# Patient Record
Sex: Female | Born: 1935 | Race: White | Hispanic: No | Smoking: Never smoker
Health system: Southern US, Community
[De-identification: ages and names within clinical notes are randomized; demographics above are authoritative.]

## PROBLEM LIST (undated history)

## (undated) DIAGNOSIS — F32A Depression, unspecified: Secondary | ICD-10-CM

## (undated) DIAGNOSIS — Z974 Presence of external hearing-aid: Secondary | ICD-10-CM

## (undated) DIAGNOSIS — I10 Essential (primary) hypertension: Secondary | ICD-10-CM

## (undated) DIAGNOSIS — I8390 Asymptomatic varicose veins of unspecified lower extremity: Secondary | ICD-10-CM

## (undated) DIAGNOSIS — F329 Major depressive disorder, single episode, unspecified: Secondary | ICD-10-CM

## (undated) DIAGNOSIS — Z972 Presence of dental prosthetic device (complete) (partial): Secondary | ICD-10-CM

## (undated) DIAGNOSIS — E039 Hypothyroidism, unspecified: Secondary | ICD-10-CM

## (undated) DIAGNOSIS — I5189 Other ill-defined heart diseases: Secondary | ICD-10-CM

## (undated) DIAGNOSIS — H3530 Unspecified macular degeneration: Secondary | ICD-10-CM

## (undated) DIAGNOSIS — M1990 Unspecified osteoarthritis, unspecified site: Secondary | ICD-10-CM

## (undated) DIAGNOSIS — I351 Nonrheumatic aortic (valve) insufficiency: Secondary | ICD-10-CM

## (undated) DIAGNOSIS — K219 Gastro-esophageal reflux disease without esophagitis: Secondary | ICD-10-CM

## (undated) DIAGNOSIS — I2510 Atherosclerotic heart disease of native coronary artery without angina pectoris: Secondary | ICD-10-CM

## (undated) DIAGNOSIS — D649 Anemia, unspecified: Secondary | ICD-10-CM

## (undated) DIAGNOSIS — E785 Hyperlipidemia, unspecified: Secondary | ICD-10-CM

## (undated) HISTORY — DX: Anemia, unspecified: D64.9

## (undated) HISTORY — PX: HEMORRHOID SURGERY: SHX153

## (undated) HISTORY — DX: Hyperlipidemia, unspecified: E78.5

## (undated) HISTORY — DX: Depression, unspecified: F32.A

## (undated) HISTORY — DX: Nonrheumatic aortic (valve) insufficiency: I35.1

## (undated) HISTORY — PX: VARICOSE VEIN SURGERY: SHX832

## (undated) HISTORY — DX: Essential (primary) hypertension: I10

## (undated) HISTORY — DX: Unspecified macular degeneration: H35.30

## (undated) HISTORY — DX: Asymptomatic varicose veins of unspecified lower extremity: I83.90

## (undated) HISTORY — DX: Major depressive disorder, single episode, unspecified: F32.9

---

## 2004-11-28 ENCOUNTER — Ambulatory Visit: Payer: Self-pay | Admitting: Internal Medicine

## 2004-12-18 ENCOUNTER — Ambulatory Visit: Payer: Self-pay | Admitting: Internal Medicine

## 2005-06-26 ENCOUNTER — Ambulatory Visit: Payer: Self-pay | Admitting: Internal Medicine

## 2005-12-05 ENCOUNTER — Ambulatory Visit: Payer: Self-pay | Admitting: Unknown Physician Specialty

## 2005-12-06 ENCOUNTER — Ambulatory Visit: Payer: Self-pay | Admitting: Internal Medicine

## 2006-01-01 ENCOUNTER — Ambulatory Visit: Payer: Self-pay | Admitting: Unknown Physician Specialty

## 2006-12-10 ENCOUNTER — Ambulatory Visit: Payer: Self-pay | Admitting: Internal Medicine

## 2007-07-15 ENCOUNTER — Other Ambulatory Visit: Payer: Self-pay

## 2007-07-15 ENCOUNTER — Emergency Department: Payer: Self-pay | Admitting: Emergency Medicine

## 2007-07-16 ENCOUNTER — Ambulatory Visit: Payer: Self-pay | Admitting: Internal Medicine

## 2007-12-14 ENCOUNTER — Ambulatory Visit: Payer: Self-pay | Admitting: Internal Medicine

## 2009-01-17 ENCOUNTER — Ambulatory Visit: Payer: Self-pay | Admitting: Internal Medicine

## 2009-04-16 ENCOUNTER — Ambulatory Visit: Payer: Self-pay | Admitting: Family Medicine

## 2010-01-24 ENCOUNTER — Ambulatory Visit: Payer: Self-pay | Admitting: Internal Medicine

## 2011-02-27 ENCOUNTER — Ambulatory Visit (INDEPENDENT_AMBULATORY_CARE_PROVIDER_SITE_OTHER): Payer: MEDICARE | Admitting: Internal Medicine

## 2011-02-27 ENCOUNTER — Encounter: Payer: Self-pay | Admitting: Internal Medicine

## 2011-02-27 VITALS — BP 136/80 | HR 76 | Temp 98.1°F | Resp 16 | Ht 62.0 in | Wt 194.5 lb

## 2011-02-27 DIAGNOSIS — E079 Disorder of thyroid, unspecified: Secondary | ICD-10-CM

## 2011-02-27 DIAGNOSIS — I10 Essential (primary) hypertension: Secondary | ICD-10-CM

## 2011-02-27 DIAGNOSIS — H3530 Unspecified macular degeneration: Secondary | ICD-10-CM | POA: Insufficient documentation

## 2011-02-27 DIAGNOSIS — F32A Depression, unspecified: Secondary | ICD-10-CM | POA: Insufficient documentation

## 2011-02-27 DIAGNOSIS — M549 Dorsalgia, unspecified: Secondary | ICD-10-CM

## 2011-02-27 DIAGNOSIS — Z23 Encounter for immunization: Secondary | ICD-10-CM

## 2011-02-27 DIAGNOSIS — Z79899 Other long term (current) drug therapy: Secondary | ICD-10-CM

## 2011-02-27 DIAGNOSIS — Z1239 Encounter for other screening for malignant neoplasm of breast: Secondary | ICD-10-CM

## 2011-02-27 DIAGNOSIS — F329 Major depressive disorder, single episode, unspecified: Secondary | ICD-10-CM | POA: Insufficient documentation

## 2011-02-27 DIAGNOSIS — E875 Hyperkalemia: Secondary | ICD-10-CM

## 2011-02-27 DIAGNOSIS — E039 Hypothyroidism, unspecified: Secondary | ICD-10-CM

## 2011-02-27 DIAGNOSIS — E785 Hyperlipidemia, unspecified: Secondary | ICD-10-CM

## 2011-02-27 LAB — COMPREHENSIVE METABOLIC PANEL
BUN: 28 mg/dL — ABNORMAL HIGH (ref 6–23)
CO2: 30 mEq/L (ref 19–32)
Calcium: 9.3 mg/dL (ref 8.4–10.5)
Chloride: 102 mEq/L (ref 96–112)
Creatinine, Ser: 0.9 mg/dL (ref 0.4–1.2)
GFR: 65.9 mL/min (ref >60.00)
Total Bilirubin: 0.7 mg/dL (ref 0.3–1.2)

## 2011-02-27 LAB — LDL CHOLESTEROL, DIRECT: Direct LDL: 104.4 mg/dL

## 2011-02-27 MED ORDER — CYCLOBENZAPRINE HCL 10 MG PO TABS: 10.0000 mg | ORAL_TABLET | Freq: Every evening | ORAL | Status: DC | PRN

## 2011-02-27 NOTE — Patient Instructions (Addendum)
Try to start a walking program 25 minutes daily at least 5 days per week.    I will call in a prescription for flexeril for back spasm which you may use at night   We will order your mammogram.  Come back in 6 months for your annual physical.    Take 1/2 of celexa every day for 2 weeks, then 1/2 every other day for 2 weeks then stop  If you can't cut it in half,  Take 1 tablet every other day for 2 weeks, then every 3 days for 2 weeks then stop.

## 2011-02-27 NOTE — Progress Notes (Signed)
  Subjective:    Patient ID: Jenna Marshall, female    DOB: 12/14/1924, 77 y.o.   MRN: [id number]  HPI  77 yo white female with history of hypertension and depression presents for followup .she has been eating out regularly with a group of fellow widows and feels she no longer needs an antidepressant and would like to discontinue it. She has not been able to lose any weight but is not exercising or walking regularly.  No current outpatient prescriptions on file prior to visit.   .  Review of Systems  Constitutional: Negative for fever, chills and unexpected weight change.  HENT: Negative for hearing loss, ear pain, nosebleeds, congestion, sore throat, facial swelling, rhinorrhea, sneezing, mouth sores, trouble swallowing, neck pain, neck stiffness, voice change, postnasal drip, sinus pressure, tinnitus and ear discharge.   Eyes: Negative for pain, discharge, redness and visual disturbance.  Respiratory: Negative for cough, chest tightness, shortness of breath, wheezing and stridor.   Cardiovascular: Negative for chest pain, palpitations and leg swelling.  Musculoskeletal: Negative for myalgias and arthralgias.  Skin: Negative for color change and rash.  Neurological: Negative for dizziness, weakness, light-headedness and headaches.  Hematological: Negative for adenopathy.   BP 136/80  Pulse 76  Temp(Src) 98.1 F (36.7 C) (Oral)  Resp 16  Ht 5\' 2"  (1.575 m)  Wt 194 lb 8 oz (88.225 kg)  BMI 35.57 kg/m2  SpO2 95%     Objective:   Physical Exam  Constitutional: She is oriented to person, place, and time. She appears well-developed and well-nourished.  HENT:  Mouth/Throat: Oropharynx is clear and moist.  Eyes: EOM are normal. Pupils are equal, round, and reactive to light. No scleral icterus.  Neck: Normal range of motion. Neck supple. No JVD present. No thyromegaly present.  Cardiovascular: Normal rate, regular rhythm, normal heart sounds and intact distal pulses.   Pulmonary/Chest:  Effort normal and breath sounds normal.  Abdominal: Soft. Bowel sounds are normal. She exhibits no mass. There is no tenderness.  Musculoskeletal: Normal range of motion. She exhibits no edema.  Lymphadenopathy:    She has no cervical adenopathy.  Neurological: She is alert and oriented to person, place, and time.  Skin: Skin is warm and dry.  Psychiatric: She has a normal mood and affect.          Assessment & Plan:

## 2011-02-27 NOTE — Assessment & Plan Note (Signed)
Controlled on current Synthroid level

## 2011-02-27 NOTE — Assessment & Plan Note (Signed)
Controlled on current regimen.   

## 2011-02-27 NOTE — Assessment & Plan Note (Signed)
Managed with statin. Repeat lipids due.

## 2011-03-01 LAB — COMPREHENSIVE METABOLIC PANEL
ALT: 13 U/L (ref 0–35)
AST: 26 U/L (ref 0–37)
Albumin: 4.1 g/dL (ref 3.5–5.2)
Alkaline Phosphatase: 45 U/L (ref 39–117)
Chloride: 101 mEq/L (ref 96–112)
Potassium: 4.6 mEq/L (ref 3.5–5.3)
Sodium: 137 mEq/L (ref 135–145)
Total Protein: 6.9 g/dL (ref 6.0–8.3)

## 2011-03-01 NOTE — Progress Notes (Signed)
Addended by: Jobie Quaker on: 03/01/2011 03:39 PM   Modules accepted: Orders

## 2011-03-13 ENCOUNTER — Encounter: Payer: Self-pay | Admitting: Internal Medicine

## 2011-03-28 ENCOUNTER — Other Ambulatory Visit: Payer: Self-pay | Admitting: Internal Medicine

## 2011-03-29 MED ORDER — SIMVASTATIN 40 MG PO TABS: 40.0000 mg | ORAL_TABLET | Freq: Every day | ORAL | Status: DC

## 2011-04-03 ENCOUNTER — Other Ambulatory Visit: Payer: Self-pay | Admitting: Internal Medicine

## 2011-04-03 MED ORDER — LEVOTHYROXINE SODIUM 25 MCG PO TABS: 25.0000 ug | ORAL_TABLET | Freq: Every day | ORAL | Status: DC

## 2011-05-05 ENCOUNTER — Ambulatory Visit: Payer: Self-pay | Admitting: Internal Medicine

## 2011-05-23 ENCOUNTER — Ambulatory Visit: Payer: Self-pay | Admitting: Internal Medicine

## 2011-05-30 ENCOUNTER — Other Ambulatory Visit: Payer: Self-pay | Admitting: Internal Medicine

## 2011-05-30 DIAGNOSIS — E538 Deficiency of other specified B group vitamins: Secondary | ICD-10-CM

## 2011-05-30 MED ORDER — CYANOCOBALAMIN 1000 MCG/ML IJ SOLN: 1000.0000 ug | INTRAMUSCULAR | Status: DC

## 2011-07-19 ENCOUNTER — Other Ambulatory Visit: Payer: Self-pay | Admitting: *Deleted

## 2011-07-19 MED ORDER — LOSARTAN POTASSIUM 100 MG PO TABS: 100.0000 mg | ORAL_TABLET | Freq: Every day | ORAL | Status: DC

## 2011-07-25 ENCOUNTER — Other Ambulatory Visit: Payer: Self-pay | Admitting: *Deleted

## 2011-07-25 MED ORDER — SIMVASTATIN 40 MG PO TABS: 40.0000 mg | ORAL_TABLET | Freq: Every day | ORAL | Status: DC

## 2011-07-26 MED ORDER — ALPRAZOLAM 0.25 MG PO TABS: 0.2500 mg | ORAL_TABLET | Freq: Every evening | ORAL | Status: AC | PRN

## 2011-08-01 ENCOUNTER — Other Ambulatory Visit: Payer: Self-pay | Admitting: Internal Medicine

## 2011-08-01 MED ORDER — LEVOTHYROXINE SODIUM 25 MCG PO TABS: 25.0000 ug | ORAL_TABLET | Freq: Every day | ORAL | Status: DC

## 2011-08-28 ENCOUNTER — Ambulatory Visit (INDEPENDENT_AMBULATORY_CARE_PROVIDER_SITE_OTHER): Payer: MEDICARE | Admitting: Internal Medicine

## 2011-08-28 ENCOUNTER — Encounter: Payer: Self-pay | Admitting: Internal Medicine

## 2011-08-28 VITALS — BP 148/70 | HR 82 | Temp 98.0°F | Resp 16 | Wt 199.5 lb

## 2011-08-28 DIAGNOSIS — E669 Obesity, unspecified: Secondary | ICD-10-CM

## 2011-08-28 DIAGNOSIS — Z0000 Encounter for general adult medical examination without abnormal findings: Secondary | ICD-10-CM

## 2011-08-28 DIAGNOSIS — E785 Hyperlipidemia, unspecified: Secondary | ICD-10-CM

## 2011-08-28 DIAGNOSIS — E039 Hypothyroidism, unspecified: Secondary | ICD-10-CM

## 2011-08-28 DIAGNOSIS — I10 Essential (primary) hypertension: Secondary | ICD-10-CM

## 2011-08-28 DIAGNOSIS — F3289 Other specified depressive episodes: Secondary | ICD-10-CM

## 2011-08-28 DIAGNOSIS — F329 Major depressive disorder, single episode, unspecified: Secondary | ICD-10-CM

## 2011-08-28 MED ORDER — ZOSTER VACCINE LIVE 19400 UNT/0.65ML ~~LOC~~ SOLR: 0.6500 mL | Freq: Once | SUBCUTANEOUS | Status: AC

## 2011-08-28 NOTE — Patient Instructions (Addendum)
Consider the Low Glycemic Index Diet and 5 or 6 smaller meals daily :   8 AM  Consider eating eggs and breakfast meat,  Or  toasted Sandwhich Thin w/ peanut butter or cheese )9  Lunch: sandwich on pita bread or flatbread (Joseph's makes a pita bread and a flat bread , available at [employer] and [employer]; Toufayah makes a low carb flatbread available at [employer] and [employer])   [employer] makes  low carb whole wheat tortilla available everywhere   3 PM:  Mid afternoon :  An Atkins snack size protein bar, or a Special K bar,   Or a  cheese stick, 1/4 cup of almonds, walnuts, pistachios, pecans, peanuts,  Macadamia nuts  Or a cup of greek yogurt .  If you eat  Fruit as your snack,  Add a piece of cheese or handful of nuts to add protein .    6 PM  Dinner:   Broiled or baked  Meat/chicken/fish,  A green salad, and steamed vegetable  : use ranch, vinagrette,  Blue cheese, etc.  Avoid fat free dressing because they are full of sugar  9 PM snack : Breyer's low carb fudgiscle or  ice cream bar (Carb Smart) Weight Watcher's ice cream bar ,  Please return for fasting blood work next week.   I recommend you get a DTaP (tentatnus and pertussis vaccine)  At the health Dept.  I am giving you a script for the shingles ,  You can [facility] T [facility],  [facility],  [place]

## 2011-08-28 NOTE — Progress Notes (Signed)
Patient ID: Jenna Marshall, female   DOB: 12/14/1924, 78 y.o.   MRN: [id number]    Patient Active Problem List  Diagnoses  . Macular degeneration  . Hyperlipidemia  . Hypertension  . Hypothyroidism  . Depression    Subjective:  CC:   Chief Complaint  Patient presents with  . Annual Exam    HPI:   Jenna Marshall a 78 y.o. female who presents  for annual Medicare wellness examination and management of other chronic and acute problems.   The risk factors are reflected in the social history.  The roster of all physicians providing medical care to patient - is listed in the Snapshot section of the chart.  Activities of daily living:  The patient is 100% independent in all ADLs: dressing, toileting, feeding as well as independent mobility  Home safety : The patient has smoke detectors in the home. They wear seatbelts.  There are no firearms at home. There is no violence in the home.   There is no risks for hepatitis, STDs or HIV. There is no   history of blood transfusion. They have no travel history to infectious disease endemic areas of the world.  The patient has seen their dentist in the last six month. She has seen their eye doctor repeatedly in the last year for management of macular degeneration.  She has slight hearing difficulty with regard to whispered voices and some television programs and wears a hearing aid in the left ear. She does not  have excessive sun exposure. Discussed the need for sun protection: hats, long sleeves and use of sunscreen if there is significant sun exposure.   Diet: the importance of a healthy diet is discussed. She has a healthy diet.  The benefits of regular aerobic exercise were discussed.  Over the last 3 months she has had 2 episodes of viral syndrome with cough lasting for weeks.  Symptoms finally resolved before Christmas.. Feels very good lately.  Her walking program was interrupted by an episode of left foot and ankle pain and swelling two months  ago which was managed by her podiatrist.   X rays by [facility] were normal.  Treated her with a steroid injection in the dorsolateral side  and a compression stocking and symptoms eventually resolved. Has resumed walking program as of yesterday .      Depression screen: there are no signs or vegative symptoms of worsening depression- irritability, change in appetite, anhedonia, sadness/tearfullness. All symptoms have improved with medications.  Cognitive assessment: the patient manages all their financial and personal affairs and is actively engaged. They could relate day,date,year and events; recalled 2/3 objects at 3 minutes; performed clock-face test normally.  The following portions of the patient's history were reviewed and updated as appropriate: allergies, current medications, past family history, past medical history,  past surgical history, past social history  and problem list.    During the course of the visit the patient was educated and counseled about appropriate screening and preventive services including : fall prevention , diabetes screening, nutrition counseling, colorectal cancer screening, and recommended immunizations.    Past Medical History  Diagnosis Date  . Macular degeneration   . Hyperlipidemia   . Hypertension   . Thyroid disease   . Depression   . pernicious anemia     History reviewed. No pertinent past surgical history.       The following portions of the patient's history were reviewed and updated as appropriate: Allergies, current medications, and problem list.  Review of Systems:   12 Pt  review of systems was negative except those addressed in the HPI,     History   Social History  . Marital Status: Widowed    Spouse Name: N/A    Number of Children: N/A  . Years of Education: N/A   Occupational History  . Not on file.   Social History Main Topics  . Smoking status: Never Smoker   . Smokeless tobacco: Never Used  . Alcohol Use: No    . Drug Use: No  . Sexually Active: No   Other Topics Concern  . Not on file   Social History Narrative  . No narrative on file    Objective:  BP 148/70  Pulse 82  Temp(Src) 98 F (36.7 C) (Oral)  Resp 16  Wt 199 lb 8 oz (90.493 kg)  SpO2 96%  General appearance: alert, cooperative and appears stated age Ears: normal TM's and external ear canals both ears Throat: lips, mucosa, and tongue normal; teeth and gums normal Neck: no adenopathy, no carotid bruit, supple, symmetrical, trachea midline and thyroid not enlarged, symmetric, no tenderness/mass/nodules Back: symmetric, no curvature. ROM normal. No CVA tenderness. Lungs: clear to auscultation bilaterally Heart: regular rate and rhythm, S1, S2 normal, no murmur, click, rub or gallop Abdomen: soft, non-tender; bowel sounds normal; no masses,  no organomegaly Pulses: 2+ and symmetric Skin: Skin color, texture, turgor normal. No rashes or lesions Lymph nodes: Cervical, supraclavicular, and axillary nodes normal. Visual acuity was not assessed per patient preference since she has regular follow up with her ophthalmologist Hearing was  assessed using her hearing aid .    Assessment and Plan:  Hypertension Well-controlled for her age. She is due for retesting of renal function. No changes today.  Hyperlipidemia Well-controlled on current regimen with last LDL 104 in September 2012. No changes today. Liver function tests are due.  Hypothyroidism TSH was 1.42 in September 2012 on current dose. Recheck is due.  Depression Mild with symptoms currently in remission. Diagnosis resulted from prolonged period and after the loss of her husband last year. She is sleeping well,  interacting socially, and her weight is stable.    Updated Medication List Outpatient Encounter Prescriptions as of 08/28/2011  Medication Sig Dispense Refill  . aspirin 81 MG tablet Take 81 mg by mouth daily.        . Calcium Carbonate-Vitamin D  (CALCIUM + D) 600-200 MG-UNIT TABS Take by mouth.        . cyanocobalamin (,VITAMIN B-12,) 1000 MCG/ML injection Inject 1 mL (1,000 mcg total) into the muscle every 30 (thirty) days.  10 mL  1  . levothyroxine (SYNTHROID, LEVOTHROID) 25 MCG tablet Take 1 tablet (25 mcg total) by mouth daily.  30 tablet  3  . losartan (COZAAR) 100 MG tablet Take 1 tablet (100 mg total) by mouth daily.  30 tablet  3  . Multiple Vitamin (MULTIVITAMIN) tablet Take 1 tablet by mouth daily.        . Multiple Vitamins-Minerals (PRESERVISION AREDS PO) Take by mouth 2 (two) times daily.        Marland Kitchen oxybutynin (DITROPAN-XL) 10 MG 24 hr tablet Take 10 mg by mouth daily.        . simvastatin (ZOCOR) 40 MG tablet Take 1 tablet (40 mg total) by mouth at bedtime.  30 tablet  3  . DISCONTD: citalopram (CELEXA) 20 MG tablet Take 20 mg by mouth daily.        Marland Kitchen  zoster vaccine live, PF, (ZOSTAVAX) 16109 UNT/0.65ML injection Inject 19,400 Units into the skin once.  1 each  0  . DISCONTD: cyclobenzaprine (FLEXERIL) 10 MG tablet Take 1 tablet (10 mg total) by mouth at bedtime as needed for muscle spasms.  30 tablet  1     Orders Placed This Encounter  Procedures  . MyChart Weight Flowsheet    Return in about 6 months (around 02/28/2012).

## 2011-09-01 ENCOUNTER — Encounter: Payer: Self-pay | Admitting: Internal Medicine

## 2011-09-01 DIAGNOSIS — E669 Obesity, unspecified: Secondary | ICD-10-CM | POA: Insufficient documentation

## 2011-09-01 NOTE — Assessment & Plan Note (Signed)
Well-controlled for her age. She is due for retesting of renal function. No changes today.

## 2011-09-01 NOTE — Assessment & Plan Note (Signed)
Well-controlled on current regimen with last LDL 104 in September 2012. No changes today. Liver function tests are due.

## 2011-09-01 NOTE — Assessment & Plan Note (Signed)
I have addressed  BMI and recommended a low glycemic index diet utilizing smaller more frequent meals to increase metabolism.  I have also encouraged  patient to maintain her daily walking with a goal of 30 minutes of aerobic exercise a minimum of 5 days per week.

## 2011-09-01 NOTE — Assessment & Plan Note (Signed)
Mild with symptoms currently in remission. Diagnosis resulted from prolonged period and after the loss of her husband last year. She is sleeping well,  interacting socially, and her weight is stable.

## 2011-09-01 NOTE — Assessment & Plan Note (Signed)
TSH was 1.42 in September 2012 on current dose. Recheck is due.

## 2011-09-11 ENCOUNTER — Other Ambulatory Visit (INDEPENDENT_AMBULATORY_CARE_PROVIDER_SITE_OTHER): Payer: MEDICARE | Admitting: *Deleted

## 2011-09-11 ENCOUNTER — Telehealth: Payer: Self-pay | Admitting: *Deleted

## 2011-09-11 DIAGNOSIS — I10 Essential (primary) hypertension: Secondary | ICD-10-CM

## 2011-09-11 DIAGNOSIS — E785 Hyperlipidemia, unspecified: Secondary | ICD-10-CM

## 2011-09-11 DIAGNOSIS — E079 Disorder of thyroid, unspecified: Secondary | ICD-10-CM

## 2011-09-11 LAB — COMPREHENSIVE METABOLIC PANEL
ALT: 14 U/L (ref 0–35)
Albumin: 4.1 g/dL (ref 3.5–5.2)
CO2: 27 mEq/L (ref 19–32)
Calcium: 9.2 mg/dL (ref 8.4–10.5)
Chloride: 102 mEq/L (ref 96–112)
GFR: 60.19 mL/min (ref >60.00)
Potassium: 4.4 mEq/L (ref 3.5–5.1)
Sodium: 140 mEq/L (ref 135–145)
Total Protein: 6.8 g/dL (ref 6.0–8.3)

## 2011-09-11 LAB — LIPID PANEL: VLDL: 21.4 mg/dL (ref 0.0–40.0)

## 2011-09-11 LAB — TSH: TSH: 2.32 u[IU]/mL (ref 0.35–5.50)

## 2011-09-11 NOTE — Telephone Encounter (Signed)
Patient came in for labs today, but there is no order. I did see that she was told with her last lab results from 02-29-12 that she was to return for a BMET that week, but she never did so I drew for the BMET. I just wanted to make sure that there was nothing else she wanted to add.

## 2011-09-11 NOTE — Telephone Encounter (Signed)
TSH, CMET and fasting lipids. Do what you can...Marland KitchenMarland Kitchen

## 2011-09-11 NOTE — Telephone Encounter (Signed)
All labs ordered.

## 2011-11-22 ENCOUNTER — Other Ambulatory Visit: Payer: Self-pay | Admitting: *Deleted

## 2011-11-22 MED ORDER — SIMVASTATIN 40 MG PO TABS: 40.0000 mg | ORAL_TABLET | Freq: Every day | ORAL | Status: DC

## 2011-11-25 ENCOUNTER — Other Ambulatory Visit: Payer: Self-pay | Admitting: *Deleted

## 2011-11-25 MED ORDER — LOSARTAN POTASSIUM 100 MG PO TABS: 100.0000 mg | ORAL_TABLET | Freq: Every day | ORAL | Status: DC

## 2011-11-28 ENCOUNTER — Other Ambulatory Visit: Payer: Self-pay | Admitting: *Deleted

## 2011-11-28 MED ORDER — LEVOTHYROXINE SODIUM 25 MCG PO TABS: 25.0000 ug | ORAL_TABLET | Freq: Every day | ORAL | Status: DC

## 2011-12-30 ENCOUNTER — Other Ambulatory Visit: Payer: Self-pay | Admitting: *Deleted

## 2011-12-30 MED ORDER — OXYBUTYNIN CHLORIDE ER 10 MG PO TB24: 10.0000 mg | ORAL_TABLET | Freq: Every day | ORAL | Status: DC

## 2012-03-04 ENCOUNTER — Ambulatory Visit (INDEPENDENT_AMBULATORY_CARE_PROVIDER_SITE_OTHER): Payer: MEDICARE | Admitting: Internal Medicine

## 2012-03-04 ENCOUNTER — Encounter: Payer: Self-pay | Admitting: Internal Medicine

## 2012-03-04 VITALS — BP 142/80 | HR 70 | Temp 97.5°F | Ht 61.5 in | Wt 197.0 lb

## 2012-03-04 DIAGNOSIS — Z23 Encounter for immunization: Secondary | ICD-10-CM

## 2012-03-04 DIAGNOSIS — E039 Hypothyroidism, unspecified: Secondary | ICD-10-CM

## 2012-03-04 DIAGNOSIS — I10 Essential (primary) hypertension: Secondary | ICD-10-CM

## 2012-03-04 DIAGNOSIS — E669 Obesity, unspecified: Secondary | ICD-10-CM

## 2012-03-04 DIAGNOSIS — E785 Hyperlipidemia, unspecified: Secondary | ICD-10-CM

## 2012-03-04 MED ORDER — TRAMADOL HCL 50 MG PO TABS: 50.0000 mg | ORAL_TABLET | Freq: Four times a day (QID) | ORAL | Status: DC | PRN

## 2012-03-04 NOTE — Patient Instructions (Addendum)
You may take the tramadol  Up to three times daily if needed for pain, and it may be combined with tylenol ,  Ibuprofen or alleve.   Return at your leisure for fasting blood work

## 2012-03-04 NOTE — Progress Notes (Signed)
Patient ID: Jenna Marshall, female   DOB: 12/14/1924, 78 y.o.   MRN: [id number]   Patient Active Problem List  Diagnosis  . Macular degeneration  . Hyperlipidemia  . Hypertension  . Hypothyroidism  . Depression  . Obesity (BMI 30-39.9)    Subjective:  CC:   Chief Complaint  Patient presents with  . Follow-up    flu shot    HPI:   Jenna Marshall a 78 y.o. female who presents 6 month Follow up on hypertension , hypothyroidism hyperlipidemia and depression. We address her obesity at her last visit and she has been trying to walk more frequently and longer distances but notices that after 15 or 20 minutes her legs get wobbly and back pain starts to become rate limiting. She recently tried a dose of Tylenol for pain control, and given to her by her son and tolerated his medication well without any side effects. She is requesting her a prescription for it. She has no history of of lumbar radiculopathy and no prior history of surgery or falls. She is sleeping well. No urinary or bowel incontinence. Using Ditropan for her urinary frequency with good results. she has no new issues today.   Past Medical History  Diagnosis Date  . Macular degeneration   . Hyperlipidemia   . Hypertension   . Thyroid disease   . Depression   . pernicious anemia     History reviewed. No pertinent past surgical history.       The following portions of the patient's history were reviewed and updated as appropriate: Allergies, current medications, and problem list.    Review of Systems:  A comprehensive ROS was done and positive for back pain and leg weakness with prolonged walking .  The rest was negative.      History   Social History  . Marital Status: Widowed    Spouse Name: N/A    Number of Children: N/A  . Years of Education: N/A   Occupational History  . Not on file.   Social History Main Topics  . Smoking status: Never Smoker   . Smokeless tobacco: Never Used  . Alcohol Use: No    . Drug Use: No  . Sexually Active: No   Other Topics Concern  . Not on file   Social History Narrative  . No narrative on file    Objective:  BP 142/80  Pulse 70  Temp 97.5 F (36.4 C) (Oral)  Ht 5' 1.5" (1.562 m)  Wt 197 lb (89.359 kg)  BMI 36.62 kg/m2  SpO2 96%  General appearance: alert, cooperative and appears stated age Neck: no adenopathy, no carotid bruit, supple, symmetrical, trachea midline and thyroid not enlarged, symmetric, no tenderness/mass/nodules Back: symmetric, no curvature. ROM normal. No CVA tenderness. Lungs: clear to auscultation bilaterally Heart: regular rate and rhythm, S1, S2 normal, no murmur, click, rub or gallop Abdomen: soft, non-tender; bowel sounds normal; no masses,  no organomegaly Pulses: 2+ and symmetric Skin: Skin color, texture, turgor normal. No rashes or lesions Lymph nodes: Cervical, supraclavicular, and axillary nodes normal. Back:  No vertebral tenderness. ROM average for age.   Assessment and Plan:  Hyperlipidemia LDL was 90 and HDL is 46 on current dose of simvastatin per April 2013 labs. Repeat have been ordered along with liver function tests. She is tolerating the medication without any muscle aches or nausea.  Hypertension Currently well controlled on losartan 100 mg daily.  Hypothyroidism Managed with  Obesity (BMI 30-39.9) Her BMI of  36 was addressed at last visit. She is trying to increase her activity level is limited by her back pain. Low glycemic index diet printed given to patient today. She has no history of sleep apnea or heart failure. Recommended trying the water aerobics classes at the [employer] [facility].   Updated Medication List Outpatient Encounter Prescriptions as of 03/04/2012  Medication Sig Dispense Refill  . aspirin 81 MG tablet Take 81 mg by mouth daily.        . Calcium Carbonate-Vitamin D (CALCIUM + D) 600-200 MG-UNIT TABS Take by mouth.        . cyanocobalamin (,VITAMIN B-12,) 1000  MCG/ML injection Inject 1 mL (1,000 mcg total) into the muscle every 30 (thirty) days.  10 mL  1  . levothyroxine (SYNTHROID, LEVOTHROID) 25 MCG tablet Take 1 tablet (25 mcg total) by mouth daily.  30 tablet  6  . losartan (COZAAR) 100 MG tablet Take 1 tablet (100 mg total) by mouth daily.  30 tablet  3  . Multiple Vitamin (MULTIVITAMIN) tablet Take 1 tablet by mouth daily.        . Multiple Vitamins-Minerals (PRESERVISION AREDS PO) Take by mouth 2 (two) times daily.        Marland Kitchen oxybutynin (DITROPAN-XL) 10 MG 24 hr tablet Take 1 tablet (10 mg total) by mouth daily.  30 tablet  2  . simvastatin (ZOCOR) 40 MG tablet Take 1 tablet (40 mg total) by mouth at bedtime.  30 tablet  3  . traMADol (ULTRAM) 50 MG tablet Take 1 tablet (50 mg total) by mouth every 6 (six) hours as needed for pain.  90 tablet  3     Orders Placed This Encounter  Procedures  . Flu vaccine greater than or equal to 3yo preservative free IM  . Lipid panel  . Comprehensive metabolic panel    Return in about 6 months (around 09/02/2012).

## 2012-03-05 ENCOUNTER — Encounter: Payer: Self-pay | Admitting: Internal Medicine

## 2012-03-05 NOTE — Assessment & Plan Note (Signed)
Managed with  

## 2012-03-05 NOTE — Assessment & Plan Note (Signed)
LDL was 90 and HDL is 46 on current dose of simvastatin per April 2013 labs. Repeat have been ordered along with liver function tests. She is tolerating the medication without any muscle aches or nausea.

## 2012-03-05 NOTE — Assessment & Plan Note (Signed)
Her BMI of 36 was addressed at last visit. She is trying to increase her activity level is limited by her back pain. Low glycemic index diet printed given to patient today. She has no history of sleep apnea or heart failure. Recommended trying the water aerobics classes at the [employer] [facility].

## 2012-03-05 NOTE — Assessment & Plan Note (Signed)
Currently well controlled on losartan 100 mg daily.

## 2012-03-11 ENCOUNTER — Other Ambulatory Visit (INDEPENDENT_AMBULATORY_CARE_PROVIDER_SITE_OTHER): Payer: MEDICARE

## 2012-03-11 DIAGNOSIS — E785 Hyperlipidemia, unspecified: Secondary | ICD-10-CM

## 2012-03-11 LAB — COMPREHENSIVE METABOLIC PANEL
ALT: 14 U/L (ref 0–35)
AST: 19 U/L (ref 0–37)
Albumin: 3.6 g/dL (ref 3.5–5.2)
Alkaline Phosphatase: 43 U/L (ref 39–117)
BUN: 22 mg/dL (ref 6–23)
Potassium: 4.3 mEq/L (ref 3.5–5.1)
Sodium: 140 mEq/L (ref 135–145)

## 2012-03-11 LAB — LIPID PANEL
Cholesterol: 143 mg/dL (ref 0–200)
LDL Cholesterol: 82 mg/dL (ref 0–99)
Total CHOL/HDL Ratio: 3
VLDL: 19 mg/dL (ref 0.0–40.0)

## 2012-03-25 ENCOUNTER — Other Ambulatory Visit: Payer: Self-pay | Admitting: *Deleted

## 2012-03-25 MED ORDER — SIMVASTATIN 40 MG PO TABS: 40.0000 mg | ORAL_TABLET | Freq: Every day | ORAL | Status: DC

## 2012-03-25 MED ORDER — LOSARTAN POTASSIUM 100 MG PO TABS: 100.0000 mg | ORAL_TABLET | Freq: Every day | ORAL | Status: DC

## 2012-05-28 ENCOUNTER — Ambulatory Visit: Payer: Self-pay | Admitting: Internal Medicine

## 2012-06-29 ENCOUNTER — Other Ambulatory Visit: Payer: Self-pay | Admitting: *Deleted

## 2012-06-29 MED ORDER — LEVOTHYROXINE SODIUM 25 MCG PO TABS: 25.0000 ug | ORAL_TABLET | Freq: Every day | ORAL | Status: DC

## 2012-06-29 NOTE — Telephone Encounter (Signed)
Med filled.  

## 2012-09-02 ENCOUNTER — Ambulatory Visit: Payer: Self-pay | Admitting: Internal Medicine

## 2012-09-02 ENCOUNTER — Encounter: Payer: Self-pay | Admitting: Internal Medicine

## 2012-09-02 ENCOUNTER — Ambulatory Visit (INDEPENDENT_AMBULATORY_CARE_PROVIDER_SITE_OTHER): Payer: MEDICARE | Admitting: Internal Medicine

## 2012-09-02 VITALS — BP 138/68 | HR 70 | Temp 97.6°F | Resp 18 | Wt 196.0 lb

## 2012-09-02 DIAGNOSIS — Z8619 Personal history of other infectious and parasitic diseases: Secondary | ICD-10-CM

## 2012-09-02 DIAGNOSIS — M25569 Pain in unspecified knee: Secondary | ICD-10-CM

## 2012-09-02 DIAGNOSIS — M25469 Effusion, unspecified knee: Secondary | ICD-10-CM

## 2012-09-02 DIAGNOSIS — E039 Hypothyroidism, unspecified: Secondary | ICD-10-CM

## 2012-09-02 DIAGNOSIS — E669 Obesity, unspecified: Secondary | ICD-10-CM

## 2012-09-02 DIAGNOSIS — M25561 Pain in right knee: Secondary | ICD-10-CM

## 2012-09-02 DIAGNOSIS — Z86718 Personal history of other venous thrombosis and embolism: Secondary | ICD-10-CM | POA: Insufficient documentation

## 2012-09-02 DIAGNOSIS — M25461 Effusion, right knee: Secondary | ICD-10-CM

## 2012-09-02 DIAGNOSIS — I10 Essential (primary) hypertension: Secondary | ICD-10-CM

## 2012-09-02 DIAGNOSIS — E559 Vitamin D deficiency, unspecified: Secondary | ICD-10-CM

## 2012-09-02 DIAGNOSIS — E785 Hyperlipidemia, unspecified: Secondary | ICD-10-CM

## 2012-09-02 DIAGNOSIS — Z79899 Other long term (current) drug therapy: Secondary | ICD-10-CM

## 2012-09-02 LAB — LIPID PANEL
Cholesterol: 175 mg/dL (ref 0–200)
HDL: 46.7 mg/dL (ref >39.00)
Triglycerides: 114 mg/dL (ref 0.0–149.0)
VLDL: 22.8 mg/dL (ref 0.0–40.0)

## 2012-09-02 LAB — COMPREHENSIVE METABOLIC PANEL
ALT: 16 U/L (ref 0–35)
AST: 20 U/L (ref 0–37)
Alkaline Phosphatase: 57 U/L (ref 39–117)
BUN: 25 mg/dL — ABNORMAL HIGH (ref 6–23)
Creatinine, Ser: 0.9 mg/dL (ref 0.4–1.2)
Potassium: 4.3 mEq/L (ref 3.5–5.1)

## 2012-09-02 LAB — D-DIMER, QUANTITATIVE: D-Dimer, Quant: 0.27 ug/mL-FEU (ref 0.00–0.48)

## 2012-09-02 MED ORDER — DICLOFENAC POTASSIUM 50 MG PO TABS: 50.0000 mg | ORAL_TABLET | Freq: Three times a day (TID) | ORAL | Status: DC

## 2012-09-02 NOTE — Progress Notes (Signed)
Patient ID: Jenna Marshall, female   DOB: 1926/01/15, 79 y.o.   MRN: [id number]   Patient Active Problem List  Diagnosis  . Macular degeneration  . Hyperlipidemia  . Hypertension  . Hypothyroidism  . Obesity (BMI 30-39.9)  . History of shingles  . History of DVT of lower extremity  . Right knee pain    Subjective:  CC:   Chief Complaint  Patient presents with  . Follow-up    HPI:   Jenna Marshall a 79 y.o. female who presents  Past Medical History  Diagnosis Date  . Macular degeneration   . Hyperlipidemia   . Hypertension   . Thyroid disease   . Depression   . pernicious anemia     History reviewed. No pertinent past surgical history.     The following portions of the patient's history were reviewed and updated as appropriate: Allergies, current medications, and problem list.    Review of Systems:   12 Pt  review of systems was negative except those addressed in the HPI,     History   Social History  . Marital Status: Widowed    Spouse Name: N/A    Number of Children: N/A  . Years of Education: N/A   Occupational History  . Not on file.   Social History Main Topics  . Smoking status: Never Smoker   . Smokeless tobacco: Never Used  . Alcohol Use: No  . Drug Use: No  . Sexually Active: No   Other Topics Concern  . Not on file   Social History Narrative  . No narrative on file    Objective:  BP 138/68  Pulse 70  Temp(Src) 97.6 F (36.4 C) (Oral)  Resp 18  Wt 196 lb (88.905 kg)  BMI 36.44 kg/m2  SpO2 97%  General appearance: alert, cooperative and appears stated age Ears: normal TM's and external ear canals both ears Throat: lips, mucosa, and tongue normal; teeth and gums normal Neck: no adenopathy, no carotid bruit, supple, symmetrical, trachea midline and thyroid not enlarged, symmetric, no tenderness/mass/nodules Back: symmetric, no curvature. ROM normal. No CVA tenderness. Lungs: clear to auscultation bilaterally Heart: regular  rate and rhythm, S1, S2 normal, no murmur, click, rub or gallop Abdomen: soft, non-tender; bowel sounds normal; no masses,  no organomegaly Pulses: 2+ and symmetric Skin: Skin color, texture, turgor normal. No rashes or lesions Lymph nodes: Cervical, supraclavicular, and axillary nodes normal. MSK: right leg notable for popliteal swelling. And tenderness. Negative Homans sign.  Assessment and Plan:  Right knee pain Primary concern was DVT given her symptoms and history of unprovoked DVT of the left. D-dimer was normal. She  was referred for ultrasound of right leg  Which was  normal and no Baker cyst was noted either. Will recommend that she try taking 2 ibuprofen and Tylenol an hour before walking he get back on her walking program if she can tolerate it the pain persists we will obtain x-rays to rule out degenerative joint disease.  Hypothyroidism Thyroid function is normal on current dose. No changes today   Hyperlipidemia LDL and triglycerides are at goal on current medications. She has no side effects and liver enzymes are normal. No changes today   Hypertension Well controlled on current regimen. Renal function stable, no changes today.  Obesity (BMI 30-39.9) Her BMI remains unchanged .  She has not been exercising lately due to the winter months and ankle are weather. Her current knee pain appears to be due to OA  or DJD. DVT was ruled out.  I also am advising her to continue low glycemic index diet and resume daily walking.   A total of 40 minutes was spent with patient more than half of which was spent in counseling, reviewing reports  from other providers and coordination of care. Updated Medication List Outpatient Encounter Prescriptions as of 09/02/2012  Medication Sig Dispense Refill  . aspirin 81 MG tablet Take 81 mg by mouth daily.        . Calcium Carbonate-Vitamin D (CALCIUM + D) 600-200 MG-UNIT TABS Take by mouth.        . cyanocobalamin (,VITAMIN B-12,) 1000 MCG/ML  injection Inject 1 mL (1,000 mcg total) into the muscle every 30 (thirty) days.  10 mL  1  . levothyroxine (SYNTHROID, LEVOTHROID) 25 MCG tablet Take 1 tablet (25 mcg total) by mouth daily.  30 tablet  6  . losartan (COZAAR) 100 MG tablet Take 1 tablet (100 mg total) by mouth daily.  30 tablet  6  . Multiple Vitamin (MULTIVITAMIN) tablet Take 1 tablet by mouth daily.        . Multiple Vitamins-Minerals (PRESERVISION AREDS PO) Take by mouth 2 (two) times daily.        Marland Kitchen oxybutynin (DITROPAN-XL) 10 MG 24 hr tablet Take 1 tablet (10 mg total) by mouth daily.  30 tablet  2  . simvastatin (ZOCOR) 40 MG tablet Take 1 tablet (40 mg total) by mouth at bedtime.  30 tablet  6  . traMADol (ULTRAM) 50 MG tablet Take 1 tablet (50 mg total) by mouth every 6 (six) hours as needed for pain.  90 tablet  3  . diclofenac (CATAFLAM) 50 MG tablet Take 1 tablet (50 mg total) by mouth 3 (three) times daily.  60 tablet  2   No facility-administered encounter medications on file as of 09/02/2012.     Orders Placed This Encounter  Procedures  . D-dimer, quantitative  . Comprehensive metabolic panel  . TSH  . Vitamin D 25 hydroxy  . Lipid panel  . Lower Extremity Venous Duplex Right    No Follow-up on file.

## 2012-09-02 NOTE — Patient Instructions (Addendum)
I am sending your for an ultrasound of your right leg to evaluate your pain and swelling and rule out a blood clot.   I am prescribing diclofenac  to take twice daily for arthritis .  If it causes legsg swelling or elebated blood pressure, let me know  Labs today  Return in 3 months for your wellness exam

## 2012-09-03 ENCOUNTER — Encounter: Payer: Self-pay | Admitting: Internal Medicine

## 2012-09-03 ENCOUNTER — Telehealth: Payer: Self-pay | Admitting: Internal Medicine

## 2012-09-03 DIAGNOSIS — M25561 Pain in right knee: Secondary | ICD-10-CM | POA: Insufficient documentation

## 2012-09-03 LAB — VITAMIN D 25 HYDROXY (VIT D DEFICIENCY, FRACTURES): Vit D, 25-Hydroxy: 41 ng/mL (ref 30–89)

## 2012-09-03 NOTE — Assessment & Plan Note (Signed)
LDL and triglycerides are at goal on current medications. She has no side effects and liver enzymes are normal. No changes today.  

## 2012-09-03 NOTE — Assessment & Plan Note (Signed)
Well controlled on current regimen. Renal function stable, no changes today. 

## 2012-09-03 NOTE — Assessment & Plan Note (Signed)
Thyroid function is normal on current dose. No changes today

## 2012-09-03 NOTE — Assessment & Plan Note (Signed)
Her BMI remains unchanged .  She has not been exercising lately due to the winter months and ankle are weather. Her current knee pain appears to be due to OA or DJD. DVT was ruled out.  I also am advising her to continue low glycemic index diet and resume daily walking.

## 2012-09-03 NOTE — Assessment & Plan Note (Addendum)
Primary concern was DVT given her symptoms and history of unprovoked DVT of the left. D-dimer was normal. She  was referred for ultrasound of right leg  Which was  normal and no Baker cyst was noted either. Will recommend that she try taking 2 ibuprofen and Tylenol an hour before walking he get back on her walking program if she can tolerate it the pain persists we will obtain x-rays to rule out degenerative joint disease.

## 2012-09-03 NOTE — Telephone Encounter (Signed)
1) She  was referred for ultrasound of right leg  Which was  normal and no Baker cyst was noted either. Will recommend that she try taking 2 ibuprofen and Tylenol an hour before walking he get back on her walking program if she can tolerate it the pain persists we will obtain x-rays to rule out degenerative joint disease.  2) All labs normal,. Including thyroid function and cholesterol

## 2012-09-07 NOTE — Telephone Encounter (Signed)
Pt.notified

## 2012-09-17 ENCOUNTER — Telehealth: Payer: Self-pay

## 2012-09-17 NOTE — Telephone Encounter (Signed)
Called in the meloxicam 15 mg #90 with no refills to [facility]

## 2012-09-17 NOTE — Telephone Encounter (Signed)
Diclofenac should not make her sleepy, but tramadol may do that.  Find out if she is taking tramadol .  If not,  We can try meloxicam 15 mg daily  Qty 90  No refills.,. And you can call it in as a substitute for diclefenac

## 2012-09-17 NOTE — Telephone Encounter (Signed)
Patient is not taking the Tramadol anymore. I will call in the meloxicam 15mg  #90 with no refills to [facility].

## 2012-09-17 NOTE — Telephone Encounter (Signed)
Please Advise....Marland KitchenMarland KitchenPt called office and stated that the medication Diclofenac (Cataflam) has helped her arthritis but it makes her very sleepy. Pt wants to know is there another medication she can take.

## 2012-09-23 ENCOUNTER — Encounter: Payer: Self-pay | Admitting: Internal Medicine

## 2012-09-28 ENCOUNTER — Other Ambulatory Visit: Payer: Self-pay | Admitting: *Deleted

## 2012-09-28 MED ORDER — OXYBUTYNIN CHLORIDE ER 10 MG PO TB24: 10.0000 mg | ORAL_TABLET | Freq: Every day | ORAL | Status: DC

## 2012-10-21 ENCOUNTER — Other Ambulatory Visit: Payer: Self-pay | Admitting: *Deleted

## 2012-10-21 MED ORDER — SIMVASTATIN 40 MG PO TABS: 40.0000 mg | ORAL_TABLET | Freq: Every day | ORAL | Status: DC

## 2012-10-21 NOTE — Telephone Encounter (Signed)
Rx sent to pharmacy by escript  

## 2012-10-27 ENCOUNTER — Other Ambulatory Visit: Payer: Self-pay | Admitting: *Deleted

## 2012-10-27 MED ORDER — LOSARTAN POTASSIUM 100 MG PO TABS: 100.0000 mg | ORAL_TABLET | Freq: Every day | ORAL | Status: DC

## 2012-10-30 ENCOUNTER — Ambulatory Visit: Payer: Self-pay | Admitting: Emergency Medicine

## 2012-11-04 ENCOUNTER — Ambulatory Visit (INDEPENDENT_AMBULATORY_CARE_PROVIDER_SITE_OTHER): Payer: Medicare Other | Admitting: Internal Medicine

## 2012-11-04 ENCOUNTER — Encounter: Payer: Self-pay | Admitting: Internal Medicine

## 2012-11-04 VITALS — BP 164/72 | HR 79 | Temp 98.3°F | Resp 14 | Wt 189.8 lb

## 2012-11-04 DIAGNOSIS — I80232 Phlebitis and thrombophlebitis of left tibial vein: Secondary | ICD-10-CM

## 2012-11-04 DIAGNOSIS — I80299 Phlebitis and thrombophlebitis of other deep vessels of unspecified lower extremity: Secondary | ICD-10-CM

## 2012-11-04 MED ORDER — CEPHALEXIN 500 MG PO TABS: 500.0000 mg | ORAL_TABLET | Freq: Three times a day (TID) | ORAL | Status: DC

## 2012-11-04 NOTE — Patient Instructions (Addendum)
I am treating your for phlebitis   Please take cephalexin antibiotic for 7 days  Resume diclofenac  , your anti inflammatory evey 8 hours   can add tramadol  50 mg every 6 hours for pain relief   Elevate the leg when you are sitting  Use cool compresses for 15 minutes  At a time   Continue your baby aspirin daily

## 2012-11-04 NOTE — Progress Notes (Signed)
Patient ID: Jenna Marshall, female   DOB: 01-Sep-1925, 79 y.o.   MRN: [id number]   Patient Active Problem List   Diagnosis Date Noted  . Phlebitis of anterior tibial vein 11/06/2012  . Right knee pain 09/03/2012  . History of shingles 09/02/2012  . History of DVT of lower extremity 09/02/2012  . Obesity (BMI 30-39.9) 09/01/2011  . Macular degeneration   . Hyperlipidemia   . Hypertension   . Hypothyroidism     Subjective:  CC:   Chief Complaint  Patient presents with  . Follow-up    soreness in left leg movement causes pain X 5 days , seen at urgent care x-ray taken neg. for injury reported sent to [facility] for ultra sound for clot patient reports no clot seen.    HPI:   Jenna Marshall a 79 y.o. female who presents with persistent left lower leg pain in the anterior portion for the last 5 days. Patient has been recently evaluated by coronal urgent care with bone films and ultrasound which were negative for fractures lytic lesions and DVTs. She has no history of trauma to the leg. No recent travel. She does have a history of varicose veins.    Past Medical History  Diagnosis Date  . Macular degeneration   . Hyperlipidemia   . Hypertension   . Thyroid disease   . Depression   . pernicious anemia   . Varicose veins     History reviewed. No pertinent past surgical history.     The following portions of the patient's history were reviewed and updated as appropriate: Allergies, current medications, and problem list.    Review of Systems:   Patient denies headache, fevers, malaise, unintentional weight loss, skin rash, eye pain, sinus congestion and sinus pain, sore throat, dysphagia,  hemoptysis , cough, dyspnea, wheezing, chest pain, palpitations, orthopnea, edema, abdominal pain, nausea, melena, diarrhea, constipation, flank pain, dysuria, hematuria, urinary  Frequency, nocturia, numbness, tingling, seizures,  Focal weakness, Loss of consciousness,  Tremor, insomnia, depression,  anxiety, and suicidal ideation.        History   Social History  . Marital Status: Widowed    Spouse Name: N/A    Number of Children: N/A  . Years of Education: N/A   Occupational History  . Not on file.   Social History Main Topics  . Smoking status: Never Smoker   . Smokeless tobacco: Never Used  . Alcohol Use: No  . Drug Use: No  . Sexually Active: No   Other Topics Concern  . Not on file   Social History Narrative  . No narrative on file    Objective:  BP 164/72  Pulse 79  Temp(Src) 98.3 F (36.8 C) (Oral)  Resp 14  Wt 189 lb 12 oz (86.07 kg)  BMI 35.28 kg/m2  SpO2 97%  General appearance: alert, cooperative and appears stated age Ears: normal TM's and external ear canals both ears Throat: lips, mucosa, and tongue normal; teeth and gums normal Neck: no adenopathy, no carotid bruit, supple, symmetrical, trachea midline and thyroid not enlarged, symmetric, no tenderness/mass/nodules Back: symmetric, no curvature. ROM normal. No CVA tenderness. Lungs: clear to auscultation bilaterally Heart: regular rate and rhythm, S1, S2 normal, no murmur, click, rub or gallop Abdomen: soft, non-tender; bowel sounds normal; no masses,  no organomegaly Pulses: 2+ and symmetric Skin: Skin color, texture, turgor normal. No rashes or lesions. Tender anteriorly LLE, varicsoe veins noted.  Lymph nodes: Cervical, supraclavicular, and axillary nodes normal.  Assessment and  Plan:  Phlebitis of anterior tibial vein She has had plain films and lower extremity Doppler done at [facility] , which I have reviewed online.  There is no evidence of fracture lytic lesions or DVT. She is tender over several anteriorly located superficial varicose veins. Will treat with empicially for phlebitis with Keflex and anti-inflammatory and follow up in one week.  A total of 25 minutes was spent with patient and son, more than half of which was spent in counseling, and reviewing records from other providers   Updated Medication List Outpatient Encounter Prescriptions as of 11/04/2012  Medication Sig Dispense Refill  . aspirin 81 MG tablet Take 81 mg by mouth daily.        . Calcium Carbonate-Vitamin D (CALCIUM + D) 600-200 MG-UNIT TABS Take by mouth.        . cyanocobalamin (,VITAMIN B-12,) 1000 MCG/ML injection Inject 1 mL (1,000 mcg total) into the muscle every 30 (thirty) days.  10 mL  1  . levothyroxine (SYNTHROID, LEVOTHROID) 25 MCG tablet Take 1 tablet (25 mcg total) by mouth daily.  30 tablet  6  . losartan (COZAAR) 100 MG tablet Take 1 tablet (100 mg total) by mouth daily.  30 tablet  5  . Multiple Vitamin (MULTIVITAMIN) tablet Take 1 tablet by mouth daily.        . Multiple Vitamins-Minerals (PRESERVISION AREDS PO) Take by mouth 2 (two) times daily.        Marland Kitchen oxybutynin (DITROPAN-XL) 10 MG 24 hr tablet Take 1 tablet (10 mg total) by mouth daily.  30 tablet  2  . simvastatin (ZOCOR) 40 MG tablet Take 1 tablet (40 mg total) by mouth at bedtime.  30 tablet  5  . traMADol (ULTRAM) 50 MG tablet Take 1 tablet (50 mg total) by mouth every 6 (six) hours as needed for pain.  90 tablet  3  . Cephalexin 500 MG tablet Take 1 tablet (500 mg total) by mouth 3 (three) times daily.  21 tablet  0  . diclofenac (CATAFLAM) 50 MG tablet Take 1 tablet (50 mg total) by mouth 3 (three) times daily.  60 tablet  2   No facility-administered encounter medications on file as of 11/04/2012.     No orders of the defined types were placed in this encounter.    No Follow-up on file.

## 2012-11-06 ENCOUNTER — Encounter: Payer: Self-pay | Admitting: Internal Medicine

## 2012-11-06 DIAGNOSIS — I80239 Phlebitis and thrombophlebitis of unspecified tibial vein: Secondary | ICD-10-CM | POA: Insufficient documentation

## 2012-11-06 NOTE — Assessment & Plan Note (Signed)
She has had plain films and lower extremity Doppler to rule out DVT. She is tender over several anteriorly located superficial varicose veins. Will treat with Keflex and anti-inflammatory and follow up in one week.

## 2012-11-13 ENCOUNTER — Ambulatory Visit (INDEPENDENT_AMBULATORY_CARE_PROVIDER_SITE_OTHER): Payer: Medicare Other | Admitting: Internal Medicine

## 2012-11-13 ENCOUNTER — Encounter: Payer: Self-pay | Admitting: Internal Medicine

## 2012-11-13 ENCOUNTER — Ambulatory Visit: Payer: Medicare Other | Admitting: Internal Medicine

## 2012-11-13 VITALS — BP 140/68 | HR 91 | Temp 97.8°F | Resp 16 | Wt 198.0 lb

## 2012-11-13 DIAGNOSIS — I80299 Phlebitis and thrombophlebitis of other deep vessels of unspecified lower extremity: Secondary | ICD-10-CM

## 2012-11-13 DIAGNOSIS — I83893 Varicose veins of bilateral lower extremities with other complications: Secondary | ICD-10-CM

## 2012-11-13 DIAGNOSIS — I809 Phlebitis and thrombophlebitis of unspecified site: Secondary | ICD-10-CM

## 2012-11-13 DIAGNOSIS — I80232 Phlebitis and thrombophlebitis of left tibial vein: Secondary | ICD-10-CM

## 2012-11-13 NOTE — Patient Instructions (Addendum)
You can continue to use the diclofenac as needed for infammation or pain   Check with [facility] about compression stockings

## 2012-11-15 NOTE — Assessment & Plan Note (Signed)
She has had plain films and lower extremity Doppler to rule out DVT. She was tender over several anteriorly located superficial varicose veins. Symptoms have resolved with  Empiric treatment with Keflex and anti-inflammatory .  She can contineu rn use of NSAID and was advised to elevate legs and use compression stockings to manage varcisoe veins as much as possible.

## 2012-11-15 NOTE — Progress Notes (Signed)
Patient ID: Jenna Marshall, female   DOB: 1925/10/25, 79 y.o.   MRN: [id number]  Follow up on recent diagnosis of phlebitis without DVT.  She presented last week with persistent left lower leg pain in the anterior portion for the last 5 days. Patient has been recently evaluated by coronal urgent care with bone films and ultrasound which were negative for fractures lytic lesions and DVTs. She has no history of trauma to the leg. No recent travel. She does have a history of varicose veins. She was treated with empiric keflex for phlebitis and symptoms have improved markedly.    Past Medical History  Diagnosis Date  . Macular degeneration   . Hyperlipidemia   . Hypertension   . Thyroid disease   . Depression   . pernicious anemia   . Varicose veins     No past surgical history on file.   The following portions of the patient's history were reviewed and updated as appropriate: Allergies, current medications, and problem list.    Review of Systems:   Patient denies headache, fevers, malaise, unintentional weight loss, skin rash, eye pain, sinus congestion and sinus pain, sore throat, dysphagia,  hemoptysis , cough, dyspnea, wheezing, chest pain, palpitations, orthopnea, edema, abdominal pain, nausea, melena, diarrhea, constipation, flank pain, dysuria, hematuria, urinary  Frequency, nocturia, numbness, tingling, seizures,  Focal weakness, Loss of consciousness,  Tremor, insomnia, depression, anxiety, and suicidal ideation.        History   Social History  . Marital Status: Widowed    Spouse Name: N/A    Number of Children: N/A  . Years of Education: N/A   Occupational History  . Not on file.   Social History Main Topics  . Smoking status: Never Smoker   . Smokeless tobacco: Never Used  . Alcohol Use: No  . Drug Use: No  . Sexually Active: No   Other Topics Concern  . Not on file   Social History Narrative  . No narrative on file    Objective:  BP 140/68  Pulse 91   Temp(Src) 97.8 F (36.6 C) (Oral)  Resp 16  Wt 198 lb (89.812 kg)  BMI 36.81 kg/m2  SpO2 96%  General appearance: alert, cooperative and appears stated age Ears: normal TM's and external ear canals both ears Throat: lips, mucosa, and tongue normal; teeth and gums normal Neck: no adenopathy, no carotid bruit, supple, symmetrical, trachea midline and thyroid not enlarged, symmetric, no tenderness/mass/nodules Back: symmetric, no curvature. ROM normal. No CVA tenderness. Lungs: clear to auscultation bilaterally Heart: regular rate and rhythm, S1, S2 normal, no murmur, click, rub or gallop Abdomen: soft, non-tender; bowel sounds normal; no masses,  no organomegaly Pulses: 2+ and symmetric Skin: Skin color, texture, turgor normal. No rashes or lesions. Resolving tendernessanteriorly LLE, varicose veins noted.  Lymph nodes: Cervical, supraclavicular, and axillary nodes normal.  Assessment and Plan:  Phlebitis of anterior tibial vein She has had plain films and lower extremity Doppler to rule out DVT. She was tender over several anteriorly located superficial varicose veins. Symptoms have resolved with  Empiric treatment with Keflex and anti-inflammatory .  She can contineu rn use of NSAID and was advised to elevate legs and use compression stockings to manage varcisoe veins as much as possible.    Updated Medication List Outpatient Encounter Prescriptions as of 11/13/2012  Medication Sig Dispense Refill  . aspirin 81 MG tablet Take 81 mg by mouth daily.        . Calcium Carbonate-Vitamin D (  CALCIUM + D) 600-200 MG-UNIT TABS Take by mouth.        . cyanocobalamin (,VITAMIN B-12,) 1000 MCG/ML injection Inject 1 mL (1,000 mcg total) into the muscle every 30 (thirty) days.  10 mL  1  . diclofenac (CATAFLAM) 50 MG tablet Take 1 tablet (50 mg total) by mouth 3 (three) times daily.  60 tablet  2  . levothyroxine (SYNTHROID, LEVOTHROID) 25 MCG tablet Take 1 tablet (25 mcg total) by mouth daily.  30  tablet  6  . losartan (COZAAR) 100 MG tablet Take 1 tablet (100 mg total) by mouth daily.  30 tablet  5  . Multiple Vitamin (MULTIVITAMIN) tablet Take 1 tablet by mouth daily.        . Multiple Vitamins-Minerals (PRESERVISION AREDS PO) Take by mouth 2 (two) times daily.        Marland Kitchen oxybutynin (DITROPAN-XL) 10 MG 24 hr tablet Take 1 tablet (10 mg total) by mouth daily.  30 tablet  2  . simvastatin (ZOCOR) 40 MG tablet Take 1 tablet (40 mg total) by mouth at bedtime.  30 tablet  5  . traMADol (ULTRAM) 50 MG tablet Take 1 tablet (50 mg total) by mouth every 6 (six) hours as needed for pain.  90 tablet  3  . Cephalexin 500 MG tablet Take 1 tablet (500 mg total) by mouth 3 (three) times daily.  21 tablet  0   No facility-administered encounter medications on file as of 11/13/2012.

## 2012-11-30 ENCOUNTER — Telehealth: Payer: Self-pay | Admitting: *Deleted

## 2012-11-30 NOTE — Telephone Encounter (Signed)
Refill Request  Meloxicam 15 mg tab  Take 1 tablet by mouth each day

## 2012-12-01 NOTE — Telephone Encounter (Signed)
We have tried both,  Refill whichever she feels works better on her joint pain but advise her not to take both

## 2012-12-01 NOTE — Telephone Encounter (Signed)
Do not see where you have prescribed Meloxicam script for diclofenac 50 mg please advise.

## 2012-12-02 ENCOUNTER — Other Ambulatory Visit: Payer: Self-pay | Admitting: *Deleted

## 2012-12-02 NOTE — Telephone Encounter (Signed)
Left message for pt to return my call.

## 2012-12-03 MED ORDER — MELOXICAM 15 MG PO TABS: 15.0000 mg | ORAL_TABLET | Freq: Every day | ORAL | Status: DC

## 2012-12-03 NOTE — Telephone Encounter (Signed)
Patient called wanting to know about refill status called patient to verify med and left message for patient to return call.

## 2012-12-03 NOTE — Addendum Note (Signed)
Addended by: Dennie Bible on: 12/03/2012 09:51 AM   Modules accepted: Orders

## 2012-12-03 NOTE — Telephone Encounter (Signed)
Patient would like meloxicam filled

## 2012-12-03 NOTE — Telephone Encounter (Signed)
Medication filled as requested electronically.

## 2013-01-26 ENCOUNTER — Other Ambulatory Visit: Payer: Self-pay | Admitting: *Deleted

## 2013-01-26 MED ORDER — LEVOTHYROXINE SODIUM 25 MCG PO TABS: 25.0000 ug | ORAL_TABLET | Freq: Every day | ORAL | Status: DC

## 2013-03-04 ENCOUNTER — Other Ambulatory Visit: Payer: Self-pay | Admitting: *Deleted

## 2013-03-04 ENCOUNTER — Ambulatory Visit: Payer: Self-pay

## 2013-03-04 NOTE — Telephone Encounter (Signed)
Last visit 11/13/12, refill?

## 2013-03-05 MED ORDER — TRAMADOL HCL 50 MG PO TABS: 50.0000 mg | ORAL_TABLET | Freq: Four times a day (QID) | ORAL | Status: DC | PRN

## 2013-03-09 ENCOUNTER — Encounter: Payer: Self-pay | Admitting: *Deleted

## 2013-03-10 ENCOUNTER — Encounter: Payer: Self-pay | Admitting: Emergency Medicine

## 2013-03-10 ENCOUNTER — Encounter: Payer: Self-pay | Admitting: Internal Medicine

## 2013-03-10 ENCOUNTER — Ambulatory Visit (INDEPENDENT_AMBULATORY_CARE_PROVIDER_SITE_OTHER): Payer: Medicare Other | Admitting: Internal Medicine

## 2013-03-10 VITALS — BP 176/82 | HR 71 | Temp 98.3°F | Resp 14 | Ht 61.5 in | Wt 191.5 lb

## 2013-03-10 DIAGNOSIS — E669 Obesity, unspecified: Secondary | ICD-10-CM

## 2013-03-10 DIAGNOSIS — E785 Hyperlipidemia, unspecified: Secondary | ICD-10-CM

## 2013-03-10 DIAGNOSIS — I10 Essential (primary) hypertension: Secondary | ICD-10-CM

## 2013-03-10 DIAGNOSIS — E039 Hypothyroidism, unspecified: Secondary | ICD-10-CM

## 2013-03-10 DIAGNOSIS — Z79899 Other long term (current) drug therapy: Secondary | ICD-10-CM

## 2013-03-10 DIAGNOSIS — M810 Age-related osteoporosis without current pathological fracture: Secondary | ICD-10-CM

## 2013-03-10 DIAGNOSIS — E559 Vitamin D deficiency, unspecified: Secondary | ICD-10-CM

## 2013-03-10 LAB — COMPREHENSIVE METABOLIC PANEL
ALT: 15 U/L (ref 0–35)
AST: 16 U/L (ref 0–37)
Alkaline Phosphatase: 49 U/L (ref 39–117)
Chloride: 105 mEq/L (ref 96–112)
Creatinine, Ser: 0.9 mg/dL (ref 0.4–1.2)
Total Bilirubin: 0.7 mg/dL (ref 0.3–1.2)

## 2013-03-10 LAB — CBC WITH DIFFERENTIAL/PLATELET
Basophils Relative: 0.3 % (ref 0.0–3.0)
Eosinophils Absolute: 0.1 10*3/uL (ref 0.0–0.7)
Eosinophils Relative: 0.7 % (ref 0.0–5.0)
HCT: 38.5 % (ref 36.0–46.0)
Hemoglobin: 12.9 g/dL (ref 12.0–15.0)
Lymphs Abs: 2.1 10*3/uL (ref 0.7–4.0)
MCHC: 33.6 g/dL (ref 30.0–36.0)
MCV: 93.9 fl (ref 78.0–100.0)
Monocytes Absolute: 0.8 10*3/uL (ref 0.1–1.0)
Neutro Abs: 9.2 10*3/uL — ABNORMAL HIGH (ref 1.4–7.7)
RBC: 4.1 Mil/uL (ref 3.87–5.11)

## 2013-03-10 LAB — LIPID PANEL
HDL: 56.4 mg/dL (ref >39.00)
LDL Cholesterol: 117 mg/dL — ABNORMAL HIGH (ref 0–99)
Total CHOL/HDL Ratio: 3
VLDL: 20.4 mg/dL (ref 0.0–40.0)

## 2013-03-10 LAB — TSH: TSH: 1.23 u[IU]/mL (ref 0.35–5.50)

## 2013-03-10 MED ORDER — TETANUS-DIPHTH-ACELL PERTUSSIS 5-2.5-18.5 LF-MCG/0.5 IM SUSP: 0.5000 mL | Freq: Once | INTRAMUSCULAR | Status: DC

## 2013-03-10 NOTE — Assessment & Plan Note (Addendum)
Elevated today, but she is taking prednisone and celebrex.  Reviewed her list of meds and advised her to stop all NSAIDS, finish the predniosn and return in two weeks for BP check

## 2013-03-10 NOTE — Progress Notes (Signed)
Patient ID: Jenna Marshall, female   DOB: [birth year], 79 y.o.   MRN: [id number]   Patient Active Problem List   Diagnosis Date Noted  . Encounter for long-term (current) use of other medications 03/12/2013  . Right knee pain 09/03/2012  . History of shingles 09/02/2012  . History of DVT of lower extremity 09/02/2012  . Obesity (BMI 30-39.9) 09/01/2011  . Macular degeneration   . Hyperlipidemia   . Hypertension   . Hypothyroidism     Subjective:  CC:   Chief Complaint  Patient presents with  . Follow-up    6 month    HPI:   Jenna Marshall a 79 y.o. female who presents for 6 month follow up on chronic conditions including hypertension, hyperlipdemia, hypothyrodism obesity and OA .  She  feels good today, and has no new issues.  She was, however,  treated last week at Urgent Care for right leg pain which was attributed to muscle strain brought on by excessive yardwork.  She has a histroyf of DVT in same leg,  And and ultrsound was done. She was also treated  for poison ivy with a prednisone taper .  Has been taking celebrex as well, and her medicaiton list also has meloxicam and diclofenac as active, but she is not sure if she is taking either of them at the moment.  She denies stomach pain , and her leg pai nhas improved. .   Has lost 7 lbs intentinally since last visit.  Diet reviewed.  She has eliminated potates and bread and is trying to walk for a minimum of 20 minutes daily.    Past Medical History  Diagnosis Date  . Macular degeneration   . Hyperlipidemia   . Hypertension   . Thyroid disease   . Depression   . pernicious anemia   . Varicose veins     History reviewed. No pertinent past surgical history.     The following portions of the patient's history were reviewed and updated as appropriate: Allergies, current medications, and problem list.    Review of Systems:  Patient denies headache, fevers, malaise, unintentional weight loss, skin rash, eye pain, sinus  congestion and sinus pain, sore throat, dysphagia,  hemoptysis , cough, dyspnea, wheezing, chest pain, palpitations, orthopnea, edema, abdominal pain, nausea, melena, diarrhea, constipation, flank pain, dysuria, hematuria, urinary  Frequency, nocturia, numbness, tingling, seizures,  Focal weakness, Loss of consciousness,  Tremor, insomnia, depression, anxiety, and suicidal ideation.     History   Social History  . Marital Status: Widowed    Spouse Name: N/A    Number of Children: N/A  . Years of Education: N/A   Occupational History  . Not on file.   Social History Main Topics  . Smoking status: Never Smoker   . Smokeless tobacco: Never Used  . Alcohol Use: No  . Drug Use: No  . Sexual Activity: No   Other Topics Concern  . Not on file   Social History Narrative  . No narrative on file    Objective:  Filed Vitals:   03/10/13 0854  BP: 176/82  Pulse: 71  Temp: 98.3 F (36.8 C)  Resp: 14     General appearance: alert, cooperative and appears stated age Ears: normal TM's and external ear canals both ears Throat: lips, mucosa, and tongue normal; teeth and gums normal Neck: no adenopathy, no carotid bruit, supple, symmetrical, trachea midline and thyroid not enlarged, symmetric, no tenderness/mass/nodules Back: symmetric, no curvature. ROM normal. No CVA  tenderness. Lungs: clear to auscultation bilaterally Heart: regular rate and rhythm, S1, S2 normal, no murmur, click, rub or gallop Abdomen: soft, non-tender; bowel sounds normal; no masses,  no organomegaly Pulses: 2+ and symmetric Skin: Skin color, texture, turgor normal. No rashes or lesions Lymph nodes: Cervical, supraclavicular, and axillary nodes normal.  Assessment and Plan:  Hypertension Elevated today, but she is taking prednisone and celebrex.  Reviewed her list of meds and advised her to stop all NSAIDS, finish the predniosn and return in two weeks for BP check    Obesity (BMI 30-39.9) Body mass index  is 35.6 kg/(m^2).Marland Kitchen  She has lost 7 lbs since last visit.  Encouragement given and diet reviewed.   Encounter for long-term (current) use of other medications She has been taking prenisone and possible two NSAIDs.  Thankfully her renal function is stable.  Lab Results  Component Value Date   CREATININE 0.9 03/10/2013    Hyperlipidemia  Well controlled on current regimen of simvastatin. Liver enzymes normal, no changes today. Lab Results  Component Value Date   CHOL 194 03/10/2013   HDL 56.40 03/10/2013   LDLCALC 117* 03/10/2013   LDLDIRECT 104.4 02/27/2011   TRIG 102.0 03/10/2013   CHOLHDL 3 03/10/2013     Updated Medication List Outpatient Encounter Prescriptions as of 03/10/2013  Medication Sig Dispense Refill  . aspirin 81 MG tablet Take 81 mg by mouth daily.        . Calcium Carbonate-Vitamin D (CALCIUM + D) 600-200 MG-UNIT TABS Take by mouth.        . cyanocobalamin (,VITAMIN B-12,) 1000 MCG/ML injection Inject 1 mL (1,000 mcg total) into the muscle every 30 (thirty) days.  10 mL  1  . diclofenac (CATAFLAM) 50 MG tablet Take 1 tablet (50 mg total) by mouth 3 (three) times daily.  60 tablet  2  . levothyroxine (SYNTHROID, LEVOTHROID) 25 MCG tablet Take 1 tablet (25 mcg total) by mouth daily.  30 tablet  8  . losartan (COZAAR) 100 MG tablet Take 1 tablet (100 mg total) by mouth daily.  30 tablet  5  . meloxicam (MOBIC) 15 MG tablet Take 1 tablet (15 mg total) by mouth daily.  30 tablet  4  . Multiple Vitamin (MULTIVITAMIN) tablet Take 1 tablet by mouth daily.        . Multiple Vitamins-Minerals (PRESERVISION AREDS PO) Take by mouth 2 (two) times daily.        Marland Kitchen oxybutynin (DITROPAN-XL) 10 MG 24 hr tablet Take 1 tablet (10 mg total) by mouth daily.  30 tablet  2  . predniSONE (DELTASONE) 10 MG tablet Take 3 tablets by mouth daily. taper      . simvastatin (ZOCOR) 40 MG tablet Take 1 tablet (40 mg total) by mouth at bedtime.  30 tablet  5  . traMADol (ULTRAM) 50 MG tablet Take 1 tablet  (50 mg total) by mouth every 6 (six) hours as needed for pain.  90 tablet  3  . CELEBREX 200 MG capsule Take 200 mg by mouth daily.      . TDaP (BOOSTRIX) 5-2.5-18.5 LF-MCG/0.5 injection Inject 0.5 mLs into the muscle once.  0.5 mL  0  . [DISCONTINUED] Cephalexin 500 MG tablet Take 1 tablet (500 mg total) by mouth 3 (three) times daily.  21 tablet  0   No facility-administered encounter medications on file as of 03/10/2013.     Orders Placed This Encounter  Procedures  . DG Bone Density  . CBC  with Differential  . Comprehensive metabolic panel  . TSH  . Lipid panel  . Vit D  25 hydroxy (rtn osteoporosis monitoring)    No Follow-up on file.

## 2013-03-10 NOTE — Patient Instructions (Addendum)
  Do not take celebrex,  Diclofenac,  Or meloxicam any more   You can take the tramadol as needed now and every 6 hours if needed for pain   Return in 2 weeks for your pneumonia and flu vaccines and a repeat blood pressure reading   Your still need your tetanus-diptheria-pertussis vaccine (DAP) but you can get it for less $$$ at a local pharmacy with the script I have provided you.   You can call [facility] directly to schedule your mammogram;  We will schedule your bone density test in December but they need to be on separate days

## 2013-03-12 ENCOUNTER — Encounter: Payer: Self-pay | Admitting: *Deleted

## 2013-03-12 ENCOUNTER — Encounter: Payer: Self-pay | Admitting: Internal Medicine

## 2013-03-12 DIAGNOSIS — Z79899 Other long term (current) drug therapy: Secondary | ICD-10-CM | POA: Insufficient documentation

## 2013-03-12 NOTE — Assessment & Plan Note (Signed)
Lab Results  Component Value Date   CHOL 194 03/10/2013   HDL 56.40 03/10/2013   LDLCALC 117* 03/10/2013   LDLDIRECT 104.4 02/27/2011   TRIG 102.0 03/10/2013   CHOLHDL 3 03/10/2013   She will return in two weeks for fasting lipids

## 2013-03-12 NOTE — Assessment & Plan Note (Signed)
She has been taking prenisone and possible two NSAIDs.  Thankfully her renal function is stable.  Lab Results  Component Value Date   CREATININE 0.9 03/10/2013

## 2013-03-12 NOTE — Assessment & Plan Note (Signed)
Body mass index is 35.6 kg/(m^2).Marland Kitchen  She has lost 7 lbs since last visit.  Encouragement given and diet reviewed.

## 2013-03-20 ENCOUNTER — Emergency Department: Payer: Self-pay | Admitting: Emergency Medicine

## 2013-03-20 LAB — CBC
HGB: 12 g/dL (ref 12.0–16.0)
MCH: 32.3 pg (ref 26.0–34.0)
MCV: 94 fL (ref 80–100)
Platelet: 127 10*3/uL — ABNORMAL LOW (ref 150–440)
RDW: 14.5 % (ref 11.5–14.5)

## 2013-03-20 LAB — COMPREHENSIVE METABOLIC PANEL
Albumin: 3.2 g/dL — ABNORMAL LOW (ref 3.4–5.0)
Alkaline Phosphatase: 66 U/L (ref 50–136)
BUN: 21 mg/dL — ABNORMAL HIGH (ref 7–18)
Calcium, Total: 8.6 mg/dL (ref 8.5–10.1)
Creatinine: 0.99 mg/dL (ref 0.60–1.30)
EGFR (African American): >60
Potassium: 4.3 mmol/L (ref 3.5–5.1)
SGOT(AST): 18 U/L (ref 15–37)
SGPT (ALT): 17 U/L (ref 12–78)
Sodium: 135 mmol/L — ABNORMAL LOW (ref 136–145)
Total Protein: 6.8 g/dL (ref 6.4–8.2)

## 2013-03-20 LAB — URINALYSIS, COMPLETE
Blood: NEGATIVE
Glucose,UR: NEGATIVE mg/dL (ref 0–75)
Nitrite: POSITIVE
Ph: 6 (ref 4.5–8.0)
RBC,UR: 1 /HPF (ref 0–5)
Squamous Epithelial: 1
WBC UR: 12 /HPF (ref 0–5)

## 2013-03-24 ENCOUNTER — Ambulatory Visit: Payer: Medicare Other

## 2013-03-30 ENCOUNTER — Encounter: Payer: Self-pay | Admitting: *Deleted

## 2013-03-31 ENCOUNTER — Ambulatory Visit (INDEPENDENT_AMBULATORY_CARE_PROVIDER_SITE_OTHER): Payer: Medicare Other | Admitting: *Deleted

## 2013-03-31 VITALS — BP 142/78

## 2013-03-31 DIAGNOSIS — Z23 Encounter for immunization: Secondary | ICD-10-CM

## 2013-04-02 ENCOUNTER — Other Ambulatory Visit: Payer: Self-pay | Admitting: *Deleted

## 2013-04-02 MED ORDER — OXYBUTYNIN CHLORIDE ER 10 MG PO TB24: 10.0000 mg | ORAL_TABLET | Freq: Every day | ORAL | Status: DC

## 2013-04-05 ENCOUNTER — Ambulatory Visit: Payer: Self-pay | Admitting: Internal Medicine

## 2013-04-05 ENCOUNTER — Ambulatory Visit (INDEPENDENT_AMBULATORY_CARE_PROVIDER_SITE_OTHER): Payer: Medicare Other | Admitting: Internal Medicine

## 2013-04-05 ENCOUNTER — Encounter: Payer: Self-pay | Admitting: Internal Medicine

## 2013-04-05 VITALS — BP 146/72 | HR 73 | Temp 98.1°F | Resp 14 | Wt 191.5 lb

## 2013-04-05 DIAGNOSIS — M25569 Pain in unspecified knee: Secondary | ICD-10-CM

## 2013-04-05 DIAGNOSIS — M25561 Pain in right knee: Secondary | ICD-10-CM

## 2013-04-05 DIAGNOSIS — M25562 Pain in left knee: Secondary | ICD-10-CM | POA: Insufficient documentation

## 2013-04-05 MED ORDER — TRAMADOL HCL 50 MG PO TABS: 50.0000 mg | ORAL_TABLET | Freq: Four times a day (QID) | ORAL | Status: DC | PRN

## 2013-04-05 MED ORDER — CELECOXIB 200 MG PO CAPS: 200.0000 mg | ORAL_CAPSULE | Freq: Every day | ORAL | Status: DC

## 2013-04-05 NOTE — Patient Instructions (Signed)
For your knee pain: Please take your medications in this combination :   Tramadol 1 pill every every 6 hours  AND   celebrex once daily You can add tylenol if needed for additonal pain contorl 1 tablet every 6 houts    I have ordered Plain x rays of both knees.  I suspect you have lots of degenerative changes   I will place an Orthopedics referral to [facility] (Dr.  Ernest Pine) if the x rays show lots of destruction

## 2013-04-05 NOTE — Assessment & Plan Note (Signed)
Right greater than left,  With exam cosisetn of severe DJD>  Plain films, tramadol and celebrex /tylenol with plans to refer to Dr Ernest Pine at [facility]

## 2013-04-05 NOTE — Progress Notes (Signed)
Patient ID: Jenna Marshall, female   DOB: April 24, 1926, 79 y.o.   MRN: [id number]   Patient Active Problem List   Diagnosis Date Noted  . Knee pain, bilateral 04/05/2013  . Encounter for long-term (current) use of other medications 03/12/2013  . History of shingles 09/02/2012  . History of DVT of lower extremity 09/02/2012  . Obesity (BMI 30-39.9) 09/01/2011  . Macular degeneration   . Hyperlipidemia   . Hypertension   . Hypothyroidism     Subjective:  CC:   Chief Complaint  Patient presents with  . Acute Visit    Leg pain behind right knee, down calf , makes hard to get up.    HPI:   Jenna Marshall a 79 y.o. female who presents persistent knee and calf pain right leg.    2 weeks ago went to ER with a bladder infection.  Could hardly walk at the time.  Marland Kitchenand left knee was imaged with ultrasound and small bakers cyst was noted.   Patient can get around at home bc she has a chair lift but has increased difficulty with cleaning and errand s outside of the home .  Has been using the tramadol sparingly.  Her NSAIDs  were stopped at last visit because she was taking several , but she still has celebrex at home from prior ER visit    Past Medical History  Diagnosis Date  . Macular degeneration   . Hyperlipidemia   . Hypertension   . Thyroid disease   . Depression   . pernicious anemia   . Varicose veins     No past surgical history on file.     The following portions of the patient's history were reviewed and updated as appropriate: Allergies, current medications, and problem list.    Review of Systems:   12 Pt  review of systems was negative except those addressed in the HPI,     History   Social History  . Marital Status: Widowed    Spouse Name: N/A    Number of Children: N/A  . Years of Education: N/A   Occupational History  . Not on file.   Social History Main Topics  . Smoking status: Never Smoker   . Smokeless tobacco: Never Used  . Alcohol Use: No  .  Drug Use: No  . Sexual Activity: No   Other Topics Concern  . Not on file   Social History Narrative  . No narrative on file    Objective:  Filed Vitals:   04/05/13 1533  BP: 146/72  Pulse: 73  Temp: 98.1 F (36.7 C)  Resp: 14     General appearance: alert, cooperative and appears stated age Ears: normal TM's and external ear canals both ears Throat: lips, mucosa, and tongue normal; teeth and gums normal Neck: no adenopathy, no carotid bruit, supple, symmetrical, trachea midline and thyroid not enlarged, symmetric, no tenderness/mass/nodules Back: symmetric, no curvature. ROM normal. No CVA tenderness. Lungs: clear to auscultation bilaterally Heart: regular rate and rhythm, S1, S2 normal, no murmur, click, rub or gallop Abdomen: soft, non-tender; bowel sounds normal; no masses,  no organomegaly Pulses: 2+ and symmetric Skin: Skin color, texture, turgor normal. No rashes or lesions Lymph nodes: Cervical, supraclavicular, and axillary nodes normal.  Assessment and Plan:  Knee pain, bilateral Right greater than left,  With exam cosisetn of severe DJD>  Plain films, tramadol and celebrex /tylenol with plans to refer to Dr Ernest Pine at [facility]   Updated Medication List  Outpatient Encounter Prescriptions as of 04/05/2013  Medication Sig  . aspirin 81 MG tablet Take 81 mg by mouth daily.    . Calcium Carbonate-Vitamin D (CALCIUM + D) 600-200 MG-UNIT TABS Take by mouth.    . cyanocobalamin (,VITAMIN B-12,) 1000 MCG/ML injection Inject 1 mL (1,000 mcg total) into the muscle every 30 (thirty) days.  Marland Kitchen levothyroxine (SYNTHROID, LEVOTHROID) 25 MCG tablet Take 1 tablet (25 mcg total) by mouth daily.  Marland Kitchen losartan (COZAAR) 100 MG tablet Take 1 tablet (100 mg total) by mouth daily.  . Multiple Vitamin (MULTIVITAMIN) tablet Take 1 tablet by mouth daily.    . Multiple Vitamins-Minerals (PRESERVISION AREDS PO) Take by mouth 2 (two) times daily.    Marland Kitchen oxybutynin (DITROPAN-XL) 10 MG 24  hr tablet Take 1 tablet (10 mg total) by mouth daily.  . simvastatin (ZOCOR) 40 MG tablet Take 1 tablet (40 mg total) by mouth at bedtime.  . traMADol (ULTRAM) 50 MG tablet Take 1 tablet (50 mg total) by mouth every 6 (six) hours as needed for pain.  . [DISCONTINUED] traMADol (ULTRAM) 50 MG tablet Take 1 tablet (50 mg total) by mouth every 6 (six) hours as needed for pain.  . celecoxib (CELEBREX) 200 MG capsule Take 1 capsule (200 mg total) by mouth daily.  . TDaP (BOOSTRIX) 5-2.5-18.5 LF-MCG/0.5 injection Inject 0.5 mLs into the muscle once.  . [DISCONTINUED] CELEBREX 200 MG capsule Take 200 mg by mouth daily.  . [DISCONTINUED] celecoxib (CELEBREX) 200 MG capsule Take 200 mg by mouth daily.  . [DISCONTINUED] diclofenac (CATAFLAM) 50 MG tablet Take 1 tablet (50 mg total) by mouth 3 (three) times daily.  . [DISCONTINUED] meloxicam (MOBIC) 15 MG tablet Take 1 tablet (15 mg total) by mouth daily.  . [DISCONTINUED] predniSONE (DELTASONE) 10 MG tablet Take 3 tablets by mouth daily. taper     Orders Placed This Encounter  Procedures  . DG Knee Complete 4 Views Right  . DG Knee Complete 4 Views Left    No Follow-up on file.

## 2013-04-09 ENCOUNTER — Telehealth: Payer: Self-pay | Admitting: Emergency Medicine

## 2013-04-09 DIAGNOSIS — M25569 Pain in unspecified knee: Secondary | ICD-10-CM

## 2013-04-09 NOTE — Telephone Encounter (Signed)
Patient stated she would like to think this over and will notify us on Monday as to decision because of transportation.

## 2013-04-09 NOTE — Telephone Encounter (Signed)
Her knee films showed lots of degenerative and arthritic changes with bone spurs all over the place.  Nothing acute. If she would like to try physical therapy before seeing an orthopedist we can order that

## 2013-04-09 NOTE — Telephone Encounter (Signed)
Pt calling about her xray results. Please advise.

## 2013-04-13 NOTE — Telephone Encounter (Signed)
Patient left voicemail she would like to see Dr. Ernest Pine PA

## 2013-04-13 NOTE — Addendum Note (Signed)
Addended by: Sherlene Shams on: 04/13/2013 12:31 PM   Modules accepted: Orders

## 2013-04-13 NOTE — Telephone Encounter (Signed)
Patient notified referral in process. 

## 2013-04-13 NOTE — Telephone Encounter (Signed)
Referral is in process as requested 

## 2013-04-23 ENCOUNTER — Encounter: Payer: Self-pay | Admitting: Internal Medicine

## 2013-04-26 ENCOUNTER — Other Ambulatory Visit: Payer: Self-pay | Admitting: *Deleted

## 2013-04-26 MED ORDER — SIMVASTATIN 40 MG PO TABS: 40.0000 mg | ORAL_TABLET | Freq: Every day | ORAL | Status: DC

## 2013-04-26 MED ORDER — LOSARTAN POTASSIUM 100 MG PO TABS: 100.0000 mg | ORAL_TABLET | Freq: Every day | ORAL | Status: DC

## 2013-05-11 ENCOUNTER — Ambulatory Visit: Payer: Self-pay | Admitting: Internal Medicine

## 2013-05-13 ENCOUNTER — Telehealth: Payer: Self-pay | Admitting: Internal Medicine

## 2013-05-13 DIAGNOSIS — M8580 Other specified disorders of bone density and structure, unspecified site: Secondary | ICD-10-CM

## 2013-05-13 NOTE — Telephone Encounter (Signed)
I have received the results of her recent bone density test. .  Her bones have improved by recent bone density test. I would continue her calcium vitamin D and weightbearing exercise.

## 2013-05-13 NOTE — Assessment & Plan Note (Signed)
Her T scores have  improved by recent bone density test. I would continue her calcium vitamin D and weightbearing exercise.

## 2013-05-14 NOTE — Telephone Encounter (Signed)
Busy signal could not reach patient.

## 2013-05-21 NOTE — Telephone Encounter (Signed)
Pt notified and verbalized understanding.

## 2013-06-01 ENCOUNTER — Ambulatory Visit: Payer: Self-pay | Admitting: Internal Medicine

## 2013-06-14 ENCOUNTER — Encounter: Payer: Self-pay | Admitting: Internal Medicine

## 2013-06-15 ENCOUNTER — Telehealth: Payer: Self-pay | Admitting: Emergency Medicine

## 2013-06-15 NOTE — Telephone Encounter (Signed)
Referral for [facility] under way for Dr. Marry Guan

## 2013-06-25 ENCOUNTER — Encounter: Payer: Self-pay | Admitting: Internal Medicine

## 2013-06-28 NOTE — Telephone Encounter (Signed)
[facility] approved four visits exp 09/13/13, auth # [id number]

## 2013-07-13 ENCOUNTER — Telehealth: Payer: Self-pay | Admitting: Emergency Medicine

## 2013-07-13 NOTE — Telephone Encounter (Signed)
Referral underway for [facility]

## 2013-09-22 ENCOUNTER — Encounter: Payer: Self-pay | Admitting: Internal Medicine

## 2013-09-22 ENCOUNTER — Encounter: Payer: Self-pay | Admitting: *Deleted

## 2013-09-22 ENCOUNTER — Ambulatory Visit (INDEPENDENT_AMBULATORY_CARE_PROVIDER_SITE_OTHER): Payer: Commercial Managed Care - HMO | Admitting: Internal Medicine

## 2013-09-22 VITALS — BP 140/78 | HR 70 | Temp 98.2°F | Resp 16 | Wt 189.0 lb

## 2013-09-22 DIAGNOSIS — M25569 Pain in unspecified knee: Secondary | ICD-10-CM

## 2013-09-22 DIAGNOSIS — E669 Obesity, unspecified: Secondary | ICD-10-CM

## 2013-09-22 DIAGNOSIS — M25562 Pain in left knee: Principal | ICD-10-CM

## 2013-09-22 DIAGNOSIS — I10 Essential (primary) hypertension: Secondary | ICD-10-CM

## 2013-09-22 DIAGNOSIS — Z79899 Other long term (current) drug therapy: Secondary | ICD-10-CM

## 2013-09-22 DIAGNOSIS — N3941 Urge incontinence: Secondary | ICD-10-CM

## 2013-09-22 DIAGNOSIS — E785 Hyperlipidemia, unspecified: Secondary | ICD-10-CM

## 2013-09-22 DIAGNOSIS — E039 Hypothyroidism, unspecified: Secondary | ICD-10-CM

## 2013-09-22 DIAGNOSIS — M25561 Pain in right knee: Secondary | ICD-10-CM

## 2013-09-22 LAB — LIPID PANEL
CHOL/HDL RATIO: 3
Cholesterol: 152 mg/dL (ref 0–200)
HDL: 47.4 mg/dL (ref >39.00)
LDL CALC: 86 mg/dL (ref 0–99)
TRIGLYCERIDES: 94 mg/dL (ref 0.0–149.0)
VLDL: 18.8 mg/dL (ref 0.0–40.0)

## 2013-09-22 LAB — COMPREHENSIVE METABOLIC PANEL
ALBUMIN: 3.9 g/dL (ref 3.5–5.2)
ALK PHOS: 48 U/L (ref 39–117)
ALT: 15 U/L (ref 0–35)
AST: 22 U/L (ref 0–37)
BUN: 24 mg/dL — ABNORMAL HIGH (ref 6–23)
CALCIUM: 9.3 mg/dL (ref 8.4–10.5)
CO2: 27 mEq/L (ref 19–32)
CREATININE: 0.9 mg/dL (ref 0.4–1.2)
Chloride: 103 mEq/L (ref 96–112)
GFR: 65.65 mL/min (ref >60.00)
Glucose, Bld: 96 mg/dL (ref 70–99)
Potassium: 4.5 mEq/L (ref 3.5–5.1)
Sodium: 138 mEq/L (ref 135–145)
Total Bilirubin: 0.7 mg/dL (ref 0.3–1.2)
Total Protein: 6.7 g/dL (ref 6.0–8.3)

## 2013-09-22 LAB — TSH: TSH: 2.85 u[IU]/mL (ref 0.35–5.50)

## 2013-09-22 MED ORDER — OXYBUTYNIN CHLORIDE 5 MG PO TABS: 5.0000 mg | ORAL_TABLET | Freq: Three times a day (TID) | ORAL | Status: DC

## 2013-09-22 MED ORDER — TETANUS-DIPHTH-ACELL PERTUSSIS 5-2.5-18.5 LF-MCG/0.5 IM SUSP: 0.5000 mL | Freq: Once | INTRAMUSCULAR | Status: DC

## 2013-09-22 MED ORDER — CYANOCOBALAMIN 1000 MCG SL SUBL: 1.0000 | SUBLINGUAL_TABLET | Freq: Every day | SUBLINGUAL | Status: DC

## 2013-09-22 NOTE — Patient Instructions (Signed)
You are doing well!  I have sent a short acting bladder control medication to [facility] to save you money  I want you to lose 6 lbs over the next 6 months !

## 2013-09-22 NOTE — Assessment & Plan Note (Addendum)
S/p completion of Synvisc injections in both knees in March by Dr. Marry Guan. Has not had cortisone injections because her pain has improved  and she is able to work in the yard.  Encouraged her to increased walking.  She has  not fallen.  No additional compplaints.

## 2013-09-24 ENCOUNTER — Encounter: Payer: Self-pay | Admitting: *Deleted

## 2013-09-25 ENCOUNTER — Encounter: Payer: Self-pay | Admitting: Internal Medicine

## 2013-09-25 DIAGNOSIS — N3941 Urge incontinence: Secondary | ICD-10-CM | POA: Insufficient documentation

## 2013-09-25 NOTE — Progress Notes (Signed)
Patient ID: Jenna Marshall, female   DOB: 11-16-1925, 80 y.o.   MRN: [id number]  Patient Active Problem List   Diagnosis Date Noted  . Urinary incontinence, urge 09/25/2013  . Osteopenia of the elderly 05/13/2013  . Knee pain, bilateral 04/05/2013  . Encounter for long-term (current) use of other medications 03/12/2013  . History of shingles 09/02/2012  . History of DVT of lower extremity 09/02/2012  . Obesity (BMI 30-39.9) 09/01/2011  . Macular degeneration   . Hyperlipidemia   . Hypertension   . Hypothyroidism     Subjective:  CC:   Chief Complaint  Patient presents with  . Follow-up    6 month    HPI:   Jenna Marshall is a 80 y.o. female who presents for Follow up on chronic conditions including hypertension , hypothyroidism and obesity complicated by DJD knees with bilateral knee pain. She has no new complaints.  Pain is well controlled but she is not exercising other than occasional walks .     Past Medical History  Diagnosis Date  . Macular degeneration   . Hyperlipidemia   . Hypertension   . Thyroid disease   . Depression   . pernicious anemia   . Varicose veins     History reviewed. No pertinent past surgical history.     The following portions of the patient's history were reviewed and updated as appropriate: Allergies, current medications, and problem list.    Review of Systems:   Patient denies headache, fevers, malaise, unintentional weight loss, skin rash, eye pain, sinus congestion and sinus pain, sore throat, dysphagia,  hemoptysis , cough, dyspnea, wheezing, chest pain, palpitations, orthopnea, edema, abdominal pain, nausea, melena, diarrhea, constipation, flank pain, dysuria, hematuria, urinary  Frequency, nocturia, numbness, tingling, seizures,  Focal weakness, Loss of consciousness,  Tremor, insomnia, depression, anxiety, and suicidal ideation.     History   Social History  . Marital Status: Widowed    Spouse Name: N/A    Number of Children: N/A   . Years of Education: N/A   Occupational History  . Not on file.   Social History Main Topics  . Smoking status: Never Smoker   . Smokeless tobacco: Never Used  . Alcohol Use: No  . Drug Use: No  . Sexual Activity: No   Other Topics Concern  . Not on file   Social History Narrative  . No narrative on file    Objective:  Filed Vitals:   09/22/13 0926  BP: 140/78  Pulse: 70  Temp: 98.2 F (36.8 C)  Resp: 16     General appearance: alert, cooperative and appears stated age Ears: normal TM's and external ear canals both ears Throat: lips, mucosa, and tongue normal; teeth and gums normal Neck: no adenopathy, no carotid bruit, supple, symmetrical, trachea midline and thyroid not enlarged, symmetric, no tenderness/mass/nodules Back: symmetric, no curvature. ROM normal. No CVA tenderness. Lungs: clear to auscultation bilaterally Heart: regular rate and rhythm, S1, S2 normal, no murmur, click, rub or gallop Abdomen: soft, non-tender; bowel sounds normal; no masses,  no organomegaly Pulses: 2+ and symmetric Skin: Skin color, texture, turgor normal. No rashes or lesions Lymph nodes: Cervical, supraclavicular, and axillary nodes normal.  Assessment and Plan:  Knee pain, bilateral S/p completion of Synvisc injections in both knees in March by Dr. Marry Guan. Has not had cortisone injections because her pain has improved  and she is able to work in the yard.  Encouraged her to increased walking.  She has  not  fallen.  No additional compplaints.     Urinary incontinence, urge Controlled with Detrol LA but the cost is prohibitive.  Changing to oxybutynin tid.   Hypertension Well controlled on current regimen. Renal function stable, no changes today.  Lab Results  Component Value Date   CREATININE 0.9 09/22/2013    Lab Results  Component Value Date   NA 138 09/22/2013   K 4.5 09/22/2013   CL 103 09/22/2013   CO2 27 09/22/2013      Hypothyroidism Thyroid function is WNL  on current dose.  No current changes needed.   Lab Results  Component Value Date   TSH 2.85 09/22/2013     Hyperlipidemia LDL and triglycerides are at goal on current medications. He has no side effects and liver enzymes are normal. No changes today  Lab Results  Component Value Date   CHOL 152 09/22/2013   HDL 47.40 09/22/2013   LDLCALC 86 09/22/2013   LDLDIRECT 104.4 02/27/2011   TRIG 94.0 09/22/2013   CHOLHDL 3 09/22/2013   Lab Results  Component Value Date   ALT 15 09/22/2013   AST 22 09/22/2013   ALKPHOS 48 09/22/2013   BILITOT 0.7 09/22/2013     Obesity (BMI 30-39.9) She has lost 7 lbs in the last year . I have congratulated her in reduction of   BMI and encouraged  Continued weight loss with goal of 10% of body weigh over the next 6 months using a low glycemic index diet and regular exercise a minimum of 5 days per week.     Updated Medication List Outpatient Encounter Prescriptions as of 09/22/2013  Medication Sig  . aspirin 81 MG tablet Take 81 mg by mouth daily.    . Calcium Carbonate-Vitamin D (CALCIUM + D) 600-200 MG-UNIT TABS Take by mouth.    . levothyroxine (SYNTHROID, LEVOTHROID) 25 MCG tablet Take 1 tablet (25 mcg total) by mouth daily.  Marland Kitchen losartan (COZAAR) 100 MG tablet Take 1 tablet (100 mg total) by mouth daily.  . Multiple Vitamin (MULTIVITAMIN) tablet Take 1 tablet by mouth daily.    . Multiple Vitamins-Minerals (PRESERVISION AREDS PO) Take by mouth 2 (two) times daily.    . simvastatin (ZOCOR) 40 MG tablet Take 1 tablet (40 mg total) by mouth at bedtime.  . Tdap (BOOSTRIX) 5-2.5-18.5 LF-MCG/0.5 injection Inject 0.5 mLs into the muscle once.  . traMADol (ULTRAM) 50 MG tablet Take 1 tablet (50 mg total) by mouth every 6 (six) hours as needed for pain.  . [DISCONTINUED] celecoxib (CELEBREX) 200 MG capsule Take 1 capsule (200 mg total) by mouth daily.  . [DISCONTINUED] cyanocobalamin (,VITAMIN B-12,) 1000 MCG/ML injection Inject 1 mL (1,000 mcg total) into the  muscle every 30 (thirty) days.  . [DISCONTINUED] oxybutynin (DITROPAN-XL) 10 MG 24 hr tablet Take 1 tablet (10 mg total) by mouth daily.  . [DISCONTINUED] TDaP (BOOSTRIX) 5-2.5-18.5 LF-MCG/0.5 injection Inject 0.5 mLs into the muscle once.  . Cyanocobalamin 1000 MCG SUBL Place 1 tablet (1,000 mcg total) under the tongue daily.  Marland Kitchen oxybutynin (DITROPAN) 5 MG tablet Take 1 tablet (5 mg total) by mouth 3 (three) times daily.     Orders Placed This Encounter  Procedures  . Comprehensive metabolic panel  . TSH  . Lipid panel    Return in about 6 months (around 03/24/2014).

## 2013-09-25 NOTE — Assessment & Plan Note (Signed)
Thyroid function is WNL on current dose.  No current changes needed.   Lab Results  Component Value Date   TSH 2.85 09/22/2013

## 2013-09-25 NOTE — Assessment & Plan Note (Signed)
Well controlled on current regimen. Renal function stable, no changes today.  Lab Results  Component Value Date   CREATININE 0.9 09/22/2013    Lab Results  Component Value Date   NA 138 09/22/2013   K 4.5 09/22/2013   CL 103 09/22/2013   CO2 27 09/22/2013

## 2013-09-25 NOTE — Assessment & Plan Note (Signed)
LDL and triglycerides are at goal on current medications. He has no side effects and liver enzymes are normal. No changes today  Lab Results  Component Value Date   CHOL 152 09/22/2013   HDL 47.40 09/22/2013   LDLCALC 86 09/22/2013   LDLDIRECT 104.4 02/27/2011   TRIG 94.0 09/22/2013   CHOLHDL 3 09/22/2013   Lab Results  Component Value Date   ALT 15 09/22/2013   AST 22 09/22/2013   ALKPHOS 48 09/22/2013   BILITOT 0.7 09/22/2013

## 2013-09-25 NOTE — Assessment & Plan Note (Signed)
Controlled with Detrol LA but the cost is prohibitive.  Changing to oxybutynin tid.

## 2013-09-25 NOTE — Assessment & Plan Note (Signed)
She has lost 7 lbs in the last year . I have congratulated her in reduction of   BMI and encouraged  Continued weight loss with goal of 10% of body weigh over the next 6 months using a low glycemic index diet and regular exercise a minimum of 5 days per week.

## 2013-10-26 ENCOUNTER — Other Ambulatory Visit: Payer: Self-pay | Admitting: *Deleted

## 2013-10-26 DIAGNOSIS — I10 Essential (primary) hypertension: Secondary | ICD-10-CM

## 2013-10-26 MED ORDER — LOSARTAN POTASSIUM 100 MG PO TABS
100.0000 mg | ORAL_TABLET | Freq: Every day | ORAL | Status: DC
Start: 2013-10-26 — End: 2014-04-21

## 2013-10-26 NOTE — Telephone Encounter (Signed)
RF for Cozaar sent to [facility]

## 2013-10-27 ENCOUNTER — Other Ambulatory Visit: Payer: Self-pay | Admitting: *Deleted

## 2013-10-27 MED ORDER — SIMVASTATIN 40 MG PO TABS: 40.0000 mg | ORAL_TABLET | Freq: Every day | ORAL | Status: DC

## 2013-11-03 ENCOUNTER — Encounter: Payer: Self-pay | Admitting: Internal Medicine

## 2013-11-17 ENCOUNTER — Other Ambulatory Visit: Payer: Self-pay | Admitting: *Deleted

## 2013-11-17 MED ORDER — LEVOTHYROXINE SODIUM 25 MCG PO TABS: 25.0000 ug | ORAL_TABLET | Freq: Every day | ORAL | Status: DC

## 2014-03-16 ENCOUNTER — Other Ambulatory Visit: Payer: Self-pay | Admitting: Internal Medicine

## 2014-03-30 ENCOUNTER — Encounter: Payer: Self-pay | Admitting: Internal Medicine

## 2014-03-30 ENCOUNTER — Ambulatory Visit (INDEPENDENT_AMBULATORY_CARE_PROVIDER_SITE_OTHER): Payer: Commercial Managed Care - HMO | Admitting: Internal Medicine

## 2014-03-30 VITALS — BP 116/60 | HR 68 | Temp 98.3°F | Resp 16 | Ht 61.5 in | Wt 179.8 lb

## 2014-03-30 DIAGNOSIS — N3941 Urge incontinence: Secondary | ICD-10-CM

## 2014-03-30 DIAGNOSIS — Z79899 Other long term (current) drug therapy: Secondary | ICD-10-CM

## 2014-03-30 DIAGNOSIS — M25561 Pain in right knee: Secondary | ICD-10-CM

## 2014-03-30 DIAGNOSIS — E559 Vitamin D deficiency, unspecified: Secondary | ICD-10-CM

## 2014-03-30 DIAGNOSIS — E785 Hyperlipidemia, unspecified: Secondary | ICD-10-CM

## 2014-03-30 DIAGNOSIS — I10 Essential (primary) hypertension: Secondary | ICD-10-CM

## 2014-03-30 DIAGNOSIS — Z23 Encounter for immunization: Secondary | ICD-10-CM

## 2014-03-30 DIAGNOSIS — E038 Other specified hypothyroidism: Secondary | ICD-10-CM

## 2014-03-30 DIAGNOSIS — Z0000 Encounter for general adult medical examination without abnormal findings: Secondary | ICD-10-CM

## 2014-03-30 DIAGNOSIS — M25562 Pain in left knee: Secondary | ICD-10-CM

## 2014-03-30 DIAGNOSIS — E034 Atrophy of thyroid (acquired): Secondary | ICD-10-CM

## 2014-03-30 LAB — LIPID PANEL
Cholesterol: 144 mg/dL (ref 0–200)
HDL: 47.8 mg/dL (ref >39.00)
LDL CALC: 79 mg/dL (ref 0–99)
NonHDL: 96.2
TRIGLYCERIDES: 87 mg/dL (ref 0.0–149.0)
Total CHOL/HDL Ratio: 3
VLDL: 17.4 mg/dL (ref 0.0–40.0)

## 2014-03-30 LAB — CBC WITH DIFFERENTIAL/PLATELET
Basophils Absolute: 0 10*3/uL (ref 0.0–0.1)
Basophils Relative: 0.7 % (ref 0.0–3.0)
EOS PCT: 2.1 % (ref 0.0–5.0)
Eosinophils Absolute: 0.1 10*3/uL (ref 0.0–0.7)
HCT: 38.5 % (ref 36.0–46.0)
HEMOGLOBIN: 12.9 g/dL (ref 12.0–15.0)
Lymphocytes Relative: 24 % (ref 12.0–46.0)
Lymphs Abs: 1.6 10*3/uL (ref 0.7–4.0)
MCHC: 33.6 g/dL (ref 30.0–36.0)
MCV: 92.6 fl (ref 78.0–100.0)
Monocytes Absolute: 0.5 10*3/uL (ref 0.1–1.0)
Monocytes Relative: 7.1 % (ref 3.0–12.0)
NEUTROS ABS: 4.3 10*3/uL (ref 1.4–7.7)
NEUTROS PCT: 66.1 % (ref 43.0–77.0)
Platelets: 170 10*3/uL (ref 150.0–400.0)
RBC: 4.16 Mil/uL (ref 3.87–5.11)
RDW: 13.8 % (ref 11.5–15.5)
WBC: 6.5 10*3/uL (ref 4.0–10.5)

## 2014-03-30 LAB — COMPREHENSIVE METABOLIC PANEL
ALT: 16 U/L (ref 0–35)
AST: 24 U/L (ref 0–37)
Albumin: 3.6 g/dL (ref 3.5–5.2)
Alkaline Phosphatase: 48 U/L (ref 39–117)
BUN: 17 mg/dL (ref 6–23)
CALCIUM: 9.1 mg/dL (ref 8.4–10.5)
CO2: 25 meq/L (ref 19–32)
CREATININE: 1 mg/dL (ref 0.4–1.2)
Chloride: 104 mEq/L (ref 96–112)
GFR: 57.15 mL/min — AB (ref >60.00)
Glucose, Bld: 95 mg/dL (ref 70–99)
POTASSIUM: 4.1 meq/L (ref 3.5–5.1)
Sodium: 137 mEq/L (ref 135–145)
Total Bilirubin: 0.8 mg/dL (ref 0.2–1.2)
Total Protein: 7.2 g/dL (ref 6.0–8.3)

## 2014-03-30 LAB — VITAMIN D 25 HYDROXY (VIT D DEFICIENCY, FRACTURES): VITD: 42.34 ng/mL (ref 30.00–100.00)

## 2014-03-30 LAB — TSH: TSH: 1.98 u[IU]/mL (ref 0.35–4.50)

## 2014-03-30 MED ORDER — OXYBUTYNIN CHLORIDE 5 MG PO TABS: 5.0000 mg | ORAL_TABLET | Freq: Three times a day (TID) | ORAL | Status: DC

## 2014-03-30 NOTE — Progress Notes (Signed)
Patient ID: Jenna Marshall, female   DOB: 01/06/1926, 80 y.o.   MRN: [id number] The patient is here for annual Medicare wellness examination and management of other chronic and acute problems.  Bilateral knee pain s/p synvisc injections which provided about 3-4 months of excellent relief,  Now getting stiff again,  Ankles feeling weak,  Exercising on the bed instead of walking outside trying to work her ankels  Has arhtritis n the fingers of her right hands middle finger mostly. .  Considering steroid injections,  Wants my opinion.  Has lost 10 lb by cutting out breads and sweets.    Nocturia managed with meds that cause dry mouth and constipation     The risk factors are reflected in the social history.  The roster of all physicians providing medical care to patient - is listed in the Snapshot section of the chart.  Activities of daily living:  The patient is 100% independent in all ADLs: dressing, toileting, feeding as well as independent mobility  Home safety : The patient has smoke detectors in the home. They wear seatbelts.  There are no firearms at home. There is no violence in the home.   There is no risks for hepatitis, STDs or HIV. There is no   history of blood transfusion. They have no travel history to infectious disease endemic areas of the world.  The patient has seen their dentist in the last six month. They have seen their eye doctor in the last year. They admit to slight hearing difficulty with regard to whispered voices and some television programs.  They have deferred audiologic testing in the last year.  They do not  have excessive sun exposure. Discussed the need for sun protection: hats, long sleeves and use of sunscreen if there is significant sun exposure.   Diet: the importance of a healthy diet is discussed. They do have a healthy diet.  The benefits of regular aerobic exercise were discussed. She walks 4 times per week ,  20 minutes.   Depression screen: there are no  signs or vegative symptoms of depression- irritability, change in appetite, anhedonia, sadness/tearfullness.  Cognitive assessment: the patient manages all their financial and personal affairs and is actively engaged. They could relate day,date,year and events; recalled 2/3 objects at 3 minutes; performed clock-face test normally.  The following portions of the patient's history were reviewed and updated as appropriate: allergies, current medications, past family history, past medical history,  past surgical history, past social history  and problem list.  Visual acuity was not assessed per patient preference since she has regular follow up with her ophthalmologist. Hearing and body mass index were assessed and reviewed.   During the course of the visit the patient was educated and counseled about appropriate screening and preventive services including : fall prevention , diabetes screening, nutrition counseling, colorectal cancer screening, and recommended immunizations.    Objective:  BP 116/60  Pulse 68  Temp(Src) 98.3 F (36.8 C) (Oral)  Resp 16  Ht 5' 1.5" (1.562 m)  Wt 179 lb 12 oz (81.534 kg)  BMI 33.42 kg/m2  SpO2 94%   General appearance: alert, cooperative and appears stated age Head: Normocephalic, without obvious abnormality, atraumatic Eyes: conjunctivae/corneas clear. PERRL, EOM's intact. Fundi benign. Ears: normal TM's and external ear canals both ears Nose: Nares normal. Septum midline. Mucosa normal. No drainage or sinus tenderness. Throat: lips, mucosa, and tongue normal; teeth and gums normal Neck: no adenopathy, no carotid bruit, no JVD, supple, symmetrical,  trachea midline and thyroid not enlarged, symmetric, no tenderness/mass/nodules Lungs: clear to auscultation bilaterally Breasts: normal appearance, no masses or tenderness Heart: regular rate and rhythm, S1, S2 normal, no murmur, click, rub or gallop Abdomen: soft, non-tender; bowel sounds normal; no masses,   no organomegaly Extremities: extremities normal, atraumatic, no cyanosis or edema Pulses: 2+ and symmetric Skin: Skin color, texture, turgor normal. No rashes or lesions Neurologic: Alert and oriented X 3, normal strength and tone. Normal symmetric reflexes. Normal coordination and gait.   Assessment and Plan:  Hypertension Well controlled on current regimen. Renal function stable, no changes today.  Lab Results  Component Value Date   CREATININE 1.0 03/30/2014   Lab Results  Component Value Date   NA 137 03/30/2014   K 4.1 03/30/2014   CL 104 03/30/2014   CO2 25 03/30/2014     Hypothyroidism Thyroid function is WNL on current dose.  No current changes needed.   Lab Results  Component Value Date   TSH 1.98 03/30/2014     Urinary incontinence, urge Managed with oxyb utynin, which is causing dry mouth and constipation .  Recommended using only in the evening.   Knee pain, bilateral S/p completion of Synvisc injections in both knees in March by Dr. Marry Guan. Hashad cortisone injections with good results.  she is able to work in the yard.  Encouraged her to increased walking.  She has  not fallen.  No additional compplaints.       Long-term use of high-risk medication LDL and triglycerides are managed with simvastatin.  She hs no side effects and liver enzymes are normal. No changes today  Lab Results  Component Value Date   ALT 16 03/30/2014   AST 24 03/30/2014   ALKPHOS 48 03/30/2014   BILITOT 0.8 03/30/2014       Hyperlipidemia LDL and triglycerides are at goal on current medications. sHe has no side effects ,  No changes today  Lab Results  Component Value Date   CHOL 144 03/30/2014   HDL 47.80 03/30/2014   LDLCALC 79 03/30/2014   LDLDIRECT 104.4 02/27/2011   TRIG 87.0 03/30/2014   CHOLHDL 3 03/30/2014    Medicare annual wellness visit, subsequent Annual Medicare wellness  exam was done as well as a comprehensive physical exam and management of acute  and chronic conditions .  During the course of the visit the patient was educated and counseled about appropriate screening and preventive services including : fall prevention , diabetes screening, nutrition counseling, colorectal cancer screening, and recommended immunizations.  Printed recommendations for health maintenance screenings was given.    Updated Medication List Outpatient Encounter Prescriptions as of 03/30/2014  Medication Sig  . aspirin 81 MG tablet Take 81 mg by mouth daily.    . Calcium Carbonate-Vitamin D (CALCIUM + D) 600-200 MG-UNIT TABS Take by mouth.    . Cyanocobalamin 1000 MCG SUBL Place 1 tablet (1,000 mcg total) under the tongue daily.  Marland Kitchen levothyroxine (SYNTHROID, LEVOTHROID) 25 MCG tablet TAKE ONE (1) TABLET BY MOUTH EVERY DAY  . losartan (COZAAR) 100 MG tablet Take 1 tablet (100 mg total) by mouth daily.  . Multiple Vitamin (MULTIVITAMIN) tablet Take 1 tablet by mouth daily.    . Multiple Vitamins-Minerals (PRESERVISION AREDS PO) Take by mouth 2 (two) times daily.    Marland Kitchen oxybutynin (DITROPAN) 5 MG tablet Take 1 tablet (5 mg total) by mouth 3 (three) times daily.  . simvastatin (ZOCOR) 40 MG tablet Take 1 tablet (40 mg total)  by mouth at bedtime.  . Tdap (BOOSTRIX) 5-2.5-18.5 LF-MCG/0.5 injection Inject 0.5 mLs into the muscle once.  . traMADol (ULTRAM) 50 MG tablet Take 1 tablet (50 mg total) by mouth every 6 (six) hours as needed for pain.  . [DISCONTINUED] oxybutynin (DITROPAN) 5 MG tablet Take 1 tablet (5 mg total) by mouth 3 (three) times daily.

## 2014-03-30 NOTE — Progress Notes (Signed)
Pre-visit discussion using our clinic review tool. No additional management support is needed unless otherwise documented below in the visit note.  

## 2014-03-30 NOTE — Patient Instructions (Signed)
You had your annual  wellness exam today.   We will schedule your mammogram at [facility] in january You received the Prevnar pneumonia vaccine  And the flu vacc ine  today.   We will contact you with the bloodwork results  Try Salon Pas patches for your knee stiffness.  If the patches don't help,  Call us back and we'll make the referral to the Orthopedist.  Your right ear is full of ear wax.  Please use Debrox drops in it for a few days and then have your niece flush it out, or you can make an appt with my nurse to have it done    Health Maintenance Adopting a healthy lifestyle and getting preventive care can go a long way to promote health and wellness. Talk with your health care provider about what schedule of regular examinations is right for you. This is a good chance for you to check in with your provider about disease prevention and staying healthy. In between checkups, there are plenty of things you can do on your own. Experts have done a lot of research about which lifestyle changes and preventive measures are most likely to keep you healthy. Ask your health care provider for more information. WEIGHT AND DIET  Eat a healthy diet  Be sure to include plenty of vegetables, fruits, low-fat dairy products, and lean protein.  Do not eat a lot of foods high in solid fats, added sugars, or salt.  Get regular exercise. This is one of the most important things you can do for your health.  Most adults should exercise for at least 150 minutes each week. The exercise should increase your heart rate and make you sweat (moderate-intensity exercise).  Most adults should also do strengthening exercises at least twice a week. This is in addition to the moderate-intensity exercise.  Maintain a healthy weight  Body mass index (BMI) is a measurement that can be used to identify possible weight problems. It estimates body fat based on height and weight. Your health care provider can help determine  your BMI and help you achieve or maintain a healthy weight.  For females 34 years of age and older:   A BMI below 18.5 is considered underweight.  A BMI of 18.5 to 24.9 is normal.  A BMI of 25 to 29.9 is considered overweight.  A BMI of 30 and above is considered obese.  Watch levels of cholesterol and blood lipids  You should start having your blood tested for lipids and cholesterol at 79 years of age, then have this test every 5 years.  You may need to have your cholesterol levels checked more often if:  Your lipid or cholesterol levels are high.  You are older than 78 years of age.  You are at high risk for heart disease.  CANCER SCREENING   Lung Cancer  Lung cancer screening is recommended for adults 59-25 years old who are at high risk for lung cancer because of a history of smoking.  A yearly low-dose CT scan of the lungs is recommended for people who:  Currently smoke.  Have quit within the past 15 years.  Have at least a 30-pack-year history of smoking. A pack year is smoking an average of one pack of cigarettes a day for 1 year.  Yearly screening should continue until it has been 15 years since you quit.  Yearly screening should stop if you develop a health problem that would prevent you from having lung cancer treatment.  Breast Cancer  Practice breast self-awareness. This means understanding how your breasts normally appear and feel.  It also means doing regular breast self-exams. Let your health care provider know about any changes, no matter how small.  If you are in your 20s or 30s, you should have a clinical breast exam (CBE) by a health care provider every 1-3 years as part of a regular health exam.  If you are 53 or older, have a CBE every year. Also consider having a breast X-ray (mammogram) every year.  If you have a family history of breast cancer, talk to your health care provider about genetic screening.  If you are at high risk for breast  cancer, talk to your health care provider about having an MRI and a mammogram every year.  Breast cancer gene (BRCA) assessment is recommended for women who have family members with BRCA-related cancers. BRCA-related cancers include:  Breast.  Ovarian.  Tubal.  Peritoneal cancers.  Results of the assessment will determine the need for genetic counseling and BRCA1 and BRCA2 testing. Cervical Cancer Routine pelvic examinations to screen for cervical cancer are no longer recommended for nonpregnant women who are considered low risk for cancer of the pelvic organs (ovaries, uterus, and vagina) and who do not have symptoms. A pelvic examination may be necessary if you have symptoms including those associated with pelvic infections. Ask your health care provider if a screening pelvic exam is right for you.   The Pap test is the screening test for cervical cancer for women who are considered at risk.  If you had a hysterectomy for a problem that was not cancer or a condition that could lead to cancer, then you no longer need Pap tests.  If you are older than 65 years, and you have had normal Pap tests for the past 10 years, you no longer need to have Pap tests.  If you have had past treatment for cervical cancer or a condition that could lead to cancer, you need Pap tests and screening for cancer for at least 20 years after your treatment.  If you no longer get a Pap test, assess your risk factors if they change (such as having a new sexual partner). This can affect whether you should start being screened again.  Some women have medical problems that increase their chance of getting cervical cancer. If this is the case for you, your health care provider may recommend more frequent screening and Pap tests.  The human papillomavirus (HPV) test is another test that may be used for cervical cancer screening. The HPV test looks for the virus that can cause cell changes in the cervix. The cells  collected during the Pap test can be tested for HPV.  The HPV test can be used to screen women 23 years of age and older. Getting tested for HPV can extend the interval between normal Pap tests from three to five years.  An HPV test also should be used to screen women of any age who have unclear Pap test results.  After 78 years of age, women should have HPV testing as often as Pap tests.  Colorectal Cancer  This type of cancer can be detected and often prevented.  Routine colorectal cancer screening usually begins at 78 years of age and continues through 78 years of age.  Your health care provider may recommend screening at an earlier age if you have risk factors for colon cancer.  Your health care provider may also recommend using  home test kits to check for hidden blood in the stool.  A small camera at the end of a tube can be used to examine your colon directly (sigmoidoscopy or colonoscopy). This is done to check for the earliest forms of colorectal cancer.  Routine screening usually begins at age 30.  Direct examination of the colon should be repeated every 5-10 years through 78 years of age. However, you may need to be screened more often if early forms of precancerous polyps or small growths are found. Skin Cancer  Check your skin from head to toe regularly.  Tell your health care provider about any new moles or changes in moles, especially if there is a change in a mole's shape or color.  Also tell your health care provider if you have a mole that is larger than the size of a pencil eraser.  Always use sunscreen. Apply sunscreen liberally and repeatedly throughout the day.  Protect yourself by wearing long sleeves, pants, a wide-brimmed hat, and sunglasses whenever you are outside. HEART DISEASE, DIABETES, AND HIGH BLOOD PRESSURE   Have your blood pressure checked at least every 1-2 years. High blood pressure causes heart disease and increases the risk of stroke.  If  you are between 64 years and 34 years old, ask your health care provider if you should take aspirin to prevent strokes.  Have regular diabetes screenings. This involves taking a blood sample to check your fasting blood sugar level.  If you are at a normal weight and have a low risk for diabetes, have this test once every three years after 78 years of age.  If you are overweight and have a high risk for diabetes, consider being tested at a younger age or more often. PREVENTING INFECTION  Hepatitis B  If you have a higher risk for hepatitis B, you should be screened for this virus. You are considered at high risk for hepatitis B if:  You were born in a country where hepatitis B is common. Ask your health care provider which countries are considered high risk.  Your parents were born in a high-risk country, and you have not been immunized against hepatitis B (hepatitis B vaccine).  You have HIV or AIDS.  You use needles to inject street drugs.  You live with someone who has hepatitis B.  You have had sex with someone who has hepatitis B.  You get hemodialysis treatment.  You take certain medicines for conditions, including cancer, organ transplantation, and autoimmune conditions. Hepatitis C  Blood testing is recommended for:  Everyone born from [birth year] through 1963.  Anyone with known risk factors for hepatitis C. Sexually transmitted infections (STIs)  You should be screened for sexually transmitted infections (STIs) including gonorrhea and chlamydia if:  You are sexually active and are younger than 78 years of age.  You are older than 78 years of age and your health care provider tells you that you are at risk for this type of infection.  Your sexual activity has changed since you were last screened and you are at an increased risk for chlamydia or gonorrhea. Ask your health care provider if you are at risk.  If you do not have HIV, but are at risk, it may be recommended that  you take a prescription medicine daily to prevent HIV infection. This is called pre-exposure prophylaxis (PrEP). You are considered at risk if:  You are sexually active and do not regularly use condoms or know the HIV status of your  partner(s).  You take drugs by injection.  You are sexually active with a partner who has HIV. Talk with your health care provider about whether you are at high risk of being infected with HIV. If you choose to begin PrEP, you should first be tested for HIV. You should then be tested every 3 months for as long as you are taking PrEP.  PREGNANCY   If you are premenopausal and you may become pregnant, ask your health care provider about preconception counseling.  If you may become pregnant, take 400 to 800 micrograms (mcg) of folic acid every day.  If you want to prevent pregnancy, talk to your health care provider about birth control (contraception). OSTEOPOROSIS AND MENOPAUSE   Osteoporosis is a disease in which the bones lose minerals and strength with aging. This can result in serious bone fractures. Your risk for osteoporosis can be identified using a bone density scan.  If you are 29 years of age or older, or if you are at risk for osteoporosis and fractures, ask your health care provider if you should be screened.  Ask your health care provider whether you should take a calcium or vitamin D supplement to lower your risk for osteoporosis.  Menopause may have certain physical symptoms and risks.  Hormone replacement therapy may reduce some of these symptoms and risks. Talk to your health care provider about whether hormone replacement therapy is right for you.  HOME CARE INSTRUCTIONS   Schedule regular health, dental, and eye exams.  Stay current with your immunizations.   Do not use any tobacco products including cigarettes, chewing tobacco, or electronic cigarettes.  If you are pregnant, do not drink alcohol.  If you are breastfeeding, limit how  much and how often you drink alcohol.  Limit alcohol intake to no more than 1 drink per day for nonpregnant women. One drink equals 12 ounces of beer, 5 ounces of wine, or 1 ounces of hard liquor.  Do not use street drugs.  Do not share needles.  Ask your health care provider for help if you need support or information about quitting drugs.  Tell your health care provider if you often feel depressed.  Tell your health care provider if you have ever been abused or do not feel safe at home. Document Released: 12/03/2010 Document Revised: 10/04/2013 Document Reviewed: 04/21/2013 [facility] Patient Information 2015 [place], [place]. This information is not intended to replace advice given to you by your health care provider. Make sure you discuss any questions you have with your health care provider.

## 2014-03-31 ENCOUNTER — Telehealth: Payer: Self-pay | Admitting: Internal Medicine

## 2014-03-31 NOTE — Telephone Encounter (Signed)
emmi mailed  °

## 2014-04-01 DIAGNOSIS — Z0000 Encounter for general adult medical examination without abnormal findings: Secondary | ICD-10-CM | POA: Insufficient documentation

## 2014-04-01 NOTE — Assessment & Plan Note (Signed)
Managed with oxyb utynin, which is causing dry mouth and constipation .  Recommended using only in the evening.

## 2014-04-01 NOTE — Assessment & Plan Note (Signed)
S/p completion of Synvisc injections in both knees in March by Dr. Marry Guan. Hashad cortisone injections with good results.  she is able to work in the yard.  Encouraged her to increased walking.  She has  not fallen.  No additional compplaints.

## 2014-04-01 NOTE — Assessment & Plan Note (Signed)
LDL and triglycerides are at goal on current medications. sHe has no side effects ,  No changes today  Lab Results  Component Value Date   CHOL 144 03/30/2014   HDL 47.80 03/30/2014   LDLCALC 79 03/30/2014   LDLDIRECT 104.4 02/27/2011   TRIG 87.0 03/30/2014   CHOLHDL 3 03/30/2014

## 2014-04-01 NOTE — Assessment & Plan Note (Signed)

## 2014-04-01 NOTE — Assessment & Plan Note (Signed)
LDL and triglycerides are managed with simvastatin.  She hs no side effects and liver enzymes are normal. No changes today  Lab Results  Component Value Date   ALT 16 03/30/2014   AST 24 03/30/2014   ALKPHOS 48 03/30/2014   BILITOT 0.8 03/30/2014

## 2014-04-01 NOTE — Assessment & Plan Note (Signed)
Well controlled on current regimen. Renal function stable, no changes today.  Lab Results  Component Value Date   CREATININE 1.0 03/30/2014   Lab Results  Component Value Date   NA 137 03/30/2014   K 4.1 03/30/2014   CL 104 03/30/2014   CO2 25 03/30/2014

## 2014-04-01 NOTE — Assessment & Plan Note (Signed)
Thyroid function is WNL on current dose.  No current changes needed.   Lab Results  Component Value Date   TSH 1.98 03/30/2014

## 2014-04-04 ENCOUNTER — Encounter: Payer: Self-pay | Admitting: *Deleted

## 2014-04-13 ENCOUNTER — Other Ambulatory Visit: Payer: Self-pay | Admitting: Internal Medicine

## 2014-04-21 ENCOUNTER — Other Ambulatory Visit: Payer: Self-pay | Admitting: Internal Medicine

## 2014-06-07 ENCOUNTER — Ambulatory Visit: Payer: Self-pay

## 2014-06-07 DIAGNOSIS — R079 Chest pain, unspecified: Secondary | ICD-10-CM | POA: Diagnosis not present

## 2014-06-07 DIAGNOSIS — T783XXA Angioneurotic edema, initial encounter: Secondary | ICD-10-CM | POA: Diagnosis not present

## 2014-06-07 DIAGNOSIS — J329 Chronic sinusitis, unspecified: Secondary | ICD-10-CM | POA: Diagnosis not present

## 2014-06-08 ENCOUNTER — Telehealth: Payer: Self-pay | Admitting: Internal Medicine

## 2014-06-08 NOTE — Telephone Encounter (Signed)
Patient Name: GUYNELL KLEIBER  DOB: 1925/08/07    Nurse Assessment  Nurse: Ronnald Ramp, RN, Miranda Date/Time (Eastern Time): 06/08/2014 8:48:25 AM  Confirm and document reason for call. If symptomatic, describe symptoms. ---Caller states her mother's face was swollen and took her to UC last night. The doctor thought it might have been related to [place] and told her to stop taking it. Also, he prescribed her Augmentin just incase. Pt also told caller that she had chest pain yesterday but when she got to the UC, she was no longer having pain and pt told the provider she did not have any chest pain. Caller is not with the pt now. She is just calling for follow up call.  Has the patient traveled out of the country within the last 30 days? ---Not Applicable  Does the patient require triage? ---Yes  Related visit to physician within the last 2 weeks? ---Yes  Does the PT have any chronic conditions? (i.e. diabetes, asthma, etc.) ---Yes  List chronic conditions. ---HTN, Macular degeneration, Thyroid, Hyperlipidema.     Guidelines    Guideline Title Affirmed Question Affirmed Notes       Final Disposition User   Clinical Call Ronnald Ramp, RN, Miranda    Comments  Warm transferred to [facility] at the office for appt. Unable to get into epic.

## 2014-06-08 NOTE — Telephone Encounter (Signed)
Pt schedule at [facility], Dr Derrel Nip out of office remainder of this week. FYI

## 2014-06-09 ENCOUNTER — Encounter: Payer: Self-pay | Admitting: Internal Medicine

## 2014-06-09 ENCOUNTER — Ambulatory Visit (INDEPENDENT_AMBULATORY_CARE_PROVIDER_SITE_OTHER): Payer: Commercial Managed Care - HMO | Admitting: Internal Medicine

## 2014-06-09 ENCOUNTER — Ambulatory Visit: Payer: Self-pay | Admitting: Internal Medicine

## 2014-06-09 VITALS — BP 158/72 | HR 78 | Temp 98.1°F | Wt 178.0 lb

## 2014-06-09 DIAGNOSIS — R079 Chest pain, unspecified: Secondary | ICD-10-CM

## 2014-06-09 DIAGNOSIS — I10 Essential (primary) hypertension: Secondary | ICD-10-CM

## 2014-06-09 DIAGNOSIS — J340 Abscess, furuncle and carbuncle of nose: Secondary | ICD-10-CM

## 2014-06-09 DIAGNOSIS — R220 Localized swelling, mass and lump, head: Secondary | ICD-10-CM

## 2014-06-09 DIAGNOSIS — Z1231 Encounter for screening mammogram for malignant neoplasm of breast: Secondary | ICD-10-CM | POA: Diagnosis not present

## 2014-06-09 LAB — TROPONIN I: TNIDX: 0 ug/l (ref 0.00–0.06)

## 2014-06-09 LAB — CREATININE KINASE MB: CK-MB: 1.5 ng/mL (ref 0.3–4.0)

## 2014-06-09 LAB — HM MAMMOGRAPHY

## 2014-06-09 MED ORDER — MUPIROCIN 2 % EX OINT: 1.0000 "application " | TOPICAL_OINTMENT | Freq: Two times a day (BID) | CUTANEOUS | Status: DC

## 2014-06-09 NOTE — Progress Notes (Signed)
Subjective:    Patient ID: Jenna Marshall, female    DOB: 1926/04/25, 81 y.o.   MRN: [id number]  HPI  Pt presents to the clinic today with c/o swelling of her upper lip. She noticed this 4 days ago. She did go to UC 2 days ago for the same. She was advised to stop taking her BP medication at this could be the cause. They also thought that the swelling may be coming from a sinus infection. She was started on Augmentin and advised to take Claritin and Flonase OTC. She did have one episode of chest pain at the UC. ECG was normal. They also advised her to follow up with her PCP to have her cardiac enzymes checked.  She has been taking the antibiotic as prescribed but hascontinued to notice some swelling of the upper lip, although it does seem to be better. She stopped her Losartan as instructed but her BP today is 158/72. She denies chest pain or shortness of breath.  Review of Systems      Past Medical History  Diagnosis Date  . Macular degeneration   . Hyperlipidemia   . Hypertension   . Thyroid disease   . Depression   . pernicious anemia   . Varicose veins     Current Outpatient Prescriptions  Medication Sig Dispense Refill  . aspirin 81 MG tablet Take 81 mg by mouth daily.      . Calcium Carbonate-Vitamin D (CALCIUM + D) 600-200 MG-UNIT TABS Take by mouth.      . Cyanocobalamin 1000 MCG SUBL Place 1 tablet (1,000 mcg total) under the tongue daily. 90 tablet 3  . levothyroxine (SYNTHROID, LEVOTHROID) 25 MCG tablet TAKE ONE (1) TABLET BY MOUTH EVERY DAY 30 tablet 3  . Multiple Vitamin (MULTIVITAMIN) tablet Take 1 tablet by mouth daily.      . Multiple Vitamins-Minerals (PRESERVISION AREDS PO) Take by mouth 2 (two) times daily.      Marland Kitchen oxybutynin (DITROPAN) 5 MG tablet Take 1 tablet (5 mg total) by mouth 3 (three) times daily. 90 tablet 2  . simvastatin (ZOCOR) 40 MG tablet TAKE ONE TABLET BY MOUTH EVERY BEDTIME FOR CHOLESTEROL 30 tablet 3  . Tdap (BOOSTRIX) 5-2.5-18.5 LF-MCG/0.5  injection Inject 0.5 mLs into the muscle once. 0.5 mL 0  . traMADol (ULTRAM) 50 MG tablet Take 1 tablet (50 mg total) by mouth every 6 (six) hours as needed for pain. 120 tablet 3  . losartan (COZAAR) 100 MG tablet TAKE ONE (1) TABLET BY MOUTH EVERY DAY (Patient not taking: Reported on 06/09/2014) 30 tablet 5  . [DISCONTINUED] citalopram (CELEXA) 20 MG tablet Take 20 mg by mouth daily.       No current facility-administered medications for this visit.    Allergies  Allergen Reactions  . Codeine Nausea Only    Family History  Problem Relation Age of Onset  . Diabetes Mother   . Heart disease Father     History   Social History  . Marital Status: Widowed    Spouse Name: N/A    Number of Children: N/A  . Years of Education: N/A   Occupational History  . Not on file.   Social History Main Topics  . Smoking status: Never Smoker   . Smokeless tobacco: Never Used  . Alcohol Use: No  . Drug Use: No  . Sexual Activity: No   Other Topics Concern  . Not on file   Social History Narrative  Constitutional: Denies fever, malaise, fatigue, headache or abrupt weight changes.  HEENT: Pt reports swelling of upper lip. Denies eye pain, eye redness, ear pain, ringing in the ears, wax buildup, runny nose, nasal congestion, bloody nose, or sore throat. Respiratory: Denies difficulty breathing, shortness of breath, cough or sputum production.   Cardiovascular: Denies chest pain, chest tightness, palpitations or swelling in the hands or feet.  Skin: Denies redness, rashes, lesions or ulcercations.   No other specific complaints in a complete review of systems (except as listed in HPI above).   Objective:   Physical Exam   BP 158/72 mmHg  Pulse 78  Temp(Src) 98.1 F (36.7 C) (Oral)  Wt 178 lb (80.74 kg)  SpO2 98%  Wt Readings from Last 3 Encounters:  06/09/14 178 lb (80.74 kg)  03/30/14 179 lb 12 oz (81.534 kg)  09/22/13 189 lb (85.73 kg)    General: Appears her stated  age, chronically ill appearing in NAD. Skin: Warm, dry and intact. 1 cm round abscess noted inside left nare. HEENT: Head: normal shape and size; Nose: mucosa inflamed and moist, septum midline; Mild swelling under the opening of the nare, extending to the upper lip. Cardiovascular: Normal rate and rhythm. S1,S2 noted.  No murmur, rubs or gallops noted.  Pulmonary/Chest: Normal effort and positive vesicular breath sounds. No respiratory distress. No wheezes, rales or ronchi noted.     BMET    Component Value Date/Time   NA 137 03/30/2014 1004   K 4.1 03/30/2014 1004   CL 104 03/30/2014 1004   CO2 25 03/30/2014 1004   GLUCOSE 95 03/30/2014 1004   BUN 17 03/30/2014 1004   CREATININE 1.0 03/30/2014 1004   CREATININE 0.93 03/01/2011 1539   CALCIUM 9.1 03/30/2014 1004    Lipid Panel     Component Value Date/Time   CHOL 144 03/30/2014 1004   TRIG 87.0 03/30/2014 1004   HDL 47.80 03/30/2014 1004   CHOLHDL 3 03/30/2014 1004   VLDL 17.4 03/30/2014 1004   LDLCALC 79 03/30/2014 1004    CBC    Component Value Date/Time   WBC 6.5 03/30/2014 1004   RBC 4.16 03/30/2014 1004   HGB 12.9 03/30/2014 1004   HCT 38.5 03/30/2014 1004   PLT 170.0 03/30/2014 1004   MCV 92.6 03/30/2014 1004   MCHC 33.6 03/30/2014 1004   RDW 13.8 03/30/2014 1004   LYMPHSABS 1.6 03/30/2014 1004   MONOABS 0.5 03/30/2014 1004   EOSABS 0.1 03/30/2014 1004   BASOSABS 0.0 03/30/2014 1004    Hgb A1C No results found for: HGBA1C      Assessment & Plan:   Swelling of upper lip secondary to abscess of left nare:  Continue Augmentin BID eRx for Bactroban to left nare BID Avoid touching the area Watch for increased redness, pain, swelling or drainage- may need referral to ENT  Chest pain:  ECG normal per pt- no records for me to review Will check CKMB and Troponin I as requested Watch for recurrent symptoms  HTN:  Restart Losartan  RTC as needed or if symptoms persist or worsen

## 2014-06-09 NOTE — Patient Instructions (Signed)

## 2014-06-09 NOTE — Progress Notes (Signed)
Pre visit review using our clinic review tool, if applicable. No additional management support is needed unless otherwise documented below in the visit note. 

## 2014-06-20 DIAGNOSIS — H3532 Exudative age-related macular degeneration: Secondary | ICD-10-CM | POA: Diagnosis not present

## 2014-07-14 ENCOUNTER — Other Ambulatory Visit: Payer: Self-pay | Admitting: Internal Medicine

## 2014-08-31 ENCOUNTER — Other Ambulatory Visit: Payer: Self-pay | Admitting: Internal Medicine

## 2014-09-23 DIAGNOSIS — H3532 Exudative age-related macular degeneration: Secondary | ICD-10-CM | POA: Diagnosis not present

## 2014-09-29 ENCOUNTER — Ambulatory Visit (INDEPENDENT_AMBULATORY_CARE_PROVIDER_SITE_OTHER): Payer: Commercial Managed Care - HMO | Admitting: Internal Medicine

## 2014-09-29 ENCOUNTER — Encounter: Payer: Self-pay | Admitting: Internal Medicine

## 2014-09-29 VITALS — BP 138/68 | HR 69 | Temp 97.8°F | Resp 16 | Ht 62.0 in | Wt 193.0 lb

## 2014-09-29 DIAGNOSIS — R0789 Other chest pain: Secondary | ICD-10-CM

## 2014-09-29 DIAGNOSIS — E038 Other specified hypothyroidism: Secondary | ICD-10-CM

## 2014-09-29 DIAGNOSIS — E785 Hyperlipidemia, unspecified: Secondary | ICD-10-CM

## 2014-09-29 DIAGNOSIS — M25562 Pain in left knee: Secondary | ICD-10-CM

## 2014-09-29 DIAGNOSIS — I10 Essential (primary) hypertension: Secondary | ICD-10-CM | POA: Diagnosis not present

## 2014-09-29 DIAGNOSIS — R079 Chest pain, unspecified: Secondary | ICD-10-CM

## 2014-09-29 DIAGNOSIS — E034 Atrophy of thyroid (acquired): Secondary | ICD-10-CM | POA: Diagnosis not present

## 2014-09-29 DIAGNOSIS — B372 Candidiasis of skin and nail: Secondary | ICD-10-CM

## 2014-09-29 DIAGNOSIS — M25561 Pain in right knee: Secondary | ICD-10-CM

## 2014-09-29 DIAGNOSIS — E669 Obesity, unspecified: Secondary | ICD-10-CM | POA: Diagnosis not present

## 2014-09-29 LAB — COMPREHENSIVE METABOLIC PANEL
ALBUMIN: 4.2 g/dL (ref 3.5–5.2)
ALT: 13 U/L (ref 0–35)
AST: 18 U/L (ref 0–37)
Alkaline Phosphatase: 44 U/L (ref 39–117)
BUN: 24 mg/dL — ABNORMAL HIGH (ref 6–23)
CALCIUM: 9.5 mg/dL (ref 8.4–10.5)
CHLORIDE: 104 meq/L (ref 96–112)
CO2: 31 meq/L (ref 19–32)
CREATININE: 0.9 mg/dL (ref 0.40–1.20)
GFR: 62.98 mL/min (ref >60.00)
Glucose, Bld: 98 mg/dL (ref 70–99)
POTASSIUM: 4.7 meq/L (ref 3.5–5.1)
SODIUM: 138 meq/L (ref 135–145)
TOTAL PROTEIN: 6.4 g/dL (ref 6.0–8.3)
Total Bilirubin: 0.6 mg/dL (ref 0.2–1.2)

## 2014-09-29 LAB — LIPID PANEL
CHOLESTEROL: 153 mg/dL (ref 0–200)
HDL: 50.5 mg/dL (ref >39.00)
LDL CALC: 80 mg/dL (ref 0–99)
NonHDL: 102.5
Total CHOL/HDL Ratio: 3
Triglycerides: 113 mg/dL (ref 0.0–149.0)
VLDL: 22.6 mg/dL (ref 0.0–40.0)

## 2014-09-29 LAB — TSH: TSH: 1.92 u[IU]/mL (ref 0.35–4.50)

## 2014-09-29 MED ORDER — DICLOFENAC POTASSIUM 50 MG PO TABS: 50.0000 mg | ORAL_TABLET | Freq: Three times a day (TID) | ORAL | Status: DC

## 2014-09-29 MED ORDER — NYSTATIN 100000 UNIT/GM EX POWD: Freq: Two times a day (BID) | CUTANEOUS | Status: DC

## 2014-09-29 MED ORDER — PANTOPRAZOLE SODIUM 40 MG PO TBEC: 40.0000 mg | DELAYED_RELEASE_TABLET | Freq: Every day | ORAL | Status: DC

## 2014-09-29 NOTE — Progress Notes (Signed)
Patient ID: Jenna Marshall, female   DOB: 1925-08-03, 81 y.o.   MRN: [id number]   Patient Active Problem List   Diagnosis Date Noted  . Other chest pain 10/02/2014  . Intertriginous candidiasis 10/02/2014  . Medicare annual wellness visit, subsequent 04/01/2014  . Urinary incontinence, urge 09/25/2013  . Osteopenia of the elderly 05/13/2013  . Knee pain, bilateral 04/05/2013  . Long-term use of high-risk medication 03/12/2013  . History of shingles 09/02/2012  . History of DVT of lower extremity 09/02/2012  . Obesity (BMI 30-39.9) 09/01/2011  . Macular degeneration   . Hyperlipidemia   . Hypertension   . Hypothyroidism     Subjective:  CC:   Chief Complaint  Patient presents with  . Follow-up    pain in chest that radiates to both arms    HPI:   Jenna Marshall is a 81 y.o. female who presents for   Follow up on hypertension,  Hypothyroidism, and obesity, and new issues.   Cc:  Chest pain . Occurs intermittently,   Has had none in 2 weeks .  Aching quality and is associated with radiation to both upper arms.  Has occurred both with activity and has also woken her form sleep, lasts < 15 min,  Resolves spontaneously  No diaphoresis , no nausea  Or dyspnea with it .  Has resolved sometimes with burping  Has a history of hiatal hernia .  Takes OTC ranitidine prn pre meal but not bid.  cos  LEFT KNEE INJECTED WITH CORTISONE LAST MAY.  HELPED (BC THE VOLTAREN GEL was cost prohibitive) by. Wille Glaser WOLFE  WILL REFER   [facility] , SAW DR [place] .  REFERRAL PROBLEMS WITH [employer]   Red itchy skin rash under both breasts   Past Medical History  Diagnosis Date  . Macular degeneration   . Hyperlipidemia   . Hypertension   . Thyroid disease   . Depression   . pernicious anemia   . Varicose veins     History reviewed. No pertinent past surgical history.     The following portions of the patient's history were reviewed and updated as appropriate: Allergies, current  medications, and problem list.    Review of Systems:   Patient denies headache, fevers, malaise, unintentional weight loss, skin rash, eye pain, sinus congestion and sinus pain, sore throat, dysphagia,  hemoptysis , cough, dyspnea, wheezing, chest pain, palpitations, orthopnea, edema, abdominal pain, nausea, melena, diarrhea, constipation, flank pain, dysuria, hematuria, urinary  Frequency, nocturia, numbness, tingling, seizures,  Focal weakness, Loss of consciousness,  Tremor, insomnia, depression, anxiety, and suicidal ideation.     History   Social History  . Marital Status: Widowed    Spouse Name: N/A  . Number of Children: N/A  . Years of Education: N/A   Occupational History  . Not on file.   Social History Main Topics  . Smoking status: Never Smoker   . Smokeless tobacco: Never Used  . Alcohol Use: No  . Drug Use: No  . Sexual Activity: No   Other Topics Concern  . Not on file   Social History Narrative    Objective:  Filed Vitals:   09/29/14 0854  BP: 138/68  Pulse: 69  Temp: 97.8 F (36.6 C)  Resp: 16     General appearance: alert, cooperative and appears stated age Ears: normal TM's and external ear canals both ears Throat: lips, mucosa, and tongue normal; teeth and gums normal Neck: no adenopathy, no carotid bruit,  supple, symmetrical, trachea midline and thyroid not enlarged, symmetric, no tenderness/mass/nodules Back: symmetric, no curvature. ROM normal. No CVA tenderness. Lungs: clear to auscultation bilaterally Heart: regular rate and rhythm, S1, S2 normal, no murmur, click, rub or gallop Abdomen: soft, non-tender; bowel sounds normal; no masses,  no organomegaly Pulses: 2+ and symmetric Skin:  Erythematous rash under both breasts.  Lymph nodes: Cervical, supraclavicular, and axillary nodes normal.  Assessment and Plan:  Hypertension Well controlled on current regimen. Renal function stable, no changes today. BP 138/68 mmHg  Pulse 69   Temp(Src) 97.8 F (36.6 C) (Oral)  Resp 16  Ht 5\' 2"  (1.575 m)  Wt 193 lb (87.544 kg)  BMI 35.29 kg/m2  SpO2 96%   Lab Results  Component Value Date   CREATININE 0.90 09/29/2014   Lab Results  Component Value Date   NA 138 09/29/2014   K 4.7 09/29/2014   CL 104 09/29/2014   CO2 31 09/29/2014      Hypothyroidism Thyroid function is WNL on current dose.  No current changes needed.   Lab Results  Component Value Date   TSH 1.92 09/29/2014      Knee pain, bilateral Secondary to DJD,  Complicated by obesity. Trial of diclofenac  bid and Salon Pas patches until she can see PA wolfe   Obesity (BMI 30-39.9) I have addressed  BMI and recommended wt loss of 10% of body weight over the next 6 months using a low glycemic index diet and regular exercise a minimum of 5 days per week.     Other chest pain Atypical by history.  EKG normal.  Will treat for esophagitis with protonix, if symptoms recur,  Will  Have a  Cardiology evaluation .   Intertriginous candidiasis treatig affected areas under both breasts with Nystatin powder bid.      Updated Medication List Outpatient Encounter Prescriptions as of 09/29/2014  Medication Sig  . aspirin 81 MG tablet Take 81 mg by mouth daily.    . Calcium Carbonate-Vitamin D (CALCIUM + D) 600-200 MG-UNIT TABS Take by mouth.    . Cyanocobalamin 1000 MCG SUBL Place 1 tablet (1,000 mcg total) under the tongue daily.  Marland Kitchen levothyroxine (SYNTHROID, LEVOTHROID) 25 MCG tablet TAKE 1 TABLET DAILY FOR THYROID  . levothyroxine (SYNTHROID, LEVOTHROID) 25 MCG tablet TAKE 1 TABLET DAILY FOR THYROID  . losartan (COZAAR) 100 MG tablet TAKE ONE (1) TABLET BY MOUTH EVERY DAY  . Multiple Vitamin (MULTIVITAMIN) tablet Take 1 tablet by mouth daily.    . Multiple Vitamins-Minerals (PRESERVISION AREDS PO) Take by mouth 2 (two) times daily.    . mupirocin ointment (BACTROBAN) 2 % Place 1 application into the nose 2 (two) times daily.  Marland Kitchen oxybutynin (DITROPAN)  5 MG tablet Take 1 tablet (5 mg total) by mouth 3 (three) times daily.  . simvastatin (ZOCOR) 40 MG tablet TAKE ONE TABLET BY MOUTH EVERY BEDTIME FOR CHOLESTEROL  . traMADol (ULTRAM) 50 MG tablet Take 1 tablet (50 mg total) by mouth every 6 (six) hours as needed for pain.  . [DISCONTINUED] Tdap (BOOSTRIX) 5-2.5-18.5 LF-MCG/0.5 injection Inject 0.5 mLs into the muscle once.  . diclofenac (CATAFLAM) 50 MG tablet Take 1 tablet (50 mg total) by mouth 3 (three) times daily.  Marland Kitchen nystatin (MYCOSTATIN) powder Apply topically 2 (two) times daily.  . pantoprazole (PROTONIX) 40 MG tablet Take 1 tablet (40 mg total) by mouth daily.     Orders Placed This Encounter  Procedures  . TSH  . Lipid  panel  . Comprehensive metabolic panel  . Ambulatory referral to Orthopedic Surgery  . EKG 12-Lead    Return in about 4 weeks (around 10/27/2014).

## 2014-09-29 NOTE — Patient Instructions (Addendum)
YOUR CHEST PAIN MAY BE COMING FROM REFLUX ESOPHAGITIS  Take one protonix  Daily  2 hours after breakfast  If the chest pain recurs,  We will have you see cardiology  I am prescribing NYSTATIN POWDER TO USE UNDER YOUR BREAST TWICE DAILY UNTIL THE RASH RESOLVES   TRY THE SALONPAS PATCH FOR YOUR KNEE,  UNTIL YOU CAN SEE DR Eliberto Ivory  You can also TRY USING A COMBINATION OF DICLOFENAC EVERY 8 HOURS ALONG WITH 500 MG TYLENOL .   YOU HAVE GAINED TOO MUCH WEIGHT,  TELL YOUR FAMILY YOU MUST EAT SMALLER PORTIONS

## 2014-09-29 NOTE — Progress Notes (Signed)
Pre-visit discussion using our clinic review tool. No additional management support is needed unless otherwise documented below in the visit note.  

## 2014-10-02 ENCOUNTER — Encounter: Payer: Self-pay | Admitting: Internal Medicine

## 2014-10-02 ENCOUNTER — Telehealth: Payer: Self-pay | Admitting: Internal Medicine

## 2014-10-02 DIAGNOSIS — R0789 Other chest pain: Secondary | ICD-10-CM | POA: Insufficient documentation

## 2014-10-02 DIAGNOSIS — B372 Candidiasis of skin and nail: Secondary | ICD-10-CM | POA: Insufficient documentation

## 2014-10-02 HISTORY — PX: CORONARY ARTERY BYPASS GRAFT: SHX141

## 2014-10-02 NOTE — Assessment & Plan Note (Signed)
treatig affected areas under both breasts with Nystatin powder bid.

## 2014-10-02 NOTE — Assessment & Plan Note (Signed)
Well controlled on current regimen. Renal function stable, no changes today. BP 138/68 mmHg  Pulse 69  Temp(Src) 97.8 F (36.6 C) (Oral)  Resp 16  Ht 5\' 2"  (1.575 m)  Wt 193 lb (87.544 kg)  BMI 35.29 kg/m2  SpO2 96%   Lab Results  Component Value Date   CREATININE 0.90 09/29/2014   Lab Results  Component Value Date   NA 138 09/29/2014   K 4.7 09/29/2014   CL 104 09/29/2014   CO2 31 09/29/2014

## 2014-10-02 NOTE — Assessment & Plan Note (Signed)
I have addressed  BMI and recommended wt loss of 10% of body weight over the next 6 months using a low glycemic index diet and regular exercise a minimum of 5 days per week.   

## 2014-10-02 NOTE — Telephone Encounter (Signed)
Labs are normal,  Epic will not route results notes so all results notes have to be done this way

## 2014-10-02 NOTE — Assessment & Plan Note (Signed)
Atypical by history.  EKG normal.  Will treat for esophagitis with protonix, if symptoms recur,  Will  Have a  Cardiology evaluation .

## 2014-10-02 NOTE — Assessment & Plan Note (Signed)
Thyroid function is WNL on current dose.  No current changes needed.   Lab Results  Component Value Date   TSH 1.92 09/29/2014

## 2014-10-02 NOTE — Assessment & Plan Note (Signed)
Secondary to DJD,  Complicated by obesity. Trial of diclofenac  bid and Salon Pas patches until she can see PA wolfe

## 2014-10-03 NOTE — Telephone Encounter (Signed)
Patient notified and voiced understanding.

## 2014-10-03 NOTE — Telephone Encounter (Signed)
Left message for patient to return call to office. 

## 2014-10-03 NOTE — Telephone Encounter (Signed)
Tried to call patient line busy and no voicemail.

## 2014-10-11 ENCOUNTER — Observation Stay
Admission: EM | Admit: 2014-10-11 | Discharge: 2014-10-14 | Payer: Commercial Managed Care - HMO | Attending: Internal Medicine | Admitting: Internal Medicine

## 2014-10-11 ENCOUNTER — Encounter: Payer: Self-pay | Admitting: Medical Oncology

## 2014-10-11 ENCOUNTER — Emergency Department: Payer: Commercial Managed Care - HMO

## 2014-10-11 DIAGNOSIS — M40204 Unspecified kyphosis, thoracic region: Secondary | ICD-10-CM | POA: Insufficient documentation

## 2014-10-11 DIAGNOSIS — E039 Hypothyroidism, unspecified: Secondary | ICD-10-CM | POA: Diagnosis present

## 2014-10-11 DIAGNOSIS — I10 Essential (primary) hypertension: Secondary | ICD-10-CM | POA: Insufficient documentation

## 2014-10-11 DIAGNOSIS — R079 Chest pain, unspecified: Secondary | ICD-10-CM | POA: Diagnosis not present

## 2014-10-11 DIAGNOSIS — I5189 Other ill-defined heart diseases: Secondary | ICD-10-CM | POA: Diagnosis present

## 2014-10-11 DIAGNOSIS — K219 Gastro-esophageal reflux disease without esophagitis: Secondary | ICD-10-CM | POA: Insufficient documentation

## 2014-10-11 DIAGNOSIS — Z885 Allergy status to narcotic agent status: Secondary | ICD-10-CM | POA: Diagnosis not present

## 2014-10-11 DIAGNOSIS — I209 Angina pectoris, unspecified: Secondary | ICD-10-CM

## 2014-10-11 DIAGNOSIS — K449 Diaphragmatic hernia without obstruction or gangrene: Secondary | ICD-10-CM | POA: Diagnosis not present

## 2014-10-11 DIAGNOSIS — E785 Hyperlipidemia, unspecified: Secondary | ICD-10-CM | POA: Diagnosis not present

## 2014-10-11 DIAGNOSIS — I214 Non-ST elevation (NSTEMI) myocardial infarction: Secondary | ICD-10-CM

## 2014-10-11 DIAGNOSIS — I200 Unstable angina: Secondary | ICD-10-CM | POA: Diagnosis present

## 2014-10-11 DIAGNOSIS — I2510 Atherosclerotic heart disease of native coronary artery without angina pectoris: Secondary | ICD-10-CM | POA: Diagnosis not present

## 2014-10-11 HISTORY — DX: Atherosclerotic heart disease of native coronary artery without angina pectoris: I25.10

## 2014-10-11 HISTORY — DX: Other ill-defined heart diseases: I51.89

## 2014-10-11 HISTORY — DX: Hypothyroidism, unspecified: E03.9

## 2014-10-11 HISTORY — DX: Gastro-esophageal reflux disease without esophagitis: K21.9

## 2014-10-11 LAB — BASIC METABOLIC PANEL
ANION GAP: 7 (ref 5–15)
BUN: 33 mg/dL — AB (ref 6–20)
CO2: 27 mmol/L (ref 22–32)
Calcium: 9.1 mg/dL (ref 8.9–10.3)
Chloride: 104 mmol/L (ref 101–111)
Creatinine, Ser: 0.92 mg/dL (ref 0.44–1.00)
GFR calc Af Amer: >60 mL/min (ref >60)
GFR calc non Af Amer: 55 mL/min — ABNORMAL LOW (ref >60)
GLUCOSE: 91 mg/dL (ref 65–99)
POTASSIUM: 4.5 mmol/L (ref 3.5–5.1)
SODIUM: 138 mmol/L (ref 135–145)

## 2014-10-11 LAB — TROPONIN I
TROPONIN I: 0.04 ng/mL — AB (ref <0.031)
Troponin I: <0.03 ng/mL

## 2014-10-11 LAB — CBC
HCT: 37.7 % (ref 35.0–47.0)
HEMOGLOBIN: 12.5 g/dL (ref 12.0–16.0)
MCH: 32 pg (ref 26.0–34.0)
MCHC: 33.3 g/dL (ref 32.0–36.0)
MCV: 96.3 fL (ref 80.0–100.0)
PLATELETS: 145 10*3/uL — AB (ref 150–440)
RBC: 3.91 MIL/uL (ref 3.80–5.20)
RDW: 13.4 % (ref 11.5–14.5)
WBC: 7.8 10*3/uL (ref 3.6–11.0)

## 2014-10-11 MED ORDER — CALCIUM CARB-CHOLECALCIFEROL 600-200 MG-UNIT PO TABS
1.0000 | ORAL_TABLET | Freq: Every day | ORAL | Status: DC
  Filled 2014-10-11: qty 1

## 2014-10-11 MED ORDER — DIPHENHYDRAMINE HCL 25 MG PO CAPS
25.0000 mg | ORAL_CAPSULE | Freq: Every evening | ORAL | Status: DC | PRN
  Administered 2014-10-11 – 2014-10-12 (×2): 25 mg via ORAL
  Filled 2014-10-11 (×2): qty 1

## 2014-10-11 MED ORDER — NITROGLYCERIN 0.4 MG SL SUBL: 0.4000 mg | SUBLINGUAL_TABLET | SUBLINGUAL | Status: DC | PRN

## 2014-10-11 MED ORDER — ASPIRIN 325 MG PO TABS
325.0000 mg | ORAL_TABLET | Freq: Every day | ORAL | Status: DC
  Administered 2014-10-13: 325 mg via ORAL
  Filled 2014-10-11 (×4): qty 1

## 2014-10-11 MED ORDER — ONDANSETRON HCL 4 MG/2ML IJ SOLN: 4.0000 mg | Freq: Four times a day (QID) | INTRAMUSCULAR | Status: DC | PRN

## 2014-10-11 MED ORDER — ACETAMINOPHEN 325 MG PO TABS
650.0000 mg | ORAL_TABLET | Freq: Four times a day (QID) | ORAL | Status: DC | PRN
  Administered 2014-10-12: 650 mg via ORAL
  Filled 2014-10-11: qty 2

## 2014-10-11 MED ORDER — VITAMIN B-12 100 MCG PO TABS
100.0000 ug | ORAL_TABLET | Freq: Every day | ORAL | Status: DC
  Administered 2014-10-13: 100 ug via ORAL
  Filled 2014-10-11 (×2): qty 1

## 2014-10-11 MED ORDER — HEPARIN SODIUM (PORCINE) 5000 UNIT/ML IJ SOLN
5000.0000 [IU] | Freq: Three times a day (TID) | INTRAMUSCULAR | Status: DC
  Administered 2014-10-11 – 2014-10-13 (×7): 5000 [IU] via SUBCUTANEOUS
  Filled 2014-10-11 (×8): qty 1

## 2014-10-11 MED ORDER — OXYBUTYNIN CHLORIDE 5 MG PO TABS
5.0000 mg | ORAL_TABLET | Freq: Three times a day (TID) | ORAL | Status: DC
  Administered 2014-10-11 – 2014-10-13 (×6): 5 mg via ORAL
  Filled 2014-10-11 (×9): qty 1

## 2014-10-11 MED ORDER — ACETAMINOPHEN 650 MG RE SUPP: 650.0000 mg | Freq: Four times a day (QID) | RECTAL | Status: DC | PRN

## 2014-10-11 MED ORDER — ADULT MULTIVITAMIN W/MINERALS CH
1.0000 | ORAL_TABLET | Freq: Every day | ORAL | Status: DC
  Administered 2014-10-12 – 2014-10-13 (×2): 1 via ORAL
  Filled 2014-10-11 (×2): qty 1

## 2014-10-11 MED ORDER — SIMVASTATIN 40 MG PO TABS
40.0000 mg | ORAL_TABLET | Freq: Every day | ORAL | Status: DC
  Administered 2014-10-11 – 2014-10-13 (×3): 40 mg via ORAL
  Filled 2014-10-11 (×3): qty 1

## 2014-10-11 MED ORDER — CALCIUM CARBONATE-VITAMIN D 500-200 MG-UNIT PO TABS
1.0000 | ORAL_TABLET | Freq: Every day | ORAL | Status: DC
  Administered 2014-10-12 – 2014-10-13 (×2): 1 via ORAL
  Filled 2014-10-11 (×2): qty 1

## 2014-10-11 MED ORDER — LOSARTAN POTASSIUM 50 MG PO TABS
100.0000 mg | ORAL_TABLET | Freq: Every day | ORAL | Status: DC
  Administered 2014-10-12 – 2014-10-13 (×2): 100 mg via ORAL
  Filled 2014-10-11 (×2): qty 2

## 2014-10-11 MED ORDER — ONDANSETRON HCL 4 MG PO TABS: 4.0000 mg | ORAL_TABLET | Freq: Four times a day (QID) | ORAL | Status: DC | PRN

## 2014-10-11 MED ORDER — HYDRALAZINE HCL 20 MG/ML IJ SOLN
10.0000 mg | INTRAMUSCULAR | Status: DC | PRN
  Administered 2014-10-12: 10 mg via INTRAVENOUS
  Filled 2014-10-11: qty 1

## 2014-10-11 MED ORDER — PANTOPRAZOLE SODIUM 40 MG PO TBEC
40.0000 mg | DELAYED_RELEASE_TABLET | Freq: Every day | ORAL | Status: DC
  Administered 2014-10-12 – 2014-10-13 (×2): 40 mg via ORAL
  Filled 2014-10-11 (×2): qty 1

## 2014-10-11 MED ORDER — ASPIRIN 81 MG PO CHEW
CHEWABLE_TABLET | ORAL | Status: AC
  Administered 2014-10-11: 22:00:00
  Filled 2014-10-11: qty 4

## 2014-10-11 MED ORDER — MORPHINE SULFATE 2 MG/ML IJ SOLN: 2.0000 mg | INTRAMUSCULAR | Status: DC | PRN

## 2014-10-11 MED ORDER — LEVOTHYROXINE SODIUM 25 MCG PO TABS
25.0000 ug | ORAL_TABLET | Freq: Every day | ORAL | Status: DC
  Administered 2014-10-12 – 2014-10-13 (×2): 25 ug via ORAL
  Filled 2014-10-11 (×2): qty 1

## 2014-10-11 NOTE — ED Notes (Signed)
Charge nurse notified of pt's elevated troponin

## 2014-10-11 NOTE — ED Notes (Signed)
Pt states she has had intermittent chest pain x 7-10 days. States she was awakened by it last night but it got better and she went back to sleep. States she also had it when she brought in her trash can. States pain is achy and radiates to both upper arms. Pt alert & oriented with warm, dry skin.

## 2014-10-11 NOTE — ED Provider Notes (Signed)
[facility] Emergency Department Provider Note  ____________________________________________  Time seen: Approximately 8:03 PM  I have reviewed the triage vital signs and the nursing notes.   HISTORY  Chief Complaint Chest Pain    HPI Jenna Marshall is a 81 y.o. female with history of hypertension, hyperlipidemia, hypothyroidism, GERD presents for evaluation of nearly one week of intermittent chest pain, worse last night. Patient reports she awoke from sleep last night with chest throbbing and pressure which radiated into bilateral arms. She reports she currently chest pain-free but this has been occurring intermittently for nearly a week and is worsened with exertion. no recent medication changes. Rest tends to make it better. No recent illness including no cough, sneezing, runny nose, congestion, fevers, vomiting. Current severity 0 out of 10.    Past Medical History  Diagnosis Date  . Hypertension     There are no active problems to display for this patient.   History reviewed. No pertinent past surgical history.  Current Outpatient Rx  Name  Route  Sig  Dispense  Refill  . Calcium Carb-Cholecalciferol 600-200 MG-UNIT TABS   Oral   Take 1 tablet by mouth daily.         . cyanocobalamin 1000 MCG tablet   Oral   Take 100 mcg by mouth daily.         . diclofenac (VOLTAREN) 50 MG EC tablet   Oral   Take 50 mg by mouth 2 (two) times daily.         Marland Kitchen levothyroxine (SYNTHROID, LEVOTHROID) 25 MCG tablet   Oral   Take 25 mcg by mouth daily before breakfast.         . losartan (COZAAR) 100 MG tablet   Oral   Take 100 mg by mouth daily.         . Multiple Vitamin (MULTIVITAMIN WITH MINERALS) TABS tablet   Oral   Take 1 tablet by mouth daily.         Marland Kitchen oxybutynin (DITROPAN) 5 MG tablet   Oral   Take 5 mg by mouth 3 (three) times daily.         . pantoprazole (PROTONIX) 40 MG tablet   Oral   Take 40 mg by mouth daily.         .  simvastatin (ZOCOR) 40 MG tablet   Oral   Take 40 mg by mouth at bedtime.           Allergies Codeine  History reviewed. No pertinent family history.  Social History History  Substance Use Topics  . Smoking status: Never Smoker   . Smokeless tobacco: Not on file  . Alcohol Use: No    Review of Systems Constitutional: No fever/chills Eyes: No visual changes. ENT: No sore throat. Cardiovascular: + chest pain. Respiratory: Denies shortness of breath. Gastrointestinal: No abdominal pain.  No nausea, no vomiting.  No diarrhea.  No constipation. Genitourinary: Negative for dysuria. Musculoskeletal: Negative for back pain. Skin: Negative for rash. Neurological: Negative for headaches, focal weakness or numbness.  10-point ROS otherwise negative.  ____________________________________________   PHYSICAL EXAM:  VITAL SIGNS: ED Triage Vitals  Enc Vitals Group     BP 10/11/14 1723 134/66 mmHg     Pulse Rate 10/11/14 1723 78     Resp 10/11/14 1723 18     Temp 10/11/14 1723 97.4 F (36.3 C)     Temp Source 10/11/14 1723 Oral     SpO2 10/11/14 1723 96 %  Weight 10/11/14 1723 180 lb (81.647 kg)     Height 10/11/14 1723 5\' 4"  (1.626 m)     Head Cir --      Peak Flow --      Pain Score 10/11/14 1724 8     Pain Loc --      Pain Edu? --      Excl. in [place]? --     Constitutional: Alert and oriented. Well appearing and in no acute distress. Eyes: Conjunctivae are normal. PERRL. EOMI. Head: Atraumatic. Nose: No congestion/rhinnorhea. Mouth/Throat: Mucous membranes are moist.  Oropharynx non-erythematous. Neck: No stridor.  Hematological/Lymphatic/Immunilogical: No cervical lymphadenopathy. Cardiovascular: Normal rate, regular rhythm. Grossly normal heart sounds.  Good peripheral circulation. Respiratory: Normal respiratory effort.  No retractions. Lungs CTAB. Gastrointestinal: Soft and nontender. No distention. No abdominal bruits. No CVA tenderness. Genitourinary:  deferred Musculoskeletal: No lower extremity tenderness nor edema.  No joint effusions. Neurologic:  Normal speech and language. No gross focal neurologic deficits are appreciated. Speech is normal. No gait instability. Skin:  Skin is warm, dry and intact. No rash noted. Psychiatric: Mood and affect are normal. Speech and behavior are normal.  ____________________________________________   LABS (all labs ordered are listed, but only abnormal results are displayed)  Labs Reviewed  CBC - Abnormal; Notable for the following:    Platelets 145 (*)    All other components within normal limits  BASIC METABOLIC PANEL - Abnormal; Notable for the following:    BUN 33 (*)    GFR calc non Af Amer 55 (*)    All other components within normal limits  TROPONIN I - Abnormal; Notable for the following:    Troponin I 0.04 (*)    All other components within normal limits   ____________________________________________  EKG  ED ECG REPORT   Date: 10/11/2014  EKG Time: 17:31  Rate: 76  Rhythm: normal sinus rhythm  Axis: Normal  Intervals:none  ST&T Change: No acute ST segment change, Q waves in lead 3, lead aVF, V1, V2, V3  ____________________________________________  RADIOLOGY  CXR: IMPRESSION: 1. No acute findings. 2. Mild cardiomegaly. 3. Moderate hiatal hernia.  ____________________________________________   PROCEDURES  Procedure(s) performed: None  Critical Care performed: No  ____________________________________________   INITIAL IMPRESSION / ASSESSMENT AND PLAN / ED COURSE  Pertinent labs & imaging results that were available during my care of the patient were reviewed by me and considered in my medical decision making (see chart for details).  Jenna Marshall is a 81 y.o. female with history of hypertension, hyperlipidemia, hypothyroidism, GERD presents for evaluation of nearly one week of intermittent chest pain, currently chest pain-free. Troponin elevated 0.04 and  EKG shows Q waves in anterior and inferior leads concerning for ACS. Aspirin ordered. Discussed with hospitalist for admission. Doubt PE or acute aortic dissection. ____________________________________________   FINAL CLINICAL IMPRESSION(S) / ED DIAGNOSES  Final diagnoses:  NSTEMI (non-ST elevated myocardial infarction)  Ischemic chest pain      Joanne Gavel, MD 10/11/14 2035

## 2014-10-11 NOTE — ED Notes (Signed)
Pt reports she began having chest pain last night, when the pain worsens it radiates into BUE.

## 2014-10-11 NOTE — H&P (Signed)
[facility] [place] NAME: Jenna Marshall    MR#:  [id number]  DATE OF BIRTH:  [birth year]   DATE OF ADMISSION:  10/11/2014  PRIMARY CARE PHYSICIAN: [name], MD   REQUESTING/REFERRING PHYSICIAN: Edd Fabian  CHIEF COMPLAINT:   Chief Complaint  Patient presents with  . Chest Pain    HISTORY OF PRESENT ILLNESS:  Jenna Marshall  is a 81 y.o. female with a known history of essential hypertension, hyperlipidemia unspecified who is presenting with chest pain. She describes having intermittent chest pain per se one week or more induration he was previously with activity however she is somewhat uncertain of the details regardless she is describing episodes of chest pain which occurred last night at rest. Chest pain retrosternal in location, radiation to left shoulder, "pain" in quality, 7-8 in intensity no further worsening or relieving factors. She denies any associated symptoms at that time including shortness of breath, diaphoresis, nausea. She is symptom-free at this time  PAST MEDICAL HISTORY:   Past Medical History  Diagnosis Date  . Hypertension     PAST SURGICAL HISTORY:  History reviewed. No pertinent past surgical history.  SOCIAL HISTORY:   History  Substance Use Topics  . Smoking status: Never Smoker   . Smokeless tobacco: Not on file  . Alcohol Use: No    FAMILY HISTORY:   Family History  Problem Relation Age of Onset  . Coronary artery disease Mother   . Coronary artery disease Brother     DRUG ALLERGIES:   Allergies  Allergen Reactions  . Codeine Nausea And Vomiting    REVIEW OF SYSTEMS:  REVIEW OF SYSTEMS:  CONSTITUTIONAL: Denies fevers, chills, fatigue, weakness.  EYES: Denies blurred vision, double vision, or eye pain.  EARS, NOSE, THROAT: Denies tinnitus, ear pain, hearing loss.  RESPIRATORY: denies cough, shortness of breath, wheezing  CARDIOVASCULAR: Positive for chest pain, denies palpitations,  edema.  GASTROINTESTINAL: Denies nausea, vomiting, diarrhea, abdominal pain.  GENITOURINARY: Denies dysuria, hematuria.  ENDOCRINE: Denies nocturia or thyroid problems. HEMATOLOGIC AND LYMPHATIC: Denies easy bruising or bleeding.  SKIN: Denies rash or lesions.  MUSCULOSKELETAL: Denies pain in neck, back, shoulder, knees, hips, or further arthritic symptoms.  NEUROLOGIC: Denies paralysis, paresthesias.  PSYCHIATRIC: Denies anxiety or depressive symptoms. Otherwise full review of systems performed by me is negative.   MEDICATIONS AT HOME:   Prior to Admission medications   Medication Sig Start Date End Date Taking? Authorizing Provider  Calcium Carb-Cholecalciferol 600-200 MG-UNIT TABS Take 1 tablet by mouth daily.   Yes Historical Provider, MD  cyanocobalamin 1000 MCG tablet Take 100 mcg by mouth daily.   Yes Historical Provider, MD  diclofenac (VOLTAREN) 50 MG EC tablet Take 50 mg by mouth 2 (two) times daily.   Yes Historical Provider, MD  levothyroxine (SYNTHROID, LEVOTHROID) 25 MCG tablet Take 25 mcg by mouth daily before breakfast.   Yes Historical Provider, MD  losartan (COZAAR) 100 MG tablet Take 100 mg by mouth daily.   Yes Historical Provider, MD  Multiple Vitamin (MULTIVITAMIN WITH MINERALS) TABS tablet Take 1 tablet by mouth daily.   Yes Historical Provider, MD  oxybutynin (DITROPAN) 5 MG tablet Take 5 mg by mouth 3 (three) times daily.   Yes Historical Provider, MD  pantoprazole (PROTONIX) 40 MG tablet Take 40 mg by mouth daily.   Yes Historical Provider, MD  simvastatin (ZOCOR) 40 MG tablet Take 40 mg by mouth at bedtime.   Yes Historical Provider,  MD      VITAL SIGNS:  Blood pressure 134/66, pulse 78, temperature 97.4 F (36.3 C), temperature source Oral, resp. rate 18, height 5\' 4"  (1.626 m), weight 180 lb (81.647 kg), SpO2 96 %.  PHYSICAL EXAMINATION:  VITAL SIGNS: Filed Vitals:   10/11/14 1723  BP: 134/66  Pulse: 78  Temp: 97.4 F (36.3 C)  Resp: 71    GENERAL:81 y.o.female currently in no acute distress.  HEAD: Normocephalic, atraumatic.  EYES: Pupils equal, round, reactive to light. Extraocular muscles intact. No scleral icterus.  MOUTH: Moist mucosal membrane. Dentition intact. No abscess noted.  EAR, NOSE, THROAT: Clear without exudates. No external lesions.  NECK: Supple. No thyromegaly. No nodules. No JVD.  PULMONARY: Clear to ascultation, without wheeze rails or rhonci. No use of accessory muscles, Good respiratory effort. good air entry bilaterally CHEST: Nontender to palpation.  CARDIOVASCULAR: S1 and S2. Regular rate and rhythm. No murmurs, rubs, or gallops. Trace pedal edema. Pedal pulses 2+ bilaterally.  GASTROINTESTINAL: Soft, nontender, nondistended. No masses. Positive bowel sounds. No hepatosplenomegaly.  MUSCULOSKELETAL: No swelling, clubbing, or edema. Range of motion full in all extremities.  NEUROLOGIC: Cranial nerves II through XII are intact. No gross focal neurological deficits. Sensation intact. Reflexes intact.  SKIN: No ulceration, lesions, rashes, or cyanosis. Skin warm and dry. Turgor intact.  PSYCHIATRIC: Mood, affect within normal limits. The patient is awake, alert and oriented x 3. Insight, judgment intact.    LABORATORY PANEL:   CBC  Recent Labs Lab 10/11/14 1729  WBC 7.8  HGB 12.5  HCT 37.7  PLT 145*   ------------------------------------------------------------------------------------------------------------------  Chemistries   Recent Labs Lab 10/11/14 1729  NA 138  K 4.5  CL 104  CO2 27  GLUCOSE 91  BUN 33*  CREATININE 0.92  CALCIUM 9.1   ------------------------------------------------------------------------------------------------------------------  Cardiac Enzymes  Recent Labs Lab 10/11/14 1729  TROPONINI 0.04*   ------------------------------------------------------------------------------------------------------------------  RADIOLOGY:  Dg Chest 2  View  10/11/2014   CLINICAL DATA:  Chest pain  EXAM: CHEST  2 VIEW  COMPARISON:  None.  FINDINGS: Mild cardiomegaly. There is a gas-filled lower mediastinal mass consistent with moderate hiatal hernia. Normal aortic and hilar contours. There is no edema, consolidation, effusion, or pneumothorax. Exaggerated thoracic kyphosis without compression fracture.  IMPRESSION: 1. No acute findings. 2. Mild cardiomegaly. 3. Moderate hiatal hernia.   Electronically Signed   By: Monte Fantasia M.D.   On: 10/11/2014 18:06    EKG:   Orders placed or performed during the hospital encounter of 10/11/14  . ED EKG (<70mins upon arrival to the ED)  . ED EKG (<62mins upon arrival to the ED)    IMPRESSION AND PLAN:   87 her Caucasian female with history of essential hypertension, hyperlipidemia and late family onset coronary artery disease presenting with chest pain which progressed.  1. Chest pain, central: Admit to telemetry under observational status, initiate aspirin statin therapy, trend cardiac enzymes 3, nitroglycerin/ morphine for pain control. Given symptoms suggestive of angina will order stress test performed if cardiac enzymes rule out acute injury.  2. Hypertension essential: Continue Cozaar 3. Hyperlipidemia unspecified: Continue Zocor 4. Gastroesophageal reflux disease without esophagitis: PPI therapy 5. Hypothyroidism unspecified: Synthroid 6.Venous thromboembolism prophylactic: Heparin subcutaneous      All the records are reviewed and case discussed with ED provider. Management plans discussed with the patient, family and they are in agreement.  CODE STATUS: Full code  TOTAL TIME TAKING CARE OF THIS PATIENT: 35 minutes.    Toleen Lachapelle,  Karenann Cai.D on 10/11/2014 at 9:02 PM  Between 7am to 6pm - Pager - [phone]  After 6pm: House Pager: - [facility]  Office  [phone]  CC: Primary care physician; [name], MD

## 2014-10-12 DIAGNOSIS — I2510 Atherosclerotic heart disease of native coronary artery without angina pectoris: Secondary | ICD-10-CM | POA: Diagnosis not present

## 2014-10-12 DIAGNOSIS — Z885 Allergy status to narcotic agent status: Secondary | ICD-10-CM | POA: Diagnosis not present

## 2014-10-12 DIAGNOSIS — E785 Hyperlipidemia, unspecified: Secondary | ICD-10-CM | POA: Diagnosis not present

## 2014-10-12 DIAGNOSIS — E039 Hypothyroidism, unspecified: Secondary | ICD-10-CM | POA: Diagnosis not present

## 2014-10-12 DIAGNOSIS — M40204 Unspecified kyphosis, thoracic region: Secondary | ICD-10-CM | POA: Diagnosis not present

## 2014-10-12 DIAGNOSIS — K449 Diaphragmatic hernia without obstruction or gangrene: Secondary | ICD-10-CM | POA: Diagnosis not present

## 2014-10-12 DIAGNOSIS — K219 Gastro-esophageal reflux disease without esophagitis: Secondary | ICD-10-CM | POA: Diagnosis not present

## 2014-10-12 DIAGNOSIS — R079 Chest pain, unspecified: Secondary | ICD-10-CM | POA: Diagnosis not present

## 2014-10-12 DIAGNOSIS — I10 Essential (primary) hypertension: Secondary | ICD-10-CM | POA: Diagnosis not present

## 2014-10-12 LAB — TROPONIN I: TROPONIN I: 0.03 ng/mL (ref <0.031)

## 2014-10-12 MED ORDER — SODIUM CHLORIDE 0.9 % IV BOLUS (SEPSIS)
250.0000 mL | Freq: Once | INTRAVENOUS | Status: AC
  Administered 2014-10-12: 250 mL via INTRAVENOUS

## 2014-10-12 NOTE — Progress Notes (Signed)
Patient was admitted from the ER via a stretcher d/t on going chest pain for 1 week. On admission patient is A&O X4, denied pain or any discomfort on her chest. Patient admission documentation was completed and she was oreited to her room. Patient was informed about a scheduled stress test in the AM ans was kept NPO. Patient did not sign the consent form because she stated that the physician has not talked to her about. It. Patient was stable overnight w/o any distress. Patient needed items were kept within patient's reach and was provided bathroom

## 2014-10-12 NOTE — Discharge Instructions (Signed)

## 2014-10-12 NOTE — Plan of Care (Addendum)
Problem: Consults Goal: Diabetes Guidelines if Diabetic/Glucose > 140 If diabetic or lab glucose is > 140 mg/dl - Initiate Diabetes/Hyperglycemia Guidelines & Document Interventions  Outcome: Progressing Pt is alert and oriented x 4, pt c/o mild headache improved with tylenol, blood pressure elevated improved with hydralazine, tolerating diet, Stress test not performed in am because dopamine did not arrive to facility, pt will have stress test performed on 5/12, consent signed and on chart. Pt continues on room air, up to bathroom with 1 assist, new IV started in Left arm. Denies chest pain. Tolerating activity. Uneventful shift.

## 2014-10-12 NOTE — Progress Notes (Addendum)
[facility] [place] NAME: Jenna Marshall    MR#:  [id number]  DATE OF BIRTH:  [birth year]  SUBJECTIVE:  CHIEF COMPLAINT:   Chief Complaint  Patient presents with  . Chest Pain   admitted for chest pain and right arm ache. No pain since admission. Scheduled for Myoview today. Known history of hiatal hernia but denies any reflux symptoms at this time.  REVIEW OF SYSTEMS:  Review of Systems  Constitutional: Negative for fever and chills.  Respiratory: Negative for cough, shortness of breath and wheezing.   Cardiovascular: Negative for chest pain and palpitations.  Gastrointestinal: Negative for nausea, vomiting, abdominal pain, diarrhea and constipation.  Genitourinary: Negative for dysuria.  Neurological: Negative for dizziness, seizures and headaches.    DRUG ALLERGIES:   Allergies  Allergen Reactions  . Codeine Nausea And Vomiting    VITALS:  Blood pressure 182/57, pulse 72, temperature 97.9 F (36.6 C), temperature source Oral, resp. rate 18, height 5\' 4"  (1.626 m), Marshall 85.594 kg (188 lb 11.2 oz), SpO2 95 %.  PHYSICAL EXAMINATION:  Physical Exam  GENERAL:  81 y.o.-year-old patient lying in the bed with no acute distress.  EYES: Pupils equal, round, reactive to light and accommodation. No scleral icterus. Extraocular muscles intact.  HEENT: Head atraumatic, normocephalic. Oropharynx and nasopharynx clear.  NECK:  Supple, no jugular venous distention. No thyroid enlargement, no tenderness.  LUNGS: Normal breath sounds bilaterally, no wheezing, rales,rhonchi or crepitation. No use of accessory muscles of respiration.  CARDIOVASCULAR: S1, S2 normal. 3/6 systolic murmur present, no rubs, or gallops.  ABDOMEN: Soft, nontender, nondistended. Bowel sounds present. No organomegaly or mass.  EXTREMITIES: No pedal edema, cyanosis, or clubbing.  NEUROLOGIC: Cranial nerves II through XII are intact. Muscle strength 5/5 in all  extremities. Sensation intact. Gait not checked.  PSYCHIATRIC: The patient is alert and oriented x 3.  SKIN: No obvious rash, lesion, or ulcer.    LABORATORY PANEL:   CBC  Recent Labs Lab 10/11/14 1729  WBC 7.8  HGB 12.5  HCT 37.7  PLT 145*   ------------------------------------------------------------------------------------------------------------------  Chemistries   Recent Labs Lab 10/11/14 1729  NA 138  K 4.5  CL 104  CO2 27  GLUCOSE 91  BUN 33*  CREATININE 0.92  CALCIUM 9.1   ------------------------------------------------------------------------------------------------------------------  Cardiac Enzymes  Recent Labs Lab 10/12/14 0508  TROPONINI <0.03   ------------------------------------------------------------------------------------------------------------------  RADIOLOGY:  Dg Chest 2 View  10/11/2014   CLINICAL DATA:  Chest pain  EXAM: CHEST  2 VIEW  COMPARISON:  None.  FINDINGS: Mild cardiomegaly. There is a gas-filled lower mediastinal mass consistent with moderate hiatal hernia. Normal aortic and hilar contours. There is no edema, consolidation, effusion, or pneumothorax. Exaggerated thoracic kyphosis without compression fracture.  IMPRESSION: 1. No acute findings. 2. Mild cardiomegaly. 3. Moderate hiatal hernia.   Electronically Signed   By: Monte Fantasia M.D.   On: 10/11/2014 18:06    EKG:   Orders placed or performed during the hospital encounter of 10/11/14  . ED EKG (<53mins upon arrival to the ED)  . ED EKG (<68mins upon arrival to the ED)  . EKG 12-Lead  . EKG 12-Lead    ASSESSMENT AND PLAN:   81 year old female with past medical history significant for hypertension and gastroesophageal reflux disease with hiatal hernia presents to the hospital secondary to chest pain.  #1 chest pain- atypical chest pain. Could be from her hiatal hernia. However with her risk factors was admitted  to rule out cardiac causes. Troponins are negative  so far. Has a Myoview scheduled for this morning. If Myoview is negative will discharge her home. Currently asymptomatic. On aspirin and continue nitroglycerin at this time. Also started on statin here.  #2 hypertension-continue her losartan.  #3 hypothyroidism-on Synthroid  #4 gastroesophageal reflux disease-on Protonix. Also has history of hiatal hernia.  #5 DVT prophylaxis-subcutaneous heparin.   ADDENDUM- Stress test cancelled due to non availability of the dye, rescheduled for tomorrow AM  All the records are reviewed and case discussed with Care Management/Social Workerr. Management plans discussed with the patient, family and they are in agreement.  CODE STATUS: Full code  TOTAL TIME TAKING CARE OF THIS PATIENT: 38 minutes.   POSSIBLE D/C today, DEPENDING ON CLINICAL CONDITION.   Gladstone Lighter M.D on 10/12/2014 at 11:34 AM  Between 7am to 6pm - Pager - [phone]  After 6pm go to [email or web address] - password [email or web address] [facility]  Office  [phone]  CC: Primary care physician; [name], MD

## 2014-10-13 ENCOUNTER — Observation Stay: Admit: 2014-10-13 | Payer: Commercial Managed Care - HMO

## 2014-10-13 ENCOUNTER — Ambulatory Visit: Payer: Commercial Managed Care - HMO

## 2014-10-13 ENCOUNTER — Encounter: Payer: Commercial Managed Care - HMO | Attending: Internal Medicine

## 2014-10-13 ENCOUNTER — Other Ambulatory Visit: Payer: Commercial Managed Care - HMO

## 2014-10-13 ENCOUNTER — Other Ambulatory Visit (HOSPITAL_COMMUNITY): Payer: Commercial Managed Care - HMO

## 2014-10-13 DIAGNOSIS — Z885 Allergy status to narcotic agent status: Secondary | ICD-10-CM | POA: Diagnosis not present

## 2014-10-13 DIAGNOSIS — Z951 Presence of aortocoronary bypass graft: Secondary | ICD-10-CM | POA: Insufficient documentation

## 2014-10-13 DIAGNOSIS — E785 Hyperlipidemia, unspecified: Secondary | ICD-10-CM

## 2014-10-13 DIAGNOSIS — I209 Angina pectoris, unspecified: Secondary | ICD-10-CM | POA: Diagnosis not present

## 2014-10-13 DIAGNOSIS — M40204 Unspecified kyphosis, thoracic region: Secondary | ICD-10-CM | POA: Diagnosis not present

## 2014-10-13 DIAGNOSIS — I10 Essential (primary) hypertension: Secondary | ICD-10-CM | POA: Insufficient documentation

## 2014-10-13 DIAGNOSIS — R079 Chest pain, unspecified: Secondary | ICD-10-CM | POA: Diagnosis not present

## 2014-10-13 DIAGNOSIS — K219 Gastro-esophageal reflux disease without esophagitis: Secondary | ICD-10-CM | POA: Diagnosis not present

## 2014-10-13 DIAGNOSIS — E039 Hypothyroidism, unspecified: Secondary | ICD-10-CM | POA: Diagnosis not present

## 2014-10-13 DIAGNOSIS — I2510 Atherosclerotic heart disease of native coronary artery without angina pectoris: Secondary | ICD-10-CM | POA: Diagnosis not present

## 2014-10-13 DIAGNOSIS — K449 Diaphragmatic hernia without obstruction or gangrene: Secondary | ICD-10-CM | POA: Diagnosis not present

## 2014-10-13 LAB — NM MYOCAR MULTI W/SPECT W/WALL MOTION / EF
CHL CUP STRESS STAGE 2 GRADE: 0 %
CHL CUP STRESS STAGE 2 HR: 67 {beats}/min
CHL CUP STRESS STAGE 2 SPEED: 0 mph
CHL CUP STRESS STAGE 3 HR: 67 {beats}/min
CHL CUP STRESS STAGE 5 DBP: 42 mmHg
CHL CUP STRESS STAGE 5 GRADE: 0 %
CHL CUP STRESS STAGE 5 HR: 84 {beats}/min
CHL CUP STRESS STAGE 5 SBP: 118 mmHg
Estimated workload: 1 METS
LV sys vol: 74 mL
LVDIAVOL: 123 mL
NUC STRESS EF: 41 %
Peak HR: 84 {beats}/min
Percent of predicted max HR: 62 %
Rest HR: 67 {beats}/min
SDS: 5
SRS: 22
SSS: 18
Stage 1 HR: 67 {beats}/min
Stage 3 Grade: 0 %
Stage 3 Speed: 0 mph
Stage 4 Grade: 0 %
Stage 4 HR: 84 {beats}/min
Stage 4 Speed: 0 mph
Stage 5 Speed: 0 mph
TID: 1.14

## 2014-10-13 MED ORDER — SODIUM CHLORIDE 0.9 % WEIGHT BASED INFUSION
3.0000 mL/kg/h | INTRAVENOUS | Status: AC
  Administered 2014-10-14: 3 mL/kg/h via INTRAVENOUS

## 2014-10-13 MED ORDER — TECHNETIUM TC 99M SESTAMIBI GENERIC - CARDIOLITE
13.0000 | Freq: Once | INTRAVENOUS | Status: AC | PRN
  Administered 2014-10-13: 13.472 via INTRAVENOUS

## 2014-10-13 MED ORDER — REGADENOSON 0.4 MG/5ML IV SOLN
0.4000 mg | Freq: Once | INTRAVENOUS | Status: AC
  Administered 2014-10-13: 0.4 mg via INTRAVENOUS
  Filled 2014-10-13: qty 5

## 2014-10-13 MED ORDER — MAGNESIUM HYDROXIDE 400 MG/5ML PO SUSP
ORAL | Status: AC
  Administered 2014-10-13: 30 mL via ORAL
  Filled 2014-10-13: qty 30

## 2014-10-13 MED ORDER — SODIUM CHLORIDE 0.9 % IJ SOLN
3.0000 mL | Freq: Two times a day (BID) | INTRAMUSCULAR | Status: DC
  Administered 2014-10-13: 3 mL via INTRAVENOUS

## 2014-10-13 MED ORDER — TECHNETIUM TC 99M SESTAMIBI GENERIC - CARDIOLITE
33.0000 | Freq: Once | INTRAVENOUS | Status: AC | PRN
  Administered 2014-10-13: 33.418 via INTRAVENOUS

## 2014-10-13 MED ORDER — SODIUM CHLORIDE 0.9 % WEIGHT BASED INFUSION
1.0000 mL/kg/h | INTRAVENOUS | Status: DC
  Administered 2014-10-14: 1 mL/kg/h via INTRAVENOUS

## 2014-10-13 MED ORDER — AMLODIPINE BESYLATE 5 MG PO TABS
5.0000 mg | ORAL_TABLET | Freq: Every day | ORAL | Status: DC
  Administered 2014-10-13: 5 mg via ORAL
  Filled 2014-10-13: qty 1

## 2014-10-13 MED ORDER — AMLODIPINE BESYLATE 5 MG PO TABS: 5.0000 mg | ORAL_TABLET | Freq: Every day | ORAL | Status: DC

## 2014-10-13 MED ORDER — SODIUM CHLORIDE 0.9 % IV SOLN: 250.0000 mL | INTRAVENOUS | Status: DC | PRN

## 2014-10-13 MED ORDER — MAGNESIUM HYDROXIDE 400 MG/5ML PO SUSP
30.0000 mL | Freq: Once | ORAL | Status: AC
  Administered 2014-10-13 (×2): 30 mL via ORAL

## 2014-10-13 MED ORDER — SODIUM CHLORIDE 0.9 % IJ SOLN: 3.0000 mL | INTRAMUSCULAR | Status: DC | PRN

## 2014-10-13 MED ORDER — ASPIRIN 81 MG PO CHEW
81.0000 mg | CHEWABLE_TABLET | ORAL | Status: DC
  Filled 2014-10-13: qty 1

## 2014-10-13 NOTE — Consult Note (Signed)
Cardiology Consultation Note  Patient ID: Jenna Marshall, MRN: [id number], DOB/AGE: 1926/05/25 81 y.o. Admit date: 10/11/2014   Date of Consult: 10/13/2014 Primary Physician: [name], MD Primary Cardiologist:   Chief Complaint: Chest pain, arm pain Reason for Consult: Positive stress test  HPI: 81 y.o. female with a known history of essential hypertension, hyperlipidemia who  presented with chest pain. She has had intermittent chest pain for several months that has been getting worse . Symptoms significant now with exertion . Radiating to her arms bilaterally . Pain was severe yesterday and she presented to the emergency room. Symptoms presented at rest, retrosternal in location, radiation to left shoulder,  7-8 in intensity no further worsening or relieving factors. She denies any associated symptoms at that time including shortness of breath, diaphoresis, nausea. She is symptom-free since her admission.  She was scheduled for stress testing which was done this morning that showed regions of ischemia and depressed ejection fraction. Case was discussed with hospitalist service and they requested a consult for further evaluation and management.    Past Medical History  Diagnosis Date  . Hypertension   . Shortness of breath dyspnea   . Hyperlipemia       Most Recent Cardiac Studies: Myoview stress test showing mid anterior to apical wall ischemia, inferolateral wall ischemia with mall motion abnormalities, mild to moderately depressed ejection fraction    Surgical History: History reviewed. No pertinent past surgical history.   Home Meds: Prior to Admission medications   Medication Sig Start Date End Date Taking? Authorizing Provider  Calcium Carb-Cholecalciferol 600-200 MG-UNIT TABS Take 1 tablet by mouth daily.   Yes Historical Provider, MD  cyanocobalamin 1000 MCG tablet Take 100 mcg by mouth daily.   Yes Historical Provider, MD  diclofenac (VOLTAREN) 50 MG EC tablet Take 50  mg by mouth 2 (two) times daily.   Yes Historical Provider, MD  levothyroxine (SYNTHROID, LEVOTHROID) 25 MCG tablet Take 25 mcg by mouth daily before breakfast.   Yes Historical Provider, MD  losartan (COZAAR) 100 MG tablet Take 100 mg by mouth daily.   Yes Historical Provider, MD  Multiple Vitamin (MULTIVITAMIN WITH MINERALS) TABS tablet Take 1 tablet by mouth daily.   Yes Historical Provider, MD  oxybutynin (DITROPAN) 5 MG tablet Take 5 mg by mouth 3 (three) times daily.   Yes Historical Provider, MD  pantoprazole (PROTONIX) 40 MG tablet Take 40 mg by mouth daily.   Yes Historical Provider, MD  simvastatin (ZOCOR) 40 MG tablet Take 40 mg by mouth at bedtime.   Yes Historical Provider, MD  amLODipine (NORVASC) 5 MG tablet Take 1 tablet (5 mg total) by mouth daily. 10/13/14   Gladstone Lighter, MD    Inpatient Medications:  . amLODipine  5 mg Oral Daily  . [START ON 10/14/2014] aspirin  81 mg Oral Pre-Cath  . aspirin  325 mg Oral Daily  . calcium-vitamin D  1 tablet Oral Daily  . heparin  5,000 Units Subcutaneous 3 times per day  . levothyroxine  25 mcg Oral QAC breakfast  . losartan  100 mg Oral Daily  . multivitamin with minerals  1 tablet Oral Daily  . oxybutynin  5 mg Oral TID  . pantoprazole  40 mg Oral Daily  . simvastatin  40 mg Oral QHS  . sodium chloride  3 mL Intravenous Q12H  . cyanocobalamin  100 mcg Oral Daily   . [START ON 10/14/2014] sodium chloride     Followed by  . [  START ON 10/14/2014] sodium chloride      Allergies:  Allergies  Allergen Reactions  . Codeine Nausea And Vomiting    History   Social History  . Marital Status: Widowed    Spouse Name: N/A  . Number of Children: N/A  . Years of Education: N/A   Occupational History  . Not on file.   Social History Main Topics  . Smoking status: Never Smoker   . Smokeless tobacco: Not on file  . Alcohol Use: No  . Drug Use: Not on file  . Sexual Activity: Not on file   Other Topics Concern  . Not on  file   Social History Narrative     Family History  Problem Relation Age of Onset  . Coronary artery disease Mother   . Coronary artery disease Brother      Review of Systems General: negative for chills, fever, night sweats or weight changes.  Cardiovascular: Positive for  chest pain,  negative for edema, orthopnea, palpitations, paroxysmal nocturnal dyspnea, shortness of breath or dyspnea on exertion Dermatological: negative for rash Respiratory: negative for cough or wheezing Urologic: negative for hematuria Abdominal: negative for nausea, vomiting, diarrhea, bright red blood per rectum, melena, or hematemesis Neurologic: negative for visual changes, syncope, or dizziness All other systems reviewed and are otherwise negative except as noted above.  Labs:  Recent Labs  10/11/14 1729 10/11/14 2205 10/12/14 0508 10/12/14 1116  TROPONINI 0.04* <0.03 <0.03 0.03   Lab Results  Component Value Date   WBC 7.8 10/11/2014   HGB 12.5 10/11/2014   HCT 37.7 10/11/2014   MCV 96.3 10/11/2014   PLT 145* 10/11/2014    Recent Labs Lab 10/11/14 1729  NA 138  K 4.5  CL 104  CO2 27  BUN 33*  CREATININE 0.92  CALCIUM 9.1  GLUCOSE 91   No results found for: CHOL, HDL, LDLCALC, TRIG No results found for: DDIMER  Radiology/Studies:  Dg Chest 2 View  10/11/2014   CLINICAL DATA:  Chest pain  EXAM: CHEST  2 VIEW  COMPARISON:  None.  FINDINGS: Mild cardiomegaly. There is a gas-filled lower mediastinal mass consistent with moderate hiatal hernia. Normal aortic and hilar contours. There is no edema, consolidation, effusion, or pneumothorax. Exaggerated thoracic kyphosis without compression fracture.  IMPRESSION: 1. No acute findings. 2. Mild cardiomegaly. 3. Moderate hiatal hernia.   Electronically Signed   By: Monte Fantasia M.D.   On: 10/11/2014 18:06   Nm Myocar Multi W/spect W/wall Motion / Ef  10/13/2014    Pharmacological myocardial perfusion imaging study with moderate sized,   moderate in severity ischemia noted in the mid to apical anterior wall and  mid to apical lateral and inferolateral wall.  The left ventricular ejection fraction is moderately decreased (30-44%).  EF estimated at 41%  Defect 1: There is a medium defect of moderate severity present in the  basal inferolateral, mid anterior, mid inferolateral, mid anterolateral,  apical anterior and apex location.  T wave inversion was noted during rest and stress in the inferior leads  (aVF, II and III). No EKG changes concerning for ischemia.  This is a high risk study.     EKG: Normal sinus rhythm with rate in the 70s, no significant ST changes, nonspecific T-wave abnormality  Weights: Filed Weights   10/11/14 1723 10/11/14 2233 10/13/14 0328  Weight: 180 lb (81.647 kg) 188 lb 11.2 oz (85.594 kg) 187 lb 6.4 oz (85.004 kg)     Physical Exam: Blood  pressure 146/44, pulse 63, temperature 98.1 F (36.7 C), temperature source Oral, resp. rate 18, height 5\' 4"  (1.626 m), weight 187 lb 6.4 oz (85.004 kg), SpO2 96 %. Body mass index is 32.15 kg/(m^2). General: Well developed, well nourished, in no acute distress. Head: Normocephalic, atraumatic, sclera non-icteric, no xanthomas, nares are without discharge.  Neck: Negative for carotid bruits. JVD not elevated. Lungs: Clear bilaterally to auscultation without wheezes, rales, or rhonchi. Breathing is unlabored. Heart: RRR with S1 S2. No murmurs, rubs, or gallops appreciated. Abdomen: Soft, non-tender, non-distended with normoactive bowel sounds. No hepatomegaly. No rebound/guarding. No obvious abdominal masses. Msk:  Strength and tone appear normal for age. Extremities: No clubbing or cyanosis. No edema.  Distal pedal pulses are 2+ and equal bilaterally. Neuro: Alert and oriented X 3. No facial asymmetry. No focal deficit. Moves all extremities spontaneously. Psych:  Responds to questions appropriately with a normal affect.    Assessment and Plan:  46 her  Caucasian female with history of essential hypertension, hyperlipidemia and late family onset coronary artery disease presenting with angina, positive stress test concerning for ischemia  1. Angina Negative cardiac enzymes, chest pain has resolved Stress test results reviewed with the patient today which shows moderate sized regions of ischemia anterior wall, lateral wall Given patient's classic symptoms of angina, we have recommended cardiac catheterization She agrees with above. We will schedule this for tomorrow May 13, second case  She is aware of risk and benefit,  including stroke and heart attack. Consent will be obtained by nursing   2. Hypertension essential:  Continue Cozaar  3. Hyperlipidemia unspecified:  We'll LDL less than 70 Continue Zocor  4. Gastroesophageal reflux disease without esophagitis:  PPI therapy  5. Hypothyroidism unspecified:  Synthroid  6.Venous thromboembolism prophylactic:  Heparin subcutaneous  Signed, Ida Rogue M.D., Ph.D. 10/13/2014, 7:09 PM

## 2014-10-13 NOTE — Progress Notes (Addendum)
[place] at [place] NAME: Jenna Marshall    MR#:  [id number]  DATE OF BIRTH:  November 02, 1925  SUBJECTIVE:  CHIEF COMPLAINT:   Chief Complaint  Patient presents with  . Chest Pain   no chest pain. For my review today.  REVIEW OF SYSTEMS:  Review of Systems  Constitutional: Negative for fever and chills.  Respiratory: Negative for cough, shortness of breath and wheezing.   Cardiovascular: Negative for chest pain and palpitations.  Gastrointestinal: Negative for nausea, vomiting, abdominal pain, diarrhea and constipation.  Genitourinary: Negative for dysuria.  Neurological: Negative for dizziness, seizures and headaches.    DRUG ALLERGIES:   Allergies  Allergen Reactions  . Codeine Nausea And Vomiting    VITALS:  Blood pressure 153/56, pulse 65, temperature 97.5 F (36.4 C), temperature source Oral, resp. rate 20, height 5\' 4"  (1.626 m), weight 85.004 kg (187 lb 6.4 oz), SpO2 96 %.  PHYSICAL EXAMINATION:  Physical Exam  GENERAL:  81 y.o.-year-old patient lying in the bed with no acute distress.  EYES: Pupils equal, round, reactive to light and accommodation. No scleral icterus. Extraocular muscles intact.  HEENT: Head atraumatic, normocephalic. Oropharynx and nasopharynx clear.  NECK:  Supple, no jugular venous distention. No thyroid enlargement, no tenderness.  LUNGS: Normal breath sounds bilaterally, no wheezing, rales,rhonchi or crepitation. No use of accessory muscles of respiration.  CARDIOVASCULAR: S1, S2 normal. 3/6 systolic murmur present, no rubs, or gallops.  ABDOMEN: Soft, nontender, nondistended. Bowel sounds present. No organomegaly or mass.  EXTREMITIES: No pedal edema, cyanosis, or clubbing.  NEUROLOGIC: Cranial nerves II through XII are intact. Muscle strength 5/5 in all extremities. Sensation intact. Gait not checked.  PSYCHIATRIC: The patient is alert and oriented x 3.  SKIN: No obvious rash, lesion, or ulcer.     LABORATORY PANEL:   CBC  Recent Labs Lab 10/11/14 1729  WBC 7.8  HGB 12.5  HCT 37.7  PLT 145*   ------------------------------------------------------------------------------------------------------------------  Chemistries   Recent Labs Lab 10/11/14 1729  NA 138  K 4.5  CL 104  CO2 27  GLUCOSE 91  BUN 33*  CREATININE 0.92  CALCIUM 9.1   ------------------------------------------------------------------------------------------------------------------  Cardiac Enzymes  Recent Labs Lab 10/12/14 1116  TROPONINI 0.03   ------------------------------------------------------------------------------------------------------------------  RADIOLOGY:  Dg Chest 2 View  10/11/2014   CLINICAL DATA:  Chest pain  EXAM: CHEST  2 VIEW  COMPARISON:  None.  FINDINGS: Mild cardiomegaly. There is a gas-filled lower mediastinal mass consistent with moderate hiatal hernia. Normal aortic and hilar contours. There is no edema, consolidation, effusion, or pneumothorax. Exaggerated thoracic kyphosis without compression fracture.  IMPRESSION: 1. No acute findings. 2. Mild cardiomegaly. 3. Moderate hiatal hernia.   Electronically Signed   By: Monte Fantasia M.D.   On: 10/11/2014 18:06    EKG:   Orders placed or performed during the hospital encounter of 10/11/14  . ED EKG (<46mins upon arrival to the ED)  . ED EKG (<36mins upon arrival to the ED)  . EKG 12-Lead  . EKG 12-Lead    ASSESSMENT AND PLAN:   81 year old female with past medical history significant for hypertension and gastroesophageal reflux disease with hiatal hernia presents to the hospital secondary to chest pain.  #1 chest pain- atypical chest pain. Could be from her hiatal hernia. However with her risk factors was admitted to rule out cardiac causes. Troponins are negative so far. Has a Myoview scheduled for this morning. If Myoview is negative will  discharge her home. Currently asymptomatic. On aspirin and continue  nitroglycerin at this time. Also started on statin here.  #2 hypertension-continue her losartan.  #3 hypothyroidism-on Synthroid  #4 gastroesophageal reflux disease-on Protonix. Also has history of hiatal hernia.  #5 DVT prophylaxis-subcutaneous heparin.     All the records are reviewed and case discussed with Care Management/Social Workerr. Management plans discussed with the patient, family and they are in agreement.  CODE STATUS: Full code  TOTAL TIME TAKING CARE OF THIS PATIENT: 38 minutes.   POSSIBLE D/C today, if stress test is negative.   Gladstone Lighter M.D on 10/13/2014 at 10:45 AM  Between 7am to 6pm - Pager - [phone]  After 6pm go to [email or web address] - password [email or web address] [facility]  Office  [phone]  CC: Primary care physician; [name], MD   Addendum-patient's Myoview is positive. Cardiology consult requested. Echocardiogram is pending. Likely proceed with cardiac catheterization tomorrow morning.

## 2014-10-13 NOTE — Plan of Care (Addendum)
Problem: Phase I Progression Outcomes Goal: Other Phase I Outcomes/Goals Outcome: Progressing Patient has an episode of symptomatic hypotension. The MD was contacted and 217mL bolus NS was administered per order. Post NS bolus patient has an improved BP .Patient was informed about a rescheduled stress test in the AM ans was kept NPO.  Patient was stable overnight w/o any distress. Patient needed items were kept within patient's reach and was provided bathroom

## 2014-10-13 NOTE — Discharge Summary (Addendum)
[name]    MR#:  [id number]  DATE OF BIRTH:  10/28/1925  DATE OF ADMISSION:  10/11/2014 ADMITTING PHYSICIAN: Lytle Butte, MD  DATE OF DISCHARGE: No discharge date for patient encounter.  PRIMARY CARE PHYSICIAN: [name], MD    ADMISSION DIAGNOSIS:  NSTEMI (non-ST elevated myocardial infarction) [I21.4] Ischemic chest pain [I20.9]  DISCHARGE DIAGNOSIS:  Principal Problem:   Chest pain, central Active Problems:   Essential hypertension   Hyperlipidemia   Gastroesophageal reflux disease without esophagitis   Hypothyroidism CAD  SECONDARY DIAGNOSIS:   Past Medical History  Diagnosis Date  . Hypertension   . Shortness of breath dyspnea   . Hyperlipemia     HOSPITAL COURSE:   81 year old female with past medical history significant for hypertension and gastroesophageal reflux disease with hiatal hernia presents to the hospital secondary to chest pain.  #1 unstable angina-troponins negative on admission. Myoview done yesterday was normal. For cardiac catheterization today. Cardiac cath done this morning revealed critical left main stenosis and significant distal RCA stenosis with heavily calcified arteries. Ejection fraction is normal by echocardiogram. Patient will be transferred to [facility] for possible bypass graft surgery today. On aspirin and continue nitroglycerin at this time. Also started on statin here.  #2 hypertension-on Norvasc and losartan.  #3 hypothyroidism-on Synthroid  #4 gastroesophageal reflux disease-on Protonix. Also has history of hiatal hernia.  DISCHARGE CONDITIONS:   Stable - Being transferred to [facility] for CABG  CONSULTS OBTAINED:    1. Cardiology consultation by Dr. Rockey Situ  DRUG ALLERGIES:   Allergies  Allergen Reactions  . Codeine Nausea And Vomiting    DISCHARGE MEDICATIONS:   Current Discharge Medication List    START taking these  medications   Details  amLODipine (NORVASC) 5 MG tablet Take 1 tablet (5 mg total) by mouth daily. Qty: 30 tablet, Refills: 1      CONTINUE these medications which have NOT CHANGED   Details  Calcium Carb-Cholecalciferol 600-200 MG-UNIT TABS Take 1 tablet by mouth daily.    cyanocobalamin 1000 MCG tablet Take 100 mcg by mouth daily.    diclofenac (VOLTAREN) 50 MG EC tablet Take 50 mg by mouth 2 (two) times daily.    levothyroxine (SYNTHROID, LEVOTHROID) 25 MCG tablet Take 25 mcg by mouth daily before breakfast.    losartan (COZAAR) 100 MG tablet Take 100 mg by mouth daily.    Multiple Vitamin (MULTIVITAMIN WITH MINERALS) TABS tablet Take 1 tablet by mouth daily.    oxybutynin (DITROPAN) 5 MG tablet Take 5 mg by mouth 3 (three) times daily.    pantoprazole (PROTONIX) 40 MG tablet Take 40 mg by mouth daily.    simvastatin (ZOCOR) 40 MG tablet Take 40 mg by mouth at bedtime.         DISCHARGE INSTRUCTIONS:    If you experience worsening of your admission symptoms, develop shortness of breath, life threatening emergency, suicidal or homicidal thoughts you must seek medical attention immediately by calling 911 or calling your MD immediately  if symptoms less severe.  You Must read complete instructions/literature along with all the possible adverse reactions/side effects for all the Medicines you take and that have been prescribed to you. Take any new Medicines after you have completely understood and accept all the possible adverse reactions/side effects.   Please note  You were cared for by a hospitalist during your hospital stay. If you have any questions  about your discharge medications or the care you received while you were in the hospital after you are discharged, you can call the unit and asked to speak with the hospitalist on call if the hospitalist that took care of you is not available. Once you are discharged, your primary care physician will handle any further medical  issues. Please note that NO REFILLS for any discharge medications will be authorized once you are discharged, as it is imperative that you return to your primary care physician (or establish a relationship with a primary care physician if you do not have one) for your aftercare needs so that they can reassess your need for medications and monitor your lab values.    Today   CHIEF COMPLAINT:   Chief Complaint  Patient presents with  . Chest Pain     VITAL SIGNS:  Blood pressure 153/56, pulse 65, temperature 97.5 F (36.4 C), temperature source Oral, resp. rate 20, height 5\' 4"  (1.626 m), weight 85.004 kg (187 lb 6.4 oz), SpO2 96 %.  I/O:   Intake/Output Summary (Last 24 hours) at 10/13/14 1050 Last data filed at 10/13/14 0330  Gross per 24 hour  Intake    690 ml  Output    400 ml  Net    290 ml    PHYSICAL EXAMINATION:   Physical Exam  GENERAL:  81 y.o.-year-old patient lying in the bed with no acute distress.  EYES: Pupils equal, round, reactive to light and accommodation. No scleral icterus. Extraocular muscles intact.  HEENT: Head atraumatic, normocephalic. Oropharynx and nasopharynx clear.  NECK:  Supple, no jugular venous distention. No thyroid enlargement, no tenderness.  LUNGS: Normal breath sounds bilaterally, no wheezing, rales,rhonchi or crepitation. No use of accessory muscles of respiration.  CARDIOVASCULAR: S1, S2 normal. No murmurs, rubs, or gallops.  ABDOMEN: Soft, non-tender, non-distended. Bowel sounds present. No organomegaly or mass.  EXTREMITIES: No pedal edema, cyanosis, or clubbing.  NEUROLOGIC: Cranial nerves II through XII are intact. Muscle strength 5/5 in all extremities. Sensation intact. Gait not checked.  PSYCHIATRIC: The patient is alert and oriented x 3.  SKIN: No obvious rash, lesion, or ulcer.   DATA REVIEW:   CBC  Recent Labs Lab 10/11/14 1729  WBC 7.8  HGB 12.5  HCT 37.7  PLT 145*    Chemistries   Recent Labs Lab  10/11/14 1729  NA 138  K 4.5  CL 104  CO2 27  GLUCOSE 91  BUN 33*  CREATININE 0.92  CALCIUM 9.1    Cardiac Enzymes  Recent Labs Lab 10/12/14 1116  TROPONINI 0.03    Microbiology Results  No results found for this or any previous visit.  RADIOLOGY:  Dg Chest 2 View  10/11/2014   CLINICAL DATA:  Chest pain  EXAM: CHEST  2 VIEW  COMPARISON:  None.  FINDINGS: Mild cardiomegaly. There is a gas-filled lower mediastinal mass consistent with moderate hiatal hernia. Normal aortic and hilar contours. There is no edema, consolidation, effusion, or pneumothorax. Exaggerated thoracic kyphosis without compression fracture.  IMPRESSION: 1. No acute findings. 2. Mild cardiomegaly. 3. Moderate hiatal hernia.   Electronically Signed   By: Monte Fantasia M.D.   On: 10/11/2014 18:06    EKG:   Orders placed or performed during the hospital encounter of 10/11/14  . ED EKG (<15mins upon arrival to the ED)  . ED EKG (<77mins upon arrival to the ED)  . EKG 12-Lead  . EKG 12-Lead      Management plans  discussed with the patient, family and they are in agreement.  CODE STATUS:     Code Status Orders        Start     Ordered   10/11/14 2040  Full code   Continuous     10/11/14 2039      TOTAL TIME TAKING CARE OF THIS PATIENT:40  minutes.    Gladstone Lighter M.D on 10/13/2014 at 10:50 AM  Between 7am to 6pm - Pager - [phone]  After 6pm go to [email or web address] - password [email or web address] [facility]  Office  [phone]  CC: Primary care physician; [name], MD

## 2014-10-14 ENCOUNTER — Inpatient Hospital Stay (HOSPITAL_COMMUNITY): Payer: Commercial Managed Care - HMO

## 2014-10-14 ENCOUNTER — Encounter (HOSPITAL_COMMUNITY)
Admission: AD | Disposition: A | Discharge status: Skilled Nursing Facility | Payer: Commercial Managed Care - HMO | Source: Other Acute Inpatient Hospital | Attending: Surgery

## 2014-10-14 ENCOUNTER — Observation Stay (HOSPITAL_COMMUNITY)
Admit: 2014-10-14 | Discharge: 2014-10-14 | Disposition: A | Discharge status: Home / Self Care | Payer: Commercial Managed Care - HMO | Attending: Internal Medicine | Admitting: Internal Medicine

## 2014-10-14 ENCOUNTER — Encounter: Payer: Self-pay | Admitting: Physician Assistant

## 2014-10-14 ENCOUNTER — Inpatient Hospital Stay (HOSPITAL_COMMUNITY)
Admission: AD | Admit: 2014-10-14 | Discharge: 2014-10-21 | DRG: 236 | Disposition: A | Discharge status: Skilled Nursing Facility | Payer: Commercial Managed Care - HMO | Source: Other Acute Inpatient Hospital | Attending: Surgery | Admitting: Surgery

## 2014-10-14 ENCOUNTER — Encounter
Admission: EM | Disposition: A | Discharge status: Other Acute Inpatient Hospital | Payer: Commercial Managed Care - HMO | Source: Home / Self Care | Attending: Student

## 2014-10-14 ENCOUNTER — Encounter: Payer: Self-pay | Admitting: *Deleted

## 2014-10-14 ENCOUNTER — Inpatient Hospital Stay (HOSPITAL_COMMUNITY): Payer: Commercial Managed Care - HMO | Admitting: Anesthesiology

## 2014-10-14 ENCOUNTER — Encounter (HOSPITAL_COMMUNITY): Payer: Self-pay | Admitting: Certified Registered Nurse Anesthetist

## 2014-10-14 DIAGNOSIS — Z48812 Encounter for surgical aftercare following surgery on the circulatory system: Secondary | ICD-10-CM | POA: Diagnosis not present

## 2014-10-14 DIAGNOSIS — Z4682 Encounter for fitting and adjustment of non-vascular catheter: Secondary | ICD-10-CM | POA: Diagnosis not present

## 2014-10-14 DIAGNOSIS — M40204 Unspecified kyphosis, thoracic region: Secondary | ICD-10-CM | POA: Diagnosis not present

## 2014-10-14 DIAGNOSIS — Z8249 Family history of ischemic heart disease and other diseases of the circulatory system: Secondary | ICD-10-CM

## 2014-10-14 DIAGNOSIS — Z452 Encounter for adjustment and management of vascular access device: Secondary | ICD-10-CM | POA: Diagnosis not present

## 2014-10-14 DIAGNOSIS — I10 Essential (primary) hypertension: Secondary | ICD-10-CM | POA: Diagnosis present

## 2014-10-14 DIAGNOSIS — Z7982 Long term (current) use of aspirin: Secondary | ICD-10-CM

## 2014-10-14 DIAGNOSIS — K219 Gastro-esophageal reflux disease without esophagitis: Secondary | ICD-10-CM | POA: Diagnosis not present

## 2014-10-14 DIAGNOSIS — K449 Diaphragmatic hernia without obstruction or gangrene: Secondary | ICD-10-CM | POA: Diagnosis not present

## 2014-10-14 DIAGNOSIS — I5189 Other ill-defined heart diseases: Secondary | ICD-10-CM | POA: Diagnosis present

## 2014-10-14 DIAGNOSIS — Z79899 Other long term (current) drug therapy: Secondary | ICD-10-CM

## 2014-10-14 DIAGNOSIS — I351 Nonrheumatic aortic (valve) insufficiency: Secondary | ICD-10-CM

## 2014-10-14 DIAGNOSIS — Z951 Presence of aortocoronary bypass graft: Secondary | ICD-10-CM | POA: Diagnosis not present

## 2014-10-14 DIAGNOSIS — E8770 Fluid overload, unspecified: Secondary | ICD-10-CM | POA: Diagnosis not present

## 2014-10-14 DIAGNOSIS — D62 Acute posthemorrhagic anemia: Secondary | ICD-10-CM | POA: Diagnosis not present

## 2014-10-14 DIAGNOSIS — J9811 Atelectasis: Secondary | ICD-10-CM | POA: Diagnosis not present

## 2014-10-14 DIAGNOSIS — I200 Unstable angina: Secondary | ICD-10-CM | POA: Diagnosis present

## 2014-10-14 DIAGNOSIS — E785 Hyperlipidemia, unspecified: Secondary | ICD-10-CM | POA: Diagnosis present

## 2014-10-14 DIAGNOSIS — I2510 Atherosclerotic heart disease of native coronary artery without angina pectoris: Secondary | ICD-10-CM | POA: Diagnosis present

## 2014-10-14 DIAGNOSIS — E039 Hypothyroidism, unspecified: Secondary | ICD-10-CM | POA: Diagnosis present

## 2014-10-14 DIAGNOSIS — R079 Chest pain, unspecified: Secondary | ICD-10-CM | POA: Diagnosis not present

## 2014-10-14 DIAGNOSIS — Z9889 Other specified postprocedural states: Secondary | ICD-10-CM | POA: Diagnosis not present

## 2014-10-14 DIAGNOSIS — R918 Other nonspecific abnormal finding of lung field: Secondary | ICD-10-CM | POA: Diagnosis not present

## 2014-10-14 DIAGNOSIS — R001 Bradycardia, unspecified: Secondary | ICD-10-CM | POA: Diagnosis not present

## 2014-10-14 DIAGNOSIS — J90 Pleural effusion, not elsewhere classified: Secondary | ICD-10-CM | POA: Diagnosis not present

## 2014-10-14 DIAGNOSIS — I25110 Atherosclerotic heart disease of native coronary artery with unstable angina pectoris: Secondary | ICD-10-CM | POA: Diagnosis not present

## 2014-10-14 DIAGNOSIS — Z885 Allergy status to narcotic agent status: Secondary | ICD-10-CM | POA: Diagnosis not present

## 2014-10-14 HISTORY — PX: CORONARY ARTERY BYPASS GRAFT: SHX141

## 2014-10-14 HISTORY — PX: CARDIAC CATHETERIZATION: SHX172

## 2014-10-14 LAB — POCT I-STAT, CHEM 8
BUN: 20 mg/dL (ref 6–20)
BUN: 18 mg/dL (ref 6–20)
BUN: 15 mg/dL (ref 6–20)
BUN: 17 mg/dL (ref 6–20)
BUN: 16 mg/dL (ref 6–20)
CHLORIDE: 105 mmol/L (ref 101–111)
CHLORIDE: 102 mmol/L (ref 101–111)
CHLORIDE: 92 mmol/L — AB (ref 101–111)
CHLORIDE: 103 mmol/L (ref 101–111)
CREATININE: 0.7 mg/dL (ref 0.44–1.00)
CREATININE: 0.6 mg/dL (ref 0.44–1.00)
CREATININE: 0.5 mg/dL (ref 0.44–1.00)
Calcium, Ion: 1.19 mmol/L (ref 1.13–1.30)
Calcium, Ion: 1.19 mmol/L (ref 1.13–1.30)
Calcium, Ion: 0.93 mmol/L — ABNORMAL LOW (ref 1.13–1.30)
Calcium, Ion: 1.07 mmol/L — ABNORMAL LOW (ref 1.13–1.30)
Calcium, Ion: 1.09 mmol/L — ABNORMAL LOW (ref 1.13–1.30)
Chloride: 105 mmol/L (ref 101–111)
Creatinine, Ser: 0.7 mg/dL (ref 0.44–1.00)
Creatinine, Ser: 0.7 mg/dL (ref 0.44–1.00)
GLUCOSE: 128 mg/dL — AB (ref 65–99)
GLUCOSE: 88 mg/dL (ref 65–99)
Glucose, Bld: 112 mg/dL — ABNORMAL HIGH (ref 65–99)
Glucose, Bld: 99 mg/dL (ref 65–99)
Glucose, Bld: 125 mg/dL — ABNORMAL HIGH (ref 65–99)
HCT: 33 % — ABNORMAL LOW (ref 36.0–46.0)
HCT: 20 % — ABNORMAL LOW (ref 36.0–46.0)
HCT: 23 % — ABNORMAL LOW (ref 36.0–46.0)
HEMATOCRIT: 29 % — AB (ref 36.0–46.0)
HEMATOCRIT: 23 % — AB (ref 36.0–46.0)
HEMOGLOBIN: 11.2 g/dL — AB (ref 12.0–15.0)
Hemoglobin: 9.9 g/dL — ABNORMAL LOW (ref 12.0–15.0)
Hemoglobin: 6.8 g/dL — CL (ref 12.0–15.0)
Hemoglobin: 7.8 g/dL — ABNORMAL LOW (ref 12.0–15.0)
Hemoglobin: 7.8 g/dL — ABNORMAL LOW (ref 12.0–15.0)
POTASSIUM: 4.5 mmol/L (ref 3.5–5.1)
POTASSIUM: 4.6 mmol/L (ref 3.5–5.1)
Potassium: 4 mmol/L (ref 3.5–5.1)
Potassium: 3.9 mmol/L (ref 3.5–5.1)
Potassium: 4.6 mmol/L (ref 3.5–5.1)
SODIUM: 127 mmol/L — AB (ref 135–145)
SODIUM: 135 mmol/L (ref 135–145)
Sodium: 137 mmol/L (ref 135–145)
Sodium: 139 mmol/L (ref 135–145)
Sodium: 136 mmol/L (ref 135–145)
TCO2: 22 mmol/L (ref 0–100)
TCO2: 21 mmol/L (ref 0–100)
TCO2: 20 mmol/L (ref 0–100)
TCO2: 22 mmol/L (ref 0–100)
TCO2: 19 mmol/L (ref 0–100)

## 2014-10-14 LAB — PROTIME-INR
INR: 1.64 — ABNORMAL HIGH (ref 0.00–1.49)
Prothrombin Time: 19.6 seconds — ABNORMAL HIGH (ref 11.6–15.2)

## 2014-10-14 LAB — CBC
HEMATOCRIT: 29.6 % — AB (ref 36.0–46.0)
Hemoglobin: 10.2 g/dL — ABNORMAL LOW (ref 12.0–15.0)
MCH: 32 pg (ref 26.0–34.0)
MCHC: 34.5 g/dL (ref 30.0–36.0)
MCV: 92.8 fL (ref 78.0–100.0)
Platelets: 114 10*3/uL — ABNORMAL LOW (ref 150–400)
RBC: 3.19 MIL/uL — AB (ref 3.87–5.11)
RDW: 13.1 % (ref 11.5–15.5)
WBC: 14.4 10*3/uL — ABNORMAL HIGH (ref 4.0–10.5)

## 2014-10-14 LAB — SURGICAL PCR SCREEN
MRSA, PCR: NEGATIVE
Staphylococcus aureus: NEGATIVE

## 2014-10-14 LAB — PLATELET COUNT: Platelets: 91 10*3/uL — ABNORMAL LOW (ref 150–400)

## 2014-10-14 LAB — HEMOGLOBIN AND HEMATOCRIT, BLOOD
HCT: 23.7 % — ABNORMAL LOW (ref 36.0–46.0)
Hemoglobin: 8.1 g/dL — ABNORMAL LOW (ref 12.0–15.0)

## 2014-10-14 LAB — POCT I-STAT 3, ART BLOOD GAS (G3+)
Acid-base deficit: 3 mmol/L — ABNORMAL HIGH (ref 0.0–2.0)
Acid-base deficit: 2 mmol/L (ref 0.0–2.0)
Bicarbonate: 22.4 mEq/L (ref 20.0–24.0)
Bicarbonate: 22.2 mEq/L (ref 20.0–24.0)
O2 SAT: 100 %
O2 Saturation: 96 %
PH ART: 7.355 (ref 7.350–7.450)
PH ART: 7.422 (ref 7.350–7.450)
PO2 ART: 359 mmHg — AB (ref 80.0–100.0)
Patient temperature: 36.1
TCO2: 24 mmol/L (ref 0–100)
TCO2: 23 mmol/L (ref 0–100)
pCO2 arterial: 40 mmHg (ref 35.0–45.0)
pCO2 arterial: 33.8 mmHg — ABNORMAL LOW (ref 35.0–45.0)
pO2, Arterial: 75 mmHg — ABNORMAL LOW (ref 80.0–100.0)

## 2014-10-14 LAB — ABO/RH: ABO/RH(D): B NEG

## 2014-10-14 LAB — GLUCOSE, CAPILLARY: GLUCOSE-CAPILLARY: 136 mg/dL — AB (ref 65–99)

## 2014-10-14 LAB — POCT I-STAT 4, (NA,K, GLUC, HGB,HCT)
Glucose, Bld: 106 mg/dL — ABNORMAL HIGH (ref 65–99)
Glucose, Bld: 142 mg/dL — ABNORMAL HIGH (ref 65–99)
HCT: 35 % — ABNORMAL LOW (ref 36.0–46.0)
HEMATOCRIT: 31 % — AB (ref 36.0–46.0)
HEMOGLOBIN: 11.9 g/dL — AB (ref 12.0–15.0)
HEMOGLOBIN: 10.5 g/dL — AB (ref 12.0–15.0)
POTASSIUM: 4.1 mmol/L (ref 3.5–5.1)
Potassium: 4 mmol/L (ref 3.5–5.1)
Sodium: 138 mmol/L (ref 135–145)
Sodium: 137 mmol/L (ref 135–145)

## 2014-10-14 LAB — PREPARE RBC (CROSSMATCH)

## 2014-10-14 LAB — APTT: aPTT: 32 seconds (ref 24–37)

## 2014-10-14 SURGERY — CORONARY ARTERY BYPASS GRAFTING (CABG)
Anesthesia: General | Site: Chest

## 2014-10-14 SURGERY — LEFT HEART CATH AND CORONARY ANGIOGRAPHY
Anesthesia: Moderate Sedation | Laterality: Left

## 2014-10-14 MED ORDER — FENTANYL CITRATE (PF) 100 MCG/2ML IJ SOLN
INTRAMUSCULAR | Status: DC | PRN
  Administered 2014-10-14: 50 ug via INTRAVENOUS

## 2014-10-14 MED ORDER — METOPROLOL TARTRATE 1 MG/ML IV SOLN: 2.5000 mg | INTRAVENOUS | Status: DC | PRN

## 2014-10-14 MED ORDER — SODIUM CHLORIDE 0.9 % IJ SOLN: 3.0000 mL | Freq: Two times a day (BID) | INTRAMUSCULAR | Status: DC

## 2014-10-14 MED ORDER — ACETAMINOPHEN 160 MG/5ML PO SOLN: 1000.0000 mg | Freq: Four times a day (QID) | ORAL | Status: DC

## 2014-10-14 MED ORDER — DOCUSATE SODIUM 100 MG PO CAPS
200.0000 mg | ORAL_CAPSULE | Freq: Every day | ORAL | Status: DC
  Administered 2014-10-15 – 2014-10-18 (×4): 200 mg via ORAL
  Filled 2014-10-14 (×4): qty 2

## 2014-10-14 MED ORDER — ASPIRIN 81 MG PO CHEW: 324.0000 mg | CHEWABLE_TABLET | Freq: Every day | ORAL | Status: DC

## 2014-10-14 MED ORDER — FENTANYL CITRATE (PF) 100 MCG/2ML IJ SOLN
INTRAMUSCULAR | Status: DC | PRN
  Administered 2014-10-14 (×2): 50 ug via INTRAVENOUS
  Administered 2014-10-14: 150 ug via INTRAVENOUS
  Administered 2014-10-14: 50 ug via INTRAVENOUS
  Administered 2014-10-14: 250 ug via INTRAVENOUS
  Administered 2014-10-14: 50 ug via INTRAVENOUS
  Administered 2014-10-14 (×2): 250 ug via INTRAVENOUS
  Administered 2014-10-14: 400 ug via INTRAVENOUS

## 2014-10-14 MED ORDER — ROCURONIUM BROMIDE 100 MG/10ML IV SOLN
INTRAVENOUS | Status: DC | PRN
  Administered 2014-10-14 (×2): 50 mg via INTRAVENOUS

## 2014-10-14 MED ORDER — SUCCINYLCHOLINE CHLORIDE 20 MG/ML IJ SOLN
INTRAMUSCULAR | Status: DC | PRN
  Administered 2014-10-14: 100 mg via INTRAVENOUS

## 2014-10-14 MED ORDER — FENTANYL CITRATE (PF) 250 MCG/5ML IJ SOLN
INTRAMUSCULAR | Status: AC
  Filled 2014-10-14: qty 5

## 2014-10-14 MED ORDER — HEMOSTATIC AGENTS (NO CHARGE) OPTIME
TOPICAL | Status: DC | PRN
  Administered 2014-10-14 (×2): 1 via TOPICAL

## 2014-10-14 MED ORDER — PHENYLEPHRINE HCL 10 MG/ML IJ SOLN
30.0000 ug/min | INTRAVENOUS | Status: AC
  Administered 2014-10-14: 5 ug/min via INTRAVENOUS
  Filled 2014-10-14: qty 2

## 2014-10-14 MED ORDER — ROCURONIUM BROMIDE 50 MG/5ML IV SOLN
INTRAVENOUS | Status: AC
  Filled 2014-10-14: qty 1

## 2014-10-14 MED ORDER — METOPROLOL TARTRATE 25 MG/10 ML ORAL SUSPENSION
12.5000 mg | Freq: Two times a day (BID) | ORAL | Status: DC
  Filled 2014-10-14 (×3): qty 5

## 2014-10-14 MED ORDER — SODIUM CHLORIDE 0.9 % IV SOLN
INTRAVENOUS | Status: AC
  Administered 2014-10-14: 1 [IU]/h via INTRAVENOUS
  Filled 2014-10-14: qty 2.5

## 2014-10-14 MED ORDER — LEVOTHYROXINE SODIUM 25 MCG PO TABS
25.0000 ug | ORAL_TABLET | Freq: Every day | ORAL | Status: DC
  Administered 2014-10-15 – 2014-10-21 (×7): 25 ug via ORAL
  Filled 2014-10-14 (×8): qty 1

## 2014-10-14 MED ORDER — VANCOMYCIN HCL IN DEXTROSE 1-5 GM/200ML-% IV SOLN
1000.0000 mg | Freq: Once | INTRAVENOUS | Status: AC
Start: 2014-10-15 — End: 2014-10-15
  Administered 2014-10-15: 1000 mg via INTRAVENOUS
  Filled 2014-10-14: qty 200

## 2014-10-14 MED ORDER — ACETAMINOPHEN 160 MG/5ML PO SOLN: 650.0000 mg | Freq: Once | ORAL | Status: AC

## 2014-10-14 MED ORDER — ASPIRIN EC 325 MG PO TBEC
325.0000 mg | DELAYED_RELEASE_TABLET | Freq: Every day | ORAL | Status: DC
  Administered 2014-10-15 – 2014-10-18 (×4): 325 mg via ORAL
  Filled 2014-10-14 (×4): qty 1

## 2014-10-14 MED ORDER — MORPHINE SULFATE 2 MG/ML IJ SOLN: 1.0000 mg | INTRAMUSCULAR | Status: AC | PRN

## 2014-10-14 MED ORDER — ALBUMIN HUMAN 5 % IV SOLN
250.0000 mL | INTRAVENOUS | Status: AC | PRN
  Administered 2014-10-14 – 2014-10-15 (×3): 250 mL via INTRAVENOUS
  Filled 2014-10-14 (×2): qty 250

## 2014-10-14 MED ORDER — LACTATED RINGERS IV SOLN
INTRAVENOUS | Status: DC | PRN
  Administered 2014-10-14 (×3): via INTRAVENOUS

## 2014-10-14 MED ORDER — BISACODYL 10 MG RE SUPP: 10.0000 mg | Freq: Every day | RECTAL | Status: DC

## 2014-10-14 MED ORDER — NITROGLYCERIN IN D5W 200-5 MCG/ML-% IV SOLN: 0.0000 ug/min | INTRAVENOUS | Status: DC

## 2014-10-14 MED ORDER — DEXTROSE 5 % IV SOLN
0.0000 ug/min | INTRAVENOUS | Status: DC
  Filled 2014-10-14: qty 4

## 2014-10-14 MED ORDER — CEFUROXIME SODIUM 750 MG IJ SOLR
750.0000 mg | INTRAMUSCULAR | Status: DC
  Filled 2014-10-14: qty 750

## 2014-10-14 MED ORDER — GLYCOPYRROLATE 0.2 MG/ML IJ SOLN
INTRAMUSCULAR | Status: DC | PRN
Start: 2014-10-14 — End: 2014-10-14
  Administered 2014-10-14 (×2): 0.2 mg via INTRAVENOUS

## 2014-10-14 MED ORDER — SODIUM CHLORIDE 0.9 % IV SOLN
INTRAVENOUS | Status: DC
  Administered 2014-10-14: 1.5 [IU]/h via INTRAVENOUS
  Filled 2014-10-14: qty 2.5

## 2014-10-14 MED ORDER — VERAPAMIL HCL 2.5 MG/ML IV SOLN
INTRAVENOUS | Status: DC | PRN
  Administered 2014-10-14: 09:00:00 via INTRA_ARTERIAL

## 2014-10-14 MED ORDER — DEXMEDETOMIDINE HCL IN NACL 400 MCG/100ML IV SOLN
0.1000 ug/kg/h | INTRAVENOUS | Status: AC
  Administered 2014-10-14: .3 ug/kg/h via INTRAVENOUS
  Filled 2014-10-14: qty 100

## 2014-10-14 MED ORDER — HEPARIN SODIUM (PORCINE) 1000 UNIT/ML IJ SOLN
INTRAMUSCULAR | Status: DC | PRN
  Administered 2014-10-14: 4000 [IU] via INTRAVENOUS

## 2014-10-14 MED ORDER — MIDAZOLAM HCL 5 MG/5ML IJ SOLN
INTRAMUSCULAR | Status: DC | PRN
  Administered 2014-10-14: 1 mg via INTRAVENOUS
  Administered 2014-10-14: 5 mg via INTRAVENOUS
  Administered 2014-10-14: 3 mg via INTRAVENOUS
  Administered 2014-10-14: 1 mg via INTRAVENOUS

## 2014-10-14 MED ORDER — TRAMADOL HCL 50 MG PO TABS
50.0000 mg | ORAL_TABLET | ORAL | Status: DC | PRN
  Administered 2014-10-15 – 2014-10-18 (×4): 100 mg via ORAL
  Filled 2014-10-14 (×4): qty 2

## 2014-10-14 MED ORDER — PLASMA-LYTE 148 IV SOLN
INTRAVENOUS | Status: AC
  Administered 2014-10-14: 500 mL
  Filled 2014-10-14: qty 2.5

## 2014-10-14 MED ORDER — ARTIFICIAL TEARS OP OINT
TOPICAL_OINTMENT | OPHTHALMIC | Status: AC
  Filled 2014-10-14: qty 3.5

## 2014-10-14 MED ORDER — HEPARIN SODIUM (PORCINE) 1000 UNIT/ML IJ SOLN
INTRAMUSCULAR | Status: AC
  Filled 2014-10-14: qty 3

## 2014-10-14 MED ORDER — DEXMEDETOMIDINE HCL IN NACL 200 MCG/50ML IV SOLN
0.0000 ug/kg/h | INTRAVENOUS | Status: DC
Start: 2014-10-14 — End: 2014-10-15
  Administered 2014-10-14: 0.7 ug/kg/h via INTRAVENOUS
  Filled 2014-10-14: qty 50

## 2014-10-14 MED ORDER — SODIUM CHLORIDE 0.9 % IV SOLN
INTRAVENOUS | Status: DC
  Administered 2014-10-14: 21:00:00 via INTRAVENOUS

## 2014-10-14 MED ORDER — NITROGLYCERIN IN D5W 200-5 MCG/ML-% IV SOLN
2.0000 ug/min | INTRAVENOUS | Status: AC
  Administered 2014-10-14: 5 ug/min via INTRAVENOUS
  Filled 2014-10-14: qty 250

## 2014-10-14 MED ORDER — HEPARIN SODIUM (PORCINE) 1000 UNIT/ML IJ SOLN
INTRAMUSCULAR | Status: DC | PRN
  Administered 2014-10-14: 26000 [IU] via INTRAVENOUS

## 2014-10-14 MED ORDER — MIDAZOLAM HCL 10 MG/2ML IJ SOLN
INTRAMUSCULAR | Status: AC
  Filled 2014-10-14: qty 2

## 2014-10-14 MED ORDER — ACETAMINOPHEN 650 MG RE SUPP
650.0000 mg | Freq: Once | RECTAL | Status: AC
  Administered 2014-10-14: 650 mg via RECTAL

## 2014-10-14 MED ORDER — MAGNESIUM SULFATE 4 GM/100ML IV SOLN
4.0000 g | Freq: Once | INTRAVENOUS | Status: AC
  Administered 2014-10-14: 4 g via INTRAVENOUS
  Filled 2014-10-14: qty 100

## 2014-10-14 MED ORDER — DOPAMINE-DEXTROSE 3.2-5 MG/ML-% IV SOLN
0.0000 ug/kg/min | INTRAVENOUS | Status: DC
  Filled 2014-10-14: qty 250

## 2014-10-14 MED ORDER — SODIUM CHLORIDE 0.9 % IJ SOLN
3.0000 mL | Freq: Two times a day (BID) | INTRAMUSCULAR | Status: DC
Start: 2014-10-15 — End: 2014-10-18
  Administered 2014-10-15 – 2014-10-18 (×6): 3 mL via INTRAVENOUS

## 2014-10-14 MED ORDER — CHLORHEXIDINE GLUCONATE CLOTH 2 % EX PADS: 6.0000 | MEDICATED_PAD | Freq: Once | CUTANEOUS | Status: DC

## 2014-10-14 MED ORDER — FAMOTIDINE IN NACL 20-0.9 MG/50ML-% IV SOLN
20.0000 mg | Freq: Two times a day (BID) | INTRAVENOUS | Status: AC
  Administered 2014-10-14 – 2014-10-15 (×2): 20 mg via INTRAVENOUS
  Filled 2014-10-14: qty 50

## 2014-10-14 MED ORDER — MIDAZOLAM HCL 2 MG/2ML IJ SOLN: 2.0000 mg | INTRAMUSCULAR | Status: DC | PRN

## 2014-10-14 MED ORDER — SIMVASTATIN 40 MG PO TABS
40.0000 mg | ORAL_TABLET | Freq: Every day | ORAL | Status: DC
  Administered 2014-10-15 – 2014-10-20 (×6): 40 mg via ORAL
  Filled 2014-10-14 (×9): qty 1

## 2014-10-14 MED ORDER — ACETAMINOPHEN 500 MG PO TABS
1000.0000 mg | ORAL_TABLET | Freq: Four times a day (QID) | ORAL | Status: DC
  Administered 2014-10-15 – 2014-10-18 (×12): 1000 mg via ORAL
  Filled 2014-10-14 (×17): qty 2

## 2014-10-14 MED ORDER — INSULIN REGULAR BOLUS VIA INFUSION
0.0000 [IU] | Freq: Three times a day (TID) | INTRAVENOUS | Status: DC
  Administered 2014-10-15: 2 [IU] via INTRAVENOUS
  Filled 2014-10-14: qty 10

## 2014-10-14 MED ORDER — SODIUM CHLORIDE 0.9 % IJ SOLN
OROMUCOSAL | Status: DC | PRN
  Administered 2014-10-14 (×3): via TOPICAL

## 2014-10-14 MED ORDER — PANTOPRAZOLE SODIUM 40 MG PO TBEC
40.0000 mg | DELAYED_RELEASE_TABLET | Freq: Every day | ORAL | Status: DC
  Administered 2014-10-16 – 2014-10-18 (×3): 40 mg via ORAL
  Filled 2014-10-14 (×3): qty 1

## 2014-10-14 MED ORDER — LACTATED RINGERS IV SOLN: 500.0000 mL | Freq: Once | INTRAVENOUS | Status: AC | PRN

## 2014-10-14 MED ORDER — SODIUM CHLORIDE 0.9 % IJ SOLN: 3.0000 mL | INTRAMUSCULAR | Status: DC | PRN

## 2014-10-14 MED ORDER — PROPOFOL 10 MG/ML IV BOLUS
INTRAVENOUS | Status: AC
  Filled 2014-10-14: qty 20

## 2014-10-14 MED ORDER — MAGNESIUM SULFATE 50 % IJ SOLN
40.0000 meq | INTRAMUSCULAR | Status: DC
  Filled 2014-10-14: qty 10

## 2014-10-14 MED ORDER — 0.9 % SODIUM CHLORIDE (POUR BTL) OPTIME
TOPICAL | Status: DC | PRN
  Administered 2014-10-14: 6000 mL

## 2014-10-14 MED ORDER — OXYCODONE HCL 5 MG PO TABS: 5.0000 mg | ORAL_TABLET | ORAL | Status: DC | PRN

## 2014-10-14 MED ORDER — PROTAMINE SULFATE 10 MG/ML IV SOLN
INTRAVENOUS | Status: AC
  Filled 2014-10-14: qty 50

## 2014-10-14 MED ORDER — AMINOCAPROIC ACID 250 MG/ML IV SOLN
10.0000 g | INTRAVENOUS | Status: DC | PRN
  Administered 2014-10-14: 5 g/h via INTRAVENOUS

## 2014-10-14 MED ORDER — MORPHINE SULFATE 2 MG/ML IJ SOLN: 2.0000 mg | INTRAMUSCULAR | Status: DC | PRN

## 2014-10-14 MED ORDER — LIDOCAINE HCL (CARDIAC) 20 MG/ML IV SOLN
INTRAVENOUS | Status: DC | PRN
  Administered 2014-10-14: 80 mg via INTRAVENOUS

## 2014-10-14 MED ORDER — SODIUM CHLORIDE 0.9 % IV SOLN: 250.0000 mL | INTRAVENOUS | Status: DC

## 2014-10-14 MED ORDER — SODIUM CHLORIDE 0.9 % IV SOLN: 250.0000 mL | INTRAVENOUS | Status: DC | PRN

## 2014-10-14 MED ORDER — LACTATED RINGERS IV SOLN
INTRAVENOUS | Status: DC
  Administered 2014-10-14: 21:00:00 via INTRAVENOUS

## 2014-10-14 MED ORDER — LACTATED RINGERS IV SOLN: INTRAVENOUS | Status: DC

## 2014-10-14 MED ORDER — PROTAMINE SULFATE 10 MG/ML IV SOLN
INTRAVENOUS | Status: DC | PRN
  Administered 2014-10-14: 250 mg via INTRAVENOUS

## 2014-10-14 MED ORDER — SODIUM CHLORIDE 0.9 % IV SOLN
INTRAVENOUS | Status: DC
  Filled 2014-10-14: qty 40

## 2014-10-14 MED ORDER — THROMBIN 20000 UNITS EX SOLR
CUTANEOUS | Status: DC | PRN
  Administered 2014-10-14: 20000 [IU] via TOPICAL

## 2014-10-14 MED ORDER — MIDAZOLAM HCL 2 MG/2ML IJ SOLN
INTRAMUSCULAR | Status: DC | PRN
  Administered 2014-10-14: 1 mg via INTRAVENOUS

## 2014-10-14 MED ORDER — VANCOMYCIN HCL 10 G IV SOLR
1500.0000 mg | INTRAVENOUS | Status: AC
  Administered 2014-10-14: 1500 mg via INTRAVENOUS
  Filled 2014-10-14: qty 1500

## 2014-10-14 MED ORDER — CEFUROXIME SODIUM 1.5 G IJ SOLR
1.5000 g | INTRAMUSCULAR | Status: AC
  Administered 2014-10-14: .75 g via INTRAVENOUS
  Administered 2014-10-14: 1.5 g via INTRAVENOUS
  Filled 2014-10-14: qty 1.5

## 2014-10-14 MED ORDER — DEXTROSE 5 % IV SOLN
1.5000 g | Freq: Two times a day (BID) | INTRAVENOUS | Status: AC
  Administered 2014-10-14 – 2014-10-16 (×4): 1.5 g via INTRAVENOUS
  Filled 2014-10-14 (×4): qty 1.5

## 2014-10-14 MED ORDER — HEPARIN SODIUM (PORCINE) 1000 UNIT/ML IJ SOLN
INTRAMUSCULAR | Status: DC
  Filled 2014-10-14: qty 30

## 2014-10-14 MED ORDER — SODIUM CHLORIDE 0.45 % IV SOLN
INTRAVENOUS | Status: DC | PRN
  Administered 2014-10-14: 21:00:00 via INTRAVENOUS

## 2014-10-14 MED ORDER — ONDANSETRON HCL 4 MG/2ML IJ SOLN
4.0000 mg | Freq: Four times a day (QID) | INTRAMUSCULAR | Status: DC | PRN
  Administered 2014-10-15 – 2014-10-17 (×3): 4 mg via INTRAVENOUS
  Filled 2014-10-14 (×3): qty 2

## 2014-10-14 MED ORDER — METOPROLOL TARTRATE 12.5 MG HALF TABLET
12.5000 mg | ORAL_TABLET | Freq: Two times a day (BID) | ORAL | Status: DC
  Filled 2014-10-14 (×3): qty 1

## 2014-10-14 MED ORDER — PROPOFOL 10 MG/ML IV BOLUS
INTRAVENOUS | Status: DC | PRN
  Administered 2014-10-14: 120 mg via INTRAVENOUS

## 2014-10-14 MED ORDER — THROMBIN 20000 UNITS EX SOLR
CUTANEOUS | Status: AC
  Filled 2014-10-14: qty 20000

## 2014-10-14 MED ORDER — METOPROLOL TARTRATE 12.5 MG HALF TABLET
12.5000 mg | ORAL_TABLET | Freq: Once | ORAL | Status: AC
  Administered 2014-10-14: 12.5 mg via ORAL
  Filled 2014-10-14: qty 1

## 2014-10-14 MED ORDER — POTASSIUM CHLORIDE 2 MEQ/ML IV SOLN
80.0000 meq | INTRAVENOUS | Status: DC
  Filled 2014-10-14: qty 40

## 2014-10-14 MED ORDER — SODIUM CHLORIDE 0.9 % IV SOLN: INTRAVENOUS | Status: DC

## 2014-10-14 MED ORDER — PHENYLEPHRINE HCL 10 MG/ML IJ SOLN
0.0000 ug/min | INTRAVENOUS | Status: DC
  Administered 2014-10-15: 30 ug/min via INTRAVENOUS
  Administered 2014-10-16: 25 ug/min via INTRAVENOUS
  Filled 2014-10-14 (×4): qty 2

## 2014-10-14 MED ORDER — POTASSIUM CHLORIDE 10 MEQ/50ML IV SOLN
10.0000 meq | INTRAVENOUS | Status: AC
  Administered 2014-10-14 – 2014-10-15 (×3): 10 meq via INTRAVENOUS

## 2014-10-14 MED ORDER — PHENYLEPHRINE HCL 10 MG/ML IJ SOLN
INTRAMUSCULAR | Status: DC | PRN
  Administered 2014-10-14: 20 ug via INTRAVENOUS
  Administered 2014-10-14: 40 ug via INTRAVENOUS

## 2014-10-14 MED ORDER — BISACODYL 5 MG PO TBEC
10.0000 mg | DELAYED_RELEASE_TABLET | Freq: Every day | ORAL | Status: DC
  Administered 2014-10-15 – 2014-10-18 (×4): 10 mg via ORAL
  Filled 2014-10-14 (×4): qty 2

## 2014-10-14 MED ORDER — OXYBUTYNIN CHLORIDE 5 MG PO TABS
5.0000 mg | ORAL_TABLET | Freq: Three times a day (TID) | ORAL | Status: DC
  Administered 2014-10-15 – 2014-10-21 (×19): 5 mg via ORAL
  Filled 2014-10-14 (×23): qty 1

## 2014-10-14 MED ORDER — LACTATED RINGERS IV SOLN
INTRAVENOUS | Status: DC | PRN
  Administered 2014-10-14: 15:00:00 via INTRAVENOUS

## 2014-10-14 MED ORDER — PROTAMINE SULFATE 10 MG/ML IV SOLN
INTRAVENOUS | Status: AC
  Filled 2014-10-14: qty 10

## 2014-10-14 SURGICAL SUPPLY — 109 items
BAG DECANTER FOR FLEXI CONT (MISCELLANEOUS) ×3 IMPLANT
BANDAGE ELASTIC 4 VELCRO ST LF (GAUZE/BANDAGES/DRESSINGS) ×3 IMPLANT
BANDAGE ELASTIC 6 VELCRO ST LF (GAUZE/BANDAGES/DRESSINGS) ×3 IMPLANT
BASKET HEART  (ORDER IN 25'S) (MISCELLANEOUS) ×1
BASKET HEART (ORDER IN 25'S) (MISCELLANEOUS) ×1
BASKET HEART (ORDER IN 25S) (MISCELLANEOUS) ×1 IMPLANT
BENZOIN TINCTURE PRP APPL 2/3 (GAUZE/BANDAGES/DRESSINGS) ×3 IMPLANT
BLADE STERNUM SYSTEM 6 (BLADE) ×3 IMPLANT
BLADE SURG 11 STRL SS (BLADE) ×3 IMPLANT
BLADE SURG 15 STRL LF DISP TIS (BLADE) ×1 IMPLANT
BLADE SURG 15 STRL SS (BLADE) ×2
BNDG GAUZE ELAST 4 BULKY (GAUZE/BANDAGES/DRESSINGS) ×3 IMPLANT
CANISTER SUCTION 2500CC (MISCELLANEOUS) ×3 IMPLANT
CATH ROBINSON RED A/P 18FR (CATHETERS) ×6 IMPLANT
CATH THORACIC 28FR (CATHETERS) ×3 IMPLANT
CATH THORACIC 36FR (CATHETERS) ×3 IMPLANT
CATH THORACIC 36FR RT ANG (CATHETERS) ×3 IMPLANT
CLIP TI MEDIUM 24 (CLIP) IMPLANT
CLIP TI WIDE RED SMALL 24 (CLIP) ×3 IMPLANT
CLOSURE WOUND 1/2 X4 (GAUZE/BANDAGES/DRESSINGS) ×1
COVER SURGICAL LIGHT HANDLE (MISCELLANEOUS) ×3 IMPLANT
CRADLE DONUT ADULT HEAD (MISCELLANEOUS) ×3 IMPLANT
DRAPE CARDIOVASCULAR INCISE (DRAPES) ×2
DRAPE INCISE IOBAN 66X45 STRL (DRAPES) ×3 IMPLANT
DRAPE PROXIMA HALF (DRAPES) ×3 IMPLANT
DRAPE SLUSH/WARMER DISC (DRAPES) ×3 IMPLANT
DRAPE SRG 135X102X78XABS (DRAPES) ×1 IMPLANT
DRSG COVADERM 4X14 (GAUZE/BANDAGES/DRESSINGS) ×3 IMPLANT
ELECT CAUTERY BLADE 6.4 (BLADE) ×3 IMPLANT
ELECT REM PT RETURN 9FT ADLT (ELECTROSURGICAL) ×6
ELECTRODE REM PT RTRN 9FT ADLT (ELECTROSURGICAL) ×2 IMPLANT
GAUZE SPONGE 4X4 12PLY STRL (GAUZE/BANDAGES/DRESSINGS) ×6 IMPLANT
GLOVE BIO SURGEON STRL SZ 6 (GLOVE) ×15 IMPLANT
GLOVE BIO SURGEON STRL SZ 6.5 (GLOVE) IMPLANT
GLOVE BIO SURGEON STRL SZ7 (GLOVE) IMPLANT
GLOVE BIO SURGEON STRL SZ7.5 (GLOVE) IMPLANT
GLOVE BIO SURGEONS STRL SZ 6.5 (GLOVE)
GLOVE BIOGEL PI IND STRL 6 (GLOVE) ×2 IMPLANT
GLOVE BIOGEL PI IND STRL 6.5 (GLOVE) IMPLANT
GLOVE BIOGEL PI IND STRL 7.0 (GLOVE) ×4 IMPLANT
GLOVE BIOGEL PI INDICATOR 6 (GLOVE) ×4
GLOVE BIOGEL PI INDICATOR 6.5 (GLOVE)
GLOVE BIOGEL PI INDICATOR 7.0 (GLOVE) ×8
GLOVE EUDERMIC 7 POWDERFREE (GLOVE) ×6 IMPLANT
GLOVE ORTHO TXT STRL SZ7.5 (GLOVE) IMPLANT
GOWN STRL REUS W/ TWL LRG LVL3 (GOWN DISPOSABLE) ×6 IMPLANT
GOWN STRL REUS W/ TWL XL LVL3 (GOWN DISPOSABLE) ×1 IMPLANT
GOWN STRL REUS W/TWL LRG LVL3 (GOWN DISPOSABLE) ×12
GOWN STRL REUS W/TWL XL LVL3 (GOWN DISPOSABLE) ×2
HEMOSTAT POWDER SURGIFOAM 1G (HEMOSTASIS) ×9 IMPLANT
HEMOSTAT SURGICEL 2X14 (HEMOSTASIS) ×3 IMPLANT
INSERT FOGARTY 61MM (MISCELLANEOUS) IMPLANT
INSERT FOGARTY XLG (MISCELLANEOUS) IMPLANT
KIT BASIN OR (CUSTOM PROCEDURE TRAY) ×3 IMPLANT
KIT CATH CPB BARTLE (MISCELLANEOUS) ×3 IMPLANT
KIT ROOM TURNOVER OR (KITS) ×3 IMPLANT
KIT SUCTION CATH 14FR (SUCTIONS) ×3 IMPLANT
KIT VASOVIEW W/TROCAR VH 2000 (KITS) ×3 IMPLANT
NEEDLE 22X1 1/2 (OR ONLY) (NEEDLE) ×3 IMPLANT
NS IRRIG 1000ML POUR BTL (IV SOLUTION) ×15 IMPLANT
PACK OPEN HEART (CUSTOM PROCEDURE TRAY) ×3 IMPLANT
PACK TRANSFER 300ML (TOM) (MISCELLANEOUS) ×6 IMPLANT
PAD ARMBOARD 7.5X6 YLW CONV (MISCELLANEOUS) ×6 IMPLANT
PAD ELECT DEFIB RADIOL ZOLL (MISCELLANEOUS) ×3 IMPLANT
PENCIL BUTTON HOLSTER BLD 10FT (ELECTRODE) ×3 IMPLANT
PUNCH AORTIC ROTATE 4.0MM (MISCELLANEOUS) IMPLANT
PUNCH AORTIC ROTATE 4.5MM 8IN (MISCELLANEOUS) ×3 IMPLANT
PUNCH AORTIC ROTATE 5MM 8IN (MISCELLANEOUS) IMPLANT
SET CARDIOPLEGIA MPS 5001102 (MISCELLANEOUS) ×3 IMPLANT
SET Y EXTENSION LINE CSP (IV SETS) ×3 IMPLANT
SPONGE INTESTINAL PEANUT (DISPOSABLE) IMPLANT
SPONGE LAP 18X18 X RAY DECT (DISPOSABLE) IMPLANT
SPONGE LAP 4X18 X RAY DECT (DISPOSABLE) ×3 IMPLANT
STRIP CLOSURE SKIN 1/2X4 (GAUZE/BANDAGES/DRESSINGS) ×2 IMPLANT
SUT BONE WAX W31G (SUTURE) ×3 IMPLANT
SUT MNCRL AB 4-0 PS2 18 (SUTURE) ×3 IMPLANT
SUT PROLENE 3 0 SH DA (SUTURE) IMPLANT
SUT PROLENE 3 0 SH1 36 (SUTURE) ×3 IMPLANT
SUT PROLENE 4 0 RB 1 (SUTURE)
SUT PROLENE 4 0 SH DA (SUTURE) IMPLANT
SUT PROLENE 4-0 RB1 .5 CRCL 36 (SUTURE) IMPLANT
SUT PROLENE 5 0 C 1 36 (SUTURE) IMPLANT
SUT PROLENE 6 0 C 1 30 (SUTURE) ×3 IMPLANT
SUT PROLENE 7 0 BV 1 (SUTURE) ×3 IMPLANT
SUT PROLENE 7 0 BV1 MDA (SUTURE) ×3 IMPLANT
SUT PROLENE 8 0 BV175 6 (SUTURE) IMPLANT
SUT SILK  1 MH (SUTURE)
SUT SILK 1 MH (SUTURE) IMPLANT
SUT STEEL STERNAL CCS#1 18IN (SUTURE) IMPLANT
SUT STEEL SZ 6 DBL 3X14 BALL (SUTURE) IMPLANT
SUT VIC AB 1 CTX 36 (SUTURE) ×8
SUT VIC AB 1 CTX36XBRD ANBCTR (SUTURE) ×4 IMPLANT
SUT VIC AB 2-0 CT1 27 (SUTURE) ×4
SUT VIC AB 2-0 CT1 TAPERPNT 27 (SUTURE) ×2 IMPLANT
SUT VIC AB 2-0 CTX 27 (SUTURE) IMPLANT
SUT VIC AB 3-0 SH 27 (SUTURE)
SUT VIC AB 3-0 SH 27X BRD (SUTURE) IMPLANT
SUT VIC AB 3-0 X1 27 (SUTURE) IMPLANT
SUT VICRYL 4-0 PS2 18IN ABS (SUTURE) IMPLANT
SUTURE E-PAK OPEN HEART (SUTURE) ×3 IMPLANT
SYR 20CC LL (SYRINGE) ×3 IMPLANT
SYSTEM SAHARA CHEST DRAIN ATS (WOUND CARE) ×3 IMPLANT
TAPE CLOTH SURG 4X10 WHT LF (GAUZE/BANDAGES/DRESSINGS) ×3 IMPLANT
TOWEL OR 17X24 6PK STRL BLUE (TOWEL DISPOSABLE) ×3 IMPLANT
TOWEL OR 17X26 10 PK STRL BLUE (TOWEL DISPOSABLE) ×3 IMPLANT
TRAY FOLEY IC TEMP SENS 16FR (CATHETERS) ×3 IMPLANT
TUBING INSUFFLATION (TUBING) ×3 IMPLANT
UNDERPAD 30X30 INCONTINENT (UNDERPADS AND DIAPERS) ×3 IMPLANT
WATER STERILE IRR 1000ML POUR (IV SOLUTION) ×6 IMPLANT

## 2014-10-14 SURGICAL SUPPLY — 10 items
CATH INFINITI 5 FR JL3.5 (CATHETERS) ×3 IMPLANT
CATH INFINITI 5FR ANG PIGTAIL (CATHETERS) ×3 IMPLANT
CATH INFINITI JR4 5F (CATHETERS) ×3 IMPLANT
CATH OPTITORQUE JACKY 4.0 5F (CATHETERS) ×3 IMPLANT
DEVICE RAD TR BAND REGULAR (VASCULAR PRODUCTS) ×3 IMPLANT
GLIDESHEATH SLEND SS 6F .021 (SHEATH) ×3 IMPLANT
GUIDEWIRE VERSACORE 175CM (WIRE) ×3 IMPLANT
KIT MANI 3VAL PERCEP (MISCELLANEOUS) ×3 IMPLANT
PACK CARDIAC CATH (CUSTOM PROCEDURE TRAY) ×3 IMPLANT
WIRE SAFE-T 1.5MM-J .035X260CM (WIRE) ×3 IMPLANT

## 2014-10-14 NOTE — Anesthesia Procedure Notes (Addendum)
Procedure Name: Intubation Date/Time: 10/14/2014 3:34 PM Performed by: Ollen Bowl Pre-anesthesia Checklist: Patient identified, Emergency Drugs available, Suction available, Patient being monitored and Timeout performed Patient Re-evaluated:Patient Re-evaluated prior to inductionOxygen Delivery Method: Circle system utilized and Simple face mask Preoxygenation: Pre-oxygenation with 100% oxygen Intubation Type: IV induction Ventilation: Mask ventilation without difficulty Laryngoscope Size: Miller and 2 Grade View: Grade II Tube type: Subglottic suction tube Tube size: 8.0 mm Number of attempts: 1 Airway Equipment and Method: Patient positioned with wedge pillow and Stylet Placement Confirmation: ETT inserted through vocal cords under direct vision,  positive ETCO2 and breath sounds checked- equal and bilateral Secured at: 21 cm Tube secured with: Tape Dental Injury: Teeth and Oropharynx as per pre-operative assessment     Procedures: Left Subclavian  Gordy Councilman Catheter Insertion by Dr. Roberts Gaudy. [id number]: The patient was identified and consent obtained.  TO was performed, and full barrier precautions were used.  The skin was anesthetized with lidocaine-4cc plain with 25g needle.  Once the vein was located with the 22 ga. needle using ultrasound guidance , the wire was inserted into the vein.  The wire location was confirmed with ultrasound.  The tissue was dilated and the 8.5 [place] cordis catheter was carefully inserted. Afterwards Gordy Councilman catheter was inserted. Previous attempt left and right IJ unsuccessful by myself for unable to thread wire. The patient tolerated the procedure well.  CE

## 2014-10-14 NOTE — Brief Op Note (Signed)
10/14/2014  6:29 PM  PATIENT:  Jenna Marshall  81 y.o. female  PRE-OPERATIVE DIAGNOSIS:  LEFT MAIN CAD  POST-OPERATIVE DIAGNOSIS:  LEFT MAIN CAD  PROCEDURE:  CORONARY ARTERY BYPASS GRAFTING x 4 (LIMA-LAD, SVG-Int-OM, SVG-RCA) ENDOSCOPIC RIGHT GREATER SAPHENOUS VEIN HARVEST   SURGEON:  Gaye Pollack, MD  ASSISTANT: Suzzanne Cloud, PA-C  ANESTHESIA:   general  PATIENT CONDITION:  ICU - intubated and hemodynamically stable.  PRE-OPERATIVE WEIGHT: 85.5 kg

## 2014-10-14 NOTE — Progress Notes (Signed)
PT RESTING IN RECOVERY AT THIS TIME, FAMILY MEMBERS AND MINISTER PRESENT, DR. Fletcher Anon IN AND TO SPEAK WITH PT AND FAMILY REGARDING PT. IN NEED OF OPEN HEART SURGERY, TO TRANSFER TO [place] FOR FURTHER WORKUP AND SURGERY. RIGHT RADIAL BAND INTACT, WITH AIR BEING REMOVED, NO BLEEDING NOR HEMATOMA AT THIS TIME. PT WITH VSS, RESP['S EASY

## 2014-10-14 NOTE — Anesthesia Postprocedure Evaluation (Signed)
Anesthesia Post Note  Patient: Jenna Marshall  Procedure(s) Performed: Procedure(s) (LRB): CORONARY ARTERY BYPASS GRAFTING (CABG), ON PUMP, TIMES FOUR, USING LEFT INTERNAL MAMMARY ARTERY, RIGHT GREATER SAPHENOUS VEIN HARVESTED ENDOSCOPICALLY (N/A)  Anesthesia type: General  Patient location: SICU  Post pain: sedated/intubated  Post assessment: Post-op Vital signs reviewed  Last Vitals: BP 91/58 mmHg  Pulse 80  Temp(Src) 35.9 C (Core (Comment))  Resp 12  SpO2 99%  Post vital signs: Reviewed  Level of consciousness: sedated/intubated  Complications: No apparent anesthesia complications

## 2014-10-14 NOTE — Care Management (Signed)
Patient is for transfer to [facility] for CABG consideration.  Completed non emergent transfer form for carelink transport

## 2014-10-14 NOTE — OR Nursing (Signed)
15-20 minute call to SICU charge nurse @ [phone]

## 2014-10-14 NOTE — Progress Notes (Signed)
[place] at [place] NAME: Jenna Marshall    MR#:  [id number]  DATE OF BIRTH:  1925/10/20  SUBJECTIVE:  CHIEF COMPLAINT:   Chief Complaint  Patient presents with  . Chest Pain  Seen prior to her cardiac catheterization today, no chest pain. Myoview abnormal yesterday.  REVIEW OF SYSTEMS:  Review of Systems  Constitutional: Negative for fever and chills.  Respiratory: Negative for cough, shortness of breath and wheezing.   Cardiovascular: Negative for chest pain and palpitations.  Gastrointestinal: Negative for nausea, vomiting, abdominal pain, diarrhea and constipation.  Genitourinary: Negative for dysuria.  Neurological: Negative for dizziness, seizures and headaches.    DRUG ALLERGIES:   Allergies  Allergen Reactions  . Codeine Nausea And Vomiting    VITALS:  Blood pressure 139/59, pulse 64, temperature 98.4 F (36.9 C), temperature source Oral, resp. rate 18, height 5\' 4"  (1.626 m), weight 85.503 kg (188 lb 8 oz), SpO2 95 %.  PHYSICAL EXAMINATION:  Physical Exam  GENERAL:  82 y.o.-year-old patient lying in the bed with no acute distress.  EYES: Pupils equal, round, reactive to light and accommodation. No scleral icterus. Extraocular muscles intact.  HEENT: Head atraumatic, normocephalic. Oropharynx and nasopharynx clear.  NECK:  Supple, no jugular venous distention. No thyroid enlargement, no tenderness.  LUNGS: Normal breath sounds bilaterally, no wheezing, rales,rhonchi or crepitation. No use of accessory muscles of respiration.  CARDIOVASCULAR: S1, S2 normal. 3/6 systolic murmur present, no rubs, or gallops.  ABDOMEN: Soft, nontender, nondistended. Bowel sounds present. No organomegaly or mass.  EXTREMITIES: No pedal edema, cyanosis, or clubbing.  NEUROLOGIC: Cranial nerves II through XII are intact. Muscle strength 5/5 in all extremities. Sensation intact. Gait not checked.  PSYCHIATRIC: The patient is alert and  oriented x 3.  SKIN: No obvious rash, lesion, or ulcer.    LABORATORY PANEL:   CBC  Recent Labs Lab 10/11/14 1729  WBC 7.8  HGB 12.5  HCT 37.7  PLT 145*   ------------------------------------------------------------------------------------------------------------------  Chemistries   Recent Labs Lab 10/11/14 1729  NA 138  K 4.5  CL 104  CO2 27  GLUCOSE 91  BUN 33*  CREATININE 0.92  CALCIUM 9.1   ------------------------------------------------------------------------------------------------------------------  Cardiac Enzymes  Recent Labs Lab 10/12/14 1116  TROPONINI 0.03   ------------------------------------------------------------------------------------------------------------------  RADIOLOGY:  Nm Myocar Multi W/spect W/wall Motion / Ef  10/13/2014    Pharmacological myocardial perfusion imaging study with moderate sized,  moderate in severity ischemia noted in the mid to apical anterior wall and  mid to apical lateral and inferolateral wall.  The left ventricular ejection fraction is moderately decreased (30-44%).  EF estimated at 41%  Defect 1: There is a medium defect of moderate severity present in the  basal inferolateral, mid anterior, mid inferolateral, mid anterolateral,  apical anterior and apex location.  T wave inversion was noted during rest and stress in the inferior leads  (aVF, II and III). No EKG changes concerning for ischemia.  This is a high risk study.     EKG:   Orders placed or performed during the hospital encounter of 10/11/14  . ED EKG (<11mins upon arrival to the ED)  . ED EKG (<78mins upon arrival to the ED)  . EKG 12-Lead  . EKG 12-Lead    ASSESSMENT AND PLAN:   81 year old female with past medical history significant for hypertension and gastroesophageal reflux disease with hiatal hernia presents to the hospital secondary to chest pain.  #1 unstable angina-troponins  negative on admission. Myoview done yesterday was  normal. For cardiac catheterization today. Cardiac cath done this morning revealed critical left main stenosis and significant distal RCA stenosis with heavily calcified arteries. Ejection fraction is normal by echocardiogram. Patient will be transferred to [facility] for possible bypass graft surgery today. On aspirin and continue nitroglycerin at this time. Also started on statin here.  #2 hypertension-on Norvasc and losartan.  #3 hypothyroidism-on Synthroid  #4 gastroesophageal reflux disease-on Protonix. Also has history of hiatal hernia.  #5 DVT prophylaxis-subcutaneous heparin.   All the records are reviewed and case discussed with Care Management/Social Workerr. Management plans discussed with the patient, family and they are in agreement.  CODE STATUS: Full code  TOTAL TIME TAKING CARE OF THIS PATIENT: 38 minutes.    Gladstone Lighter M.D on 10/14/2014 at 10:33 AM  Between 7am to 6pm - Pager - [phone]  After 6pm go to [email or web address] - password [email or web address] [place] [facility]  Office  [phone]  CC: Primary care physician; [name], MD   Addendum-patient's Myoview is positive. Cardiology consult requested. Echocardiogram is pending. Likely proceed with cardiac catheterization tomorrow morning.

## 2014-10-14 NOTE — Anesthesia Preprocedure Evaluation (Addendum)
Anesthesia Evaluation  Patient identified by MRN, date of birth, ID band Patient awake  General Assessment Comment:Emergency surgery. CE  Reviewed: Allergy & Precautions, NPO status , Patient's Chart, lab work & pertinent test results  Airway Mallampati: II  TM Distance: >3 FB Neck ROM: Full    Dental   Pulmonary neg pulmonary ROS,  breath sounds clear to auscultation        Cardiovascular hypertension, + angina + CAD Rhythm:Regular Rate:Normal     Neuro/Psych    GI/Hepatic Neg liver ROS, GERD-  ,  Endo/Other  Hypothyroidism   Renal/GU negative Renal ROS     Musculoskeletal   Abdominal   Peds  Hematology   Anesthesia Other Findings   Reproductive/Obstetrics                           Anesthesia Physical Anesthesia Plan  ASA: III  Anesthesia Plan: General   Post-op Pain Management:    Induction: Intravenous  Airway Management Planned: Oral ETT  Additional Equipment: PA Cath, Arterial line, Ultrasound Guidance Line Placement and TEE  Intra-op Plan:   Post-operative Plan:   Informed Consent: I have reviewed the patients History and Physical, chart, labs and discussed the procedure including the risks, benefits and alternatives for the proposed anesthesia with the patient or authorized representative who has indicated his/her understanding and acceptance.   Dental advisory given  Plan Discussed with: CRNA and Anesthesiologist  Anesthesia Plan Comments:         Anesthesia Quick Evaluation

## 2014-10-14 NOTE — Progress Notes (Signed)
Pt transferred to [facility] for CABG from specials unit. Pt belongings given to family to take home. Report given to [place] from [employer].

## 2014-10-14 NOTE — H&P (Signed)
Jenna Marshall       Jenna Marshall,Jenna Marshall             [phone]      Cardiothoracic Surgery History and Physical   Jenna Marshall is an 81 y.o. female.   Chief Complaint: High grade left main coronary stenosis HPI:   The patient is an 81 year old woman with HTN and HLD who was admitted to [facility] on Tuesday with a several week history of intermittent SSCP and aching in the forearm muscles bilaterally that was occuring with exertion and at rest. She ruled out for MI, had a high risk nuclear stress test and catheterization this am showing 99% distal LM stenosis and significant RCA stenosis. An echo showed an EF of 55-60%. Nuclear stress showed an EF of 41%. She says she has had no CP since admission Tuesday.   Her past history is significant for bilateral lower extremity vein stripping many years ago.  Past Medical History  Diagnosis Date  . Hypertension   . Hyperlipemia   . CAD (coronary artery disease)     a. Lexiscan 10/13/14: mid anterior to apical & inf wall ischemia w/ WMA, mild to mod dep EF; b. cardiac cath 10/14/2014: ost LM to LM 50%, LM 99%, ost LAD 95%, ost LAD 95%, prox LAD to mid LAD 40%, prox RCA 30%, mid RCA 70%, Critical left main stenosis. Significant distal RCA stenosis. Heavily calcified arteries. Normal EF by echo with no significant AS or MR. Recommend urgent CABG.  . GERD (gastroesophageal reflux disease)   . Hypothyroidism   . Diastolic dysfunction     a. echo 10/2014: EF 55-60%, no RWMA, GR1DD, mild AI, trivial MR, mildly dilated LA, PASP normal    No past surgical history on file.  Family History  Problem Relation Age of Onset  . Coronary artery disease Mother   . Coronary artery disease Brother    Social History:  reports that she has never smoked. She does not have any smokeless tobacco history on file. She reports that she does not drink alcohol. Her drug history is not on file. She lives alone at home and still drives  some.  Allergies:  Allergies  Allergen Reactions  . Codeine Nausea And Vomiting    Medications Prior to Admission  Medication Sig Dispense Refill  . amLODipine (NORVASC) 5 MG tablet Take 1 tablet (5 mg total) by mouth daily. 30 tablet 1  . Calcium Carb-Cholecalciferol 600-200 MG-UNIT TABS Take 1 tablet by mouth daily.    . cyanocobalamin 1000 MCG tablet Take 100 mcg by mouth daily.    . diclofenac (VOLTAREN) 50 MG EC tablet Take 50 mg by mouth 2 (two) times daily.    Marland Kitchen levothyroxine (SYNTHROID, LEVOTHROID) 25 MCG tablet Take 25 mcg by mouth daily before breakfast.    . losartan (COZAAR) 100 MG tablet Take 100 mg by mouth daily.    . Multiple Vitamin (MULTIVITAMIN WITH MINERALS) TABS tablet Take 1 tablet by mouth daily.    Marland Kitchen oxybutynin (DITROPAN) 5 MG tablet Take 5 mg by mouth 3 (three) times daily.    . pantoprazole (PROTONIX) 40 MG tablet Take 40 mg by mouth daily.    . simvastatin (ZOCOR) 40 MG tablet Take 40 mg by mouth at bedtime.      No results found for this or any previous visit (from the past 48 hour(s)). Nm Myocar Multi W/spect W/wall Motion / Ef  10/13/2014  Pharmacological myocardial perfusion imaging study with moderate sized,  moderate in severity ischemia noted in the mid to apical anterior wall and  mid to apical lateral and inferolateral wall.  The left ventricular ejection fraction is moderately decreased (30-44%).  EF estimated at 41%  Defect 1: There is a medium defect of moderate severity present in the  basal inferolateral, mid anterior, mid inferolateral, mid anterolateral,  apical anterior and apex location.  T wave inversion was noted during rest and stress in the inferior leads  (aVF, II and III). No EKG changes concerning for ischemia.  This is a high risk study.     Review of Systems  Constitutional: Negative for fever, chills and malaise/fatigue.  HENT: Negative.   Eyes: Negative.   Respiratory: Negative for cough and shortness of breath.    Cardiovascular: Positive for chest pain. Negative for palpitations, orthopnea, claudication, leg swelling and PND.  Gastrointestinal: Negative.   Genitourinary:       Some urinary incontinence and wears Depends  Musculoskeletal: Negative.   Skin: Negative.   Neurological: Negative.   Endo/Heme/Allergies: Negative.   Psychiatric/Behavioral: Negative.     Blood pressure 159/57, pulse 63, temperature 97.3 F (36.3 C), temperature source Oral, resp. rate 14, SpO2 98 %. Physical Exam  Constitutional: She appears well-developed and well-nourished. No distress.  Looks good for her age  HENT:  Head: Normocephalic and atraumatic.  Mouth/Throat: Oropharynx is clear and moist.  Eyes: EOM are normal. Pupils are equal, round, and reactive to light.  Neck: Normal range of motion. Neck supple. No JVD present. No thyromegaly present.  Cardiovascular: Normal rate, regular rhythm, normal heart sounds and intact distal pulses.   No murmur heard. Respiratory: Effort normal and breath sounds normal. No respiratory distress.  GI: Soft. Bowel sounds are normal. She exhibits no distension and no mass. There is no tenderness.  Musculoskeletal:  She has numerous small incisions in the left leg from the ankle to the groin from vein stripping. She has several small incisions in the right lower leg but none in the thigh.  Lymphadenopathy:    She has no cervical adenopathy.  Skin: Skin is warm and dry.  Psychiatric: She has a normal mood and affect.         [facility]*           [place], [place] [address]              [phone]  ------------------------------------------------------------------- Transthoracic Echocardiography  Patient:  Jenna Marshall, Jenna Marshall MR #:    [id number] Study Date: 10/14/2014 Gender:   F Age:    41 Height:   162.6 cm Weight:   85.3 kg BSA:    1.99 m^2 Pt.  Status: Room:  SONOGRAPHER [facility] ATTENDING  [facility] Ronaldo Miyamoto, Radhika PERFORMING  [facility], [facility]  cc:  ------------------------------------------------------------------- LV EF: 55% -  60%  ------------------------------------------------------------------- Indications:   Chest pain (chest pain).  ------------------------------------------------------------------- Study Conclusions  - Left ventricle: The cavity size was normal. There was moderate concentric hypertrophy. Systolic function was normal. The estimated ejection fraction was in the range of 55% to 60%. Wall motion was normal; there were no regional wall motion abnormalities. Doppler parameters are consistent with abnormal left ventricular relaxation (grade 1 diastolic dysfunction). - Aortic valve: There was mild regurgitation. Valve area (Vmax): 2.99 cm^2. - Mitral valve: There was trivial regurgitation. - Left atrium:  The atrium was mildly dilated. - Pulmonary arteries: Systolic pressure was within the normal range.  Transthoracic echocardiography. M-mode, complete 2D, spectral Doppler, and color Doppler. Birthdate: Patient birthdate: Jan 15, 1926. Age: Patient is 81 yr old. Sex: Gender: female. BMI: 32.3 kg/m^2. Patient status: Inpatient. Study date: Study date: 10/14/2014. Study time: 07:37 AM.  -------------------------------------------------------------------  ------------------------------------------------------------------- Left ventricle: The cavity size was normal. There was moderate concentric hypertrophy. Systolic function was normal. The estimated ejection fraction was in the range of 55% to 60%. Wall motion was normal; there were no regional wall motion abnormalities. Doppler parameters are consistent with abnormal left ventricular relaxation (grade 1 diastolic  dysfunction).  ------------------------------------------------------------------- Aortic valve:  Trileaflet; normal thickness, mildly calcified leaflets. Mobility was not restricted. Doppler: Transvalvular velocity was within the normal range. There was no stenosis. There was mild regurgitation.  Peak velocity ratio of LVOT to aortic valve: 0.86. Valve area (Vmax): 2.99 cm^2. Indexed valve area (Vmax): 1.5 cm^2/m^2.  Peak gradient (S): 6 mm Hg.  ------------------------------------------------------------------- Aorta: Aortic root: The aortic root was normal in size.  ------------------------------------------------------------------- Mitral valve:  Structurally normal valve.  Mobility was not restricted. Doppler: Transvalvular velocity was within the normal range. There was no evidence for stenosis. There was trivial regurgitation.  Peak gradient (D): 3 mm Hg.  ------------------------------------------------------------------- Left atrium: The atrium was mildly dilated.  ------------------------------------------------------------------- Right ventricle: The cavity size was normal. Wall thickness was normal. Systolic function was normal.  ------------------------------------------------------------------- Pulmonic valve:  Doppler: Transvalvular velocity was within the normal range. There was no evidence for stenosis. There was trivial regurgitation.  ------------------------------------------------------------------- Tricuspid valve:  Structurally normal valve.  Doppler: Transvalvular velocity was within the normal range. There was trivial regurgitation.  ------------------------------------------------------------------- Pulmonary artery:  The main pulmonary artery was normal-sized. Systolic pressure was within the normal range.  ------------------------------------------------------------------- Right atrium: The atrium was normal in  size.  ------------------------------------------------------------------- Pericardium: There was no pericardial effusion.  ------------------------------------------------------------------- Systemic veins: Inferior vena cava: Not visualized.  ------------------------------------------------------------------- Measurements  Left ventricle              Value     Reference LV ID, ED, PLAX chordal          45.8 mm    43 - 52 LV ID, ES, PLAX chordal          29.5 mm    23 - 38 LV fx shortening, PLAX chordal      36  %    >=29 LV PW thickness, ED            11.5 mm    --------- IVS/LV PW ratio, ED        (H)   1.43      <=1.3 LV e&', lateral              3.02 cm/s   --------- LV E/e&', lateral             28.77     --------- LV e&', medial               2.83 cm/s   --------- LV E/e&', medial              30.71     --------- LV e&', average              2.93 cm/s   --------- LV E/e&', average             29.71     ---------  Ventricular septum  Value     Reference IVS thickness, ED             16.4 mm    ---------  LVOT                   Value     Reference LVOT ID, S                21  mm    --------- LVOT area                 3.46 cm^2   --------- LVOT peak velocity, S           107  cm/s   ---------  Aortic valve               Value     Reference Aortic valve peak velocity, S       124  cm/s   --------- Aortic peak gradient, S          6   mm Hg  --------- Velocity ratio, peak, LVOT/AV       0.86      --------- Aortic valve area, peak velocity     2.99 cm^2   --------- Aortic valve  area/bsa, peak        1.5  cm^2/m^2 --------- velocity  Aorta                   Value     Reference Aortic root ID, ED            27  mm    ---------  Left atrium                Value     Reference LA ID, A-P, ES              48  mm    --------- LA ID/bsa, A-P          (H)   2.41 cm/m^2  <=2.2 LA volume, S               68  ml    --------- LA volume/bsa, S             34.1 ml/m^2  --------- LA volume, ES, 1-p A4C          68  ml    --------- LA volume/bsa, ES, 1-p A4C        34.1 ml/m^2  --------- LA volume, ES, 1-p A2C          63  ml    --------- LA volume/bsa, ES, 1-p A2C        31.6 ml/m^2  ---------  Mitral valve               Value     Reference Mitral E-wave peak velocity        86.9 cm/s   --------- Mitral A-wave peak velocity        112  cm/s   --------- Mitral deceleration time     (H)   306  ms    150 - 230 Mitral peak gradient, D          3   mm Hg  --------- Mitral E/A ratio, peak          0.8      ---------  Right ventricle              Value     Reference RV ID,  ED, PLAX              31.7 mm    19 - 38  Pulmonic valve              Value     Reference Pulmonic valve peak velocity, S      96.4 cm/s   ---------  Legend: (L) and (H) mark values outside specified reference range.  ------------------------------------------------------------------- Prepared and Electronically Authenticated by  [name], MD 2016-05-13T09:57:33   Prox RCA lesion, 30% stenosed.  Mid RCA lesion, 70% stenosed.  Ost LM to LM lesion, 50% stenosed.  LM lesion, 99% stenosed.  Ost LAD lesion, 95% stenosed.  Prox LAD to Mid LAD  lesion, 40% stenosed.  Final Conclusions:  1. Critical left main stenosis. Significant distal RCA stenosis. Heavily calcified arteries.  2. Normal EF by echo with no significant AS or MR  Recommendations:  Urgent CABG.        Coronary Findings    Dominance: Right   Left Main   . Ost LM to LM lesion, 50% stenosed.   . LM lesion, 99% stenosed. severely calcified .     Left Anterior Descending   . Ost LAD lesion, 95% stenosed.   . Prox LAD to Mid LAD lesion, 40% stenosed.   . First Diagonal Branch   There is mild.   . Second Diagonal Branch   The vessel is small in size.   . Third Diagonal Branch   The vessel is small in size.     Ramus Intermedius  The vessel , is large . The vessel exhibits minimal luminal irregularities.     Left Circumflex  There is mildthe vessel.   . First Obtuse Marginal Branch   There is mild.   . Second Obtuse Marginal Branch   There is mild.     Right Coronary Artery   . Prox RCA lesion, 30% stenosed. severely calcified .   Marland Kitchen Mid RCA lesion, 70% stenosed. calcified .   Marland Kitchen Right Posterior Descending Artery   The vessel exhibits minimal luminal irregularities.   . Right Posterior Atrioventricular Branch   The vessel exhibits minimal luminal irregularities.   . First Right Posterolateral   The vessel exhibits minimal luminal irregularities.   . Second Right Posterolateral   The vessel exhibits minimal luminal irregularities.      Coronary Diagrams    Diagnostic Diagram            Hemo Data    AO Systolic Cath Pressure AO Diastolic Cath Pressure AO Mean Cath Pressure   120 47 mmHg 75 mmHg    Supplies    Name ID Temporary Type Charge Code Description Charge Code Quantity   GLIDESHEATH SLEND SS 27F .021 26378 No SHEATH HC SUPPLY SHEATH INTRODUCER 156076 1   KIT MANI 3VAL PERCEP 61335 No MISCELLANEOUS HC SUPPLY STERILE 544287 1   GUIDEWIRE VERSACORE 175CM 62157 No WIRE HC SUPPLY GUIDE WIRES 154828 1   CATH  INFINITI JR4 12F 62530 No CATHETERS HC SUPPLY CATHETER DIAGNOSTIC 156060 1   CATH INFINITI 12FR ANG PIGTAIL 58850 No CATHETERS HC SUPPLY CATHETER DIAGNOSTIC 156060 1   CATH INFINITI 5 FR JL3.5 62659 No CATHETERS HC SUPPLY CATHETER DIAGNOSTIC 156060 1   CATH TR BAND REGULAR 27741 No VASCULAR PRODUCTS HC SUPPLY GUIDE WIRES 154828 1   CATH OPTITORQUE JACKY 4.0 12F 62690 No CATHETERS HC SUPPLY CATHETER DIAGNOSTIC 156060 1   WIRE SAFE-T 1.5MM-J .[id number] 28786 No WIRE  [place] CATH 28241 No CUSTOM PROCEDURE TRAY HC SUPPLY STERILE 753010 1    Implants    Name ID Temporary Type Supply   No information to display    Order-Level Documents - 10/11/14:      Scan on 10/13/2014 2:10 PM by Provider Default, MDScan on 10/13/2014 2:10 PM by Provider Default, MD    Encounter-Level Documents - 10/11/14:      Electronic signature on 10/11/2014 8:38 PM     Electronic signature on 10/11/2014 8:37 PM    Signed    Electronically signed by Wellington Hampshire, MD on 10/14/14 at 7 EDT    Assessment/Plan  She has 99% distal LM stenosis and requires urgent CABG. She was transferred from [place] this afternoon. Unfortunately she has had bilateral lower ext vein stripping in the distant past and may not have any usable saphenous vein. If there is no vein then we will need to use both IMA's if they are long enough and could consider using the left radial artery if doppler exam shows that it is not the primary supply to her hand. I discussed the operative procedure with the patient and son, daughter-in-law including alternatives, benefits and risks; including but not limited to bleeding, blood transfusion, infection, stroke, myocardial infarction, graft failure, heart block requiring a permanent pacemaker, organ dysfunction, and death. I also discussed the possible need to use the left radial artery if it is not the primary arterial supply to the left hand.  Jenna Marshall understands and  agrees to proceed.  We will schedule surgery for this afternoon.  Gaye Pollack 10/14/2014, 1:13 PM

## 2014-10-14 NOTE — H&P (View-Only) (Signed)
Cardiology Consultation Note  Patient ID: Jenna Marshall, MRN: [id number], DOB/AGE: [birth year] 81 y.o. Admit date: 10/11/2014   Date of Consult: 10/13/2014 Primary Physician: [name], MD Primary Cardiologist:   Chief Complaint: Chest pain, arm pain Reason for Consult: Positive stress test  HPI: 81 y.o. female with a known history of essential hypertension, hyperlipidemia who  presented with chest pain. She has had intermittent chest pain for several months that has been getting worse . Symptoms significant now with exertion . Radiating to her arms bilaterally . Pain was severe yesterday and she presented to the emergency room. Symptoms presented at rest, retrosternal in location, radiation to left shoulder,  7-8 in intensity no further worsening or relieving factors. She denies any associated symptoms at that time including shortness of breath, diaphoresis, nausea. She is symptom-free since her admission.  She was scheduled for stress testing which was done this morning that showed regions of ischemia and depressed ejection fraction. Case was discussed with hospitalist service and they requested a consult for further evaluation and management.    Past Medical History  Diagnosis Date  . Hypertension   . Shortness of breath dyspnea   . Hyperlipemia       Most Recent Cardiac Studies: Myoview stress test showing mid anterior to apical wall ischemia, inferolateral wall ischemia with mall motion abnormalities, mild to moderately depressed ejection fraction    Surgical History: History reviewed. No pertinent past surgical history.   Home Meds: Prior to Admission medications   Medication Sig Start Date End Date Taking? Authorizing Provider  Calcium Carb-Cholecalciferol 600-200 MG-UNIT TABS Take 1 tablet by mouth daily.   Yes Historical Provider, MD  cyanocobalamin 1000 MCG tablet Take 100 mcg by mouth daily.   Yes Historical Provider, MD  diclofenac (VOLTAREN) 50 MG EC tablet Take 50  mg by mouth 2 (two) times daily.   Yes Historical Provider, MD  levothyroxine (SYNTHROID, LEVOTHROID) 25 MCG tablet Take 25 mcg by mouth daily before breakfast.   Yes Historical Provider, MD  losartan (COZAAR) 100 MG tablet Take 100 mg by mouth daily.   Yes Historical Provider, MD  Multiple Vitamin (MULTIVITAMIN WITH MINERALS) TABS tablet Take 1 tablet by mouth daily.   Yes Historical Provider, MD  oxybutynin (DITROPAN) 5 MG tablet Take 5 mg by mouth 3 (three) times daily.   Yes Historical Provider, MD  pantoprazole (PROTONIX) 40 MG tablet Take 40 mg by mouth daily.   Yes Historical Provider, MD  simvastatin (ZOCOR) 40 MG tablet Take 40 mg by mouth at bedtime.   Yes Historical Provider, MD  amLODipine (NORVASC) 5 MG tablet Take 1 tablet (5 mg total) by mouth daily. 10/13/14   Gladstone Lighter, MD    Inpatient Medications:  . amLODipine  5 mg Oral Daily  . [START ON 10/14/2014] aspirin  81 mg Oral Pre-Cath  . aspirin  325 mg Oral Daily  . calcium-vitamin D  1 tablet Oral Daily  . heparin  5,000 Units Subcutaneous 3 times per day  . levothyroxine  25 mcg Oral QAC breakfast  . losartan  100 mg Oral Daily  . multivitamin with minerals  1 tablet Oral Daily  . oxybutynin  5 mg Oral TID  . pantoprazole  40 mg Oral Daily  . simvastatin  40 mg Oral QHS  . sodium chloride  3 mL Intravenous Q12H  . cyanocobalamin  100 mcg Oral Daily   . [START ON 10/14/2014] sodium chloride     Followed by  . [  START ON 10/14/2014] sodium chloride      Allergies:  Allergies  Allergen Reactions  . Codeine Nausea And Vomiting    History   Social History  . Marital Status: Widowed    Spouse Name: N/A  . Number of Children: N/A  . Years of Education: N/A   Occupational History  . Not on file.   Social History Main Topics  . Smoking status: Never Smoker   . Smokeless tobacco: Not on file  . Alcohol Use: No  . Drug Use: Not on file  . Sexual Activity: Not on file   Other Topics Concern  . Not on  file   Social History Narrative     Family History  Problem Relation Age of Onset  . Coronary artery disease Mother   . Coronary artery disease Brother      Review of Systems General: negative for chills, fever, night sweats or weight changes.  Cardiovascular: Positive for  chest pain,  negative for edema, orthopnea, palpitations, paroxysmal nocturnal dyspnea, shortness of breath or dyspnea on exertion Dermatological: negative for rash Respiratory: negative for cough or wheezing Urologic: negative for hematuria Abdominal: negative for nausea, vomiting, diarrhea, bright red blood per rectum, melena, or hematemesis Neurologic: negative for visual changes, syncope, or dizziness All other systems reviewed and are otherwise negative except as noted above.  Labs:  Recent Labs  10/11/14 1729 10/11/14 2205 10/12/14 0508 10/12/14 1116  TROPONINI 0.04* <0.03 <0.03 0.03   Lab Results  Component Value Date   WBC 7.8 10/11/2014   HGB 12.5 10/11/2014   HCT 37.7 10/11/2014   MCV 96.3 10/11/2014   PLT 145* 10/11/2014    Recent Labs Lab 10/11/14 1729  NA 138  K 4.5  CL 104  CO2 27  BUN 33*  CREATININE 0.92  CALCIUM 9.1  GLUCOSE 91   No results found for: CHOL, HDL, LDLCALC, TRIG No results found for: DDIMER  Radiology/Studies:  Dg Chest 2 View  10/11/2014   CLINICAL DATA:  Chest pain  EXAM: CHEST  2 VIEW  COMPARISON:  None.  FINDINGS: Mild cardiomegaly. There is a gas-filled lower mediastinal mass consistent with moderate hiatal hernia. Normal aortic and hilar contours. There is no edema, consolidation, effusion, or pneumothorax. Exaggerated thoracic kyphosis without compression fracture.  IMPRESSION: 1. No acute findings. 2. Mild cardiomegaly. 3. Moderate hiatal hernia.   Electronically Signed   By: Monte Fantasia M.D.   On: 10/11/2014 18:06   Nm Myocar Multi W/spect W/wall Motion / Ef  10/13/2014    Pharmacological myocardial perfusion imaging study with moderate sized,   moderate in severity ischemia noted in the mid to apical anterior wall and  mid to apical lateral and inferolateral wall.  The left ventricular ejection fraction is moderately decreased (30-44%).  EF estimated at 41%  Defect 1: There is a medium defect of moderate severity present in the  basal inferolateral, mid anterior, mid inferolateral, mid anterolateral,  apical anterior and apex location.  T wave inversion was noted during rest and stress in the inferior leads  (aVF, II and III). No EKG changes concerning for ischemia.  This is a high risk study.     EKG: Normal sinus rhythm with rate in the 70s, no significant ST changes, nonspecific T-wave abnormality  Weights: Filed Weights   10/11/14 1723 10/11/14 2233 10/13/14 0328  Weight: 180 lb (81.647 kg) 188 lb 11.2 oz (85.594 kg) 187 lb 6.4 oz (85.004 kg)     Physical Exam: Blood  pressure 146/44, pulse 63, temperature 98.1 F (36.7 C), temperature source Oral, resp. rate 18, height 5\' 4"  (1.626 m), weight 187 lb 6.4 oz (85.004 kg), SpO2 96 %. Body mass index is 32.15 kg/(m^2). General: Well developed, well nourished, in no acute distress. Head: Normocephalic, atraumatic, sclera non-icteric, no xanthomas, nares are without discharge.  Neck: Negative for carotid bruits. JVD not elevated. Lungs: Clear bilaterally to auscultation without wheezes, rales, or rhonchi. Breathing is unlabored. Heart: RRR with S1 S2. No murmurs, rubs, or gallops appreciated. Abdomen: Soft, non-tender, non-distended with normoactive bowel sounds. No hepatomegaly. No rebound/guarding. No obvious abdominal masses. Msk:  Strength and tone appear normal for age. Extremities: No clubbing or cyanosis. No edema.  Distal pedal pulses are 2+ and equal bilaterally. Neuro: Alert and oriented X 3. No facial asymmetry. No focal deficit. Moves all extremities spontaneously. Psych:  Responds to questions appropriately with a normal affect.    Assessment and Plan:  14 her  Caucasian female with history of essential hypertension, hyperlipidemia and late family onset coronary artery disease presenting with angina, positive stress test concerning for ischemia  1. Angina Negative cardiac enzymes, chest pain has resolved Stress test results reviewed with the patient today which shows moderate sized regions of ischemia anterior wall, lateral wall Given patient's classic symptoms of angina, we have recommended cardiac catheterization She agrees with above. We will schedule this for tomorrow May 13, second case  She is aware of risk and benefit,  including stroke and heart attack. Consent will be obtained by nursing   2. Hypertension essential:  Continue Cozaar  3. Hyperlipidemia unspecified:  We'll LDL less than 70 Continue Zocor  4. Gastroesophageal reflux disease without esophagitis:  PPI therapy  5. Hypothyroidism unspecified:  Synthroid  6.Venous thromboembolism prophylactic:  Heparin subcutaneous  Signed, Ida Rogue M.D., Ph.D. 10/13/2014, 7:09 PM

## 2014-10-14 NOTE — Interval H&P Note (Signed)
The patient was seen and examined. Labs and date were reviewed.

## 2014-10-14 NOTE — OR Nursing (Signed)
Off pump call made to SICU charge nurse at [place].

## 2014-10-14 NOTE — Transfer of Care (Signed)
Immediate Anesthesia Transfer of Care Note  Patient: Jenna Marshall  Procedure(s) Performed: Procedure(s): CORONARY ARTERY BYPASS GRAFTING (CABG), ON PUMP, TIMES FOUR, USING LEFT INTERNAL MAMMARY ARTERY, RIGHT GREATER SAPHENOUS VEIN HARVESTED ENDOSCOPICALLY (N/A)  Patient Location: SICU  Anesthesia Type:General  Level of Consciousness: Patient remains intubated per anesthesia plan  Airway & Oxygen Therapy: Patient remains intubated per anesthesia plan and Patient placed on Ventilator (see vital sign flow sheet for setting)  Post-op Assessment: Report given to RN and Post -op Vital signs reviewed and stable  Post vital signs: Reviewed and stable  Last Vitals:  Filed Vitals:   10/14/14 2011  BP: 106/46  Pulse: 80  Temp:   Resp:     Complications: No apparent anesthesia complications

## 2014-10-14 NOTE — OR Nursing (Signed)
45 minute call to [facility].

## 2014-10-14 NOTE — Care Management Note (Signed)
Case Management Note  Patient Details  Name: Jenna Marshall MRN: [id number] Date of Birth: November 19, 1925  Subjective/Objective:     Adm w left main dis, to or for cabg               Action/Plan:lives at home, pcp dr Deborra Medina   Expected Discharge Date:  10/21/14               Expected Discharge Plan:  [place]  In-House Referral:     Discharge planning Services     Post Acute Care Choice:    Choice offered to:     DME Arranged:    DME Agency:     HH Arranged:    [place] Agency:     Status of Service:     Medicare Important Message Given:    Date Medicare IM Given:    Medicare IM give by:    Date Additional Medicare IM Given:    Additional Medicare Important Message give by:     If discussed at [place] of Stay Meetings, dates discussed:    Additional Comments:ur ins review done.  Lacretia Leigh, RN 10/14/2014, 2:15 PM

## 2014-10-15 ENCOUNTER — Inpatient Hospital Stay (HOSPITAL_COMMUNITY): Payer: Commercial Managed Care - HMO

## 2014-10-15 LAB — CBC
HCT: 28.5 % — ABNORMAL LOW (ref 36.0–46.0)
HCT: 25.5 % — ABNORMAL LOW (ref 36.0–46.0)
HEMOGLOBIN: 8.6 g/dL — AB (ref 12.0–15.0)
Hemoglobin: 9.5 g/dL — ABNORMAL LOW (ref 12.0–15.0)
MCH: 31.4 pg (ref 26.0–34.0)
MCH: 32 pg (ref 26.0–34.0)
MCHC: 33.3 g/dL (ref 30.0–36.0)
MCHC: 33.7 g/dL (ref 30.0–36.0)
MCV: 94.1 fL (ref 78.0–100.0)
MCV: 94.8 fL (ref 78.0–100.0)
Platelets: 124 10*3/uL — ABNORMAL LOW (ref 150–400)
Platelets: 128 10*3/uL — ABNORMAL LOW (ref 150–400)
RBC: 3.03 MIL/uL — AB (ref 3.87–5.11)
RBC: 2.69 MIL/uL — AB (ref 3.87–5.11)
RDW: 13.5 % (ref 11.5–15.5)
RDW: 13.6 % (ref 11.5–15.5)
WBC: 12.7 10*3/uL — AB (ref 4.0–10.5)
WBC: 13.7 10*3/uL — AB (ref 4.0–10.5)

## 2014-10-15 LAB — GLUCOSE, CAPILLARY
GLUCOSE-CAPILLARY: 105 mg/dL — AB (ref 65–99)
GLUCOSE-CAPILLARY: 132 mg/dL — AB (ref 65–99)
GLUCOSE-CAPILLARY: 136 mg/dL — AB (ref 65–99)
GLUCOSE-CAPILLARY: 136 mg/dL — AB (ref 65–99)
GLUCOSE-CAPILLARY: 139 mg/dL — AB (ref 65–99)
Glucose-Capillary: 107 mg/dL — ABNORMAL HIGH (ref 65–99)
Glucose-Capillary: 109 mg/dL — ABNORMAL HIGH (ref 65–99)
Glucose-Capillary: 118 mg/dL — ABNORMAL HIGH (ref 65–99)
Glucose-Capillary: 121 mg/dL — ABNORMAL HIGH (ref 65–99)
Glucose-Capillary: 124 mg/dL — ABNORMAL HIGH (ref 65–99)
Glucose-Capillary: 124 mg/dL — ABNORMAL HIGH (ref 65–99)
Glucose-Capillary: 100 mg/dL — ABNORMAL HIGH (ref 65–99)
Glucose-Capillary: 122 mg/dL — ABNORMAL HIGH (ref 65–99)
Glucose-Capillary: 131 mg/dL — ABNORMAL HIGH (ref 65–99)
Glucose-Capillary: 147 mg/dL — ABNORMAL HIGH (ref 65–99)
Glucose-Capillary: 189 mg/dL — ABNORMAL HIGH (ref 65–99)

## 2014-10-15 LAB — CREATININE, SERUM
CREATININE: 0.81 mg/dL (ref 0.44–1.00)
GFR calc Af Amer: >60 mL/min (ref >60)
GFR calc non Af Amer: >60 mL/min (ref >60)

## 2014-10-15 LAB — POCT I-STAT, CHEM 8
BUN: 14 mg/dL (ref 6–20)
CALCIUM ION: 1.16 mmol/L (ref 1.13–1.30)
Chloride: 103 mmol/L (ref 101–111)
Creatinine, Ser: 0.8 mg/dL (ref 0.44–1.00)
GLUCOSE: 138 mg/dL — AB (ref 65–99)
HCT: 26 % — ABNORMAL LOW (ref 36.0–46.0)
Hemoglobin: 8.8 g/dL — ABNORMAL LOW (ref 12.0–15.0)
POTASSIUM: 4.3 mmol/L (ref 3.5–5.1)
Sodium: 136 mmol/L (ref 135–145)
TCO2: 19 mmol/L (ref 0–100)

## 2014-10-15 LAB — BASIC METABOLIC PANEL
ANION GAP: 5 (ref 5–15)
BUN: 13 mg/dL (ref 6–20)
CALCIUM: 7.5 mg/dL — AB (ref 8.9–10.3)
CO2: 23 mmol/L (ref 22–32)
Chloride: 108 mmol/L (ref 101–111)
Creatinine, Ser: 0.83 mg/dL (ref 0.44–1.00)
GLUCOSE: 132 mg/dL — AB (ref 65–99)
Potassium: 4.4 mmol/L (ref 3.5–5.1)
Sodium: 136 mmol/L (ref 135–145)

## 2014-10-15 LAB — PREPARE PLATELET PHERESIS: UNIT DIVISION: 0

## 2014-10-15 LAB — POCT I-STAT 3, ART BLOOD GAS (G3+)
ACID-BASE DEFICIT: 4 mmol/L — AB (ref 0.0–2.0)
ACID-BASE DEFICIT: 5 mmol/L — AB (ref 0.0–2.0)
Acid-base deficit: 3 mmol/L — ABNORMAL HIGH (ref 0.0–2.0)
BICARBONATE: 21.7 meq/L (ref 20.0–24.0)
Bicarbonate: 21.4 mEq/L (ref 20.0–24.0)
Bicarbonate: 23.4 mEq/L (ref 20.0–24.0)
O2 SAT: 95 %
O2 SAT: 93 %
O2 Saturation: 93 %
PCO2 ART: 44.9 mmHg (ref 35.0–45.0)
PCO2 ART: 46.7 mmHg — AB (ref 35.0–45.0)
PH ART: 7.275 — AB (ref 7.350–7.450)
PO2 ART: 78 mmHg — AB (ref 80.0–100.0)
PO2 ART: 72 mmHg — AB (ref 80.0–100.0)
Patient temperature: 36.9
Patient temperature: 37
Patient temperature: 37
TCO2: 23 mmol/L (ref 0–100)
TCO2: 23 mmol/L (ref 0–100)
TCO2: 25 mmol/L (ref 0–100)
pCO2 arterial: 39.6 mmHg (ref 35.0–45.0)
pH, Arterial: 7.339 — ABNORMAL LOW (ref 7.350–7.450)
pH, Arterial: 7.325 — ABNORMAL LOW (ref 7.350–7.450)
pO2, Arterial: 78 mmHg — ABNORMAL LOW (ref 80.0–100.0)

## 2014-10-15 LAB — MAGNESIUM
Magnesium: 3 mg/dL — ABNORMAL HIGH (ref 1.7–2.4)
Magnesium: 2.6 mg/dL — ABNORMAL HIGH (ref 1.7–2.4)

## 2014-10-15 MED ORDER — ENOXAPARIN SODIUM 40 MG/0.4ML ~~LOC~~ SOLN
40.0000 mg | Freq: Every day | SUBCUTANEOUS | Status: DC
  Administered 2014-10-15 – 2014-10-20 (×6): 40 mg via SUBCUTANEOUS
  Filled 2014-10-15 (×7): qty 0.4

## 2014-10-15 MED ORDER — INSULIN ASPART 100 UNIT/ML ~~LOC~~ SOLN
0.0000 [IU] | SUBCUTANEOUS | Status: DC
  Administered 2014-10-15 – 2014-10-16 (×3): 2 [IU] via SUBCUTANEOUS

## 2014-10-15 MED ORDER — INSULIN DETEMIR 100 UNIT/ML ~~LOC~~ SOLN
15.0000 [IU] | Freq: Once | SUBCUTANEOUS | Status: AC
  Administered 2014-10-15: 15 [IU] via SUBCUTANEOUS
  Filled 2014-10-15: qty 0.15

## 2014-10-15 MED ORDER — INSULIN DETEMIR 100 UNIT/ML ~~LOC~~ SOLN
15.0000 [IU] | Freq: Every day | SUBCUTANEOUS | Status: DC
  Filled 2014-10-15: qty 0.15

## 2014-10-15 NOTE — Progress Notes (Signed)
1 Day Post-Op Procedure(s) (LRB): CORONARY ARTERY BYPASS GRAFTING (CABG), ON PUMP, TIMES FOUR, USING LEFT INTERNAL MAMMARY ARTERY, RIGHT GREATER SAPHENOUS VEIN HARVESTED ENDOSCOPICALLY (N/A) Subjective: No complaints  Objective: Vital signs in last 24 hours: Temp:  [96.4 F (35.8 C)-98.8 F (37.1 C)] 98.2 F (36.8 C) (05/14 0700) Pulse Rate:  [53-91] 90 (05/14 0700) Cardiac Rhythm:  [-] Atrial paced (05/14 0600) Resp:  [0-25] 16 (05/14 0700) BP: (80-169)/(37-72) 95/39 mmHg (05/14 0700) SpO2:  [2 %-100 %] 97 % (05/14 0700) Arterial Line BP: (71-136)/(44-65) 112/44 mmHg (05/14 0700) FiO2 (%):  [40 %-50 %] 40 % (05/14 0200) Weight:  [89.7 kg (197 lb 12 oz)] 89.7 kg (197 lb 12 oz) (05/14 0400)  Hemodynamic parameters for last 24 hours: PAP: (19-43)/(1-18) 27/13 mmHg CO:  [3.4 L/min-4.8 L/min] 4.8 L/min CI:  [1.8 L/min/m2-2.6 L/min/m2] 2.6 L/min/m2  Intake/Output from previous day: 05/13 0701 - 05/14 0700 In: 4599.8 [P.O.:60; I.V.:3439.8; Blood:550; IV Piggyback:550] Out: 3275 [Urine:2135; Blood:850; Chest Tube:290] Intake/Output this shift:    General appearance: alert and cooperative Neurologic: intact Heart: regular rate and rhythm, S1, S2 normal, no murmur, click, rub or gallop Lungs: clear to auscultation bilaterally Extremities: edema mild Wound: dressings dry  Lab Results:  Recent Labs  10/14/14 2025 10/15/14 0330  WBC 14.4* 12.7*  HGB 10.2*  10.5* 9.5*  HCT 29.6*  31.0* 28.5*  PLT 114* 124*   BMET:  Recent Labs  10/14/14 1908 10/14/14 2025 10/15/14 0330  NA 136 137 136  K 4.6 4.0 4.4  CL 103  --  108  CO2  --   --  23  GLUCOSE 125* 142* 132*  BUN 16  --  13  CREATININE 0.70  --  0.83  CALCIUM  --   --  7.5*    PT/INR:  Recent Labs  10/14/14 2025  LABPROT 19.6*  INR 1.64*   ABG    Component Value Date/Time   PHART 7.325* 10/15/2014 0427   HCO3 23.4 10/15/2014 0427   TCO2 25 10/15/2014 0427   ACIDBASEDEF 3.0* 10/15/2014 0427   O2SAT  93.0 10/15/2014 0427   CBG (last 3)   Recent Labs  10/14/14 2131  GLUCAP 136*   CXR: mild left base atelectasis  ECG: sinus 70, no acute changes  Assessment/Plan: S/P Procedure(s) (LRB): CORONARY ARTERY BYPASS GRAFTING (CABG), ON PUMP, TIMES FOUR, USING LEFT INTERNAL MAMMARY ARTERY, RIGHT GREATER SAPHENOUS VEIN HARVESTED ENDOSCOPICALLY (N/A)  She is doing well overall. Still on neo. Hold off on lopressor for now. Mobilize Diuresis Diabetes control: start levemir. d/c tubes/lines Continue foley due to patient in ICU and urinary output monitoring See progression orders   LOS: 1 day    Gaye Pollack 10/15/2014

## 2014-10-15 NOTE — Procedures (Signed)
Extubation Procedure Note  Patient Details:   Name: Jenna Marshall DOB: February 15, 1926 MRN: [id number]   Airway Documentation:  Pre Extubation: Pt completed Rapid Wean Protocol without complication. Pt follows all commands. VC 778mls, NIF -22. Positive Cuff Leak. ABG: pH: 7.33, cO2: 39.6, PaO2: 78, HcO3: 21.4, Sats 95%. Pt extubated.  Post Extubation: Pt extubated to 3L [place]. No Stridor noted. Pt able to speak name/location. IS: 424mls. Clear/Diminished BBS. RT will monitor.   Evaluation  O2 sats: stable throughout Complications: No apparent complications Patient did tolerate procedure well. Bilateral Breath Sounds: Clear, Diminished Suctioning: Oral, Airway Yes  Sharen Hint 10/15/2014, 2:36 AM

## 2014-10-15 NOTE — Progress Notes (Signed)
Patient ID: Jenna Marshall, female   DOB: 12/31/1925, 81 y.o.   MRN: [id number]  SICU Evening Rounds:  Hemodynamically stable but still on some neo  Urine output ok BMET    Component Value Date/Time   NA 136 10/15/2014 1730   K 4.3 10/15/2014 1730   CL 103 10/15/2014 1730   CO2 23 10/15/2014 0330   GLUCOSE 138* 10/15/2014 1730   BUN 14 10/15/2014 1730   CREATININE 0.80 10/15/2014 1730   CALCIUM 7.5* 10/15/2014 0330   GFRNONAA >60 10/15/2014 0330   GFRAA >60 10/15/2014 0330    CBC    Component Value Date/Time   WBC 13.7* 10/15/2014 1725   RBC 2.69* 10/15/2014 1725   HGB 8.8* 10/15/2014 1730   HCT 26.0* 10/15/2014 1730   PLT 128* 10/15/2014 1725   MCV 94.8 10/15/2014 1725   MCH 32.0 10/15/2014 1725   MCHC 33.7 10/15/2014 1725   RDW 13.6 10/15/2014 1725    Up in chair.  Will wait to diurese until off neo

## 2014-10-15 NOTE — Op Note (Signed)
CARDIOVASCULAR SURGERY OPERATIVE NOTE  10/14/2014  Surgeon:  Gaye Pollack, MD  First Assistant: Suzzanne Cloud,  PA-C   Preoperative Diagnosis:  Severe left main and multi-vessel coronary artery disease   Postoperative Diagnosis:  Same   Procedure:  1. Median Sternotomy 2. Extracorporeal circulation 3.   Coronary artery bypass grafting x 4   Left internal mammary graft to the LAD  SVG to RCA  Sequential SVG to Ramus and OM  4.   Endoscopic vein harvest from the right leg   Anesthesia:  General Endotracheal   Clinical History/Surgical Indication:  The patient is an 81 year old woman with HTN and HLD who was admitted to [facility] on Tuesday with a several week history of intermittent SSCP and aching in the forearm muscles bilaterally that was occuring with exertion and at rest. She ruled out for MI, had a high risk nuclear stress test and catheterization this am showing 99% distal LM stenosis and significant RCA stenosis. An echo showed an EF of 55-60%. Nuclear stress showed an EF of 41%. She says she has had no CP since admission Tuesday. She has 99% distal LM stenosis and requires urgent CABG. She was transferred from [place] this afternoon. Unfortunately she has had bilateral lower ext vein stripping in the distant past and may not have any usable saphenous vein. If there is no vein then we will need to use both IMA's if they are long enough and could consider using the left radial artery if doppler exam shows that it is not the primary supply to her hand. I discussed the operative procedure with the patient and son, daughter-in-law including alternatives, benefits and risks; including but not limited to bleeding, blood transfusion, infection, stroke, myocardial infarction, graft failure, heart block requiring a permanent pacemaker, organ dysfunction, and death. I also discussed the  possible need to use the left radial artery if it is not the primary arterial supply to the left hand. [name] understands and agrees to proceed.   Preparation:  The patient was seen in the preoperative holding area and the correct patient, correct operation were confirmed with the patient after reviewing the medical record and catheterization. The consent was signed by me. Preoperative antibiotics were given. A pulmonary arterial line and radial arterial line were placed by the anesthesia team. The patient was taken back to the operating room and positioned supine on the operating room table. After being placed under general endotracheal anesthesia by the anesthesia team a foley catheter was placed. The neck, chest, abdomen, and both legs were prepped with betadine soap and solution and draped in the usual sterile manner. A surgical time-out was taken and the correct patient and operative procedure were confirmed with the nursing and anesthesia staff.   Cardiopulmonary Bypass:  A median sternotomy was performed. The pericardium was opened in the midline. Right ventricular function appeared normal. The ascending aorta was of normal size and had no palpable plaque. There were no contraindications to aortic cannulation or cross-clamping. The patient was fully systemically heparinized and the ACT was maintained > 400 sec. The proximal aortic arch was cannulated with a 20 F aortic cannula for arterial inflow. Venous cannulation was performed via the right atrial appendage using a two-staged venous cannula. An antegrade cardioplegia/vent cannula was inserted into the mid-ascending aorta. Aortic occlusion was performed with a single cross-clamp. Systemic cooling to 32 degrees Centigrade and topical cooling of the heart with iced saline were used. Hyperkalemic antegrade cold blood cardioplegia was used  to induce diastolic arrest and was then given at about 20 minute intervals throughout the period of  arrest to maintain myocardial temperature at or below 10 degrees centigrade. A temperature probe was inserted into the interventricular septum and an insulating pad was placed in the pericardium.   Left internal mammary harvest:  The left side of the sternum was retracted using the Rultract retractor. The left internal mammary artery was harvested as a pedicle graft. All side branches were clipped. It was a medium-sized vessel of good quality with excellent blood flow. It was ligated distally and divided. It was sprayed with topical papaverine solution to prevent vasospasm.   Endoscopic vein harvest:  The right greater saphenous vein was harvested endoscopically through a 2 cm incision medial to the right knee. It was harvested from the upper thigh to below the knee. It was a medium-sized vein of good quality with slightly thickened walls. The side branches were all ligated with 4-0 silk ties.    Coronary arteries:  The coronary arteries were examined.   LAD:  Moderate-sized vessel with no distal disease  LCX:  Ramus and OM are both moderate-sized vessels with no distal disease  RCA:  Diffusely diseased with calcific plaque but the anterior surface of the vessel was soft.   Grafts:  1. LIMA to the LAD: 1.75 mm. It was sewn end to side using 8-0 prolene continuous suture. 2. SVG to RCA:  2.5 mm. It was sewn end to side using 7-0 prolene continuous suture. 3. Sequential SVG to Ramus:  1.75 mm. It was sewn sequential side to side using 7-0 prolene continuous suture. 4. Sequential SVG to OM:  1.75 mm. It was sewn sequential end to side using 7-0 prolene continuous suture.  The proximal vein graft anastomoses were performed to the mid-ascending aorta using continuous 6-0 prolene suture. Graft markers were placed around the proximal anastomoses.   Completion:  The patient was rewarmed to 37 degrees Centigrade. The clamp was removed from the LIMA pedicle and there was rapid warming of  the septum and return of ventricular fibrillation. The crossclamp was removed with a time of 71 minutes. There was spontaneous return of sinus rhythm. The distal and proximal anastomoses were checked for hemostasis. The position of the grafts was satisfactory. Two temporary epicardial pacing wires were placed on the right atrium and two on the right ventricle. The patient was weaned from CPB without difficulty on no inotropes. CPB time was 89 minutes. Cardiac output was 5 LPM. Heparin was fully reversed with protamine and the aortic and venous cannulas removed. Hemostasis was achieved. Mediastinal and left pleural drainage tubes were placed. The sternum was closed with double #6 stainless steel wires. The fascia was closed with continuous # 1 vicryl suture. The subcutaneous tissue was closed with 2-0 vicryl continuous suture. The skin was closed with 3-0 vicryl subcuticular suture. All sponge, needle, and instrument counts were reported correct at the end of the case. Dry sterile dressings were placed over the incisions and around the chest tubes which were connected to pleurevac suction. The patient was then transported to the surgical intensive care unit in critical but stable condition.

## 2014-10-16 ENCOUNTER — Inpatient Hospital Stay (HOSPITAL_COMMUNITY): Payer: Commercial Managed Care - HMO

## 2014-10-16 LAB — GLUCOSE, CAPILLARY
Glucose-Capillary: 101 mg/dL — ABNORMAL HIGH (ref 65–99)
Glucose-Capillary: 114 mg/dL — ABNORMAL HIGH (ref 65–99)
Glucose-Capillary: 115 mg/dL — ABNORMAL HIGH (ref 65–99)
Glucose-Capillary: 122 mg/dL — ABNORMAL HIGH (ref 65–99)
Glucose-Capillary: 126 mg/dL — ABNORMAL HIGH (ref 65–99)
Glucose-Capillary: 144 mg/dL — ABNORMAL HIGH (ref 65–99)

## 2014-10-16 LAB — CBC
HEMATOCRIT: 24.8 % — AB (ref 36.0–46.0)
Hemoglobin: 8.3 g/dL — ABNORMAL LOW (ref 12.0–15.0)
MCH: 32.2 pg (ref 26.0–34.0)
MCHC: 33.5 g/dL (ref 30.0–36.0)
MCV: 96.1 fL (ref 78.0–100.0)
Platelets: 122 10*3/uL — ABNORMAL LOW (ref 150–400)
RBC: 2.58 MIL/uL — ABNORMAL LOW (ref 3.87–5.11)
RDW: 13.9 % (ref 11.5–15.5)
WBC: 13.8 10*3/uL — ABNORMAL HIGH (ref 4.0–10.5)

## 2014-10-16 LAB — BASIC METABOLIC PANEL
ANION GAP: 5 (ref 5–15)
BUN: 12 mg/dL (ref 6–20)
CHLORIDE: 105 mmol/L (ref 101–111)
CO2: 24 mmol/L (ref 22–32)
Calcium: 7.5 mg/dL — ABNORMAL LOW (ref 8.9–10.3)
Creatinine, Ser: 0.78 mg/dL (ref 0.44–1.00)
GFR calc Af Amer: >60 mL/min (ref >60)
GFR calc non Af Amer: >60 mL/min (ref >60)
Glucose, Bld: 128 mg/dL — ABNORMAL HIGH (ref 65–99)
POTASSIUM: 4.5 mmol/L (ref 3.5–5.1)
Sodium: 134 mmol/L — ABNORMAL LOW (ref 135–145)

## 2014-10-16 MED ORDER — SODIUM CHLORIDE 0.9 % IV SOLN: Freq: Once | INTRAVENOUS | Status: DC

## 2014-10-16 MED ORDER — INSULIN DETEMIR 100 UNIT/ML ~~LOC~~ SOLN
10.0000 [IU] | Freq: Every day | SUBCUTANEOUS | Status: DC
  Administered 2014-10-16 – 2014-10-19 (×4): 10 [IU] via SUBCUTANEOUS
  Filled 2014-10-16 (×5): qty 0.1

## 2014-10-16 MED ORDER — INSULIN ASPART 100 UNIT/ML ~~LOC~~ SOLN
0.0000 [IU] | Freq: Three times a day (TID) | SUBCUTANEOUS | Status: DC
  Administered 2014-10-16 – 2014-10-18 (×5): 2 [IU] via SUBCUTANEOUS

## 2014-10-16 NOTE — Clinical Documentation Improvement (Signed)
"  She is still requiring neo at increasing dose and has acute postop blood loss anemia so will transfuse 1 unit of prbc's today. Wean neo as tolerated. She is A-paced at 90 to maximize BP" id documented in progress note 10/16/14.  Please document if a condition below provides greater specificity regarding the findings in the progress note noted above:   - Shock, including type  - Postoperative Hypotension requiring pressor support  - Other condition  - Unable to clinically determine  Thank You, Erling Conte ,RN Clinical Documentation Specialist:  [facility]

## 2014-10-16 NOTE — Progress Notes (Signed)
Patient ID: Jenna Marshall, female   DOB: 1925-12-27, 81 y.o.   MRN: [id number]  SICU Evening Rounds:  Hemodynamics stable. Neo decreased after transfusion. Urine output ok

## 2014-10-16 NOTE — Progress Notes (Signed)
2 Days Post-Op Procedure(s) (LRB): CORONARY ARTERY BYPASS GRAFTING (CABG), ON PUMP, TIMES FOUR, USING LEFT INTERNAL MAMMARY ARTERY, RIGHT GREATER SAPHENOUS VEIN HARVESTED ENDOSCOPICALLY (N/A) Subjective:  No complaints. Says she feels well today  Objective: Vital signs in last 24 hours: Temp:  [97.4 F (36.3 C)-98.2 F (36.8 C)] 97.7 F (36.5 C) (05/15 0736) Pulse Rate:  [41-92] 41 (05/15 0830) Cardiac Rhythm:  [-] Atrial paced (05/15 0800) Resp:  [9-25] 14 (05/15 0830) BP: (56-116)/(29-86) 102/41 mmHg (05/15 0830) SpO2:  [81 %-100 %] 81 % (05/15 0830) Arterial Line BP: (54-137)/(36-73) 76/73 mmHg (05/14 1300) Weight:  [91.4 kg (201 lb 8 oz)] 91.4 kg (201 lb 8 oz) (05/15 0645)  Hemodynamic parameters for last 24 hours:    Intake/Output from previous day: 05/14 0701 - 05/15 0700 In: 1647.4 [P.O.:480; I.V.:1017.4; IV Piggyback:150] Out: 1095 [Urine:835; Chest Tube:260] Intake/Output this shift: Total I/O In: 312.6 [P.O.:240; I.V.:72.6] Out: 30 [Urine:30]  General appearance: alert and cooperative Neurologic: intact Heart: regular rate and rhythm, S1, S2 normal, no murmur, click, rub or gallop Lungs: diminished breath sounds base - left and LLL Extremities: extremities normal, atraumatic, no cyanosis or edema Wound: dressing dry  Lab Results:  Recent Labs  10/15/14 1725 10/15/14 1730 10/16/14 0356  WBC 13.7*  --  13.8*  HGB 8.6* 8.8* 8.3*  HCT 25.5* 26.0* 24.8*  PLT 128*  --  122*   BMET:  Recent Labs  10/15/14 0330  10/15/14 1730 10/16/14 0356  NA 136  --  136 134*  K 4.4  --  4.3 4.5  CL 108  --  103 105  CO2 23  --   --  24  GLUCOSE 132*  --  138* 128*  BUN 13  --  14 12  CREATININE 0.83  < > 0.80 0.78  CALCIUM 7.5*  --   --  7.5*  < > = values in this interval not displayed.  PT/INR:  Recent Labs  10/14/14 2025  LABPROT 19.6*  INR 1.64*   ABG    Component Value Date/Time   PHART 7.325* 10/15/2014 0427   HCO3 23.4 10/15/2014 0427   TCO2 19  10/15/2014 1730   ACIDBASEDEF 3.0* 10/15/2014 0427   O2SAT 93.0 10/15/2014 0427   CBG (last 3)   Recent Labs  10/16/14 0004 10/16/14 0359 10/16/14 0733  GLUCAP 114* 122* 101*    Assessment/Plan: S/P Procedure(s) (LRB): CORONARY ARTERY BYPASS GRAFTING (CABG), ON PUMP, TIMES FOUR, USING LEFT INTERNAL MAMMARY ARTERY, RIGHT GREATER SAPHENOUS VEIN HARVESTED ENDOSCOPICALLY (N/A)  She is still requiring neo at increasing dose and has acute postop blood loss anemia so will transfuse 1 unit of prbc's today. Wean neo as tolerated.  She is A-paced at 90 to maximize BP. Ambulate DC foley Diabetes: glucose under good control. Will decrease Levemir to 10.   LOS: 2 days    Jenna Marshall 10/16/2014

## 2014-10-17 ENCOUNTER — Encounter (HOSPITAL_COMMUNITY): Payer: Self-pay | Admitting: Surgery

## 2014-10-17 LAB — CBC
HCT: 27.7 % — ABNORMAL LOW (ref 36.0–46.0)
HEMOGLOBIN: 9.4 g/dL — AB (ref 12.0–15.0)
MCH: 32 pg (ref 26.0–34.0)
MCHC: 33.9 g/dL (ref 30.0–36.0)
MCV: 94.2 fL (ref 78.0–100.0)
PLATELETS: 106 10*3/uL — AB (ref 150–400)
RBC: 2.94 MIL/uL — ABNORMAL LOW (ref 3.87–5.11)
RDW: 14.7 % (ref 11.5–15.5)
WBC: 12.3 10*3/uL — ABNORMAL HIGH (ref 4.0–10.5)

## 2014-10-17 LAB — BASIC METABOLIC PANEL
Anion gap: 3 — ABNORMAL LOW (ref 5–15)
BUN: 14 mg/dL (ref 6–20)
CALCIUM: 7.6 mg/dL — AB (ref 8.9–10.3)
CO2: 25 mmol/L (ref 22–32)
Chloride: 103 mmol/L (ref 101–111)
Creatinine, Ser: 0.79 mg/dL (ref 0.44–1.00)
GFR calc non Af Amer: >60 mL/min (ref >60)
Glucose, Bld: 124 mg/dL — ABNORMAL HIGH (ref 65–99)
POTASSIUM: 4.8 mmol/L (ref 3.5–5.1)
Sodium: 131 mmol/L — ABNORMAL LOW (ref 135–145)

## 2014-10-17 LAB — GLUCOSE, CAPILLARY
Glucose-Capillary: 109 mg/dL — ABNORMAL HIGH (ref 65–99)
Glucose-Capillary: 118 mg/dL — ABNORMAL HIGH (ref 65–99)
Glucose-Capillary: 122 mg/dL — ABNORMAL HIGH (ref 65–99)
Glucose-Capillary: 128 mg/dL — ABNORMAL HIGH (ref 65–99)

## 2014-10-17 MED ORDER — MIDODRINE HCL 5 MG PO TABS
10.0000 mg | ORAL_TABLET | Freq: Three times a day (TID) | ORAL | Status: DC
  Administered 2014-10-17 – 2014-10-20 (×10): 10 mg via ORAL
  Filled 2014-10-17 (×13): qty 2

## 2014-10-17 MED ORDER — MIDODRINE HCL 5 MG PO TABS
5.0000 mg | ORAL_TABLET | Freq: Three times a day (TID) | ORAL | Status: DC
  Filled 2014-10-17 (×4): qty 1

## 2014-10-17 MED FILL — Electrolyte-R (PH 7.4) Solution: INTRAVENOUS | Qty: 3000 | Status: AC

## 2014-10-17 MED FILL — Lidocaine HCl IV Inj 20 MG/ML: INTRAVENOUS | Qty: 5 | Status: AC

## 2014-10-17 MED FILL — Albumin, Human Inj 5%: INTRAVENOUS | Qty: 250 | Status: AC

## 2014-10-17 MED FILL — Mannitol IV Soln 20%: INTRAVENOUS | Qty: 500 | Status: AC

## 2014-10-17 MED FILL — Sodium Chloride IV Soln 0.9%: INTRAVENOUS | Qty: 2000 | Status: AC

## 2014-10-17 MED FILL — Sodium Bicarbonate IV Soln 8.4%: INTRAVENOUS | Qty: 50 | Status: AC

## 2014-10-17 MED FILL — Heparin Sodium (Porcine) Inj 1000 Unit/ML: INTRAMUSCULAR | Qty: 10 | Status: AC

## 2014-10-17 NOTE — Progress Notes (Signed)
3 Days Post-Op Procedure(s) (LRB): CORONARY ARTERY BYPASS GRAFTING (CABG), ON PUMP, TIMES FOUR, USING LEFT INTERNAL MAMMARY ARTERY, RIGHT GREATER SAPHENOUS VEIN HARVESTED ENDOSCOPICALLY (N/A) Subjective:  No complaints  Objective: Vital signs in last 24 hours: Temp:  [97.7 F (36.5 C)-98.9 F (37.2 C)] 98.5 F (36.9 C) (05/16 0700) Pulse Rate:  [79-92] 90 (05/16 0700) Cardiac Rhythm:  [-] Atrial paced (05/15 2000) Resp:  [11-33] 20 (05/16 0700) BP: (83-137)/(41-103) 114/48 mmHg (05/16 0700) SpO2:  [88 %-100 %] 92 % (05/16 0700) Weight:  [92.534 kg (204 lb)] 92.534 kg (204 lb) (05/16 0600)  Hemodynamic parameters for last 24 hours:    Intake/Output from previous day: 05/15 0701 - 05/16 0700 In: 1813.1 [P.O.:720; I.V.:758.1; Blood:335] Out: 985 [Urine:985] Intake/Output this shift:    General appearance: alert and cooperative Neurologic: intact Heart: regular rate and rhythm, S1, S2 normal, no murmur, click, rub or gallop Lungs: clear to auscultation bilaterally Extremities: extremities normal, atraumatic, no cyanosis or edema Wound: dressing dry  Lab Results:  Recent Labs  10/16/14 0356 10/17/14 0420  WBC 13.8* 12.3*  HGB 8.3* 9.4*  HCT 24.8* 27.7*  PLT 122* 106*   BMET:  Recent Labs  10/16/14 0356 10/17/14 0420  NA 134* 131*  K 4.5 4.8  CL 105 103  CO2 24 25  GLUCOSE 128* 124*  BUN 12 14  CREATININE 0.78 0.79  CALCIUM 7.5* 7.6*    PT/INR:  Recent Labs  10/14/14 2025  LABPROT 19.6*  INR 1.64*   ABG    Component Value Date/Time   PHART 7.325* 10/15/2014 0427   HCO3 23.4 10/15/2014 0427   TCO2 19 10/15/2014 1730   ACIDBASEDEF 3.0* 10/15/2014 0427   O2SAT 93.0 10/15/2014 0427   CBG (last 3)   Recent Labs  10/16/14 1728 10/16/14 2151 10/17/14 0800  GLUCAP 144* 115* 109*    Assessment/Plan: S/P Procedure(s) (LRB): CORONARY ARTERY BYPASS GRAFTING (CABG), ON PUMP, TIMES FOUR, USING LEFT INTERNAL MAMMARY ARTERY, RIGHT GREATER SAPHENOUS  VEIN HARVESTED ENDOSCOPICALLY (N/A)  She remains on neo 15 mcg to maintain MAP at 60. Hgb is 9.4 after transfusion. Will start midodrine 5 tid so that we can wean off neo. Her EF is normal. No beta blocker at this time. She is being A-paced at 90 to maximize BP.  Continue mobilization. PT consult   LOS: 3 days    Jenna Marshall 10/17/2014

## 2014-10-17 NOTE — Progress Notes (Signed)
PT Cancellation Note  Patient Details Name: Jenna Marshall MRN: [id number] DOB: Oct 16, 1925   Cancelled Treatment:    Reason Eval/Treat Not Completed: Patient at procedure or test/unavailable--per RN, pt on bedrest until 15:45 due to line pulled. Will attempt to see later today as schedule permits or see 8/18 a.m.   Tymesha Ditmore 10/17/2014, 3:18 PM  Pager [phone]

## 2014-10-17 NOTE — Plan of Care (Signed)
Problem: Phase II - Intermediate Post-Op Goal: Activity Progressed Outcome: Progressing PT consult

## 2014-10-17 NOTE — Progress Notes (Signed)
      [address]       ,[place] [id number]             [phone]      No complaints Requiring a lot of assistance to mobilize  BP 109/50 mmHg  Pulse 72  Temp(Src) 98.2 F (36.8 C) (Oral)  Resp 14  Wt 204 lb (92.534 kg)  SpO2 96%   Intake/Output Summary (Last 24 hours) at 10/17/14 1822 Last data filed at 10/17/14 1500  Gross per 24 hour  Intake  893.1 ml  Output   1275 ml  Net -381.9 ml    Continue present care  Remo Lipps C. Roxan Hockey, MD [facility] [phone]

## 2014-10-18 LAB — TYPE AND SCREEN
ABO/RH(D): B NEG
Antibody Screen: NEGATIVE
Unit division: 0
Unit division: 0

## 2014-10-18 LAB — GLUCOSE, CAPILLARY
Glucose-Capillary: 105 mg/dL — ABNORMAL HIGH (ref 65–99)
Glucose-Capillary: 140 mg/dL — ABNORMAL HIGH (ref 65–99)
Glucose-Capillary: 137 mg/dL — ABNORMAL HIGH (ref 65–99)
Glucose-Capillary: 114 mg/dL — ABNORMAL HIGH (ref 65–99)

## 2014-10-18 MED ORDER — ACETAMINOPHEN 325 MG PO TABS
650.0000 mg | ORAL_TABLET | Freq: Four times a day (QID) | ORAL | Status: DC | PRN
  Administered 2014-10-18 – 2014-10-19 (×2): 650 mg via ORAL
  Filled 2014-10-18 (×2): qty 2

## 2014-10-18 MED ORDER — SODIUM CHLORIDE 0.9 % IJ SOLN
3.0000 mL | INTRAMUSCULAR | Status: DC | PRN
  Administered 2014-10-19: 3 mL via INTRAVENOUS
  Filled 2014-10-18: qty 3

## 2014-10-18 MED ORDER — ONDANSETRON HCL 4 MG PO TABS: 4.0000 mg | ORAL_TABLET | Freq: Four times a day (QID) | ORAL | Status: DC | PRN

## 2014-10-18 MED ORDER — POTASSIUM CHLORIDE CRYS ER 20 MEQ PO TBCR
20.0000 meq | EXTENDED_RELEASE_TABLET | Freq: Every day | ORAL | Status: DC
  Administered 2014-10-19: 20 meq via ORAL
  Filled 2014-10-18 (×3): qty 1

## 2014-10-18 MED ORDER — ONDANSETRON HCL 4 MG/2ML IJ SOLN
4.0000 mg | Freq: Four times a day (QID) | INTRAMUSCULAR | Status: DC | PRN
  Administered 2014-10-19: 4 mg via INTRAVENOUS
  Filled 2014-10-18: qty 2

## 2014-10-18 MED ORDER — DOCUSATE SODIUM 100 MG PO CAPS
200.0000 mg | ORAL_CAPSULE | Freq: Every day | ORAL | Status: DC
  Administered 2014-10-19 – 2014-10-21 (×3): 200 mg via ORAL
  Filled 2014-10-18 (×3): qty 2

## 2014-10-18 MED ORDER — SODIUM CHLORIDE 0.9 % IJ SOLN
3.0000 mL | Freq: Two times a day (BID) | INTRAMUSCULAR | Status: DC
  Administered 2014-10-18 – 2014-10-21 (×4): 3 mL via INTRAVENOUS

## 2014-10-18 MED ORDER — ASPIRIN EC 325 MG PO TBEC
325.0000 mg | DELAYED_RELEASE_TABLET | Freq: Every day | ORAL | Status: DC
  Administered 2014-10-19 – 2014-10-21 (×3): 325 mg via ORAL
  Filled 2014-10-18 (×4): qty 1

## 2014-10-18 MED ORDER — BISACODYL 5 MG PO TBEC
10.0000 mg | DELAYED_RELEASE_TABLET | Freq: Every day | ORAL | Status: DC | PRN
  Administered 2014-10-21: 10 mg via ORAL
  Filled 2014-10-18: qty 2

## 2014-10-18 MED ORDER — MOVING RIGHT ALONG BOOK
Freq: Once | Status: AC
  Administered 2014-10-18: 18:00:00
  Filled 2014-10-18 (×2): qty 1

## 2014-10-18 MED ORDER — BISACODYL 10 MG RE SUPP: 10.0000 mg | Freq: Every day | RECTAL | Status: DC | PRN

## 2014-10-18 MED ORDER — FAMOTIDINE 20 MG PO TABS
20.0000 mg | ORAL_TABLET | Freq: Two times a day (BID) | ORAL | Status: DC
  Administered 2014-10-18 – 2014-10-21 (×6): 20 mg via ORAL
  Filled 2014-10-18 (×7): qty 1

## 2014-10-18 MED ORDER — SODIUM CHLORIDE 0.9 % IV SOLN: 250.0000 mL | INTRAVENOUS | Status: DC | PRN

## 2014-10-18 NOTE — Progress Notes (Signed)
4 Days Post-Op Procedure(s) (LRB): CORONARY ARTERY BYPASS GRAFTING (CABG), ON PUMP, TIMES FOUR, USING LEFT INTERNAL MAMMARY ARTERY, RIGHT GREATER SAPHENOUS VEIN HARVESTED ENDOSCOPICALLY (N/A) Subjective: No complaints. Says she feels stronger today  Objective: Vital signs in last 24 hours: Temp:  [97.4 F (36.3 C)-98.3 F (36.8 C)] 97.4 F (36.3 C) (05/17 0000) Pulse Rate:  [64-90] 78 (05/17 0700) Cardiac Rhythm:  [-] Normal sinus rhythm (05/16 2300) Resp:  [12-23] 13 (05/17 0700) BP: (93-128)/(38-75) 107/40 mmHg (05/17 0600) SpO2:  [93 %-99 %] 93 % (05/17 0700) Weight:  [92.987 kg (205 lb)] 92.987 kg (205 lb) (05/17 0600)  Hemodynamic parameters for last 24 hours:    Intake/Output from previous day: 05/16 0701 - 05/17 0700 In: 835.2 [P.O.:720; I.V.:115.2] Out: 1175 [Urine:1175] Intake/Output this shift:    General appearance: alert and cooperative Heart: regular rate and rhythm, S1, S2 normal, no murmur, click, rub or gallop Lungs: diminished breath sounds bibasilar Extremities: edema mild Wound: dressing dry  Lab Results:  Recent Labs  10/16/14 0356 10/17/14 0420  WBC 13.8* 12.3*  HGB 8.3* 9.4*  HCT 24.8* 27.7*  PLT 122* 106*   BMET:  Recent Labs  10/16/14 0356 10/17/14 0420  NA 134* 131*  K 4.5 4.8  CL 105 103  CO2 24 25  GLUCOSE 128* 124*  BUN 12 14  CREATININE 0.78 0.79  CALCIUM 7.5* 7.6*    PT/INR: No results for input(s): LABPROT, INR in the last 72 hours. ABG    Component Value Date/Time   PHART 7.325* 10/15/2014 0427   HCO3 23.4 10/15/2014 0427   TCO2 19 10/15/2014 1730   ACIDBASEDEF 3.0* 10/15/2014 0427   O2SAT 93.0 10/15/2014 0427   CBG (last 3)   Recent Labs  10/17/14 1200 10/17/14 1551 10/17/14 2148  GLUCAP 118* 122* 128*    Assessment/Plan: S/P Procedure(s) (LRB): CORONARY ARTERY BYPASS GRAFTING (CABG), ON PUMP, TIMES FOUR, USING LEFT INTERNAL MAMMARY ARTERY, RIGHT GREATER SAPHENOUS VEIN HARVESTED ENDOSCOPICALLY (N/A)   She is hemodynamically stable off neo since midodrine started. No beta blocker. Mobilize, physical therapy. Diuresis Diabetes control Plan for transfer to step-down: see transfer orders   LOS: 4 days    Gaye Pollack 10/18/2014

## 2014-10-18 NOTE — Evaluation (Signed)
Physical Therapy Evaluation Patient Details Name: Jenna Marshall MRN: [id number] DOB: 11-19-1925 Today's Date: 10/18/2014   History of Present Illness  Adm 10/14/14 for CABG x 4 due to severe CAD found on cardiac cath  PMHx-HTN, HLD, CAD  Clinical Impression  Patient is s/p above surgery resulting in functional limitations due to the deficits listed below (see PT Problem List). Pt lives alone and reports she has not discussed discharge plan with her daughters (attempted to call [name] with no answer). Patient will benefit from skilled PT to increase their independence and safety with mobility to allow discharge to the venue listed below.  If she has 24 hr care, she will be able to discharge home with HHPT.     Follow Up Recommendations Supervision/Assistance - 24 hour;Other (comment) (TBA--hopeful children can provide 24/7 care)    Equipment Recommendations  None recommended by PT    Recommendations for Other Services OT consult     Precautions / Restrictions Precautions Precautions: Fall      Mobility  Bed Mobility                  Transfers Overall transfer level: Needs assistance Equipment used: Rolling walker (2 wheeled) Transfers: Sit to/from Stand Sit to Stand: Min assist         General transfer comment: for anterior and vertical translation to stand  Ambulation/Gait Ambulation/Gait assistance: Min assist;+2 safety/equipment Ambulation Distance (Feet): 150 Feet Assistive device: Rolling walker (2 wheeled) Gait Pattern/deviations: Step-through pattern;Decreased stride length;Trunk flexed   Gait velocity interpretation: Below normal speed for age/gender General Gait Details: able to talk while walking; no standing rest breaks; RW slightly too far ahead (cues for safe use)  Stairs            Wheelchair Mobility    Modified Rankin (Stroke Patients Only)       Balance Overall balance assessment: Needs assistance         Standing balance  support: No upper extremity supported Standing balance-Leahy Scale: Fair Standing balance comment: dynamic balance TBA                             Pertinent Vitals/Pain SaO2 93% on 3L with walking HR 80s MAP 74  Pain Assessment: No/denies pain    Home Living Family/patient expects to be discharged to:: Private residence Living Arrangements: Alone Available Help at Discharge: Family;Available PRN/intermittently (daughters) Type of Home: House Home Access: Stairs to enter Entrance Stairs-Rails: None Entrance Stairs-Number of Steps: 1 (back) Home Layout: One level Home Equipment: [place] - 2 wheels;Cane - single point;Grab bars - toilet;Grab bars - tub/shower (walkin shower)      Prior Function Level of Independence: Independent with assistive device(s)         Comments: uses cane all the time due to unsteadiness     Hand Dominance   Dominant Hand: Right    Extremity/Trunk Assessment   Upper Extremity Assessment: Overall WFL for tasks assessed (within limits of sternal precautions)           Lower Extremity Assessment: Overall WFL for tasks assessed (quads symmetric; 4/5)      Cervical / Trunk Assessment: Kyphotic  Communication   Communication: HOH  Cognition Arousal/Alertness: Awake/alert Behavior During Therapy: WFL for tasks assessed/performed Overall Cognitive Status: No family/caregiver present to determine baseline cognitive functioning       Memory: Decreased short-term memory;Decreased recall of precautions  General Comments      Exercises General Exercises - Lower Extremity Ankle Circles/Pumps: AROM;Both;10 reps      Assessment/Plan    PT Assessment Patient needs continued PT services  PT Diagnosis Difficulty walking   PT Problem List Decreased balance;Decreased mobility;Decreased knowledge of use of DME;Decreased knowledge of precautions;Obesity  PT Treatment Interventions DME instruction;Gait  training;Functional mobility training;Therapeutic activities;Therapeutic exercise;Balance training;Cognitive remediation;Patient/family education   PT Goals (Current goals can be found in the Care Plan section) Acute Rehab PT Goals Patient Stated Goal: return home and care for herself PT Goal Formulation: With patient Time For Goal Achievement: 10/25/14 Potential to Achieve Goals: Good    Frequency Min 3X/week   Barriers to discharge Decreased caregiver support patient unsure if daughters can stay with her    Co-evaluation               End of Session Equipment Utilized During Treatment: Gait belt;Oxygen Activity Tolerance: Patient tolerated treatment well Patient left: in chair;with call bell/phone within reach Nurse Communication: Mobility status         Time: 1740-8144 PT Time Calculation (min) (ACUTE ONLY): 16 min   Charges:   PT Evaluation $Initial PT Evaluation Tier I: 1 Procedure     PT G Codes:        [id number] 14-Nov-2014, 9:20 AM Pager [phone]

## 2014-10-18 NOTE — Progress Notes (Signed)
Patient ID: Jenna Marshall, female   DOB: 02/17/1926, 81 y.o.   MRN: [id number]  SICU Evening Rounds:  Stable day.  Ambulated twice.   Awaiting bed on 2W

## 2014-10-19 ENCOUNTER — Inpatient Hospital Stay (HOSPITAL_COMMUNITY): Payer: Commercial Managed Care - HMO

## 2014-10-19 LAB — GLUCOSE, CAPILLARY
Glucose-Capillary: 107 mg/dL — ABNORMAL HIGH (ref 65–99)
Glucose-Capillary: 109 mg/dL — ABNORMAL HIGH (ref 65–99)
Glucose-Capillary: 108 mg/dL — ABNORMAL HIGH (ref 65–99)
Glucose-Capillary: 112 mg/dL — ABNORMAL HIGH (ref 65–99)

## 2014-10-19 MED ORDER — FUROSEMIDE 10 MG/ML IJ SOLN
40.0000 mg | Freq: Once | INTRAMUSCULAR | Status: AC
  Administered 2014-10-19: 40 mg via INTRAVENOUS
  Filled 2014-10-19: qty 4

## 2014-10-19 MED ORDER — POTASSIUM CHLORIDE CRYS ER 20 MEQ PO TBCR
40.0000 meq | EXTENDED_RELEASE_TABLET | Freq: Once | ORAL | Status: AC
  Administered 2014-10-19: 40 meq via ORAL
  Filled 2014-10-19: qty 2

## 2014-10-19 NOTE — Care Management Note (Signed)
Case Management Note  Patient Details  Name: Jenna Marshall MRN: [id number] Date of Birth: 03-17-1926  Subjective/Objective:                  Talked with daughter, Jocelyn Lamer,  Patient lives at home alone, would like to go home, but daughters unable to do 24 hour supervision.  Would like [facility] SN[facility] for rehab.   Action/Plan:   Expected Discharge Date:  10/21/14               Expected Discharge Plan:  Skilled Nursing Facility  In-House Referral:  Clinical Social Work  Discharge planning Services     Post Acute Care Choice:    Choice offered to:     DME Arranged:    DME Agency:     HH Arranged:    HH Agency:     Status of Service:  In process, will continue to follow  Medicare Important Message Given:    Yes Date Medicare IM Given:    10-19-2014 Medicare IM give by:    Vergie Living  Date Additional Medicare IM Given:    Additional Medicare Important Message give by:     If discussed at [place] of Stay Meetings, dates discussed:    Additional Comments:  Vergie Living, RN 10/19/2014, 2:40 PM

## 2014-10-19 NOTE — Clinical Social Work Note (Signed)
CSW received consult for SNF placement, CSW did not have time to assess patient will complete in the morning.  Jones Broom. [place], MSW, [place] 10/19/2014 5:24 PM

## 2014-10-19 NOTE — Progress Notes (Signed)
Physical Therapy Treatment Patient Details Name: Jenna Marshall MRN: [id number] DOB: 29-Jul-1925 Today's Date: 10/19/2014    History of Present Illness Adm 10/14/14 for CABG x 4 due to severe CAD found on cardiac cath  PMHx-HTN, HLD, CAD    PT Comments    Pt limited during session today by fatigue and nausea. Pt states she had a bad night with her breathing and didn't get a lot of sleep. Further discussed d/c plans and pt reports that her children have been working out home assistance for her however she is not sure if it will be 24 hour care. Pt was not able to tolerate as much gait training as last session due to fatigue. If further progress is not made with transfer and gait training, may want to consider SNF at d/c for continued rehab prior to return home. Will continue to follow.   Follow Up Recommendations  Supervision/Assistance - 24 hour;Other (comment)     Equipment Recommendations  None recommended by PT    Recommendations for Other Services OT consult     Precautions / Restrictions Precautions Precautions: Fall;Sternal Precaution Comments: Pt could not state sternal precautions Restrictions Weight Bearing Restrictions: Yes    Mobility  Bed Mobility               General bed mobility comments: Pt sitting up in recliner chair upon PT arrival.   Transfers Overall transfer level: Needs assistance Equipment used: Rolling walker (2 wheeled) Transfers: Sit to/from Stand Sit to Stand: Max assist;Min assist         General transfer comment: Max assist and multiple attempts to stand from the recliner chair which was boosted up with pillows. Min assist to stand from the higher BSC. VC's for maintaining sternal precautions.   Ambulation/Gait Ambulation/Gait assistance: Min assist Ambulation Distance (Feet): 40 Feet Assistive device: Rolling walker (2 wheeled) Gait Pattern/deviations: Step-through pattern;Decreased stride length;Trunk flexed Gait velocity:  Decreased Gait velocity interpretation: Below normal speed for age/gender General Gait Details: x20 feet to and from bathroom. Pt reports she feels like she may pass out - vitals appeared WNL throughout gait training. Occasional assist required for walker direction.    Stairs            Wheelchair Mobility    Modified Rankin (Stroke Patients Only)       Balance Overall balance assessment: Needs assistance Sitting-balance support: Feet supported;No upper extremity supported Sitting balance-Leahy Scale: Fair     Standing balance support: Bilateral upper extremity supported Standing balance-Leahy Scale: Poor Standing balance comment: Today pt required UE support to maintain standing balance.                     Cognition Arousal/Alertness: Awake/alert Behavior During Therapy: WFL for tasks assessed/performed Overall Cognitive Status: No family/caregiver present to determine baseline cognitive functioning       Memory: Decreased short-term memory;Decreased recall of precautions              Exercises      General Comments        Pertinent Vitals/Pain Pain Assessment: No/denies pain    Home Living                      Prior Function            PT Goals (current goals can now be found in the care plan section) Acute Rehab PT Goals Patient Stated Goal: return home and care for herself PT  Goal Formulation: With patient Time For Goal Achievement: 10/25/14 Potential to Achieve Goals: Good Progress towards PT goals: Not progressing toward goals - comment    Frequency  Min 3X/week    PT Plan Current plan remains appropriate    Co-evaluation             End of Session Equipment Utilized During Treatment: Gait belt;Oxygen Activity Tolerance: Patient limited by fatigue Patient left: in chair;with call bell/phone within reach     Time: 0913-0947 PT Time Calculation (min) (ACUTE ONLY): 34 min  Charges:  $Gait Training: 8-22  mins $Therapeutic Activity: 8-22 mins                    G Codes:      [name] 2014/11/06, 10:00 AM   [name], PT, DPT Acute Rehabilitation Services Pager: [phone]

## 2014-10-19 NOTE — Progress Notes (Signed)
5 Days Post-Op Procedure(s) (LRB): CORONARY ARTERY BYPASS GRAFTING (CABG), ON PUMP, TIMES FOUR, USING LEFT INTERNAL MAMMARY ARTERY, RIGHT GREATER SAPHENOUS VEIN HARVESTED ENDOSCOPICALLY (N/A) Subjective: Says she felt short of breath overnight in bed and up in chair.  Objective: Vital signs in last 24 hours: Temp:  [98.5 F (36.9 C)-99.3 F (37.4 C)] 98.7 F (37.1 C) (05/18 0737) Pulse Rate:  [69-93] 83 (05/18 0700) Cardiac Rhythm:  [-] Normal sinus rhythm (05/18 0000) Resp:  [13-31] 13 (05/18 0700) BP: (106-162)/(44-60) 133/60 mmHg (05/18 0700) SpO2:  [92 %-99 %] 97 % (05/18 0700) Weight:  [93 kg (205 lb 0.4 oz)] 93 kg (205 lb 0.4 oz) (05/18 0500)  Hemodynamic parameters for last 24 hours:    Intake/Output from previous day: 05/17 0701 - 05/18 0700 In: 600 [P.O.:600] Out: 975 [Urine:975] Intake/Output this shift:    General appearance: cooperative and fatigued Heart: regular rate and rhythm, S1, S2 normal, no murmur, click, rub or gallop Lungs: diminished breath sounds bibasilar Extremities: edema mild Wound: incision ok  Lab Results:  Recent Labs  10/17/14 0420  WBC 12.3*  HGB 9.4*  HCT 27.7*  PLT 106*   BMET:  Recent Labs  10/17/14 0420  NA 131*  K 4.8  CL 103  CO2 25  GLUCOSE 124*  BUN 14  CREATININE 0.79  CALCIUM 7.6*    PT/INR: No results for input(s): LABPROT, INR in the last 72 hours. ABG    Component Value Date/Time   PHART 7.325* 10/15/2014 0427   HCO3 23.4 10/15/2014 0427   TCO2 19 10/15/2014 1730   ACIDBASEDEF 3.0* 10/15/2014 0427   O2SAT 93.0 10/15/2014 0427   CBG (last 3)   Recent Labs  10/18/14 1509 10/18/14 2147 10/19/14 0734  GLUCAP 137* 114* 107*    Assessment/Plan: S/P Procedure(s) (LRB): CORONARY ARTERY BYPASS GRAFTING (CABG), ON PUMP, TIMES FOUR, USING LEFT INTERNAL MAMMARY ARTERY, RIGHT GREATER SAPHENOUS VEIN HARVESTED ENDOSCOPICALLY (N/A)  Hemodynamically stable on Midodrine. Mobilize Diuresis Diabetes  control check CXR this am. Awaiting bed in stepdown.  LOS: 5 days    Gaye Pollack 10/19/2014

## 2014-10-20 LAB — BASIC METABOLIC PANEL
Anion gap: 7 (ref 5–15)
BUN: 16 mg/dL (ref 6–20)
CO2: 30 mmol/L (ref 22–32)
Calcium: 8.3 mg/dL — ABNORMAL LOW (ref 8.9–10.3)
Chloride: 100 mmol/L — ABNORMAL LOW (ref 101–111)
Creatinine, Ser: 0.96 mg/dL (ref 0.44–1.00)
GFR calc non Af Amer: 52 mL/min — ABNORMAL LOW (ref >60)
GLUCOSE: 98 mg/dL (ref 65–99)
POTASSIUM: 4.7 mmol/L (ref 3.5–5.1)
Sodium: 137 mmol/L (ref 135–145)

## 2014-10-20 LAB — GLUCOSE, CAPILLARY: Glucose-Capillary: 100 mg/dL — ABNORMAL HIGH (ref 65–99)

## 2014-10-20 MED ORDER — MIDODRINE HCL 5 MG PO TABS
5.0000 mg | ORAL_TABLET | Freq: Three times a day (TID) | ORAL | Status: DC
  Administered 2014-10-20 – 2014-10-21 (×4): 5 mg via ORAL
  Filled 2014-10-20 (×6): qty 1

## 2014-10-20 MED ORDER — FUROSEMIDE 10 MG/ML IJ SOLN
40.0000 mg | Freq: Once | INTRAMUSCULAR | Status: AC
  Administered 2014-10-20: 40 mg via INTRAVENOUS
  Filled 2014-10-20: qty 4

## 2014-10-20 MED ORDER — POTASSIUM CHLORIDE CRYS ER 20 MEQ PO TBCR
20.0000 meq | EXTENDED_RELEASE_TABLET | Freq: Every day | ORAL | Status: DC
Start: 2014-10-21 — End: 2014-10-21
  Filled 2014-10-20: qty 1

## 2014-10-20 NOTE — Clinical Social Work Placement (Addendum)
   CLINICAL SOCIAL WORK PLACEMENT  NOTE  Date:  10/20/2014  Patient Details  Name: Jenna Marshall MRN: [id number] Date of Birth: 1926-03-29  Clinical Social Work is seeking post-discharge placement for this patient at the [place] level of care (*CSW will initial, date and re-position this form in  chart as items are completed):  Yes   Patient/family provided with [facility] Work Department's list of facilities offering this level of care within the geographic area requested by the patient (or if unable, by the patient's family).  Yes   Patient/family informed of their freedom to choose among providers that offer the needed level of care, that participate in Medicare, Medicaid or managed care program needed by the patient, have an available bed and are willing to accept the patient.  Yes   Patient/family informed of [facility]'s ownership interest in [facility] and [facility], as well as of the fact that they are under no obligation to receive care at these facilities.  PASRR submitted to EDS on 10/19/14     PASRR number received on 10/19/14     Existing PASRR number confirmed on       FL2 transmitted to all facilities in geographic area requested by pt/family on       FL2 transmitted to all facilities within larger geographic area on       Patient informed that his/her managed care company has contracts with or will negotiate with certain facilities, including the following:        10/20/14  Patient/family informed of bed offers received.  Patient chooses bed at  [facility]     Physician recommends and patient chooses bed at      Patient to be transferred to  [facility] on  10/21/2014.  Patient to be transferred to facility by  [facility]     Patient family notified on  10/21/14 of transfer.  Name of family member notified:   Ciro Backer   PHYSICIAN   Additional Comment:     _______________________________________________ Jones Broom. [place], MSW, [place] 10/21/2014 2:13 PM

## 2014-10-20 NOTE — Progress Notes (Signed)
CARDIAC REHAB PHASE I   PRE:  Rate/Rhythm: 101 ST  BP:  Supine:   Sitting: 129/66  Standing:    SaO2: 92-93%RA  MODE:  Ambulation: 150 ft   POST:  Rate/Rhythm: 110 ST  BP:  Supine:   Sitting: 131/64  Standing:    SaO2: 93%RA 1355-1427 Pt walked 150 ft on RA with gait belt use, asst x 2 and rolling walker. One asst to follow with wheelchair but pt did not need to sit. Took a standing rest break. Tolerated well. To BSC prior to bed for pacing wire removal.   Graylon Good, RN BSN  10/20/2014 2:24 PM

## 2014-10-20 NOTE — Progress Notes (Signed)
10/20/2014 6:36 PM Pt ambulated 80ft with rolling walker on RA.  Tolerated well.  Checked O2 when returned to room, 89-90%, placed 2liters.  Pt made comfortable in bed, call bell in reach, family at bedside. [place]

## 2014-10-20 NOTE — Progress Notes (Addendum)
      [address]       ,[place] [address]             [phone]        6 Days Post-Op Procedure(s) (LRB): CORONARY ARTERY BYPASS GRAFTING (CABG), ON PUMP, TIMES FOUR, USING LEFT INTERNAL MAMMARY ARTERY, RIGHT GREATER SAPHENOUS VEIN HARVESTED ENDOSCOPICALLY (N/A)  Subjective: Patient sitting in chair. No specific complaints this am.  Objective: Vital signs in last 24 hours: Temp:  [97.6 F (36.4 C)-98.6 F (37 C)] 97.9 F (36.6 C) (05/19 0513) Pulse Rate:  [74-94] 94 (05/19 0740) Cardiac Rhythm:  [-] Sinus tachycardia (05/19 0740) Resp:  [8-27] 20 (05/19 0740) BP: (124-159)/(56-98) 133/56 mmHg (05/19 0740) SpO2:  [91 %-98 %] 95 % (05/19 0513) Weight:  [197 lb 1.6 oz (89.404 kg)] 197 lb 1.6 oz (89.404 kg) (05/19 0513)  Pre op weight 85.5 kg Current Weight  10/20/14 197 lb 1.6 oz (89.404 kg)       Intake/Output from previous day: 05/18 0701 - 05/19 0700 In: 20 [P.O.:580] Out: 2825 [Urine:2825]   Physical Exam:  Cardiovascular: Slightly tachy Pulmonary: Crackles at bases Abdomen: Soft, non tender, bowel sounds present. Extremities: Mild bilateral lower extremity edema. Wounds: Clean and dry.  No erythema or signs of infection.  Lab Results: CBC:No results for input(s): WBC, HGB, HCT, PLT in the last 72 hours. BMET:  Recent Labs  10/20/14 0450  NA 137  K 4.7  CL 100*  CO2 30  GLUCOSE 98  BUN 16  CREATININE 0.96  CALCIUM 8.3*    PT/INR:  Lab Results  Component Value Date   INR 1.64* 10/14/2014   ABG:  INR: Will add last result for INR, ABG once components are confirmed Will add last 4 CBG results once components are confirmed  Assessment/Plan:  1. CV - ST. SBP in the 130-140's. On Midodrine 10 mg tid. Will decrease to 5 mg tid. 2.  Pulmonary - On 2 liters of oxygen via [place]. Wean as tolerates. Encourage incentive spirometer and flutter valve. 3. CBGs 109/108/112. On Insulin. No pre op HGA1C and no prior history of diabetes.  Stop accu checks and SS. 4. Volume overload-will give Lasix 40 mg IV again as had good diuresis with this yesterday 5. Remove EPW 6. Will need SNF when ready for discharge   [name],DONIELLE MPA-C 10/20/2014,7:52 AM   Chart reviewed, patient examined, agree with above. She feels better today. I think she will require SNF at discharge until she gets stronger and can return home.

## 2014-10-20 NOTE — Progress Notes (Signed)
Medicare Important Message given? YES  (If response is "NO", the following Medicare IM given date fields will be blank)  Date Medicare IM given: 10/20/14 Medicare IM given by:  Dahlia Client [employer]

## 2014-10-20 NOTE — Clinical Social Work Note (Signed)
Clinical Social Work Assessment  Patient Details  Name: Jenna Marshall MRN: [id number] Date of Birth: 1925/12/20  Date of referral:  10/20/14               Reason for consult:  Facility Placement                Permission sought to share information with:  Family Supports Permission granted to share information::  Yes, Verbal Permission Granted  Name::        Agency::  SNF admissions  Relationship::  Ciro Backer Patient's daughter  Contact Information:     Housing/Transportation Living arrangements for the past 2 months:  Single Family Home Source of Information:  Patient, Adult Children Patient Interpreter Needed:  None Criminal Activity/Legal Involvement Pertinent to Current Situation/Hospitalization:  No - Comment as needed Significant Relationships:  Adult Children Lives with:  Self Do you feel safe going back to the place where you live?  Yes Need for family participation in patient care:  Yes (Comment) (Patient requests CSW to speak to her daughter about placement)  Care giving concerns:  Patient's family is concerned because patient lives on her own, and she will need some extra help initially.   Social Worker assessment / plan:  Patient is a pleasant 81 year old female who lives alone.  Patient is alert and orieted x4 and talkative.  Patient states her daughter has look at [facility] and would like her to go to [facility], [facility], or [facility].  Patient expresses that she has never been in rehab, but understands that it is for short term rehab only and the goal is to return back home.  CSW explained process of SNF placement to patient and her daughter, who called CSW on the phone.  CSW provided psychosocial support for patient and her family and explained that many people go to SNF for short term rehab, then return back home with home health.  Paitient and her family expressed they understand and did not have any other questions at this time.  CSW gave contact information to  patient and her daughter.  CSW to continue to follow patient for SNF discharge.  Employment status:  Retired Nurse, adult PT Recommendations:  [place] / Referral to community resources:  [place]  Patient/Family's Response to care:  Patient and family in agreement to going to SNF for short term rehab.  Patient/Family's Understanding of and Emotional Response to Diagnosis, Current Treatment, and Prognosis:  Patient and family understand that patient is not safe or strong enough to return home yet, but is willing to go to SNF.  Emotional Assessment Appearance:  Appears stated age Attitude/Demeanor/Rapport:    Affect (typically observed):  Accepting, Pleasant, Hopeful, Stable Orientation:  Oriented to Self, Oriented to Place, Oriented to Situation, Oriented to  Time Alcohol / Substance use:  Not Applicable Psych involvement (Current and /or in the community):  No (Comment)  Discharge Needs  Concerns to be addressed:    Readmission within the last 30 days:  No Current discharge risk:  Lives alone Barriers to Discharge:  No Barriers Identified   Anell Barr 10/20/2014, 3:46 PM

## 2014-10-20 NOTE — Progress Notes (Signed)
PT Cancellation Note  Patient Details Name: Jenna Marshall MRN: [id number] DOB: November 18, 1925   Cancelled Treatment:    Reason Eval/Treat Not Completed: Patient not medically ready (EPW pulled and on bedrest until 15:50).   New order received for PT evaluation. Spoke with pt's daughter, Jenna Marshall, and the family cannot care for pt in her current state. (Noted required 2 person assist this morning with Cardiac Rehab. Last PT session required max assist to stand and only walked 40 feet. That PT questioned need for SNF if family unable to care for pt at this level of care).   Agree pt will need further therapy to increase her independence and decrease the burden of care on her family members prior to return home. (see updated flowsheet)   [name] 10/20/2014, 3:00 PM  Pager [phone]

## 2014-10-20 NOTE — Progress Notes (Signed)
Utilization review completed.  

## 2014-10-20 NOTE — Progress Notes (Signed)
10/20/2014 1430 EPW D/C'd per order and per protocol.  Ends intact. Pt. Tolerated well.  Advised bedrest x1hr.  Call bell in reach.  Vital signs collected per protocol. [place]

## 2014-10-20 NOTE — Discharge Summary (Signed)
Physician Discharge Summary       [address]       [place] [address]             [phone]    Patient ID: Jenna Marshall MRN: [id number] DOB/AGE: [birth year] 81 y.o.  Admit date: 10/14/2014 Discharge date: 10/21/2014  Admission Diagnoses: 1. Multivessel CAD 2. History of hypertension 3. History of hyperlipidemia 4. History of GERD 5. History of hypothyroidism  Discharge Diagnoses:  1. Multivessel CAD 2. History of hypertension 3. History of hyperlipidemia 4. History of GERD 5. History of hypothyroidism 6. ABL anemia  Procedure (s):  1. Median Sternotomy 2. Extracorporeal circulation 3. Coronary artery bypass grafting x 4   Left internal mammary graft to the LAD  SVG to RCA  Sequential SVG to Ramus and OM  4. Endoscopic vein harvest from the right leg by Dr. Cyndia Bent on 10/15/2014.  History of Presenting Illness: The patient is [name] with hypertension and hyperlipidemia who was admitted to [facility] on Tuesday with a several week history of intermittent sub sternal chest pain and aching in the forearm muscles bilaterally that was occuring with exertion and at rest. She ruled out for MI. She had a high risk nuclear stress test and catheterization on 10/14/2014 showing 99% distal LM stenosis and significant RCA stenosis. An echo showed an LVEF of 55-60%. Nuclear stress showed an EF of 41%. She says she has had no chest pain since admission Tuesday.   She has a 99% distal LM stenosis and requires urgent CABG. She was transferred from [facility] this afternoon. Unfortunately, she has had bilateral lower extremity vein stripping in the distant past and may not have any usable saphenous vein. If there is no vein, then we will need to use both IMA's if they are long enough and could consider using the left radial artery if doppler exam shows that it is not the primary supply to her hand. I discussed the operative procedure with the  patient and son, daughter-in-law including alternatives, benefits and risks; including but not limited to bleeding, blood transfusion, infection, stroke, myocardial infarction, graft failure, heart block requiring a permanent pacemaker, organ dysfunction, and death. I also discussed the possible need to use the left radial artery if it is not the primary arterial supply to the left hand. Jenna Marshall understands and agrees to proceed. She underwent a CABG x 4 on 10/15/2014.  Brief Hospital Course:  The patient was extubated early the morning of post operative day one without difficulty. She was initially AAI paced. She remained afebrile and hemodynamically stable. Jenna Marshall, a line, chest tubes, and foley were removed early in the post operative course. She was not put on a beta blocker as she had labile BP and was bradycardic. She was started on Midodrine and this was decreased as tolerated. She was volume over loaded and diuresed. She had ABL anemia. She did not require a post op transfusion. Her last H and H was up to 9.4 and 27.7. She was weaned off the insulin drip. The patient's glucose remained well controlled. The patient's HGA1C pre op was 5.8.  She will need further surveillance of her HGA1C by her medical doctor after discharge. The patient was felt surgically stable for transfer from the ICU to [facility] for further convalescence on 10/19/2014. She continues to progress with cardiac rehab. She was ambulating on 2 liters of oxygen via [place]. We will attempt to wean her to room  air.  She has been tolerating a diet and has had a bowel movement. Epicardial pacing wires were removed prior to discharge. Chest tube sutures will be removed in the office.The patient is felt surgically stable for discharge to SNF today.  We ask the SNF to please do the following: 1. Please obtain vital signs at least one time daily 2.Please weigh the patient daily. If he or she continues to gain weight or develops lower extremity  edema, contact the office at [phone]. 3. Ambulate patient at least three times daily and please use sternal precautions.  Latest Vital Signs: Blood pressure 131/59, pulse 97, temperature 98.5 F (36.9 C), temperature source Oral, resp. rate 18, weight 193 lb 9.6 oz (87.816 kg), SpO2 90 %.  Physical Exam: Cardiovascular: Slightly tachy Pulmonary: Crackles at bases Abdomen: Soft, non tender, bowel sounds present. Extremities: Mild bilateral lower extremity edema. Wounds: Clean and dry. No erythema or signs of infection.  Discharge Condition:Stable and discharged to home  Recent laboratory studies:  Lab Results  Component Value Date   WBC 12.3* 10/17/2014   HGB 9.4* 10/17/2014   HCT 27.7* 10/17/2014   MCV 94.2 10/17/2014   PLT 106* 10/17/2014   Lab Results  Component Value Date   NA 137 10/20/2014   K 4.7 10/20/2014   CL 100* 10/20/2014   CO2 30 10/20/2014   CREATININE 0.96 10/20/2014   GLUCOSE 98 10/20/2014      Diagnostic Studies: Nm Myocar Multi W/spect W/wall Motion / Ef  10/13/2014    Pharmacological myocardial perfusion imaging study with moderate sized,  moderate in severity ischemia noted in the mid to apical anterior wall and  mid to apical lateral and inferolateral wall.  The left ventricular ejection fraction is moderately decreased (30-44%).  EF estimated at 41%  Defect 1: There is a medium defect of moderate severity present in the  basal inferolateral, mid anterior, mid inferolateral, mid anterolateral,  apical anterior and apex location.  T wave inversion was noted during rest and stress in the inferior leads  (aVF, II and III). No EKG changes concerning for ischemia.  This is a high risk study.    Dg Chest Port 1 View  10/19/2014   CLINICAL DATA:  Status post CABG surgery.  Follow-up exam.  EXAM: PORTABLE CHEST - 1 VIEW  COMPARISON:  10/16/2014.  FINDINGS: Increased opacity has developed at the lung bases consistent with enlarged pleural effusions.  There is associated atelectasis. This is greater on the left than the right. No convincing pulmonary edema. No pneumothorax.  Cardiac silhouette is mildly enlarged. No mediastinal widening. No mediastinal or hilar masses.  IMPRESSION: 1. Increased bilateral pleural effusions with associated atelectasis, left greater than right. No pulmonary edema.   Electronically Signed   By: Lajean Manes M.D.   On: 10/19/2014 09:38   Discharge Instructions    Amb Referral to Cardiac Rehabilitation    Complete by:  As directed   Congestive Heart Failure: If diagnosis is Heart Failure, patient MUST meet each of the CMS criteria: 1. Left Ventricular Ejection Fraction </= 35% 2. NYHA class II-IV symptoms despite being on optimal heart failure therapy for at least 6 weeks. 3. Stable = have not had a recent (<6 weeks) or planned (<6 months) major cardiovascular hospitalization or procedure  Program Details: - Physician supervised classes - 1-3 classes per week over a 12-18 week period, generally for a total of 36 sessions  Physician Certification: I certify that the above Cardiac Rehabilitation treatment  is medically necessary and is medically approved by me for treatment of this patient. The patient is willing and cooperative, able to ambulate and medically stable to participate in exercise rehabilitation. The participant's progress and Individualized Treatment Plan will be reviewed by the Medical Director, Cardiac Rehab staff and as indicated by the Referring/Ordering Physician.  Diagnosis:  CABG          Discharge Medications:   Medication List    STOP taking these medications        amLODipine 5 MG tablet  Commonly known as:  NORVASC     diclofenac 50 MG tablet  Commonly known as:  CATAFLAM     losartan 100 MG tablet  Commonly known as:  COZAAR      TAKE these medications        acetaminophen 325 MG tablet  Commonly known as:  TYLENOL  Take 2 tablets (650 mg total) by mouth every 6 (six)  hours as needed for mild pain.     aspirin 325 MG EC tablet  Take 1 tablet (325 mg total) by mouth daily.     Calcium Carb-Cholecalciferol 600-200 MG-UNIT Tabs  Take 1 tablet by mouth 2 (two) times daily.     cyanocobalamin 1000 MCG tablet  Take 100 mcg by mouth daily.     furosemide 40 MG tablet  Commonly known as:  LASIX  Take 1 tablet (40 mg total) by mouth daily.     levothyroxine 25 MCG tablet  Commonly known as:  SYNTHROID, LEVOTHROID  Take 25 mcg by mouth daily before breakfast.     midodrine 5 MG tablet  Commonly known as:  PROAMATINE  Take 1 tablet (5 mg total) by mouth 3 (three) times daily with meals.     multivitamin with minerals Tabs tablet  Take 1 tablet by mouth daily.     oxybutynin 5 MG tablet  Commonly known as:  DITROPAN  Take 1 tablet (5 mg total) by mouth 3 (three) times daily.     pantoprazole 40 MG tablet  Commonly known as:  PROTONIX  Take 40 mg by mouth daily.     potassium chloride 10 MEQ tablet  Commonly known as:  K-DUR  Take 1 tablet (10 mEq total) by mouth daily.     PRESERVISION AREDS PO  Take 1 tablet by mouth daily.     simvastatin 40 MG tablet  Commonly known as:  ZOCOR  Take 40 mg by mouth at bedtime.        The patient has been discharged on:   1.Beta Blocker:  Yes [  ]                              No   [  x ]                              If No, reason: Bradycardia, labile BP  2.Ace Inhibitor/ARB: Yes [   ]                                     No  [  x  ]  If No, reason: Preserve LVEF and labile BP  3.Statin:   Yes [  x ]                  No  [   ]                  If No, reason:  4.Ecasa:  Yes  [ x  ]                  No   [   ]                  If No, reason:  Follow Up Appointments: Follow-up Information    Follow up with Ida Rogue, MD On 11/02/2014.   Specialty:  Cardiology   Why:  Appointment time is at 10:45 am   Contact information:   [place] [address] [phone]       Follow up with Gaye Pollack, MD On 11/23/2014.   Specialty:  Cardiothoracic Surgery   Why:  PA/LAT CXR (to be taken at [facility] which is in the same building as Dr. Vivi Martens office) on 11/23/2014  at 1:30 pm ;Appointment time is at 2:30 pm   Contact information:   [address] [place] [address] [phone]       Follow up with Gaye Pollack, MD On 10/25/2014.   Specialty:  Cardiothoracic Surgery   Why:  Appointment time is at 10:30 am and is for chest tube suture removal only   Contact information:   [place] [address] [phone]       Signed: Lars Pinks MPA-C 10/21/2014, 11:46 AM

## 2014-10-20 NOTE — Clinical Social Work Note (Signed)
CSW contacted patient's daughter Ciro Backer [phone] to present bed offers.  Patient's daughter stated she would like [facility] as a first choice, however today they did not have any beds available, CSW will contact [facility] tomorrow.  Patient's daughter stated if patient can not get into [facility] she would like [facility] who responded and did give a bed offer.  CSW to continue following patient towards discharge.  Jones Broom. [place], MSW, [place] 10/20/2014 5:19 PM

## 2014-10-21 ENCOUNTER — Encounter
Admission: RE | Admit: 2014-10-21 | Discharge: 2014-10-21 | Disposition: A | Discharge status: Home / Self Care | Payer: Commercial Managed Care - HMO | Source: Ambulatory Visit | Attending: Internal Medicine | Admitting: Internal Medicine

## 2014-10-21 DIAGNOSIS — I2510 Atherosclerotic heart disease of native coronary artery without angina pectoris: Secondary | ICD-10-CM | POA: Diagnosis not present

## 2014-10-21 DIAGNOSIS — J189 Pneumonia, unspecified organism: Secondary | ICD-10-CM | POA: Diagnosis not present

## 2014-10-21 DIAGNOSIS — R0902 Hypoxemia: Secondary | ICD-10-CM | POA: Diagnosis not present

## 2014-10-21 DIAGNOSIS — Z951 Presence of aortocoronary bypass graft: Secondary | ICD-10-CM | POA: Diagnosis not present

## 2014-10-21 DIAGNOSIS — Z886 Allergy status to analgesic agent status: Secondary | ICD-10-CM | POA: Diagnosis not present

## 2014-10-21 DIAGNOSIS — Z8249 Family history of ischemic heart disease and other diseases of the circulatory system: Secondary | ICD-10-CM | POA: Diagnosis not present

## 2014-10-21 DIAGNOSIS — I517 Cardiomegaly: Secondary | ICD-10-CM | POA: Diagnosis not present

## 2014-10-21 DIAGNOSIS — Z48812 Encounter for surgical aftercare following surgery on the circulatory system: Secondary | ICD-10-CM | POA: Diagnosis not present

## 2014-10-21 DIAGNOSIS — H3530 Unspecified macular degeneration: Secondary | ICD-10-CM | POA: Diagnosis present

## 2014-10-21 DIAGNOSIS — J9811 Atelectasis: Secondary | ICD-10-CM | POA: Diagnosis not present

## 2014-10-21 DIAGNOSIS — R531 Weakness: Secondary | ICD-10-CM | POA: Diagnosis not present

## 2014-10-21 DIAGNOSIS — B965 Pseudomonas (aeruginosa) (mallei) (pseudomallei) as the cause of diseases classified elsewhere: Secondary | ICD-10-CM | POA: Diagnosis present

## 2014-10-21 DIAGNOSIS — Z79899 Other long term (current) drug therapy: Secondary | ICD-10-CM | POA: Diagnosis not present

## 2014-10-21 DIAGNOSIS — N390 Urinary tract infection, site not specified: Secondary | ICD-10-CM | POA: Diagnosis not present

## 2014-10-21 DIAGNOSIS — M1000 Idiopathic gout, unspecified site: Secondary | ICD-10-CM | POA: Diagnosis present

## 2014-10-21 DIAGNOSIS — F4320 Adjustment disorder, unspecified: Secondary | ICD-10-CM | POA: Diagnosis present

## 2014-10-21 DIAGNOSIS — B9620 Unspecified Escherichia coli [E. coli] as the cause of diseases classified elsewhere: Secondary | ICD-10-CM | POA: Diagnosis present

## 2014-10-21 DIAGNOSIS — J90 Pleural effusion, not elsewhere classified: Secondary | ICD-10-CM | POA: Diagnosis not present

## 2014-10-21 DIAGNOSIS — F329 Major depressive disorder, single episode, unspecified: Secondary | ICD-10-CM | POA: Diagnosis present

## 2014-10-21 DIAGNOSIS — J811 Chronic pulmonary edema: Secondary | ICD-10-CM | POA: Diagnosis not present

## 2014-10-21 DIAGNOSIS — K219 Gastro-esophageal reflux disease without esophagitis: Secondary | ICD-10-CM | POA: Diagnosis not present

## 2014-10-21 DIAGNOSIS — D72829 Elevated white blood cell count, unspecified: Secondary | ICD-10-CM | POA: Diagnosis not present

## 2014-10-21 DIAGNOSIS — J9601 Acute respiratory failure with hypoxia: Secondary | ICD-10-CM | POA: Diagnosis not present

## 2014-10-21 DIAGNOSIS — I10 Essential (primary) hypertension: Secondary | ICD-10-CM | POA: Diagnosis not present

## 2014-10-21 DIAGNOSIS — R319 Hematuria, unspecified: Secondary | ICD-10-CM | POA: Diagnosis not present

## 2014-10-21 DIAGNOSIS — E039 Hypothyroidism, unspecified: Secondary | ICD-10-CM | POA: Diagnosis not present

## 2014-10-21 DIAGNOSIS — N3000 Acute cystitis without hematuria: Secondary | ICD-10-CM | POA: Diagnosis not present

## 2014-10-21 DIAGNOSIS — I200 Unstable angina: Secondary | ICD-10-CM | POA: Diagnosis not present

## 2014-10-21 DIAGNOSIS — R079 Chest pain, unspecified: Secondary | ICD-10-CM | POA: Diagnosis not present

## 2014-10-21 DIAGNOSIS — I959 Hypotension, unspecified: Secondary | ICD-10-CM | POA: Diagnosis not present

## 2014-10-21 DIAGNOSIS — Z833 Family history of diabetes mellitus: Secondary | ICD-10-CM | POA: Diagnosis not present

## 2014-10-21 DIAGNOSIS — E785 Hyperlipidemia, unspecified: Secondary | ICD-10-CM | POA: Diagnosis not present

## 2014-10-21 DIAGNOSIS — I5033 Acute on chronic diastolic (congestive) heart failure: Secondary | ICD-10-CM | POA: Diagnosis not present

## 2014-10-21 DIAGNOSIS — M1990 Unspecified osteoarthritis, unspecified site: Secondary | ICD-10-CM | POA: Diagnosis present

## 2014-10-21 DIAGNOSIS — J9589 Other postprocedural complications and disorders of respiratory system, not elsewhere classified: Secondary | ICD-10-CM | POA: Diagnosis not present

## 2014-10-21 DIAGNOSIS — Y832 Surgical operation with anastomosis, bypass or graft as the cause of abnormal reaction of the patient, or of later complication, without mention of misadventure at the time of the procedure: Secondary | ICD-10-CM | POA: Diagnosis present

## 2014-10-21 MED ORDER — POTASSIUM CHLORIDE ER 10 MEQ PO TBCR: 10.0000 meq | EXTENDED_RELEASE_TABLET | Freq: Every day | ORAL | Status: DC

## 2014-10-21 MED ORDER — FUROSEMIDE 40 MG PO TABS: 40.0000 mg | ORAL_TABLET | Freq: Every day | ORAL | Status: DC

## 2014-10-21 MED ORDER — OXYBUTYNIN CHLORIDE 5 MG PO TABS: 5.0000 mg | ORAL_TABLET | Freq: Three times a day (TID) | ORAL | Status: DC

## 2014-10-21 MED ORDER — FUROSEMIDE 40 MG PO TABS
40.0000 mg | ORAL_TABLET | Freq: Every day | ORAL | Status: DC
  Administered 2014-10-21: 40 mg via ORAL
  Filled 2014-10-21: qty 1

## 2014-10-21 MED ORDER — ACETAMINOPHEN 325 MG PO TABS: 650.0000 mg | ORAL_TABLET | Freq: Four times a day (QID) | ORAL | Status: AC | PRN

## 2014-10-21 MED ORDER — ASPIRIN 325 MG PO TBEC: 325.0000 mg | DELAYED_RELEASE_TABLET | Freq: Every day | ORAL | Status: DC

## 2014-10-21 MED ORDER — MIDODRINE HCL 5 MG PO TABS: 5.0000 mg | ORAL_TABLET | Freq: Three times a day (TID) | ORAL | Status: DC

## 2014-10-21 NOTE — Progress Notes (Signed)
CARDIAC REHAB PHASE I   PRE:  Rate/Rhythm: 95 SR  BP:  Supine:   Sitting: 114/40  Standing:    SaO2: 92%RA  MODE:  Ambulation: 190 ft   POST:  Rate/Rhythm: 112 ST  BP:  Supine:   Sitting: 111/44  Standing:    SaO2: 88-89% RA hall and room   To 90-91% with deep breaths 0820-0850 Pt assisted to [facility] and then walked 190 ft with gait belt use, rollator and asst x 2. Sat once to rest. Desat on RA but recovers quickly with deep breaths. To recliner with call bell. Will ed later if SNF bed available. Has some difficulty standing, needs much assistance.   Graylon Good, RN BSN  10/21/2014 8:46 AM

## 2014-10-21 NOTE — Clinical Social Work Note (Signed)
Patient to be d/c'ed today to [facility].  Patient and family agreeable to plans will transport via ems RN to call report.  Patient's family very appreciative of care provided and SNF arrangements.  Evette Cristal, MSW, [place]

## 2014-10-21 NOTE — Progress Notes (Signed)
Physical Therapy Treatment Patient Details Name: Jenna Marshall MRN: [id number] DOB: 04-12-1926 Today's Date: 10/21/2014    History of Present Illness Adm 10/14/14 for CABG x 4 due to severe CAD found on cardiac cath  PMHx-HTN, HLD, CAD    PT Comments    Pt was able to perform transfers with +2 assist to achieve full standing, and ambulation with min guard assist for safety. Continue to feel that this patient is appropriate for follow-up therapy at the SNF level, and pt reports she has provided her 2 top choices to CSW for bed search.   Follow Up Recommendations  SNF;Supervision/Assistance - 24 hour     Equipment Recommendations  None recommended by PT    Recommendations for Other Services OT consult     Precautions / Restrictions Precautions Precautions: Fall;Sternal Restrictions Weight Bearing Restrictions: Yes (Sternal precautions)    Mobility  Bed Mobility               General bed mobility comments: Pt sitting up in recliner chair upon PT arrival.   Transfers Overall transfer level: Needs assistance Equipment used: Rolling walker (2 wheeled) Transfers: Sit to/from Stand Sit to Stand: Mod assist;+2 physical assistance         General transfer comment: Multiple attempts to power-up to full standing position. +2 was required with support given at the elbows (to maintain sternal precautions) as well as on gait belt. Pt requires cueing for proper hand placement and safety awareness. Was not able to stand while holding the heart pillow. Pt was successful with placing hands on knees for more support. Pt required less support to stand from the higher bedside commode  Ambulation/Gait Ambulation/Gait assistance: Min guard Ambulation Distance (Feet): 150 Feet Assistive device: Rolling walker (2 wheeled) Gait Pattern/deviations: Step-through pattern;Decreased stride length;Trunk flexed Gait velocity: Decreased Gait velocity interpretation: Below normal speed for  age/gender General Gait Details: Pt was able to ambulate without seated or standing rest breaks. Was able to manage with the RW fairly well.    Stairs            Wheelchair Mobility    Modified Rankin (Stroke Patients Only)       Balance Overall balance assessment: Needs assistance Sitting-balance support: Feet supported;No upper extremity supported Sitting balance-Leahy Scale: Fair     Standing balance support: Bilateral upper extremity supported Standing balance-Leahy Scale: Poor                      Cognition Arousal/Alertness: Awake/alert Behavior During Therapy: WFL for tasks assessed/performed Overall Cognitive Status: No family/caregiver present to determine baseline cognitive functioning       Memory: Decreased short-term memory;Decreased recall of precautions              Exercises      General Comments        Pertinent Vitals/Pain Pain Assessment: No/denies pain    Home Living Family/patient expects to be discharged to:: Skilled nursing facility                    Prior Function Level of Independence: Independent with assistive device(s)      Comments: uses cane all the time due to unsteadiness   PT Goals (current goals can now be found in the care plan section) Acute Rehab PT Goals Patient Stated Goal: return home and care for herself PT Goal Formulation: With patient Time For Goal Achievement: 10/25/14 Potential to Achieve Goals: Good Progress towards PT goals:  Progressing toward goals    Frequency  Min 3X/week    PT Plan Current plan remains appropriate    Co-evaluation PT/OT/SLP Co-Evaluation/Treatment: Yes Reason for Co-Treatment: For patient/therapist safety PT goals addressed during session: Mobility/safety with mobility;Balance;Proper use of DME OT goals addressed during session: ADL's and self-care     End of Session Equipment Utilized During Treatment: Gait belt Activity Tolerance: Patient tolerated  treatment well Patient left: in chair;with call bell/phone within reach     Time: 0941-1000 PT Time Calculation (min) (ACUTE ONLY): 19 min  Charges:  $Gait Training: 8-22 mins                    G Codes:      [name] 11-07-14, 10:30 AM   [name], PT, DPT Acute Rehabilitation Services Pager: [phone]

## 2014-10-21 NOTE — Progress Notes (Addendum)
      [address]       [place] [address]             [id number]        7 Days Post-Op Procedure(s) (LRB): CORONARY ARTERY BYPASS GRAFTING (CABG), ON PUMP, TIMES FOUR, USING LEFT INTERNAL MAMMARY ARTERY, RIGHT GREATER SAPHENOUS VEIN HARVESTED ENDOSCOPICALLY (N/A)  Subjective: Patient states she feels better. Hopes to go to a facility...maybe today.  Objective: Vital signs in last 24 hours: Temp:  [98.5 F (36.9 C)-99.6 F (37.6 C)] 98.5 F (36.9 C) (05/20 0440) Pulse Rate:  [94-97] 97 (05/20 0440) Cardiac Rhythm:  [-] Normal sinus rhythm (05/19 2000) Resp:  [18-20] 18 (05/20 0440) BP: (126-145)/(51-68) 131/59 mmHg (05/20 0440) SpO2:  [90 %-95 %] 90 % (05/20 0440) Weight:  [193 lb 9.6 oz (87.816 kg)] 193 lb 9.6 oz (87.816 kg) (05/20 0440)  Pre op weight 85.5 kg Current Weight  10/21/14 193 lb 9.6 oz (87.816 kg)       Intake/Output from previous day: 05/19 0701 - 05/20 0700 In: 363 [P.O.:360; I.V.:3] Out: 2240 [Urine:2240]   Physical Exam:  Cardiovascular: RRR Pulmonary: Slightly decreased at bases Abdomen: Soft, non tender, bowel sounds present. Extremities: Mild bilateral lower extremity edema. Wounds: Clean and dry.  No erythema or signs of infection.  Lab Results: CBC:No results for input(s): WBC, HGB, HCT, PLT in the last 72 hours. BMET:   Recent Labs  10/20/14 0450  NA 137  K 4.7  CL 100*  CO2 30  GLUCOSE 98  BUN 16  CREATININE 0.96  CALCIUM 8.3*    PT/INR:  Lab Results  Component Value Date   INR 1.64* 10/14/2014   ABG:  INR: Will add last result for INR, ABG once components are confirmed Will add last 4 CBG results once components are confirmed  Assessment/Plan:  1. CV - SR. SBPremains in the 130-140's. On Midodrine  5 mg tid. 2.  Pulmonary - On room air. Encourage incentive spirometer and flutter valve. 3. Volume overload-has had good diuresis with IV Lasix 40 mg. Will change to oral daily. 4. Will need SNF  when ready for discharge-will discuss with Dr. Cyndia Bent whether later today vs Monday   [name],DONIELLE MPA-C 10/21/2014,7:39 AM    Chart reviewed, patient examined, agree with above. Her weight is 4 lbs below preop. Switch to oral lasix. I think she can go to SNF when bed available.

## 2014-10-21 NOTE — Progress Notes (Signed)
CARDIAC REHAB PHASE I   Pt daughter at bedside. [facility] discharge education completed. Reviewed IS, sternal precautions, activity progression, exercise, risk factors, heart healthy diet, phase 2 cardiac rehab. Pt and daughter verbalized understanding. Pt assisted to bedside commode. Pt states she feels constipated. Pt daughter states pt has not been eating and this is a "serious concern." RN notified. Pt watching cardiac surgery discharge video, in recliner, call bell within reach. Pt agrees to phase 2 cardiac rehab. Will send referral to [facility].    [id number]    Lenna Sciara, RN, BSN 10/21/2014 11:30 AM

## 2014-10-21 NOTE — Consult Note (Signed)
   [facility] CM Inpatient Consult   10/21/2014  Jenna Marshall December 26, 1925 [id number] Patient evaluated for community based chronic disease management services with [facility] as a benefit of patient's Medicare Insurance. Spoke with patient at bedside to explain [facility] services.  Patient will receive post discharge transition of care call and will be evaluated for monthly home visits for assessments and disease process education. Consent form signed.  Left contact information and [facility] literature at bedside. Encouraged patient to share information with her daughter, Jenna Marshall.  Patient is to discharge to skilled rehab at [facility],  and be followed for transition needs for return back home.  Made Inpatient Case Manager aware that [facility] following. Of note, [facility] Care Management services does not replace or interfere with any services that are arranged by inpatient case management or social work.  For additional questions or referrals please contact:   Natividad Brood, RN BSN [facility] Liaison  [phone] business mobile phone

## 2014-10-21 NOTE — Discharge Instructions (Signed)
We ask the SNF to please do the following: 1. Please obtain vital signs at least one time daily 2.Please weigh the patient daily. If he or she continues to gain weight or develops lower extremity edema, contact the office at [phone]. 3. Ambulate patient at least three times daily and please use sternal precautions.   Activity: 1.May walk up steps                2.No lifting more than ten pounds for four weeks.                 3.No driving for four weeks.                4.Stop any activity that causes chest pain, shortness of breath, dizziness, sweating or excessive weakness.                5.Avoid straining.                6.Continue with your breathing exercises daily.  Diet: Diabetic diet and Low fat, Low salt diet  Wound Care: May shower.  Clean wounds with mild soap and water daily. Contact the office at [phone] if any problems arise.  Coronary Artery Bypass Grafting, Care After Refer to this sheet in the next few weeks. These instructions provide you with information on caring for yourself after your procedure. Your health care provider may also give you more specific instructions. Your treatment has been planned according to current medical practices, but problems sometimes occur. Call your health care provider if you have any problems or questions after your procedure. WHAT TO EXPECT AFTER THE PROCEDURE Recovery from surgery will be different for everyone. Some people feel well after 3 or 4 weeks, while for others it takes longer. After your procedure, it is typical to have the following:  Nausea and a lack of appetite.   Constipation.  Weakness and fatigue.   Depression or irritability.   Pain or discomfort at your incision site. HOME CARE INSTRUCTIONS  Take medicines only as directed by your health care provider. Do not stop taking medicines or start any new medicines without first checking with your health care provider.  Take your pulse as directed by your  health care provider.  Perform deep breathing as directed by your health care provider. If you were given a device called an incentive spirometer, use it to practice deep breathing several times a day. Support your chest with a pillow or your arms when you take deep breaths or cough.  Keep incision areas clean, dry, and protected. Remove or change any bandages (dressings) only as directed by your health care provider. You may have skin adhesive strips over the incision areas. Do not take the strips off. They will fall off on their own.  Check incision areas daily for any swelling, redness, or drainage.  If incisions were made in your legs, do the following:  Avoid crossing your legs.   Avoid sitting for long periods of time. Change positions every 30 minutes.   Elevate your legs when you are sitting.  Wear compression stockings as directed by your health care provider. These stockings help keep blood clots from forming in your legs.  Take showers once your health care provider approves. Until then, only take sponge baths. Pat incisions dry. Do not rub incisions with a washcloth or towel. Do not take baths, swim, or use a hot tub until your health care provider approves.  Eat foods that  are high in fiber, such as raw fruits and vegetables, whole grains, beans, and nuts. Meats should be lean cut. Avoid canned, processed, and fried foods.  Drink enough fluid to keep your urine clear or pale yellow.  Weigh yourself every day. This helps identify if you are retaining fluid that may make your heart and lungs work harder.  Rest and limit activity as directed by your health care provider. You may be instructed to:  Stop any activity at once if you have chest pain, shortness of breath, irregular heartbeats, or dizziness. Get help right away if you have any of these symptoms.  Move around frequently for short periods or take short walks as directed by your health care provider. Increase your  activities gradually. You may need physical therapy or cardiac rehabilitation to help strengthen your muscles and build your endurance.  Avoid lifting, pushing, or pulling anything heavier than 10 lb (4.5 kg) for at least 6 weeks after surgery.  Do not drive until your health care provider approves.  Ask your health care provider when you may return to work.  Ask your health care provider when you may resume sexual activity.  Keep all follow-up visits as directed by your health care provider. This is important. SEEK MEDICAL CARE IF:  You have swelling, redness, increasing pain, or drainage at the site of an incision.  You have a fever.  You have swelling in your ankles or legs.  You have pain in your legs.   You gain 2 or more pounds (0.9 kg) a day.  You are nauseous or vomit.  You have diarrhea. SEEK IMMEDIATE MEDICAL CARE IF:  You have chest pain that goes to your jaw or arms.  You have shortness of breath.   You have a fast or irregular heartbeat.   You notice a "clicking" in your breastbone (sternum) when you move.   You have numbness or weakness in your arms or legs.  You feel dizzy or light-headed.  MAKE SURE YOU:  Understand these instructions.  Will watch your condition.  Will get help right away if you are not doing well or get worse. Document Released: 12/07/2004 Document Revised: 10/04/2013 Document Reviewed: 10/27/2012 [facility] Patient Information 2015 [place], [place]. This information is not intended to replace advice given to you by your health care provider. Make sure you discuss any questions you have with your health care provider.

## 2014-10-21 NOTE — Evaluation (Signed)
Occupational Therapy Evaluation Patient Details Name: Jenna Marshall MRN: [id number] DOB: 1926/03/31 Today's Date: 10/21/2014    History of Present Illness Adm 10/14/14 for CABG x 4 due to severe CAD found on cardiac cath  PMHx-HTN, HLD, CAD   Clinical Impression   PTA pt lived at home and was independent with use of SPC. Pt currently limited by generalized weakness and sternal precautions and requires Mod A +2 to stand safely. Pt requires assist for LB ADLs and would benefit from SNF for ST rehab to return to Supervision level prior to returning home. All further OT needs will be met at next venue of care.     Follow Up Recommendations  SNF;Supervision/Assistance - 24 hour    Equipment Recommendations  Other (comment) (defer to SNF)    Recommendations for Other Services       Precautions / Restrictions Precautions Precautions: Fall;Sternal Restrictions Weight Bearing Restrictions: Yes (Sternal precautions)      Mobility Bed Mobility               General bed mobility comments: Pt sitting up in recliner chair upon PT arrival.   Transfers Overall transfer level: Needs assistance Equipment used: Rolling walker (2 wheeled) Transfers: Sit to/from Stand Sit to Stand: Mod assist;+2 physical assistance         General transfer comment: Multiple attempts to power-up to full standing position. +2 was required with support given at the elbows (to maintain sternal precautions) as well as on gait belt. Pt requires cueing for proper hand placement and safety awareness. Was not able to stand while holding the heart pillow. Pt was successful with placing hands on knees for more support. Pt required less support to stand from the higher bedside commode    Balance Overall balance assessment: Needs assistance Sitting-balance support: Feet supported;No upper extremity supported Sitting balance-Leahy Scale: Fair     Standing balance support: Bilateral upper extremity  supported Standing balance-Leahy Scale: Poor                              ADL Overall ADL's : Needs assistance/impaired Eating/Feeding: Independent;Sitting   Grooming: Sitting;Set up   Upper Body Bathing: Set up;Sitting   Lower Body Bathing: Moderate assistance;+2 for physical assistance;Sit to/from stand   Upper Body Dressing : Set up;Sitting   Lower Body Dressing: Maximal assistance;+2 for physical assistance;Sit to/from stand   Toilet Transfer: Minimal assistance;+2 for safety/equipment;Ambulation;RW;BSC   Toileting- Clothing Manipulation and Hygiene: Moderate assistance;+2 for physical assistance;Sit to/from stand Toileting - Clothing Manipulation Details (indicate cue type and reason): Mod A to stand, pt able to perform pericare in standing with min A for balance     Functional mobility during ADLs: Minimal assistance;Rolling walker General ADL Comments: Pt is limited in her ability to sit<>stand due to sternal precautions and generalized weakness.      Vision Additional Comments: No change from baseline          Pertinent Vitals/Pain Pain Assessment: No/denies pain     Hand Dominance Right   Extremity/Trunk Assessment Upper Extremity Assessment Upper Extremity Assessment: Generalized weakness (limited by sternal precautions)   Lower Extremity Assessment Lower Extremity Assessment: Defer to PT evaluation   Cervical / Trunk Assessment Cervical / Trunk Assessment: Kyphotic   Communication Communication Communication: HOH   Cognition Arousal/Alertness: Awake/alert Behavior During Therapy: WFL for tasks assessed/performed Overall Cognitive Status: No family/caregiver present to determine baseline cognitive functioning  Memory: Decreased short-term memory;Decreased recall of precautions                        Home Living Family/patient expects to be discharged to:: Skilled nursing facility                                         Prior Functioning/Environment Level of Independence: Independent with assistive device(s)        Comments: uses cane all the time due to unsteadiness    OT Diagnosis: Generalized weakness;Acute pain                       Co-evaluation PT/OT/SLP Co-Evaluation/Treatment: Yes Reason for Co-Treatment: For patient/therapist safety PT goals addressed during session: Mobility/safety with mobility;Balance;Proper use of DME OT goals addressed during session: ADL's and self-care      End of Session Equipment Utilized During Treatment: Gait belt;Rolling walker;Other (comment) (sternal pillow)  Activity Tolerance: Patient tolerated treatment well Patient left: in chair;with call bell/phone within reach   Time: 0941-1000 OT Time Calculation (min): 19 min Charges:  OT General Charges $OT Visit: 1 Procedure OT Evaluation $Initial OT Evaluation Tier I: 1 Procedure G-Codes:    Juluis Rainier 2014-11-07, 10:43 AM  Cyndie Chime, OTR/L Occupational Therapist [phone] (pager)

## 2014-10-21 NOTE — Progress Notes (Signed)
Pt discharged via EMS Discharge instructions given to transporters  IV dc'd  Tele dc'd  Report called to [facility] Pt discharged via stretcher Patient belongings taken by daughter. Remainder of belongings at side.   Sherrie Mustache 3:33 PM

## 2014-10-22 ENCOUNTER — Encounter: Payer: Self-pay | Admitting: Emergency Medicine

## 2014-10-22 ENCOUNTER — Inpatient Hospital Stay
Admission: EM | Admit: 2014-10-22 | Discharge: 2014-10-26 | DRG: 689 | Disposition: A | Discharge status: Skilled Nursing Facility | Payer: Commercial Managed Care - HMO | Attending: Internal Medicine | Admitting: Internal Medicine

## 2014-10-22 ENCOUNTER — Emergency Department: Payer: Commercial Managed Care - HMO

## 2014-10-22 DIAGNOSIS — I2510 Atherosclerotic heart disease of native coronary artery without angina pectoris: Secondary | ICD-10-CM | POA: Diagnosis not present

## 2014-10-22 DIAGNOSIS — I517 Cardiomegaly: Secondary | ICD-10-CM | POA: Diagnosis not present

## 2014-10-22 DIAGNOSIS — F4321 Adjustment disorder with depressed mood: Secondary | ICD-10-CM | POA: Diagnosis not present

## 2014-10-22 DIAGNOSIS — B9620 Unspecified Escherichia coli [E. coli] as the cause of diseases classified elsewhere: Secondary | ICD-10-CM | POA: Diagnosis not present

## 2014-10-22 DIAGNOSIS — M19041 Primary osteoarthritis, right hand: Secondary | ICD-10-CM | POA: Diagnosis not present

## 2014-10-22 DIAGNOSIS — I10 Essential (primary) hypertension: Secondary | ICD-10-CM | POA: Diagnosis present

## 2014-10-22 DIAGNOSIS — F4320 Adjustment disorder, unspecified: Secondary | ICD-10-CM | POA: Diagnosis present

## 2014-10-22 DIAGNOSIS — I5033 Acute on chronic diastolic (congestive) heart failure: Secondary | ICD-10-CM | POA: Diagnosis not present

## 2014-10-22 DIAGNOSIS — N3000 Acute cystitis without hematuria: Secondary | ICD-10-CM | POA: Diagnosis not present

## 2014-10-22 DIAGNOSIS — E039 Hypothyroidism, unspecified: Secondary | ICD-10-CM | POA: Diagnosis not present

## 2014-10-22 DIAGNOSIS — K219 Gastro-esophageal reflux disease without esophagitis: Secondary | ICD-10-CM | POA: Diagnosis not present

## 2014-10-22 DIAGNOSIS — I959 Hypotension, unspecified: Secondary | ICD-10-CM | POA: Diagnosis not present

## 2014-10-22 DIAGNOSIS — J189 Pneumonia, unspecified organism: Secondary | ICD-10-CM | POA: Diagnosis present

## 2014-10-22 DIAGNOSIS — Z9981 Dependence on supplemental oxygen: Secondary | ICD-10-CM | POA: Diagnosis not present

## 2014-10-22 DIAGNOSIS — R0902 Hypoxemia: Secondary | ICD-10-CM | POA: Insufficient documentation

## 2014-10-22 DIAGNOSIS — Z886 Allergy status to analgesic agent status: Secondary | ICD-10-CM | POA: Diagnosis not present

## 2014-10-22 DIAGNOSIS — F329 Major depressive disorder, single episode, unspecified: Secondary | ICD-10-CM | POA: Diagnosis present

## 2014-10-22 DIAGNOSIS — R319 Hematuria, unspecified: Secondary | ICD-10-CM

## 2014-10-22 DIAGNOSIS — H3530 Unspecified macular degeneration: Secondary | ICD-10-CM | POA: Diagnosis present

## 2014-10-22 DIAGNOSIS — M25571 Pain in right ankle and joints of right foot: Secondary | ICD-10-CM

## 2014-10-22 DIAGNOSIS — J9601 Acute respiratory failure with hypoxia: Secondary | ICD-10-CM | POA: Diagnosis present

## 2014-10-22 DIAGNOSIS — M1990 Unspecified osteoarthritis, unspecified site: Secondary | ICD-10-CM | POA: Diagnosis present

## 2014-10-22 DIAGNOSIS — Z79899 Other long term (current) drug therapy: Secondary | ICD-10-CM

## 2014-10-22 DIAGNOSIS — N390 Urinary tract infection, site not specified: Secondary | ICD-10-CM | POA: Diagnosis not present

## 2014-10-22 DIAGNOSIS — J811 Chronic pulmonary edema: Secondary | ICD-10-CM | POA: Diagnosis not present

## 2014-10-22 DIAGNOSIS — J90 Pleural effusion, not elsewhere classified: Secondary | ICD-10-CM | POA: Diagnosis not present

## 2014-10-22 DIAGNOSIS — Z951 Presence of aortocoronary bypass graft: Secondary | ICD-10-CM

## 2014-10-22 DIAGNOSIS — R531 Weakness: Secondary | ICD-10-CM | POA: Diagnosis not present

## 2014-10-22 DIAGNOSIS — M6281 Muscle weakness (generalized): Secondary | ICD-10-CM | POA: Diagnosis not present

## 2014-10-22 DIAGNOSIS — M79644 Pain in right finger(s): Secondary | ICD-10-CM

## 2014-10-22 DIAGNOSIS — J9589 Other postprocedural complications and disorders of respiratory system, not elsewhere classified: Secondary | ICD-10-CM | POA: Diagnosis not present

## 2014-10-22 DIAGNOSIS — M1000 Idiopathic gout, unspecified site: Secondary | ICD-10-CM | POA: Diagnosis present

## 2014-10-22 DIAGNOSIS — Z48812 Encounter for surgical aftercare following surgery on the circulatory system: Secondary | ICD-10-CM | POA: Diagnosis not present

## 2014-10-22 DIAGNOSIS — M79674 Pain in right toe(s): Secondary | ICD-10-CM | POA: Diagnosis not present

## 2014-10-22 DIAGNOSIS — I200 Unstable angina: Secondary | ICD-10-CM | POA: Diagnosis not present

## 2014-10-22 DIAGNOSIS — Z8249 Family history of ischemic heart disease and other diseases of the circulatory system: Secondary | ICD-10-CM | POA: Diagnosis not present

## 2014-10-22 DIAGNOSIS — Y832 Surgical operation with anastomosis, bypass or graft as the cause of abnormal reaction of the patient, or of later complication, without mention of misadventure at the time of the procedure: Secondary | ICD-10-CM | POA: Diagnosis present

## 2014-10-22 DIAGNOSIS — E785 Hyperlipidemia, unspecified: Secondary | ICD-10-CM | POA: Diagnosis present

## 2014-10-22 DIAGNOSIS — D72829 Elevated white blood cell count, unspecified: Secondary | ICD-10-CM | POA: Diagnosis not present

## 2014-10-22 DIAGNOSIS — J9811 Atelectasis: Secondary | ICD-10-CM | POA: Diagnosis not present

## 2014-10-22 DIAGNOSIS — M10471 Other secondary gout, right ankle and foot: Secondary | ICD-10-CM | POA: Diagnosis not present

## 2014-10-22 DIAGNOSIS — Z833 Family history of diabetes mellitus: Secondary | ICD-10-CM

## 2014-10-22 DIAGNOSIS — B965 Pseudomonas (aeruginosa) (mallei) (pseudomallei) as the cause of diseases classified elsewhere: Secondary | ICD-10-CM | POA: Diagnosis present

## 2014-10-22 LAB — CBC
HEMATOCRIT: 30.9 % — AB (ref 35.0–47.0)
HEMOGLOBIN: 10.3 g/dL — AB (ref 12.0–16.0)
MCH: 31.8 pg (ref 26.0–34.0)
MCHC: 33.3 g/dL (ref 32.0–36.0)
MCV: 95.6 fL (ref 80.0–100.0)
PLATELETS: 285 10*3/uL (ref 150–440)
RBC: 3.24 MIL/uL — ABNORMAL LOW (ref 3.80–5.20)
RDW: 14.3 % (ref 11.5–14.5)
WBC: 14.5 10*3/uL — AB (ref 3.6–11.0)

## 2014-10-22 LAB — URINALYSIS COMPLETE WITH MICROSCOPIC (ARMC ONLY)
BACTERIA UA: NONE SEEN
Bilirubin Urine: NEGATIVE
Glucose, UA: NEGATIVE mg/dL
Ketones, ur: NEGATIVE mg/dL
Nitrite: POSITIVE — AB
PROTEIN: 30 mg/dL — AB
SPECIFIC GRAVITY, URINE: 1.006 (ref 1.005–1.030)
pH: 7 (ref 5.0–8.0)

## 2014-10-22 LAB — BASIC METABOLIC PANEL
ANION GAP: 10 (ref 5–15)
BUN: 22 mg/dL — ABNORMAL HIGH (ref 6–20)
CALCIUM: 8.4 mg/dL — AB (ref 8.9–10.3)
CO2: 28 mmol/L (ref 22–32)
Chloride: 97 mmol/L — ABNORMAL LOW (ref 101–111)
Creatinine, Ser: 1.05 mg/dL — ABNORMAL HIGH (ref 0.44–1.00)
GFR calc Af Amer: 54 mL/min — ABNORMAL LOW (ref >60)
GFR calc non Af Amer: 47 mL/min — ABNORMAL LOW (ref >60)
Glucose, Bld: 131 mg/dL — ABNORMAL HIGH (ref 65–99)
Potassium: 4.2 mmol/L (ref 3.5–5.1)
Sodium: 135 mmol/L (ref 135–145)

## 2014-10-22 LAB — TROPONIN I: TROPONIN I: 0.06 ng/mL — AB (ref <0.031)

## 2014-10-22 LAB — URIC ACID: URIC ACID, SERUM: 5.2 mg/dL (ref 2.3–6.6)

## 2014-10-22 MED ORDER — OXYBUTYNIN CHLORIDE 5 MG PO TABS
5.0000 mg | ORAL_TABLET | Freq: Three times a day (TID) | ORAL | Status: DC
  Administered 2014-10-22 – 2014-10-26 (×13): 5 mg via ORAL
  Filled 2014-10-22 (×13): qty 1

## 2014-10-22 MED ORDER — CEFTRIAXONE SODIUM 1 G IJ SOLR
INTRAMUSCULAR | Status: AC
  Filled 2014-10-22: qty 10

## 2014-10-22 MED ORDER — ONDANSETRON HCL 4 MG PO TABS: 4.0000 mg | ORAL_TABLET | Freq: Four times a day (QID) | ORAL | Status: DC | PRN

## 2014-10-22 MED ORDER — PANTOPRAZOLE SODIUM 40 MG PO TBEC
40.0000 mg | DELAYED_RELEASE_TABLET | Freq: Every day | ORAL | Status: DC
  Administered 2014-10-22 – 2014-10-26 (×5): 40 mg via ORAL
  Filled 2014-10-22 (×5): qty 1

## 2014-10-22 MED ORDER — OCUVITE-LUTEIN PO CAPS
1.0000 | ORAL_CAPSULE | Freq: Every day | ORAL | Status: DC
  Filled 2014-10-22 (×2): qty 1

## 2014-10-22 MED ORDER — CALCIUM CARBONATE-VITAMIN D 500-200 MG-UNIT PO TABS
1.0000 | ORAL_TABLET | Freq: Two times a day (BID) | ORAL | Status: DC
Start: 2014-10-22 — End: 2014-10-26
  Administered 2014-10-22 – 2014-10-26 (×8): 1 via ORAL
  Filled 2014-10-22 (×16): qty 1

## 2014-10-22 MED ORDER — ACETAMINOPHEN 325 MG PO TABS: 650.0000 mg | ORAL_TABLET | Freq: Four times a day (QID) | ORAL | Status: DC | PRN

## 2014-10-22 MED ORDER — ONE-DAILY MULTI VITAMINS PO TABS
1.0000 | ORAL_TABLET | Freq: Every day | ORAL | Status: DC
  Administered 2014-10-23 – 2014-10-26 (×4): 1 via ORAL
  Filled 2014-10-22 (×6): qty 1

## 2014-10-22 MED ORDER — DEXTROSE 5 % IV SOLN
1.0000 g | INTRAVENOUS | Status: DC
  Administered 2014-10-23 – 2014-10-25 (×3): 1 g via INTRAVENOUS
  Filled 2014-10-22 (×5): qty 10

## 2014-10-22 MED ORDER — DIPHENHYDRAMINE HCL 25 MG PO CAPS
25.0000 mg | ORAL_CAPSULE | Freq: Once | ORAL | Status: AC
  Administered 2014-10-22: 25 mg via ORAL
  Filled 2014-10-22: qty 1

## 2014-10-22 MED ORDER — ONDANSETRON HCL 4 MG/2ML IJ SOLN: 4.0000 mg | Freq: Four times a day (QID) | INTRAMUSCULAR | Status: DC | PRN

## 2014-10-22 MED ORDER — POTASSIUM CHLORIDE ER 10 MEQ PO CPCR
10.0000 meq | ORAL_CAPSULE | Freq: Every day | ORAL | Status: DC
  Filled 2014-10-22 (×3): qty 1

## 2014-10-22 MED ORDER — SIMVASTATIN 40 MG PO TABS
40.0000 mg | ORAL_TABLET | Freq: Every day | ORAL | Status: DC
  Administered 2014-10-22 – 2014-10-23 (×2): 40 mg via ORAL
  Filled 2014-10-22 (×2): qty 1

## 2014-10-22 MED ORDER — FUROSEMIDE 40 MG PO TABS
40.0000 mg | ORAL_TABLET | Freq: Every day | ORAL | Status: DC
  Administered 2014-10-22 – 2014-10-26 (×5): 40 mg via ORAL
  Filled 2014-10-22 (×5): qty 1

## 2014-10-22 MED ORDER — ENOXAPARIN SODIUM 40 MG/0.4ML ~~LOC~~ SOLN
40.0000 mg | SUBCUTANEOUS | Status: DC
  Administered 2014-10-22 – 2014-10-25 (×4): 40 mg via SUBCUTANEOUS
  Filled 2014-10-22 (×4): qty 0.4

## 2014-10-22 MED ORDER — MIDODRINE HCL 5 MG PO TABS
5.0000 mg | ORAL_TABLET | Freq: Three times a day (TID) | ORAL | Status: DC
  Administered 2014-10-22 – 2014-10-24 (×7): 5 mg via ORAL
  Filled 2014-10-22 (×7): qty 1

## 2014-10-22 MED ORDER — ACETAMINOPHEN 650 MG RE SUPP: 650.0000 mg | Freq: Four times a day (QID) | RECTAL | Status: DC | PRN

## 2014-10-22 MED ORDER — ASPIRIN 325 MG PO TABS
325.0000 mg | ORAL_TABLET | Freq: Every day | ORAL | Status: DC
  Administered 2014-10-23 – 2014-10-26 (×3): 325 mg via ORAL
  Filled 2014-10-22 (×7): qty 1

## 2014-10-22 MED ORDER — VITAMIN B-12 1000 MCG PO TABS
1000.0000 ug | ORAL_TABLET | Freq: Every day | ORAL | Status: DC
  Administered 2014-10-23 – 2014-10-26 (×4): 1000 ug via ORAL
  Filled 2014-10-22 (×4): qty 1

## 2014-10-22 MED ORDER — DEXTROSE 5 % IV SOLN
1.0000 g | Freq: Once | INTRAVENOUS | Status: AC
  Administered 2014-10-22: 1 g via INTRAVENOUS

## 2014-10-22 MED ORDER — HYDROCODONE-ACETAMINOPHEN 5-325 MG PO TABS
ORAL_TABLET | ORAL | Status: AC
  Filled 2014-10-22: qty 1

## 2014-10-22 MED ORDER — HYDROCODONE-ACETAMINOPHEN 5-325 MG PO TABS
1.0000 | ORAL_TABLET | Freq: Once | ORAL | Status: AC
  Administered 2014-10-22: 1 via ORAL

## 2014-10-22 MED ORDER — LEVOTHYROXINE SODIUM 25 MCG PO TABS
25.0000 ug | ORAL_TABLET | Freq: Every day | ORAL | Status: DC
  Administered 2014-10-23 – 2014-10-26 (×4): 25 ug via ORAL
  Filled 2014-10-22 (×4): qty 1

## 2014-10-22 NOTE — Progress Notes (Signed)
Skin assessment noted with midline sternum incision that is dry and intact.  No drainage or smell noted.  Pt has upper abdominal incison with three stitches that is dry and intact.  There are two puncture sites with steri strips to both sites.  Right leg below knee area noted with a small puncture with steri strips.  No drainage noted.  Skin assessment done with Tammy, RN. Pt alert and oriented x4, no complaints of pain or discomfort.  Bed in low position, call bell within reach.  Bed alarms on and functioning.  Assessment done and charted.  Will continue to monitor and do hourly rounding throughout the shift

## 2014-10-22 NOTE — ED Notes (Signed)
MD Sainini at bedside.

## 2014-10-22 NOTE — ED Notes (Signed)
MD Lord at bedside. 

## 2014-10-22 NOTE — ED Provider Notes (Signed)
[facility] Emergency Department Provider Note   ____________________________________________  Time seen: On EMS arrival I have reviewed the triage vital signs and the triage nursing note.  HISTORY  Chief Complaint Weakness   Historian Patient and family  HPI Jenna Marshall is a 81 y.o. female who is brought in from [facility] a rehabilitation facility after being discharged from [facility] yesterday after a hospital stay for CABG. The complaint was he lies weakness and inability to get up and walk around this morning. Yesterday she had a big day being transferred out of the hospital. She did miss her nap. She did physical therapy 2. She reports having had to use oxygen intermittently in the hospital however the last day and a half prior to discharge she did not need any oxygen in the hospital. She's not had any new chest pain or trouble breathing area she's had no fever. Symptoms are moderate    Past Medical History  Diagnosis Date  . Macular degeneration   . Hyperlipidemia   . Hypertension   . Thyroid disease   . Depression   . pernicious anemia   . Varicose veins     Patient Active Problem List   Diagnosis Date Noted  . Other chest pain 10/02/2014  . Intertriginous candidiasis 10/02/2014  . Medicare annual wellness visit, subsequent 04/01/2014  . Urinary incontinence, urge 09/25/2013  . Osteopenia of the elderly 05/13/2013  . Knee pain, bilateral 04/05/2013  . Long-term use of high-risk medication 03/12/2013  . History of shingles 09/02/2012  . History of DVT of lower extremity 09/02/2012  . Obesity (BMI 30-39.9) 09/01/2011  . Macular degeneration   . Hyperlipidemia   . Hypertension   . Hypothyroidism     History reviewed. No pertinent past surgical history.  Current Outpatient Rx  Name  Route  Sig  Dispense  Refill  . aspirin 81 MG tablet   Oral   Take 81 mg by mouth daily.           . Calcium Carbonate-Vitamin D (CALCIUM  + D) 600-200 MG-UNIT TABS   Oral   Take by mouth.           . Cyanocobalamin 1000 MCG SUBL   Sublingual   Place 1 tablet (1,000 mcg total) under the tongue daily.   90 tablet   3   . diclofenac (CATAFLAM) 50 MG tablet   Oral   Take 1 tablet (50 mg total) by mouth 3 (three) times daily.   60 tablet   1   . levothyroxine (SYNTHROID, LEVOTHROID) 25 MCG tablet      TAKE 1 TABLET DAILY FOR THYROID   30 tablet   6   . levothyroxine (SYNTHROID, LEVOTHROID) 25 MCG tablet      TAKE 1 TABLET DAILY FOR THYROID   30 tablet   6   . losartan (COZAAR) 100 MG tablet      TAKE ONE (1) TABLET BY MOUTH EVERY DAY   30 tablet   5   . Multiple Vitamin (MULTIVITAMIN) tablet   Oral   Take 1 tablet by mouth daily.           . Multiple Vitamins-Minerals (PRESERVISION AREDS PO)   Oral   Take by mouth 2 (two) times daily.           . mupirocin ointment (BACTROBAN) 2 %   Nasal   Place 1 application into the nose 2 (two) times daily.   22 g  0   . nystatin (MYCOSTATIN) powder   Topical   Apply topically 2 (two) times daily.   15 g   3   . oxybutynin (DITROPAN) 5 MG tablet   Oral   Take 1 tablet (5 mg total) by mouth 3 (three) times daily.   90 tablet   2   . pantoprazole (PROTONIX) 40 MG tablet   Oral   Take 1 tablet (40 mg total) by mouth daily.   30 tablet   3   . simvastatin (ZOCOR) 40 MG tablet      TAKE ONE TABLET BY MOUTH EVERY BEDTIME FOR CHOLESTEROL   30 tablet   6   . traMADol (ULTRAM) 50 MG tablet   Oral   Take 1 tablet (50 mg total) by mouth every 6 (six) hours as needed for pain.   120 tablet   3     Allergies Codeine  Family History  Problem Relation Age of Onset  . Diabetes Mother   . Heart disease Father     Social History History  Substance Use Topics  . Smoking status: Never Smoker   . Smokeless tobacco: Never Used  . Alcohol Use: No    Review of Systems  Constitutional: Negative for fever. Eyes: Negative for visual  changes. ENT: Negative for sore throat. Cardiovascular: Negative for chest pain. Respiratory: Negative for shortness of breath. Gastrointestinal: Negative for abdominal pain, vomiting and diarrhea. Genitourinary: Negative for dysuria. Musculoskeletal: Negative for back pain. Skin: Negative for rash. Neurological: Negative for headaches, focal weakness or numbness.  ____________________________________________   PHYSICAL EXAM:  VITAL SIGNS: ED Triage Vitals  Enc Vitals Group     BP 10/22/14 1004 148/65 mmHg     Pulse Rate 10/22/14 1004 91     Resp 10/22/14 1004 18     Temp 10/22/14 1004 98.4 F (36.9 C)     Temp Source 10/22/14 1004 Oral     SpO2 10/22/14 1004 88 %     Weight 10/22/14 1004 194 lb 8 oz (88.225 kg)     Height 10/22/14 1004 5\' 4"  (1.626 m)     Head Cir --      Peak Flow --      Pain Score --      Pain Loc --      Pain Edu? --      Excl. in [place]? --      Constitutional: Alert and oriented. Well appearing and in no distress. Eyes: Conjunctivae are normal. PERRL. Normal extraocular movements. ENT   Head: Normocephalic and atraumatic.   Nose: No congestion/rhinnorhea.   Mouth/Throat: Mucous membranes are mildly dry.   Neck: No stridor. Cardiovascular: Normal rate, regular rhythm.  No murmurs, rubs, or gallops. Respiratory: Breath sounds are clear with no wheezes or rhonchi. Gastrointestinal: Soft and nontender. No distention.  Genitourinary: Musculoskeletal: Nontender with normal range of motion in all extremities. No joint effusions.  No lower extremity tenderness nor edema. Patient has point tenderness and redness located over the right first MTP joint. No extension or cellulitis. Neurologic:  Normal speech and language. No gross focal neurologic deficits are appreciated. Skin:  Skin is warm, dry and intact. No rash noted. Psychiatric: Mood and affect are normal. Speech and behavior are normal. Patient exhibits appropriate insight and  judgment.  ____________________________________________   EKG  I, Lisa Roca, MD, the attending physician have personally viewed and interpreted this ECG.  92 bpm. Normal sinus rhythm, normal axis, normal QRS, nonspecific ST and T-wave.  ____________________________________________  LABS (pertinent positives/negatives)  Urinalysis positive for 3+ leukocytes, 6-30 red blood cells, too numerous to count white blood cells and positive for nitrites Uric acid normal at 5.2 White blood count 14.5 and hemoglobin of 91.5 Basic metabolic and troponin are pending   ____________________________________________  RADIOLOGY Radiologist results reviewed  Chest x-ray report will: Moderate left sided pleural effusion and small to moderate right pleural effusion with probable atelectasis pneumonia cannot be excluded radiographically __________________________________________  PROCEDURES  Procedure(s) performed: No Critical Care performed: No  ____________________________________________   ED COURSE / ASSESSMENT AND PLAN  Pertinent labs & imaging results that were available during my care of the patient were reviewed by me and considered in my medical decision making (see chart for details).   Initial impression is that the patient may have overexerted herself yesterday having been awake all day, missed her nap, taken and listed her new nursing home facility, and doing physical therapy twice. She said that she has noticed doing her incentive primary 10 times per hour because she is "too old ". However she does state she is extremely weak and cannot get up. She has no focal weakness I do not suspect a stroke.  Her chest x-ray showed right left pleural effusions and possible associated pneumonia per the radiologist read however I consider this likely to be still postoperative. When I obtained her x-ray read from [facility], it had the exact same read and therefore I suspect acute pneumonia  hospital acquired to be less likely. I suspect that her hypoxia is due to hypoventilation postoperatively as she says she's had been unable to continue using her incentive spirometer.  Her UA is positive for a urine tract infection with positive nitrites. I gave her IV Rocephin and sent a culture. Given her extreme weakness I also sent blood cultures.  Patient is extremely weak and unable to stand. She had been off of oxygen for a day and a half prior to discharge and now she is requiring 2-3 L to maintain oxygenation. I discussed with the hospitalist regarding overnight observation due to the acute generalized weakness, worsening hypoxia, and urinary tract infection. She does have an increased white blood cell count from prior to surgery and a feeling she is to be observed to make sure that that this does not turn into sepsis. Discussed with the hospitalist. Family very much wants to have her observed overnight versus go back to the nursing home._ __________________________________________   FINAL CLINICAL IMPRESSION(S) / ED DIAGNOSES   Final diagnoses:  Urinary tract infection with hematuria, site unspecified  Hypoxia      Lisa Roca, MD 10/22/14 1538

## 2014-10-22 NOTE — ED Notes (Signed)
Upon entering room for rounding, pt explained to RN painful and tender right foot. RN assessed foot and noted reddened right big toe extending up the foot. Pt states constant "throbbing pain" MD Lord made aware, will assess. No further orders given at this time.

## 2014-10-22 NOTE — H&P (Signed)
[facility] NAME: Jenna Marshall    MR#:  [id number]  DATE OF BIRTH:  20-Mar-1926  DATE OF ADMISSION:  10/22/2014  PRIMARY CARE PHYSICIAN: [name], MD   REQUESTING/REFERRING PHYSICIAN: Dr. Reita Cliche  CHIEF COMPLAINT:   Chief Complaint  Patient presents with  . Weakness   Generalized weakness.    HISTORY OF PRESENT ILLNESS:  Jenna Marshall  is a 81 y.o. female with a known history of hypertension, hyperlipidemia, depression, macular degeneration, history of recent CABG, presents to the hospital from a skilled nursing facility due to generalized weakness and also noted to be mildly hypoxic.  Patient was noted to have a urinary tract infection and noted to have a leukocytosis. Her chest x-ray revealed some mild atelectasis without any evidence of acute pneumonia although she was noted to be hypoxic with O2 sats in the high 80s.  Patient is being admitted to the hospital for treatment for UTI.    PAST MEDICAL HISTORY:   Past Medical History  Diagnosis Date  . Macular degeneration   . Hyperlipidemia   . Hypertension   . Thyroid disease   . Depression   . pernicious anemia   . Varicose veins     PAST SURGICAL HISTORY:  History reviewed. No pertinent past surgical history.  SOCIAL HISTORY:   History  Substance Use Topics  . Smoking status: Never Smoker   . Smokeless tobacco: Never Used  . Alcohol Use: No    FAMILY HISTORY:   Family History  Problem Relation Age of Onset  . Diabetes Mother   . Heart disease Father     DRUG ALLERGIES:   Allergies  Allergen Reactions  . Codeine Nausea Only  . Codeine Sulfate Hives    REVIEW OF SYSTEMS:   Review of Systems  Constitutional: Negative for fever and weight loss.  HENT: Negative for congestion, nosebleeds and tinnitus.   Eyes: Negative for blurred vision, double vision and redness.  Respiratory: Negative for cough, hemoptysis and shortness of breath.    Cardiovascular: Negative for chest pain, orthopnea, leg swelling and PND.  Gastrointestinal: Negative for nausea, vomiting, abdominal pain, diarrhea and melena.  Genitourinary: Negative for dysuria, urgency and hematuria.  Musculoskeletal: Negative for joint pain and falls.  Neurological: Positive for weakness (generalized). Negative for dizziness, tingling, sensory change, focal weakness, seizures and headaches.  Endo/Heme/Allergies: Negative for polydipsia. Does not bruise/bleed easily.  Psychiatric/Behavioral: Negative for depression and memory loss. The patient is not nervous/anxious.     MEDICATIONS AT HOME:   Prior to Admission medications   Medication Sig Start Date End Date Taking? Authorizing Provider  Calcium Carbonate-Vitamin D (CALCIUM + D) 600-200 MG-UNIT TABS Take by mouth.      Historical Provider, MD  Cyanocobalamin 1000 MCG SUBL Place 1 tablet (1,000 mcg total) under the tongue daily. 09/22/13   [name], MD  diclofenac (CATAFLAM) 50 MG tablet Take 1 tablet (50 mg total) by mouth 3 (three) times daily. 09/29/14   [name], MD  levothyroxine (SYNTHROID, LEVOTHROID) 25 MCG tablet TAKE 1 TABLET DAILY FOR THYROID 07/14/14   [name], MD  levothyroxine (SYNTHROID, LEVOTHROID) 25 MCG tablet TAKE 1 TABLET DAILY FOR THYROID 07/14/14   [name], MD  losartan (COZAAR) 100 MG tablet TAKE ONE (1) TABLET BY MOUTH EVERY DAY 04/21/14   [name], MD  Multiple Vitamin (MULTIVITAMIN) tablet Take 1 tablet by mouth daily.  Historical Provider, MD  Multiple Vitamins-Minerals (PRESERVISION AREDS PO) Take by mouth 2 (two) times daily.      Historical Provider, MD  mupirocin ointment (BACTROBAN) 2 % Place 1 application into the nose 2 (two) times daily. 06/09/14   Jearld Fenton, NP  nystatin (MYCOSTATIN) powder Apply topically 2 (two) times daily. 09/29/14   [name], MD  oxybutynin (DITROPAN) 5 MG tablet Take 1 tablet (5 mg total) by mouth 3 (three) times daily.  03/30/14   [name], MD  pantoprazole (PROTONIX) 40 MG tablet Take 1 tablet (40 mg total) by mouth daily. 09/29/14   [name], MD  simvastatin (ZOCOR) 40 MG tablet TAKE ONE TABLET BY MOUTH EVERY BEDTIME FOR CHOLESTEROL 08/31/14   [name], MD  traMADol (ULTRAM) 50 MG tablet Take 1 tablet (50 mg total) by mouth every 6 (six) hours as needed for pain. 04/05/13   [name], MD      VITAL SIGNS:  Blood pressure 131/67, pulse 86, temperature 98.4 F (36.9 C), temperature source Oral, resp. rate 18, height 5\' 4"  (1.626 m), weight 88.225 kg (194 lb 8 oz), SpO2 89 %.  PHYSICAL EXAMINATION:  Physical Exam  GENERAL:  81 y.o.-year-old patient lying in the bed in no distress.  EYES: Pupils equal, round, reactive to light and accommodation. No scleral icterus. Extraocular muscles intact.  HEENT: Head atraumatic, normocephalic. Oropharynx and nasopharynx clear. No oropharyngeal erythema, moist oral mucosa  NECK:  Supple, no jugular venous distention. No thyroid enlargement, no tenderness.  LUNGS: Normal breath sounds bilaterally, no wheezing, rales, rhonchi. No use of accessory muscles of respiration.  CARDIOVASCULAR: S1, S2 normal. No murmurs, rubs, or gallops.  Mid chest scar from recent CABG.   ABDOMEN: Soft, nontender, nondistended. Bowel sounds present. No organomegaly or mass.  EXTREMITIES: No pedal edema, cyanosis, or clubbing. + 2 pedal & radial pulses b/l.   NEUROLOGIC: Cranial nerves II through XII are intact. No focal Motor or sensory deficits appreciated b/l. Globally weak.  PSYCHIATRIC: The patient is alert and oriented x 3. Good affect.  SKIN: No obvious rash, lesion, or ulcer.   LABORATORY PANEL:   CBC  Recent Labs Lab 10/22/14 1032  WBC 14.5*  HGB 10.3*  HCT 30.9*  PLT 285   ------------------------------------------------------------------------------------------------------------------  Chemistries   Recent Labs Lab 10/22/14 1032  NA 135  K 4.2   CL 97*  CO2 28  GLUCOSE 131*  BUN 22*  CREATININE 1.05*  CALCIUM 8.4*   ------------------------------------------------------------------------------------------------------------------  Cardiac Enzymes  Recent Labs Lab 10/22/14 1032  TROPONINI 0.06*   ------------------------------------------------------------------------------------------------------------------  RADIOLOGY:  Dg Chest Port 1 View  10/22/2014   CLINICAL DATA:  Status post CABG 5 days ago. Progressive weakness this morning.  EXAM: PORTABLE CHEST - 1 VIEW  COMPARISON:  None.  FINDINGS: Enlarged cardiac silhouette. Post CABG changes. Bibasilar pleural fluid and ill-defined opacity. Mildly prominent pulmonary vasculature. Unremarkable bones.  IMPRESSION: 1. Moderate-sized left pleural effusion and small to moderate-sized right pleural effusion with bibasilar probable associated atelectasis. Pneumonia cannot be excluded radiographically. 2. Cardiomegaly and mild pulmonary vascular congestion.   Electronically Signed   By: Claudie Revering M.D.   On: 10/22/2014 10:54     IMPRESSION AND PLAN:   81 year old female with past medical history of hypertension, hyperlipidemia, recent CABG,  Depression, hypothyroidism, osteoarthritis, GERD, who presents to the hospital from a skilled nursing facility due to generalized weakness and also noted to be hypoxic.  #1 acute respiratory failure  with hypoxia - this is likely secondary to atelectasis/mild CHF.  Clinically although patient is not symptomatic with any complaints of shortness of breath. I will continue incentive spirometry and monitor. I will hold off on antibiotics at this time  #2 urinary tract infection this is likely the cause of patient's generalized weakness and leukocytosis. I will treat the patient with IV antibiotics with ceftriaxone and follow urine cultures  #3 hypothyroidism continue Synthroid  #4 status post CABG - patient has no acute chest pain or any shortness  of breath. We'll continue aspirin Lasix and statin. - She is followed by Dr. Fletcher Anon  #5 GERD continue Protonix   All the records are reviewed and case discussed with ED provider. Management plans discussed with the patient, family and they are in agreement.  CODE STATUS: Full  TOTAL TIME TAKING CARE OF THIS PATIENT: 50 minutes.    Henreitta Leber M.D on 10/22/2014 at 2:32 PM  Between 7am to 6pm - Pager - [phone]  After 6pm go to [email or web address] - password [email or web address]  Office  [phone]  CC: Primary care physician; [name], MD

## 2014-10-22 NOTE — ED Notes (Signed)
Patient to ED via EMS from [facility]. Patient is post CABG on Tuesday and was discharged to [facility] Friday. Patient reports she had a very active day yesterday and today has been having increasing weakness. Patient also reports some extra added pain and tenderness to leg that vein was taken from. Patient also noted to have oxygen saturation of 88-90% from SNF. Patient reports that she had been on oxygen up until yesterday morning and then was put back on it late last night for a while.

## 2014-10-23 ENCOUNTER — Inpatient Hospital Stay: Payer: Commercial Managed Care - HMO

## 2014-10-23 LAB — CBC
HCT: 28.4 % — ABNORMAL LOW (ref 35.0–47.0)
Hemoglobin: 9.3 g/dL — ABNORMAL LOW (ref 12.0–16.0)
MCH: 31.6 pg (ref 26.0–34.0)
MCHC: 32.9 g/dL (ref 32.0–36.0)
MCV: 96.2 fL (ref 80.0–100.0)
Platelets: 265 10*3/uL (ref 150–440)
RBC: 2.95 MIL/uL — ABNORMAL LOW (ref 3.80–5.20)
RDW: 13.9 % (ref 11.5–14.5)
WBC: 13.3 10*3/uL — ABNORMAL HIGH (ref 3.6–11.0)

## 2014-10-23 LAB — URINE CULTURE

## 2014-10-23 LAB — BASIC METABOLIC PANEL
Anion gap: 6 (ref 5–15)
BUN: 26 mg/dL — AB (ref 6–20)
CO2: 32 mmol/L (ref 22–32)
Calcium: 8.5 mg/dL — ABNORMAL LOW (ref 8.9–10.3)
Chloride: 97 mmol/L — ABNORMAL LOW (ref 101–111)
Creatinine, Ser: 1.1 mg/dL — ABNORMAL HIGH (ref 0.44–1.00)
GFR, EST AFRICAN AMERICAN: 51 mL/min — AB (ref >60)
GFR, EST NON AFRICAN AMERICAN: 44 mL/min — AB (ref >60)
GLUCOSE: 126 mg/dL — AB (ref 65–99)
Potassium: 3.9 mmol/L (ref 3.5–5.1)
SODIUM: 135 mmol/L (ref 135–145)

## 2014-10-23 LAB — URIC ACID: Uric Acid, Serum: 5.3 mg/dL (ref 2.3–6.6)

## 2014-10-23 MED ORDER — PREDNISONE 5 MG PO TABS
5.0000 mg | ORAL_TABLET | Freq: Three times a day (TID) | ORAL | Status: AC
  Administered 2014-10-24 (×3): 5 mg via ORAL
  Filled 2014-10-23 (×2): qty 1

## 2014-10-23 MED ORDER — I-VITE PROTECT PO TABS
1.0000 | ORAL_TABLET | Freq: Every day | ORAL | Status: DC
  Administered 2014-10-23 – 2014-10-26 (×4): 1 via ORAL
  Filled 2014-10-23 (×4): qty 1

## 2014-10-23 MED ORDER — COLCHICINE 0.6 MG PO TABS
0.6000 mg | ORAL_TABLET | Freq: Two times a day (BID) | ORAL | Status: DC
  Administered 2014-10-23 – 2014-10-26 (×7): 0.6 mg via ORAL
  Filled 2014-10-23 (×7): qty 1

## 2014-10-23 MED ORDER — PREDNISONE 5 MG PO TABS: 5.0000 mg | ORAL_TABLET | ORAL | Status: AC

## 2014-10-23 MED ORDER — PREDNISONE 5 MG PO TABS
5.0000 mg | ORAL_TABLET | Freq: Four times a day (QID) | ORAL | Status: DC
  Administered 2014-10-25 – 2014-10-26 (×6): 5 mg via ORAL
  Filled 2014-10-23 (×7): qty 1

## 2014-10-23 MED ORDER — SODIUM CHLORIDE 0.9 % IJ SOLN: 3.0000 mL | INTRAMUSCULAR | Status: DC | PRN

## 2014-10-23 MED ORDER — POTASSIUM CHLORIDE CRYS ER 10 MEQ PO TBCR
10.0000 meq | EXTENDED_RELEASE_TABLET | Freq: Every day | ORAL | Status: DC
  Administered 2014-10-23 – 2014-10-26 (×4): 10 meq via ORAL
  Filled 2014-10-23 (×3): qty 1

## 2014-10-23 MED ORDER — PREDNISONE 5 MG PO TABS
10.0000 mg | ORAL_TABLET | Freq: Every evening | ORAL | Status: AC
  Administered 2014-10-23: 10 mg via ORAL

## 2014-10-23 MED ORDER — PREDNISONE 5 MG PO TABS
5.0000 mg | ORAL_TABLET | ORAL | Status: AC
  Filled 2014-10-23: qty 1

## 2014-10-23 MED ORDER — PREDNISONE 5 MG PO TABS
10.0000 mg | ORAL_TABLET | Freq: Every morning | ORAL | Status: AC
  Administered 2014-10-23: 10 mg via ORAL
  Filled 2014-10-23: qty 2

## 2014-10-23 MED ORDER — PREDNISONE 5 MG PO TABS
10.0000 mg | ORAL_TABLET | Freq: Every evening | ORAL | Status: AC
  Administered 2014-10-24: 10 mg via ORAL
  Filled 2014-10-23 (×3): qty 2

## 2014-10-23 NOTE — Evaluation (Signed)
Physical Therapy Evaluation Patient Details Name: Jenna Marshall MRN: [id number] DOB: July 01, 1925 Today's Date: 10/23/2014   History of Present Illness  Pt is an 81yo white female c PMH HTN, depression, and macular degeneration, who was recently DC from [facility] s/p CABG (on POD 9 per daughter). Pt was at [facility] for one daty when she began to feel weak and malaised. Pt arrived at [facility] found to have UTI. Family has been most converned about weakness and poor appetit since surgery.   Clinical Impression  Pt presenting with generalized malaise and fatigue, with new onset of pain in R foot and L low back. Pt recently DC to [facility] after lengthy hospitalization, and should return to continue recovery of baseline function. Patient presents with impairment of strength, pain, range of motion, and activity tolerance, limiting ability to perform ADL, IADL, and ambulation. Patient will benefit from skilled intervention to address the above impairments and limitations, in order to restore to prior level of function and to decrease caregiver burden.      Follow Up Recommendations SNF    Equipment Recommendations       Recommendations for Other Services       Precautions / Restrictions Precautions Precautions: Fall;Sternal Precaution Comments: Question regarding sternal precautions Restrictions Weight Bearing Restrictions: No      Mobility  Bed Mobility Overal bed mobility: +2 for physical assistance             General bed mobility comments: Very labored and weak, unable to provide much assistance.   Transfers                 General transfer comment: Unwilling to attempt secondary to R foot pain.   Ambulation/Gait Ambulation/Gait assistance:  (Unable to attempt. )              Stairs            Wheelchair Mobility    Modified Rankin (Stroke Patients Only)       Balance Overall balance assessment: Needs assistance Sitting-balance support: No  upper extremity supported Sitting balance-Leahy Scale: Good   Postural control: Other (comment) (Limited by new back pain. )                                   Pertinent Vitals/Pain Pain Assessment: Faces Faces Pain Scale: Hurts whole lot Pain Location: New onset L low back pain worse with mobility and transfers; R throbbing hallux pain preventing weightbearing; R pollux pain with gripping activities.  Pain Descriptors / Indicators: Aching;Pounding Pain Intervention(s): Limited activity within patient's tolerance;Repositioned;Monitored during session    Home Living Family/patient expects to be discharged to:: Skilled nursing facility                      Prior Function Level of Independence: Needs assistance   Gait / Transfers Assistance Needed: ModI   ADL's / Homemaking Assistance Needed: ModI   Comments: household ambulation with SPC, family helps with IADL     Hand Dominance        Extremity/Trunk Assessment   Upper Extremity Assessment: RUE deficits/detail RUE Deficits / Details: Pain in R thumb, painful with gripping activies.          Lower Extremity Assessment: RLE deficits/detail;LLE deficits/detail RLE Deficits / Details: R foot is painful, with visible erythema and warmth over the R TMT joint, worse with sitting positioning.  LLE  Deficits / Details:  (Back pain causing guarding in BLE. )     Communication   Communication: HOH;No difficulties  Cognition Arousal/Alertness: Lethargic (Falls asleep 2x during interview. ) Behavior During Therapy: Anxious (wanting to avoid any potentially provocative positions or movement.  ) Overall Cognitive Status: Within Functional Limits for tasks assessed                      General Comments      Exercises        Assessment/Plan    PT Assessment Patient needs continued PT services  PT Diagnosis Difficulty walking;Generalized weakness;Acute pain   PT Problem List Decreased  strength;Obesity;Decreased range of motion;Decreased activity tolerance;Decreased balance;Pain;Decreased mobility  PT Treatment Interventions DME instruction;Gait training;Functional mobility training;Therapeutic activities;Therapeutic exercise;Manual techniques;Patient/family education   PT Goals (Current goals can be found in the Care Plan section) Acute Rehab PT Goals Patient Stated Goal: Get rid of back and foot pain  PT Goal Formulation: With patient Time For Goal Achievement: 11/06/14 Potential to Achieve Goals: Good    Frequency Min 2X/week   Barriers to discharge Inaccessible home environment      Co-evaluation               End of Session   Activity Tolerance: Patient limited by fatigue;Patient limited by lethargy;Patient limited by pain Patient left: in bed;with bed alarm set;with call bell/phone within reach;with family/visitor present Nurse Communication: Other (comment)         Time: 1497-0263 PT Time Calculation (min) (ACUTE ONLY): 34 min   Charges:   PT Evaluation $Initial PT Evaluation Tier I: 1 Procedure     PT G Codes:        Piera Downs C 10-24-14, 11:58 AM  Etta Grandchild, PT, DPT

## 2014-10-23 NOTE — Progress Notes (Signed)
Pt complaining of right big toe pain.  Redden area noted but no skin laceration or broken skin.  Pt also complaining of lower back pain and just not feeling good.    Dr Francis Dowse put in consult for ortho and pt was seen this am by the consulting discipline.  Pt resting with family at bedside

## 2014-10-23 NOTE — Consult Note (Signed)
Patient reports several days of significant right great toe pain. She reports an episode previously that her foot doctor gave him an injection for pain relief. She is not aware of any diagnosis of gout in the past. She has severe intense pain with any weightbearing and to pressure.  On examination there is erythema on the dorsum of the great toe with out significant swelling or effusion, sensation is intact. Passive range of motion is intensely painful. She has brisk capillary refill. The remaining toes appear normal there is no significant deformity to the foot.  In past history is not positive for diabetes or gout.  On radiographs there is mild degenerative change with some soft tissue calcification possibly consistent with a prior gout but no lytic lesions in the joint.  Impression is probable gout right great toe recommendation is for oral medication which were prescribed, colchicine and prednisone. Uric acid level also ordered

## 2014-10-23 NOTE — Progress Notes (Signed)
Pt alert and oriented x4, complaints of pain and lower back discomfort.  Bed in low position, call bell within reach.  Bed alarms on and functioning.  Assessment done and charted.  Will continue to monitor and do hourly rounding throughout the shift

## 2014-10-23 NOTE — Progress Notes (Signed)
Medicated pt with meds that were prescriped by the ortho dr.  Aletha Halim assess pt to see if pain is relieved

## 2014-10-23 NOTE — Progress Notes (Signed)
[place] [facility] [place] NAME: Jenna Marshall    MR#:  [id number]  DATE OF BIRTH:  [birth year]  SUBJECTIVE:  CHIEF COMPLAINT:  Generalized weakness Patient is out of bed to chair today. Complaining of right great toe pain with redness and right thumb pain. Concerned about gout. Reporting back pain. Feeling depressed as she is hospitalized more often. Family members at bedside  REVIEW OF SYSTEMS:  CONSTITUTIONAL: No fever, fatigue or reporting generalized weakness.  EYES: No blurred or double vision.  EARS, NOSE, AND THROAT: No tinnitus or ear pain.  RESPIRATORY: No cough, shortness of breath, wheezing or hemoptysis.  CARDIOVASCULAR: No chest pain, orthopnea, edema.  GASTROINTESTINAL: No nausea, vomiting, diarrhea or abdominal pain.  GENITOURINARY: No dysuria, hematuria.  ENDOCRINE: No polyuria, nocturia,  HEMATOLOGY: No anemia, easy bruising or bleeding SKIN: No rash or lesion. MUSCULOSKELETAL: Reporting right thumb, right toe pain and back pain has history of osteoarthritis  NEUROLOGIC: No tingling, numbness, weakness.  PSYCHIATRY: No anxiety ; has history of depression.   DRUG ALLERGIES:   Allergies  Allergen Reactions  . Codeine Nausea Only  . Codeine Sulfate Hives    VITALS:  Blood pressure 138/54, pulse 92, temperature 99.6 F (37.6 C), temperature source Oral, resp. rate 18, height 5\' 4"  (1.626 m), weight 85.821 kg (189 lb 3.2 oz), SpO2 93 %.  PHYSICAL EXAMINATION:  GENERAL:  81 y.o.-year-old patient lying in the bed with no acute distress.  EYES: Pupils equal, round, reactive to light and accommodation. No scleral icterus. Extraocular muscles intact.  HEENT: Head atraumatic, normocephalic. Oropharynx and nasopharynx clear.  NECK:  Supple, no jugular venous distention. No thyroid enlargement, no tenderness.  LUNGS: Normal breath sounds bilaterally, no wheezing, rales,rhonchi or crepitation. No use of accessory muscles of  respiration.  CARDIOVASCULAR: S1, S2 normal. No murmurs, rubs, or gallops.  ABDOMEN: Soft, nontender, nondistended. Bowel sounds present. No organomegaly or mass.  EXTREMITIES: Right great toe is erythematous and tender with minimal swelling. Right thumb is tender but no erythema noted. No pedal edema, cyanosis, or clubbing.  NEUROLOGIC: Cranial nerves II through XII are intact. Muscle strength 5/5 in all extremities. Sensation intact. Gait not checked.  PSYCHIATRIC: The patient is alert and oriented x 3.  SKIN: No obvious rash, lesion, or ulcer.    LABORATORY PANEL:   CBC  Recent Labs Lab 10/23/14 0444  WBC 13.3*  HGB 9.3*  HCT 28.4*  PLT 265   ------------------------------------------------------------------------------------------------------------------  Chemistries   Recent Labs Lab 10/23/14 0444  NA 135  K 3.9  CL 97*  CO2 32  GLUCOSE 126*  BUN 26*  CREATININE 1.10*  CALCIUM 8.5*   ------------------------------------------------------------------------------------------------------------------  Cardiac Enzymes  Recent Labs Lab 10/22/14 1032  TROPONINI 0.06*   ------------------------------------------------------------------------------------------------------------------  RADIOLOGY:  Dg Chest Port 1 View  10/22/2014   CLINICAL DATA:  Status post CABG 5 days ago. Progressive weakness this morning.  EXAM: PORTABLE CHEST - 1 VIEW  COMPARISON:  None.  FINDINGS: Enlarged cardiac silhouette. Post CABG changes. Bibasilar pleural fluid and ill-defined opacity. Mildly prominent pulmonary vasculature. Unremarkable bones.  IMPRESSION: 1. Moderate-sized left pleural effusion and small to moderate-sized right pleural effusion with bibasilar probable associated atelectasis. Pneumonia cannot be excluded radiographically. 2. Cardiomegaly and mild pulmonary vascular congestion.   Electronically Signed   By: Claudie Revering M.D.   On: 10/22/2014 10:54    EKG:   Orders  placed or performed during the hospital encounter of 10/22/14  . ED  EKG (<71mins upon arrival to the ED)  . ED EKG (<79mins upon arrival to the ED)  . EKG 12-Lead  . EKG 12-Lead    ASSESSMENT AND PLAN:    81 year old female with past medical history of hypertension, hyperlipidemia, recent CABG, Depression, hypothyroidism, osteoarthritis, GERD, who presents to the hospital from a skilled nursing facility due to generalized weakness and also noted to be hypoxic. Hypoxia is improved with 2 L of oxygen via nasal cannula. Denies any shortness of breath today.  # acute respiratory failure with hypoxia - this is likely secondary to atelectasis/mild CHF. Clinically although patient is not symptomatic with any complaints of shortness of breath. I will continue incentive spirometry and monitor.Hypoxia is improved with 2 L of oxygen via nasal cannula. Denies any shortness of breath today.    # urinary tract infection this is likely the cause of patient's generalized weakness and leukocytosis.  Continue IV antibiotics with ceftriaxone and follow urine cultures  # hypothyroidism continue Synthroid  # status post CABG - patient has no acute chest pain or any shortness of breath. We'll continue aspirin Lasix and statin. - She is followed by Dr. Fletcher Anon  # Right great toe pain-we will rule out gout. Check uric acid level. Check right foot x-ray. Consult is placed to orthopedics  #Right thumb pain-probably from osteoarthritis. Will provide pain management as needed basis. Get right hand x-ray as a reporting recent fall. With the consult is placed.  # Acute depression with past medical history of depression-consulted is placed to psychiatry. Patient denies any suicidal ideations or homicidal ideation.  #5 GERD continue Protonix    All the records are reviewed and case discussed with Care Management/Social Workerr. Management plans discussed with the patient, family and they are in agreement.  CODE  STATUS: Full code  TOTAL TIME TAKING CARE OF THIS PATIENT: 40 minutes.   POSSIBLE D/C IN 2-3 DAYS, DEPENDING ON CLINICAL CONDITION.   Nicholes Mango M.D on 10/23/2014 at 11:27 AM  Between 7am to 6pm - Pager - [phone] After 6pm go to [email or web address] - password [email or web address] [facility]  Office  [phone]  CC: Primary care physician; [name], MD

## 2014-10-24 ENCOUNTER — Encounter: Payer: Self-pay | Admitting: Physician Assistant

## 2014-10-24 ENCOUNTER — Other Ambulatory Visit: Payer: Self-pay | Admitting: *Deleted

## 2014-10-24 DIAGNOSIS — F4320 Adjustment disorder, unspecified: Secondary | ICD-10-CM

## 2014-10-24 DIAGNOSIS — R0902 Hypoxemia: Secondary | ICD-10-CM | POA: Insufficient documentation

## 2014-10-24 DIAGNOSIS — J9601 Acute respiratory failure with hypoxia: Secondary | ICD-10-CM

## 2014-10-24 DIAGNOSIS — I200 Unstable angina: Secondary | ICD-10-CM

## 2014-10-24 LAB — CBC
HCT: 28.5 % — ABNORMAL LOW (ref 35.0–47.0)
HEMOGLOBIN: 9.7 g/dL — AB (ref 12.0–16.0)
MCH: 32.2 pg (ref 26.0–34.0)
MCHC: 33.9 g/dL (ref 32.0–36.0)
MCV: 94.8 fL (ref 80.0–100.0)
PLATELETS: 302 10*3/uL (ref 150–440)
RBC: 3.01 MIL/uL — ABNORMAL LOW (ref 3.80–5.20)
RDW: 13.6 % (ref 11.5–14.5)
WBC: 13.9 10*3/uL — AB (ref 3.6–11.0)

## 2014-10-24 LAB — URIC ACID: URIC ACID, SERUM: 5.9 mg/dL (ref 2.3–6.6)

## 2014-10-24 MED ORDER — ASPIRIN EC 325 MG PO TBEC
DELAYED_RELEASE_TABLET | ORAL | Status: AC
  Administered 2014-10-24: 325 mg
  Filled 2014-10-24: qty 1

## 2014-10-24 MED ORDER — ATORVASTATIN CALCIUM 20 MG PO TABS
80.0000 mg | ORAL_TABLET | Freq: Every day | ORAL | Status: DC
  Administered 2014-10-24 – 2014-10-26 (×3): 80 mg via ORAL
  Filled 2014-10-24 (×4): qty 4

## 2014-10-24 MED ORDER — AZITHROMYCIN 250 MG PO TABS
250.0000 mg | ORAL_TABLET | Freq: Every day | ORAL | Status: DC
  Administered 2014-10-25 – 2014-10-26 (×2): 250 mg via ORAL
  Filled 2014-10-24 (×2): qty 1

## 2014-10-24 MED ORDER — AZITHROMYCIN 500 MG IV SOLR
500.0000 mg | Freq: Once | INTRAVENOUS | Status: AC
Start: 2014-10-24 — End: 2014-10-25
  Administered 2014-10-24: 500 mg via INTRAVENOUS
  Filled 2014-10-24: qty 500

## 2014-10-24 MED ORDER — CARVEDILOL 3.125 MG PO TABS
3.1250 mg | ORAL_TABLET | Freq: Two times a day (BID) | ORAL | Status: DC
  Administered 2014-10-24 – 2014-10-26 (×5): 3.125 mg via ORAL
  Filled 2014-10-24 (×5): qty 1

## 2014-10-24 MED ORDER — ZOLPIDEM TARTRATE 5 MG PO TABS
5.0000 mg | ORAL_TABLET | Freq: Every evening | ORAL | Status: DC | PRN
Start: 2014-10-24 — End: 2014-10-26
  Administered 2014-10-24 – 2014-10-25 (×2): 5 mg via ORAL
  Filled 2014-10-24 (×2): qty 1

## 2014-10-24 NOTE — Patient Outreach (Signed)
After call coordination call with Natividad Brood referring RNCM call placed to [facility] in regards to pt's impending transition to to home after SNF d/c. Message left for discharge planning personnel to place a call back to [facility].  Rutherford Limerick RN, BSN  [facility] Care Management [phone])

## 2014-10-24 NOTE — Consult Note (Signed)
[facility] Face-to-Face Psychiatry Consult   Reason for Consult:  Consult for this 81 year old woman with a past history of mild to moderate depression. Consult cited concern about depression Referring Physician:  Gouru Patient Identification: Jenna Marshall MRN:  [id number] Principal Diagnosis: Adjustment disorder Diagnosis:   Patient Active Problem List   Diagnosis Date Noted  . Adjustment disorder [F43.20] 10/24/2014  . UTI (lower urinary tract infection) [N39.0] 10/22/2014  . Other chest pain [R07.89] 10/02/2014  . Intertriginous candidiasis [B37.2] 10/02/2014  . Medicare annual wellness visit, subsequent [Z00.00] 04/01/2014  . Urinary incontinence, urge [N39.41] 09/25/2013  . Osteopenia of the elderly [M89.9] 05/13/2013  . Knee pain, bilateral [M25.561, M25.562] 04/05/2013  . Long-term use of high-risk medication [Z79.899] 03/12/2013  . History of shingles [Z86.19] 09/02/2012  . History of DVT of lower extremity [Z86.718] 09/02/2012  . Obesity (BMI 30-39.9) [E66.9] 09/01/2011  . Macular degeneration [H35.30]   . Hyperlipidemia [E78.5]   . Hypertension [I10]   . Hypothyroidism [E03.9]     Total Time spent with patient: 30 minutes  Subjective:   Jenna Marshall is a 81 y.o. female patient admitted with patient was admitted with some confusion and fatigue and symptoms of a urinary tract infection as a complication after a coronary bypass operation patient's chief complaint "I think that I was only feeling bad because of the surgery".  HPI:  History from the patient and the chart. Patient was surprised to see me and stated that she was not feeling depressed. She says that she thinks that she was feeling run down and tired and may have looked depressed because of how sick she was feeling after her surgery. She says now her mood is feeling fine. She feels optimistic and upbeat and is looking forward to getting out of the hospital. She slept poorly last night which she leans on her  prednisone. She is eating well today. Denies hopelessness or helplessness. Denies any suicidal ideation. Denies any hallucinations. Patient is looking forward to trying to live independently again. Just getting her infection treated seems to have made her feel better.  Past psychiatric history is obtained primarily from the patient as I was unable to find a specific record of a history of her depression. Patient says after her husband died several years ago she was down and depressed and that her primary care doctor prescribed an antidepressant. She says that she's been off that medicine for years and doesn't miss it. Denies any other psychiatric history. Denies any psychiatric hospitalization denies any history of suicidality or psychosis.  Social history: Patient lives independently. Has children in the area who look in on her feels like she has a good social support. Husband of over 44 years died a few years ago.  Medical history: Multiple medical problems including an acute urinary tract infection, macular degeneration, hyperlipidemia, high blood pressure, history of coronary artery disease history of hypothyroidism  Substance abuse history: Noncontributory HPI Elements:   Quality:  Mild low mood. Severity:  Mild and transient. Timing:  Occurred just after she became sick while in the rehabilitation facility. Duration:  Already clearing up lasting no more than a few days. Context:  Medical problems including urinary tract infection and pain after surgery.  Past Medical History:  Past Medical History  Diagnosis Date  . Macular degeneration   . Hyperlipidemia   . Hypertension   . Hypothyroidism   . Depression   . pernicious anemia   . Varicose veins   . CAD (coronary artery  disease)     a. Lexi 10/13/14: mid ant to apical & inf wall ischemia w/ WMA, mild-mod dep EF; b. cath 10/14/14: ost LM to LM 50%, dLM 99%, ost LAD 95%, ost LAD 95%, pLAD-LAD 40%, pRCA 30%, mRCA 70%, Critical LM stenosis.  Sig dRCA stenosis. Heavily calcified arteries; c. CABG x 4 (10/2014)  . Diastolic dysfunction     a. echo 10/2014: EF 55-60%, no RWMA, GR1DD, mild AI, trivial MR, mildly dilated LA, PASP normal    Past Surgical History  Procedure Laterality Date  . Coronary artery bypass graft  10/2014    LIMA-->LAD, SVG-->RCA, sequential SVG-->Ramus and OM   Family History:  Family History  Problem Relation Age of Onset  . Diabetes Mother   . Heart disease Father    Social History:  History  Alcohol Use No     History  Drug Use No    History   Social History  . Marital Status: Widowed    Spouse Name: N/A  . Number of Children: N/A  . Years of Education: N/A   Social History Main Topics  . Smoking status: Never Smoker   . Smokeless tobacco: Never Used  . Alcohol Use: No  . Drug Use: No  . Sexual Activity: No   Other Topics Concern  . None   Social History Narrative   Additional Social History:                          Allergies:   Allergies  Allergen Reactions  . Codeine Nausea Only  . Codeine Sulfate Hives    Labs:  Results for orders placed or performed during the hospital encounter of 10/22/14 (from the past 48 hour(s))  Urine culture     Status: None   Collection Time: 10/22/14  7:00 PM  Result Value Ref Range   Specimen Description URINE, CLEAN CATCH    Special Requests NONE    Culture DUPLICATE REQUEST TEST CREDITED     Report Status 10/23/2014 FINAL   Basic metabolic panel     Status: Abnormal   Collection Time: 10/23/14  4:44 AM  Result Value Ref Range   Sodium 135 135 - 145 mmol/L   Potassium 3.9 3.5 - 5.1 mmol/L   Chloride 97 (L) 101 - 111 mmol/L   CO2 32 22 - 32 mmol/L   Glucose, Bld 126 (H) 65 - 99 mg/dL   BUN 26 (H) 6 - 20 mg/dL   Creatinine, Ser 1.10 (H) 0.44 - 1.00 mg/dL   Calcium 8.5 (L) 8.9 - 10.3 mg/dL   GFR calc non Af Amer 44 (L) >60 mL/min   GFR calc Af Amer 51 (L) >60 mL/min    Comment: (NOTE) The eGFR has been calculated using  the CKD EPI equation. This calculation has not been validated in all clinical situations. eGFR's persistently <60 mL/min signify possible Chronic Kidney Disease.    Anion gap 6 5 - 15  CBC     Status: Abnormal   Collection Time: 10/23/14  4:44 AM  Result Value Ref Range   WBC 13.3 (H) 3.6 - 11.0 K/uL   RBC 2.95 (L) 3.80 - 5.20 MIL/uL   Hemoglobin 9.3 (L) 12.0 - 16.0 g/dL   HCT 28.4 (L) 35.0 - 47.0 %   MCV 96.2 80.0 - 100.0 fL   MCH 31.6 26.0 - 34.0 pg   MCHC 32.9 32.0 - 36.0 g/dL   RDW 13.9 11.5 - 14.5 %  Platelets 265 150 - 440 K/uL  Uric acid     Status: None   Collection Time: 10/23/14  4:44 AM  Result Value Ref Range   Uric Acid, Serum 5.3 2.3 - 6.6 mg/dL  Uric acid     Status: None   Collection Time: 10/24/14  5:58 AM  Result Value Ref Range   Uric Acid, Serum 5.9 2.3 - 6.6 mg/dL  CBC     Status: Abnormal   Collection Time: 10/24/14  5:58 AM  Result Value Ref Range   WBC 13.9 (H) 3.6 - 11.0 K/uL   RBC 3.01 (L) 3.80 - 5.20 MIL/uL   Hemoglobin 9.7 (L) 12.0 - 16.0 g/dL   HCT 28.5 (L) 35.0 - 47.0 %   MCV 94.8 80.0 - 100.0 fL   MCH 32.2 26.0 - 34.0 pg   MCHC 33.9 32.0 - 36.0 g/dL   RDW 13.6 11.5 - 14.5 %   Platelets 302 150 - 440 K/uL    Vitals: Blood pressure 143/64, pulse 82, temperature 98.2 F (36.8 C), temperature source Oral, resp. rate 19, height _0  (1.626 m), weight 86.728 kg (191 lb 3.2 oz), SpO2 96 %.  Risk to Self: Is patient at risk for suicide?: No Risk to Others:   Prior Inpatient Therapy:   Prior Outpatient Therapy:    Current Facility-Administered Medications  Medication Dose Route Frequency Provider Last Rate Last Dose  . acetaminophen (TYLENOL) tablet 650 mg  650 mg Oral Q6H PRN Henreitta Leber, MD       Or  . acetaminophen (TYLENOL) suppository 650 mg  650 mg Rectal Q6H PRN Henreitta Leber, MD      . aspirin tablet 325 mg  325 mg Oral Daily Henreitta Leber, MD   325 mg at 10/23/14 1312  . atorvastatin (LIPITOR) tablet 80 mg  80 mg Oral  q1800 Ryan M Dunn, PA-C      . calcium-vitamin D (OSCAL WITH D) 500-200 MG-UNIT per tablet 1 tablet  1 tablet Oral BID Henreitta Leber, MD   1 tablet at 10/24/14 0941  . carvedilol (COREG) tablet 3.125 mg  3.125 mg Oral BID WC Ryan M Dunn, PA-C      . cefTRIAXone (ROCEPHIN) 1 g in dextrose 5 % 50 mL IVPB  1 g Intravenous Q24H Henreitta Leber, MD   1 g at 10/24/14 1340  . colchicine tablet 0.6 mg  0.6 mg Oral BID Hessie Knows, MD   0.6 mg at 10/24/14 0941  . enoxaparin (LOVENOX) injection 40 mg  40 mg Subcutaneous Q24H Henreitta Leber, MD   40 mg at 10/24/14 1341  . furosemide (LASIX) tablet 40 mg  40 mg Oral Daily Henreitta Leber, MD   40 mg at 10/24/14 0941  . I-VITE PROTECT TABS 1 tablet  1 tablet Oral Q1200 Nicholes Mango, MD   1 tablet at 10/24/14 0940  . levothyroxine (SYNTHROID, LEVOTHROID) tablet 25 mcg  25 mcg Oral QAC breakfast Henreitta Leber, MD   25 mcg at 10/24/14 0941  . midodrine (PROAMATINE) tablet 5 mg  5 mg Oral TID WC Henreitta Leber, MD   5 mg at 10/24/14 1340  . multivitamin tablet 1 tablet  1 tablet Oral Daily Henreitta Leber, MD   1 tablet at 10/24/14 1100  . ondansetron (ZOFRAN) tablet 4 mg  4 mg Oral Q6H PRN Henreitta Leber, MD       Or  . ondansetron [facility]) injection 4 mg  4 mg Intravenous Q6H PRN Henreitta Leber, MD      . oxybutynin (DITROPAN) tablet 5 mg  5 mg Oral TID Henreitta Leber, MD   5 mg at 10/24/14 0942  . pantoprazole (PROTONIX) EC tablet 40 mg  40 mg Oral Daily Henreitta Leber, MD   40 mg at 10/24/14 0941  . potassium chloride (K-DUR,KLOR-CON) CR tablet 10 mEq  10 mEq Oral Daily Nicholes Mango, MD   10 mEq at 10/24/14 0941  . predniSONE (DELTASONE) tablet 10 mg  10 mg Oral Nightly Hessie Knows, MD      . predniSONE (DELTASONE) tablet 5 mg  5 mg Oral PC supper Hessie Knows, MD   5 mg at 10/23/14 2207  . predniSONE (DELTASONE) tablet 5 mg  5 mg Oral 3 x daily with food Hessie Knows, MD   5 mg at 10/24/14 1340  . [START ON 10/25/2014] predniSONE (DELTASONE) tablet  5 mg  5 mg Oral 4X daily taper Hessie Knows, MD      . sodium chloride 0.9 % injection 3 mL  3 mL Intravenous PRN Nicholes Mango, MD      . vitamin B-12 (CYANOCOBALAMIN) tablet 1,000 mcg  1,000 mcg Oral Daily Henreitta Leber, MD   1,000 mcg at 10/24/14 0941  . zolpidem (AMBIEN) tablet 5 mg  5 mg Oral QHS PRN Nicholes Mango, MD        Musculoskeletal: Strength & Muscle Tone: within normal limits Gait & Station: normal Patient leans: N/A  Psychiatric Specialty Exam: Physical Exam  Constitutional: She is oriented to person, place, and time. She appears well-developed and well-nourished.  HENT:  Head: Normocephalic.  Neck: Normal range of motion.  Respiratory: Effort normal.  Neurological: She is alert and oriented to person, place, and time.  Psychiatric: She has a normal mood and affect. Her speech is normal and behavior is normal. Judgment and thought content normal. Cognition and memory are normal.    Review of Systems  Psychiatric/Behavioral: Negative for depression, suicidal ideas, hallucinations, memory loss and substance abuse. The patient has insomnia. The patient is not nervous/anxious.     Blood pressure 143/64, pulse 82, temperature 98.2 F (36.8 C), temperature source Oral, resp. rate 19, height _0  (1.626 m), weight 86.728 kg (191 lb 3.2 oz), SpO2 96 %.Body mass index is 32.8 kg/(m^2).  General Appearance: Fairly Groomed  Engineer, water::  Good  Speech:  Clear and Coherent  Volume:  Normal  Mood:  Euthymic  Affect:  Appropriate  Thought Process:  Logical  Orientation:  Full (Time, Place, and Person)  Thought Content:  Negative  Suicidal Thoughts:  No  Homicidal Thoughts:  No  Memory:  Negative  Judgement:  Fair  Insight:  Fair  Psychomotor Activity:  Normal  Concentration:  Good  Recall:  Good  Fund of Knowledge:Good  Language: Good  Akathisia:  Negative  Handed:  Left  AIMS (if indicated):     Assets:  Desire for Improvement Financial  Resources/Insurance Housing Intimacy Social Support  ADL's:  Intact  Cognition: WNL  Sleep:      Medical Decision Making: Established Problem, Stable/Improving (1), Self-Limited or Minor (1), New problem, with additional work up planned, Review of Psycho-Social Stressors (1), Review or order clinical lab tests (1) and Review and summation of old records (2)  Treatment Plan Summary: Plan Patient appears to be currently asymptomatic. No indication for any specific treatment. No indication to add antidepressant medication or arrange for follow-up therapy. No  further consultation to be provided. Brief educational therapy completed.  Plan:  No evidence of imminent risk to self or others at present.   Patient does not meet criteria for psychiatric inpatient admission. Disposition: No further psychiatric care indicated. No indication for medication or psychiatric hospitalization. No need for psychiatric follow-up. Signing off of case.  Alethia Berthold 10/24/2014 5:49 PM

## 2014-10-24 NOTE — Progress Notes (Signed)
Feeling better in toe and hand with gout treatment. Toe has less erythema. Do not recommend chronic gout meds as this is rare for her.

## 2014-10-24 NOTE — Consult Note (Signed)
Cardiology Consultation Note  Patient ID: Jenna Marshall, MRN: [id number], DOB/AGE: 1925-10-09 81 y.o. Admit date: 10/22/2014   Date of Consult: 10/24/2014 Primary Physician: [name], MD Primary Cardiologist: Dr. Rockey Situ, MD  Chief Complaint: SOB Reason for Consult: Acute respiratory failure with hypoxia   HPI: 81 year old female with history of CAD s/p recent 4 vessel CABG on 11/21/3084, diastolic dysfunction, hypertension, and hyperlipidemia who was recently discharged from [facility] on 5/20 to [facility] who presented to [facility] on 5/21 with generalized weakness and was found to have acute respiratory failure with hypoxia and a UTI.   She was recently admitted to [facility] on 5/10 with a several week history of intermittent sub sternal chest pain and aching in the forearm muscles bilaterally that was occuring with exertion and at rest. She ruled out for MI. She had a high risk nuclear stress test (EF 41%) on 10/13/2014 as below and catheterization on 10/14/2014 showing 99% distal LM stenosis and significant LAD and RCA stenosis. An echo showed an LVEF of 57-84% and diastolic dysfunction.  She was transferred to [facility] on 5/13 for urgent CABG given the above cardia cath findings. She underwent a CABG x 4 on 10/15/2014. Successful LIMA-->LAD, SVG-->RCA, and sequential SVG to Ramus and OM. She remained afebrile and hemodynamically stable. She was not put on a beta blocker as she had labile BP and was bradycardic. She was started on Midodrine and this was decreased as tolerated. She was volume over loaded and diuresed. She had ABL anemia. She did not require a post op transfusion. Her last H and H was up to 9.4 and 27.7 at discharge. She was ambulating on 2 liters of oxygen via [place] and was weaned off prior to discharge.  She presented to [facility] on 5/21 with increased weakness and inability to get out of bed to ambulate. She denies having any chest pain with the above. She reports a stable weight,  discharge weight of 197, weight upon admission 194. Upon her arrival she was found to hypoxic with O2 sats in the high 80s. She was also noted to have a UTI with 3+ leukocytes, TNTC WBC and 6-30 RBC, positive nitrites. WBC 14.5. CXR showed moderate left pleural effusion and small to moderate sized right pleural effusion with bibasilar effusion with associated atelectasis. PNA cannot be excluded. Tropnon 0.06. She also noted right great toe pain, uric acid was found to be 5.9. WBC count has trended to 13.9. SCr 1.10. She was started on IV Rocephin for the UTI. She was encouraged to use her incentive spirometer though she reports she did not want to use this much. She reports feeling better today than the previous day.     Past Medical History  Diagnosis Date  . Macular degeneration   . Hyperlipidemia   . Hypertension   . Hypothyroidism   . Depression   . pernicious anemia   . Varicose veins   . CAD (coronary artery disease)     a. Lexi 10/13/14: mid ant to apical & inf wall ischemia w/ WMA, mild-mod dep EF; b. cath 10/14/14: ost LM to LM 50%, dLM 99%, ost LAD 95%, ost LAD 95%, pLAD-LAD 40%, pRCA 30%, mRCA 70%, Critical LM stenosis. Sig dRCA stenosis. Heavily calcified arteries; c. CABG x 4 (10/2014)  . Diastolic dysfunction     a. echo 10/2014: EF 55-60%, no RWMA, GR1DD, mild AI, trivial MR, mildly dilated LA, PASP normal      Most Recent Cardiac  Studies: Cardiac cath 10/14/2014:  Coronary Findings    Dominance: Right   Left Main   . Ost LM to LM lesion, 50% stenosed.   . LM lesion, 99% stenosed. severely calcified .     Left Anterior Descending   . Ost LAD lesion, 95% stenosed.   . Prox LAD to Mid LAD lesion, 40% stenosed.   . First Diagonal Branch   There is mild.   . Second Diagonal Branch   The vessel is small in size.   . Third Diagonal Branch   The vessel is small in size.     Ramus Intermedius  The vessel , is large . The vessel exhibits minimal luminal irregularities.      Left Circumflex  There is mildthe vessel.   . First Obtuse Marginal Branch   There is mild.   . Second Obtuse Marginal Branch   There is mild.     Right Coronary Artery   . Prox RCA lesion, 30% stenosed. severely calcified .   Marland Kitchen Mid RCA lesion, 70% stenosed. calcified .   Marland Kitchen Right Posterior Descending Artery   The vessel exhibits minimal luminal irregularities.   . Right Posterior Atrioventricular Branch   The vessel exhibits minimal luminal irregularities.   . First Right Posterolateral   The vessel exhibits minimal luminal irregularities.   . Second Right Posterolateral   The vessel exhibits minimal luminal irregularities      Echo 10/14/2014:  Study Conclusions  - Left ventricle: The cavity size was normal. There was moderate concentric hypertrophy. Systolic function was normal. The estimated ejection fraction was in the range of 55% to 60%. Wall motion was normal; there were no regional wall motion abnormalities. Doppler parameters are consistent with abnormal left ventricular relaxation (grade 1 diastolic dysfunction). - Aortic valve: There was mild regurgitation. Valve area (Vmax): 2.99 cm^2. - Mitral valve: There was trivial regurgitation. - Left atrium: The atrium was mildly dilated. - Pulmonary arteries: Systolic pressure was within the normal range.  Lexiscan 10/13/2014:   Pharmacological myocardial perfusion imaging study with moderate sized, moderate in severity ischemia noted in the mid to apical anterior wall and mid to apical lateral and inferolateral wall.  The left ventricular ejection fraction is moderately decreased (30-44%). EF estimated at 41%  Defect 1: There is a medium defect of moderate severity present in the basal inferolateral, mid anterior, mid inferolateral, mid anterolateral, apical anterior and apex location.  T wave inversion was noted during rest and stress in the inferior leads (aVF, II and III). No EKG changes  concerning for ischemia.  This is a high risk study   Surgical History:  Past Surgical History  Procedure Laterality Date  . Coronary artery bypass graft  10/2014    LIMA-->LAD, SVG-->RCA, sequential SVG-->Ramus and OM     Home Meds: Prior to Admission medications   Medication Sig Start Date End Date Taking? Authorizing Provider  acetaminophen (TYLENOL) 325 MG tablet Take 650 mg by mouth every 6 (six) hours as needed for mild pain.   Yes Historical Provider, MD  aspirin 325 MG tablet Take 325 mg by mouth daily.   Yes Historical Provider, MD  Calcium Carbonate-Vitamin D (CALCIUM + D) 600-200 MG-UNIT TABS Take 1 tablet by mouth 2 (two) times daily.    Yes Historical Provider, MD  Cyanocobalamin 1000 MCG SUBL Place 1 tablet (1,000 mcg total) under the tongue daily. 09/22/13  Yes [name], MD  furosemide (LASIX) 40 MG tablet Take 40 mg by  mouth daily.   Yes Historical Provider, MD  levothyroxine (SYNTHROID, LEVOTHROID) 25 MCG tablet TAKE 1 TABLET DAILY FOR THYROID 07/14/14  Yes [name], MD  midodrine (PROAMATINE) 5 MG tablet Take 5 mg by mouth 3 (three) times daily with meals.   Yes Historical Provider, MD  Multiple Vitamin (MULTIVITAMIN) tablet Take 1 tablet by mouth daily.     Yes Historical Provider, MD  multivitamin-lutein (OCUVITE-LUTEIN) CAPS capsule Take 1 capsule by mouth daily.   Yes Historical Provider, MD  oxybutynin (DITROPAN) 5 MG tablet Take 1 tablet (5 mg total) by mouth 3 (three) times daily. 03/30/14  Yes [name], MD  pantoprazole (PROTONIX) 40 MG tablet Take 1 tablet (40 mg total) by mouth daily. 09/29/14  Yes [name], MD  potassium chloride (MICRO-K) 10 MEQ CR capsule Take 10 mEq by mouth daily.   Yes Historical Provider, MD  simvastatin (ZOCOR) 40 MG tablet TAKE ONE TABLET BY MOUTH EVERY BEDTIME FOR CHOLESTEROL 08/31/14  Yes [name], MD  diclofenac (CATAFLAM) 50 MG tablet Take 1 tablet (50 mg total) by mouth 3 (three) times daily. 09/29/14    [name], MD  levothyroxine (SYNTHROID, LEVOTHROID) 25 MCG tablet TAKE 1 TABLET DAILY FOR THYROID 07/14/14   [name], MD  losartan (COZAAR) 100 MG tablet TAKE ONE (1) TABLET BY MOUTH EVERY DAY 04/21/14   [name], MD  mupirocin ointment (BACTROBAN) 2 % Place 1 application into the nose 2 (two) times daily. 06/09/14   Jearld Fenton, NP  nystatin (MYCOSTATIN) powder Apply topically 2 (two) times daily. 09/29/14   [name], MD  traMADol (ULTRAM) 50 MG tablet Take 1 tablet (50 mg total) by mouth every 6 (six) hours as needed for pain. 04/05/13   [name], MD    Inpatient Medications:  . aspirin  325 mg Oral Daily  . calcium-vitamin D  1 tablet Oral BID  . cefTRIAXone (ROCEPHIN)  IV  1 g Intravenous Q24H  . colchicine  0.6 mg Oral BID  . enoxaparin (LOVENOX) injection  40 mg Subcutaneous Q24H  . furosemide  40 mg Oral Daily  . I-VITE PROTECT  1 tablet Oral Q1200  . levothyroxine  25 mcg Oral QAC breakfast  . midodrine  5 mg Oral TID WC  . multivitamin  1 tablet Oral Daily  . oxybutynin  5 mg Oral TID  . pantoprazole  40 mg Oral Daily  . potassium chloride  10 mEq Oral Daily  . predniSONE  10 mg Oral Nightly  . predniSONE  5 mg Oral PC lunch  . predniSONE  5 mg Oral PC supper  . predniSONE  5 mg Oral 3 x daily with food  . [START ON 10/25/2014] predniSONE  5 mg Oral 4X daily taper  . simvastatin  40 mg Oral q1800  . vitamin B-12  1,000 mcg Oral Daily      Allergies:  Allergies  Allergen Reactions  . Codeine Nausea Only  . Codeine Sulfate Hives    History   Social History  . Marital Status: Widowed    Spouse Name: N/A  . Number of Children: N/A  . Years of Education: N/A   Occupational History  . Not on file.   Social History Main Topics  . Smoking status: Never Smoker   . Smokeless tobacco: Never Used  . Alcohol Use: No  . Drug Use: No  . Sexual Activity: No   Other Topics Concern  .  Not on file   Social History Narrative     Family  History  Problem Relation Age of Onset  . Diabetes Mother   . Heart disease Father      Review of Systems: Review of Systems  Constitutional: Positive for malaise/fatigue. Negative for fever, chills, weight loss and diaphoresis.  HENT: Negative for congestion, nosebleeds and sore throat.   Respiratory: Positive for shortness of breath. Negative for cough, hemoptysis, sputum production and wheezing.   Cardiovascular: Negative for chest pain, palpitations, orthopnea, claudication, leg swelling and PND.  Gastrointestinal: Negative for heartburn, nausea, vomiting, abdominal pain, diarrhea, constipation, blood in stool and melena.  Genitourinary: Positive for dysuria, urgency and frequency. Negative for hematuria and flank pain.  Musculoskeletal: Positive for joint pain. Negative for myalgias and falls.       Right great toe pain  Skin: Negative for itching and rash.  Neurological: Positive for weakness. Negative for dizziness, speech change, focal weakness and headaches.  Psychiatric/Behavioral: Negative for depression, hallucinations and substance abuse. The patient is not nervous/anxious.   All other systems were reviewed and negative.   Labs:  Recent Labs  10/22/14 1032  TROPONINI 0.06*   Lab Results  Component Value Date   WBC 13.9* 10/24/2014   HGB 9.7* 10/24/2014   HCT 28.5* 10/24/2014   MCV 94.8 10/24/2014   PLT 302 10/24/2014     Recent Labs Lab 10/23/14 0444  NA 135  K 3.9  CL 97*  CO2 32  BUN 26*  CREATININE 1.10*  CALCIUM 8.5*  GLUCOSE 126*   Lab Results  Component Value Date   CHOL 153 09/29/2014   HDL 50.50 09/29/2014   LDLCALC 80 09/29/2014   TRIG 113.0 09/29/2014   Lab Results  Component Value Date   DDIMER 0.27 09/02/2012    Radiology/Studies:  Dg Hand 2 View Right  10/23/2014   CLINICAL DATA:  Diffuse right hand pain with increased stiffness for the last few days. No reported injury.  EXAM: RIGHT HAND - 2 VIEW  COMPARISON:  None.  FINDINGS:  Degenerative spur formation on both sides of the first IP joint, third PIP joint and second and third DIP joints. There is also joint space narrowing and sclerosis at the articulations of the trapezium and trapezoid with the scaphoid.  IMPRESSION: Degenerative changes, as described above.  No acute abnormality.   Electronically Signed   By: Claudie Revering M.D.   On: 10/23/2014 12:24   Dg Chest Port 1 View  10/22/2014   CLINICAL DATA:  Status post CABG 5 days ago. Progressive weakness this morning.  EXAM: PORTABLE CHEST - 1 VIEW  COMPARISON:  None.  FINDINGS: Enlarged cardiac silhouette. Post CABG changes. Bibasilar pleural fluid and ill-defined opacity. Mildly prominent pulmonary vasculature. Unremarkable bones.  IMPRESSION: 1. Moderate-sized left pleural effusion and small to moderate-sized right pleural effusion with bibasilar probable associated atelectasis. Pneumonia cannot be excluded radiographically. 2. Cardiomegaly and mild pulmonary vascular congestion.   Electronically Signed   By: Claudie Revering M.D.   On: 10/22/2014 10:54   Dg Foot 2 Views Right  10/23/2014   CLINICAL DATA:  Pain and erythema involving the base of the great toe, acute onset.  EXAM: RIGHT FOOT - 2 VIEW  COMPARISON:  None.  FINDINGS: No evidence of acute or subacute fracture or dislocation. Well preserved joint spaces. Soft tissue calcification adjacent to the head of the 5th metatarsal. No associated erosions. Osseous demineralization. Small plantar calcaneal spur.  IMPRESSION: 1. No acute osseous  abnormality. 2. Soft tissue calcification adjacent to the head of the 5th metatarsal without associated erosions. These calcifications may be capsular or bursal in origin and represent chronic inflammation.   Electronically Signed   By: Evangeline Dakin M.D.   On: 10/23/2014 12:14    EKG: NSR, 92 bpm, anterior TWI  Weights: Filed Weights   10/22/14 1632 10/23/14 0457 10/24/14 0408  Weight: 189 lb 14.4 oz (86.138 kg) 189 lb 3.2 oz  (85.821 kg) 191 lb 3.2 oz (86.728 kg)     Physical Exam: Blood pressure 132/54, pulse 87, temperature 99.2 F (37.3 C), temperature source Oral, resp. rate 20, height 5\' 4"  (1.626 m), weight 191 lb 3.2 oz (86.728 kg), SpO2 93 %. Body mass index is 32.8 kg/(m^2). General: Well developed, well nourished, in no acute distress. Head: Normocephalic, atraumatic, sclera non-icteric, no xanthomas, nares are without discharge.  Neck: Negative for carotid bruits. JVD not elevated. Lungs: Bilateral crackles, left greater than right.No wheezes or rhonchi. Breathing is unlabored. On 2L via [place].  Heart: RRR with S1 S2. No murmurs, rubs, or gallops appreciated. Abdomen: Soft, non-tender, non-distended with normoactive bowel sounds. No hepatomegaly. No rebound/guarding. No obvious abdominal masses. Msk:  Strength and tone appear normal for age. Extremities: No clubbing or cyanosis. No edema.  Distal pedal pulses are 2+ and equal bilaterally. Neuro: Alert and oriented X 3. No facial asymmetry. No focal deficit. Moves all extremities spontaneously. Psych:  Responds to questions appropriately with a normal affect.    Assessment and Plan:  81 year old female with history of CAD s/p recent 4 vessel CABG on 9/48/0165, diastolic dysfunction, hypertension, and hyperlipidemia who was recently discharged from [facility] on 5/20 to [facility] who presented to [facility] on 5/21 with generalized weakness and was found to have acute respiratory failure with hypoxia and a UTI.  1. Acute respiratory failure with hypoxia: -Likely secondary to mild acute on chronic diastolic CHF, possible atelectasis  -Previous echo 10/14/2014 showed EF 55-60%, GR1DD, right sided pressures within normal range -Continue Lasix 40 mg daily, may need an extra 20 mg prn SOB  -Wean O2 as tolerated -Improving, she wants to get up out of bed today -Incentive spirometry  -Repeat CXR  2. History of CAD s/p recent 4 vessel CABG 10/15/2014: -No  angina -Continue aspirin 325 mg -Consider transition to Plavix as outpatient with decrease to aspirin 81 mg  -Change simvastatin to Lipitor 80 mg given LDL of 80 09/29/2014 -Add Coreg 3.125 mg bid  3. UTI: -On Rocephin per IM  4. Right great toe pain: -On prednisone for probable gout  Dispo: -Will need to work with PT prior to discharge, yet to get out of bed since admission     Gladstone Lighter Pager: [phone] 10/24/2014, 10:36 AM

## 2014-10-24 NOTE — Clinical Social Work Note (Signed)
Clinical Social Work Assessment  Patient Details  Name: Jenna Marshall MRN: [id number] Date of Birth: Jun 23, 1925  Date of referral:  10/24/14               Reason for consult:  Facility Placement                Permission sought to share information with:  Facility Sport and exercise psychologist, Family Supports Permission granted to share information::  Yes, Verbal Permission Granted  Name::        Agency::     Relationship::     Contact Information:     Housing/Transportation Living arrangements for the past 2 months:  Single Family Home Source of Information:  Patient, Adult Children Patient Interpreter Needed:  None Criminal Activity/Legal Involvement Pertinent to Current Situation/Hospitalization:  No - Comment as needed Significant Relationships:  Adult Children Lives with:  Self Do you feel safe going back to the place where you live?  No Need for family participation in patient care:  Yes (Comment)  Care giving concerns:  Pt's daughters expressed that they were concerned about the pt returning home and living alone.   Social Worker assessment / plan:  CSW spoke to the pt.  SHe was alert and Ox3.  She was aware of the situation and in agreement with going to SNF.  CSW explained that this placement was only temporary. Pt was still disappointed that she would not be going home after her hospital stay, but she was willing to go to rehab.  Pt's daughters also thought this would be better for the pt.    Employment status:  Retired Forensic scientist:  [employer] PT Recommendations:  [place] / Referral to community resources:     Patient/Family's Response to care:  Pt and family in agreement with DC to SNF  Patient/Family's Understanding of and Emotional Response to Diagnosis, Current Treatment, and Prognosis:  Pt and pt's family thanked CSW for speaking to them about placement and conducting bed search.  Emotional Assessment Appearance:  Appears stated  age Attitude/Demeanor/Rapport:  Other (dissapointed that she was unable to return home right away) Affect (typically observed):  Accepting Orientation:  Oriented to Self, Oriented to Place, Oriented to  Time, Oriented to Situation Alcohol / Substance use:    Psych involvement (Current and /or in the community):  No (Comment)  Discharge Needs  Concerns to be addressed:  Home Safety Concerns Readmission within the last 30 days:  No Current discharge risk:  Lives alone Barriers to Discharge:      Mathews Argyle, LCSW 10/24/2014, 3:50 PM

## 2014-10-24 NOTE — Progress Notes (Signed)
[facility] [place] NAME: Jenna Marshall    MR#:  [id number]  DATE OF BIRTH:  [birth year]  SUBJECTIVE:  CHIEF COMPLAINT:  Generalized weakness Patient is out of bed to chair today.  Right great toe pain with redness and right thumb pain are better. REVIEW OF SYSTEMS:  CONSTITUTIONAL: No fever, fatigue or reporting generalized weakness.  EYES: No blurred or double vision.  EARS, NOSE, AND THROAT: No tinnitus or ear pain.  RESPIRATORY: No cough, shortness of breath, wheezing or hemoptysis.  CARDIOVASCULAR: No chest pain, orthopnea, edema.  GASTROINTESTINAL: No nausea, vomiting, diarrhea or abdominal pain.  GENITOURINARY: No dysuria, hematuria.  ENDOCRINE: No polyuria, nocturia,  HEMATOLOGY: No anemia, easy bruising or bleeding SKIN: No rash or lesion. MUSCULOSKELETAL: Reporting right thumb, right toe pain and back pain has history of osteoarthritis  NEUROLOGIC: No tingling, numbness, weakness.  PSYCHIATRY: No anxiety ; has history of depression.   DRUG ALLERGIES:   Allergies  Allergen Reactions  . Codeine Nausea Only  . Codeine Sulfate Hives    VITALS:  Blood pressure 124/59, pulse 81, temperature 97.7 F (36.5 C), temperature source Oral, resp. rate 20, height 5\' 4"  (1.626 m), weight 86.728 kg (191 lb 3.2 oz), SpO2 95 %.  PHYSICAL EXAMINATION:  GENERAL:  81 y.o.-year-old patient lying in the bed with no acute distress.  EYES: Pupils equal, round, reactive to light and accommodation. No scleral icterus. Extraocular muscles intact.  HEENT: Head atraumatic, normocephalic. Oropharynx and nasopharynx clear.  NECK:  Supple, no jugular venous distention. No thyroid enlargement, no tenderness.  LUNGS: Normal breath sounds bilaterally, no wheezing, rales,rhonchi or crepitation. No use of accessory muscles of respiration.  CARDIOVASCULAR: S1, S2 normal. No murmurs, rubs, or gallops.  ABDOMEN: Soft, nontender, nondistended. Bowel sounds  present. No organomegaly or mass.  EXTREMITIES: Right great toe is erythematous and tender with minimal swelling. Right thumb is tender but no erythema noted. No pedal edema, cyanosis, or clubbing.  NEUROLOGIC: Cranial nerves II through XII are intact. Muscle strength 5/5 in all extremities. Sensation intact. Gait not checked.  PSYCHIATRIC: The patient is alert and oriented x 3.  SKIN: No obvious rash, lesion, or ulcer.    LABORATORY PANEL:   CBC  Recent Labs Lab 10/24/14 0558  WBC 13.9*  HGB 9.7*  HCT 28.5*  PLT 302   ------------------------------------------------------------------------------------------------------------------  Chemistries   Recent Labs Lab 10/23/14 0444  NA 135  K 3.9  CL 97*  CO2 32  GLUCOSE 126*  BUN 26*  CREATININE 1.10*  CALCIUM 8.5*   ------------------------------------------------------------------------------------------------------------------  Cardiac Enzymes  Recent Labs Lab 10/22/14 1032  TROPONINI 0.06*   ------------------------------------------------------------------------------------------------------------------  RADIOLOGY:  Dg Hand 2 View Right  10/23/2014   CLINICAL DATA:  Diffuse right hand pain with increased stiffness for the last few days. No reported injury.  EXAM: RIGHT HAND - 2 VIEW  COMPARISON:  None.  FINDINGS: Degenerative spur formation on both sides of the first IP joint, third PIP joint and second and third DIP joints. There is also joint space narrowing and sclerosis at the articulations of the trapezium and trapezoid with the scaphoid.  IMPRESSION: Degenerative changes, as described above.  No acute abnormality.   Electronically Signed   By: Claudie Revering M.D.   On: 10/23/2014 12:24   Dg Foot 2 Views Right  10/23/2014   CLINICAL DATA:  Pain and erythema involving the base of the great toe, acute onset.  EXAM: RIGHT FOOT - 2  VIEW  COMPARISON:  None.  FINDINGS: No evidence of acute or subacute fracture or  dislocation. Well preserved joint spaces. Soft tissue calcification adjacent to the head of the 5th metatarsal. No associated erosions. Osseous demineralization. Small plantar calcaneal spur.  IMPRESSION: 1. No acute osseous abnormality. 2. Soft tissue calcification adjacent to the head of the 5th metatarsal without associated erosions. These calcifications may be capsular or bursal in origin and represent chronic inflammation.   Electronically Signed   By: Evangeline Dakin M.D.   On: 10/23/2014 12:14    EKG:   Orders placed or performed during the hospital encounter of 10/22/14  . ED EKG (<59mins upon arrival to the ED)  . ED EKG (<40mins upon arrival to the ED)  . EKG 12-Lead  . EKG 12-Lead    ASSESSMENT AND PLAN:    81 year old female with past medical history of hypertension, hyperlipidemia, recent CABG, Depression, hypothyroidism, osteoarthritis, GERD, who presents to the hospital from a skilled nursing facility due to generalized weakness and also noted to be hypoxic. Hypoxia is improved with 2 L of oxygen via nasal cannula. Denies any shortness of breath today.  # acute respiratory failure with hypoxia - this is likely secondary to mild CHF with pleural effusions and posible PNA on cxr.  Clinically patient is not symptomatic with any complaints of shortness of breath.  -continue lasix n abx  continue incentive spirometry and monitor. Hypoxia is improved with 2 L of oxygen via nasal cannula.  Will consider thoracentesis prn   # urinary tract infection this is likely the cause of patient's generalized weakness and leukocytosis.  Continue IV antibiotics with ceftriaxone and follow urine cultures  # hypothyroidism continue Synthroid  # status post CABG - patient has no acute chest pain or any shortness of breath. We'll continue aspirin Lasix and statin. - She is followed by Dr. Fletcher Anon  # Right great toe pain- 2/2 gout.  Check uric acid level. Check right foot x-ray. - prednosine  and colchicine Appreciate ortho  Consult   #Right thumb pain-probably from osteoarthritis. Will provide pain management as needed basis. Get right hand x-ray as a reporting recent fall. With the consult is placed.  # Acute depression with past medical history of depression-consulted is placed to psychiatry. Patient denies any suicidal ideations or homicidal ideation.  # GERD continue Protonix    All the records are reviewed and case discussed with Care Management/Social Workerr. Management plans discussed with the patient, family and they are in agreement.  CODE STATUS: Full code  TOTAL TIME TAKING CARE OF THIS PATIENT: 40 minutes.   POSSIBLE D/C IN 2-3 DAYS, DEPENDING ON CLINICAL CONDITION.   Nicholes Mango M.D on 10/24/2014 at 9:27 PM  Between 7am to 6pm - Pager - [phone] After 6pm go to [email or web address] - password [email or web address] [facility]  Office  [phone]  CC: Primary care physician; [name], MD

## 2014-10-24 NOTE — Progress Notes (Signed)
Physical Therapy Treatment Patient Details Name: Jenna Marshall MRN: [id number] DOB: 1925-06-19 Today's Date: 10/24/2014    History of Present Illness Pt is an 81yo white female c PMH HTN, depression, and macular degeneration, who was recently DC from [facility] s/p CABG (on POD 9 per daughter at time of evaluation). Pt was at [facility] for one daty when she began to feel weak and malaised. Pt arrived at [facility] found to have UTI. Family has been most converned about weakness and poor appetit since surgery.     PT Comments    Pt demonstrates excellent improvement in bed mobility, transfers, and ambulation at this time. She still requires continual reminders regarding sternal precautions during bed mobility and transfers. Pt still requiring modA for bed mobility and minA for transfers and gait. She is able to perform limited ambulation in room today and get up to recliner. Plan to continue to progress mobility and work toward patient's goal of returning home. However, at this time pt will require SNF placement at discharge.    Follow Up Recommendations  SNF     Equipment Recommendations       Recommendations for Other Services       Precautions / Restrictions Precautions Precautions: Fall;Sternal Restrictions Weight Bearing Restrictions: No    Mobility  Bed Mobility Overal bed mobility: Needs Assistance Bed Mobility: Supine to Sit     Supine to sit: Mod assist     General bed mobility comments: Cues to prevent violation of sternal precautions. Instructions for log rolling. Pt has poor command follow  Transfers Overall transfer level: Needs assistance Equipment used: Rolling walker (2 wheeled) Transfers: Sit to/from Stand Sit to Stand: Min assist;+2 safety/equipment (To avoid violating sternal precautions)         General transfer comment: Pt advised to hug pillow during sit to stand transfer and not push from arms due to sternal  precautions  Ambulation/Gait Ambulation/Gait assistance: +2 physical assistance;Min assist Ambulation Distance (Feet): 25 Feet Assistive device: Rolling walker (2 wheeled) (For balance. No force through UE)     Gait velocity interpretation: <1.8 ft/sec, indicative of risk for recurrent falls General Gait Details: Decreased gait speed and step length. Pt is fairly steady but does have some latearl sway during gait.   Stairs            Wheelchair Mobility    Modified Rankin (Stroke Patients Only)       Balance Overall balance assessment: Independent         Standing balance support: Bilateral upper extremity supported (Posterior listing during stance supporting knees on bed) Standing balance-Leahy Scale: Fair                      Cognition Arousal/Alertness: Awake/alert Behavior During Therapy: WFL for tasks assessed/performed Overall Cognitive Status: Within Functional Limits for tasks assessed                      Exercises General Exercises - Lower Extremity Ankle Circles/Pumps: AROM;Both;15 reps;Supine Quad Sets: Both;15 reps;Supine;Strengthening Gluteal Sets: Strengthening;Both;15 reps;Supine Long Arc Quad: Strengthening;Both;15 reps;Seated Heel Slides: Strengthening;Both;15 reps;Supine (resisted extension) Hip ABduction/ADduction: Strengthening;Both;15 reps;Supine Straight Leg Raises: Strengthening;Both;15 reps;Supine Hip Flexion/Marching: 15 reps;Strengthening;Both;Seated    General Comments        Pertinent Vitals/Pain Pain Assessment: No/denies pain (Denies foot, back, or thumb pain at rest. Denies chest pain) Pain Intervention(s): Repositioned (Pt reports "twinge" of toe pain in standing)    Home Living  Prior Function            PT Goals (current goals can now be found in the care plan section) Acute Rehab PT Goals Patient Stated Goal: Get rid of back and foot pain. "I would like to go straight  back home if I can get strong enough" PT Goal Formulation: With patient Time For Goal Achievement: 11/06/14 Potential to Achieve Goals: Good Progress towards PT goals: Progressing toward goals    Frequency  Min 2X/week    PT Plan Current plan remains appropriate    Co-evaluation             End of Session Equipment Utilized During Treatment: Gait belt Activity Tolerance: Patient tolerated treatment well Patient left: in chair;with call bell/phone within reach;with chair alarm set;with family/visitor present     Time: 1020-1048 PT Time Calculation (min) (ACUTE ONLY): 28 min  Charges:  $Therapeutic Exercise: 23-37 mins                    G Codes:     Jenna Marshall PT, DPT   Jenna Marshall 10/24/2014, 11:36 AM

## 2014-10-24 NOTE — Progress Notes (Signed)
Initial Nutrition Assessment  DOCUMENTATION CODES:     INTERVENTION:   (Meals and Snacks: Cater to patient preferences)  NUTRITION DIAGNOSIS:  Inadequate oral intake related to acute illness as evidenced by pt/ family report of poor appetite x 1.5 weeks.    GOAL:  Patient will meet greater than or equal to 90% of their needs    MONITOR:   (Energy Intake, Electrolyte and Renal Profile, Anthropometrics)  REASON FOR ASSESSMENT:  Malnutrition Screening Tool    ASSESSMENT:  Reason For Admission: UTI PMHx: Past Medical History  Diagnosis Date  . Macular degeneration   . Hyperlipidemia   . Hypertension   . Hypothyroidism   . Depression   . pernicious anemia   . Varicose veins   . CAD (coronary artery disease)     a. Lexi 10/13/14: mid ant to apical & inf wall ischemia w/ WMA, mild-mod dep EF; b. cath 10/14/14: ost LM to LM 50%, dLM 99%, ost LAD 95%, ost LAD 95%, pLAD-LAD 40%, pRCA 30%, mRCA 70%, Critical LM stenosis. Sig dRCA stenosis. Heavily calcified arteries; c. CABG x 4 (10/2014)  . Diastolic dysfunction     a. echo 10/2014: EF 55-60%, no RWMA, GR1DD, mild AI, trivial MR, mildly dilated LA, PASP normal    Typical Fluid/ Food Intake: 100% of breakfast this AM; pt reports that her "appetite is coming back" Meal/ Snack Patterns: Patient reports eating well on a regular diet with no restrictions prior to admission, except for last 1.5 weeks where her appetite and intake were decreased. Supplements: None  Labs:  Electrolyte and Renal Profile:    Recent Labs Lab 10/22/14 1032 10/23/14 0444  BUN 22* 26*  CREATININE 1.05* 1.10*  NA 135 135  K 4.2 3.9   Meds: Lipitor, Lasix, Vit D, B-12  Physical Findings: n/a Weight Changes: Pt reports a UBW of 180# Current body weight is >100% of UBW.  Height:  Ht Readings from Last 1 Encounters:  10/22/14 5\' 4"  (1.626 m)    Weight:  Wt Readings from Last 1 Encounters:  10/24/14 191 lb 3.2 oz (86.728 kg)    Ideal  Body Weight:     Wt Readings from Last 10 Encounters:  10/24/14 191 lb 3.2 oz (86.728 kg)  09/29/14 193 lb (87.544 kg)  06/09/14 178 lb (80.74 kg)  03/30/14 179 lb 12 oz (81.534 kg)  09/22/13 189 lb (85.73 kg)  04/05/13 191 lb 8 oz (86.864 kg)  03/10/13 191 lb 8 oz (86.864 kg)  11/13/12 198 lb (89.812 kg)  11/04/12 189 lb 12 oz (86.07 kg)  09/02/12 196 lb (88.905 kg)    BMI:  Body mass index is 32.8 kg/(m^2).  Skin:  Reviewed, no issues  Diet Order:  Diet Heart Room service appropriate?: Yes; Fluid consistency:: Thin  EDUCATION NEEDS:  Education needs no appropriate at this time   Intake/Output Summary (Last 24 hours) at 10/24/14 1545 Last data filed at 10/24/14 1011  Gross per 24 hour  Intake    240 ml  Output      0 ml  Net    240 ml    Last BM:  5/22  Roda Shutters, RDN Pager: [phone] Office: [place]

## 2014-10-24 NOTE — Clinical Social Work Placement (Addendum)
   CLINICAL SOCIAL WORK PLACEMENT  NOTE  Date:  10/24/2014  Patient Details  Name: Jenna Marshall MRN: [id number] Date of Birth: 08-12-1925  Clinical Social Work is seeking post-discharge placement for this patient at the [place] level of care (*CSW will initial, date and re-position this form in  chart as items are completed):  Yes   Patient/family provided with [place] Work Department's list of facilities offering this level of care within the geographic area requested by the patient (or if unable, by the patient's family).  Yes   Patient/family informed of their freedom to choose among providers that offer the needed level of care, that participate in Medicare, Medicaid or managed care program needed by the patient, have an available bed and are willing to accept the patient.  Yes   Patient/family informed of [place] [facility]'s ownership interest in [facility] and [facility], as well as of the fact that they are under no obligation to receive care at these facilities.  PASRR submitted to EDS on       PASRR number received on       Existing PASRR number confirmed on 10/24/14     FL2 transmitted to all facilities in geographic area requested by pt/family on 10/17/14     FL2 transmitted to all facilities within larger geographic area on       Patient informed that his/her managed care company has contracts with or will negotiate with certain facilities, including the following:      Yes pt was given SNF packet with this info.      Patient/family informed of bed offers received. Yes  Patient chooses bed at     [facility].  Physician recommends and patient chooses bed at      Patient to be transferred to   on  .  Patient to be transferred to facility by       Patient family notified on   of transfer.  Name of family member notified:        PHYSICIAN Please sign FL2     Additional Comment:   SNF facility is [facility]    _______________________________________________ Mathews Argyle, LCSW 10/24/2014, 3:54 PM

## 2014-10-25 LAB — BASIC METABOLIC PANEL
Anion gap: 10 (ref 5–15)
BUN: 31 mg/dL — AB (ref 6–20)
CALCIUM: 8.6 mg/dL — AB (ref 8.9–10.3)
CO2: 29 mmol/L (ref 22–32)
CREATININE: 1.05 mg/dL — AB (ref 0.44–1.00)
Chloride: 96 mmol/L — ABNORMAL LOW (ref 101–111)
GFR calc Af Amer: 54 mL/min — ABNORMAL LOW (ref >60)
GFR, EST NON AFRICAN AMERICAN: 47 mL/min — AB (ref >60)
Glucose, Bld: 144 mg/dL — ABNORMAL HIGH (ref 65–99)
Potassium: 4.3 mmol/L (ref 3.5–5.1)
Sodium: 135 mmol/L (ref 135–145)

## 2014-10-25 MED ORDER — CIPROFLOXACIN HCL 500 MG PO TABS
500.0000 mg | ORAL_TABLET | Freq: Two times a day (BID) | ORAL | Status: DC
  Administered 2014-10-25 – 2014-10-26 (×2): 500 mg via ORAL
  Filled 2014-10-25 (×2): qty 1

## 2014-10-25 NOTE — Consult Note (Signed)
   [facility] CM Inpatient Consult   10/25/2014  Jenna Marshall 09-24-1925 [id number] Received information in the conference call with [facility] that this member has admitted to [facility] left a voicemail message to [facility], [facility].  For questions, please contact: Natividad Brood, RN BSN [facility]  [phone] business mobile phone

## 2014-10-25 NOTE — Progress Notes (Signed)
[facility] [place] NAME: Zakyia Gagan    MR#:  [id number]  DATE OF BIRTH:  09/07/1925  SUBJECTIVE:  CHIEF COMPLAINT:  Generalized weakness Patient is out of bed to chair today.  Feeling fine  REVIEW OF SYSTEMS:  CONSTITUTIONAL: No fever, fatigue or reporting generalized weakness.  EYES: No blurred or double vision.  EARS, NOSE, AND THROAT: No tinnitus or ear pain.  RESPIRATORY: No cough, shortness of breath, wheezing or hemoptysis.  CARDIOVASCULAR: No chest pain, orthopnea, edema.  GASTROINTESTINAL: No nausea, vomiting, diarrhea or abdominal pain.  GENITOURINARY: No dysuria, hematuria.  ENDOCRINE: No polyuria, nocturia,  HEMATOLOGY: No anemia, easy bruising or bleeding SKIN: No rash or lesion. MUSCULOSKELETAL: Reporting right thumb, right toe pain and back pain has history of osteoarthritis  NEUROLOGIC: No tingling, numbness, weakness.  PSYCHIATRY: No anxiety ; has history of depression.   DRUG ALLERGIES:   Allergies  Allergen Reactions  . Codeine Nausea Only  . Codeine Sulfate Hives    VITALS:  Blood pressure 129/59, pulse 66, temperature 97.5 F (36.4 C), temperature source Oral, resp. rate 20, height 5\' 4"  (1.626 m), weight 89.236 kg (196 lb 11.7 oz), SpO2 95 %.  PHYSICAL EXAMINATION:  GENERAL:  81 y.o.-year-old patient lying in the bed with no acute distress.  EYES: Pupils equal, round, reactive to light and accommodation. No scleral icterus. Extraocular muscles intact.  HEENT: Head atraumatic, normocephalic. Oropharynx and nasopharynx clear.  NECK:  Supple, no jugular venous distention. No thyroid enlargement, no tenderness.  LUNGS: Normal breath sounds bilaterally, no wheezing, rales,rhonchi or crepitation. No use of accessory muscles of respiration.  CARDIOVASCULAR: S1, S2 normal. No murmurs, rubs, or gallops.  ABDOMEN: Soft, nontender, nondistended. Bowel sounds present. No organomegaly or mass.  EXTREMITIES: Right  great toe is erythematous and tender with minimal swelling. Right thumb is tender but no erythema noted. No pedal edema, cyanosis, or clubbing.  NEUROLOGIC: Cranial nerves II through XII are intact. Muscle strength 5/5 in all extremities. Sensation intact. Gait not checked.  PSYCHIATRIC: The patient is alert and oriented x 3.  SKIN: No obvious rash, lesion, or ulcer.    LABORATORY PANEL:   CBC  Recent Labs Lab 10/24/14 0558  WBC 13.9*  HGB 9.7*  HCT 28.5*  PLT 302   ------------------------------------------------------------------------------------------------------------------  Chemistries   Recent Labs Lab 10/25/14 0513  NA 135  K 4.3  CL 96*  CO2 29  GLUCOSE 144*  BUN 31*  CREATININE 1.05*  CALCIUM 8.6*   ------------------------------------------------------------------------------------------------------------------  Cardiac Enzymes  Recent Labs Lab 10/22/14 1032  TROPONINI 0.06*   ------------------------------------------------------------------------------------------------------------------  RADIOLOGY:  No results found.  EKG:   Orders placed or performed during the hospital encounter of 10/22/14  . ED EKG (<93mins upon arrival to the ED)  . ED EKG (<77mins upon arrival to the ED)  . EKG 12-Lead  . EKG 12-Lead    ASSESSMENT AND PLAN:    81 year old female with past medical history of hypertension, hyperlipidemia, recent CABG, Depression, hypothyroidism, osteoarthritis, GERD, who presents to the hospital from a skilled nursing facility due to generalized weakness and also noted to be hypoxic. Hypoxia is improved with 2 L of oxygen via nasal cannula. Denies any shortness of breath today.  # acute respiratory failure with hypoxia - this is likely secondary to mild CHF with pleural effusions and posible PNA on cxr.  Clinically patient is not symptomatic with any complaints of shortness of breath.  -continue lasix n abx  continue incentive  spirometry and monitor. Hypoxia is improved with 2 L of oxygen via nasal cannula.  Will consider thoracentesis prn   # urinary tract infection this is  the cause of patient's generalized weakness and leukocytosis.  cx with psudomonas  Sensitive to cipro and ecoli Continue IV antibiotics with ceftriaxone , added cipro and follow urine culture sensitivity Cbc am  # hypothyroidism continue Synthroid  # status post CABG - patient has no acute chest pain or any shortness of breath. We'll continue aspirin Lasix and statin. - She is followed by Dr. Fletcher Anon  # Right great toe pain- 2/2 gout.  Check uric acid level. Check right foot x-ray. - prednosine and colchicine Appreciate ortho  Consult   #Right thumb pain-probably from osteoarthritis. Will provide pain management as needed basis. Get right hand x-ray as a reporting recent fall. With the consult is placed.  # Acute depression with past medical history of depression- psychiatry with no recommendations. Patient denies any suicidal ideations or homicidal ideation.  # GERD continue Protonix    All the records are reviewed and case discussed with Care Management/Social Workerr. Management plans discussed with the patient, family and they are in agreement.  CODE STATUS: Full code  TOTAL TIME TAKING CARE OF THIS PATIENT: 40 minutes.   POSSIBLE D/C IN am  DAYS, DEPENDING ON CLINICAL CONDITION.   Nicholes Mango M.D on 10/25/2014 at 9:24 PM  Between 7am to 6pm - Pager - [phone] After 6pm go to [email or web address] - password [email or web address]  Office  [phone]  CC: Primary care physician; [name], MD

## 2014-10-25 NOTE — Progress Notes (Signed)
Physical Therapy Treatment Patient Details Name: Jenna Marshall MRN: [id number] DOB: [birth year] Today's Date: 10/25/2014    History of Present Illness Pt is an 81yo white female c PMH HTN, depression, and macular degeneration, who was recently DC from [facility] s/p CABG (on POD 9 per daughter). Pt was at [facility] for one daty when she began to feel weak and malaised. Pt arrived at [facility] found to have UTI. Family has been most converned about weakness and poor appetit since surgery.     PT Comments    Pt aware of need to protect sternum incision, but requires cues for sequencing. Improved performance with cueing and increasing confidence with repeated practice. Discussion with nursing and pt's daughter regarding improved performance and O2 levels today.   Follow Up Recommendations  SNF     Equipment Recommendations       Recommendations for Other Services       Precautions / Restrictions Restrictions Weight Bearing Restrictions: No    Mobility  Bed Mobility Overal bed mobility: Needs Assistance Bed Mobility: Supine to Sit     Supine to sit: Min assist     General bed mobility comments:  (Cues for sequencing and to reposition upward in bed/sternal )precautions for all bed mobility  Transfers Overall transfer level: Needs assistance Equipment used: Rolling walker (2 wheeled) Transfers: Sit to/from Stand Sit to Stand: Min assist (cues for hand placement. Performed 2x)         General transfer comment: Much improved stand and confidence on second stand requiring only Min guard  Ambulation/Gait Ambulation/Gait assistance: Min guard Ambulation Distance (Feet): 40 Feet (24ft 2x) Assistive device: Rolling walker (2 wheeled) Gait Pattern/deviations: Step-through pattern Gait velocity: Reduced Gait velocity interpretation: <1.8 ft/sec, indicative of risk for recurrent falls     Stairs            Wheelchair Mobility    Modified Rankin (Stroke Patients  Only)       Balance                                    Cognition Arousal/Alertness: Awake/alert Behavior During Therapy: WFL for tasks assessed/performed Overall Cognitive Status: Within Functional Limits for tasks assessed                      Exercises General Exercises - Lower Extremity Quad Sets: Strengthening;Both;20 reps;Supine Gluteal Sets: Strengthening;Both;20 reps;Supine Long Arc Quad: Both;20 reps;Seated;AROM Hip Flexion/Marching: AROM;Both;20 reps;Seated Toe Raises: AROM;Both;20 reps;Seated Heel Raises: AROM;20 reps;Both;Seated    General Comments        Pertinent Vitals/Pain Pain Assessment: No/denies pain    Home Living                      Prior Function            PT Goals (current goals can now be found in the care plan section) Progress towards PT goals: Progressing toward goals    Frequency  Min 2X/week    PT Plan Current plan remains appropriate    Marshall-evaluation             End of Session Equipment Utilized During Treatment: Gait belt Activity Tolerance: Patient tolerated treatment well;No increased pain;Patient limited by fatigue (Requires rest breaks ) Patient left: in bed;with call bell/phone within reach;with bed alarm set;with family/visitor present     Time: 7026-3785 PT Time Calculation (  min) (ACUTE ONLY): 45 min  Charges:  $Gait Training: 8-22 mins $Therapeutic Exercise: 8-22 mins $Therapeutic Activity: 8-22 mins                    G Codes:      Charlaine Dalton 10/25/2014, 2:19 PM

## 2014-10-26 ENCOUNTER — Telehealth: Payer: Self-pay

## 2014-10-26 DIAGNOSIS — E785 Hyperlipidemia, unspecified: Secondary | ICD-10-CM | POA: Diagnosis not present

## 2014-10-26 DIAGNOSIS — R531 Weakness: Secondary | ICD-10-CM | POA: Diagnosis not present

## 2014-10-26 DIAGNOSIS — M10471 Other secondary gout, right ankle and foot: Secondary | ICD-10-CM | POA: Diagnosis not present

## 2014-10-26 DIAGNOSIS — B9620 Unspecified Escherichia coli [E. coli] as the cause of diseases classified elsewhere: Secondary | ICD-10-CM | POA: Diagnosis not present

## 2014-10-26 DIAGNOSIS — E039 Hypothyroidism, unspecified: Secondary | ICD-10-CM | POA: Diagnosis not present

## 2014-10-26 DIAGNOSIS — M6281 Muscle weakness (generalized): Secondary | ICD-10-CM | POA: Diagnosis not present

## 2014-10-26 DIAGNOSIS — Z951 Presence of aortocoronary bypass graft: Secondary | ICD-10-CM | POA: Diagnosis not present

## 2014-10-26 DIAGNOSIS — D72829 Elevated white blood cell count, unspecified: Secondary | ICD-10-CM | POA: Diagnosis not present

## 2014-10-26 DIAGNOSIS — R0902 Hypoxemia: Secondary | ICD-10-CM | POA: Diagnosis not present

## 2014-10-26 DIAGNOSIS — M109 Gout, unspecified: Secondary | ICD-10-CM | POA: Diagnosis not present

## 2014-10-26 DIAGNOSIS — F4321 Adjustment disorder with depressed mood: Secondary | ICD-10-CM | POA: Diagnosis not present

## 2014-10-26 DIAGNOSIS — Z9981 Dependence on supplemental oxygen: Secondary | ICD-10-CM | POA: Diagnosis not present

## 2014-10-26 DIAGNOSIS — N390 Urinary tract infection, site not specified: Secondary | ICD-10-CM | POA: Diagnosis not present

## 2014-10-26 DIAGNOSIS — N3000 Acute cystitis without hematuria: Secondary | ICD-10-CM | POA: Diagnosis not present

## 2014-10-26 DIAGNOSIS — I10 Essential (primary) hypertension: Secondary | ICD-10-CM | POA: Diagnosis not present

## 2014-10-26 DIAGNOSIS — I2510 Atherosclerotic heart disease of native coronary artery without angina pectoris: Secondary | ICD-10-CM | POA: Diagnosis not present

## 2014-10-26 DIAGNOSIS — B965 Pseudomonas (aeruginosa) (mallei) (pseudomallei) as the cause of diseases classified elsewhere: Secondary | ICD-10-CM | POA: Diagnosis not present

## 2014-10-26 LAB — URINE CULTURE: Culture: >=100000

## 2014-10-26 LAB — CBC
HCT: 29.5 % — ABNORMAL LOW (ref 35.0–47.0)
Hemoglobin: 9.9 g/dL — ABNORMAL LOW (ref 12.0–16.0)
MCH: 31.7 pg (ref 26.0–34.0)
MCHC: 33.6 g/dL (ref 32.0–36.0)
MCV: 94.4 fL (ref 80.0–100.0)
Platelets: 344 10*3/uL (ref 150–440)
RBC: 3.12 MIL/uL — ABNORMAL LOW (ref 3.80–5.20)
RDW: 13.7 % (ref 11.5–14.5)
WBC: 13.8 10*3/uL — ABNORMAL HIGH (ref 3.6–11.0)

## 2014-10-26 MED ORDER — PREDNISONE 20 MG PO TABS: 20.0000 mg | ORAL_TABLET | Freq: Every day | ORAL | Status: DC

## 2014-10-26 MED ORDER — ATORVASTATIN CALCIUM 80 MG PO TABS: 80.0000 mg | ORAL_TABLET | Freq: Every day | ORAL | Status: DC

## 2014-10-26 MED ORDER — TRAMADOL HCL 50 MG PO TABS: 50.0000 mg | ORAL_TABLET | Freq: Four times a day (QID) | ORAL | Status: DC | PRN

## 2014-10-26 MED ORDER — COLCHICINE 0.6 MG PO TABS: 0.6000 mg | ORAL_TABLET | Freq: Two times a day (BID) | ORAL | Status: DC

## 2014-10-26 MED ORDER — CIPROFLOXACIN HCL 500 MG PO TABS: 500.0000 mg | ORAL_TABLET | Freq: Two times a day (BID) | ORAL | Status: AC

## 2014-10-26 MED ORDER — CARVEDILOL 3.125 MG PO TABS: 3.1250 mg | ORAL_TABLET | Freq: Two times a day (BID) | ORAL | Status: DC

## 2014-10-26 NOTE — Progress Notes (Signed)
Patient: Jenna Marshall / Admit Date: 10/22/2014 / Date of Encounter: 10/26/2014, 1:47 PM   Subjective: Feeling much better today. Feels like a "spring chicken." Has ambulated with PT up and down the halls without SOB or chest pain. Off O2, on room air. Has diuresed 1846 mL for the admission. Urine culture growing Pseudomonas, sensitive to Cipro.    Review of Systems: Review of Systems  Constitutional: Positive for weight loss. Negative for fever, chills, malaise/fatigue and diaphoresis.  HENT: Negative for congestion, nosebleeds and sore throat.   Eyes: Negative for discharge and redness.  Respiratory: Negative for cough, hemoptysis, sputum production, shortness of breath and wheezing.   Cardiovascular: Negative for chest pain, palpitations, orthopnea, claudication, leg swelling and PND.  Gastrointestinal: Negative for heartburn, nausea, vomiting, abdominal pain, diarrhea, blood in stool and melena.  Genitourinary: Negative for dysuria, urgency, frequency, hematuria and flank pain.  Skin: Negative for rash.  Neurological: Negative for focal weakness and weakness.  Psychiatric/Behavioral: The patient is not nervous/anxious.   All other systems reviewed and are negative.   Objective: Telemetry: NSR, 80's Physical Exam: Blood pressure 120/57, pulse 65, temperature 98.2 F (36.8 C), temperature source Oral, resp. rate 18, height 5\' 4"  (1.626 m), weight 188 lb 12.8 oz (85.639 kg), SpO2 95 %. Body mass index is 32.39 kg/(m^2). General: Well developed, well nourished, in no acute distress. Head: Normocephalic, atraumatic, sclera non-icteric, no xanthomas, nares are without discharge. Neck: Negative for carotid bruits. JVP not elevated. Lungs: Clear bilaterally to auscultation without wheezes, rales, or rhonchi. Breathing is unlabored. Heart: RRR, normal S1 S2, no murmurs, rubs or gallops. Well healed surgical scar. 3 sutures lower anterior chest wall.  Abdomen: Soft, non-tender,  non-distended with normoactive bowel sounds. No rebound/guarding. Extremities: No clubbing or cyanosis. No edema.  Neuro: Alert and oriented X 3. Moves all extremities spontaneously. Psych:  Responds to questions appropriately with a normal affect.   Intake/Output Summary (Last 24 hours) at 10/26/14 1347 Last data filed at 10/26/14 0845  Gross per 24 hour  Intake    240 ml  Output   1850 ml  Net  -1610 ml    Inpatient Medications:  . aspirin  325 mg Oral Daily  . atorvastatin  80 mg Oral q1800  . azithromycin  250 mg Oral Daily  . calcium-vitamin D  1 tablet Oral BID  . carvedilol  3.125 mg Oral BID WC  . cefTRIAXone (ROCEPHIN)  IV  1 g Intravenous Q24H  . ciprofloxacin  500 mg Oral BID  . colchicine  0.6 mg Oral BID  . enoxaparin (LOVENOX) injection  40 mg Subcutaneous Q24H  . furosemide  40 mg Oral Daily  . I-VITE PROTECT  1 tablet Oral Q1200  . levothyroxine  25 mcg Oral QAC breakfast  . multivitamin  1 tablet Oral Daily  . oxybutynin  5 mg Oral TID  . pantoprazole  40 mg Oral Daily  . potassium chloride  10 mEq Oral Daily  . predniSONE  5 mg Oral 4X daily taper  . vitamin B-12  1,000 mcg Oral Daily   Infusions:    Labs:  Recent Labs  10/25/14 0513  NA 135  K 4.3  CL 96*  CO2 29  GLUCOSE 144*  BUN 31*  CREATININE 1.05*  CALCIUM 8.6*   No results for input(s): AST, ALT, ALKPHOS, BILITOT, PROT, ALBUMIN in the last 72 hours.  Recent Labs  10/24/14 0558 10/26/14 0501  WBC 13.9* 13.8*  HGB 9.7* 9.9*  HCT 28.5* 29.5*  MCV 94.8 94.4  PLT 302 344   No results for input(s): CKTOTAL, CKMB, TROPONINI in the last 72 hours. Invalid input(s): POCBNP No results for input(s): HGBA1C in the last 72 hours.   Weights: Filed Weights   10/24/14 0408 10/25/14 0636 10/26/14 0639  Weight: 191 lb 3.2 oz (86.728 kg) 196 lb 11.7 oz (89.236 kg) 188 lb 12.8 oz (85.639 kg)     Radiology/Studies:  Dg Hand 2 View Right  10/23/2014   CLINICAL DATA:  Diffuse right hand  pain with increased stiffness for the last few days. No reported injury.  EXAM: RIGHT HAND - 2 VIEW  COMPARISON:  None.  FINDINGS: Degenerative spur formation on both sides of the first IP joint, third PIP joint and second and third DIP joints. There is also joint space narrowing and sclerosis at the articulations of the trapezium and trapezoid with the scaphoid.  IMPRESSION: Degenerative changes, as described above.  No acute abnormality.   Electronically Signed   By: Claudie Revering M.D.   On: 10/23/2014 12:24   Dg Chest Port 1 View  10/22/2014   CLINICAL DATA:  Status post CABG 5 days ago. Progressive weakness this morning.  EXAM: PORTABLE CHEST - 1 VIEW  COMPARISON:  None.  FINDINGS: Enlarged cardiac silhouette. Post CABG changes. Bibasilar pleural fluid and ill-defined opacity. Mildly prominent pulmonary vasculature. Unremarkable bones.  IMPRESSION: 1. Moderate-sized left pleural effusion and small to moderate-sized right pleural effusion with bibasilar probable associated atelectasis. Pneumonia cannot be excluded radiographically. 2. Cardiomegaly and mild pulmonary vascular congestion.   Electronically Signed   By: Claudie Revering M.D.   On: 10/22/2014 10:54   Dg Foot 2 Views Right  10/23/2014   CLINICAL DATA:  Pain and erythema involving the base of the great toe, acute onset.  EXAM: RIGHT FOOT - 2 VIEW  COMPARISON:  None.  FINDINGS: No evidence of acute or subacute fracture or dislocation. Well preserved joint spaces. Soft tissue calcification adjacent to the head of the 5th metatarsal. No associated erosions. Osseous demineralization. Small plantar calcaneal spur.  IMPRESSION: 1. No acute osseous abnormality. 2. Soft tissue calcification adjacent to the head of the 5th metatarsal without associated erosions. These calcifications may be capsular or bursal in origin and represent chronic inflammation.   Electronically Signed   By: Evangeline Dakin M.D.   On: 10/23/2014 12:14     Assessment and Plan  81  year old female with history of CAD s/p recent 4 vessel CABG on 0/17/5102, diastolic dysfunction, hypertension, and hyperlipidemia who was recently discharged from [facility] on 5/20 to [facility] who presented to [facility] on 5/21 with generalized weakness and was found to have acute respiratory failure with hypoxia and a UTI.  1. Acute respiratory failure with hypoxia: -Resolved -Initial presentation was likely secondary to mild acute on chronic diastolic CHF and left sided pleural effusion related to CABG  -Previous echo 10/14/2014 showed EF 55-60%, GR1DD, right sided pressures within normal range -She diuresed 1846 mL on Lasix 40 mg daily for the admission with significant improvement in symptoms, a thoracentesis was considered if symptoms persisted, though this was not required given her significant improvement  -Continue Lasix 40 mg daily, may need an extra 20 mg prn SOB  -O2 weaned successfully  -Incentive spirometry   2. History of CAD s/p recent 4 vessel CABG 10/15/2014: -No angina -Continue aspirin 325 mg -Consider transition to Plavix as outpatient with decrease to aspirin 81 mg  -Change simvastatin  to Lipitor 80 mg given LDL of 80 09/29/2014 -Add Coreg 3.125 mg bid -She missed her suture removal appointment with [facility] on 5/24. I called [facility] office and spoke with their nurse who advised me if wound look good we are ok to remove sutures  3. Acute on chronic diastolic CHF: -Per #1  4. UTI: -Culture with Pseudomonas  -On Cipro per IM  5. History of hypotension: -Resolved -Midodrine was discontinued   6. Right great toe pain: -On prednisone for probable gout  Dispo: -She will need follow up with [facility] and [facility]    Signed, Christell Faith, PA-C Pager: [phone] 10/26/2014, 1:47 PM

## 2014-10-26 NOTE — Progress Notes (Signed)
Report called to Myer Haff, receiving RN at [facility].  IV and telemetry discontinued per policy and procedure.  VSS, pt in NAD, skin warm and dry, pt denies any pain or discomfort at this time.  Awaiting EMS transport.

## 2014-10-26 NOTE — Clinical Social Work Note (Signed)
CSW notified pt, pt's family, facility, and RN that pt would DC to [facility] today via EMS.  CSW signing off.

## 2014-10-26 NOTE — Discharge Summary (Signed)
[facility] NAME: Jenna Marshall    MR#:  [id number]  DATE OF BIRTH:  16-Dec-1925  DATE OF ADMISSION:  10/22/2014 ADMITTING PHYSICIAN: Henreitta Leber, MD  DATE OF DISCHARGE: 10/26/2014 PRIMARY CARE PHYSICIAN: [name], MD    ADMISSION DIAGNOSIS:  Hypoxia [R09.02] Urinary tract infection with hematuria, site unspecified [N39.0, R31.9]  DISCHARGE DIAGNOSIS:   Acute cystitis without hematuria secondary to Pseudomonas and Escherichia coli Probable pneumonia Acute gouty arthritis Adjustment disorder Generalized weakness  SECONDARY DIAGNOSIS:   Past Medical History  Diagnosis Date  . Macular degeneration   . Hyperlipidemia   . Hypertension   . Hypothyroidism   . Depression   . pernicious anemia   . Varicose veins   . CAD (coronary artery disease)     a. Lexi 10/13/14: mid ant to apical & inf wall ischemia w/ WMA, mild-mod dep EF; b. cath 10/14/14: ost LM to LM 50%, dLM 99%, ost LAD 95%, ost LAD 95%, pLAD-LAD 40%, pRCA 30%, mRCA 70%, Critical LM stenosis. Sig dRCA stenosis. Heavily calcified arteries; c. CABG x 4 (10/2014)  . Diastolic dysfunction     a. echo 10/2014: EF 55-60%, no RWMA, GR1DD, mild AI, trivial MR, mildly dilated LA, PASP normal    HOSPITAL COURSE:  Brief history and physical Patient is brought into the ED with a chief complaint of generalized weakness. Patient was noted to be hypoxic with leukocytosis. She was admitted to the hospital for acute cystitis. Please review history and physical for details.  Hospital course  # acute respiratory failure with hypoxia - this is likely secondary to mild CHF with pleural effusions and posible PNA on cxr. Clinically patient is not symptomatic with any complaints of shortness of breath.  -continue lasix n abx continue incentive spirometry and monitor. Hypoxia is improved with 2 L of oxygen via nasal cannula, weaned off to room air Patient was given IV  Rocephin and azithromycin, clinical situation improved. Discharging home with by mouth ciprofloxacin for 10 days   # urinary tract infection this is likely the cause of patient's generalized weakness and leukocytosis. Received IV antibiotics with ceftriaxone and  urine cultures revealed Pseudomonas and Escherichia coli both are sensitive to ciprofloxacin. Will discharge her home with ciprofloxacin for 10 days  # hypothyroidism continue Synthroid  # status post CABG - patient has no acute chest pain or any shortness of breath. We'll continue aspirin Lasix and statin. - She is followed by Dr. Fletcher Anon Outpatient follow-up with the cardiology group in a week. Needs staple removal in one to 2 days  # Right great toe pain- 2/2 gout.   right foot x-ray with arthritis changes - prednosine and colchicine Appreciate ortho Consult   #Right thumb pain-probably from osteoarthritis. Will provide pain management as needed basis. Get r x-ray with no fractures, continue physical therapy at facility   # Acute depression with past medical history of depression- Psychiatry thinks this is an adjustment reaction. Not recommending any new medications at this time  # GERD continue Protonix  #Generalized weakness-status post PT evaluation who has recommended skilled nursing care. Patient is getting discharged to skilled nursing facility on under stable condition    DISCHARGE CONDITIONS:   Satisfactory  CONSULTS OBTAINED:  Treatment Team:  Hessie Knows, MD Wellington Hampshire, MD Gonzella Lex, MD Minna Merritts, MD   PROCEDURES none  DRUG ALLERGIES:   Allergies  Allergen Reactions  . Codeine Nausea Only  .  Codeine Sulfate Hives    DISCHARGE MEDICATIONS:   Current Discharge Medication List    START taking these medications   Details  atorvastatin (LIPITOR) 80 MG tablet Take 1 tablet (80 mg total) by mouth daily at 6 PM. Qty: 30 tablet, Refills: 0    carvedilol (COREG) 3.125 MG tablet  Take 1 tablet (3.125 mg total) by mouth 2 (two) times daily with a meal. Qty: 60 tablet, Refills: 0    ciprofloxacin (CIPRO) 500 MG tablet Take 1 tablet (500 mg total) by mouth 2 (two) times daily. Qty: 20 tablet, Refills: 0    colchicine 0.6 MG tablet Take 1 tablet (0.6 mg total) by mouth 2 (two) times daily. Qty: 60 tablet, Refills: 0    predniSONE (DELTASONE) 20 MG tablet Take 1 tablet (20 mg total) by mouth daily with breakfast. Qty: 3 tablet, Refills: 0      CONTINUE these medications which have CHANGED   Details  traMADol (ULTRAM) 50 MG tablet Take 1 tablet (50 mg total) by mouth every 6 (six) hours as needed. Qty: 30 tablet, Refills: 0      CONTINUE these medications which have NOT CHANGED   Details  acetaminophen (TYLENOL) 325 MG tablet Take 650 mg by mouth every 6 (six) hours as needed for mild pain.    aspirin 325 MG tablet Take 325 mg by mouth daily.    Calcium Carbonate-Vitamin D (CALCIUM + D) 600-200 MG-UNIT TABS Take 1 tablet by mouth 2 (two) times daily.     Cyanocobalamin 1000 MCG SUBL Place 1 tablet (1,000 mcg total) under the tongue daily. Qty: 90 tablet, Refills: 3    furosemide (LASIX) 40 MG tablet Take 40 mg by mouth daily.    levothyroxine (SYNTHROID, LEVOTHROID) 25 MCG tablet TAKE 1 TABLET DAILY FOR THYROID Qty: 30 tablet, Refills: 6    Multiple Vitamin (MULTIVITAMIN) tablet Take 1 tablet by mouth daily.      multivitamin-lutein (OCUVITE-LUTEIN) CAPS capsule Take 1 capsule by mouth daily.    oxybutynin (DITROPAN) 5 MG tablet Take 1 tablet (5 mg total) by mouth 3 (three) times daily. Qty: 90 tablet, Refills: 2    pantoprazole (PROTONIX) 40 MG tablet Take 1 tablet (40 mg total) by mouth daily. Qty: 30 tablet, Refills: 3   Associated Diagnoses: Chest pain, unspecified chest pain type    potassium chloride (MICRO-K) 10 MEQ CR capsule Take 10 mEq by mouth daily.    diclofenac (CATAFLAM) 50 MG tablet Take 1 tablet (50 mg total) by mouth 3 (three) times  daily. Qty: 60 tablet, Refills: 1    losartan (COZAAR) 100 MG tablet TAKE ONE (1) TABLET BY MOUTH EVERY DAY Qty: 30 tablet, Refills: 5    mupirocin ointment (BACTROBAN) 2 % Place 1 application into the nose 2 (two) times daily. Qty: 22 g, Refills: 0   Associated Diagnoses: Nasal abscess    nystatin (MYCOSTATIN) powder Apply topically 2 (two) times daily. Qty: 15 g, Refills: 3      STOP taking these medications     midodrine (PROAMATINE) 5 MG tablet      simvastatin (ZOCOR) 40 MG tablet          DISCHARGE INSTRUCTIONS:   Follow-up with primary care physician in a week Follow-up with cardiology Dr. Rockey Situ in a week, needs CABG staples removed   DIET:  Healthy heart  DISCHARGE CONDITION:  Satisfactory  ACTIVITY:  As tolerated, as recommended by physical therapy  OXYGEN:  Home Oxygen: no   DISCHARGE LOCATION:  Skilled nursing care facility  If you experience worsening of your admission symptoms, develop shortness of breath, life threatening emergency, suicidal or homicidal thoughts you must seek medical attention immediately by calling 911 or calling your MD immediately  if symptoms less severe.  You Must read complete instructions/literature along with all the possible adverse reactions/side effects for all the Medicines you take and that have been prescribed to you. Take any new Medicines after you have completely understood and accpet all the possible adverse reactions/side effects.   Please note  You were cared for by a hospitalist during your hospital stay. If you have any questions about your discharge medications or the care you received while you were in the hospital after you are discharged, you can call the unit and asked to speak with the hospitalist on call if the hospitalist that took care of you is not available. Once you are discharged, your primary care physician will handle any further medical issues. Please note that NO REFILLS for any discharge  medications will be authorized once you are discharged, as it is imperative that you return to your primary care physician (or establish a relationship with a primary care physician if you do not have one) for your aftercare needs so that they can reassess your need for medications and monitor your lab values.     Today  Chief Complaint  Patient presents with  . Weakness    patient is out of bed to chair. Resting comfortably. Denies any complaints. Denies any chest pain. Great toe pain is significantly improved  ROS:  CONSTITUTIONAL: Denies fevers, chills. Denies any fatigue, weakness.  EYES: Denies blurry vision, double vision, eye pain. EARS, NOSE, THROAT: Denies tinnitus, ear pain, hearing loss. RESPIRATORY: Denies cough, wheeze, shortness of breath.  CARDIOVASCULAR: Denies chest pain, palpitations, edema.  GASTROINTESTINAL: Denies nausea, vomiting, diarrhea, abdominal pain. Denies bright red blood per rectum. GENITOURINARY: Denies dysuria, hematuria. ENDOCRINE: Denies nocturia or thyroid problems. HEMATOLOGIC AND LYMPHATIC: Denies easy bruising or bleeding. SKIN: Denies rash or lesion. MUSCULOSKELETAL: Denies pain in neck, back, shoulder, knees, hips or arthritic symptoms.  NEUROLOGIC: Denies paralysis, paresthesias.  PSYCHIATRIC: Denies anxiety or depressive symptoms.   VITAL SIGNS:  Blood pressure 120/57, pulse 65, temperature 98.2 F (36.8 C), temperature source Oral, resp. rate 18, height 5\' 4"  (1.626 m), weight 85.639 kg (188 lb 12.8 oz), SpO2 95 %.  I/O:   Intake/Output Summary (Last 24 hours) at 10/26/14 1506 Last data filed at 10/26/14 1500  Gross per 24 hour  Intake    480 ml  Output   2150 ml  Net  -1670 ml    PHYSICAL EXAMINATION:  GENERAL:  81 y.o.-year-old patient lying in the bed with no acute distress.  EYES: Pupils equal, round, reactive to light and accommodation. No scleral icterus. Extraocular muscles intact.  HEENT: Head atraumatic, normocephalic.  Oropharynx and nasopharynx clear.  NECK:  Supple, no jugular venous distention. No thyroid enlargement, no tenderness.  LUNGS: Normal breath sounds bilaterally, no wheezing, rales,rhonchi or crepitation. No use of accessory muscles of respiration.  CARDIOVASCULAR: S1, S2 normal. No murmurs, rubs, or gallops.  ABDOMEN: Soft, non-tender, non-distended. Bowel sounds present. No organomegaly or mass.  EXTREMITIES: No pedal edema, cyanosis, or clubbing.  NEUROLOGIC: Cranial nerves II through XII are intact. Muscle strength 5/5 in all extremities. Sensation intact. Gait not checked.  PSYCHIATRIC: The patient is alert and oriented x 3.  SKIN: No obvious rash, lesion, or ulcer.   DATA REVIEW:   CBC  Recent Labs Lab 10/26/14 0501  WBC 13.8*  HGB 9.9*  HCT 29.5*  PLT 344    Chemistries   Recent Labs Lab 10/25/14 0513  NA 135  K 4.3  CL 96*  CO2 29  GLUCOSE 144*  BUN 31*  CREATININE 1.05*  CALCIUM 8.6*    Cardiac Enzymes  Recent Labs Lab 10/22/14 1032  TROPONINI 0.06*    Microbiology Results  Results for orders placed or performed during the hospital encounter of 10/22/14  Urine culture     Status: None   Collection Time: 10/22/14 12:33 PM  Result Value Ref Range Status   Specimen Description URINE, RANDOM  Final   Special Requests NONE  Final   Culture   Final    >=100,000 COLONIES/mL PSEUDOMONAS AERUGINOSA 50,000 COLONIES/mL ESCHERICHIA COLI    Report Status 10/26/2014 FINAL  Final   Organism ID, Bacteria PSEUDOMONAS AERUGINOSA  Final   Organism ID, Bacteria ESCHERICHIA COLI  Final      Susceptibility   Escherichia coli - MIC*    AMPICILLIN <=2 SENSITIVE Sensitive     CEFTAZIDIME <=1 SENSITIVE Sensitive     CEFAZOLIN <=4 SENSITIVE Sensitive     CEFTRIAXONE <=1 SENSITIVE Sensitive     CIPROFLOXACIN <=0.25 SENSITIVE Sensitive     GENTAMICIN <=1 SENSITIVE Sensitive     IMIPENEM <=0.25 SENSITIVE Sensitive     TRIMETH/SULFA >=320 RESISTANT Resistant      CEFOXITIN <=4 SENSITIVE Sensitive     * 50,000 COLONIES/mL ESCHERICHIA COLI   Pseudomonas aeruginosa - MIC*    CEFTAZIDIME 4 SENSITIVE Sensitive     CIPROFLOXACIN <=0.25 SENSITIVE Sensitive     GENTAMICIN <=1 SENSITIVE Sensitive     IMIPENEM 2 SENSITIVE Sensitive     * >=100,000 COLONIES/mL PSEUDOMONAS AERUGINOSA  Culture, blood (routine x 2)     Status: None (Preliminary result)   Collection Time: 10/22/14  1:43 PM  Result Value Ref Range Status   Specimen Description BLOOD  Final   Special Requests NONE  Final   Culture NO GROWTH 4 DAYS  Final   Report Status PENDING  Incomplete  Culture, blood (routine x 2)     Status: None (Preliminary result)   Collection Time: 10/22/14  1:43 PM  Result Value Ref Range Status   Specimen Description BLOOD  Final   Special Requests NONE  Final   Culture NO GROWTH 4 DAYS  Final   Report Status PENDING  Incomplete  Urine culture     Status: None   Collection Time: 10/22/14  7:00 PM  Result Value Ref Range Status   Specimen Description URINE, CLEAN CATCH  Final   Special Requests NONE  Final   Culture DUPLICATE REQUEST TEST CREDITED   Final   Report Status 10/23/2014 FINAL  Final    RADIOLOGY:  Dg Hand 2 View Right  10/23/2014   CLINICAL DATA:  Diffuse right hand pain with increased stiffness for the last few days. No reported injury.  EXAM: RIGHT HAND - 2 VIEW  COMPARISON:  None.  FINDINGS: Degenerative spur formation on both sides of the first IP joint, third PIP joint and second and third DIP joints. There is also joint space narrowing and sclerosis at the articulations of the trapezium and trapezoid with the scaphoid.  IMPRESSION: Degenerative changes, as described above.  No acute abnormality.   Electronically Signed   By: Claudie Revering M.D.   On: 10/23/2014 12:24   Dg Foot 2 Views Right  10/23/2014   CLINICAL  DATA:  Pain and erythema involving the base of the great toe, acute onset.  EXAM: RIGHT FOOT - 2 VIEW  COMPARISON:  None.  FINDINGS:  No evidence of acute or subacute fracture or dislocation. Well preserved joint spaces. Soft tissue calcification adjacent to the head of the 5th metatarsal. No associated erosions. Osseous demineralization. Small plantar calcaneal spur.  IMPRESSION: 1. No acute osseous abnormality. 2. Soft tissue calcification adjacent to the head of the 5th metatarsal without associated erosions. These calcifications may be capsular or bursal in origin and represent chronic inflammation.   Electronically Signed   By: Evangeline Dakin M.D.   On: 10/23/2014 12:14    EKG:   Orders placed or performed during the hospital encounter of 10/22/14  . ED EKG (<14mins upon arrival to the ED)  . ED EKG (<40mins upon arrival to the ED)  . EKG 12-Lead  . EKG 12-Lead      Management plans discussed with the patient, family and they are in agreement.  CODE STATUS:     Code Status Orders        Start     Ordered   10/22/14 1429  Full code   Continuous     10/22/14 1431    Advance Directive Documentation        Most Recent Value   Type of Advance Directive  Healthcare Power of Attorney   Pre-existing out of facility DNR order (yellow form or pink MOST form)     "MOST" Form in Place?        TOTAL TIME TAKING CARE OF THIS PATIENT: 45 minutes.    @[facility] @  on 10/26/2014 at 3:06 PM  Between 7am to 6pm - Pager - [phone]  After 6pm go to [email or web address] - password [email or web address] [facility]  Office  [phone]  CC: primary care physician , cardiology Dr. Rockey Situ Primary care physician; [name], MD

## 2014-10-26 NOTE — Telephone Encounter (Signed)
Noted notes in chart.

## 2014-10-26 NOTE — Progress Notes (Signed)
Physical Therapy Treatment Patient Details Name: Jenna Marshall MRN: [id number] DOB: July 27, 1925 Today's Date: 10/26/2014    History of Present Illness Pt is an 81yo white female c PMH HTN, depression, and macular degeneration, who was recently DC from [facility] s/p CABG (on POD 9 per daughter). Pt was at [facility] for one daty when she began to feel weak and malaised. Pt arrived at [facility] found to have UTI. Family has been most converned about weakness and poor appetit since surgery.     PT Comments    Pt making good progress with ambulation distance. Improving technique and adherence to sternal precautions with bed mobility and STS transfers. Pt currently has discharge orders to SNF.   Follow Up Recommendations  SNF     Equipment Recommendations       Recommendations for Other Services       Precautions / Restrictions Precautions Precautions: Fall;Sternal Restrictions Weight Bearing Restrictions: No    Mobility  Bed Mobility Overal bed mobility: Needs Assistance Bed Mobility: Supine to Sit     Supine to sit: Min assist        Transfers Overall transfer level: Needs assistance Equipment used: Rolling walker (2 wheeled) Transfers: Sit to/from Stand Sit to Stand: Min assist         General transfer comment: Improved hand placement today (Min A this a.m. both from bed and commode)  Ambulation/Gait Ambulation/Gait assistance: Min guard Ambulation Distance (Feet): 100 Feet (another walk for 25 ft) Assistive device: Rolling walker (2 wheeled) Gait Pattern/deviations: Step-through pattern Gait velocity: Reduced Gait velocity interpretation: <1.8 ft/sec, indicative of risk for recurrent falls     Stairs            Wheelchair Mobility    Modified Rankin (Stroke Patients Only)       Balance                                    Cognition Arousal/Alertness: Awake/alert Behavior During Therapy: WFL for tasks  assessed/performed Overall Cognitive Status: Within Functional Limits for tasks assessed                      Exercises General Exercises - Lower Extremity Ankle Circles/Pumps: AROM;Both;15 reps;Supine Quad Sets: Strengthening;Both;20 reps;Supine Gluteal Sets: Strengthening;Both;20 reps;Supine Long Arc Quad: Both;20 reps;Seated;AROM Hip ABduction/ADduction: Strengthening;Both;15 reps;Supine Straight Leg Raises: Strengthening;Both;15 reps;Supine Hip Flexion/Marching: AROM;Both;20 reps;Seated Toe Raises: AROM;Both;20 reps;Seated Heel Raises: AROM;20 reps;Both;Seated    General Comments        Pertinent Vitals/Pain Pain Assessment: No/denies pain    Home Living                      Prior Function            PT Goals (current goals can now be found in the care plan section) Progress towards PT goals: Progressing toward goals    Frequency  Min 2X/week    PT Plan Current plan remains appropriate    Co-evaluation             End of Session Equipment Utilized During Treatment: Gait belt Activity Tolerance: Patient tolerated treatment well (Improving all areas) Patient left: in bed;with call bell/phone within reach;with bed alarm set;with family/visitor present     Time: 4098-1191 PT Time Calculation (min) (ACUTE ONLY): 38 min  Charges:  $Gait Training: 8-22 mins $Therapeutic Exercise: 8-22 mins $Therapeutic Activity:  8-22 mins                    G Codes:      Charlaine Dalton 10/26/2014, 11:06 AM

## 2014-10-26 NOTE — Telephone Encounter (Signed)
Jenna Marshall, or [facility]  please call patient for hospital follow up call . thanks

## 2014-10-26 NOTE — Telephone Encounter (Signed)
The patient has a hospital follow up scheduled for the 31st (she is being d/c today)

## 2014-10-26 NOTE — Telephone Encounter (Signed)
Pt is being discharged to SNF, no TCM call needed until discharged from SNF.

## 2014-10-27 ENCOUNTER — Ambulatory Visit: Payer: Commercial Managed Care - HMO | Admitting: Internal Medicine

## 2014-10-27 DIAGNOSIS — M109 Gout, unspecified: Secondary | ICD-10-CM | POA: Diagnosis not present

## 2014-10-27 DIAGNOSIS — I2510 Atherosclerotic heart disease of native coronary artery without angina pectoris: Secondary | ICD-10-CM | POA: Diagnosis not present

## 2014-10-27 DIAGNOSIS — N390 Urinary tract infection, site not specified: Secondary | ICD-10-CM | POA: Diagnosis not present

## 2014-10-27 LAB — CULTURE, BLOOD (ROUTINE X 2)
Culture: NO GROWTH
Culture: NO GROWTH

## 2014-10-28 ENCOUNTER — Telehealth: Payer: Self-pay

## 2014-10-28 NOTE — Telephone Encounter (Signed)
Attempted to contact pt regarding discharge from [facility] on 10/27/14. Left message for pt to call back w/ any questions or concerns regarding discharge instructions or medications.  Advised her of appt w/ Dr. Fletcher Anon on 11/14/14 @ 2:30. Asked her to call back if she is unable to keep this appt.

## 2014-10-28 NOTE — Telephone Encounter (Signed)
-----   Message from Arie Sabina sent at 10/26/2014  1:52 PM EDT ----- Regarding: tcm/ph Dr. Fletcher Anon 11/14/14 @ 2:30

## 2014-11-01 ENCOUNTER — Ambulatory Visit: Payer: Commercial Managed Care - HMO | Admitting: Internal Medicine

## 2014-11-01 ENCOUNTER — Other Ambulatory Visit: Payer: Self-pay | Admitting: *Deleted

## 2014-11-01 ENCOUNTER — Telehealth: Payer: Self-pay

## 2014-11-01 NOTE — Telephone Encounter (Signed)
Patient is in skilled Nursing facility. At [facility]. FYI

## 2014-11-01 NOTE — Patient Outreach (Signed)
[facility]) Care Management  [facility] Social Work  11/01/2014  Jenna Marshall 1925/09/21 [id number]     Current Medications:  Current Outpatient Prescriptions  Medication Sig Dispense Refill  . acetaminophen (TYLENOL) 325 MG tablet Take 650 mg by mouth every 6 (six) hours as needed for mild pain.    Marland Kitchen aspirin 325 MG tablet Take 325 mg by mouth daily.    Marland Kitchen atorvastatin (LIPITOR) 80 MG tablet Take 1 tablet (80 mg total) by mouth daily at 6 PM. 30 tablet 0  . Calcium Carbonate-Vitamin D (CALCIUM + D) 600-200 MG-UNIT TABS Take 1 tablet by mouth 2 (two) times daily.     . carvedilol (COREG) 3.125 MG tablet Take 1 tablet (3.125 mg total) by mouth 2 (two) times daily with a meal. 60 tablet 0  . ciprofloxacin (CIPRO) 500 MG tablet Take 1 tablet (500 mg total) by mouth 2 (two) times daily. 20 tablet 0  . colchicine 0.6 MG tablet Take 1 tablet (0.6 mg total) by mouth 2 (two) times daily. 60 tablet 0  . Cyanocobalamin 1000 MCG SUBL Place 1 tablet (1,000 mcg total) under the tongue daily. 90 tablet 3  . diclofenac (CATAFLAM) 50 MG tablet Take 1 tablet (50 mg total) by mouth 3 (three) times daily. 60 tablet 1  . furosemide (LASIX) 40 MG tablet Take 40 mg by mouth daily.    Marland Kitchen levothyroxine (SYNTHROID, LEVOTHROID) 25 MCG tablet TAKE 1 TABLET DAILY FOR THYROID 30 tablet 6  . losartan (COZAAR) 100 MG tablet TAKE ONE (1) TABLET BY MOUTH EVERY DAY 30 tablet 5  . Multiple Vitamin (MULTIVITAMIN) tablet Take 1 tablet by mouth daily.      . multivitamin-lutein (OCUVITE-LUTEIN) CAPS capsule Take 1 capsule by mouth daily.    . mupirocin ointment (BACTROBAN) 2 % Place 1 application into the nose 2 (two) times daily. 22 g 0  . nystatin (MYCOSTATIN) powder Apply topically 2 (two) times daily. 15 g 3  . oxybutynin (DITROPAN) 5 MG tablet Take 1 tablet (5 mg total) by mouth 3 (three) times daily. 90 tablet 2  . pantoprazole (PROTONIX) 40 MG tablet Take 1 tablet (40 mg total) by mouth daily. 30 tablet  3  . potassium chloride (MICRO-K) 10 MEQ CR capsule Take 10 mEq by mouth daily.    . predniSONE (DELTASONE) 20 MG tablet Take 1 tablet (20 mg total) by mouth daily with breakfast. 3 tablet 0  . traMADol (ULTRAM) 50 MG tablet Take 1 tablet (50 mg total) by mouth every 6 (six) hours as needed. 30 tablet 0  . [DISCONTINUED] citalopram (CELEXA) 20 MG tablet Take 20 mg by mouth daily.       No current facility-administered medications for this visit.    Functional Status:  In your present state of health, do you have any difficulty performing the following activities: 11/01/2014 10/22/2014  Hearing? Y N  Vision? N N  Difficulty concentrating or making decisions? N N  Walking or climbing stairs? Y Y  Dressing or bathing? Y N  Doing errands, shopping? Y N  Preparing Food and eating ? N -  Using the Toilet? Y -  In the past six months, have you accidently leaked urine? Y -  Do you have problems with loss of bowel control? N -  Managing your Medications? Y -  Managing your Finances? Y -  Housekeeping or managing your Housekeeping? Y -    Fall/Depression Screening:  [facility] 2/9 Scores 11/01/2014 09/29/2014  PHQ -  2 Score 0 0    Assessment:This Education officer, museum visited patient at [employer].  Patient's daughter(Health care power of attorney) in room visiting as well.  Per patient she id looking at 11/1014 for discharge home.  Patient currnently lives alone, however has 3 children that live locally who will be her main caregiver's post discharge for rehabilitation, all taking turns providing care.  Per patient she is  progressing well in physical and occupational therapy and is participating fully in treatment.  Per patient's daughter, patient has no stairs to navigate in her home and lives in a one level home.  Patient has a step in shower and  grab bars already installed in the bathroom.  Per patient's daughter, patient does have some throw rugs that will be removed to help avoid  falls.  Discharge planning meeting scheduled for 11/08/14 at 2:00pm.  [facility] program discussed, verbal consent given, however written consent form and [facility] program materials left for patient and daughter to review. Plan:  This Education officer, museum will follow up with patient in 1 week.  This Education officer, museum will follow up with discharge planner at next visit.  Due to scheduling conflict, social worker unable to attend discharge planning meeting.     [facility] CM Care Plan Problem One        Patient Outreach from 11/01/2014 in [place] Problem One  patient currently in [place] for Problem One  Active   [facility] CM Short Term Goal #1 (0-30 days)  Patient will have all discharge planning needs met with the next 30 days in preparation for discharge home   [facility] CM Short Term Goal #1 Start Date  11/01/14   Interventions for Short Term Goal #1  Per patient's daughter, discharge plannin gmeeting scheduled for 11/08/14 CSW discussed with patient  plan to attend or will follow up  with discharge planner by phone  to discuss details of discharge   [facility] CM Short Term Goal #2 (0-30 days)  patient to actively participate in physical and occupational therapy within the next 30 days in preparation for discharge home   [facility] CM Short Term Goal #2 Start Date  11/01/14   Interventions for Short Term Goal #2  patient encouraged continued participation in daily physical and occupational therapy       [name] [facility] Care Management [phone]

## 2014-11-01 NOTE — Telephone Encounter (Signed)
Called pt to re-schedule no-showed hosp follow up apt.  No answer, lvmom.

## 2014-11-02 ENCOUNTER — Encounter
Admission: RE | Admit: 2014-11-02 | Discharge: 2014-11-02 | Disposition: A | Discharge status: Home / Self Care | Payer: Commercial Managed Care - HMO | Source: Ambulatory Visit | Attending: Internal Medicine | Admitting: Internal Medicine

## 2014-11-02 ENCOUNTER — Encounter: Payer: Commercial Managed Care - HMO | Admitting: Cardiovascular Disease

## 2014-11-08 ENCOUNTER — Other Ambulatory Visit: Payer: Self-pay | Admitting: *Deleted

## 2014-11-09 NOTE — Patient Outreach (Signed)
[facility]) Care Management  [facility] Social Work  11/09/2014  Maryn Freelove [birth year] [id number]    Current Medications:  Current Outpatient Prescriptions  Medication Sig Dispense Refill  . acetaminophen (TYLENOL) 325 MG tablet Take 650 mg by mouth every 6 (six) hours as needed for mild pain.    Marland Kitchen aspirin 325 MG tablet Take 325 mg by mouth daily.    Marland Kitchen atorvastatin (LIPITOR) 80 MG tablet Take 1 tablet (80 mg total) by mouth daily at 6 PM. 30 tablet 0  . Calcium Carbonate-Vitamin D (CALCIUM + D) 600-200 MG-UNIT TABS Take 1 tablet by mouth 2 (two) times daily.     . carvedilol (COREG) 3.125 MG tablet Take 1 tablet (3.125 mg total) by mouth 2 (two) times daily with a meal. 60 tablet 0  . colchicine 0.6 MG tablet Take 1 tablet (0.6 mg total) by mouth 2 (two) times daily. 60 tablet 0  . Cyanocobalamin 1000 MCG SUBL Place 1 tablet (1,000 mcg total) under the tongue daily. 90 tablet 3  . diclofenac (CATAFLAM) 50 MG tablet Take 1 tablet (50 mg total) by mouth 3 (three) times daily. 60 tablet 1  . furosemide (LASIX) 40 MG tablet Take 40 mg by mouth daily.    Marland Kitchen levothyroxine (SYNTHROID, LEVOTHROID) 25 MCG tablet TAKE 1 TABLET DAILY FOR THYROID 30 tablet 6  . losartan (COZAAR) 100 MG tablet TAKE ONE (1) TABLET BY MOUTH EVERY DAY 30 tablet 5  . Multiple Vitamin (MULTIVITAMIN) tablet Take 1 tablet by mouth daily.      . multivitamin-lutein (OCUVITE-LUTEIN) CAPS capsule Take 1 capsule by mouth daily.    . mupirocin ointment (BACTROBAN) 2 % Place 1 application into the nose 2 (two) times daily. 22 g 0  . nystatin (MYCOSTATIN) powder Apply topically 2 (two) times daily. 15 g 3  . oxybutynin (DITROPAN) 5 MG tablet Take 1 tablet (5 mg total) by mouth 3 (three) times daily. 90 tablet 2  . pantoprazole (PROTONIX) 40 MG tablet Take 1 tablet (40 mg total) by mouth daily. 30 tablet 3  . potassium chloride (MICRO-K) 10 MEQ CR capsule Take 10 mEq by mouth daily.    . predniSONE (DELTASONE) 20  MG tablet Take 1 tablet (20 mg total) by mouth daily with breakfast. (Patient not taking: Reported on 11/01/2014) 3 tablet 0  . traMADol (ULTRAM) 50 MG tablet Take 1 tablet (50 mg total) by mouth every 6 (six) hours as needed. (Patient not taking: Reported on 11/01/2014) 30 tablet 0  . [DISCONTINUED] citalopram (CELEXA) 20 MG tablet Take 20 mg by mouth daily.       No current facility-administered medications for this visit.    Functional Status:  In your present state of health, do you have any difficulty performing the following activities: 11/01/2014 10/22/2014  Hearing? Y N  Vision? N N  Difficulty concentrating or making decisions? N N  Walking or climbing stairs? Y Y  Dressing or bathing? Y N  Doing errands, shopping? Y N  Preparing Food and eating ? N -  Using the Toilet? Y -  In the past six months, have you accidently leaked urine? Y -  Do you have problems with loss of bowel control? N -  Managing your Medications? Y -  Managing your Finances? Y -  Housekeeping or managing your Housekeeping? Y -    Fall/Depression Screening:  PHQ 2/9 Scores 11/01/2014 09/29/2014  PHQ - 2 Score 0 0    Assessment: CSW visited patient  at [facility].  Patient's daughter visiting when this CSW arrived.  Patient continues to participate in physical and occupational therapy during her stay.  Estimated discharge will be 11/11/14.  Patient to discharge to her own home, however her children will be providing 24 hour supervision for the first several weeks post discharge.  Patient has care planning meeting today at 2:30pm. CSW could not attend due to scheduling conflict.  Patient's 2 daughters plan to attend to gain specifics regarding patient discharge plan.  CSW spoke with patient's RN who stated  no foreseeable  issues preventing patient from discharging on 11/11/14. Patient's daughter requested community resources for respite and custodial care just in case need arises in the  future.  Community resources provided.   Plan: pa CSW will follow up with patient following discharge from the skilled nursing facility to assess for continued social work needs.  RNCM to be notified of expected discharge date.     [name] [facility] Care Management [phone]

## 2014-11-14 ENCOUNTER — Ambulatory Visit (INDEPENDENT_AMBULATORY_CARE_PROVIDER_SITE_OTHER): Payer: Commercial Managed Care - HMO | Admitting: Cardiovascular Disease

## 2014-11-14 ENCOUNTER — Encounter: Payer: Self-pay | Admitting: Cardiovascular Disease

## 2014-11-14 VITALS — BP 84/52 | HR 87 | Ht 62.0 in | Wt 179.0 lb

## 2014-11-14 DIAGNOSIS — E785 Hyperlipidemia, unspecified: Secondary | ICD-10-CM | POA: Diagnosis not present

## 2014-11-14 DIAGNOSIS — I10 Essential (primary) hypertension: Secondary | ICD-10-CM | POA: Diagnosis not present

## 2014-11-14 DIAGNOSIS — Z951 Presence of aortocoronary bypass graft: Secondary | ICD-10-CM | POA: Diagnosis not present

## 2014-11-14 DIAGNOSIS — I2510 Atherosclerotic heart disease of native coronary artery without angina pectoris: Secondary | ICD-10-CM

## 2014-11-14 MED ORDER — FUROSEMIDE 20 MG PO TABS: 20.0000 mg | ORAL_TABLET | Freq: Every day | ORAL | Status: DC

## 2014-11-14 NOTE — Assessment & Plan Note (Signed)
Lab Results  Component Value Date   CHOL 153 09/29/2014   HDL 50.50 09/29/2014   LDLCALC 80 09/29/2014   LDLDIRECT 104.4 02/27/2011   TRIG 113.0 09/29/2014   CHOLHDL 3 09/29/2014   Continue treatment with high dose atorvastatin. I will plan on repeat fasting lipid and liver profile in one month.

## 2014-11-14 NOTE — Progress Notes (Signed)
HPI  81 year old female with history of CAD s/p recent 4 vessel CABG on 6/65/9935, diastolic dysfunction, hypertension, and hyperlipidemia who is here today for a follow-up visit.  She was admitted to [facility] on 5/10 with a several week history of intermittent sub sternal chest pain and aching in the forearm muscles bilaterally that was occuring with exertion and at rest. She ruled out for MI. She had a high risk nuclear stress test (EF 41%) on 10/13/2014 and catheterization on 10/14/2014 showing 99% distal LM stenosis and significant LAD and RCA stenosis. An echo showed an LVEF of 70-17% and diastolic dysfunction.  She was transferred to [facility] on 5/13 for urgent CABG . She underwent a CABG x 4 on 10/15/2014. Successful LIMA-->LAD, SVG-->RCA, and sequential SVG to Ramus and OM. She remained afebrile and hemodynamically stable.  She was placed on Midodrin for hypotension. She presented to [facility] on 5/21 with increased weakness and inability to get out of bed to ambulate. She was also noted to have a UTI. CXR showed moderate left pleural effusion and small to moderate sized right pleural effusion with bibasilar effusion with associated atelectasis. PNA cannot be excluded. Tropnon 0.06. She improved with diuresis. Midodrin was discontinued. She was discharged to a skilled nursing facility and today was actually her last day. She reports significant improvement in strength and stamina overall with no chest pain or shortness of breath. Blood pressure is running low today and she denies dizziness.  Allergies  Allergen Reactions  . Codeine Nausea Only  . Codeine Sulfate Hives     Current Outpatient Prescriptions on File Prior to Visit  Medication Sig Dispense Refill  . acetaminophen (TYLENOL) 325 MG tablet Take 650 mg by mouth every 6 (six) hours as needed for mild pain.    Marland Kitchen aspirin 325 MG tablet Take 325 mg by mouth daily.    Marland Kitchen atorvastatin (LIPITOR) 80 MG tablet Take 1 tablet (80 mg total) by  mouth daily at 6 PM. 30 tablet 0  . Calcium Carbonate-Vitamin D (CALCIUM + D) 600-200 MG-UNIT TABS Take 1 tablet by mouth 2 (two) times daily.     . carvedilol (COREG) 3.125 MG tablet Take 1 tablet (3.125 mg total) by mouth 2 (two) times daily with a meal. 60 tablet 0  . colchicine 0.6 MG tablet Take 1 tablet (0.6 mg total) by mouth 2 (two) times daily. 60 tablet 0  . Cyanocobalamin 1000 MCG SUBL Place 1 tablet (1,000 mcg total) under the tongue daily. 90 tablet 3  . furosemide (LASIX) 40 MG tablet Take 40 mg by mouth daily.    Marland Kitchen levothyroxine (SYNTHROID, LEVOTHROID) 25 MCG tablet TAKE 1 TABLET DAILY FOR THYROID 30 tablet 6  . Multiple Vitamin (MULTIVITAMIN) tablet Take 1 tablet by mouth daily.      . multivitamin-lutein (OCUVITE-LUTEIN) CAPS capsule Take 1 capsule by mouth daily.    . mupirocin ointment (BACTROBAN) 2 % Place 1 application into the nose 2 (two) times daily. 22 g 0  . nystatin (MYCOSTATIN) powder Apply topically 2 (two) times daily. 15 g 3  . pantoprazole (PROTONIX) 40 MG tablet Take 1 tablet (40 mg total) by mouth daily. 30 tablet 3  . potassium chloride (MICRO-K) 10 MEQ CR capsule Take 10 mEq by mouth daily.    . [DISCONTINUED] citalopram (CELEXA) 20 MG tablet Take 20 mg by mouth daily.       No current facility-administered medications on file prior to visit.     Past Medical  History  Diagnosis Date  . Macular degeneration   . Hyperlipidemia   . Hypertension   . Hypothyroidism   . Depression   . pernicious anemia   . Varicose veins   . CAD (coronary artery disease)     a. Lexi 10/13/14: mid ant to apical & inf wall ischemia w/ WMA, mild-mod dep EF; b. cath 10/14/14: ost LM to LM 50%, dLM 99%, ost LAD 95%, ost LAD 95%, pLAD-LAD 40%, pRCA 30%, mRCA 70%, Critical LM stenosis. Sig dRCA stenosis. Heavily calcified arteries; c. CABG x 4 (10/2014)  . Diastolic dysfunction     a. echo 10/2014: EF 55-60%, no RWMA, GR1DD, mild AI, trivial MR, mildly dilated LA, PASP normal      Past Surgical History  Procedure Laterality Date  . Coronary artery bypass graft  10/2014    LIMA-->LAD, SVG-->RCA, sequential SVG-->Ramus and OM     Family History  Problem Relation Age of Onset  . Diabetes Mother   . Heart disease Father      History   Social History  . Marital Status: Widowed    Spouse Name: N/A  . Number of Children: N/A  . Years of Education: N/A   Occupational History  . Not on file.   Social History Main Topics  . Smoking status: Never Smoker   . Smokeless tobacco: Never Used  . Alcohol Use: No  . Drug Use: No  . Sexual Activity: No   Other Topics Concern  . Not on file   Social History Narrative     PHYSICAL EXAM   BP 84/52 mmHg  Pulse 87  Ht 5\' 2"  (1.575 m)  Wt 179 lb (81.194 kg)  BMI 32.73 kg/m2 Constitutional: She is oriented to person, place, and time. She appears well-developed and well-nourished. No distress.  HENT: No nasal discharge.  Head: Normocephalic and atraumatic.  Eyes: Pupils are equal and round. No discharge.  Neck: Normal range of motion. Neck supple. No JVD present. No thyromegaly present.  Cardiovascular: Normal rate, regular rhythm, normal heart sounds. Exam reveals no gallop and no friction rub. No murmur heard. Surgical incision site is healing nicely.  Pulmonary/Chest: Effort normal and breath sounds normal. No stridor. No respiratory distress. She has no wheezes. She has no rales. She exhibits no tenderness.  Abdominal: Soft. Bowel sounds are normal. She exhibits no distension. There is no tenderness. There is no rebound and no guarding.  Musculoskeletal: Normal range of motion. She exhibits no edema and no tenderness.  Neurological: She is alert and oriented to person, place, and time. Coordination normal.  Skin: Skin is warm and dry. No rash noted. She is not diaphoretic. No erythema. No pallor.  Psychiatric: She has a normal mood and affect. Her behavior is normal. Judgment and thought content  normal.     EKG: m Sinus  Rhythm  Low voltage in precordial leads.   -  Diffuse nonspecific T-abnormality.   ABNORMAL    ASSESSMENT AND PLAN

## 2014-11-14 NOTE — Patient Instructions (Signed)
Medication Instructions:  Your physician has recommended you make the following change in your medication:  1) STOP taking losartan 2) DECREASE lasix to 20mg  once a day   Labwork: Your physician recommends that you return for a FASTING lipid and liver profile, BMET in one month. Nothing to eat or drink after midnight the evening before your test.   Testing/Procedures: none  Follow-Up: Your physician recommends that you schedule a follow-up appointment in: one month with Dr. Fletcher Anon.    Any Other Special Instructions Will Be Listed Below (If Applicable).

## 2014-11-14 NOTE — Assessment & Plan Note (Signed)
Blood pressure is running low but she seems to be asymptomatic. I elected to discontinue losartan.

## 2014-11-14 NOTE — Assessment & Plan Note (Signed)
She is doing well and probably better than expected. She did have hospitalization for UTI and fluid overload. Currently she has no symptoms of angina or heart failure. I elected to decrease the dose of Lasix to 20 mg once daily. Continue aspirin. I will plan on checking basic metabolic profile upon follow-up.

## 2014-11-17 ENCOUNTER — Other Ambulatory Visit: Payer: Self-pay | Admitting: *Deleted

## 2014-11-17 ENCOUNTER — Encounter: Payer: Self-pay | Admitting: Cardiovascular Disease

## 2014-11-17 ENCOUNTER — Telehealth: Payer: Self-pay | Admitting: Internal Medicine

## 2014-11-17 NOTE — Patient Outreach (Signed)
RNCM made a TOC call to pt. Pt answered and expressed she would really like for me to call her back in the am when her daughter would be there. Pt denies sob, dizziness or extremity swelling. She stated her kids have been staying with her around the clock since her d/c from the SNF on Monday. Pt stated she had received a call from the [facility] agency who would be coming out and giving her PT. Pt also expressed she had seen her cardiologist this week post d/c from the SNF.   Plan: RNCM will contact pt tom am when daughter is available.   Tahmid Stonehocker RN, BSN  [facility] Care Management [phone]) `

## 2014-11-18 ENCOUNTER — Encounter: Payer: Self-pay | Admitting: *Deleted

## 2014-11-18 ENCOUNTER — Other Ambulatory Visit: Payer: Self-pay | Admitting: *Deleted

## 2014-11-18 NOTE — Patient Outreach (Signed)
TOC call placed again this am per pt's request related to pt wanting RNCM to speak with her daughter who manages her medications and appointments. In talking with the daughter she was concerned the home health agency had not started seeing her mom yet. Daughter felt her mom's strength was declining because of the lack of PT since being discharged from the SNF. Daughter requested RNCM investigate the possibility of having a new home health agency come to see her mother. Daughter also concerned pt was having some swelling in her lower extremities. Med review done with daughter.   Plan: RNCM will make a short home visit with pt today related to daughter's concerns. RNCM will place care coordination calls to obtain a [facility] agency willing to see pt sooner.   Rutherford Limerick RN, BSN  [facility] Care Management [phone])

## 2014-11-18 NOTE — Patient Outreach (Signed)
[facility]) Care Management  11/18/2014  Jenna Marshall 11-12-1925 [id number]   Phone call to patient to schedule home visit to assess for further social work needs.  Home visit scheduled for 11/25/14 at 11:00am.   [name] [facility] Care Management [phone]

## 2014-11-19 ENCOUNTER — Other Ambulatory Visit: Payer: Self-pay | Admitting: *Deleted

## 2014-11-19 ENCOUNTER — Encounter: Payer: Self-pay | Admitting: *Deleted

## 2014-11-19 DIAGNOSIS — M6281 Muscle weakness (generalized): Secondary | ICD-10-CM | POA: Diagnosis not present

## 2014-11-19 DIAGNOSIS — I10 Essential (primary) hypertension: Secondary | ICD-10-CM | POA: Diagnosis not present

## 2014-11-19 DIAGNOSIS — I2510 Atherosclerotic heart disease of native coronary artery without angina pectoris: Secondary | ICD-10-CM | POA: Diagnosis not present

## 2014-11-19 DIAGNOSIS — Z48812 Encounter for surgical aftercare following surgery on the circulatory system: Secondary | ICD-10-CM | POA: Diagnosis not present

## 2014-11-19 DIAGNOSIS — Z951 Presence of aortocoronary bypass graft: Secondary | ICD-10-CM | POA: Diagnosis not present

## 2014-11-19 NOTE — Patient Outreach (Signed)
[facility]) Care Management   11/18/14 at 1650pm  Jenna Marshall 02/01/1926 [id number]  Jenna Marshall is an 81 y.o. female  Upon daughter's request during earlier [facility] call RNCM made an initial acute home visit to evaluate pt and assist daughter in medication organization. Daughter is the main care giver and stated she was feeling slightly overwhelmed since her mother's d/c from the SNF and just needed a second set of eyes. Daughter stated [facility] had been in contact with her and was planning to come tomorrow (11/19/14). Daughter was relieved her mother would be receiving PT.   Assessment: Jenna Marshall is a pleasant alert and oriented elderly pt staus post CABG and a readmission into the hospital from the SNF. She is now home from SNF and although her children are taking turns caring for her she hopes to again be independent. On physical exam Jenna Marshall other than some slight swelling to right lower extremity was not presenting with any signs of distress. Pt does report some fatigue and feeling more weak when trying to ambulate with her walker.   RNCM educated pt, daughter and daughter in law on s/s of worsening HF, gave them the HF packet and the [facility] calendar showing the HF zones. RNCM reviewed all medications with the daughter providing her with a pill organizer.RNCM explained again [facility] services and encouraged daughter to call the 24hour nurse line for any questions over the weekend. Daughter and pt stated they felt more prepared after the [facility] provided supplies and education. RNCM explained Jenna Pierzchala RN CCM would be placing a TOC call to them at the end of next week to check on pt's status. RNCM will make the initial routine home visit on 6/30.   Objective: Blood pressure 124/72, pulse 67, resp. rate 16, SpO2 95 %.  Review of Systems  Genitourinary: Positive for frequency.  and incontinence.   Physical Exam  Constitutional: She is oriented to person, place, and  time. Vital signs are normal. She appears well-developed and well-nourished.  Cardiovascular: Normal rate and regular rhythm.   Pulses:      Radial pulses are 2+ on the right side, and 2+ on the left side.  Respiratory: Effort normal and breath sounds normal.  GI: Soft. Normal appearance and bowel sounds are normal.  Musculoskeletal:       Right ankle: She exhibits swelling.       Feet:  Neurological: She is alert and oriented to person, place, and time.  Skin: Skin is warm and dry.  Daughter reports some skin breakdown to pt's buttocks in which she is applying cream to after showers. Unable to assess at this time.   Psychiatric: She has a normal mood and affect. Her speech is normal and behavior is normal. Judgment and thought content normal. Cognition and memory are normal.    Current Medications:  Meds reviewed with daughter and pill organizer provided. Current Outpatient Prescriptions  Medication Sig Dispense Refill  . acetaminophen (TYLENOL) 325 MG tablet Take 2 tablets (650 mg total) by mouth every 6 (six) hours as needed for mild pain.    Marland Kitchen acetaminophen (TYLENOL) 325 MG tablet Take 650 mg by mouth every 6 (six) hours as needed for mild pain.    Marland Kitchen aspirin 325 MG tablet Take 325 mg by mouth daily.    Marland Kitchen atorvastatin (LIPITOR) 80 MG tablet Take 1 tablet (80 mg total) by mouth daily at 6 PM. 30 tablet 0  . Calcium Carb-Cholecalciferol 600-200  MG-UNIT TABS Take 1 tablet by mouth 2 (two) times daily.     . Calcium Carbonate-Vitamin D (CALCIUM + D) 600-200 MG-UNIT TABS Take 1 tablet by mouth 2 (two) times daily.     . carvedilol (COREG) 3.125 MG tablet Take 1 tablet (3.125 mg total) by mouth 2 (two) times daily with a meal. 60 tablet 0  . colchicine 0.6 MG tablet Take 1 tablet (0.6 mg total) by mouth 2 (two) times daily. 60 tablet 0  . Cyanocobalamin 1000 MCG SUBL Place 1 tablet (1,000 mcg total) under the tongue daily. 90 tablet 3  . cyanocobalamin 1000 MCG tablet Take 100 mcg by mouth  daily.    . furosemide (LASIX) 20 MG tablet Take 1 tablet (20 mg total) by mouth daily. 30 tablet 5  . levothyroxine (SYNTHROID, LEVOTHROID) 25 MCG tablet TAKE 1 TABLET DAILY FOR THYROID 30 tablet 6  . Multiple Vitamin (MULTIVITAMIN WITH MINERALS) TABS tablet Take 1 tablet by mouth daily.    . multivitamin-lutein (OCUVITE-LUTEIN) CAPS capsule Take 1 capsule by mouth daily.    . pantoprazole (PROTONIX) 40 MG tablet Take 1 tablet (40 mg total) by mouth daily. 30 tablet 3  . potassium chloride (MICRO-K) 10 MEQ CR capsule Take 10 mEq by mouth daily. Pt taking liquid potassium not the capsule    . aspirin EC 325 MG EC tablet Take 1 tablet (325 mg total) by mouth daily. 30 tablet 0  . furosemide (LASIX) 40 MG tablet Take 1 tablet (40 mg total) by mouth daily. (Patient not taking: Reported on 11/18/2014) 30 tablet   . levothyroxine (SYNTHROID, LEVOTHROID) 25 MCG tablet Take 25 mcg by mouth daily before breakfast.    . midodrine (PROAMATINE) 5 MG tablet Take 1 tablet (5 mg total) by mouth 3 (three) times daily with meals. (Patient not taking: Reported on 11/18/2014)    . Multiple Vitamin (MULTIVITAMIN) tablet Take 1 tablet by mouth daily.      . Multiple Vitamins-Minerals (PRESERVISION AREDS PO) Take 1 tablet by mouth daily.    . mupirocin ointment (BACTROBAN) 2 % Place 1 application into the nose 2 (two) times daily. (Patient not taking: Reported on 11/18/2014) 22 g 0  . nystatin (MYCOSTATIN) powder Apply topically 2 (two) times daily. (Patient not taking: Reported on 11/18/2014) 15 g 3  . oxybutynin (DITROPAN) 5 MG tablet Take 1 tablet (5 mg total) by mouth 3 (three) times daily. (Patient not taking: Reported on 11/18/2014)    . pantoprazole (PROTONIX) 40 MG tablet Take 40 mg by mouth daily.    . potassium chloride (K-DUR) 10 MEQ tablet Take 1 tablet (10 mEq total) by mouth daily.    . simvastatin (ZOCOR) 40 MG tablet Take 40 mg by mouth at bedtime.    . [DISCONTINUED] citalopram (CELEXA) 20 MG tablet Take  20 mg by mouth daily.       No current facility-administered medications for this visit.    Plan: RNCM will report to [facility] related to TOC.  RNCM will email daughter in law educational EMMI on post op CABG. RNCM will make initial routine home visit on 6/30.  Rutherford Limerick RN, BSN  [facility] Care Management [phone])

## 2014-11-19 NOTE — Patient Outreach (Signed)
Call placed to [facility] related to pt's daughter wanting care to start sooner than previously hired company could provide. Representative assured RNCM that services could be set up to start as soon as this weekend. Rep stated family just needed to get in touch with other company and request they close her mother's case. RNCM will contact daughter and give her the message.  Plan: RNCM will let daughter know someone would be contacting her from [place]. RNCM also let daughter know to call other [facility] agency and let them know the pt had decided to go with another company.  Rutherford Limerick RN, BSN  [facility] Care Management [phone])

## 2014-11-19 NOTE — Patient Outreach (Signed)
Call placed to [facility] SN[facility] to request pt's d/c referral be resent to [place]. Kim the representative form [place] stated she would take care of it. When RNCM talked with representative form [facility] he planned to pick up referral in person, and had received a call from [place] related to this pt.   Rutherford Limerick RN, BSN  [facility] Care Management [phone])

## 2014-11-21 ENCOUNTER — Other Ambulatory Visit: Payer: Self-pay | Admitting: Surgery

## 2014-11-21 ENCOUNTER — Ambulatory Visit: Payer: Self-pay | Admitting: *Deleted

## 2014-11-21 DIAGNOSIS — Z951 Presence of aortocoronary bypass graft: Secondary | ICD-10-CM

## 2014-11-22 DIAGNOSIS — M6281 Muscle weakness (generalized): Secondary | ICD-10-CM | POA: Diagnosis not present

## 2014-11-22 DIAGNOSIS — Z951 Presence of aortocoronary bypass graft: Secondary | ICD-10-CM | POA: Diagnosis not present

## 2014-11-22 DIAGNOSIS — I10 Essential (primary) hypertension: Secondary | ICD-10-CM | POA: Diagnosis not present

## 2014-11-22 DIAGNOSIS — I2510 Atherosclerotic heart disease of native coronary artery without angina pectoris: Secondary | ICD-10-CM | POA: Diagnosis not present

## 2014-11-22 DIAGNOSIS — Z48812 Encounter for surgical aftercare following surgery on the circulatory system: Secondary | ICD-10-CM | POA: Diagnosis not present

## 2014-11-23 ENCOUNTER — Other Ambulatory Visit: Payer: Self-pay | Admitting: *Deleted

## 2014-11-23 ENCOUNTER — Encounter: Payer: Self-pay | Admitting: Surgery

## 2014-11-23 ENCOUNTER — Ambulatory Visit (INDEPENDENT_AMBULATORY_CARE_PROVIDER_SITE_OTHER): Payer: Self-pay | Admitting: Surgery

## 2014-11-23 ENCOUNTER — Ambulatory Visit
Admission: RE | Admit: 2014-11-23 | Discharge: 2014-11-23 | Disposition: A | Discharge status: Home / Self Care | Payer: Commercial Managed Care - HMO | Source: Ambulatory Visit | Attending: Surgery | Admitting: Surgery

## 2014-11-23 ENCOUNTER — Encounter: Payer: Self-pay | Admitting: *Deleted

## 2014-11-23 VITALS — BP 118/71 | HR 76 | Resp 16 | Ht 62.0 in | Wt 182.5 lb

## 2014-11-23 DIAGNOSIS — Z951 Presence of aortocoronary bypass graft: Secondary | ICD-10-CM

## 2014-11-23 DIAGNOSIS — J90 Pleural effusion, not elsewhere classified: Secondary | ICD-10-CM | POA: Diagnosis not present

## 2014-11-23 DIAGNOSIS — I2510 Atherosclerotic heart disease of native coronary artery without angina pectoris: Secondary | ICD-10-CM

## 2014-11-23 NOTE — Patient Outreach (Signed)
Second attempt made (Transition of care)  to contact pt's [name] (on [facility] consent form)  as this RN CM covering for [employer], was informed by [place] RN daughter is best contact for information.   HIPPA compliant voice message left with contact number.  If no response, plan to f/u again tomorrow.       Zara Chess.   [facility] Care Management  [phone]

## 2014-11-23 NOTE — Progress Notes (Signed)
HPI:  Patient returns for routine postoperative follow-up having undergone CABG x 4  on 10/15/2014. The patient's early postoperative recovery while in the hospital was notable for a slow postoperative course due to her age and deconditioning. She ran a low BP for several days and was started on Midodrine to allow [place] to wean her of neosynephrine. Since hospital discharge the patient reports that she was at a SNF but was readmitted to [facility] with generalized weakness and a UTI. She recovered and was discharged back to a SNF. She is now at home with her daughter and is doing well. She is ambulating and doing home PT. She feels stronger every week.   Current Outpatient Prescriptions  Medication Sig Dispense Refill  . acetaminophen (TYLENOL) 325 MG tablet Take 2 tablets (650 mg total) by mouth every 6 (six) hours as needed for mild pain.    Marland Kitchen aspirin EC 325 MG EC tablet Take 1 tablet (325 mg total) by mouth daily. 30 tablet 0  . atorvastatin (LIPITOR) 80 MG tablet Take 1 tablet (80 mg total) by mouth daily at 6 PM. 30 tablet 0  . Calcium Carbonate-Vitamin D (CALCIUM + D) 600-200 MG-UNIT TABS Take 1 tablet by mouth 2 (two) times daily.     . carvedilol (COREG) 3.125 MG tablet Take 1 tablet (3.125 mg total) by mouth 2 (two) times daily with a meal. 60 tablet 0  . colchicine 0.6 MG tablet Take 1 tablet (0.6 mg total) by mouth 2 (two) times daily. 60 tablet 0  . furosemide (LASIX) 20 MG tablet Take 1 tablet (20 mg total) by mouth daily. 30 tablet 5  . levothyroxine (SYNTHROID, LEVOTHROID) 25 MCG tablet TAKE 1 TABLET DAILY FOR THYROID 30 tablet 6  . Multiple Vitamin (MULTIVITAMIN WITH MINERALS) TABS tablet Take 1 tablet by mouth daily.    . Multiple Vitamins-Minerals (PRESERVISION AREDS PO) Take 2 tablets by mouth daily.     . pantoprazole (PROTONIX) 40 MG tablet Take 1 tablet (40 mg total) by mouth daily. 30 tablet 3  . potassium chloride (K-DUR) 10 MEQ tablet Take 10 mEq by mouth daily. Taking  liquid, not tablet    . [DISCONTINUED] citalopram (CELEXA) 20 MG tablet Take 20 mg by mouth daily.       No current facility-administered medications for this visit.    Physical Exam: BP 118/71 mmHg  Pulse 76  Resp 16  Ht 5\' 2"  (1.575 m)  Wt 182 lb 8 oz (82.781 kg)  BMI 33.37 kg/m2  SpO2 97% She looks well. Lung exam is clear. Cardiac exam shows a regular rate and rhythm with normal heart sounds. Chest incision is healing well and sternum is stable. The leg incisions are healing well and there is no peripheral edema.    Diagnostic Tests:  CLINICAL DATA: 81 year old female status post CABG in May with generalized weakness. Subsequent encounter.  EXAM: CHEST 2 VIEW  COMPARISON: 10/19/2014 and earlier  FINDINGS: Left pleural effusion persists but has regressed. Bilateral lung base ventilation is improved. Stable sequelae of CABG. Stable cardiomegaly and mediastinal contours. No pneumothorax or pulmonary edema. No right pleural effusion.  Moderate-sized gastric hiatal hernia. Calcified atherosclerosis of the aorta. No acute osseous abnormality identified.  IMPRESSION: Left pleural effusion persists but has regressed. Improved bibasilar ventilation. No new cardiopulmonary abnormality.   Electronically Signed  By: Genevie Ann M.D.  On: 11/23/2014 14:38   Impression:  Overall I think she is doing well. I encouraged her to continue walking.  I asked her not lift anything heavier than 10 lbs for three months postop.   Plan:  She will continue to follow up with Dr. Derrel Nip and Dr. Fletcher Anon and will let me know if she has any problems with her incisions.    Jenna Pollack, MD [facility] [phone]

## 2014-11-23 NOTE — Patient Outreach (Signed)
Attempt made to contact pt's daughter as part of transition of care as this RN CM covering for [employer].  Was informed by Merlene Morse RN daughter [name] ([place] in [place] on [facility] consent form) best contact for information.   HIPPA compliant voice message left with contact number.   This RN CM to try daughter Cathy's cell phone.      Zara Chess.   [place] Care Management  [phone]

## 2014-11-24 ENCOUNTER — Other Ambulatory Visit: Payer: Self-pay | Admitting: *Deleted

## 2014-11-24 DIAGNOSIS — Z951 Presence of aortocoronary bypass graft: Secondary | ICD-10-CM | POA: Diagnosis not present

## 2014-11-24 DIAGNOSIS — I10 Essential (primary) hypertension: Secondary | ICD-10-CM | POA: Diagnosis not present

## 2014-11-24 DIAGNOSIS — Z48812 Encounter for surgical aftercare following surgery on the circulatory system: Secondary | ICD-10-CM | POA: Diagnosis not present

## 2014-11-24 DIAGNOSIS — I2510 Atherosclerotic heart disease of native coronary artery without angina pectoris: Secondary | ICD-10-CM | POA: Diagnosis not present

## 2014-11-24 DIAGNOSIS — M6281 Muscle weakness (generalized): Secondary | ICD-10-CM | POA: Diagnosis not present

## 2014-11-24 NOTE — Patient Outreach (Signed)
Transition of care call:  Spoke with pt, HIPPA verified.  Informed pt this RN CM covering for [employer], calling to check on status (part of ongoing transition of care).  Pt reports HH PT came yesterday, coming today- feeling stronger.  Pt states saw surgeon Dr. Cyndia Bent yesterday, said everything looked great.  RN CM inquired about her weights to which she said do not have a scale (something wrong with it), friend to bring one tomorrow.  Pt states not gaining, no swelling, no sob- daughter in background said everything is fine.  Pt reports  watching her sodium, taking all of her medications.  RN CM informed pt Janci RN scheduled to see her 6/30- home visit.     Plan to provide Janci RN an update upon her return.   Zara Chess.   [place] Care Management  [phone]

## 2014-11-25 ENCOUNTER — Other Ambulatory Visit: Payer: Self-pay | Admitting: *Deleted

## 2014-11-25 NOTE — Patient Outreach (Signed)
[name]) Care Management  [facility] Social Work  11/25/2014  Jenna Marshall 05-Aug-1925 [id number]   Current Medications:  Current Outpatient Prescriptions  Medication Sig Dispense Refill  . acetaminophen (TYLENOL) 325 MG tablet Take 2 tablets (650 mg total) by mouth every 6 (six) hours as needed for mild pain.    Marland Kitchen aspirin EC 325 MG EC tablet Take 1 tablet (325 mg total) by mouth daily. 30 tablet 0  . atorvastatin (LIPITOR) 80 MG tablet Take 1 tablet (80 mg total) by mouth daily at 6 PM. 30 tablet 0  . Calcium Carbonate-Vitamin D (CALCIUM + D) 600-200 MG-UNIT TABS Take 1 tablet by mouth 2 (two) times daily.     . carvedilol (COREG) 3.125 MG tablet Take 1 tablet (3.125 mg total) by mouth 2 (two) times daily with a meal. 60 tablet 0  . colchicine 0.6 MG tablet Take 1 tablet (0.6 mg total) by mouth 2 (two) times daily. 60 tablet 0  . furosemide (LASIX) 20 MG tablet Take 1 tablet (20 mg total) by mouth daily. 30 tablet 5  . levothyroxine (SYNTHROID, LEVOTHROID) 25 MCG tablet TAKE 1 TABLET DAILY FOR THYROID 30 tablet 6  . Multiple Vitamin (MULTIVITAMIN WITH MINERALS) TABS tablet Take 1 tablet by mouth daily.    . Multiple Vitamins-Minerals (PRESERVISION AREDS PO) Take 2 tablets by mouth daily.     . pantoprazole (PROTONIX) 40 MG tablet Take 1 tablet (40 mg total) by mouth daily. 30 tablet 3  . potassium chloride (K-DUR) 10 MEQ tablet Take 10 mEq by mouth daily. Taking liquid, not tablet    . [DISCONTINUED] citalopram (CELEXA) 20 MG tablet Take 20 mg by mouth daily.       No current facility-administered medications for this visit.    Functional Status:  In your present state of health, do you have any difficulty performing the following activities: 11/01/2014 10/22/2014  Hearing? Y N  Vision? N N  Difficulty concentrating or making decisions? N N  Walking or climbing stairs? Y Y  Dressing or bathing? Y N  Doing errands, shopping? Y N  Preparing Food and eating ? N -   Using the Toilet? Y -  In the past six months, have you accidently leaked urine? Y -  Do you have problems with loss of bowel control? N -  Managing your Medications? Y -  Managing your Finances? Y -  Housekeeping or managing your Housekeeping? Y -    Fall/Depression Screening:  PHQ 2/9 Scores 11/01/2014 09/29/2014  PHQ - 2 Score 0 0    Assessment:  This social worker visited patient in her home to assess for continued social work needs post discharge from the skilled nursing facility.  Daughter [name] present. Per patient, she is progressing well and actively participating  in physical and occupational therapy with [facility].  Per patient's daughter, there was an adjustment period when patient first returned home, however once home health became involved, patiient is progressing and is slowly gaining her independence.  Patient has strong family support.  At this time patient's daughter is staying with her during the week and her sister and brother switch off on the weekends spending the night.  Per patient's daughter, they will keep this schedule for the next couple of weeks and will then work to slowly reduce the overnight stays. Patient has only short periods of the day when she is alone.  Per patient's daughter, they are looking into possibly hiring an aid  for patient as well. Referrals to in home aid agencies previously provided.     Throw rugs have been removed to reduce fall.  Patient has safety rails, a shower bench in the bathroom  a raised toilet seat as well as a lift chair..  ,Patient has a good appetite and they are incorporating healthier meals into her diet.  Patient needs to weigh daily and needs a scale.  Small periods of time alone.     Patient's daughter manages patient's medications and was concerned about the liquid potassium and patient not being able to read the measurements on the cap.  They may be looking into the pill form for future use  Plan:  Per  patient and daughter, patient has no further social work needs.  Patient's case to be closed to social work. Request made for RNCM to bring patient a scale on her next visit.     [name] [facility] Care Management [phone]

## 2014-11-28 DIAGNOSIS — Z48812 Encounter for surgical aftercare following surgery on the circulatory system: Secondary | ICD-10-CM | POA: Diagnosis not present

## 2014-11-28 DIAGNOSIS — Z951 Presence of aortocoronary bypass graft: Secondary | ICD-10-CM | POA: Diagnosis not present

## 2014-11-28 DIAGNOSIS — M6281 Muscle weakness (generalized): Secondary | ICD-10-CM | POA: Diagnosis not present

## 2014-11-28 DIAGNOSIS — I2510 Atherosclerotic heart disease of native coronary artery without angina pectoris: Secondary | ICD-10-CM | POA: Diagnosis not present

## 2014-11-28 DIAGNOSIS — I10 Essential (primary) hypertension: Secondary | ICD-10-CM | POA: Diagnosis not present

## 2014-11-30 ENCOUNTER — Ambulatory Visit (INDEPENDENT_AMBULATORY_CARE_PROVIDER_SITE_OTHER): Payer: Commercial Managed Care - HMO | Admitting: Nurse Practitioner

## 2014-11-30 VITALS — BP 102/60 | HR 71 | Temp 98.0°F | Resp 14 | Ht 62.0 in | Wt 182.4 lb

## 2014-11-30 DIAGNOSIS — Z951 Presence of aortocoronary bypass graft: Secondary | ICD-10-CM

## 2014-11-30 DIAGNOSIS — I10 Essential (primary) hypertension: Secondary | ICD-10-CM | POA: Diagnosis not present

## 2014-11-30 DIAGNOSIS — M6281 Muscle weakness (generalized): Secondary | ICD-10-CM | POA: Diagnosis not present

## 2014-11-30 DIAGNOSIS — Z48812 Encounter for surgical aftercare following surgery on the circulatory system: Secondary | ICD-10-CM | POA: Diagnosis not present

## 2014-11-30 MED ORDER — TRIAMCINOLONE ACETONIDE 0.1 % EX CREA: 1.0000 "application " | TOPICAL_CREAM | Freq: Two times a day (BID) | CUTANEOUS | Status: DC

## 2014-11-30 NOTE — Progress Notes (Signed)
Pre visit review using our clinic review tool, if applicable. No additional management support is needed unless otherwise documented below in the visit note. 

## 2014-11-30 NOTE — Progress Notes (Signed)
   Subjective:    Patient ID: Jenna Marshall, female    DOB: April 24, 1926, 81 y.o.   MRN: [id number]  HPI  Jenna Marshall is a 81 yo female following up from heart surgery. She is accompanied by her daughter today.   1) Therapy at home twice a week and working on the exercises between sessions. Itching across shoulders, during the night mostly. No treatment to date. No other concerns or questions today.   Review of Systems  Constitutional: Negative for fever, chills, diaphoresis and fatigue.  Respiratory: Negative for chest tightness, shortness of breath and wheezing.   Cardiovascular: Negative for chest pain, palpitations and leg swelling.  Gastrointestinal: Negative for nausea, vomiting and diarrhea.  Skin: Negative for rash.       Itching  Neurological: Negative for dizziness, weakness, numbness and headaches.  Psychiatric/Behavioral: The patient is not nervous/anxious.       Objective:   Physical Exam  Constitutional: She is oriented to person, place, and time. She appears well-developed and well-nourished. No distress.  HENT:  Head: Normocephalic and atraumatic.  Right Ear: External ear normal.  Left Ear: External ear normal.  Cardiovascular: Normal rate, regular rhythm, normal heart sounds and intact distal pulses.  Exam reveals no gallop and no friction rub.   No murmur heard. Pulmonary/Chest: Effort normal and breath sounds normal. No respiratory distress. She has no wheezes. She has no rales. She exhibits no tenderness.  Neurological: She is alert and oriented to person, place, and time. No cranial nerve deficit. She exhibits normal muscle tone. Coordination normal.  Skin: Skin is warm and dry. No rash noted. She is not diaphoretic.  Psychiatric: She has a normal mood and affect. Her behavior is normal. Judgment and thought content normal.      Assessment & Plan:

## 2014-11-30 NOTE — Patient Instructions (Signed)
Please try the cream twice daily on itchy sites.   Benadryl at night as needed for itching.   Continue to eat a low salt diet, lots of veggies, and good water intake.   Call us if you need anything.

## 2014-12-01 ENCOUNTER — Other Ambulatory Visit: Payer: Self-pay | Admitting: *Deleted

## 2014-12-01 DIAGNOSIS — M6281 Muscle weakness (generalized): Secondary | ICD-10-CM | POA: Diagnosis not present

## 2014-12-01 DIAGNOSIS — Z48812 Encounter for surgical aftercare following surgery on the circulatory system: Secondary | ICD-10-CM | POA: Diagnosis not present

## 2014-12-01 DIAGNOSIS — Z951 Presence of aortocoronary bypass graft: Secondary | ICD-10-CM | POA: Diagnosis not present

## 2014-12-01 DIAGNOSIS — I10 Essential (primary) hypertension: Secondary | ICD-10-CM | POA: Diagnosis not present

## 2014-12-01 DIAGNOSIS — I2510 Atherosclerotic heart disease of native coronary artery without angina pectoris: Secondary | ICD-10-CM | POA: Diagnosis not present

## 2014-12-01 NOTE — Patient Outreach (Signed)
[facility]) Care Management   12/01/2014  [place] 1925-12-20 [id number]  Jenna Marshall is an 81 y.o. female  Subjective: "I miss being outside planting in my yard and doing my own housework." "I need to know what to watch for with this new heart surgery." "I want to get my independence back."  Objective:Blood pressure 112/60, pulse 60, resp. rate 18, height 1.575 m (5' 2"), weight 179 lb (81.194 kg), SpO2 95 %.   Review of Systems  Respiratory: Negative for shortness of breath.   Cardiovascular: Negative for chest pain and leg swelling.  Musculoskeletal: Negative for falls.  Skin: Positive for itching.  All other systems reviewed and are negative.   Physical Exam  Constitutional: She is oriented to person, place, and time. She appears well-developed and well-nourished.  Cardiovascular: Normal rate and regular rhythm.   Pulses:      Dorsalis pedis pulses are 2+ on the right side, and 2+ on the left side.  Respiratory: Effort normal. She has decreased breath sounds in the right lower field.  GI: Soft. Bowel sounds are normal.  Musculoskeletal: Normal range of motion.  Neurological: She is alert and oriented to person, place, and time.  Skin: Skin is warm and dry.  Healing incision from CABG in May noted. No s/s of infection.  Scratches noted to upper back from pt feeling "itchy" on her shoulders. Pt states resolved by cream ordered by PCP office.    Current Medications:   Current Outpatient Prescriptions  Medication Sig Dispense Refill  . acetaminophen (TYLENOL) 325 MG tablet Take 2 tablets (650 mg total) by mouth every 6 (six) hours as needed for mild pain.    Marland Kitchen aspirin EC 325 MG EC tablet Take 1 tablet (325 mg total) by mouth daily. 30 tablet 0  . atorvastatin (LIPITOR) 80 MG tablet Take 1 tablet (80 mg total) by mouth daily at 6 PM. 30 tablet 0  . Calcium Carbonate-Vitamin D (CALCIUM + D) 600-200 MG-UNIT TABS Take 1 tablet by mouth 2 (two) times  daily.     . carvedilol (COREG) 3.125 MG tablet Take 1 tablet (3.125 mg total) by mouth 2 (two) times daily with a meal. 60 tablet 0  . colchicine 0.6 MG tablet Take 1 tablet (0.6 mg total) by mouth 2 (two) times daily. 60 tablet 0  . furosemide (LASIX) 20 MG tablet Take 1 tablet (20 mg total) by mouth daily. 30 tablet 5  . levothyroxine (SYNTHROID, LEVOTHROID) 25 MCG tablet TAKE 1 TABLET DAILY FOR THYROID 30 tablet 6  . Multiple Vitamin (MULTIVITAMIN WITH MINERALS) TABS tablet Take 1 tablet by mouth daily.    . Multiple Vitamins-Minerals (PRESERVISION AREDS PO) Take 2 tablets by mouth daily.     . pantoprazole (PROTONIX) 40 MG tablet Take 1 tablet (40 mg total) by mouth daily. 30 tablet 3  . POTASSIUM CHLORIDE PO Take 7.5 mLs by mouth daily. Liquid form 10 mEq in 7.5 mL    . triamcinolone cream (KENALOG) 0.1 % Apply 1 application topically 2 (two) times daily. 45 g 0  . [DISCONTINUED] citalopram (CELEXA) 20 MG tablet Take 20 mg by mouth daily.       No current facility-administered medications for this visit.    Functional Status:   In your present state of health, do you have any difficulty performing the following activities: 12/01/2014 11/01/2014  Hearing? N Y  Vision? N N  Difficulty concentrating or making decisions? N N  Walking or climbing  stairs? Y Y  Dressing or bathing? Y Y  Doing errands, shopping? Tempie Donning  Preparing Food and eating ? Y N  Using the Toilet? N Y  In the past six months, have you accidently leaked urine? Y Y  Do you have problems with loss of bowel control? N N  Managing your Medications? Y Y  Managing your Finances? Tempie Donning  Housekeeping or managing your Housekeeping? Tempie Donning    Fall/Depression Screening:    PHQ 2/9 Scores 12/01/2014 11/01/2014 09/29/2014  PHQ - 2 Score 0 0 0   [facility] CM Care Plan Problem One        Patient Outreach from 11/25/2014 in [place] CM Short Term Goal #1 Met Date  11/07/14   [facility] CM Short Term Goal #2 Met Date  11/07/14     [facility] CM Care Plan Problem Two        Patient Outreach from 12/01/2014 in [place] Problem Two  Pt has goals to get back to doing her own house work and yard work after CABG surgery in May.   Care Plan for Problem Two  Active   Interventions for Problem Two Long Term Goal   Education on CAD and HF s/s done by [facility]. Discussion about gaining independance in her home.   [facility] Long Term Goal (31-90) days  Pt will not be admitted to the hospital with s/s of HF or CAD in the next 90 days.   [facility]N Long Term Goal Start Date  12/01/14   [facility] CM Short Term Goal #1 (0-30 days)  Pt will be able to name her medicines, times and purposes for each in the next 30 days.   [facility] CM Short Term Goal #1 Start Date  12/01/14   Interventions for Short Term Goal #2   Discussed the importance of knowing medicine for independance.   [facility] CM Short Term Goal #2 (0-30 days)  Pt will be able to name the s/s of heart hf by the next RNCM visit in the next 30days.   [facility] CM Short Term Goal #2 Start Date  12/01/14   Interventions for Short Term Goal #2  RNCM will print and provide education on HF and discuss s/s of CAD. Scale provided to pt for daily weights.     Assessment: Alert and oriented x3, pleasant talkative pt. Lungs noted to be clear, with decreased breath sounds noted to lower right field. Incision healing well. Bilat lower extremities without edema + pedal pulses. Pt denies weight gain or SOB. (new scale provided to pt per her request.) Daughter present and supportive stating her other siblings supportive also. Pt stating she is feeling stronger and ready to get back having her independence. Pt continuing with PT and doing well according to PT who was present at the end of visit. Pt has a well equipped handicap bathroom. Using a walker presently, but feels she will be able to graduate to a cane shortly.Pt denies financial barriers at this time. RNCM talked with pt about becoming familiar with her  medications which she used to manage herself, but since her hospitalization has allowed her daughter to handle. Pt wants to get back to cooking for herself, EMMI education printed and given to pt to review related to heart healthy diet. Pt in good spirits and motivated to regain independence.   Plan:  RNCM will see pt in 30 days and discuss possible discharge to telephonic health coach.  RNCM will make PCP aware of [facility] involvement.   Rutherford Limerick RN, BSN  [facility] Care Management [phone])

## 2014-12-06 DIAGNOSIS — I2510 Atherosclerotic heart disease of native coronary artery without angina pectoris: Secondary | ICD-10-CM | POA: Diagnosis not present

## 2014-12-06 DIAGNOSIS — M6281 Muscle weakness (generalized): Secondary | ICD-10-CM | POA: Diagnosis not present

## 2014-12-06 DIAGNOSIS — Z951 Presence of aortocoronary bypass graft: Secondary | ICD-10-CM | POA: Diagnosis not present

## 2014-12-06 DIAGNOSIS — I10 Essential (primary) hypertension: Secondary | ICD-10-CM | POA: Diagnosis not present

## 2014-12-06 DIAGNOSIS — Z48812 Encounter for surgical aftercare following surgery on the circulatory system: Secondary | ICD-10-CM | POA: Diagnosis not present

## 2014-12-08 DIAGNOSIS — Z48812 Encounter for surgical aftercare following surgery on the circulatory system: Secondary | ICD-10-CM | POA: Diagnosis not present

## 2014-12-08 DIAGNOSIS — I10 Essential (primary) hypertension: Secondary | ICD-10-CM | POA: Diagnosis not present

## 2014-12-08 DIAGNOSIS — Z951 Presence of aortocoronary bypass graft: Secondary | ICD-10-CM | POA: Diagnosis not present

## 2014-12-08 DIAGNOSIS — I2510 Atherosclerotic heart disease of native coronary artery without angina pectoris: Secondary | ICD-10-CM | POA: Diagnosis not present

## 2014-12-08 DIAGNOSIS — M6281 Muscle weakness (generalized): Secondary | ICD-10-CM | POA: Diagnosis not present

## 2014-12-09 ENCOUNTER — Other Ambulatory Visit: Payer: Self-pay | Admitting: Internal Medicine

## 2014-12-12 ENCOUNTER — Telehealth: Payer: Self-pay | Admitting: *Deleted

## 2014-12-12 DIAGNOSIS — I2510 Atherosclerotic heart disease of native coronary artery without angina pectoris: Secondary | ICD-10-CM | POA: Diagnosis not present

## 2014-12-12 DIAGNOSIS — I10 Essential (primary) hypertension: Secondary | ICD-10-CM | POA: Diagnosis not present

## 2014-12-12 DIAGNOSIS — Z48812 Encounter for surgical aftercare following surgery on the circulatory system: Secondary | ICD-10-CM | POA: Diagnosis not present

## 2014-12-12 DIAGNOSIS — M6281 Muscle weakness (generalized): Secondary | ICD-10-CM | POA: Diagnosis not present

## 2014-12-12 DIAGNOSIS — Z951 Presence of aortocoronary bypass graft: Secondary | ICD-10-CM | POA: Diagnosis not present

## 2014-12-12 NOTE — Telephone Encounter (Signed)
  1. Which medications need to be refilled? Potassium (liquid) 7.68ml  2. Which pharmacy is medication to be sent to?  [facility]  3. Do they need a 30 day or 90 day supply? -0 day  4. Would they like a call back once the medication has been sent to the pharmacy? Yes

## 2014-12-13 ENCOUNTER — Other Ambulatory Visit: Payer: Self-pay

## 2014-12-13 ENCOUNTER — Other Ambulatory Visit: Payer: Self-pay | Admitting: Internal Medicine

## 2014-12-13 ENCOUNTER — Other Ambulatory Visit: Payer: Self-pay | Admitting: *Deleted

## 2014-12-13 NOTE — Telephone Encounter (Signed)
That's fine

## 2014-12-13 NOTE — Telephone Encounter (Signed)
Pt is taking 7.5 ml potassium liquid form.

## 2014-12-13 NOTE — Telephone Encounter (Signed)
Please advise correct dosage spoke with pt and she is not sure what dosage. She did mention 7.5 ml but not sure if correct. She mentioned daughter will contact office to discuss correct dose.

## 2014-12-13 NOTE — Telephone Encounter (Signed)
Pt requesting refill potassium liquid okay to refill, Please advise.  Last filled by PCP.

## 2014-12-14 ENCOUNTER — Telehealth: Payer: Self-pay

## 2014-12-14 ENCOUNTER — Other Ambulatory Visit: Payer: Self-pay | Admitting: *Deleted

## 2014-12-14 ENCOUNTER — Other Ambulatory Visit: Payer: Self-pay

## 2014-12-14 ENCOUNTER — Encounter: Payer: Self-pay | Admitting: Nurse Practitioner

## 2014-12-14 ENCOUNTER — Other Ambulatory Visit: Payer: Self-pay | Admitting: Pharmacist

## 2014-12-14 DIAGNOSIS — Z79899 Other long term (current) drug therapy: Secondary | ICD-10-CM

## 2014-12-14 NOTE — Patient Outreach (Signed)
Ciro Backer daughter called for help obtaining the potassium her mom was d/c'd on from [facility]. Daughter stated pt's pharmacy [facility] had no record of this script and would need the Dr to calling a new one. Daughter was concerned she would not get it before her bottle ran out. Daughter stated she had called the doctor and pharmacy and was calling RNCM to see if I could make any head way with it.   Plan: Communicate with Raynelle Dick Pharm D for assistance with this process.  Call [place] to see if they can help.  Rutherford Limerick RN, BSN  [facility] Care Management [phone])

## 2014-12-14 NOTE — Patient Outreach (Signed)
RNCM talked with pt's daughter cathy and informed her our [place] had confirmed the potassium dose with her MD office and the MD office had stated they would call in the prescription. Daughter expressed gratitude to pharmacist and [facility] for [facility] assistance in this matter.   Plan: RNCM will see pt in her home on already set date.  Rutherford Limerick RN, BSN  [facility] Care Management [phone])

## 2014-12-14 NOTE — Patient Outreach (Signed)
Called to follow up with Nurse Care Manager Janci Minor about patient's potassium chloride. Let her know that I spoke with Ivin Booty in his office and let her know that per Neshell at [facility], the patient was discharged on potassium chloride 20 MEQ/15 mL, taking 7.5 mL by mouth once daily. Merlene Morse states that she will now follow up with the patient's daughter to let her know that Dr. Tyrell Antonio office will now be calling this into [facility] for the patient.  Harlow Asa, PharmD Clinical Pharmacist [place] [facility] [phone]

## 2014-12-14 NOTE — Telephone Encounter (Signed)
S/w Benjamine Mola, pharmacist with [place] [facility], who states she spoke with [facility] to verify that pt takes potassium 43mEq in 7.110ml (1/2 dose of 35mEq in 36ml)

## 2014-12-14 NOTE — Patient Outreach (Signed)
Spoke with pharmacist about potassium. Pharmacist Renee has no record of this medication and suggested finding out from the MD.  Plan: Communicate with Timmothy Sours RN, BSN  [place] [facility] [phone])

## 2014-12-14 NOTE — Patient Outreach (Signed)
Jenna Marshall is a 81 y.o. female referred to pharmacy for assistance with getting her potassium liquid. Per referral from [place] [facility], patient was discharged from nursing facility, [facility] on potassium liquid. Patient now only has a two day supply left. She requested a refill from her Cardiologist, Dr. Avel Sensor. However, per call documentation notes in EPIC, Dr. April Holding is needing further information about the strength and dosage form that the patient has been on. Called [facility] at [phone] to confirm this dose.   Spoke with Neshell at the facility who reports that the patient was on potassium chloride 20 MEQ/15 mL, taking 7.5 mL by mouth once daily. Will now follow up with Dr. Michaelle Birks office to let him know the strength that the patient has been on.  Harlow Asa, PharmD Clinical Pharmacist [place] [facility] [phone]

## 2014-12-14 NOTE — Telephone Encounter (Signed)
[facility]. States they have not filled prescription for liquid potassium for patient. Pt recently discharged from rehab and appears to have been getting liquid there however dosage noted in chart is not typical dosage. Will forward to Dr. Fletcher Anon

## 2014-12-14 NOTE — Patient Outreach (Signed)
Called to follow up with Dr. Fletcher Anon about the strength of patient's potassium chloride. Spoke with Ivin Booty in his office and let her know that per Neshell at [facility], the patient was discharged on potassium chloride 20 MEQ/15 mL, taking 7.5 mL by mouth once daily. Will now follow up with [place] Minor and let her know that the office will now be calling this into [facility] for the patient.  Harlow Asa, PharmD Clinical Pharmacist [place] [facility] [phone]

## 2014-12-14 NOTE — Telephone Encounter (Signed)
S/w Sam, pharmacist at [facility], who called stating concerns regarding liquid potassium and accuracy of dosing at home. Liquid potassium comes 32mEq/15ml. Prescribed amount 34mEq/7.5ml.  Pharmacist concerned patient may not receive correct amount when dispensing at home. Pharmacist suggests ordering 69mEq CAPSULES and sprinkling 1/2 capsule in applesauce once per day.   Will forward to [place]

## 2014-12-14 NOTE — Assessment & Plan Note (Signed)
Pt is doing well. She is attending therapy twice weekly and doing her exercises as recommended. No further questions or concerns. Encouraged her to eat a heart healthy diet. She is having itching of her back for with I sent in triamcinolone cream. FU prn worsening/failure to improve.

## 2014-12-15 ENCOUNTER — Other Ambulatory Visit: Payer: Self-pay

## 2014-12-15 ENCOUNTER — Telehealth: Payer: Self-pay | Admitting: *Deleted

## 2014-12-15 DIAGNOSIS — I2510 Atherosclerotic heart disease of native coronary artery without angina pectoris: Secondary | ICD-10-CM | POA: Diagnosis not present

## 2014-12-15 DIAGNOSIS — Z951 Presence of aortocoronary bypass graft: Secondary | ICD-10-CM | POA: Diagnosis not present

## 2014-12-15 DIAGNOSIS — I10 Essential (primary) hypertension: Secondary | ICD-10-CM | POA: Diagnosis not present

## 2014-12-15 DIAGNOSIS — Z48812 Encounter for surgical aftercare following surgery on the circulatory system: Secondary | ICD-10-CM | POA: Diagnosis not present

## 2014-12-15 DIAGNOSIS — M6281 Muscle weakness (generalized): Secondary | ICD-10-CM | POA: Diagnosis not present

## 2014-12-15 MED ORDER — POTASSIUM CHLORIDE ER 10 MEQ PO TBCR: 10.0000 meq | EXTENDED_RELEASE_TABLET | Freq: Every day | ORAL | Status: DC

## 2014-12-15 MED ORDER — POTASSIUM CHLORIDE CRYS ER 20 MEQ PO TBCR: 10.0000 meq | EXTENDED_RELEASE_TABLET | Freq: Every day | ORAL | Status: DC

## 2014-12-15 NOTE — Telephone Encounter (Signed)
That is fine. I want her to get 10 meq once daily. I really don't care which form it is in.

## 2014-12-15 NOTE — Telephone Encounter (Signed)
S/w pharmacist who states they have 43mEq CAPSULES which is what was requested. New prescription sent in to reflect capsules

## 2014-12-15 NOTE — Patient Outreach (Signed)
[facility]) Care Management  12/15/2014  Jenna Marshall April 07, 1926 [id number]   Request from [place], RN to assign Pharmacy, Harlow Asa, PharmD assigned to outreach.  Jenna Marshall. Mier, [place] [facility] [place] Assistant Phone: [phone] Fax: [phone]

## 2014-12-15 NOTE — Telephone Encounter (Signed)
Please clarify instructions

## 2014-12-15 NOTE — Telephone Encounter (Signed)
Pharmacy calling asking if we can do a 10 instead of a 5  Please either call or re-send it in, it is on Potassium extended release.

## 2014-12-19 ENCOUNTER — Other Ambulatory Visit: Payer: Self-pay | Admitting: *Deleted

## 2014-12-19 NOTE — Patient Outreach (Signed)
RNCM called pt and talked with pt's daughter as part of the [facility] program. Daughter stated they were able to get potassium ordered and filled this week. She stated pt was doing well with PT and was not having SOB. Daily weights remained steady each day. Daughter reports mother has just a small amount of swelling to bilateral lower extremities. Daughter reports she and her mother are pleased with how well she is recovering. RNCM discussed with daughter that this was my last TOC call but confirmed that she could contact me with any needs.  Plan: RNCM will see pt at the already scheduled appointment 7/29.  Rutherford Limerick RN, BSN  [facility] Care Management [phone])

## 2014-12-20 DIAGNOSIS — I2510 Atherosclerotic heart disease of native coronary artery without angina pectoris: Secondary | ICD-10-CM | POA: Diagnosis not present

## 2014-12-20 DIAGNOSIS — I10 Essential (primary) hypertension: Secondary | ICD-10-CM | POA: Diagnosis not present

## 2014-12-20 DIAGNOSIS — Z48812 Encounter for surgical aftercare following surgery on the circulatory system: Secondary | ICD-10-CM | POA: Diagnosis not present

## 2014-12-20 DIAGNOSIS — Z951 Presence of aortocoronary bypass graft: Secondary | ICD-10-CM | POA: Diagnosis not present

## 2014-12-20 DIAGNOSIS — M6281 Muscle weakness (generalized): Secondary | ICD-10-CM | POA: Diagnosis not present

## 2014-12-23 ENCOUNTER — Other Ambulatory Visit (INDEPENDENT_AMBULATORY_CARE_PROVIDER_SITE_OTHER): Payer: Commercial Managed Care - HMO | Admitting: *Deleted

## 2014-12-23 DIAGNOSIS — I10 Essential (primary) hypertension: Secondary | ICD-10-CM | POA: Diagnosis not present

## 2014-12-23 DIAGNOSIS — Z951 Presence of aortocoronary bypass graft: Secondary | ICD-10-CM

## 2014-12-24 LAB — LIPID PANEL
Chol/HDL Ratio: 2.9 ratio units (ref 0.0–4.4)
Cholesterol, Total: 114 mg/dL (ref 100–199)
HDL: 40 mg/dL (ref >39)
LDL CALC: 56 mg/dL (ref 0–99)
Triglycerides: 88 mg/dL (ref 0–149)
VLDL CHOLESTEROL CAL: 18 mg/dL (ref 5–40)

## 2014-12-24 LAB — BASIC METABOLIC PANEL
BUN/Creatinine Ratio: 28 — ABNORMAL HIGH (ref 11–26)
BUN: 26 mg/dL (ref 8–27)
CO2: 21 mmol/L (ref 18–29)
Calcium: 9.1 mg/dL (ref 8.7–10.3)
Chloride: 98 mmol/L (ref 97–108)
Creatinine, Ser: 0.94 mg/dL (ref 0.57–1.00)
GFR calc non Af Amer: 55 mL/min/{1.73_m2} — ABNORMAL LOW (ref >59)
GFR, EST AFRICAN AMERICAN: 63 mL/min/{1.73_m2} (ref >59)
GLUCOSE: 99 mg/dL (ref 65–99)
POTASSIUM: 4.8 mmol/L (ref 3.5–5.2)
SODIUM: 141 mmol/L (ref 134–144)

## 2014-12-24 LAB — HEPATIC FUNCTION PANEL
ALK PHOS: 78 IU/L (ref 39–117)
ALT: 61 IU/L — ABNORMAL HIGH (ref 0–32)
AST: 71 IU/L — AB (ref 0–40)
Albumin: 4.1 g/dL (ref 3.5–4.7)
BILIRUBIN, DIRECT: 0.14 mg/dL (ref 0.00–0.40)
Bilirubin Total: 0.4 mg/dL (ref 0.0–1.2)
Total Protein: 6.6 g/dL (ref 6.0–8.5)

## 2014-12-26 ENCOUNTER — Other Ambulatory Visit: Payer: Self-pay

## 2014-12-26 DIAGNOSIS — H3532 Exudative age-related macular degeneration: Secondary | ICD-10-CM | POA: Diagnosis not present

## 2014-12-26 MED ORDER — ATORVASTATIN CALCIUM 40 MG PO TABS: 40.0000 mg | ORAL_TABLET | Freq: Every day | ORAL | Status: DC

## 2014-12-27 DIAGNOSIS — I2510 Atherosclerotic heart disease of native coronary artery without angina pectoris: Secondary | ICD-10-CM | POA: Diagnosis not present

## 2014-12-27 DIAGNOSIS — Z951 Presence of aortocoronary bypass graft: Secondary | ICD-10-CM | POA: Diagnosis not present

## 2014-12-27 DIAGNOSIS — I10 Essential (primary) hypertension: Secondary | ICD-10-CM | POA: Diagnosis not present

## 2014-12-27 DIAGNOSIS — Z48812 Encounter for surgical aftercare following surgery on the circulatory system: Secondary | ICD-10-CM | POA: Diagnosis not present

## 2014-12-27 DIAGNOSIS — M6281 Muscle weakness (generalized): Secondary | ICD-10-CM | POA: Diagnosis not present

## 2014-12-29 ENCOUNTER — Encounter: Payer: Self-pay | Admitting: Cardiovascular Disease

## 2014-12-29 ENCOUNTER — Ambulatory Visit (INDEPENDENT_AMBULATORY_CARE_PROVIDER_SITE_OTHER): Payer: Commercial Managed Care - HMO | Admitting: Cardiovascular Disease

## 2014-12-29 VITALS — BP 134/78 | HR 80 | Ht 64.0 in | Wt 181.5 lb

## 2014-12-29 DIAGNOSIS — I2510 Atherosclerotic heart disease of native coronary artery without angina pectoris: Secondary | ICD-10-CM

## 2014-12-29 DIAGNOSIS — I10 Essential (primary) hypertension: Secondary | ICD-10-CM

## 2014-12-29 DIAGNOSIS — L270 Generalized skin eruption due to drugs and medicaments taken internally: Secondary | ICD-10-CM | POA: Diagnosis not present

## 2014-12-29 DIAGNOSIS — E785 Hyperlipidemia, unspecified: Secondary | ICD-10-CM

## 2014-12-29 NOTE — Assessment & Plan Note (Signed)
Lab Results  Component Value Date   CHOL 114 12/23/2014   HDL 40 12/23/2014   LDLCALC 56 12/23/2014   LDLDIRECT 104.4 02/27/2011   TRIG 88 12/23/2014   CHOLHDL 2.9 12/23/2014   LDL was 56 and liver profile was mildly abnormal. Thus, I decreased the dose of atorvastatin 40 mg once daily.

## 2014-12-29 NOTE — Assessment & Plan Note (Signed)
Blood pressure is controlled on small dose carvedilol. She is no longer hypotensive after stopping losartan.

## 2014-12-29 NOTE — Assessment & Plan Note (Signed)
Likely due to either furosemide or colchicine. Less likely to be due to carvedilol. I elected to discontinue furosemide as outlined above. She also has not had any episodes of gout since her most recent hospitalization. Thus, I discontinued colchicine.

## 2014-12-29 NOTE — Progress Notes (Signed)
HPI  81 year old female with history of CAD s/p recent 4 vessel CABG on 3/00/7622, diastolic dysfunction, hypertension, and hyperlipidemia who is here today for a follow-up visit.  She was admitted to [facility] on 5/10 with a several week history of intermittent sub sternal chest pain and aching in the forearm muscles bilaterally that was occuring with exertion and at rest. She ruled out for MI. She had a high risk nuclear stress test (EF 41%) on 10/13/2014 and catheterization on 10/14/2014 showing 99% distal LM stenosis and significant LAD and RCA stenosis. An echo showed an LVEF of 63-33% and diastolic dysfunction.  She was transferred to [facility] on 5/13 for urgent CABG . She underwent a CABG x 4 on 10/15/2014. Successful LIMA-->LAD, SVG-->RCA, and sequential SVG to Ramus and OM. She remained afebrile and hemodynamically stable.  She was placed on Midodrin for hypotension. She presented to [facility] on 5/21 with increased weakness and inability to get out of bed to ambulate. She was also noted to have a UTI. CXR showed moderate left pleural effusion and small to moderate sized right pleural effusion with bibasilar effusion with associated atelectasis. PNA cannot be excluded. Tropnon 0.06. She improved with diuresis. Midodrin was discontinued. She was discharged to a skilled nursing facility but now she is back home. She has been doing very well and denies any chest pain or shortness of breath. Her weight is   stable with no significant changes. Blood pressure has been good at home. Her only complaint is generalized rash and itching which she thinks is due to some of the medications she is taking. Most recent medications that were added include colchicine for gout and furosemide.    Allergies  Allergen Reactions  . Ambien [Zolpidem] Other (See Comments)    Hallucinations  . Codeine Nausea Only  . Codeine Nausea And Vomiting  . Codeine Sulfate Hives     Current Outpatient Prescriptions on File  Prior to Visit  Medication Sig Dispense Refill  . acetaminophen (TYLENOL) 325 MG tablet Take 2 tablets (650 mg total) by mouth every 6 (six) hours as needed for mild pain.    Marland Kitchen aspirin EC 325 MG EC tablet Take 1 tablet (325 mg total) by mouth daily. 30 tablet 0  . atorvastatin (LIPITOR) 40 MG tablet Take 1 tablet (40 mg total) by mouth daily. 30 tablet 3  . Calcium Carbonate-Vitamin D (CALCIUM + D) 600-200 MG-UNIT TABS Take 1 tablet by mouth 2 (two) times daily.     . carvedilol (COREG) 3.125 MG tablet TAKE ONE TABLET TWICE DAILY WITH MEALS 60 tablet 6  . COLCRYS 0.6 MG tablet TAKE ONE TABLET TWICE DAILY 60 tablet 6  . furosemide (LASIX) 20 MG tablet Take 1 tablet (20 mg total) by mouth daily. 30 tablet 5  . levothyroxine (SYNTHROID, LEVOTHROID) 25 MCG tablet TAKE 1 TABLET DAILY FOR THYROID 30 tablet 6  . Multiple Vitamin (MULTIVITAMIN WITH MINERALS) TABS tablet Take 1 tablet by mouth daily.    . Multiple Vitamins-Minerals (PRESERVISION AREDS PO) Take 2 tablets by mouth daily.     . pantoprazole (PROTONIX) 40 MG tablet Take 1 tablet (40 mg total) by mouth daily. 30 tablet 3  . potassium chloride (K-DUR) 10 MEQ tablet Take 1 tablet (10 mEq total) by mouth daily. CAPSULE 90 tablet 3  . triamcinolone cream (KENALOG) 0.1 % Apply 1 application topically 2 (two) times daily. 45 g 0  . [DISCONTINUED] citalopram (CELEXA) 20 MG tablet Take 20 mg by  mouth daily.       No current facility-administered medications on file prior to visit.     Past Medical History  Diagnosis Date  . Hyperlipemia   . CAD (coronary artery disease)     a. Lexiscan 10/13/14: mid anterior to apical & inf wall ischemia w/ WMA, mild to mod dep EF; b. cardiac cath 10/14/2014: ost LM to LM 50%, LM 99%, ost LAD 95%, ost LAD 95%, prox LAD to mid LAD 40%, prox RCA 30%, mid RCA 70%, Critical left main stenosis. Significant distal RCA stenosis. Heavily calcified arteries. Normal EF by echo with no significant AS or MR. Recommend urgent  CABG. cCABG x 4 (5/201  . GERD (gastroesophageal reflux disease)   . Macular degeneration   . Hyperlipidemia   . Hypertension   . Hypothyroidism   . Depression   . pernicious anemia   . Varicose veins   . CAD (coronary artery disease)     a. Lexi 10/13/14: mid ant to apical & inf wall ischemia w/ WMA, mild-mod dep EF; b. cath 10/14/14: ost LM to LM 50%, dLM 99%, ost LAD 95%, ost LAD 95%, pLAD-LAD 40%, pRCA 30%, mRCA 70%, Critical LM stenosis. Sig dRCA stenosis. Heavily calcified arteries; c. CABG x 4 (10/2014)  . Diastolic dysfunction     a. echo 10/2014: EF 55-60%, no RWMA, GR1DD, mild AI, trivial MR, mildly dilated LA, PASP normal     Past Surgical History  Procedure Laterality Date  . Cardiac catheterization Left 10/14/2014    Procedure: Left Heart Cath and Coronary Angiography;  Surgeon: Wellington Hampshire, MD;  Location: [facility];  Service: Cardiovascular;  Laterality: Left;  . Coronary artery bypass graft N/A 10/14/2014    Procedure: CORONARY ARTERY BYPASS GRAFTING (CABG), ON PUMP, TIMES FOUR, USING LEFT INTERNAL MAMMARY ARTERY, RIGHT GREATER SAPHENOUS VEIN HARVESTED ENDOSCOPICALLY;  Surgeon: Gaye Pollack, MD;  Location: [facility];  Service: Open Heart Surgery;  Laterality: N/A;  . Coronary artery bypass graft  10/2014    LIMA-->LAD, SVG-->RCA, sequential SVG-->Ramus and OM     Family History  Problem Relation Age of Onset  . Coronary artery disease Mother   . Coronary artery disease Brother   . Diabetes Mother   . Heart disease Father      History   Social History  . Marital Status: Widowed    Spouse Name: N/A  . Number of Children: N/A  . Years of Education: N/A   Occupational History  . Not on file.   Social History Main Topics  . Smoking status: Never Smoker   . Smokeless tobacco: Never Used  . Alcohol Use: No  . Drug Use: No  . Sexual Activity: No   Other Topics Concern  . Not on file   Social History Narrative   ** Merged History Encounter **          PHYSICAL EXAM   BP 134/78 mmHg  Pulse 80  Ht 5\' 4"  (1.626 m)  Wt 181 lb 8 oz (82.328 kg)  BMI 31.14 kg/m2 Constitutional: She is oriented to person, place, and time. She appears well-developed and well-nourished. No distress.  HENT: No nasal discharge.  Head: Normocephalic and atraumatic.  Eyes: Pupils are equal and round. No discharge.  Neck: Normal range of motion. Neck supple. No JVD present. No thyromegaly present.  Cardiovascular: Normal rate, regular rhythm, normal heart sounds. Exam reveals no gallop and no friction rub. No murmur heard. Surgical incision site is healing nicely.  Pulmonary/Chest: Effort normal and breath sounds normal. No stridor. No respiratory distress. She has no wheezes. She has no rales. She exhibits no tenderness.  Abdominal: Soft. Bowel sounds are normal. She exhibits no distension. There is no tenderness. There is no rebound and no guarding.  Musculoskeletal: Normal range of motion. She exhibits no edema and no tenderness.  Neurological: She is alert and oriented to person, place, and time. Coordination normal.  Skin: Skin is warm and dry. Generalized macular rash mostly on the upper body and arms. She is not diaphoretic. No erythema. No pallor.  Psychiatric: She has a normal mood and affect. Her behavior is normal. Judgment and thought content normal.       ASSESSMENT AND PLAN

## 2014-12-29 NOTE — Patient Instructions (Addendum)
Medication Instructions: Stop taking Furosemide ( Lasix ) , Potassium and Colchicine.   Labwork: None.   Procedures/Testing: None  Follow-Up: 3 months with Dr. Fletcher Anon  Any Additional Special Instructions Will Be Listed Below (If Applicable).   Call us to update about the rash in 1-2 weeks. Let us know if there is swelling or weight gain after stopping Lasix.

## 2014-12-29 NOTE — Assessment & Plan Note (Signed)
She is doing extremely well post CABG with no recurrent angina. Continue medical therapy. She has no evidence of fluid overload. Thus, I decided to discontinue furosemide and potassium supplement. She is to continue to monitor weight and for any signs of fluid overload.

## 2015-01-02 ENCOUNTER — Encounter: Payer: Self-pay | Admitting: *Deleted

## 2015-01-02 ENCOUNTER — Other Ambulatory Visit: Payer: Self-pay | Admitting: *Deleted

## 2015-01-02 NOTE — Patient Outreach (Signed)
[facility]) Care Management   01/02/2015  Jenna Marshall 03-Nov-1925 [id number]  Jenna Marshall is an 81 y.o. female  Subjective: "My heart doctor took me off the lasix and each day I see my weight go up I really worry and that is no way to live." "My PT will stop coming this week and I just want to keep progressing and not have a set back."  Objective:Blood pressure 130/78, pulse 76, resp. rate 18, height 1.626 m (5\' 4" ), weight 182 lb (82.555 kg), SpO2 98 %. On room air.   Review of Systems  Cardiovascular: Positive for leg swelling.  Musculoskeletal: Positive for back pain.  All other systems reviewed and are negative.   Physical Exam  Constitutional: She is oriented to person, place, and time. She appears well-developed and well-nourished.  Cardiovascular: Normal rate, regular rhythm and normal heart sounds.   Respiratory: Effort normal and breath sounds normal.  GI: Soft. Bowel sounds are normal.  Musculoskeletal: She exhibits tenderness.       Lumbar back: She exhibits tenderness.       Right lower leg: She exhibits swelling.       Left lower leg: She exhibits swelling.  Bilateral lower extremities with +1 edema  Neurological: She is alert and oriented to person, place, and time.  Skin: Skin is warm and dry.     Psychiatric: She has a normal mood and affect. Her speech is normal and behavior is normal. Judgment and thought content normal. Cognition and memory are normal.    Current Medications:   Current Outpatient Prescriptions  Medication Sig Dispense Refill  . acetaminophen (TYLENOL) 325 MG tablet Take 2 tablets (650 mg total) by mouth every 6 (six) hours as needed for mild pain.    Marland Kitchen aspirin EC 325 MG EC tablet Take 1 tablet (325 mg total) by mouth daily. 30 tablet 0  . atorvastatin (LIPITOR) 40 MG tablet Take 1 tablet (40 mg total) by mouth daily. 30 tablet 3  . Calcium Carbonate-Vitamin D (CALCIUM + D) 600-200 MG-UNIT TABS Take 1 tablet by mouth 2  (two) times daily.     . carvedilol (COREG) 3.125 MG tablet TAKE ONE TABLET TWICE DAILY WITH MEALS 60 tablet 6  . levothyroxine (SYNTHROID, LEVOTHROID) 25 MCG tablet TAKE 1 TABLET DAILY FOR THYROID 30 tablet 6  . Multiple Vitamin (MULTIVITAMIN WITH MINERALS) TABS tablet Take 1 tablet by mouth daily.    . Multiple Vitamins-Minerals (PRESERVISION AREDS PO) Take 2 tablets by mouth daily.     . pantoprazole (PROTONIX) 40 MG tablet Take 1 tablet (40 mg total) by mouth daily. 30 tablet 3  . triamcinolone cream (KENALOG) 0.1 % Apply 1 application topically 2 (two) times daily. 45 g 0  . [DISCONTINUED] citalopram (CELEXA) 20 MG tablet Take 20 mg by mouth daily.       No current facility-administered medications for this visit.    Functional Status:   In your present state of health, do you have any difficulty performing the following activities: 12/01/2014 11/01/2014  Hearing? N Y  Vision? N N  Difficulty concentrating or making decisions? N N  Walking or climbing stairs? Y Y  Dressing or bathing? Y Y  Doing errands, shopping? Tempie Donning  Preparing Food and eating ? Y N  Using the Toilet? N Y  In the past six months, have you accidently leaked urine? Y Y  Do you have problems with loss of bowel control? N N  Managing your  Medications? Y Y  Managing your Finances? Tempie Donning  Housekeeping or managing your Housekeeping? Tempie Donning    Fall/Depression Screening:    PHQ 2/9 Scores 12/01/2014 11/01/2014 09/29/2014  PHQ - 2 Score 0 0 0   Fall Risk  01/02/2015 12/01/2014 11/01/2014 09/29/2014  Falls in the past year? No No No No  Risk for fall due to : - Medication side effect - -   Assessment: Pt noted to be walking well with a cane. No SOB noted, in good spirits. Concerned that her weight was going up too much since MD took her off her lasix. Lasix pt stated had caused her to itch and scratch up her upper arms. RNCM reinforced importance of daily weights and letting MD know if there was an increase. RNCM went over HF zones  with pt and had her read back the s/s of worsening HF and when to call the MD. Pt needs more teaching on decreasing salt in her diet. RNCM discussed with pt fatigue vs normal tired to assist pt in differentiating the two. RNCM noted pt was gaining independence and was able to care for herself more at this visit.   Plan: RNCM will print and send pt EMMI on being salt smart, along with any other salt related materials. RNCM will see pt in one month with plans to d/c if she continues to be doing so well.   Rutherford Limerick RN, BSN  [facility] Care Management [phone])

## 2015-01-03 DIAGNOSIS — I10 Essential (primary) hypertension: Secondary | ICD-10-CM | POA: Diagnosis not present

## 2015-01-03 DIAGNOSIS — M6281 Muscle weakness (generalized): Secondary | ICD-10-CM | POA: Diagnosis not present

## 2015-01-03 DIAGNOSIS — Z48812 Encounter for surgical aftercare following surgery on the circulatory system: Secondary | ICD-10-CM | POA: Diagnosis not present

## 2015-01-03 DIAGNOSIS — I2510 Atherosclerotic heart disease of native coronary artery without angina pectoris: Secondary | ICD-10-CM | POA: Diagnosis not present

## 2015-01-03 DIAGNOSIS — Z951 Presence of aortocoronary bypass graft: Secondary | ICD-10-CM | POA: Diagnosis not present

## 2015-01-13 ENCOUNTER — Encounter: Payer: Self-pay | Admitting: Emergency Medicine

## 2015-01-13 ENCOUNTER — Emergency Department: Payer: Commercial Managed Care - HMO

## 2015-01-13 ENCOUNTER — Inpatient Hospital Stay
Admission: EM | Admit: 2015-01-13 | Discharge: 2015-01-19 | DRG: 469 | Disposition: A | Discharge status: Skilled Nursing Facility | Payer: Commercial Managed Care - HMO | Attending: Internal Medicine | Admitting: Internal Medicine

## 2015-01-13 DIAGNOSIS — Z8781 Personal history of (healed) traumatic fracture: Secondary | ICD-10-CM | POA: Diagnosis present

## 2015-01-13 DIAGNOSIS — Z96649 Presence of unspecified artificial hip joint: Secondary | ICD-10-CM

## 2015-01-13 DIAGNOSIS — Z833 Family history of diabetes mellitus: Secondary | ICD-10-CM

## 2015-01-13 DIAGNOSIS — M6281 Muscle weakness (generalized): Secondary | ICD-10-CM | POA: Diagnosis not present

## 2015-01-13 DIAGNOSIS — S72042A Displaced fracture of base of neck of left femur, initial encounter for closed fracture: Secondary | ICD-10-CM | POA: Diagnosis not present

## 2015-01-13 DIAGNOSIS — I5033 Acute on chronic diastolic (congestive) heart failure: Secondary | ICD-10-CM | POA: Diagnosis not present

## 2015-01-13 DIAGNOSIS — M25552 Pain in left hip: Secondary | ICD-10-CM | POA: Diagnosis not present

## 2015-01-13 DIAGNOSIS — Z951 Presence of aortocoronary bypass graft: Secondary | ICD-10-CM | POA: Diagnosis not present

## 2015-01-13 DIAGNOSIS — K219 Gastro-esophageal reflux disease without esophagitis: Secondary | ICD-10-CM | POA: Diagnosis present

## 2015-01-13 DIAGNOSIS — J9600 Acute respiratory failure, unspecified whether with hypoxia or hypercapnia: Secondary | ICD-10-CM | POA: Diagnosis not present

## 2015-01-13 DIAGNOSIS — M1990 Unspecified osteoarthritis, unspecified site: Secondary | ICD-10-CM | POA: Diagnosis present

## 2015-01-13 DIAGNOSIS — S72012A Unspecified intracapsular fracture of left femur, initial encounter for closed fracture: Secondary | ICD-10-CM | POA: Diagnosis not present

## 2015-01-13 DIAGNOSIS — I2510 Atherosclerotic heart disease of native coronary artery without angina pectoris: Secondary | ICD-10-CM | POA: Diagnosis not present

## 2015-01-13 DIAGNOSIS — S72002A Fracture of unspecified part of neck of left femur, initial encounter for closed fracture: Secondary | ICD-10-CM | POA: Diagnosis not present

## 2015-01-13 DIAGNOSIS — H3530 Unspecified macular degeneration: Secondary | ICD-10-CM | POA: Diagnosis present

## 2015-01-13 DIAGNOSIS — R05 Cough: Secondary | ICD-10-CM | POA: Diagnosis not present

## 2015-01-13 DIAGNOSIS — S299XXA Unspecified injury of thorax, initial encounter: Secondary | ICD-10-CM | POA: Diagnosis not present

## 2015-01-13 DIAGNOSIS — D649 Anemia, unspecified: Secondary | ICD-10-CM | POA: Diagnosis not present

## 2015-01-13 DIAGNOSIS — S79912A Unspecified injury of left hip, initial encounter: Secondary | ICD-10-CM | POA: Diagnosis not present

## 2015-01-13 DIAGNOSIS — J9811 Atelectasis: Secondary | ICD-10-CM | POA: Diagnosis not present

## 2015-01-13 DIAGNOSIS — I10 Essential (primary) hypertension: Secondary | ICD-10-CM | POA: Diagnosis present

## 2015-01-13 DIAGNOSIS — Y92009 Unspecified place in unspecified non-institutional (private) residence as the place of occurrence of the external cause: Secondary | ICD-10-CM

## 2015-01-13 DIAGNOSIS — E785 Hyperlipidemia, unspecified: Secondary | ICD-10-CM | POA: Diagnosis not present

## 2015-01-13 DIAGNOSIS — E039 Hypothyroidism, unspecified: Secondary | ICD-10-CM | POA: Diagnosis present

## 2015-01-13 DIAGNOSIS — Z8249 Family history of ischemic heart disease and other diseases of the circulatory system: Secondary | ICD-10-CM

## 2015-01-13 DIAGNOSIS — Z66 Do not resuscitate: Secondary | ICD-10-CM | POA: Diagnosis present

## 2015-01-13 DIAGNOSIS — I5189 Other ill-defined heart diseases: Secondary | ICD-10-CM

## 2015-01-13 DIAGNOSIS — R0902 Hypoxemia: Secondary | ICD-10-CM

## 2015-01-13 DIAGNOSIS — Z4789 Encounter for other orthopedic aftercare: Secondary | ICD-10-CM | POA: Diagnosis not present

## 2015-01-13 DIAGNOSIS — D62 Acute posthemorrhagic anemia: Secondary | ICD-10-CM | POA: Diagnosis not present

## 2015-01-13 DIAGNOSIS — W010XXA Fall on same level from slipping, tripping and stumbling without subsequent striking against object, initial encounter: Secondary | ICD-10-CM | POA: Diagnosis present

## 2015-01-13 DIAGNOSIS — Z01818 Encounter for other preprocedural examination: Secondary | ICD-10-CM | POA: Diagnosis not present

## 2015-01-13 DIAGNOSIS — M25559 Pain in unspecified hip: Secondary | ICD-10-CM | POA: Diagnosis not present

## 2015-01-13 DIAGNOSIS — W19XXXA Unspecified fall, initial encounter: Secondary | ICD-10-CM | POA: Diagnosis not present

## 2015-01-13 DIAGNOSIS — Z471 Aftercare following joint replacement surgery: Secondary | ICD-10-CM | POA: Diagnosis not present

## 2015-01-13 DIAGNOSIS — Z9181 History of falling: Secondary | ICD-10-CM

## 2015-01-13 LAB — URINALYSIS COMPLETE WITH MICROSCOPIC (ARMC ONLY)
Bilirubin Urine: NEGATIVE
Glucose, UA: NEGATIVE mg/dL
Hgb urine dipstick: NEGATIVE
Leukocytes, UA: NEGATIVE
NITRITE: POSITIVE — AB
PH: 6 (ref 5.0–8.0)
PROTEIN: NEGATIVE mg/dL
SPECIFIC GRAVITY, URINE: 1.016 (ref 1.005–1.030)

## 2015-01-13 LAB — CBC WITH DIFFERENTIAL/PLATELET
Basophils Absolute: 0.1 10*3/uL (ref 0–0.1)
Basophils Relative: 0 %
Eosinophils Absolute: 0.1 10*3/uL (ref 0–0.7)
Eosinophils Relative: 1 %
HEMATOCRIT: 33.9 % — AB (ref 35.0–47.0)
Hemoglobin: 11.2 g/dL — ABNORMAL LOW (ref 12.0–16.0)
LYMPHS ABS: 1 10*3/uL (ref 1.0–3.6)
LYMPHS PCT: 7 %
MCH: 31.4 pg (ref 26.0–34.0)
MCHC: 33.1 g/dL (ref 32.0–36.0)
MCV: 95 fL (ref 80.0–100.0)
Monocytes Absolute: 0.7 10*3/uL (ref 0.2–0.9)
Monocytes Relative: 5 %
NEUTROS ABS: 12.2 10*3/uL — AB (ref 1.4–6.5)
NEUTROS PCT: 87 %
Platelets: 131 10*3/uL — ABNORMAL LOW (ref 150–440)
RBC: 3.57 MIL/uL — AB (ref 3.80–5.20)
RDW: 15.5 % — ABNORMAL HIGH (ref 11.5–14.5)
WBC: 14.2 10*3/uL — ABNORMAL HIGH (ref 3.6–11.0)

## 2015-01-13 LAB — TYPE AND SCREEN
ABO/RH(D): B NEG
Antibody Screen: NEGATIVE

## 2015-01-13 LAB — BASIC METABOLIC PANEL
Anion gap: 9 (ref 5–15)
BUN: 25 mg/dL — ABNORMAL HIGH (ref 6–20)
CHLORIDE: 105 mmol/L (ref 101–111)
CO2: 23 mmol/L (ref 22–32)
Calcium: 8.8 mg/dL — ABNORMAL LOW (ref 8.9–10.3)
Creatinine, Ser: 0.93 mg/dL (ref 0.44–1.00)
GFR calc Af Amer: >60 mL/min (ref >60)
GFR calc non Af Amer: 54 mL/min — ABNORMAL LOW (ref >60)
Glucose, Bld: 106 mg/dL — ABNORMAL HIGH (ref 65–99)
Potassium: 3.9 mmol/L (ref 3.5–5.1)
SODIUM: 137 mmol/L (ref 135–145)

## 2015-01-13 LAB — ABO/RH: ABO/RH(D): B NEG

## 2015-01-13 MED ORDER — MORPHINE SULFATE 4 MG/ML IJ SOLN
INTRAMUSCULAR | Status: AC
  Administered 2015-01-13: 4 mg via INTRAVENOUS
  Filled 2015-01-13: qty 1

## 2015-01-13 MED ORDER — PANTOPRAZOLE SODIUM 40 MG PO TBEC
40.0000 mg | DELAYED_RELEASE_TABLET | Freq: Every day | ORAL | Status: DC
  Administered 2015-01-13: 40 mg via ORAL
  Filled 2015-01-13: qty 1

## 2015-01-13 MED ORDER — ACETAMINOPHEN 650 MG RE SUPP: 650.0000 mg | Freq: Four times a day (QID) | RECTAL | Status: DC | PRN

## 2015-01-13 MED ORDER — FLEET ENEMA 7-19 GM/118ML RE ENEM: 1.0000 | ENEMA | Freq: Once | RECTAL | Status: DC | PRN

## 2015-01-13 MED ORDER — CEFAZOLIN SODIUM-DEXTROSE 2-3 GM-% IV SOLR
2.0000 g | INTRAVENOUS | Status: AC
  Administered 2015-01-14: 2 g via INTRAVENOUS
  Filled 2015-01-13: qty 50

## 2015-01-13 MED ORDER — ONDANSETRON HCL 4 MG/2ML IJ SOLN
INTRAMUSCULAR | Status: AC
  Administered 2015-01-13: 4 mg via INTRAVENOUS
  Filled 2015-01-13: qty 2

## 2015-01-13 MED ORDER — OCUVITE-LUTEIN PO CAPS
2.0000 | ORAL_CAPSULE | Freq: Every day | ORAL | Status: DC
  Administered 2015-01-13 – 2015-01-19 (×5): 2 via ORAL
  Filled 2015-01-13 (×5): qty 2

## 2015-01-13 MED ORDER — ADULT MULTIVITAMIN W/MINERALS CH
1.0000 | ORAL_TABLET | Freq: Every day | ORAL | Status: DC
  Administered 2015-01-13 – 2015-01-19 (×6): 1 via ORAL
  Filled 2015-01-13 (×6): qty 1

## 2015-01-13 MED ORDER — OXYCODONE HCL 5 MG PO TABS
5.0000 mg | ORAL_TABLET | ORAL | Status: DC | PRN
  Administered 2015-01-13 – 2015-01-14 (×2): 5 mg via ORAL
  Filled 2015-01-13 (×2): qty 1

## 2015-01-13 MED ORDER — CARVEDILOL 3.125 MG PO TABS
3.1250 mg | ORAL_TABLET | Freq: Two times a day (BID) | ORAL | Status: DC
  Administered 2015-01-13 – 2015-01-19 (×11): 3.125 mg via ORAL
  Filled 2015-01-13 (×11): qty 1

## 2015-01-13 MED ORDER — ATORVASTATIN CALCIUM 20 MG PO TABS
40.0000 mg | ORAL_TABLET | Freq: Every day | ORAL | Status: DC
  Administered 2015-01-13 – 2015-01-19 (×6): 40 mg via ORAL
  Filled 2015-01-13 (×6): qty 2

## 2015-01-13 MED ORDER — ACETAMINOPHEN 325 MG PO TABS: 650.0000 mg | ORAL_TABLET | Freq: Four times a day (QID) | ORAL | Status: DC | PRN

## 2015-01-13 MED ORDER — MORPHINE SULFATE 4 MG/ML IJ SOLN
4.0000 mg | Freq: Once | INTRAMUSCULAR | Status: AC
  Administered 2015-01-13: 4 mg via INTRAVENOUS

## 2015-01-13 MED ORDER — SODIUM CHLORIDE 0.9 % IV SOLN
INTRAVENOUS | Status: DC
Start: 2015-01-13 — End: 2015-01-14
  Administered 2015-01-13: 22:00:00 via INTRAVENOUS

## 2015-01-13 MED ORDER — CALCIUM CARBONATE-VITAMIN D 500-200 MG-UNIT PO TABS
1.0000 | ORAL_TABLET | Freq: Two times a day (BID) | ORAL | Status: DC
  Administered 2015-01-13 – 2015-01-19 (×11): 1 via ORAL
  Filled 2015-01-13 (×23): qty 1

## 2015-01-13 MED ORDER — BISACODYL 10 MG RE SUPP: 10.0000 mg | Freq: Every day | RECTAL | Status: DC | PRN

## 2015-01-13 MED ORDER — MAGNESIUM HYDROXIDE 400 MG/5ML PO SUSP
30.0000 mL | Freq: Every day | ORAL | Status: DC | PRN
Start: 2015-01-13 — End: 2015-01-14

## 2015-01-13 MED ORDER — MORPHINE SULFATE 2 MG/ML IJ SOLN
2.0000 mg | INTRAMUSCULAR | Status: DC | PRN
Start: 2015-01-13 — End: 2015-01-14
  Administered 2015-01-13 – 2015-01-14 (×3): 2 mg via INTRAVENOUS
  Filled 2015-01-13: qty 2
  Filled 2015-01-13 (×2): qty 1

## 2015-01-13 MED ORDER — ONDANSETRON HCL 4 MG/2ML IJ SOLN
4.0000 mg | Freq: Once | INTRAMUSCULAR | Status: AC
  Administered 2015-01-13: 4 mg via INTRAVENOUS

## 2015-01-13 MED ORDER — FERROUS SULFATE 325 (65 FE) MG PO TABS: 325.0000 mg | ORAL_TABLET | Freq: Three times a day (TID) | ORAL | Status: DC

## 2015-01-13 MED ORDER — LEVOTHYROXINE SODIUM 25 MCG PO TABS
25.0000 ug | ORAL_TABLET | Freq: Every day | ORAL | Status: DC
  Administered 2015-01-15 – 2015-01-19 (×5): 25 ug via ORAL
  Filled 2015-01-13 (×5): qty 1

## 2015-01-13 NOTE — ED Notes (Signed)
Patient to ED after fall around 1:30 or 2pm, patient was in floor until she could be assisted up, reports tripping over chair in living room. Patient reports some relief of pain to that area but reports nausea at this time.

## 2015-01-13 NOTE — Consult Note (Signed)
ORTHOPAEDIC CONSULTATION  PATIENT NAME: Jenna Marshall DOB: 1925-08-02  MRN: [id number]  REQUESTING PHYSICIAN: Loletha Grayer, MD  Chief Complaint: Left hip pain  HPI: Jenna Marshall is a 81 y.o. female who complains of  left hip pain. She tripped and fell at home, landing on her left hip and side. She was unable stand or bear weight due to the left hip pain. She denied any loss of consciousness. She denied any other injury.  Past Medical History  Diagnosis Date  . Hyperlipemia   . CAD (coronary artery disease)     a. Lexiscan 10/13/14: mid anterior to apical & inf wall ischemia w/ WMA, mild to mod dep EF; b. cardiac cath 10/14/2014: ost LM to LM 50%, LM 99%, ost LAD 95%, ost LAD 95%, prox LAD to mid LAD 40%, prox RCA 30%, mid RCA 70%, Critical left main stenosis. Significant distal RCA stenosis. Heavily calcified arteries. Normal EF by echo with no significant AS or MR. Recommend urgent CABG. cCABG x 4 (5/201  . GERD (gastroesophageal reflux disease)   . Macular degeneration   . Hyperlipidemia   . Hypertension   . Hypothyroidism   . Depression   . pernicious anemia   . Varicose veins   . CAD (coronary artery disease)     a. Lexi 10/13/14: mid ant to apical & inf wall ischemia w/ WMA, mild-mod dep EF; b. cath 10/14/14: ost LM to LM 50%, dLM 99%, ost LAD 95%, ost LAD 95%, pLAD-LAD 40%, pRCA 30%, mRCA 70%, Critical LM stenosis. Sig dRCA stenosis. Heavily calcified arteries; c. CABG x 4 (10/2014)  . Diastolic dysfunction     a. echo 10/2014: EF 55-60%, no RWMA, GR1DD, mild AI, trivial MR, mildly dilated LA, PASP normal   Past Surgical History  Procedure Laterality Date  . Cardiac catheterization Left 10/14/2014    Procedure: Left Heart Cath and Coronary Angiography;  Surgeon: Wellington Hampshire, MD;  Location: [place] [facility];  Service: Cardiovascular;  Laterality: Left;  . Coronary artery bypass graft N/A 10/14/2014    Procedure: CORONARY ARTERY BYPASS GRAFTING (CABG), ON PUMP, TIMES  FOUR, USING LEFT INTERNAL MAMMARY ARTERY, RIGHT GREATER SAPHENOUS VEIN HARVESTED ENDOSCOPICALLY;  Surgeon: Gaye Pollack, MD;  Location: [place];  Service: Open Heart Surgery;  Laterality: N/A;  . Coronary artery bypass graft  10/2014    LIMA-->LAD, SVG-->RCA, sequential SVG-->Ramus and OM  . Varicose vein surgery    . Hemorrhoid surgery     Social History   Social History  . Marital Status: Widowed    Spouse Name: N/A  . Number of Children: N/A  . Years of Education: N/A   Social History Main Topics  . Smoking status: Never Smoker   . Smokeless tobacco: Never Used  . Alcohol Use: No  . Drug Use: No  . Sexual Activity: No   Other Topics Concern  . None   Social History Narrative   ** Merged History Encounter **       Family History  Problem Relation Age of Onset  . Coronary artery disease Mother   . Coronary artery disease Brother   . Diabetes Mother   . Heart disease Father    Allergies  Allergen Reactions  . Ambien [Zolpidem] Other (See Comments)    Reaction:  Hallucinations  . Codeine Nausea And Vomiting   Prior to Admission medications   Medication Sig Start Date End Date Taking? Authorizing Provider  acetaminophen (TYLENOL) 325 MG tablet Take 2 tablets (650  mg total) by mouth every 6 (six) hours as needed for mild pain. Patient taking differently: Take 325-650 mg by mouth every 6 (six) hours as needed for mild pain.  10/21/14  Yes Donielle Liston Alba, PA-C  aspirin EC 325 MG EC tablet Take 1 tablet (325 mg total) by mouth daily. 10/21/14  Yes Donielle Liston Alba, PA-C  atorvastatin (LIPITOR) 40 MG tablet Take 1 tablet (40 mg total) by mouth daily. 12/26/14  Yes Wellington Hampshire, MD  Calcium Carbonate-Vitamin D (CALCIUM + D) 600-200 MG-UNIT TABS Take 1 tablet by mouth 2 (two) times daily.    Yes Historical Provider, MD  carvedilol (COREG) 3.125 MG tablet Take 3.125 mg by mouth 2 (two) times daily.   Yes Historical Provider, MD  levothyroxine (SYNTHROID, LEVOTHROID)  25 MCG tablet Take 25 mcg by mouth daily before breakfast.   Yes Historical Provider, MD  Multiple Vitamin (MULTIVITAMIN WITH MINERALS) TABS tablet Take 1 tablet by mouth daily.   Yes Historical Provider, MD  Multiple Vitamins-Minerals (PRESERVISION AREDS 2) CAPS Take 2 capsules by mouth daily.   Yes Historical Provider, MD  pantoprazole (PROTONIX) 40 MG tablet Take 1 tablet (40 mg total) by mouth daily. 09/29/14  Yes [name], MD  triamcinolone cream (KENALOG) 0.1 % Apply 1 application topically 2 (two) times daily. Patient taking differently: Apply 1 application topically 2 (two) times daily as needed (for rash).  11/30/14  Yes Rubbie Battiest, NP   Positive ROS: All other systems have been reviewed and were otherwise negative with the exception of those mentioned in the HPI and as above.  Physical Exam: General: Alert and alert in no acute distress. HEENT: Atraumatic and normocephalic. Sclera are clear. Extraocular motion is intact. Oropharynx is clear with moist mucosa. Neck: Supple, nontender, good range of motion. No JVD or carotid bruits. Lungs: Clear to auscultation bilaterally. Cardiovascular: Regular rate and rhythm with normal S1 and S2. No murmurs. No gallops or rubs. Pedal pulses are palpable bilaterally. Homans test is negative bilaterally. No significant pretibial or ankle edema. Abdomen: Soft, nontender, and nondistended. Bowel sounds are present. Skin: No lesions in the area of chief complaint Neurologic: Awake, alert, and oriented. Sensory function is grossly intact. Motor strength is felt to be 5 over 5 bilaterally with the exception of the left lower extremity which was not assessed due to the injury. No clonus or tremor. Good motor coordination. Lymphatic: No axillary or cervical lymphadenopathy  MUSCULOSKELETAL: Pertinent examination involves the left lower extremity. The left lower extremity shortened and externally rotated. Pain is elicited with any attempt at range of  motion. No knee effusion. No tenderness to palpation about the knee.   X-rays: I reviewed AP pelvis AP and lateral radiographs of the left hip that were obtained at [facility] earlier today. There is a displaced left femoral neck fracture.  Assessment: Displaced left femoral neck fracture  Plan: The findings were discussed with the patient and her family. I have recommended left hip hemiarthroplasty. The usual perioperative course was discussed with the patient. The risks and benefits of surgical intervention were discussed. The patient expressed understanding of the risks and benefits and agreed with plans for surgical intervention.  Nadir Vasques P. Holley Bouche M.D.

## 2015-01-13 NOTE — ED Provider Notes (Signed)
[facility] Emergency Department Provider Note   ____________________________________________  Time seen: 3:45 PM I have reviewed the triage vital signs and the triage nursing note.  HISTORY  Chief Complaint Hip Pain   Historian Patient, daughter and son  HPI Jenna Marshall is a 81 y.o. female who recently had a cardiac bypass and has recovered from that, who has a history of osteoarthritis, who had a mechanical trip and fall and landed on her left hip that is now complaining of moderate to severe pain. No weakness or numbness. No head injury or neck injury. Mild tenderness to the posterior upper left back.    Past Medical History  Diagnosis Date  . Hyperlipemia   . CAD (coronary artery disease)     a. Lexiscan 10/13/14: mid anterior to apical & inf wall ischemia w/ WMA, mild to mod dep EF; b. cardiac cath 10/14/2014: ost LM to LM 50%, LM 99%, ost LAD 95%, ost LAD 95%, prox LAD to mid LAD 40%, prox RCA 30%, mid RCA 70%, Critical left main stenosis. Significant distal RCA stenosis. Heavily calcified arteries. Normal EF by echo with no significant AS or MR. Recommend urgent CABG. cCABG x 4 (5/201  . GERD (gastroesophageal reflux disease)   . Macular degeneration   . Hyperlipidemia   . Hypertension   . Hypothyroidism   . Depression   . pernicious anemia   . Varicose veins   . CAD (coronary artery disease)     a. Lexi 10/13/14: mid ant to apical & inf wall ischemia w/ WMA, mild-mod dep EF; b. cath 10/14/14: ost LM to LM 50%, dLM 99%, ost LAD 95%, ost LAD 95%, pLAD-LAD 40%, pRCA 30%, mRCA 70%, Critical LM stenosis. Sig dRCA stenosis. Heavily calcified arteries; c. CABG x 4 (10/2014)  . Diastolic dysfunction     a. echo 10/2014: EF 55-60%, no RWMA, GR1DD, mild AI, trivial MR, mildly dilated LA, PASP normal    Patient Active Problem List   Diagnosis Date Noted  . Drug rash 12/29/2014  . Coronary artery disease involving native coronary artery without angina  pectoris 11/14/2014  . Adjustment disorder 10/24/2014  . Hypoxia   . UTI (lower urinary tract infection) 10/22/2014  . Unstable angina 10/14/2014  . S/P CABG x 4 10/14/2014  . CAD (coronary artery disease)   . Diastolic dysfunction   . Chest pain, central 10/11/2014  . Essential hypertension 10/11/2014  . Hyperlipidemia 10/11/2014  . Gastroesophageal reflux disease without esophagitis 10/11/2014  . Hypothyroidism 10/11/2014  . Other chest pain 10/02/2014  . Intertriginous candidiasis 10/02/2014  . Medicare annual wellness visit, subsequent 04/01/2014  . Urinary incontinence, urge 09/25/2013  . Osteopenia of the elderly 05/13/2013  . Knee pain, bilateral 04/05/2013  . Long-term use of high-risk medication 03/12/2013  . History of shingles 09/02/2012  . History of DVT of lower extremity 09/02/2012  . Obesity (BMI 30-39.9) 09/01/2011  . Macular degeneration   . Hyperlipidemia   . Hypothyroidism     Past Surgical History  Procedure Laterality Date  . Cardiac catheterization Left 10/14/2014    Procedure: Left Heart Cath and Coronary Angiography;  Surgeon: Wellington Hampshire, MD;  Location: [place] [facility];  Service: Cardiovascular;  Laterality: Left;  . Coronary artery bypass graft N/A 10/14/2014    Procedure: CORONARY ARTERY BYPASS GRAFTING (CABG), ON PUMP, TIMES FOUR, USING LEFT INTERNAL MAMMARY ARTERY, RIGHT GREATER SAPHENOUS VEIN HARVESTED ENDOSCOPICALLY;  Surgeon: Gaye Pollack, MD;  Location: [place];  Service: Open  Heart Surgery;  Laterality: N/A;  . Coronary artery bypass graft  10/2014    LIMA-->LAD, SVG-->RCA, sequential SVG-->Ramus and OM    Current Outpatient Rx  Name  Route  Sig  Dispense  Refill  . acetaminophen (TYLENOL) 325 MG tablet   Oral   Take 2 tablets (650 mg total) by mouth every 6 (six) hours as needed for mild pain.         Marland Kitchen aspirin EC 325 MG EC tablet   Oral   Take 1 tablet (325 mg total) by mouth daily.   30 tablet   0   . atorvastatin  (LIPITOR) 40 MG tablet   Oral   Take 1 tablet (40 mg total) by mouth daily.   30 tablet   3   . Calcium Carbonate-Vitamin D (CALCIUM + D) 600-200 MG-UNIT TABS   Oral   Take 1 tablet by mouth 2 (two) times daily.          . carvedilol (COREG) 3.125 MG tablet      TAKE ONE TABLET TWICE DAILY WITH MEALS   60 tablet   6   . levothyroxine (SYNTHROID, LEVOTHROID) 25 MCG tablet      TAKE 1 TABLET DAILY FOR THYROID   30 tablet   6   . Multiple Vitamin (MULTIVITAMIN WITH MINERALS) TABS tablet   Oral   Take 1 tablet by mouth daily.         . Multiple Vitamins-Minerals (PRESERVISION AREDS PO)   Oral   Take 2 tablets by mouth daily.          . pantoprazole (PROTONIX) 40 MG tablet   Oral   Take 1 tablet (40 mg total) by mouth daily.   30 tablet   3   . triamcinolone cream (KENALOG) 0.1 %   Topical   Apply 1 application topically 2 (two) times daily.   45 g   0     Allergies Ambien; Codeine; Codeine; and Codeine sulfate  Family History  Problem Relation Age of Onset  . Coronary artery disease Mother   . Coronary artery disease Brother   . Diabetes Mother   . Heart disease Father     Social History Social History  Substance Use Topics  . Smoking status: Never Smoker   . Smokeless tobacco: Never Used  . Alcohol Use: No    Review of Systems  Constitutional: Negative for fever. Eyes: Negative for visual changes. ENT: Negative for sore throat. Cardiovascular: Negative for chest pain. Respiratory: Negative for shortness of breath. Gastrointestinal: Negative for abdominal pain, vomiting and diarrhea. Genitourinary: Negative for dysuria. Musculoskeletal: Negative for back pain. Skin: Negative for rash. Neurological: Negative for headaches, focal weakness or numbness. 10 point Review of Systems otherwise negative ____________________________________________   PHYSICAL EXAM:  VITAL SIGNS: ED Triage Vitals  Enc Vitals Group     BP 01/13/15 1524 170/59  mmHg     Pulse Rate 01/13/15 1524 77     Resp 01/13/15 1524 13     Temp 01/13/15 1524 98.1 F (36.7 C)     Temp Source 01/13/15 1524 Oral     SpO2 01/13/15 1524 92 %     Weight 01/13/15 1524 179 lb (81.194 kg)     Height 01/13/15 1524 5\' 4"  (1.626 m)     Head Cir --      Peak Flow --      Pain Score 01/13/15 1525 1     Pain Loc --  Pain Edu? --      Excl. in [place]? --      Constitutional: Alert and oriented. Well appearing and in no distress. Eyes: Conjunctivae are normal. PERRL. Normal extraocular movements. ENT   Head: Normocephalic and atraumatic.   Nose: No congestion/rhinnorhea.   Mouth/Throat: Mucous membranes are moist.   Neck: No stridor. Cardiovascular/Chest: Normal rate, regular rhythm.  No murmurs, rubs, or gallops. Respiratory: Normal respiratory effort without tachypnea nor retractions. Breath sounds are clear and equal bilaterally. No wheezes/rales/rhonchi. Gastrointestinal: Soft. No distention, no guarding, no rebound. Nontender   Genitourinary/rectal:Deferred Musculoskeletal: minimal lower extremity edema bilaterally lower extremities. Pelvis stable. Some tenderness at the lateral left hip with palpation and range of motion. Left leg shortened and mildly externally rotated. Neurologic:  Normal speech and language. No gross or focal neurologic deficits are appreciated. Skin:  Skin is warm, dry and intact. No rash noted. Psychiatric: Mood and affect are normal. Speech and behavior are normal. Patient exhibits appropriate insight and judgment.  ____________________________________________   EKG I, Lisa Roca, MD, the attending physician have personally viewed and interpreted all ECGs.  73 beats rhythm. Normal sinus rhythm. Narrow QRS. Normal axis. Normal ST and T-wave. ____________________________________________  LABS (pertinent positives/negatives)  Basic metabolic panel without significant abnormality CBC shows white blood cell count is  14.2, hemoglobin 11.2, platelets 131  ____________________________________________  RADIOLOGY All Xrays were viewed by me. Imaging interpreted by Radiologist.  Left hip with pelvis: Left femoral neck fracture without comminution, mildly displaced and angulated  Chest x-ray 1 view: Negative __________________________________________  PROCEDURES  Procedure(s) performed: None Critical Care performed: None  ____________________________________________   ED COURSE / ASSESSMENT AND PLAN  CONSULTATIONS: phone consultation with orthopedist Dr. Marry Guan. Face-to-face with hospitalist for admission.  Pertinent labs & imaging results that were available during my care of the patient were reviewed by me and considered in my medical decision making (see chart for details).   No other trauma suspected other than the left hip clinically. C-spine cleared clinically. X-ray confirms left hip fracture. Patient will be admitted to medicine with orthopedic consultation.  Patient / Family / Caregiver informed of clinical course, medical decision-making process, and agree with plan.    ___________________________________________   FINAL CLINICAL IMPRESSION(S) / ED DIAGNOSES   Final diagnoses:  Closed left hip fracture, initial encounter       Lisa Roca, MD 01/13/15 1656

## 2015-01-13 NOTE — H&P (Signed)
[facility] NAME: Jenna Marshall    MR#:  [id number]  DATE OF BIRTH:  1926-02-01  DATE OF ADMISSION:  01/13/2015  PRIMARY CARE PHYSICIAN: [name], MD   REQUESTING/REFERRING PHYSICIAN: Lisa Roca, M.D.  CHIEF COMPLAINT:   Chief Complaint  Patient presents with  . Hip Pain    HISTORY OF PRESENT ILLNESS:  Jenna Marshall  is a 81 y.o. female with a known history of coronary artery disease with CABG 3 months ago. She presents after hitting her toe on a chair and a fall. She was lying on the floor for about an hour. Someone with her called her family to come over. They tried to get her up and she could not weight-bear on it. Pain in the groin on the left is more severe with any sort of movement. She felt nauseous with some pain medication. In the ER, she was found to have a left femoral neck fracture. And hospitalist services were contacted for further evaluation.  PAST MEDICAL HISTORY:   Past Medical History  Diagnosis Date  . Hyperlipemia   . CAD (coronary artery disease)     a. Lexiscan 10/13/14: mid anterior to apical & inf wall ischemia w/ WMA, mild to mod dep EF; b. cardiac cath 10/14/2014: ost LM to LM 50%, LM 99%, ost LAD 95%, ost LAD 95%, prox LAD to mid LAD 40%, prox RCA 30%, mid RCA 70%, Critical left main stenosis. Significant distal RCA stenosis. Heavily calcified arteries. Normal EF by echo with no significant AS or MR. Recommend urgent CABG. cCABG x 4 (5/201  . GERD (gastroesophageal reflux disease)   . Macular degeneration   . Hyperlipidemia   . Hypertension   . Hypothyroidism   . Depression   . pernicious anemia   . Varicose veins   . CAD (coronary artery disease)     a. Lexi 10/13/14: mid ant to apical & inf wall ischemia w/ WMA, mild-mod dep EF; b. cath 10/14/14: ost LM to LM 50%, dLM 99%, ost LAD 95%, ost LAD 95%, pLAD-LAD 40%, pRCA 30%, mRCA 70%, Critical LM stenosis. Sig dRCA stenosis. Heavily calcified  arteries; c. CABG x 4 (10/2014)  . Diastolic dysfunction     a. echo 10/2014: EF 55-60%, no RWMA, GR1DD, mild AI, trivial MR, mildly dilated LA, PASP normal    PAST SURGICAL HISTORY:   Past Surgical History  Procedure Laterality Date  . Cardiac catheterization Left 10/14/2014    Procedure: Left Heart Cath and Coronary Angiography;  Surgeon: Wellington Hampshire, MD;  Location: [place] [facility];  Service: Cardiovascular;  Laterality: Left;  . Coronary artery bypass graft N/A 10/14/2014    Procedure: CORONARY ARTERY BYPASS GRAFTING (CABG), ON PUMP, TIMES FOUR, USING LEFT INTERNAL MAMMARY ARTERY, RIGHT GREATER SAPHENOUS VEIN HARVESTED ENDOSCOPICALLY;  Surgeon: Gaye Pollack, MD;  Location: [facility];  Service: Open Heart Surgery;  Laterality: N/A;  . Coronary artery bypass graft  10/2014    LIMA-->LAD, SVG-->RCA, sequential SVG-->Ramus and OM  . Varicose vein surgery    . Hemorrhoid surgery      SOCIAL HISTORY:   Social History  Substance Use Topics  . Smoking status: Never Smoker   . Smokeless tobacco: Never Used  . Alcohol Use: No    FAMILY HISTORY:   Family History  Problem Relation Age of Onset  . Coronary artery disease Mother   . Coronary artery disease Brother   . Diabetes Mother   .  Heart disease Father     DRUG ALLERGIES:   Allergies  Allergen Reactions  . Ambien [Zolpidem] Other (See Comments)    Reaction:  Hallucinations  . Codeine Nausea And Vomiting    REVIEW OF SYSTEMS:  CONSTITUTIONAL: No fever, fatigue or weakness.  EYES: No blurred or double vision. Wears glasses. EARS, NOSE, AND THROAT: No tinnitus or ear pain. No sore throat. Has a hearing aid. RESPIRATORY: No cough, shortness of breath, wheezing or hemoptysis.  CARDIOVASCULAR: No chest pain, orthopnea, edema.  GASTROINTESTINAL: Positive for nausea. No vomiting, diarrhea or abdominal pain. No blood in bowel movements. GENITOURINARY: No dysuria, hematuria.  ENDOCRINE: No polyuria, nocturia,  HEMATOLOGY:  No anemia, easy bruising or bleeding SKIN: No rash or lesion. MUSCULOSKELETAL: Positive for left groin pain. Arthritis in the hands and knees.  NEUROLOGIC: No tingling, numbness, weakness.  PSYCHIATRY: No anxiety or depression.   MEDICATIONS AT HOME:   Prior to Admission medications   Medication Sig Start Date End Date Taking? Authorizing Provider  acetaminophen (TYLENOL) 325 MG tablet Take 2 tablets (650 mg total) by mouth every 6 (six) hours as needed for mild pain. Patient taking differently: Take 325-650 mg by mouth every 6 (six) hours as needed for mild pain.  10/21/14  Yes Donielle Liston Alba, PA-C  aspirin EC 325 MG EC tablet Take 1 tablet (325 mg total) by mouth daily. 10/21/14  Yes Donielle Liston Alba, PA-C  atorvastatin (LIPITOR) 40 MG tablet Take 1 tablet (40 mg total) by mouth daily. 12/26/14  Yes Wellington Hampshire, MD  Calcium Carbonate-Vitamin D (CALCIUM + D) 600-200 MG-UNIT TABS Take 1 tablet by mouth 2 (two) times daily.    Yes Historical Provider, MD  carvedilol (COREG) 3.125 MG tablet Take 3.125 mg by mouth 2 (two) times daily.   Yes Historical Provider, MD  levothyroxine (SYNTHROID, LEVOTHROID) 25 MCG tablet Take 25 mcg by mouth daily before breakfast.   Yes Historical Provider, MD  Multiple Vitamin (MULTIVITAMIN WITH MINERALS) TABS tablet Take 1 tablet by mouth daily.   Yes Historical Provider, MD  Multiple Vitamins-Minerals (PRESERVISION AREDS 2) CAPS Take 2 capsules by mouth daily.   Yes Historical Provider, MD  pantoprazole (PROTONIX) 40 MG tablet Take 1 tablet (40 mg total) by mouth daily. 09/29/14  Yes [name], MD  triamcinolone cream (KENALOG) 0.1 % Apply 1 application topically 2 (two) times daily. Patient taking differently: Apply 1 application topically 2 (two) times daily as needed (for rash).  11/30/14  Yes Rubbie Battiest, NP      VITAL SIGNS:  Blood pressure 170/68, pulse 77, temperature 98.1 F (36.7 C), temperature source Oral, resp. rate 13, height 5'  4" (1.626 m), weight 81.194 kg (179 lb), SpO2 92 %.  PHYSICAL EXAMINATION:  GENERAL:  81 y.o.-year-old patient lying in the bed with no acute distress.  EYES: Pupils equal, round, reactive to light and accommodation. No scleral icterus. Extraocular muscles intact.  HEENT: Head atraumatic, normocephalic. Oropharynx and nasopharynx clear.  NECK:  Supple, no jugular venous distention. No thyroid enlargement, no tenderness.  LUNGS: Normal breath sounds bilaterally, no wheezing, rales,rhonchi or crepitation. No use of accessory muscles of respiration.  CARDIOVASCULAR: S1, S2 normal. No murmurs, rubs, or gallops.  ABDOMEN: Soft, nontender, nondistended. Bowel sounds present. No organomegaly or mass.  EXTREMITIES: Trace edema, no cyanosis, or clubbing.  NEUROLOGIC: Cranial nerves II through XII are intact. Gait not checked. Sensation in bilateral lower extremities intact. PSYCHIATRIC: The patient is alert and oriented x 3.  SKIN: No rash, lesion, or ulcer.   LABORATORY PANEL:   CBC  Recent Labs Lab 01/13/15 1619  WBC 14.2*  HGB 11.2*  HCT 33.9*  PLT 131*    Chemistries   Recent Labs Lab 01/13/15 1619  NA 137  K 3.9  CL 105  CO2 23  GLUCOSE 106*  BUN 25*  CREATININE 0.93  CALCIUM 8.8*    RADIOLOGY:  Dg Chest 1 View  01/13/2015   CLINICAL DATA:  [place] today.  Left hip pain.  EXAM: CHEST  1 VIEW  COMPARISON:  11/23/2014  FINDINGS: The cardiac silhouette, mediastinal and hilar contours are stable. Mild cardiac enlargement. Stable surgical changes from bypass surgery. The lungs are grossly clear. The bony thorax is intact.  IMPRESSION: Cardiac enlargement but no definite acute pulmonary findings.   Electronically Signed   By: Marijo Sanes M.D.   On: 01/13/2015 16:20   Dg Hip Unilat With Pelvis 2-3 Views Left  01/13/2015   CLINICAL DATA:  Patient fell today at home landing on her left hip and now has left hip pain, decreased ROM, and is unable to stand or walk. Patient has also  recently had heart surgery.  EXAM: DG HIP (WITH OR WITHOUT PELVIS) 2-3V LEFT  COMPARISON:  None.  FINDINGS: There is a fracture of the mid cervical back, with no significant comminution, but with superior displacement of the distal fracture component approximate 1.5 cm, as well as mild varus angulation and mild apex anterior angulation.  No other fractures. Hip joints are normally aligned. Bones are diffusely demineralized.  IMPRESSION: 1. Left femoral neck fracture, without significant comminution, mildly displaced and angulated.   Electronically Signed   By: Lajean Manes M.D.   On: 01/13/2015 16:20    EKG:   Normal sinus rhythm 73 bpm  IMPRESSION AND PLAN:   1. Preoperative evaluation for left femoral neck fracture. The patient had a CABG a few months ago. No further cardiac testing prior to surgery. May proceed with surgery. Patient is moderate cardiovascular risk. Patient is willing to undergo the risk of surgery and anesthesia in order to walk again as soon as possible and to avoid complications of not walking for 6 months (such as DVT, pneumonia, decubitus ulcer). 2. History of coronary artery disease- continue low-dose Coreg. Holding aspirin preoperatively. 3. Hyperlipidemia unspecified continue atorvastatin. 4. Hypothyroidism unspecified continue levothyroxine. 5. Macular degeneration continue PreserVision. 6. Gastroesophageal reflux disease without esophagitis continue Protonix.   All the records are reviewed and case discussed with ED provider. Management plans discussed with the patient, family and they are in agreement.  CODE STATUS: DO NOT RESUSCITATE  TOTAL TIME TAKING CARE OF THIS PATIENT: 50 minutes.    Loletha Grayer M.D on 01/13/2015 at 5:42 PM  Between 7am to 6pm - Pager - [phone]  After 6pm call admission pager [facility]  Office  [phone]  CC: Primary care physician; [name], MD

## 2015-01-14 ENCOUNTER — Encounter
Admission: EM | Disposition: A | Discharge status: Skilled Nursing Facility | Payer: Self-pay | Source: Home / Self Care | Attending: Internal Medicine

## 2015-01-14 ENCOUNTER — Inpatient Hospital Stay: Payer: Commercial Managed Care - HMO | Admitting: Anesthesiology

## 2015-01-14 ENCOUNTER — Encounter: Payer: Self-pay | Admitting: Anesthesiology

## 2015-01-14 ENCOUNTER — Inpatient Hospital Stay: Payer: Commercial Managed Care - HMO

## 2015-01-14 HISTORY — PX: HIP ARTHROPLASTY: SHX981

## 2015-01-14 LAB — CBC
HEMATOCRIT: 32.3 % — AB (ref 35.0–47.0)
Hemoglobin: 10.9 g/dL — ABNORMAL LOW (ref 12.0–16.0)
MCH: 32.5 pg (ref 26.0–34.0)
MCHC: 33.8 g/dL (ref 32.0–36.0)
MCV: 96.1 fL (ref 80.0–100.0)
Platelets: 119 10*3/uL — ABNORMAL LOW (ref 150–440)
RBC: 3.36 MIL/uL — AB (ref 3.80–5.20)
RDW: 15.3 % — ABNORMAL HIGH (ref 11.5–14.5)
WBC: 11.6 10*3/uL — AB (ref 3.6–11.0)

## 2015-01-14 LAB — BASIC METABOLIC PANEL
ANION GAP: 5 (ref 5–15)
BUN: 21 mg/dL — ABNORMAL HIGH (ref 6–20)
CO2: 28 mmol/L (ref 22–32)
Calcium: 8.5 mg/dL — ABNORMAL LOW (ref 8.9–10.3)
Chloride: 106 mmol/L (ref 101–111)
Creatinine, Ser: 0.87 mg/dL (ref 0.44–1.00)
GFR calc Af Amer: >60 mL/min (ref >60)
GFR calc non Af Amer: 58 mL/min — ABNORMAL LOW (ref >60)
Glucose, Bld: 120 mg/dL — ABNORMAL HIGH (ref 65–99)
Potassium: 4.4 mmol/L (ref 3.5–5.1)
Sodium: 139 mmol/L (ref 135–145)

## 2015-01-14 LAB — MRSA PCR SCREENING: MRSA BY PCR: POSITIVE — AB

## 2015-01-14 SURGERY — HEMIARTHROPLASTY, HIP, DIRECT ANTERIOR APPROACH, FOR FRACTURE
Anesthesia: General | Laterality: Left

## 2015-01-14 MED ORDER — FERROUS SULFATE 325 (65 FE) MG PO TABS
325.0000 mg | ORAL_TABLET | Freq: Two times a day (BID) | ORAL | Status: DC
  Administered 2015-01-15 – 2015-01-19 (×9): 325 mg via ORAL
  Filled 2015-01-14 (×9): qty 1

## 2015-01-14 MED ORDER — MORPHINE SULFATE 2 MG/ML IJ SOLN
2.0000 mg | INTRAMUSCULAR | Status: DC | PRN
  Administered 2015-01-14: 2 mg via INTRAVENOUS
  Filled 2015-01-14: qty 1

## 2015-01-14 MED ORDER — VANCOMYCIN HCL 1000 MG IV SOLR
INTRAVENOUS | Status: AC
  Filled 2015-01-14: qty 1000

## 2015-01-14 MED ORDER — SODIUM CHLORIDE 0.9 % IV SOLN
INTRAVENOUS | Status: DC
  Administered 2015-01-14 – 2015-01-15 (×2): via INTRAVENOUS

## 2015-01-14 MED ORDER — SUCCINYLCHOLINE CHLORIDE 20 MG/ML IJ SOLN
INTRAMUSCULAR | Status: DC | PRN
  Administered 2015-01-14: 100 mg via INTRAVENOUS

## 2015-01-14 MED ORDER — ENOXAPARIN SODIUM 40 MG/0.4ML ~~LOC~~ SOLN
40.0000 mg | SUBCUTANEOUS | Status: DC
  Administered 2015-01-15 – 2015-01-19 (×5): 40 mg via SUBCUTANEOUS
  Filled 2015-01-14 (×5): qty 0.4

## 2015-01-14 MED ORDER — LIDOCAINE HCL (CARDIAC) 20 MG/ML IV SOLN
INTRAVENOUS | Status: DC | PRN
  Administered 2015-01-14: 100 mg via INTRAVENOUS

## 2015-01-14 MED ORDER — FENTANYL CITRATE (PF) 100 MCG/2ML IJ SOLN
INTRAMUSCULAR | Status: AC
  Filled 2015-01-14: qty 2

## 2015-01-14 MED ORDER — MUPIROCIN 2 % EX OINT
1.0000 "application " | TOPICAL_OINTMENT | Freq: Two times a day (BID) | CUTANEOUS | Status: AC
  Administered 2015-01-14 – 2015-01-18 (×8): 1 via NASAL
  Filled 2015-01-14 (×2): qty 22

## 2015-01-14 MED ORDER — SUGAMMADEX SODIUM 200 MG/2ML IV SOLN
INTRAVENOUS | Status: DC | PRN
  Administered 2015-01-14: 300 mg via INTRAVENOUS

## 2015-01-14 MED ORDER — PANTOPRAZOLE SODIUM 40 MG PO TBEC
40.0000 mg | DELAYED_RELEASE_TABLET | Freq: Two times a day (BID) | ORAL | Status: DC
  Administered 2015-01-14 – 2015-01-19 (×10): 40 mg via ORAL
  Filled 2015-01-14 (×10): qty 1

## 2015-01-14 MED ORDER — ONDANSETRON HCL 4 MG PO TABS
4.0000 mg | ORAL_TABLET | Freq: Four times a day (QID) | ORAL | Status: DC | PRN
  Administered 2015-01-16: 4 mg via ORAL
  Filled 2015-01-14: qty 1

## 2015-01-14 MED ORDER — METOCLOPRAMIDE HCL 10 MG PO TABS
10.0000 mg | ORAL_TABLET | Freq: Three times a day (TID) | ORAL | Status: AC
  Administered 2015-01-14 – 2015-01-16 (×7): 10 mg via ORAL
  Filled 2015-01-14 (×6): qty 1

## 2015-01-14 MED ORDER — ROCURONIUM BROMIDE 100 MG/10ML IV SOLN
INTRAVENOUS | Status: DC | PRN
  Administered 2015-01-14 (×2): 30 mg via INTRAVENOUS
  Administered 2015-01-14: 20 mg via INTRAVENOUS

## 2015-01-14 MED ORDER — LACTATED RINGERS IV SOLN
INTRAVENOUS | Status: DC | PRN
  Administered 2015-01-14 (×2): via INTRAVENOUS

## 2015-01-14 MED ORDER — ONDANSETRON HCL 4 MG/2ML IJ SOLN
4.0000 mg | Freq: Four times a day (QID) | INTRAMUSCULAR | Status: DC | PRN
  Administered 2015-01-17 – 2015-01-19 (×2): 4 mg via INTRAVENOUS
  Filled 2015-01-14: qty 2

## 2015-01-14 MED ORDER — ACETAMINOPHEN 10 MG/ML IV SOLN
1000.0000 mg | Freq: Four times a day (QID) | INTRAVENOUS | Status: AC
  Administered 2015-01-14 – 2015-01-15 (×3): 1000 mg via INTRAVENOUS
  Filled 2015-01-14 (×4): qty 100

## 2015-01-14 MED ORDER — NEOMYCIN-POLYMYXIN B GU 40-200000 IR SOLN
Status: AC
  Filled 2015-01-14: qty 20

## 2015-01-14 MED ORDER — CETYLPYRIDINIUM CHLORIDE 0.05 % MT LIQD
7.0000 mL | Freq: Two times a day (BID) | OROMUCOSAL | Status: DC
  Administered 2015-01-14 – 2015-01-19 (×9): 7 mL via OROMUCOSAL

## 2015-01-14 MED ORDER — ONDANSETRON HCL 4 MG/2ML IJ SOLN
INTRAMUSCULAR | Status: DC | PRN
  Administered 2015-01-14: 4 mg via INTRAVENOUS

## 2015-01-14 MED ORDER — ACETAMINOPHEN 10 MG/ML IV SOLN
INTRAVENOUS | Status: AC
  Filled 2015-01-14: qty 100

## 2015-01-14 MED ORDER — PHENYLEPHRINE HCL 10 MG/ML IJ SOLN
INTRAMUSCULAR | Status: AC
  Filled 2015-01-14: qty 1

## 2015-01-14 MED ORDER — FENTANYL CITRATE (PF) 100 MCG/2ML IJ SOLN
INTRAMUSCULAR | Status: DC | PRN
  Administered 2015-01-14 (×3): 50 ug via INTRAVENOUS

## 2015-01-14 MED ORDER — MAGNESIUM HYDROXIDE 400 MG/5ML PO SUSP
30.0000 mL | Freq: Every day | ORAL | Status: DC | PRN
  Administered 2015-01-17 – 2015-01-18 (×2): 30 mL via ORAL
  Filled 2015-01-14 (×2): qty 30

## 2015-01-14 MED ORDER — METOCLOPRAMIDE HCL 5 MG PO TABS
5.0000 mg | ORAL_TABLET | Freq: Three times a day (TID) | ORAL | Status: DC | PRN
  Filled 2015-01-14 (×2): qty 1

## 2015-01-14 MED ORDER — FLEET ENEMA 7-19 GM/118ML RE ENEM: 1.0000 | ENEMA | Freq: Once | RECTAL | Status: DC | PRN

## 2015-01-14 MED ORDER — DEXAMETHASONE SODIUM PHOSPHATE 10 MG/ML IJ SOLN
INTRAMUSCULAR | Status: DC | PRN
  Administered 2015-01-14: 5 mg via INTRAVENOUS

## 2015-01-14 MED ORDER — SENNOSIDES-DOCUSATE SODIUM 8.6-50 MG PO TABS
1.0000 | ORAL_TABLET | Freq: Two times a day (BID) | ORAL | Status: DC
  Administered 2015-01-14 – 2015-01-19 (×10): 1 via ORAL
  Filled 2015-01-14 (×10): qty 1

## 2015-01-14 MED ORDER — ACETAMINOPHEN 10 MG/ML IV SOLN
INTRAVENOUS | Status: DC | PRN
  Administered 2015-01-14: 1000 mg via INTRAVENOUS

## 2015-01-14 MED ORDER — MENTHOL 3 MG MT LOZG
1.0000 | LOZENGE | OROMUCOSAL | Status: DC | PRN
  Filled 2015-01-14: qty 9

## 2015-01-14 MED ORDER — NEOMYCIN-POLYMYXIN B GU 40-200000 IR SOLN
Status: DC | PRN
  Administered 2015-01-14: 16 mL

## 2015-01-14 MED ORDER — ONDANSETRON HCL 4 MG/2ML IJ SOLN: 4.0000 mg | Freq: Once | INTRAMUSCULAR | Status: DC | PRN

## 2015-01-14 MED ORDER — ACETAMINOPHEN 650 MG RE SUPP: 650.0000 mg | Freq: Four times a day (QID) | RECTAL | Status: DC | PRN

## 2015-01-14 MED ORDER — ACETAMINOPHEN 325 MG PO TABS
650.0000 mg | ORAL_TABLET | Freq: Four times a day (QID) | ORAL | Status: DC | PRN
  Administered 2015-01-15 – 2015-01-17 (×3): 650 mg via ORAL
  Filled 2015-01-14 (×3): qty 2

## 2015-01-14 MED ORDER — VANCOMYCIN HCL 1000 MG IV SOLR
500.0000 mg | INTRAVENOUS | Status: DC | PRN
  Administered 2015-01-14: 500 mg via INTRAVENOUS

## 2015-01-14 MED ORDER — FENTANYL CITRATE (PF) 100 MCG/2ML IJ SOLN
25.0000 ug | INTRAMUSCULAR | Status: DC | PRN
  Administered 2015-01-14: 25 ug via INTRAVENOUS

## 2015-01-14 MED ORDER — PHENYLEPHRINE HCL 10 MG/ML IJ SOLN
INTRAMUSCULAR | Status: DC | PRN
  Administered 2015-01-14 (×2): 200 ug via INTRAVENOUS
  Administered 2015-01-14 (×3): 100 ug via INTRAVENOUS

## 2015-01-14 MED ORDER — PHENOL 1.4 % MT LIQD
1.0000 | OROMUCOSAL | Status: DC | PRN
  Filled 2015-01-14: qty 177

## 2015-01-14 MED ORDER — VANCOMYCIN HCL 1000 MG IV SOLR: 1000.0000 mg | INTRAVENOUS | Status: DC | PRN

## 2015-01-14 MED ORDER — BISACODYL 10 MG RE SUPP
10.0000 mg | Freq: Every day | RECTAL | Status: DC | PRN
  Administered 2015-01-17 – 2015-01-18 (×2): 10 mg via RECTAL
  Filled 2015-01-14 (×2): qty 1

## 2015-01-14 MED ORDER — OXYCODONE HCL 5 MG PO TABS
5.0000 mg | ORAL_TABLET | ORAL | Status: DC | PRN
  Administered 2015-01-15 (×2): 10 mg via ORAL
  Administered 2015-01-15: 5 mg via ORAL
  Filled 2015-01-14 (×2): qty 1
  Filled 2015-01-14 (×2): qty 2

## 2015-01-14 MED ORDER — CEFAZOLIN SODIUM-DEXTROSE 2-3 GM-% IV SOLR
2.0000 g | Freq: Four times a day (QID) | INTRAVENOUS | Status: AC
  Administered 2015-01-14 – 2015-01-15 (×4): 2 g via INTRAVENOUS
  Filled 2015-01-14 (×4): qty 50

## 2015-01-14 MED ORDER — CHLORHEXIDINE GLUCONATE CLOTH 2 % EX PADS: 6.0000 | MEDICATED_PAD | Freq: Every day | CUTANEOUS | Status: DC

## 2015-01-14 MED ORDER — PROPOFOL 10 MG/ML IV BOLUS
INTRAVENOUS | Status: DC | PRN
  Administered 2015-01-14: 100 mg via INTRAVENOUS

## 2015-01-14 MED ORDER — METOCLOPRAMIDE HCL 5 MG/ML IJ SOLN: 5.0000 mg | Freq: Three times a day (TID) | INTRAMUSCULAR | Status: DC | PRN

## 2015-01-14 MED ORDER — TRAMADOL HCL 50 MG PO TABS
50.0000 mg | ORAL_TABLET | ORAL | Status: DC | PRN
  Administered 2015-01-15 – 2015-01-17 (×2): 100 mg via ORAL
  Administered 2015-01-19: 50 mg via ORAL
  Filled 2015-01-14: qty 1
  Filled 2015-01-14 (×2): qty 2

## 2015-01-14 SURGICAL SUPPLY — 55 items
BAG DECANTER STRL (MISCELLANEOUS) ×3 IMPLANT
BLADE SAW 1 (BLADE) ×3 IMPLANT
CANISTER SUCT 1200ML W/VALVE (MISCELLANEOUS) ×3 IMPLANT
CANISTER SUCT 3000ML (MISCELLANEOUS) ×6 IMPLANT
CAPT HIP HEMI 1 ×3 IMPLANT
CATH FOL LEG HOLDER (MISCELLANEOUS) ×3 IMPLANT
CEMENT HV SMART SET (Cement) ×6 IMPLANT
DRAPE INCISE IOBAN 66X60 STRL (DRAPES) ×3 IMPLANT
DRAPE SHEET LG 3/4 BI-LAMINATE (DRAPES) ×3 IMPLANT
DRAPE TABLE BACK 80X90 (DRAPES) ×3 IMPLANT
DRSG DERMACEA 8X12 NADH (GAUZE/BANDAGES/DRESSINGS) ×3 IMPLANT
DRSG OPSITE POSTOP 4X12 (GAUZE/BANDAGES/DRESSINGS) ×3 IMPLANT
DRSG OPSITE POSTOP 4X14 (GAUZE/BANDAGES/DRESSINGS) ×3 IMPLANT
DURAPREP 26ML APPLICATOR (WOUND CARE) ×6 IMPLANT
ELECT BLADE 6.5 EXT (BLADE) ×3 IMPLANT
ELECT CAUTERY BLADE 6.4 (BLADE) ×3 IMPLANT
GAUZE PACK 2X3YD (MISCELLANEOUS) ×3 IMPLANT
GAUZE SPONGE 4X4 12PLY STRL (GAUZE/BANDAGES/DRESSINGS) ×3 IMPLANT
GLOVE BIO SURGEON STRL SZ8 (GLOVE) ×3 IMPLANT
GLOVE BIOGEL M STRL SZ7.5 (GLOVE) ×6 IMPLANT
GLOVE BIOGEL PI IND STRL 9 (GLOVE) ×1 IMPLANT
GLOVE BIOGEL PI INDICATOR 9 (GLOVE) ×2
GLOVE INDICATOR 8.0 STRL GRN (GLOVE) ×3 IMPLANT
GOWN STRL REUS W/ TWL LRG LVL3 (GOWN DISPOSABLE) ×1 IMPLANT
GOWN STRL REUS W/TWL 2XL LVL3 (GOWN DISPOSABLE) ×3 IMPLANT
GOWN STRL REUS W/TWL LRG LVL3 (GOWN DISPOSABLE) ×2
GOWN STRL REUS W/TWL LRG LVL4 (GOWN DISPOSABLE) ×3 IMPLANT
GOWN STRL REUS W/TWL XL LVL4 (GOWN DISPOSABLE) ×3 IMPLANT
HANDPIECE SUCTION TUBG SURGILV (MISCELLANEOUS) ×3 IMPLANT
HEMOVAC 400CC 10FR (MISCELLANEOUS) ×3 IMPLANT
HOOD PEEL AWAY FACE SHEILD DIS (HOOD) ×6 IMPLANT
KIT RM TURNOVER STRD PROC AR (KITS) ×3 IMPLANT
NDL SAFETY 18GX1.5 (NEEDLE) ×3 IMPLANT
NEEDLE FILTER BLUNT 18X 1/2SAF (NEEDLE) ×2
NEEDLE FILTER BLUNT 18X1 1/2 (NEEDLE) ×1 IMPLANT
NS IRRIG 1000ML POUR BTL (IV SOLUTION) ×3 IMPLANT
PACK HIP PROSTHESIS (MISCELLANEOUS) ×3 IMPLANT
PAD GROUND ADULT SPLIT (MISCELLANEOUS) ×3 IMPLANT
PRESSURIZER CEMENT PROX FEM SM (MISCELLANEOUS) ×3 IMPLANT
PRESSURIZER FEM CANAL M (MISCELLANEOUS) ×3 IMPLANT
SOL .9 NS 3000ML IRR  AL (IV SOLUTION) ×2
SOL .9 NS 3000ML IRR UROMATIC (IV SOLUTION) ×1 IMPLANT
STAPLER SKIN PROX 35W (STAPLE) ×3 IMPLANT
SUT ETHIBOND #5 BRAIDED 30INL (SUTURE) ×3 IMPLANT
SUT VIC AB 0 CT1 36 (SUTURE) ×3 IMPLANT
SUT VIC AB 1 CT1 36 (SUTURE) ×6 IMPLANT
SUT VIC AB 2-0 CT1 27 (SUTURE) ×2
SUT VIC AB 2-0 CT1 TAPERPNT 27 (SUTURE) ×1 IMPLANT
SYR 20CC LL (SYRINGE) ×3 IMPLANT
SYR TB 1ML LUER SLIP (SYRINGE) ×3 IMPLANT
TAPE MICROFOAM 4IN (TAPE) ×3 IMPLANT
TAPE TRANSPORE STRL 2 31045 (GAUZE/BANDAGES/DRESSINGS) ×3 IMPLANT
TIP COAXIAL FEMORAL CANAL (MISCELLANEOUS) ×3 IMPLANT
TOWER CARTRIDGE SMART MIX (DISPOSABLE) ×3 IMPLANT
WATER STERILE IRR 1000ML POUR (IV SOLUTION) ×3 IMPLANT

## 2015-01-14 NOTE — Op Note (Signed)
OPERATIVE NOTE  DATE OF SURGERY:  01/13/2015 - 01/14/2015  PATIENT NAME:  Jenna Marshall   DOB: 1926/03/13  MRN: [id number]  PRE-OPERATIVE DIAGNOSIS: Left femoral neck fracture  POST-OPERATIVE DIAGNOSIS:  Same  PROCEDURE:  Left hip hemiarthroplasty  SURGEON:  Marciano Sequin. M.D.  ANESTHESIA: general  ESTIMATED BLOOD LOSS: 300 mL  FLUIDS REPLACED: 1300 mL of crystalloid  DRAINS: 2 medium drains to a Hemovac reservoir  IMPLANTS UTILIZED: DePuy size 3 Summit femoral stem (cemented), 11 mm Cementralizer, 44 mm OD Cathcart hip ball, +0 mm tapered spacer, and a size 3 femoral cement restrictor  INDICATIONS FOR SURGERY: Jenna Marshall is a 81 y.o. year old female who fell and sustained a displaced left femoral neck fracture. After discussion of the risks and benefits of surgical intervention, the patient expressed understanding of the risks benefits and agree with plans for hip hemiarthroplasty.   The risks, benefits, and alternatives were discussed at length including but not limited to the risks of infection, bleeding, nerve injury, stiffness, blood clots, the need for revision surgery, limb length inequality, dislocation, cardiopulmonary complications, among others, and they were willing to proceed.  PROCEDURE IN DETAIL: The patient was brought into the operating room and, after adequate general anesthesia was achieved, patient was placed in a right lateral decubitus position. Axillary roll was placed and all bony prominences were well-padded. The patient's left hip was cleaned and prepped with alcohol and DuraPrep and draped in the usual sterile fashion. A "timeout" was performed as per usual protocol. A lateral curvilinear incision was made gently curving towards the posterior superior iliac spine. The IT band was incised in line with the skin incision and the fibers of the gluteus maximus were split in line. The piriformis tendon was identified, skeletonized, and incised at its  insertion to the proximal femur and reflected posteriorly. A T type posterior capsulotomy was performed. The femoral head was then removed using a corkscrew device. The femoral head was measured using calipers and ring gauges and determined to be 44 mm in diameter.The femoral neck cut was performed using an oscillating saw. The acetabulum was inspected for any bony fragments. The articular surface was in good condition.  Attention was then directed to the proximal femur. A pilot hole for preparation of the proximal femoral canal was created using a high-speed bur. The femoral canal finder was inserted followed by insertion of the conical reamer. Serial broaches were inserted up to a size 3 broach. Calcar region was planed and a trial reduction was performed using a 44 mm OD Cathcart ball with a +0 mm neck length. Good equalization of limb lengths was appreciated and excellent stability was noted both anteriorly and posteriorly. Trial components were removed. The femoral canal was sized and was felt that a size 3 cement restrictor was appropriate. The cement restrictor was inserted to the appropriate depth in the femoral canal was irrigated with copious amounts of fluid using the pulse lavage and suctioned dry. The femoral canal was then packed with vaginal packing soaked in dilute Neo-Synephrine. Polymethylmethacrylate cement was per usual fashion using a vacuum mixer. Vaginal packing was removed and the canal again irrigated and suctioned dry. The polymethylmethacrylate cement was inserted in retrograde fashion and pressurized. The size 3 Summit femoral component with a 11 mm Cementralizer was positioned and impacted into place. Excess cement was removed using [employer] fast streamer. After adequate curing of the cement, the Morse taper was cleaned and dried. A 44 mm outer diameter Cathcart  hip ball with a +0 mm tapered spacer was placed on the trunnion and impacted into place. The acetabulum was again irrigated and  suctioned dry, making sure to inspect for any residal bony debris. The femoral head was then reduced and placed through a range of motion. Excellent stability was noted both anteriorly and posteriorly. Good equalization of limb lengths was appreciated.   The wound was irrigated with copious amounts of normal saline with antibiotic solution and suctioned dry. Good hemostasis was appreciated. The posterior capsulotomy was repaired using #5 Ethibond. Piriformis tendon was reapproximated to the undersurface of the gluteus medius tendon using #5 Ethibond. Two medium drains were placed in the wound bed and brought out through separate stab incisions to be attached to a Hemovac reservoir. The IT band was reapproximated using interrupted sutures of #1 Vicryl. Subcutaneous tissue was proximal phalanx using first #0 Vicryl followed by #2-0 Vicryl. The skin was closed with skin staples.  The patient tolerated the procedure well and was transported to the recovery room in stable condition.   Marciano Sequin., M.D.

## 2015-01-14 NOTE — Brief Op Note (Signed)
01/13/2015 - 01/14/2015  1:20 PM  PATIENT:  Jenna Marshall  81 y.o. female  PRE-OPERATIVE DIAGNOSIS:  left femoral neck fracture  POST-OPERATIVE DIAGNOSIS:  same  PROCEDURE:   Left hip hemiarthroplasty  SURGEON:  Surgeon(s) and Role:    * Dereck Leep, MD - Primary  ASSISTANTS: none   ANESTHESIA:   general  EBL:  Total I/O In: 1300 [I.V.:1300] Out: 500 [Urine:200; Blood:300]  BLOOD ADMINISTERED:none  DRAINS: 2 medium hemovac drains   LOCAL MEDICATIONS USED:  NONE  SPECIMEN:  Source of Specimen:  left femoral head  DISPOSITION OF SPECIMEN:  PATHOLOGY  COUNTS:  YES  TOURNIQUET:  * No tourniquets in log *  DICTATION: .Dragon Dictation  PLAN OF CARE: Admit to inpatient   PATIENT DISPOSITION:  PACU - hemodynamically stable.   Delay start of Pharmacological VTE agent (>24hrs) due to surgical blood loss or risk of bleeding: yes

## 2015-01-14 NOTE — Progress Notes (Signed)
Initial Nutrition Assessment    INTERVENTION:   Medical Food Supplement Therapy: pt would likely benefit from addition of EnsureEnlive once diet advanced due to age and post hip fracture   NUTRITION DIAGNOSIS:   Inadequate oral intake related to acute illness as evidenced by NPO status.  GOAL:   Patient will meet greater than or equal to 90% of their needs   MONITOR:    (Energy Intake, Anthropometrics, Digestive System)  REASON FOR ASSESSMENT:   Consult Hip fracture protocol  ASSESSMENT:    Pt admitted with left femur fracture s/p Left hip hemiarthroplasty today; not in room on visit today  Past Medical History  Diagnosis Date  . Hyperlipemia   . CAD (coronary artery disease)     a. Lexiscan 10/13/14: mid anterior to apical & inf wall ischemia w/ WMA, mild to mod dep EF; b. cardiac cath 10/14/2014: ost LM to LM 50%, LM 99%, ost LAD 95%, ost LAD 95%, prox LAD to mid LAD 40%, prox RCA 30%, mid RCA 70%, Critical left main stenosis. Significant distal RCA stenosis. Heavily calcified arteries. Normal EF by echo with no significant AS or MR. Recommend urgent CABG. cCABG x 4 (5/201  . GERD (gastroesophageal reflux disease)   . Macular degeneration   . Hyperlipidemia   . Hypertension   . Hypothyroidism   . Depression   . pernicious anemia   . Varicose veins   . CAD (coronary artery disease)     a. Lexi 10/13/14: mid ant to apical & inf wall ischemia w/ WMA, mild-mod dep EF; b. cath 10/14/14: ost LM to LM 50%, dLM 99%, ost LAD 95%, ost LAD 95%, pLAD-LAD 40%, pRCA 30%, mRCA 70%, Critical LM stenosis. Sig dRCA stenosis. Heavily calcified arteries; c. CABG x 4 (10/2014)  . Diastolic dysfunction     a. echo 10/2014: EF 55-60%, no RWMA, GR1DD, mild AI, trivial MR, mildly dilated LA, PASP normal   Past Surgical History  Procedure Laterality Date  . Cardiac catheterization Left 10/14/2014    Procedure: Left Heart Cath and Coronary Angiography;  Surgeon: Wellington Hampshire, MD;  Location:  [place] [facility];  Service: Cardiovascular;  Laterality: Left;  . Coronary artery bypass graft N/A 10/14/2014    Procedure: CORONARY ARTERY BYPASS GRAFTING (CABG), ON PUMP, TIMES FOUR, USING LEFT INTERNAL MAMMARY ARTERY, RIGHT GREATER SAPHENOUS VEIN HARVESTED ENDOSCOPICALLY;  Surgeon: Gaye Pollack, MD;  Location: [facility];  Service: Open Heart Surgery;  Laterality: N/A;  . Coronary artery bypass graft  10/2014    LIMA-->LAD, SVG-->RCA, sequential SVG-->Ramus and OM  . Varicose vein surgery    . Hemorrhoid surgery      Diet Order:  Diet NPO time specified Except for: Sips with Meds   Food and nutrition related history: unable to assess as pt not in room  Electrolyte and Renal Profile:  Recent Labs Lab 01/13/15 1619 01/14/15 0429  BUN 25* 21*  CREATININE 0.93 0.87  NA 137 139  K 3.9 4.4   Glucose Profile: No results for input(s): GLUCAP in the last 72 hours. Protein Profile: No results for input(s): ALBUMIN in the last 168 hours.  Meds: MVI, fleet enema prn  Skin:  Reviewed, no issues  Last BM:  8/12  Height:   Ht Readings from Last 1 Encounters:  01/13/15 5\' 4"  (1.626 m)    Weight: weight appears relatively stable based on weight encounters  Wt Readings from Last 1 Encounters:  01/13/15 179 lb (81.194 kg)   Wt Readings  from Last 10 Encounters:  01/13/15 179 lb (81.194 kg)  01/02/15 182 lb (82.555 kg)  12/29/14 181 lb 8 oz (82.328 kg)  12/01/14 179 lb (81.194 kg)  11/30/14 182 lb 6.4 oz (82.736 kg)  11/23/14 182 lb 8 oz (82.781 kg)  11/14/14 179 lb (81.194 kg)  10/26/14 188 lb 12.8 oz (85.639 kg)  10/21/14 193 lb 9.6 oz (87.816 kg)  10/14/14 188 lb 8 oz (85.503 kg)    Ideal Body Weight:   54.5 kg  BMI:  Body mass index is 30.71 kg/(m^2).  LOW Care Level  Kerman Passey MS, [place], LDN [phone] Pager

## 2015-01-14 NOTE — Anesthesia Preprocedure Evaluation (Signed)
Anesthesia Evaluation  Patient identified by MRN, date of birth, ID band Patient awake    Reviewed: Allergy & Precautions, H&P , NPO status , Patient's Chart, lab work & pertinent test results, reviewed documented beta blocker date and time   History of Anesthesia Complications Negative for: history of anesthetic complications  Airway Mallampati: II  TM Distance: >3 FB Neck ROM: limited    Dental  (+) Poor Dentition, Partial Upper   Pulmonary neg pulmonary ROS,  breath sounds clear to auscultation  Pulmonary exam normal       Cardiovascular Exercise Tolerance: Good hypertension, On Medications and On Home Beta Blockers + angina (Unstable angina prior to surgery, but none since) + CAD and + CABG (4 vessel CABG in May 2016) - Past MI, - Cardiac Stents and - CHF Normal cardiovascular exam- dysrhythmias - Valvular Problems/MurmursRhythm:regular Rate:Normal     Neuro/Psych PSYCHIATRIC DISORDERS (depression) negative neurological ROS     GI/Hepatic Neg liver ROS, GERD-  Medicated and Controlled,  Endo/Other  neg diabetesHypothyroidism   Renal/GU negative Renal ROS  negative genitourinary   Musculoskeletal   Abdominal   Peds  Hematology  (+) Blood dyscrasia (pernicious anemia), anemia ,   Anesthesia Other Findings Past Medical History:   Hyperlipemia                                                 CAD (coronary artery disease)                                  Comment:a. Lexiscan 10/13/14: mid anterior to apical &               inf wall ischemia w/ WMA, mild to mod dep EF;               b. cardiac cath 10/14/2014: ost LM to LM 50%, LM              99%, ost LAD 95%, ost LAD 95%, prox LAD to mid               LAD 40%, prox RCA 30%, mid RCA 70%, Critical               left main stenosis. Significant distal RCA               stenosis. Heavily calcified arteries. Normal EF              by echo with no significant AS or MR.  Recommend              urgent CABG. cCABG x 4 (5/201   GERD (gastroesophageal reflux disease)                       Macular degeneration                                         Hyperlipidemia  Hypertension                                                 Hypothyroidism                                               Depression                                                   pernicious anemia                                            Varicose veins                                               CAD (coronary artery disease)                                  Comment:a. Lexi 10/13/14: mid ant to apical & inf wall               ischemia w/ WMA, mild-mod dep EF; b. cath               10/14/14: ost LM to LM 50%, dLM 99%, ost LAD               95%, ost LAD 95%, pLAD-LAD 40%, pRCA 30%, mRCA               70%, Critical LM stenosis. Sig dRCA stenosis.               Heavily calcified arteries; c. CABG x 4               (09/979)   Diastolic dysfunction                                          Comment:a. echo 10/2014: EF 55-60%, no RWMA, GR1DD, mild              AI, trivial MR, mildly dilated LA, PASP normal   Reproductive/Obstetrics negative OB ROS                             Anesthesia Physical Anesthesia Plan  ASA: III  Anesthesia Plan: General   Post-op Pain Management:    Induction:   Airway Management Planned:   Additional Equipment:   Intra-op Plan:   Post-operative Plan:   Informed Consent: I have reviewed the patients History and Physical, chart, labs and discussed the procedure including the risks, benefits and alternatives for the proposed anesthesia with the patient or authorized representative who has indicated his/her understanding and acceptance.  Dental Advisory Given  Plan Discussed with: Anesthesiologist, CRNA and Surgeon  Anesthesia Plan Comments:         Anesthesia Quick Evaluation

## 2015-01-14 NOTE — Anesthesia Procedure Notes (Signed)
Procedure Name: Intubation Date/Time: 01/14/2015 9:52 AM Performed by: Aline Brochure Pre-anesthesia Checklist: Patient identified, Patient being monitored, Timeout performed, Emergency Drugs available and Suction available Patient Re-evaluated:Patient Re-evaluated prior to inductionOxygen Delivery Method: Circle system utilized Preoxygenation: Pre-oxygenation with 100% oxygen Intubation Type: IV induction Ventilation: Mask ventilation without difficulty Laryngoscope Size: Mac and 3 Grade View: Grade II Tube type: Oral Tube size: 7.0 mm Number of attempts: 1 Airway Equipment and Method: Stylet Placement Confirmation: ETT inserted through vocal cords under direct vision,  positive ETCO2 and breath sounds checked- equal and bilateral Secured at: 21 cm Tube secured with: Tape Dental Injury: Teeth and Oropharynx as per pre-operative assessment

## 2015-01-14 NOTE — Transfer of Care (Signed)
Immediate Anesthesia Transfer of Care Note  Patient: Jenna Marshall  Procedure(s) Performed: Procedure(s): ARTHROPLASTY BIPOLAR HIP (HEMIARTHROPLASTY) (Left)  Patient Location: PACU  Anesthesia Type:General  Level of Consciousness: awake  Airway & Oxygen Therapy: Patient Spontanous Breathing and Patient connected to face mask oxygen  Post-op Assessment: Report given to RN and Post -op Vital signs reviewed and stable  Post vital signs: Reviewed and stable  Last Vitals:  Filed Vitals:   01/14/15 1313  BP: 125/52  Pulse: 81  Temp: 36.5 C  Resp: 14    Complications: No apparent anesthesia complications

## 2015-01-14 NOTE — Progress Notes (Signed)
[facility] [place] NAME: Jenna Marshall    MR#:  [id number]  DATE OF BIRTH:  1925/08/19  SUBJECTIVE:  CHIEF COMPLAINT:   Chief Complaint  Patient presents with  . Hip Pain   no complaint.  Status post Left hip hemiarthroplasty  REVIEW OF SYSTEMS:  CONSTITUTIONAL: No fever, fatigue or weakness.  EYES: No blurred or double vision.  EARS, NOSE, AND THROAT: No tinnitus or ear pain.  RESPIRATORY: No cough, shortness of breath, wheezing or hemoptysis.  CARDIOVASCULAR: No chest pain, orthopnea, edema.  GASTROINTESTINAL: No nausea, vomiting, diarrhea or abdominal pain.  GENITOURINARY: No dysuria, hematuria.  ENDOCRINE: No polyuria, nocturia,  HEMATOLOGY: No anemia, easy bruising or bleeding SKIN: No rash or lesion. MUSCULOSKELETAL: Right hip pain NEUROLOGIC: No tingling, numbness, weakness.  PSYCHIATRY: No anxiety or depression.   DRUG ALLERGIES:   Allergies  Allergen Reactions  . Ambien [Zolpidem] Other (See Comments)    Reaction:  Hallucinations  . Codeine Nausea And Vomiting    VITALS:  Blood pressure 110/50, pulse 81, temperature 96.9 F (36.1 C), temperature source Oral, resp. rate 16, height 5\' 4"  (1.626 m), weight 81.194 kg (179 lb), SpO2 90 %.  PHYSICAL EXAMINATION:  GENERAL:  81 y.o.-year-old patient lying in the bed with no acute distress.  EYES: Pupils equal, round, reactive to light and accommodation. No scleral icterus. Extraocular muscles intact.  HEENT: Head atraumatic, normocephalic. Oropharynx and nasopharynx clear.  NECK:  Supple, no jugular venous distention. No thyroid enlargement, no tenderness.  LUNGS: Normal breath sounds bilaterally, no wheezing, rales,rhonchi or crepitation. No use of accessory muscles of respiration.  CARDIOVASCULAR: S1, S2 normal. No murmurs, rubs, or gallops.  ABDOMEN: Soft, nontender, nondistended. Bowel sounds present. No organomegaly or mass.  EXTREMITIES: No pedal edema,  cyanosis, or clubbing. Bloody drainage in the bag from left hip wound. NEUROLOGIC: Cranial nerves II through XII are intact. Muscle strength 5/5 in all extremities except the left lower extremity. Sensation intact. Gait not checked.  PSYCHIATRIC: The patient is alert and oriented x 3.  SKIN: No obvious rash, lesion, or ulcer.    LABORATORY PANEL:   CBC  Recent Labs Lab 01/14/15 0429  WBC 11.6*  HGB 10.9*  HCT 32.3*  PLT 119*   ------------------------------------------------------------------------------------------------------------------  Chemistries   Recent Labs Lab 01/14/15 0429  NA 139  K 4.4  CL 106  CO2 28  GLUCOSE 120*  BUN 21*  CREATININE 0.87  CALCIUM 8.5*   ------------------------------------------------------------------------------------------------------------------  Cardiac Enzymes No results for input(s): TROPONINI in the last 168 hours. ------------------------------------------------------------------------------------------------------------------  RADIOLOGY:  Dg Chest 1 View  01/13/2015   CLINICAL DATA:  [place] today.  Left hip pain.  EXAM: CHEST  1 VIEW  COMPARISON:  11/23/2014  FINDINGS: The cardiac silhouette, mediastinal and hilar contours are stable. Mild cardiac enlargement. Stable surgical changes from bypass surgery. The lungs are grossly clear. The bony thorax is intact.  IMPRESSION: Cardiac enlargement but no definite acute pulmonary findings.   Electronically Signed   By: Marijo Sanes M.D.   On: 01/13/2015 16:20   Dg Hip Port Unilat With Pelvis 1v Left  01/14/2015   CLINICAL DATA:  Status post left hip arthroplasty to treat subcapital hip fracture.  EXAM: DG HIP (WITH OR WITHOUT PELVIS) 1V PORT LEFT  COMPARISON:  01/13/2015  FINDINGS: Alignment of a bipolar left hip arthroplasty appears normal. No evidence of fracture or abnormal lucency surrounding hardware.  IMPRESSION: Normal alignment following left hip arthroplasty  Electronically  Signed   By: Aletta Edouard M.D.   On: 01/14/2015 14:03   Dg Hip Unilat With Pelvis 2-3 Views Left  01/13/2015   CLINICAL DATA:  Patient fell today at home landing on her left hip and now has left hip pain, decreased ROM, and is unable to stand or walk. Patient has also recently had heart surgery.  EXAM: DG HIP (WITH OR WITHOUT PELVIS) 2-3V LEFT  COMPARISON:  None.  FINDINGS: There is a fracture of the mid cervical back, with no significant comminution, but with superior displacement of the distal fracture component approximate 1.5 cm, as well as mild varus angulation and mild apex anterior angulation.  No other fractures. Hip joints are normally aligned. Bones are diffusely demineralized.  IMPRESSION: 1. Left femoral neck fracture, without significant comminution, mildly displaced and angulated.   Electronically Signed   By: Lajean Manes M.D.   On: 01/13/2015 16:20    EKG:   Orders placed or performed during the hospital encounter of 01/13/15  . ED EKG  . ED EKG  . EKG 12-Lead  . EKG 12-Lead    ASSESSMENT AND PLAN:   1. Left femoral neck fracture. Status post Left hip hemiarthroplasty today. Encourage deep deep breaths, DVT prophylaxis per Dr. Marry Guan, follow-up CBC.  2. History of coronary artery disease- continue low-dose Coreg. May resume aspirin tomorrow. 3. Hyperlipidemia,  continue atorvastatin. 4. Hypothyroidism,  continue levothyroxine. * Anemia. Follow-up hemoglobin     All the records are reviewed and case discussed with Care Management/Social Workerr. Management plans discussed with the patient, family and they are in agreement.  CODE STATUS: DO NOT RESUSCITATE  TOTAL TIME TAKING CARE OF THIS PATIENT: 33 minutes.   POSSIBLE D/C IN 3 DAYS, DEPENDING ON CLINICAL CONDITION.   Demetrios Loll M.D on 01/14/2015 at 9:25 PM  Between 7am to 6pm - Pager - [phone]  After 6pm go to [email or web address] - password [email or web address] [facility]  Office   [phone]  CC: Primary care physician; [name], MD

## 2015-01-15 LAB — CBC
HCT: 26.1 % — ABNORMAL LOW (ref 35.0–47.0)
HEMOGLOBIN: 8.8 g/dL — AB (ref 12.0–16.0)
MCH: 32.2 pg (ref 26.0–34.0)
MCHC: 33.7 g/dL (ref 32.0–36.0)
MCV: 95.4 fL (ref 80.0–100.0)
Platelets: 91 10*3/uL — ABNORMAL LOW (ref 150–440)
RBC: 2.74 MIL/uL — AB (ref 3.80–5.20)
RDW: 15 % — AB (ref 11.5–14.5)
WBC: 12.3 10*3/uL — ABNORMAL HIGH (ref 3.6–11.0)

## 2015-01-15 LAB — BASIC METABOLIC PANEL
Anion gap: 4 — ABNORMAL LOW (ref 5–15)
BUN: 20 mg/dL (ref 6–20)
CO2: 28 mmol/L (ref 22–32)
Calcium: 8.3 mg/dL — ABNORMAL LOW (ref 8.9–10.3)
Chloride: 105 mmol/L (ref 101–111)
Creatinine, Ser: 0.87 mg/dL (ref 0.44–1.00)
GFR, EST NON AFRICAN AMERICAN: 58 mL/min — AB (ref >60)
GLUCOSE: 129 mg/dL — AB (ref 65–99)
POTASSIUM: 4.3 mmol/L (ref 3.5–5.1)
SODIUM: 137 mmol/L (ref 135–145)

## 2015-01-15 LAB — HEMOGLOBIN: Hemoglobin: 8.6 g/dL — ABNORMAL LOW (ref 12.0–16.0)

## 2015-01-15 MED ORDER — SODIUM CHLORIDE 0.9 % IV SOLN
INTRAVENOUS | Status: DC
  Administered 2015-01-15: 16:00:00 via INTRAVENOUS

## 2015-01-15 NOTE — Progress Notes (Signed)
Jenna Marshall at [place] NAME: Jenna Marshall    MR#:  [id number]  DATE OF BIRTH:  01/16/1926  SUBJECTIVE:  CHIEF COMPLAINT:   Chief Complaint  Patient presents with  . Hip Pain   no complaint.  Status post Left hip hemiarthroplasty POD-1  REVIEW OF SYSTEMS:  CONSTITUTIONAL: No fever, fatigue or weakness.  EYES: No blurred or double vision.  EARS, NOSE, AND THROAT: No tinnitus or ear pain.  RESPIRATORY: No cough, shortness of breath, wheezing or hemoptysis.  CARDIOVASCULAR: No chest pain, orthopnea, edema.  GASTROINTESTINAL: No nausea, vomiting, diarrhea or abdominal pain.  GENITOURINARY: No dysuria, hematuria.  ENDOCRINE: No polyuria, nocturia,  HEMATOLOGY: No anemia, easy bruising or bleeding SKIN: No rash or lesion. MUSCULOSKELETAL: Right hip pain NEUROLOGIC: No tingling, numbness, weakness.  PSYCHIATRY: No anxiety or depression.   DRUG ALLERGIES:   Allergies  Allergen Reactions  . Ambien [Zolpidem] Other (See Comments)    Reaction:  Hallucinations  . Codeine Nausea And Vomiting    VITALS:  Blood pressure 108/48, pulse 91, temperature 97.3 F (36.3 C), temperature source Axillary, resp. rate 18, height 5\' 4"  (1.626 m), weight 81.194 kg (179 lb), SpO2 94 %.  PHYSICAL EXAMINATION:  GENERAL:  81 y.o.-year-old patient lying in the bed with no acute distress.  EYES: Pupils equal, round, reactive to light and accommodation. No scleral icterus. Extraocular muscles intact.  HEENT: Head atraumatic, normocephalic. Oropharynx and nasopharynx clear.  NECK:  Supple, no jugular venous distention. No thyroid enlargement, no tenderness.  LUNGS: Normal breath sounds bilaterally, no wheezing, rales,rhonchi or crepitation. No use of accessory muscles of respiration.  CARDIOVASCULAR: S1, S2 normal. No murmurs, rubs, or gallops.  ABDOMEN: Soft, nontender, nondistended. Bowel sounds present. No organomegaly or mass.  EXTREMITIES: No pedal  edema, cyanosis, or clubbing. Bloody drainage in the bag from left hip wound. NEUROLOGIC: Cranial nerves II through XII are intact. Muscle strength 5/5 in all extremities except the left lower extremity. Sensation intact. Gait not checked.  PSYCHIATRIC: The patient is alert and awake. SKIN: No obvious rash, lesion, or ulcer.    LABORATORY PANEL:   CBC  Recent Labs Lab 01/15/15 0436  WBC 12.3*  HGB 8.8*  HCT 26.1*  PLT 91*   ------------------------------------------------------------------------------------------------------------------  Chemistries   Recent Labs Lab 01/15/15 0436  NA 137  K 4.3  CL 105  CO2 28  GLUCOSE 129*  BUN 20  CREATININE 0.87  CALCIUM 8.3*   ------------------------------------------------------------------------------------------------------------------  Cardiac Enzymes No results for input(s): TROPONINI in the last 168 hours. ------------------------------------------------------------------------------------------------------------------  RADIOLOGY:  Dg Chest 1 View  01/13/2015   CLINICAL DATA:  [place] today.  Left hip pain.  EXAM: CHEST  1 VIEW  COMPARISON:  11/23/2014  FINDINGS: The cardiac silhouette, mediastinal and hilar contours are stable. Mild cardiac enlargement. Stable surgical changes from bypass surgery. The lungs are grossly clear. The bony thorax is intact.  IMPRESSION: Cardiac enlargement but no definite acute pulmonary findings.   Electronically Signed   By: Marijo Sanes M.D.   On: 01/13/2015 16:20   Dg Hip Port Unilat With Pelvis 1v Left  01/14/2015   CLINICAL DATA:  Status post left hip arthroplasty to treat subcapital hip fracture.  EXAM: DG HIP (WITH OR WITHOUT PELVIS) 1V PORT LEFT  COMPARISON:  01/13/2015  FINDINGS: Alignment of a bipolar left hip arthroplasty appears normal. No evidence of fracture or abnormal lucency surrounding hardware.  IMPRESSION: Normal alignment following left hip arthroplasty   Electronically  Signed    By: Aletta Edouard M.D.   On: 01/14/2015 14:03   Dg Hip Unilat With Pelvis 2-3 Views Left  01/13/2015   CLINICAL DATA:  Patient fell today at home landing on her left hip and now has left hip pain, decreased ROM, and is unable to stand or walk. Patient has also recently had heart surgery.  EXAM: DG HIP (WITH OR WITHOUT PELVIS) 2-3V LEFT  COMPARISON:  None.  FINDINGS: There is a fracture of the mid cervical back, with no significant comminution, but with superior displacement of the distal fracture component approximate 1.5 cm, as well as mild varus angulation and mild apex anterior angulation.  No other fractures. Hip joints are normally aligned. Bones are diffusely demineralized.  IMPRESSION: 1. Left femoral neck fracture, without significant comminution, mildly displaced and angulated.   Electronically Signed   By: Lajean Manes M.D.   On: 01/13/2015 16:20    EKG:   Orders placed or performed during the hospital encounter of 01/13/15  . ED EKG  . ED EKG  . EKG 12-Lead  . EKG 12-Lead    ASSESSMENT AND PLAN:   * Left femoral neck fracture. Status post Left hip hemiarthroplasty POD-1 Encourage deep deep breaths DVT prophylaxis per Dr. Marry Guan, follow-up CBC.  * Acute blood loss anemia s/p surgery Monitor. No transfusion at this time unless < 7. Repeat in AM  * History of coronary artery disease- continue low-dose Coreg. ASA.  * Hyperlipidemia,  continue atorvastatin.  * Hypothyroidism,  continue levothyroxine.   All the records are reviewed and case discussed with Care Management/Social Workerr. Management plans discussed with the patient, family and they are in agreement.  CODE STATUS: DO NOT RESUSCITATE  TOTAL TIME TAKING CARE OF THIS PATIENT: 33 minutes.   POSSIBLE D/C IN 3 DAYS, DEPENDING ON CLINICAL CONDITION.   Hillary Bow R M.D on 01/15/2015 at 9:30 AM  Between 7am to 6pm - Pager - [phone]  After 6pm go to [email or web address] - password [email or web address] [facility]  Office  [phone]  CC: Primary care physician; [name], MD

## 2015-01-15 NOTE — Progress Notes (Signed)
Physical Therapy Evaluation Patient Details Name: Jenna Marshall MRN: [id number] DOB: 05/25/1926 Today's Date: 01/15/2015   History of Present Illness  Jenna Marshall is a 81 y.o. female with a known history of coronary artery disease with CABG 3 months ago. She presents after hitting her toe on a chair and a fall. In the ER, she was found to have a left femoral neck fracture. pt underwent L hip ORIF 01/14/15. WBAT  Clinical Impression  Pt presents as 81 y/o F with recent CABG, now s/p L hip ORIF following a fall. Pt participated well with therex, however became very anxious during bed mobility and had difficulty following PT cues/instructions. Pt was able to get up to sitting EOB x 5 min with max assist for supine to sit. Deferred standing / transfer today as pt need mod assist sitting EOB, O2 sat 85-87%  And was quite anxious. Pt required +2 assist to get positioned back in bed. Following treatment reports "it was not as bad as I thought" and hopes to be able to do more independently tomorrow. Pt would benefit from continued skilled PT services to address pain, mobility, strength to maximize function.     Follow Up Recommendations SNF    Equipment Recommendations       Recommendations for Other Services       Precautions / Restrictions Precautions Precautions: Fall Restrictions Weight Bearing Restrictions: Yes LLE Weight Bearing: Weight bearing as tolerated      Mobility  Bed Mobility Overal bed mobility: Needs Assistance;+2 for physical assistance Bed Mobility: Rolling;Supine to Sit;Sit to Supine Rolling: Max assist   Supine to sit: Max assist;HOB elevated Sit to supine: +2 for physical assistance;Max assist   General bed mobility comments: sit to supine- +2 to slide up in bed. pt quite anxious throughout needing cues to follow PT instructions. pt sat EOB x 5 min. O2 sat 85-87%  Transfers                 General transfer comment: deferred at this time. pt very anxious  and O2sat low  Ambulation/Gait             General Gait Details: not appropriate today  Stairs            Wheelchair Mobility    Modified Rankin (Stroke Patients Only)       Balance Overall balance assessment: Needs assistance Sitting-balance support: Bilateral upper extremity supported Sitting balance-Leahy Scale: Fair   Postural control: Posterior lean;Right lateral lean                                   Pertinent Vitals/Pain Pain Assessment: 0-10 Pain Score: 6  Pain Descriptors / Indicators: Aching;Sharp (L hip. also c/o L mid back pain- no abnormality visualized) Pain Intervention(s): Limited activity within patient's tolerance;Monitored during session;Premedicated before session;Repositioned    Home Living Family/patient expects to be discharged to:: Skilled nursing facility Living Arrangements: Alone               Additional Comments: 1 step to enter    Prior Function Level of Independence: Independent with assistive device(s)         Comments: household ambulation with SPC, family helps with IADL     Hand Dominance   Dominant Hand: Right    Extremity/Trunk Assessment   Upper Extremity Assessment: Overall WFL for tasks assessed  Lower Extremity Assessment: Generalized weakness (deferred LLE MMT due to post op status/pain)      Cervical / Trunk Assessment: Kyphotic  Communication   Communication: HOH;No difficulties  Cognition Arousal/Alertness: Awake/alert Behavior During Therapy: Anxious Overall Cognitive Status: Within Functional Limits for tasks assessed                      General Comments      Exercises Total Joint Exercises Ankle Circles/Pumps: Strengthening;Right;Left;10 reps Quad Sets: Strengthening;10 reps;Left Gluteal Sets: Strengthening;Both;10 reps Hip ABduction/ADduction: AAROM;Left;10 reps Long Arc Quad: AROM;Left;10 reps Knee Flexion: AAROM;Left;10 reps    Theract: Supine to sit, sitting EOB, sit to supine See mobility     Assessment/Plan    PT Assessment Patient needs continued PT services  PT Diagnosis Difficulty walking;Generalized weakness   PT Problem List Decreased strength;Decreased range of motion;Decreased activity tolerance;Decreased balance;Decreased mobility;Pain;Decreased knowledge of use of DME  PT Treatment Interventions DME instruction;Gait training;Stair training;Functional mobility training;Therapeutic activities;Therapeutic exercise;Neuromuscular re-education;Balance training;Modalities   PT Goals (Current goals can be found in the Care Plan section) Acute Rehab PT Goals Patient Stated Goal: to walk PT Goal Formulation: With patient Time For Goal Achievement: 01/29/15 Potential to Achieve Goals: Fair    Frequency BID   Barriers to discharge        Co-evaluation               End of Session Equipment Utilized During Treatment: Oxygen (2L/min) Activity Tolerance: Patient limited by fatigue;Patient limited by pain (limited by anxiety) Patient left: in bed;with call bell/phone within reach;with bed alarm set;with SCD's reapplied           Time: 3005-1102 PT Time Calculation (min) (ACUTE ONLY): 45 min   Charges:   PT Evaluation $Initial PT Evaluation Tier I: 1 Procedure $PT Re-evaluation: 1 Procedure PT Treatments $Therapeutic Activity: 8-22 mins   PT G Codes:       Laurelai Lepp C. Aime Carreras, PT, DPT [phone]  Nikan Ellingson 01/15/2015, 12:53 PM

## 2015-01-15 NOTE — Clinical Social Work Placement (Signed)
   CLINICAL SOCIAL WORK PLACEMENT  NOTE  Date:  01/15/2015  Patient Details  Name: Tiphany Fayson MRN: [id number] Date of Birth: Oct 13, 1925  Clinical Social Work is seeking post-discharge placement for this patient at the [place] level of care (*CSW will initial, date and re-position this form in  chart as items are completed):  Yes   Patient/family provided with [place] Work Department's list of facilities offering this level of care within the geographic area requested by the patient (or if unable, by the patient's family).  Yes   Patient/family informed of their freedom to choose among providers that offer the needed level of care, that participate in Medicare, Medicaid or managed care program needed by the patient, have an available bed and are willing to accept the patient.  Yes   Patient/family informed of [facility]'s ownership interest in [facility] and [facility], as well as of the fact that they are under no obligation to receive care at these facilities.  PASRR submitted to EDS on       PASRR number received on       Existing PASRR number confirmed on 01/15/15     FL2 transmitted to all facilities in geographic area requested by pt/family on 01/15/15     FL2 transmitted to all facilities within larger geographic area on       Patient informed that his/her managed care company has contracts with or will negotiate with certain facilities, including the following:            Patient/family informed of bed offers received.  Patient chooses bed at       Physician recommends and patient chooses bed at      Patient to be transferred to   on  .  Patient to be transferred to facility by       Patient family notified on   of transfer.  Name of family member notified:        PHYSICIAN Please sign FL2     Additional Comment:  CSW will provide bed offers to patient  _______________________________________________ Maurine Cane, LCSW 01/15/2015, 11:43 AM [name]. Latanya Presser, MSW Clinical Social Work Department Emergency Room [phone] 11:43 AM

## 2015-01-15 NOTE — Clinical Social Work Note (Signed)
Clinical Social Work Assessment  Patient Details  Name: Jenna Marshall MRN: [id number] Date of Birth: [birth year]  Date of referral:  01/15/15               Reason for consult:  Facility Placement                Permission sought to share information with:  Facility Sport and exercise psychologist, Family Supports Permission granted to share information::  Yes, Verbal Permission Granted  Name::      ([place] [employer] aid/development worker and Doctor, general practice)  Agency::   (SNF for bed search)  Relationship::     Contact Information:   (Daughter Jenna Marshall ([place]) [phone] hm   and Jenna Marshall [phone])  Housing/Transportation Living arrangements for the past 2 months:  [place] of Information:  Patient, Adult Children Patient Interpreter Needed:  None Criminal Activity/Legal Involvement Pertinent to Current Situation/Hospitalization:  No - Comment as needed Significant Relationships:  Adult Children Lives with:  Self Do you feel safe going back to the place where you live?  Yes Need for family participation in patient care:  Yes (Comment)  Care giving concerns:  Concern that patient needs to go to rehab before returning home.   Social Worker assessment / plan:  CSW in to assess patient for possible SNF.  Patient is engaged in conversation but is hard of hearing. Wears hearing aid in right ear.  Patient's daughter at bedside also providing information.  Patient currently lives alone.  She still has home health with PT & an RN via [place].  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 She has three adult children.  Her son and daughter Jenna Marshall both check on patient daily when she is home.   When home patient uses a walker when ambulating at home and was about to be released from home physical therapy. Patient enjoys cleaning her home and cooking.  Patient is in agreement with going to rehab and would like [facility] or [facility].   Patient waiting on evaluation from PT.  CSW will complete the FL2 in  anticipation of patient discharging to SNF   Employment status:  Retired Nurse, adult PT Recommendations:  Not assessed at this time (anticipated SNF for rehab) Information / Referral to community resources:  Acute Rehab  Patient/Family's Response to care:  Patient and daughter would like patient to go to rehab at discharge.  Patient/Family's Understanding of and Emotional Response to Diagnosis, Current Treatment, and Prognosis:   Patient and daughter understands that physical therapy will come to evaluation patient with a recommendation.  They are in agreement with going to SNF for rehab.   Emotional Assessment Appearance:  Appears stated age Attitude/Demeanor/Rapport:   (possitive ) Affect (typically observed):  Accepting, Appropriate, Calm, Pleasant Orientation:  Oriented to Self, Oriented to Place, Oriented to  Time, Oriented to Situation Alcohol / Substance use:  Not Applicable Psych involvement (Current and /or in the community):     Discharge Needs  Concerns to be addressed:  Home Safety Concerns, Care Coordination (unable to meet ADLS home alone) Readmission within the last 30 days:  No Current discharge risk:  Dependent with Mobility, Lives alone Barriers to Discharge:  No Barriers Identified   Jenna Cane, LCSW 01/15/2015, 11:08 AM Jenna Marshall. Latanya Presser, MSW Clinical Social Work Department Emergency Room [phone] 11:10 AM

## 2015-01-15 NOTE — Care Management Note (Signed)
Case Management Note  Patient Details  Name: Latissa Frick MRN: [id number] Date of Birth: July 02, 1925  Subjective/Objective:      PT is recommending SNF and social work is working on placement options following 81yo Mrs Voisin's left hemiarthroscopy on 01/14/15. Ms Woolridge currently resides at home and is an open client of [facility] for home health PT. This Probation officer notified [place] per Ms Whitener's next home PT visit is scheduled for tomorrow. Ms Biehler is also followed at home by [facility]). Case management will follow for discharge planning in the event that Ms Mikel is not discharged to SNF.               Action/Plan:   Expected Discharge Date:                  Expected Discharge Plan:     In-House Referral:     Discharge planning Services     Post Acute Care Choice:    Choice offered to:     DME Arranged:    DME Agency:     HH Arranged:    [place] Agency:     Status of Service:     Medicare Important Message Given:    Date Medicare IM Given:    Medicare IM give by:    Date Additional Medicare IM Given:    Additional Medicare Important Message give by:     If discussed at [place] of Stay Meetings, dates discussed:    Additional Comments:  Jacari Iannello A, RN 01/15/2015, 5:45 PM

## 2015-01-15 NOTE — Anesthesia Postprocedure Evaluation (Signed)
  Anesthesia Post-op Note  Patient: Jenna Marshall  Procedure(s) Performed: Procedure(s): ARTHROPLASTY BIPOLAR HIP (HEMIARTHROPLASTY) (Left)  Anesthesia type:General  Patient location: PACU  Post pain: Pain level controlled  Post assessment: Post-op Vital signs reviewed, Patient's Cardiovascular Status Stable, Respiratory Function Stable, Patent Airway and No signs of Nausea or vomiting  Post vital signs: Reviewed and stable  Last Vitals:  Filed Vitals:   01/15/15 0910  BP: 108/48  Pulse: 91  Temp: 36.3 C  Resp: 18    Level of consciousness: awake, alert  and patient cooperative  Complications: No apparent anesthesia complications

## 2015-01-15 NOTE — Progress Notes (Signed)
PROGRESS NOTE  PATIENT NAME: Jenna Marshall DOB: Oct 07, 1925  MRN: [id number]  POD # 1: Left hip hemiarthroplasty for a femoral neck fracture  Subjective: Patient reports pain to be well controlled.   She denies any nausea, chest pain, or shortness of breath  Objective: Vital signs in last 24 hours: Temp:  [96.5 F (35.8 C)-98.2 F (36.8 C)] 97.3 F (36.3 C) (08/14 0910) Pulse Rate:  [57-95] 91 (08/14 0910) Resp:  [13-18] 18 (08/14 0910) BP: (89-127)/(48-91) 108/48 mmHg (08/14 0910) SpO2:  [90 %-100 %] 94 % (08/14 0910) FiO2 (%):  [28 %] 28 % (08/13 1449)  Intake/Output from previous day: 08/13 0701 - 08/14 0700 In: 3250 [I.V.:2900; IV Piggyback:350] Out: 1235 [Urine:905; Drains:30; Blood:300]   Recent Labs  01/13/15 1619 01/14/15 0429 01/15/15 0436  WBC 14.2* 11.6* 12.3*  HGB 11.2* 10.9* 8.8*  HCT 33.9* 32.3* 26.1*  PLT 131* 119* 91*  K 3.9 4.4 4.3  CL 105 106 105  CO2 23 28 28   BUN 25* 21* 20  CREATININE 0.93 0.87 0.87  GLUCOSE 106* 120* 129*  CALCIUM 8.8* 8.5* 8.3*    EXAM General - Well-developed well-nourished female seen in no acute distress. Lungs - clear to auscultation bilaterally. Cardiac - Regular rate and rhythm with normal S1 and S2. No appreciable murmurs, gallops, or rubs. Extremity - No significant swelling or ecchymosis to the left thigh. Dressing is dry and intact. Homans test is negative. Neurologic - Sensory motor function is grossly intact.  Assessment: Left hip hemiarthroplasty for a femoral neck fracture  Active Problems:   Closed left hip fracture   Plan: Physical therapy is to in today. Plan is to go Skilled nursing facility after hospital stay. DVT Prophylaxis - Lovenox, Foot Pumps and TED hose Weight-Bearing as tolerated to the left leg Repeat labs in the morning.  Tamekia Rotter P. Holley Bouche M.D.

## 2015-01-15 NOTE — Progress Notes (Signed)
PT Cancellation Note  Patient Details Name: Jenna Marshall MRN: [id number] DOB: 1926/03/21   Cancelled Treatment:    Reason Eval/Treat Not Completed: Other (comment) (nursing asked PT to hold as pt is anxious and just now getting pain meds) - will try later today  Caryl Pina C. Moriah Loughry, PT, DPT [phone]  [name] 01/15/2015, 10:51 AM

## 2015-01-16 ENCOUNTER — Other Ambulatory Visit: Payer: Self-pay | Admitting: *Deleted

## 2015-01-16 ENCOUNTER — Encounter: Payer: Self-pay | Admitting: Orthopedic Surgery

## 2015-01-16 ENCOUNTER — Inpatient Hospital Stay: Payer: Commercial Managed Care - HMO

## 2015-01-16 DIAGNOSIS — Z09 Encounter for follow-up examination after completed treatment for conditions other than malignant neoplasm: Secondary | ICD-10-CM

## 2015-01-16 LAB — BASIC METABOLIC PANEL
Anion gap: 4 — ABNORMAL LOW (ref 5–15)
BUN: 22 mg/dL — AB (ref 6–20)
CALCIUM: 7.9 mg/dL — AB (ref 8.9–10.3)
CO2: 26 mmol/L (ref 22–32)
Chloride: 107 mmol/L (ref 101–111)
Creatinine, Ser: 0.77 mg/dL (ref 0.44–1.00)
GFR calc Af Amer: >60 mL/min (ref >60)
GLUCOSE: 151 mg/dL — AB (ref 65–99)
Potassium: 4.3 mmol/L (ref 3.5–5.1)
SODIUM: 137 mmol/L (ref 135–145)

## 2015-01-16 LAB — CBC
HCT: 26.1 % — ABNORMAL LOW (ref 35.0–47.0)
Hemoglobin: 8.5 g/dL — ABNORMAL LOW (ref 12.0–16.0)
MCH: 31.9 pg (ref 26.0–34.0)
MCHC: 32.7 g/dL (ref 32.0–36.0)
MCV: 97.4 fL (ref 80.0–100.0)
PLATELETS: 97 10*3/uL — AB (ref 150–440)
RBC: 2.68 MIL/uL — ABNORMAL LOW (ref 3.80–5.20)
RDW: 15.2 % — AB (ref 11.5–14.5)
WBC: 11.1 10*3/uL — AB (ref 3.6–11.0)

## 2015-01-16 MED ORDER — FUROSEMIDE 10 MG/ML IJ SOLN
40.0000 mg | Freq: Two times a day (BID) | INTRAMUSCULAR | Status: DC
  Administered 2015-01-17: 40 mg via INTRAVENOUS
  Filled 2015-01-16: qty 4

## 2015-01-16 MED ORDER — ENSURE ENLIVE PO LIQD
237.0000 mL | Freq: Two times a day (BID) | ORAL | Status: DC
  Administered 2015-01-16 – 2015-01-19 (×5): 237 mL via ORAL

## 2015-01-16 MED ORDER — FUROSEMIDE 40 MG PO TABS: 40.0000 mg | ORAL_TABLET | Freq: Two times a day (BID) | ORAL | Status: DC

## 2015-01-16 MED ORDER — FUROSEMIDE 40 MG PO TABS
40.0000 mg | ORAL_TABLET | Freq: Once | ORAL | Status: AC
  Administered 2015-01-16: 40 mg via ORAL
  Filled 2015-01-16: qty 1

## 2015-01-16 MED ORDER — DIPHENHYDRAMINE HCL 25 MG PO CAPS: 25.0000 mg | ORAL_CAPSULE | Freq: Once | ORAL | Status: DC

## 2015-01-16 NOTE — Addendum Note (Signed)
Addended by: Gurney Maxin on: 01/16/2015 12:57 PM   Modules accepted: Orders

## 2015-01-16 NOTE — Patient Outreach (Signed)
[facility]) Care Management  01/16/2015  Jenna Marshall [birth year] [id number]   Notification from [place], RN patient will discharge from [facility] to SNF, [place], LCSW assigned to follow patient at [facility].  Thanks, [name]. [place], [place] Assistant Phone: [phone] Fax: [phone]

## 2015-01-16 NOTE — Progress Notes (Signed)
Subjective: 2 Days Post-Op Procedure(s) (LRB): ARTHROPLASTY BIPOLAR HIP (HEMIARTHROPLASTY) (Left) Patient reports pain as mild.   Patient is well, and has had no acute complaints or problems Plan is to go Skilled nursing facility after hospital stay. Negative for chest pain and shortness of breath Fever: no Gastrointestinal:Positive for nausea last night.  Objective: Vital signs in last 24 hours: Temp:  [98.2 F (36.8 C)-98.9 F (37.2 C)] 98.2 F (36.8 C) (08/15 0841) Pulse Rate:  [88-94] 88 (08/15 0841) Resp:  [16-18] 18 (08/15 0841) BP: (96-140)/(42-80) 140/68 mmHg (08/15 0841) SpO2:  [90 %-95 %] 94 % (08/15 1206)  Intake/Output from previous day:  Intake/Output Summary (Last 24 hours) at 01/16/15 1248 Last data filed at 01/16/15 0620  Gross per 24 hour  Intake   1100 ml  Output    140 ml  Net    960 ml    Intake/Output this shift:    Labs:  Recent Labs  01/13/15 1619 01/14/15 0429 01/15/15 0436 01/15/15 1629 01/16/15 0433  HGB 11.2* 10.9* 8.8* 8.6* 8.5*    Recent Labs  01/15/15 0436 01/16/15 0433  WBC 12.3* 11.1*  RBC 2.74* 2.68*  HCT 26.1* 26.1*  PLT 91* 97*    Recent Labs  01/15/15 0436 01/16/15 0433  NA 137 137  K 4.3 4.3  CL 105 107  CO2 28 26  BUN 20 22*  CREATININE 0.87 0.77  GLUCOSE 129* 151*  CALCIUM 8.3* 7.9*   No results for input(s): LABPT, INR in the last 72 hours.   EXAM General - Patient is Alert and Appropriate Extremity - Neurologically intact ABD soft Intact pulses distally Dorsiflexion/Plantar flexion intact Incision: dressing C/D/I No cellulitis present Dressing/Incision - clean, dry, no drainage Motor Function - intact, moving foot and toes well on exam.   Abdomen appears slight distended.  Normal BS without tympany.  Pt has not had a BM but is passing gas.  Past Medical History  Diagnosis Date  . Hyperlipemia   . CAD (coronary artery disease)     a. Lexiscan 10/13/14: mid anterior to apical & inf wall  ischemia w/ WMA, mild to mod dep EF; b. cardiac cath 10/14/2014: ost LM to LM 50%, LM 99%, ost LAD 95%, ost LAD 95%, prox LAD to mid LAD 40%, prox RCA 30%, mid RCA 70%, Critical left main stenosis. Significant distal RCA stenosis. Heavily calcified arteries. Normal EF by echo with no significant AS or MR. Recommend urgent CABG. cCABG x 4 (5/201  . GERD (gastroesophageal reflux disease)   . Macular degeneration   . Hyperlipidemia   . Hypertension   . Hypothyroidism   . Depression   . pernicious anemia   . Varicose veins   . CAD (coronary artery disease)     a. Lexi 10/13/14: mid ant to apical & inf wall ischemia w/ WMA, mild-mod dep EF; b. cath 10/14/14: ost LM to LM 50%, dLM 99%, ost LAD 95%, ost LAD 95%, pLAD-LAD 40%, pRCA 30%, mRCA 70%, Critical LM stenosis. Sig dRCA stenosis. Heavily calcified arteries; c. CABG x 4 (10/2014)  . Diastolic dysfunction     a. echo 10/2014: EF 55-60%, no RWMA, GR1DD, mild AI, trivial MR, mildly dilated LA, PASP normal    Assessment/Plan: 2 Days Post-Op Procedure(s) (LRB): ARTHROPLASTY BIPOLAR HIP (HEMIARTHROPLASTY) (Left) Active Problems:   Closed left hip fracture  Estimated body mass index is 30.71 kg/(m^2) as calculated from the following:   Height as of this encounter: 5\' 4"  (1.626 m).   Weight  as of this encounter: 81.194 kg (179 lb). Advance diet Up with therapy   Pt unable to participate with PT this morning due to fatigue, PT will re-attempt later today. Pt has not had a BM yet, is passing gas. Hemovac removed today with no issues.  Foley d/c on POD1. Pt on 3L of O2 and at 94%, low O2 levels believed to be cause of earlier fatigue. Labs reviewed  DVT Prophylaxis - Lovenox, Foot Pumps and TED hose Weight-Bearing as tolerated to left leg  J. Cameron Proud, PA-C [facility] 01/16/2015, 12:48 PM

## 2015-01-16 NOTE — Clinical Social Work Placement (Signed)
   CLINICAL SOCIAL WORK PLACEMENT  NOTE  Date:  01/16/2015  Patient Details  Name: [name] MRN: [id number] Date of Birth: Jun 09, 1925  Clinical Social Work is seeking post-discharge placement for this patient at the [place] level of care (*CSW will initial, date and re-position this form in  chart as items are completed):  Yes   Patient/family provided with [place] Work Department's list of facilities offering this level of care within the geographic area requested by the patient (or if unable, by the patient's family).  Yes   Patient/family informed of their freedom to choose among providers that offer the needed level of care, that participate in Medicare, Medicaid or managed care program needed by the patient, have an available bed and are willing to accept the patient.  Yes   Patient/family informed of [facility]'s ownership interest in [facility] and [facility], as well as of the fact that they are under no obligation to receive care at these facilities.  PASRR submitted to EDS on       PASRR number received on       Existing PASRR number confirmed on 01/15/15     FL2 transmitted to all facilities in geographic area requested by pt/family on 01/15/15     FL2 transmitted to all facilities within larger geographic area on       Patient informed that his/her managed care company has contracts with or will negotiate with certain facilities, including the following:        Yes   Patient/family informed of bed offers received.  Patient chooses bed at  [facility])     Physician recommends and patient chooses bed at      Patient to be transferred to  [facility]) on  .  Patient to be transferred to facility by       Patient family notified on   of transfer.  Name of family member notified:        PHYSICIAN Please sign FL2     Additional Comment:    _______________________________________________ Alonna Buckler, LCSW 01/16/2015, 3:34 PM

## 2015-01-16 NOTE — Progress Notes (Signed)
[facility] [place] NAME: Jenna Marshall    MR#:  [id number]  DATE OF BIRTH:  Jun 29, 1925  SUBJECTIVE:  CHIEF COMPLAINT:   Chief Complaint  Patient presents with  . Hip Pain   No complaint.  Status post Left hip hemiarthroplasty POD-2  REVIEW OF SYSTEMS:  CONSTITUTIONAL: No fever, fatigue or weakness.  EYES: No blurred or double vision.  EARS, NOSE, AND THROAT: No tinnitus or ear pain.  RESPIRATORY: No cough, shortness of breath, wheezing or hemoptysis.  CARDIOVASCULAR: No chest pain, orthopnea, edema.  GASTROINTESTINAL: No nausea, vomiting, diarrhea or abdominal pain.  GENITOURINARY: No dysuria, hematuria.  ENDOCRINE: No polyuria, nocturia,  HEMATOLOGY: No anemia, easy bruising or bleeding SKIN: No rash or lesion. MUSCULOSKELETAL: Right hip pain NEUROLOGIC: No tingling, numbness, weakness.  PSYCHIATRY: No anxiety or depression.   DRUG ALLERGIES:   Allergies  Allergen Reactions  . Ambien [Zolpidem] Other (See Comments)    Reaction:  Hallucinations  . Codeine Nausea And Vomiting    VITALS:  Blood pressure 140/68, pulse 88, temperature 98.2 F (36.8 C), temperature source Oral, resp. rate 18, height 5\' 4"  (1.626 m), weight 81.194 kg (179 lb), SpO2 91 %.  PHYSICAL EXAMINATION:  GENERAL:  81 y.o.-year-old patient lying in the bed with no acute distress.  EYES: Pupils equal, round, reactive to light and accommodation. No scleral icterus. Extraocular muscles intact.  HEENT: Head atraumatic, normocephalic. Oropharynx and nasopharynx clear.  NECK:  Supple, no jugular venous distention. No thyroid enlargement, no tenderness.  LUNGS: Normal breath sounds bilaterally, no wheezing, rales,rhonchi or crepitation. No use of accessory muscles of respiration.  CARDIOVASCULAR: S1, S2 normal. No murmurs, rubs, or gallops.  ABDOMEN: Soft, nontender, nondistended. Bowel sounds present. No organomegaly or mass.  EXTREMITIES: No pedal edema,  cyanosis, or clubbing. Bloody drainage in the bag from left hip wound. NEUROLOGIC: Cranial nerves II through XII are intact. Muscle strength 5/5 in all extremities except the left lower extremity. Sensation intact. Gait not checked.  PSYCHIATRIC: The patient is alert and awake. SKIN: No obvious rash, lesion, or ulcer.    LABORATORY PANEL:   CBC  Recent Labs Lab 01/16/15 0433  WBC 11.1*  HGB 8.5*  HCT 26.1*  PLT 97*   ------------------------------------------------------------------------------------------------------------------  Chemistries   Recent Labs Lab 01/16/15 0433  NA 137  K 4.3  CL 107  CO2 26  GLUCOSE 151*  BUN 22*  CREATININE 0.77  CALCIUM 7.9*   ------------------------------------------------------------------------------------------------------------------  Cardiac Enzymes No results for input(s): TROPONINI in the last 168 hours. ------------------------------------------------------------------------------------------------------------------  RADIOLOGY:  Dg Hip Port Unilat With Pelvis 1v Left  01/14/2015   CLINICAL DATA:  Status post left hip arthroplasty to treat subcapital hip fracture.  EXAM: DG HIP (WITH OR WITHOUT PELVIS) 1V PORT LEFT  COMPARISON:  01/13/2015  FINDINGS: Alignment of a bipolar left hip arthroplasty appears normal. No evidence of fracture or abnormal lucency surrounding hardware.  IMPRESSION: Normal alignment following left hip arthroplasty   Electronically Signed   By: Aletta Edouard M.D.   On: 01/14/2015 14:03    EKG:   Orders placed or performed during the hospital encounter of 01/13/15  . ED EKG  . ED EKG  . EKG 12-Lead  . EKG 12-Lead    ASSESSMENT AND PLAN:   * Left femoral neck fracture. Status post Left hip hemiarthroplasty POD-2 Encourage deep deep breaths DVT prophylaxis per Dr. Marry Guan, follow-up CBC.  * Acute blood loss anemia s/p surgery Monitor. No  transfusion at this time unless < 7. Repeat in AM  *  History of coronary artery disease- continue low-dose Coreg. ASA.  * Hyperlipidemia,  continue atorvastatin.  * Hypothyroidism,  continue levothyroxine.   All the records are reviewed and case discussed with Care Management/Social Workerr. Management plans discussed with the patient, family and they are in agreement.  CODE STATUS: DO NOT RESUSCITATE  TOTAL TIME TAKING CARE OF THIS PATIENT: 35 minutes.   POSSIBLE D/C IN 3 DAYS, DEPENDING ON CLINICAL CONDITION.   Hillary Bow R M.D on 01/16/2015 at 11:33 AM  Between 7am to 6pm - Pager - [phone]  After 6pm go to [email or web address] - password [email or web address]  Office  [phone]  CC: Primary care physician; [name], MD

## 2015-01-16 NOTE — Patient Outreach (Signed)
RNCM received a phone call from pt's daughter Ciro Backer. Cathy informed RNCM pt had fallen last week and had broken her hip. Daughter informed RNCM pt had surgery to repair her hip and would be discharging to a SNF soon. RNCM made daughter aware that [facility]-SW will visit pt in the SNF and [facility] RNCM will continue to follow pt when d/c'd from the SNF. Daughter stated pt was in fairly good spirits but was frustrated about having to go to SNF instead of home. Daughter stated her mom understood the reasoning but was still wishing she could go home.    Plan: RNCM will make SW aware of pt being admitted to hosp and plans to d/c to SNF.   Rutherford Limerick RN, BSN  [facility] Care Management [phone])

## 2015-01-16 NOTE — Progress Notes (Signed)
PT Cancellation Note  Patient Details Name: Jenna Marshall MRN: [id number] DOB: 20-May-1926   Cancelled Treatment:    Reason Eval/Treat Not Completed: Fatigue/lethargy limiting ability to participate;Patient at procedure or test/unavailable. Patient continues to be very lethargic and family has been concerned, discussed with RN who stated patient will be having a chest x-ray completed shortly. PT will hold further mobility until patient is completed with her chest x-ray.   Kerman Passey, PT, DPT    01/16/2015, 2:22 PM

## 2015-01-16 NOTE — Progress Notes (Signed)
Patient very lethargic and not her normal self today per several family members.  Patient states that she feels very tired and sleepy.  Increased Oxygen level to 3 liters.  Patient had chest xray that revealed fluid overload.  Added Lasix to patient MAR.  Dr. Darvin Neighbours explained to family the chest xray results.

## 2015-01-16 NOTE — Progress Notes (Signed)
Pt's family was presented bed offers and has selected [facility] at [employer].  CSW will continue to follow to facilitate dc.   Toma Copier, [place]

## 2015-01-16 NOTE — Progress Notes (Signed)
Physical Therapy Treatment Patient Details Name: Jenna Marshall MRN: [id number] DOB: [birth year] Today's Date: 01/16/2015    History of Present Illness Jenna Marshall is a 81 y.o. female with a known history of coronary artery disease with CABG 3 months ago. She presents after hitting her toe on a chair and a fall. In the ER, she was found to have a left femoral neck fracture. pt underwent L hip ORIF 01/14/15. WBAT    PT Comments    Patient is very lethargic today and is unable to provide full participation in PT. She initially is at 88% saturation of O2 on 2L, however after sitting at the EOB ~ 5-8 minutes she was down to 80%. Patient is oriented to time, place, and situation however she is unable to assist with transfers or provide sufficient effort with PT today to attempt any OOB mobility safely. RN alerted regarding patient's altered mental status and low oxygen. Patient will continue to benefit from skilled PT and will benefit from short term rehabilitation.   Follow Up Recommendations  SNF     Equipment Recommendations       Recommendations for Other Services       Precautions / Restrictions Precautions Precautions: Fall Restrictions Weight Bearing Restrictions: Yes LLE Weight Bearing: Weight bearing as tolerated    Mobility  Bed Mobility Overal bed mobility: Needs Assistance;+2 for physical assistance Bed Mobility: Supine to Sit;Sit to Supine     Supine to sit: Max assist;HOB elevated Sit to supine: +2 for physical assistance;Max assist   General bed mobility comments: Patient is very lethargic and weak, and is unable to actively participate in any transfer at this time. After sitting at the edge of the bed ~ 5-8 minutes on 2L of O2 her O2 sats dropped to 80%. Recovered in supine with 3L of O2.   Transfers                 General transfer comment: Unable to participate secondary to lethargy.   Ambulation/Gait                 Stairs             Wheelchair Mobility    Modified Rankin (Stroke Patients Only)       Balance Overall balance assessment: Needs assistance Sitting-balance support: Bilateral upper extremity supported Sitting balance-Leahy Scale: Poor Sitting balance - Comments: Patient leans posteriorly and to her right and requires cuing and min A x1 initially to bring "her nose over her toes", after which she was able to sit without external assistance.  Postural control: Posterior lean;Right lateral lean                          Cognition Arousal/Alertness: Lethargic Behavior During Therapy:  (Oriented but very lethargic.) Overall Cognitive Status:  (Oriented to person, place, and time. Too lethargic to provide much participation.)                      Exercises Total Joint Exercises Ankle Circles/Pumps: AROM;15 reps;Both Gluteal Sets: Strengthening;Both;10 reps Heel Slides: AROM;AAROM;Both;10 reps Hip ABduction/ADduction: AAROM;10 reps;AROM;Both Straight Leg Raises: AROM;Right;10 reps    General Comments        Pertinent Vitals/Pain Pain Assessment:  (Patient too lethargic to describe pain today, but does not appear to grimace in pain.)    Home Living  Prior Function            PT Goals (current goals can now be found in the care plan section) Acute Rehab PT Goals Patient Stated Goal: to walk PT Goal Formulation: With patient Time For Goal Achievement: 01/29/15 Potential to Achieve Goals: Fair Progress towards PT goals: Not progressing toward goals - comment (Patient too lethargic to really participate in PT. )    Frequency  BID    PT Plan Current plan remains appropriate    Co-evaluation             End of Session Equipment Utilized During Treatment: Oxygen;Gait belt Activity Tolerance: Patient limited by lethargy Patient left: in bed;with call bell/phone within reach;with bed alarm set;with SCD's reapplied;with family/visitor  present     Time: 9753-0051 PT Time Calculation (min) (ACUTE ONLY): 39 min  Charges:  $Therapeutic Exercise: 8-22 mins $Therapeutic Activity: 23-37 mins                    G Codes:      Kerman Passey, PT, DPT    01/16/2015, 12:59 PM

## 2015-01-16 NOTE — Progress Notes (Signed)
Physical Therapy Treatment Patient Details Name: Jenna Marshall MRN: [id number] DOB: 1926-04-05 Today's Date: 01/16/2015    History of Present Illness Jenna Marshall is a 81 y.o. female with a known history of coronary artery disease with CABG 3 months ago. She presents after hitting her toe on a chair and a fall. In the ER, she was found to have a left femoral neck fracture. pt underwent L hip ORIF 01/14/15. WBAT    PT Comments    Patient continues to display significant lethargy and minimal participation throughout this session. She is unable to perform full AROM in supine exercises even with her RLE due to lethargy. Patient is unable to tolerate sitting for more than 3 minutes with significant assistance from PT to maintain herself upright. Patient continues to display decreased O2 saturation in sitting even on 3L. PT will continue to attempt to progress bed and out of bed mobility as appropriate and tolerated.   Follow Up Recommendations  SNF     Equipment Recommendations       Recommendations for Other Services       Precautions / Restrictions Precautions Precautions: Fall;Posterior Hip (Left posterior hip) Restrictions Weight Bearing Restrictions: Yes LLE Weight Bearing: Weight bearing as tolerated    Mobility  Bed Mobility Overal bed mobility: Needs Assistance Bed Mobility: Supine to Sit;Sit to Supine     Supine to sit: Max assist;HOB elevated Sit to supine: Max assist   General bed mobility comments: Patient participates minimally in supine <--> transfer secondary to weakness and lethargy. Patient fatigues after sitting ~ 3 minutes.   Transfers                 General transfer comment: Unable to participate secondary to lethargy.   Ambulation/Gait             General Gait Details: not appropriate today   Stairs            Wheelchair Mobility    Modified Rankin (Stroke Patients Only)       Balance Overall balance assessment: Needs  assistance Sitting-balance support: Bilateral upper extremity supported;Feet supported Sitting balance-Leahy Scale: Poor Sitting balance - Comments: Patient leans posteriorly and to her right and requires cuing and min A x1 initially to bring "her nose over her toes". Unlike in the morning session, patient was never able to sit independently this afternoon secondary to weakness and lethargy.  Postural control: Right lateral lean;Posterior lean                          Cognition Arousal/Alertness: Lethargic Behavior During Therapy:  (Patient is able to state her needs "I want to lay back down" but minimally participates verbally in session. ) Overall Cognitive Status:  (Oriented to person, place, and time. Too lethargic to provide much participation.)                      Exercises Total Joint Exercises Ankle Circles/Pumps: AROM;15 reps;Both Gluteal Sets: Strengthening;Both;10 reps Heel Slides: AROM;AAROM;Both;10 reps Hip ABduction/ADduction: AAROM;10 reps;AROM;Both Straight Leg Raises: AAROM;Right;10 reps Long Arc Quad: AAROM;Both;10 reps    General Comments        Pertinent Vitals/Pain Pain Assessment:  (Patient again too lethargic to provide any painful data. )    Home Living                      Prior Function  PT Goals (current goals can now be found in the care plan section) Acute Rehab PT Goals Patient Stated Goal: to walk PT Goal Formulation: With patient Time For Goal Achievement: 01/29/15 Potential to Achieve Goals: Fair Progress towards PT goals: Not progressing toward goals - comment (Patient again unable to tolerate sitting for any length of time prior to fatiguing and needing to sit. )    Frequency  BID    PT Plan Current plan remains appropriate (Patient may be more appropriate for QD status if she does not participate more.)    Co-evaluation             End of Session Equipment Utilized During Treatment:  Oxygen;Gait belt Activity Tolerance: Patient limited by lethargy Patient left: in bed;with call bell/phone within reach;with bed alarm set;with SCD's reapplied;with family/visitor present     Time: 6168-3729 PT Time Calculation (min) (ACUTE ONLY): 18 min  Charges:  $Therapeutic Exercise: 8-22 mins $Therapeutic Activity: 23-37 mins                     G Codes:      Kerman Passey, PT, DPT    01/16/2015, 4:29 PM

## 2015-01-16 NOTE — Progress Notes (Signed)
OT Cancellation Note  Patient Details Name: Jenna Marshall MRN: [id number] DOB: [birth year]   Cancelled Treatment:      Reason Eval/Treat Not Completed: Fatigue/lethargy limiting ability to participate; Patient continues to be very lethargic and family has been concerned.  Plan to reassess tomorrow for OT evaluation.  [place], OTR/L ascom [phone] 01/16/2015, 4:14 PM

## 2015-01-17 ENCOUNTER — Encounter
Admission: RE | Admit: 2015-01-17 | Discharge: 2015-01-17 | Disposition: A | Discharge status: Home / Self Care | Payer: Commercial Managed Care - HMO | Source: Ambulatory Visit | Attending: Internal Medicine | Admitting: Internal Medicine

## 2015-01-17 ENCOUNTER — Inpatient Hospital Stay: Payer: Commercial Managed Care - HMO

## 2015-01-17 ENCOUNTER — Encounter: Payer: Self-pay | Admitting: Orthopedic Surgery

## 2015-01-17 DIAGNOSIS — R42 Dizziness and giddiness: Secondary | ICD-10-CM | POA: Insufficient documentation

## 2015-01-17 LAB — BASIC METABOLIC PANEL
Anion gap: 7 (ref 5–15)
BUN: 24 mg/dL — ABNORMAL HIGH (ref 6–20)
CALCIUM: 7.9 mg/dL — AB (ref 8.9–10.3)
CO2: 31 mmol/L (ref 22–32)
CREATININE: 0.82 mg/dL (ref 0.44–1.00)
Chloride: 99 mmol/L — ABNORMAL LOW (ref 101–111)
Glucose, Bld: 121 mg/dL — ABNORMAL HIGH (ref 65–99)
Potassium: 3.8 mmol/L (ref 3.5–5.1)
SODIUM: 137 mmol/L (ref 135–145)

## 2015-01-17 LAB — CBC
HCT: 25.1 % — ABNORMAL LOW (ref 35.0–47.0)
Hemoglobin: 8.4 g/dL — ABNORMAL LOW (ref 12.0–16.0)
MCH: 32 pg (ref 26.0–34.0)
MCHC: 33.7 g/dL (ref 32.0–36.0)
MCV: 94.8 fL (ref 80.0–100.0)
PLATELETS: 116 10*3/uL — AB (ref 150–440)
RBC: 2.64 MIL/uL — ABNORMAL LOW (ref 3.80–5.20)
RDW: 14.7 % — AB (ref 11.5–14.5)
WBC: 9.2 10*3/uL (ref 3.6–11.0)

## 2015-01-17 MED ORDER — OXYCODONE HCL 5 MG PO TABS: 5.0000 mg | ORAL_TABLET | ORAL | Status: DC | PRN

## 2015-01-17 MED ORDER — FUROSEMIDE 10 MG/ML IJ SOLN
40.0000 mg | Freq: Three times a day (TID) | INTRAMUSCULAR | Status: DC
  Administered 2015-01-17 – 2015-01-18 (×4): 40 mg via INTRAVENOUS
  Filled 2015-01-17 (×4): qty 4

## 2015-01-17 MED ORDER — ENOXAPARIN SODIUM 40 MG/0.4ML ~~LOC~~ SOLN: 40.0000 mg | SUBCUTANEOUS | Status: DC

## 2015-01-17 MED ORDER — TRAMADOL HCL 50 MG PO TABS: 50.0000 mg | ORAL_TABLET | ORAL | Status: DC | PRN

## 2015-01-17 MED ORDER — POTASSIUM CHLORIDE CRYS ER 20 MEQ PO TBCR
20.0000 meq | EXTENDED_RELEASE_TABLET | Freq: Every day | ORAL | Status: AC
  Administered 2015-01-17 – 2015-01-18 (×2): 20 meq via ORAL
  Filled 2015-01-17 (×2): qty 1

## 2015-01-17 NOTE — Care Management Important Message (Signed)
Important Message  Patient Details  Name: Jenna Marshall MRN: [id number] Date of Birth: Apr 29, 1926   Medicare Important Message Given:  Yes-second notification given    Marshell Garfinkel, RN 01/17/2015, 11:26 AM

## 2015-01-17 NOTE — Progress Notes (Signed)
Subjective: 3 Days Post-Op Procedure(s) (LRB): ARTHROPLASTY BIPOLAR HIP (HEMIARTHROPLASTY) (Left) Patient reports pain as mild.   Patient is well, and has had no acute complaints or problems Continue with physical therapy today.  Plan is to go Rehab after hospital stay. no nausea and no vomiting Patient denies any chest pains or shortness of breath. Objective: Vital signs in last 24 hours: Temp:  [97.4 F (36.3 C)-98.2 F (36.8 C)] 97.8 F (36.6 C) (08/16 0342) Pulse Rate:  [84-100] 84 (08/16 0342) Resp:  [16-18] 18 (08/16 0342) BP: (99-140)/(49-68) 128/49 mmHg (08/16 0342) SpO2:  [88 %-94 %] 91 % (08/16 0342) well approximated incision Heels are non tender and elevated off the bed using rolled towels Intake/Output from previous day:   Intake/Output this shift:     Recent Labs  01/15/15 0436 01/15/15 1629 01/16/15 0433 01/17/15 0505  HGB 8.8* 8.6* 8.5* 8.4*    Recent Labs  01/16/15 0433 01/17/15 0505  WBC 11.1* 9.2  RBC 2.68* 2.64*  HCT 26.1* 25.1*  PLT 97* 116*    Recent Labs  01/16/15 0433 01/17/15 0505  NA 137 137  K 4.3 3.8  CL 107 99*  CO2 26 31  BUN 22* 24*  CREATININE 0.77 0.82  GLUCOSE 151* 121*  CALCIUM 7.9* 7.9*   No results for input(s): LABPT, INR in the last 72 hours.  EXAM General - Patient is Alert, Appropriate and Oriented Extremity - Neurologically intact Neurovascular intact Sensation intact distally Intact pulses distally Dorsiflexion/Plantar flexion intact Dressing - moderate drainage Motor Function - intact, moving foot and toes well on exam. Patient sequential compression not on secondary to increased pain. Complains of pain diffusely take anywhere you touch on the lower extremity. Complains of pain testing strength of the lower extremity.  Past Medical History  Diagnosis Date  . Hyperlipemia   . CAD (coronary artery disease)     a. Lexiscan 10/13/14: mid anterior to apical & inf wall ischemia w/ WMA, mild to mod dep  EF; b. cardiac cath 10/14/2014: ost LM to LM 50%, LM 99%, ost LAD 95%, ost LAD 95%, prox LAD to mid LAD 40%, prox RCA 30%, mid RCA 70%, Critical left main stenosis. Significant distal RCA stenosis. Heavily calcified arteries. Normal EF by echo with no significant AS or MR. Recommend urgent CABG. cCABG x 4 (5/201  . GERD (gastroesophageal reflux disease)   . Macular degeneration   . Hyperlipidemia   . Hypertension   . Hypothyroidism   . Depression   . pernicious anemia   . Varicose veins   . CAD (coronary artery disease)     a. Lexi 10/13/14: mid ant to apical & inf wall ischemia w/ WMA, mild-mod dep EF; b. cath 10/14/14: ost LM to LM 50%, dLM 99%, ost LAD 95%, ost LAD 95%, pLAD-LAD 40%, pRCA 30%, mRCA 70%, Critical LM stenosis. Sig dRCA stenosis. Heavily calcified arteries; c. CABG x 4 (10/2014)  . Diastolic dysfunction     a. echo 10/2014: EF 55-60%, no RWMA, GR1DD, mild AI, trivial MR, mildly dilated LA, PASP normal    Assessment/Plan: 3 Days Post-Op Procedure(s) (LRB): ARTHROPLASTY BIPOLAR HIP (HEMIARTHROPLASTY) (Left) Active Problems:   Closed left hip fracture  Estimated body mass index is 30.71 kg/(m^2) as calculated from the following:   Height as of this encounter: 5\' 4"  (1.626 m).   Weight as of this encounter: 81.194 kg (179 lb). Up with therapy Discharge to SNF when medically cleared.  Labs: Were reviewed DVT Prophylaxis - Lovenox,  TED hose and Sequential compression Weight-Bearing as tolerated to left leg Patient is to have a bowel movement prior to being discharged Staples will need to be removed 2 weeks postop and apply benzoin and half-inch Steri-Strips Patient will need follow-up in [facility] in 6 weeks. D/C O2 and Pulse OX and try on Room [employer] R. [place] Lambert 01/17/2015, 7:54 AM

## 2015-01-17 NOTE — Progress Notes (Signed)
[facility] [place] NAME: Jenna Marshall    MR#:  [id number]  DATE OF BIRTH:  [birth year]  SUBJECTIVE:  CHIEF COMPLAINT:   Chief Complaint  Patient presents with  . Hip Pain   No complaint.  Status post Left hip hemiarthroplasty POD-3 Less tired. Dry cough. Had mild SOB yesterday that is improved  REVIEW OF SYSTEMS:  CONSTITUTIONAL: No fever, fatigue or weakness.  EYES: No blurred or double vision.  EARS, NOSE, AND THROAT: No tinnitus or ear pain.  RESPIRATORY: No cough, shortness of breath, wheezing or hemoptysis.  CARDIOVASCULAR: No chest pain, orthopnea, edema.  GASTROINTESTINAL: No nausea, vomiting, diarrhea or abdominal pain.  GENITOURINARY: No dysuria, hematuria.  ENDOCRINE: No polyuria, nocturia,  HEMATOLOGY: No anemia, easy bruising or bleeding SKIN: No rash or lesion. MUSCULOSKELETAL: Right hip pain NEUROLOGIC: No tingling, numbness, weakness.  PSYCHIATRY: No anxiety or depression.   DRUG ALLERGIES:   Allergies  Allergen Reactions  . Ambien [Zolpidem] Other (See Comments)    Reaction:  Hallucinations  . Codeine Nausea And Vomiting    VITALS:  Blood pressure 129/48, pulse 99, temperature 98.6 F (37 C), temperature source Oral, resp. rate 18, height 5\' 4"  (1.626 m), weight 81.194 kg (179 lb), SpO2 92 %.  PHYSICAL EXAMINATION:  GENERAL:  81 y.o.-year-old patient lying in the bed with no acute distress.  EYES: Pupils equal, round, reactive to light and accommodation. No scleral icterus. Extraocular muscles intact.  HEENT: Head atraumatic, normocephalic. Oropharynx and nasopharynx clear.  NECK:  Supple, no jugular venous distention. No thyroid enlargement, no tenderness.  LUNGS: Decreased breath sounds bilateral. CARDIOVASCULAR: S1, S2 normal. No murmurs, rubs, or gallops.  ABDOMEN: Soft, nontender, nondistended. Bowel sounds present. No organomegaly or mass.  EXTREMITIES: No pedal edema, cyanosis, or clubbing.  Bloody drainage in the bag from left hip wound. NEUROLOGIC: Cranial nerves II through XII are intact. Muscle strength 5/5 in all extremities except the left lower extremity. Sensation intact. Gait not checked.  PSYCHIATRIC: The patient is alert and awake. SKIN: No obvious rash, lesion, or ulcer.    LABORATORY PANEL:   CBC  Recent Labs Lab 01/17/15 0505  WBC 9.2  HGB 8.4*  HCT 25.1*  PLT 116*   ------------------------------------------------------------------------------------------------------------------  Chemistries   Recent Labs Lab 01/17/15 0505  NA 137  K 3.8  CL 99*  CO2 31  GLUCOSE 121*  BUN 24*  CREATININE 0.82  CALCIUM 7.9*   ------------------------------------------------------------------------------------------------------------------  Cardiac Enzymes No results for input(s): TROPONINI in the last 168 hours. ------------------------------------------------------------------------------------------------------------------  RADIOLOGY:  Dg Chest 2 View  01/16/2015   CLINICAL DATA:  Fatigue, cough, hip replacement 4 days ago, history of coronary artery disease and hypertension  EXAM: CHEST  2 VIEW  COMPARISON:  01/13/2015  FINDINGS: Mild cardiac enlargement stable.  Aortic calcifications stable.  Mild vascular congestion. Mild interstitial prominence and peribronchial wall thickening. There is left lower lobe opacity new from the prior study suggesting a combination of small pleural effusion and consolidation.  IMPRESSION: Evidence of mild congestive heart failure with mild pulmonary edema.  Small left pleural effusion with underlying consolidation that could represent either atelectasis or pneumonia.   Electronically Signed   By: Skipper Cliche M.D.   On: 01/16/2015 14:57    EKG:   Orders placed or performed during the hospital encounter of 01/13/15  . ED EKG  . ED EKG  . EKG 12-Lead  . EKG 12-Lead    ASSESSMENT AND PLAN:   *  Left femoral neck  fracture. Status post Left hip hemiarthroplasty POD-3 Incentive spirometry DVT prophylaxis per Dr. Marry Guan, follow-up CBC.  * Acute blood loss anemia s/p surgery Monitor. No transfusion at this time unless < 7. Repeat in AM  * Acute on chronic diastolic chf Started on lasix yesterday and improving slowly.  * Acute respiratory failure Due to chf and atelectasis Wean O2.  * History of coronary artery disease- continue low-dose Coreg. ASA.  * Hyperlipidemia,  continue atorvastatin.  * Hypothyroidism,  continue levothyroxine.   All the records are reviewed and case discussed with Care Management/Social Workerr. Management plans discussed with the patient, family and they are in agreement.  CODE STATUS: DO NOT RESUSCITATE  TOTAL TIME TAKING CARE OF THIS PATIENT: 35 minutes.   POSSIBLE D/C TOMORROW DEPENDING ON CLINICAL CONDITION.   Hillary Bow R M.D on 01/17/2015 at 12:23 PM  Between 7am to 6pm - Pager - [phone]  After 6pm go to [email or web address] - password [email or web address] [facility]  Office  [phone]  CC: Primary care physician; [name], MD

## 2015-01-17 NOTE — Progress Notes (Signed)
Per MD patient is not medically stable for D/C today. Clinical Education officer, museum (CSW) made [employer] [facility] aware of above. CSW also contacted [place] case manger and made her aware of above. Per Isaias Sakai she will review clinicals for SNF authorization. Plan is for patient to D/C to [facility] tomorrow 01/18/15. CSW will continue to follow and assist as needed.   Blima Rich, [place] [phone]

## 2015-01-17 NOTE — Progress Notes (Signed)
Physical Therapy Treatment Patient Details Name: Jenna Marshall MRN: [id number] DOB: 1925/07/23 Today's Date: 01/17/2015    History of Present Illness Jenna Marshall is a 81 y.o. female with a known history of coronary artery disease with CABG 3 months ago. She presents after hitting her toe on a chair and a fall. In the ER, she was found to have a left femoral neck fracture. pt underwent L hip ORIF 01/14/15. WBAT    PT Comments    Pt requests use of bedside commode. Pt Max A x 2 for physical assist and completely unable to transfer to bedside commode. Pt cannot demonstrate ability to assist with stand placing patient and staff at risk at this point. Pt returned to bed difficultly after strong effort, as pt sunk below level of bed with attempt to stand despite all efforts. Able to return pt with total assist x 2. Max A x 2 for rolling for brief/gown change and personal hygiene. Pt given exercises for quad set and glut set to be performed numerous times throughout the day for carryover into functional activity. Plan to see pt this pm.  Follow Up Recommendations  SNF     Equipment Recommendations       Recommendations for Other Services       Precautions / Restrictions Restrictions Weight Bearing Restrictions: Yes LLE Weight Bearing: Weight bearing as tolerated    Mobility  Bed Mobility Overal bed mobility: Needs Assistance Bed Mobility: Supine to Sit;Sit to Supine Rolling: Max assist   Supine to sit: Max assist;HOB elevated Sit to supine: Max assist;+2 for physical assistance   General bed mobility comments:  (Pt states she is trying, but show minimal to no effort)  Transfers Overall transfer level: Needs assistance Equipment used: Rolling walker (2 wheeled) Transfers: Sit to/from Stand Sit to Stand: Max assist;+2 physical assistance         General transfer comment: Again states she is trying, but show little to no effort/ability to straighen knees/back/head for upright  posture to attain full stand.   Ambulation/Gait   Ambulation Distance (Feet):  (unable)             Stairs            Wheelchair Mobility    Modified Rankin (Stroke Patients Only)       Balance Overall balance assessment: Needs assistance Sitting-balance support: Bilateral upper extremity supported Sitting balance-Leahy Scale: Fair Sitting balance - Comments: Initially leaning L, but once assisted upright, pt able to maintain   Standing balance support: Bilateral upper extremity supported Standing balance-Leahy Scale: Zero Standing balance comment: unable to attain full upright posture; looks as if in partial fetal position in stand despite cues at head back and LEs and physical assist at knees                    Cognition Arousal/Alertness: Awake/alert Behavior During Therapy: WFL for tasks assessed/performed Overall Cognitive Status: Within Functional Limits for tasks assessed                      Exercises Total Joint Exercises Ankle Circles/Pumps: AROM;Both;20 reps;Supine Quad Sets: Strengthening;Both;10 reps;Supine Gluteal Sets: Strengthening;Both;10 reps;Supine    General Comments        Pertinent Vitals/Pain      Home Living                      Prior Function  PT Goals (current goals can now be found in the care plan section) Progress towards PT goals: Progressing toward goals (very slowly)    Frequency  BID    PT Plan Current plan remains appropriate    Co-evaluation             End of Session Equipment Utilized During Treatment: Gait belt Activity Tolerance: Patient limited by fatigue (limited by weakness) Patient left: in bed;with call bell/phone within reach;with bed alarm set;with nursing/sitter in room     Time: 0413-6438 PT Time Calculation (min) (ACUTE ONLY): 29 min  Charges:  $Therapeutic Exercise: 8-22 mins $Therapeutic Activity: 8-22 mins                    G Codes:       Jenna Marshall 01/17/2015, 12:06 PM

## 2015-01-17 NOTE — Progress Notes (Signed)
Physical Therapy Treatment Patient Details Name: Jaysa Kise MRN: [id number] DOB: [birth year] Today's Date: 01/17/2015    History of Present Illness Analiyah Lechuga is a 81 y.o. female with a known history of coronary artery disease with CABG 3 months ago. She presents after hitting her toe on a chair and a fall. In the ER, she was found to have a left femoral neck fracture. pt underwent L hip ORIF 01/14/15. WBAT    PT Comments    Spoke with nursing prior to treatment who notes they were able to get patient to the chair. States patient in comfortably resting in chair. Nursing notes they would prefer patient to stay up in the chair this p.m. Pt sleeping, but when awoken agreeable to exercises. Pt remains lethargic throughout session and requires increased instruction for exercises this pm. Continue PT for progression of strength to allow for improved safe functional mobility.   Follow Up Recommendations  SNF     Equipment Recommendations       Recommendations for Other Services       Precautions / Restrictions Restrictions Weight Bearing Restrictions: Yes LLE Weight Bearing: Weight bearing as tolerated    Mobility  Bed Mobility               General bed mobility comments: Pt up in chair napping   Transfers                 General transfer comment: Spoke with nurse prior to session; nurse notes pt is to stay up in the chair for several hours this pm  Ambulation/Gait                 Stairs            Wheelchair Mobility    Modified Rankin (Stroke Patients Only)       Balance                                    Cognition Arousal/Alertness: Lethargic Behavior During Therapy: WFL for tasks assessed/performed Overall Cognitive Status: Within Functional Limits for tasks assessed                      Exercises Total Joint Exercises Ankle Circles/Pumps: AROM;Both;20 reps;Seated Quad Sets: Strengthening;Both;20 reps;Seated  (more difficulty grasping concept this pm) Gluteal Sets: Strengthening;Both;20 reps;Seated Towel Squeeze: Strengthening;Both;20 reps;Seated Short Arc Quad: AAROM;Both;20 reps;Seated (with pillow roll) Heel Slides: AAROM;Both;20 reps;Seated Hip ABduction/ADduction: AAROM;Both;20 reps;Seated Knee Flexion: AROM;Both;20 reps;Seated (over pillow roll/tolerates resist on R )    General Comments        Pertinent Vitals/Pain Pain Assessment: 0-10 Pain Score: 4  Pain Location: L hip/LE    Home Living                      Prior Function            PT Goals (current goals can now be found in the care plan section) Progress towards PT goals: Progressing toward goals (slowly)    Frequency  BID    PT Plan Current plan remains appropriate    Co-evaluation             End of Session   Activity Tolerance: Patient limited by lethargy Patient left: in chair;with call bell/phone within reach (no chair alam; nursing aware)     Time: 0350-0938 PT Time Calculation (min) (ACUTE ONLY):  24 min  Charges:  $Therapeutic Exercise: 23-37 mins                    G Codes:      Charlaine Dalton 01/17/2015, 3:00 PM

## 2015-01-18 LAB — BASIC METABOLIC PANEL
Anion gap: 5 (ref 5–15)
BUN: 28 mg/dL — AB (ref 6–20)
CHLORIDE: 94 mmol/L — AB (ref 101–111)
CO2: 41 mmol/L — ABNORMAL HIGH (ref 22–32)
Calcium: 8.1 mg/dL — ABNORMAL LOW (ref 8.9–10.3)
Creatinine, Ser: 0.82 mg/dL (ref 0.44–1.00)
GFR calc Af Amer: >60 mL/min (ref >60)
GLUCOSE: 123 mg/dL — AB (ref 65–99)
POTASSIUM: 3.9 mmol/L (ref 3.5–5.1)
Sodium: 140 mmol/L (ref 135–145)

## 2015-01-18 LAB — SURGICAL PATHOLOGY

## 2015-01-18 LAB — HEMOGLOBIN: Hemoglobin: 8.8 g/dL — ABNORMAL LOW (ref 12.0–16.0)

## 2015-01-18 MED ORDER — ASPIRIN 81 MG PO CHEW
81.0000 mg | CHEWABLE_TABLET | Freq: Every day | ORAL | Status: DC
  Administered 2015-01-18 – 2015-01-19 (×2): 81 mg via ORAL
  Filled 2015-01-18 (×2): qty 1

## 2015-01-18 MED ORDER — FUROSEMIDE 40 MG PO TABS
40.0000 mg | ORAL_TABLET | Freq: Every day | ORAL | Status: DC
Start: 2015-01-18 — End: 2015-01-19
  Administered 2015-01-18 – 2015-01-19 (×2): 40 mg via ORAL
  Filled 2015-01-18 (×2): qty 1

## 2015-01-18 NOTE — Progress Notes (Signed)
[facility] [place] NAME: Jenna Marshall    MR#:  [id number]  DATE OF BIRTH:  05-22-1926  SUBJECTIVE:  CHIEF COMPLAINT:   Chief Complaint  Patient presents with  . Hip Pain  Status post Left hip hemiarthroplasty, POD-4 Daughter is at bedside. Patient appears to be more lethargic this morning, received and oxycodone for pain around 7:00 this morning. -Alert during my conversation but the at the end of the exam she fell asleep. Also appears to be slightly depressed and worried about her recovery.  REVIEW OF SYSTEMS:  CONSTITUTIONAL: No fever, fatigue or weakness.  EYES: No blurred or double vision.  EARS, NOSE, AND THROAT: No tinnitus or ear pain.  RESPIRATORY: No cough, shortness of breath, wheezing or hemoptysis.  CARDIOVASCULAR: No chest pain, orthopnea, edema.  GASTROINTESTINAL: No nausea, vomiting, diarrhea or abdominal pain.  GENITOURINARY: No dysuria, hematuria.  ENDOCRINE: No polyuria, nocturia,  HEMATOLOGY: No anemia, easy bruising or bleeding SKIN: No rash or lesion. MUSCULOSKELETAL: Significant left hip pain NEUROLOGIC: No tingling, numbness, weakness.  PSYCHIATRY: No anxiety but mild depression present.   DRUG ALLERGIES:   Allergies  Allergen Reactions  . Ambien [Zolpidem] Other (See Comments)    Reaction:  Hallucinations  . Codeine Nausea And Vomiting    VITALS:  Blood pressure 136/58, pulse 88, temperature 98.2 F (36.8 C), temperature source Oral, resp. rate 16, height 5\' 4"  (1.626 m), weight 81.194 kg (179 lb), SpO2 96 %.  PHYSICAL EXAMINATION:  GENERAL:  81 y.o.-year-old patient lying in the bed with no acute distress.  EYES: Pupils equal, round, reactive to light and accommodation. No scleral icterus. Extraocular muscles intact.  HEENT: Head atraumatic, normocephalic. Oropharynx and nasopharynx clear.  NECK:  Supple, no jugular venous distention. No thyroid enlargement, no tenderness.  LUNGS: Decreased  breath sounds bilateral bases. No wheezing or crackles heard today. CARDIOVASCULAR: S1, S2 normal. 3/6 systolic murmur present, no rubs or gallops  ABDOMEN: Soft, nontender, nondistended. Bowel sounds present. No organomegaly or mass.  EXTREMITIES: No pedal edema, cyanosis, or clubbing. Bloody drainage in the bag from left hip wound. NEUROLOGIC: Cranial nerves II through XII are intact. Muscle strength 5/5 in all extremities except the left lower extremity due to pain. Sensation intact. Gait not checked.  PSYCHIATRIC: The patient is alert and awake. SKIN: No obvious rash, lesion, or ulcer.    LABORATORY PANEL:   CBC  Recent Labs Lab 01/17/15 0505 01/18/15 0625  WBC 9.2  --   HGB 8.4* 8.8*  HCT 25.1*  --   PLT 116*  --    ------------------------------------------------------------------------------------------------------------------  Chemistries   Recent Labs Lab 01/18/15 0625  NA 140  K 3.9  CL 94*  CO2 41*  GLUCOSE 123*  BUN 28*  CREATININE 0.82  CALCIUM 8.1*   ------------------------------------------------------------------------------------------------------------------  Cardiac Enzymes No results for input(s): TROPONINI in the last 168 hours. ------------------------------------------------------------------------------------------------------------------  RADIOLOGY:  Dg Hip Unilat With Pelvis 2-3 Views Left  01/17/2015   CLINICAL DATA:  Status post left hip replacement. Fall, with left hip pain. Initial encounter.  EXAM: DG HIP (WITH OR WITHOUT PELVIS) 2-3V LEFT  COMPARISON:  Left hip radiographs performed 01/14/2015  FINDINGS: There is no evidence of fracture or dislocation. The unusual appearance of the left acetabulum is thought to reflect positioning. The patient's left hip arthroplasty appears grossly intact, without evidence of loosening. The right femoral head is seated normally within its acetabulum. No significant degenerative change is appreciated. The  sacroiliac  joints are unremarkable in appearance.  The visualized bowel gas pattern is grossly unremarkable in appearance. Postoperative change is noted overlying the left hip.  IMPRESSION: No evidence of fracture or dislocation. Left hip arthroplasty appears grossly intact, without evidence of loosening. The unusual appearance of the left acetabulum is thought reflect positioning.   Electronically Signed   By: Garald Balding M.D.   On: 01/17/2015 17:29    EKG:   Orders placed or performed during the hospital encounter of 01/13/15  . ED EKG  . ED EKG  . EKG 12-Lead  . EKG 12-Lead    ASSESSMENT AND PLAN:   81 year old female past medical history significant for coronary artery disease status post recent coronary artery bypass graft surgery in May 2016, hypertension and anemia diastolic CHF presents to the hospital after a fall and left femoral neck fracture.  * Left femoral neck fracture. Status post Left hip hemiarthroplasty -Postoperative day 4 today, physical therapy and occupational therapy consults are appreciated. -Recovery has been slow with the therapy due to post-stop the weakness and lethargy issues. -Most likely her decreased mental acuity is caused by narcotic pain medications.  -We'll discontinue the oxycodone. We'll avoid giving morphine unless really necessary. - We'll continue tramadol and Tylenol for pain and make the patient sit in the chair during the day and sleep at night time. Continue to monitor. -Plan of care explained to daughter who is at bedside. -Incentive spirometry -Lovenox has been started for DVT prophylaxis postoperatively.  * Acute blood loss anemia s/p surgery Monitor. No transfusion at this time unless < 7. Myoglobin stable around 8.8 today  * Acute on chronic diastolic chf -As noted on chest x-ray. Recent bypass surgery and with the acute stressor which is her hip surgery might haven't been to cardiac decompensation. -Decrease her Lasix dose from  every 8 hours to daily at this time. -Encouraged incentive spirometry and monitor  * Acute respiratory failure-resolved at this time. -On 2 L oxygen at this time. Due to chf and atelectasis  * History of coronary artery disease-recent CABG in May 2016. Patient has never recovered extremely well following her CABG and was living at home. -Due to recent hip surgery and CABG continue to monitor her cardiac status. - continue low-dose Coreg and also on aspirin and statin  * Hyperlipidemia,  continue atorvastatin.  * Hypothyroidism,  continue levothyroxine.  *DVT prophylaxis-continue Lovenox  All the records are reviewed and case discussed with Care Management/Social Workerr. Management plans discussed with the patient, family and they are in agreement.  CODE STATUS: DO NOT RESUSCITATE  TOTAL TIME TAKING CARE OF THIS PATIENT: 38 minutes.   POSSIBLE D/C 1-2 DAYS DEPENDING ON CLINICAL CONDITION.   Gladstone Lighter M.D on 01/18/2015 at 4:39 PM  Between 7am to 6pm - Pager - [phone]  After 6pm go to [email or web address] - password [email or web address] [facility]  Office  [phone]  CC: Primary care physician; [name], MD

## 2015-01-18 NOTE — Progress Notes (Signed)
Per MD patient is not medically stable for D/C today. Clinical Education officer, museum (CSW) made [employer] [facility] aware of above. CSW also left voicemail with Carleene Overlie [facility] case manager making her aware of above. CSW will continue to follow and assist as needed.   Blima Rich, [place] [phone]

## 2015-01-18 NOTE — Progress Notes (Signed)
Physical Therapy Treatment Patient Details Name: Jenna Marshall MRN: [id number] DOB: 09-15-1925 Today's Date: 01/18/2015    History of Present Illness Jenna Marshall is a 81 y.o. female with a known history of coronary artery disease with CABG 3 months ago. She presents after hitting her toe on a chair and a fall. In the ER, she was found to have a left femoral neck fracture. pt underwent L hip ORIF 01/14/15. WBAT    PT Comments    Pt more alert this pm, but continues mildly lethargic and fatigued. Pt agreeable to up in chair. Continues to require heavy cueing and physical assist for all mobility. Pt did demonstrate an increased ability to stand with a little less assist, but requires Max A x 2 to pivot transfer to the chair. Pt performs active assisted range of motion exercises in chair and encouraged to perform ankle pumps and isometric strengthening several times this afternoon while in the chair. Continue PT to progress strength to improve functional mobility.   Follow Up Recommendations  SNF     Equipment Recommendations       Recommendations for Other Services       Precautions / Restrictions Precautions Precautions: Fall;Posterior Hip Restrictions Weight Bearing Restrictions: Yes LLE Weight Bearing: Weight bearing as tolerated    Mobility  Bed Mobility Overal bed mobility: Needs Assistance Bed Mobility: Supine to Sit Rolling: Max assist   Supine to sit: Max assist     General bed mobility comments: requires Max A to scoot to edge of bed  Transfers Overall transfer level: Needs assistance Equipment used:  (2 person physical assist ) Transfers: Sit to/from [facility] Sit to Stand: Mod assist;+2 physical assistance Stand pivot transfers: Max assist;+2 physical assistance       General transfer comment: Pt demonstrates greater ability to assist with stand but cannot step toward chair; requires Max A pivot and cues to reach for chair.    Ambulation/Gait             General Gait Details: unable at this time   Stairs            Wheelchair Mobility    Modified Rankin (Stroke Patients Only)       Balance Overall balance assessment: Needs assistance Sitting-balance support: Bilateral upper extremity supported;Feet supported Sitting balance-Leahy Scale: Fair   Postural control: Right lateral lean (requires cues and assist to right self, can maintain after) Standing balance support: During functional activity Standing balance-Leahy Scale: Zero                      Cognition Arousal/Alertness: Awake/alert Behavior During Therapy: Flat affect Overall Cognitive Status: Impaired/Different from baseline Area of Impairment: Attention;Following commands;Problem solving     Memory: Decreased recall of precautions Following Commands: Follows one step commands inconsistently     Problem Solving: Slow processing;Difficulty sequencing;Decreased initiation (often requests therapist/asst to "wait") General Comments: Pt was alert and able to participate for about 10 minutes and was discussing use of AD and pt quickly became lethargic and asleep.  Daughter is very distraught that she is not back to her baseline of wanting to talk and get up out of bed and be independent again. Spoke to Ephraim from [place] about concerns and that she is need of O2 again for sats into the 70s last night.    Exercises Total Joint Exercises Ankle Circles/Pumps: AROM;Both;20 reps;Seated Quad Sets: Strengthening;Both;20 reps;Seated Gluteal Sets: Strengthening;Both;20 reps;Seated Towel Squeeze: Strengthening;Both;20 reps;Seated Short Arc  Quad: AAROM;Both;20 reps;Seated Heel Slides: AAROM;Both;20 reps;Seated Hip ABduction/ADduction: AAROM;Both;20 reps;Seated Long Arc Quad: AAROM;Both;10 reps    General Comments        Pertinent Vitals/Pain Pain Assessment: 0-10 Pain Score: 4  Pain Location: L mid to low back; L hip aches  mildly Pain Descriptors / Indicators: Aching;Constant Pain Intervention(s): Ice applied (to hip; unable to keep ice on back in chair; doesnt wish med)    Home Living Family/patient expects to be discharged to:: Skilled nursing facility Living Arrangements: Alone             Additional Comments: 1 step to enter    Prior Function Level of Independence: Independent with assistive device(s)      Comments: household ambulation with SPC, family helps with IADL   PT Goals (current goals can now be found in the care plan section) Acute Rehab PT Goals Patient Stated Goal: to stay more awake and have a BM Progress towards PT goals: Progressing toward goals    Frequency  BID    PT Plan Current plan remains appropriate    Co-evaluation             End of Session   Activity Tolerance: Patient limited by fatigue Patient left: in chair;with call bell/phone within reach;with chair alarm set     Time: 1257-1336 PT Time Calculation (min) (ACUTE ONLY): 39 min  Charges:  $Therapeutic Exercise: 23-37 mins $Therapeutic Activity: 8-22 mins                    G Codes:      Charlaine Dalton 01/18/2015, 1:59 PM

## 2015-01-18 NOTE — Evaluation (Signed)
Occupational Therapy Evaluation Patient Details Name: Jenna Marshall MRN: [id number] DOB: Aug 15, 1925 Today's Date: 01/18/2015    History of Present Illness Jenna Marshall is a 81 y.o. female with a known history of coronary artery disease with CABG 3 months ago. She presents after hitting her toe on a chair and a fall. In the ER, she was found to have a left femoral neck fracture. pt underwent L hip ORIF 01/14/15. 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  She recently fell after hitting her toe and sustained a L femoral neck fracture and is s/p L hip ORIF.  She is eager to return to PLOF.  Pt is currently limited in functional ADLs due to decreased alertness, pain, decreased ROM.  Her decreased level of alertness is new and her daughter is very concerned which was relayed to Jenna Marshall.  Pt requires maximum assist for LB dressing and bathing skills due to pain and decreased AROM of LLE  and would benefit from continued skilled OT services for education in assistive devices, functional mobility, and education in recommendations for home modifications to increase safety and prevent falls.  Pt is a good candidate for SNF to continue rehabilitation as long as her ability to stay alert improves to make progress.     Follow Up Recommendations  SNF    Equipment Recommendations       Recommendations for Other Services       Precautions / Restrictions Precautions Precautions: Fall;Posterior Hip Restrictions Weight Bearing Restrictions: Yes LLE Weight Bearing: Weight bearing as tolerated      Mobility Bed Mobility                  Transfers                      Balance                                            ADL                                         General ADL Comments: Pt is currently max assist to dependent for all ADLs due to  decreased ability to attend to task and stay alert which is a major change from baseline and her daughter is very concerned. Pt was using reacher and sock aid at [name] after CABG surgery in May and before hip surgery.       Vision     Perception     Praxis      Pertinent Vitals/Pain Pain Assessment: 0-10 Pain Score: 4  Pain Location: L hip Pain Descriptors / Indicators: Aching;Sore Pain Intervention(s): Limited activity within patient's tolerance;Monitored during session;Premedicated before session;Repositioned     Hand Dominance Right   Extremity/Trunk Assessment Upper Extremity Assessment Upper Extremity Assessment: Generalized weakness   Lower Extremity Assessment Lower Extremity Assessment: Defer to PT evaluation   Cervical / Trunk Assessment Cervical / Trunk Assessment: Kyphotic   Communication Communication Communication: HOH   Cognition Arousal/Alertness: Lethargic Behavior During Therapy: Flat affect Overall Cognitive Status: Impaired/Different from baseline (Pt's daughter is very concerned about her mother not able to  stay alert and participate and feels there is something majorly wrong and wants to know why and feels she is not going be to be able to participate in therapy at SNF if she leaves today.  ) Area of Impairment: Attention;Following commands;Problem solving     Memory: Decreased recall of precautions Following Commands: Follows one step commands inconsistently     Problem Solving: Slow processing;Difficulty sequencing;Decreased initiation General Comments: Pt was alert and able to participate for about 10 minutes and was discussing use of AD and pt quickly became lethargic and asleep.  Daughter is very distraught that she is not back to her baseline of wanting to talk and get up out of bed and be independent again. Spoke to [name] from [name] about concerns and that she is need of O2 again for sats into the 70s last night.   General Comments        Exercises       Shoulder Instructions      Home Living Family/patient expects to be discharged to:: Skilled nursing facility Living Arrangements: Alone                               Additional Comments: 1 step to enter      Prior Functioning/Environment Level of Independence: Independent with assistive device(s)        Comments: household ambulation with SPC, family helps with IADL    OT Diagnosis: Generalized weakness   OT Problem List: Decreased strength;Pain;Decreased safety awareness;Decreased cognition   OT Treatment/Interventions: Self-care/ADL training;DME and/or AE instruction;Therapeutic exercise;Therapeutic activities    OT Goals(Current goals can be found in the care plan section) Acute Rehab OT Goals Patient Stated Goal: to stay more awake and have a BM OT Goal Formulation: With patient/family Time For Goal Achievement: 02/01/15 Potential to Achieve Goals: Fair  OT Frequency: Min 1X/week   Barriers to D/C:    pt's change in level of alertness and attention which is not her baseline level per daughter's report       Co-evaluation              End of Session Nurse Communication: Other (comment) (concerns about pt's lack of alertness and daughter's concerns that this behavior is not her baseline)  Activity Tolerance: Patient limited by lethargy Patient left: in bed;with call bell/phone within reach;with bed alarm set;with family/visitor present (daughter in room with her)   Time: 7001-7494 OT Time Calculation (min): 35 min Charges:  OT General Charges $OT Visit: 1 Procedure OT Evaluation $Initial OT Evaluation Tier I: 1 Procedure OT Treatments $Self Care/Home Management : 8-22 mins G-Codes:    Jenna Marshall,Jenna Marshall 02-02-2015, 10:40 AM    Jenna Marshall, OTR/L ascom [phone]

## 2015-01-18 NOTE — Progress Notes (Signed)
PT Cancellation Note  Patient Details Name: Jenna Marshall MRN: [id number] DOB: May 16, 1926   Cancelled Treatment:    Reason Eval/Treat Not Completed: Fatigue/lethargy limiting ability to participate. Treatment attempted; pt extremely lethargic and unable to remain awake. Spoke with daughter at length regarding her concerns for patients decline and inability to participate well with therapy in comparison to last episode of therapy following open heart surgery. Will re attempt PT later this a.m as schedule allows and p.m session.    Erline Levine Bishop 01/18/2015, 10:01 AM

## 2015-01-19 ENCOUNTER — Telehealth: Payer: Self-pay | Admitting: Cardiovascular Disease

## 2015-01-19 ENCOUNTER — Telehealth: Payer: Self-pay | Admitting: Internal Medicine

## 2015-01-19 DIAGNOSIS — R410 Disorientation, unspecified: Secondary | ICD-10-CM | POA: Diagnosis not present

## 2015-01-19 DIAGNOSIS — M79641 Pain in right hand: Secondary | ICD-10-CM | POA: Diagnosis not present

## 2015-01-19 DIAGNOSIS — R32 Unspecified urinary incontinence: Secondary | ICD-10-CM | POA: Diagnosis not present

## 2015-01-19 DIAGNOSIS — I25810 Atherosclerosis of coronary artery bypass graft(s) without angina pectoris: Secondary | ICD-10-CM | POA: Diagnosis not present

## 2015-01-19 DIAGNOSIS — M6281 Muscle weakness (generalized): Secondary | ICD-10-CM | POA: Diagnosis not present

## 2015-01-19 DIAGNOSIS — M810 Age-related osteoporosis without current pathological fracture: Secondary | ICD-10-CM | POA: Diagnosis not present

## 2015-01-19 DIAGNOSIS — I2510 Atherosclerotic heart disease of native coronary artery without angina pectoris: Secondary | ICD-10-CM | POA: Diagnosis not present

## 2015-01-19 DIAGNOSIS — M791 Myalgia: Secondary | ICD-10-CM | POA: Diagnosis not present

## 2015-01-19 DIAGNOSIS — S72002A Fracture of unspecified part of neck of left femur, initial encounter for closed fracture: Secondary | ICD-10-CM | POA: Diagnosis not present

## 2015-01-19 DIAGNOSIS — R5383 Other fatigue: Secondary | ICD-10-CM | POA: Diagnosis not present

## 2015-01-19 DIAGNOSIS — E871 Hypo-osmolality and hyponatremia: Secondary | ICD-10-CM | POA: Diagnosis not present

## 2015-01-19 DIAGNOSIS — D62 Acute posthemorrhagic anemia: Secondary | ICD-10-CM | POA: Diagnosis not present

## 2015-01-19 DIAGNOSIS — R42 Dizziness and giddiness: Secondary | ICD-10-CM | POA: Diagnosis not present

## 2015-01-19 DIAGNOSIS — I10 Essential (primary) hypertension: Secondary | ICD-10-CM | POA: Diagnosis not present

## 2015-01-19 DIAGNOSIS — R400 Somnolence: Secondary | ICD-10-CM | POA: Diagnosis not present

## 2015-01-19 DIAGNOSIS — S72142D Displaced intertrochanteric fracture of left femur, subsequent encounter for closed fracture with routine healing: Secondary | ICD-10-CM

## 2015-01-19 DIAGNOSIS — E039 Hypothyroidism, unspecified: Secondary | ICD-10-CM | POA: Diagnosis not present

## 2015-01-19 DIAGNOSIS — Z471 Aftercare following joint replacement surgery: Secondary | ICD-10-CM | POA: Diagnosis not present

## 2015-01-19 DIAGNOSIS — I509 Heart failure, unspecified: Secondary | ICD-10-CM | POA: Diagnosis not present

## 2015-01-19 DIAGNOSIS — I5033 Acute on chronic diastolic (congestive) heart failure: Secondary | ICD-10-CM | POA: Diagnosis not present

## 2015-01-19 DIAGNOSIS — M7989 Other specified soft tissue disorders: Secondary | ICD-10-CM | POA: Diagnosis not present

## 2015-01-19 DIAGNOSIS — R509 Fever, unspecified: Secondary | ICD-10-CM | POA: Diagnosis not present

## 2015-01-19 LAB — BASIC METABOLIC PANEL
ANION GAP: 6 (ref 5–15)
BUN: 30 mg/dL — ABNORMAL HIGH (ref 6–20)
CO2: 38 mmol/L — AB (ref 22–32)
Calcium: 8.3 mg/dL — ABNORMAL LOW (ref 8.9–10.3)
Chloride: 92 mmol/L — ABNORMAL LOW (ref 101–111)
Creatinine, Ser: 0.81 mg/dL (ref 0.44–1.00)
GFR calc Af Amer: >60 mL/min (ref >60)
GFR calc non Af Amer: >60 mL/min (ref >60)
GLUCOSE: 124 mg/dL — AB (ref 65–99)
POTASSIUM: 3.9 mmol/L (ref 3.5–5.1)
Sodium: 136 mmol/L (ref 135–145)

## 2015-01-19 MED ORDER — FERROUS SULFATE 325 (65 FE) MG PO TABS: 325.0000 mg | ORAL_TABLET | Freq: Two times a day (BID) | ORAL | Status: DC

## 2015-01-19 MED ORDER — SENNOSIDES-DOCUSATE SODIUM 8.6-50 MG PO TABS: 1.0000 | ORAL_TABLET | Freq: Two times a day (BID) | ORAL | Status: DC

## 2015-01-19 MED ORDER — TRAMADOL HCL 50 MG PO TABS: 50.0000 mg | ORAL_TABLET | Freq: Four times a day (QID) | ORAL | Status: DC | PRN

## 2015-01-19 MED ORDER — ENOXAPARIN SODIUM 40 MG/0.4ML ~~LOC~~ SOLN: 40.0000 mg | SUBCUTANEOUS | Status: DC

## 2015-01-19 MED ORDER — FUROSEMIDE 40 MG PO TABS: 20.0000 mg | ORAL_TABLET | Freq: Every day | ORAL | Status: AC

## 2015-01-19 MED ORDER — ASPIRIN 81 MG PO CHEW: 81.0000 mg | CHEWABLE_TABLET | Freq: Every day | ORAL | Status: AC

## 2015-01-19 MED ORDER — OXYCODONE HCL 5 MG PO TABS: 5.0000 mg | ORAL_TABLET | ORAL | Status: DC | PRN

## 2015-01-19 NOTE — Telephone Encounter (Signed)
Noted. Thank you. Will follow up.

## 2015-01-19 NOTE — Telephone Encounter (Signed)
Daughter called to check date of June appt.

## 2015-01-19 NOTE — Discharge Summary (Signed)
[facility] [place] NAME: Jenna Marshall    MR#:  [id number]  DATE OF BIRTH:  02-12-1926  DATE OF ADMISSION:  01/13/2015 ADMITTING PHYSICIAN: Loletha Grayer, MD  DATE OF DISCHARGE: 01/19/2015  PRIMARY CARE PHYSICIAN: [name], MD    ADMISSION DIAGNOSIS:  Closed left hip fracture, initial encounter [S72.002A]  DISCHARGE DIAGNOSIS:  Active Problems:   Closed left hip fracture   SECONDARY DIAGNOSIS:   Past Medical History  Diagnosis Date  . Hyperlipemia   . CAD (coronary artery disease)     a. Lexiscan 10/13/14: mid anterior to apical & inf wall ischemia w/ WMA, mild to mod dep EF; b. cardiac cath 10/14/2014: ost LM to LM 50%, LM 99%, ost LAD 95%, ost LAD 95%, prox LAD to mid LAD 40%, prox RCA 30%, mid RCA 70%, Critical left main stenosis. Significant distal RCA stenosis. Heavily calcified arteries. Normal EF by echo with no significant AS or MR. Recommend urgent CABG. cCABG x 4 (5/201  . GERD (gastroesophageal reflux disease)   . Macular degeneration   . Hyperlipidemia   . Hypertension   . Hypothyroidism   . Depression   . pernicious anemia   . Varicose veins   . CAD (coronary artery disease)     a. Lexi 10/13/14: mid ant to apical & inf wall ischemia w/ WMA, mild-mod dep EF; b. cath 10/14/14: ost LM to LM 50%, dLM 99%, ost LAD 95%, ost LAD 95%, pLAD-LAD 40%, pRCA 30%, mRCA 70%, Critical LM stenosis. Sig dRCA stenosis. Heavily calcified arteries; c. CABG x 4 (10/2014)  . Diastolic dysfunction     a. echo 10/2014: EF 55-60%, no RWMA, GR1DD, mild AI, trivial MR, mildly dilated LA, PASP normal    HOSPITAL COURSE:   81 year old female past medical history significant for coronary artery disease status post recent coronary artery bypass graft surgery in May 2016, hypertension and anemia diastolic CHF presents to the hospital after a fall and left femoral neck fracture.  * Left femoral neck fracture. Status post Left hip  hemiarthroplasty -Postoperative day 5 today, physical therapy and occupational therapy consults are appreciated. -Recovery has been slow with the therapy due to post-op weakness and lethargy issues. -Most likely her decreased mental acuity is caused by narcotic pain medications.  -Mental status is much better and more alert after stopping morphine and oxycodone - We'll continue tramadol and Tylenol for pain and make the patient sit in the chair during the day and sleep at night time. Continue to monitor. -Plan of care explained to daughter who is at bedside. -Incentive spirometry -Lovenox has been started for DVT prophylaxis postoperatively. Continue for 2 weeks -Also some depression as patient had 2 major surgeries within 3 months. Counseling and positive encouragement given with improvement.  * Acute blood loss anemia s/p surgery Monitor. No transfusion at this time unless < 7. Hemoglobin stable around 8.8 yesterday  * Acute on chronic diastolic chf -As noted on chest x-ray. Recent bypass surgery and with the acute stressor which is her hip surgery might have caused cardiac decompensation. -Decrease her Lasix dose to daily. Stop after 2 weeks and reevaluate. -Echo with normal ejection fraction but some diastolic dysfunction noted -Encouraged incentive spirometry and monitor  * Acute respiratory failure-resolved at this time. -On 2 L oxygen at this time. Wean as tolerated. Started on low-dose Lasix. - Due to chf and atelectasis  * History of coronary artery disease-recent CABG in May 2016. Patient  has recovered extremely well following her CABG and was living at home. -Due to recent hip surgery and CABG -we monitored her cardiac status. She has been stable here. -Started on low-dose Lasix due to mild congestion as she hasn't been out of bed, continue for 2 weeks and stop. -Remains on 2 L oxygen here and wean as tolerated. - continue low-dose Coreg and also on aspirin and statin  *  Hyperlipidemia, continue atorvastatin.  * Hypothyroidism, continue levothyroxine.  *DVT prophylaxis-continue Lovenox-prescribed for 2 weeks  DISCHARGE CONDITIONS:   Guarded  CONSULTS OBTAINED:  Treatment Team:  Dereck Leep, MD  DRUG ALLERGIES:   Allergies  Allergen Reactions  . Ambien [Zolpidem] Other (See Comments)    Reaction:  Hallucinations  . Codeine Nausea And Vomiting    DISCHARGE MEDICATIONS:   Current Discharge Medication List    START taking these medications   Details  aspirin 81 MG chewable tablet Chew 1 tablet (81 mg total) by mouth daily. Qty: 30 tablet, Refills: 0    enoxaparin (LOVENOX) 40 MG/0.4ML injection Inject 0.4 mLs (40 mg total) into the skin daily. Qty: 14 Syringe, Refills: 0    ferrous sulfate 325 (65 FE) MG tablet Take 1 tablet (325 mg total) by mouth 2 (two) times daily with a meal. Qty: 30 tablet, Refills: 3    furosemide (LASIX) 40 MG tablet Take 0.5 tablets (20 mg total) by mouth daily. Qty: 15 tablet, Refills: 0    oxyCODONE (OXY IR/ROXICODONE) 5 MG immediate release tablet Take 1 tablet (5 mg total) by mouth every 4 (four) hours as needed for severe pain or breakthrough pain ((for SEVERE breakthrough pain)). Qty: 20 tablet, Refills: 0    senna-docusate (SENOKOT-S) 8.6-50 MG per tablet Take 1 tablet by mouth 2 (two) times daily. Qty: 30 tablet, Refills: 0    traMADol (ULTRAM) 50 MG tablet Take 1-2 tablets (50-100 mg total) by mouth every 6 (six) hours as needed for moderate pain. Qty: 20 tablet, Refills: 0      CONTINUE these medications which have NOT CHANGED   Details  acetaminophen (TYLENOL) 325 MG tablet Take 2 tablets (650 mg total) by mouth every 6 (six) hours as needed for mild pain.    atorvastatin (LIPITOR) 40 MG tablet Take 1 tablet (40 mg total) by mouth daily. Qty: 30 tablet, Refills: 3    Calcium Carbonate-Vitamin D (CALCIUM + D) 600-200 MG-UNIT TABS Take 1 tablet by mouth 2 (two) times daily.     carvedilol  (COREG) 3.125 MG tablet Take 3.125 mg by mouth 2 (two) times daily.    levothyroxine (SYNTHROID, LEVOTHROID) 25 MCG tablet Take 25 mcg by mouth daily before breakfast.    Multiple Vitamin (MULTIVITAMIN WITH MINERALS) TABS tablet Take 1 tablet by mouth daily.    Multiple Vitamins-Minerals (PRESERVISION AREDS 2) CAPS Take 2 capsules by mouth daily.    pantoprazole (PROTONIX) 40 MG tablet Take 1 tablet (40 mg total) by mouth daily. Qty: 30 tablet, Refills: 3   Associated Diagnoses: Chest pain, unspecified chest pain type    triamcinolone cream (KENALOG) 0.1 % Apply 1 application topically 2 (two) times daily. Qty: 45 g, Refills: 0      STOP taking these medications     aspirin EC 325 MG EC tablet          DISCHARGE INSTRUCTIONS:   1. PCP follow-up in 2 weeks 2. Follow-up with orthopedics in 4-6 weeks 3. Wean oxygen as tolerated  If you experience worsening of your  admission symptoms, develop shortness of breath, life threatening emergency, suicidal or homicidal thoughts you must seek medical attention immediately by calling 911 or calling your MD immediately  if symptoms less severe.  You Must read complete instructions/literature along with all the possible adverse reactions/side effects for all the Medicines you take and that have been prescribed to you. Take any new Medicines after you have completely understood and accept all the possible adverse reactions/side effects.   Please note  You were cared for by a hospitalist during your hospital stay. If you have any questions about your discharge medications or the care you received while you were in the hospital after you are discharged, you can call the unit and asked to speak with the hospitalist on call if the hospitalist that took care of you is not available. Once you are discharged, your primary care physician will handle any further medical issues. Please note that NO REFILLS for any discharge medications will be authorized  once you are discharged, as it is imperative that you return to your primary care physician (or establish a relationship with a primary care physician if you do not have one) for your aftercare needs so that they can reassess your need for medications and monitor your lab values.    Today   CHIEF COMPLAINT:   Chief Complaint  Patient presents with  . Hip Pain   Patient is more alert today. Less depressed and appears more into therapy today. Daughter at bedside  VITAL SIGNS:  Blood pressure 136/60, pulse 86, temperature 98.2 F (36.8 C), temperature source Oral, resp. rate 18, height 5\' 4"  (1.626 m), weight 81.194 kg (179 lb), SpO2 91 %.  I/O:   Intake/Output Summary (Last 24 hours) at 01/19/15 1044 Last data filed at 01/18/15 1812  Gross per 24 hour  Intake    480 ml  Output      0 ml  Net    480 ml    PHYSICAL EXAMINATION:   Physical Exam  GENERAL: 81 y.o.-year-old patient lying in the bed with no acute distress.  EYES: Pupils equal, round, reactive to light and accommodation. No scleral icterus. Extraocular muscles intact.  HEENT: Head atraumatic, normocephalic. Oropharynx and nasopharynx clear.  NECK: Supple, no jugular venous distention. No thyroid enlargement, no tenderness.  LUNGS: Decreased breath sounds bilateral bases. No wheezing or crackles heard today. CARDIOVASCULAR: S1, S2 normal. 3/6 systolic murmur present, no rubs or gallops  ABDOMEN: Soft, nontender, nondistended. Bowel sounds present. No organomegaly or mass.  EXTREMITIES: No pedal edema, cyanosis, or clubbing. Bloody drainage in the bag from left hip wound. NEUROLOGIC: Cranial nerves II through XII are intact. Muscle strength 5/5 in all extremities except the left lower extremity due to pain. Sensation intact. Gait not checked.  PSYCHIATRIC: The patient is alert and awake. SKIN: No obvious rash, lesion, or ulcer.    DATA REVIEW:   CBC  Recent Labs Lab 01/17/15 0505 01/18/15 0625  WBC  9.2  --   HGB 8.4* 8.8*  HCT 25.1*  --   PLT 116*  --     Chemistries   Recent Labs Lab 01/19/15 0637  NA 136  K 3.9  CL 92*  CO2 38*  GLUCOSE 124*  BUN 30*  CREATININE 0.81  CALCIUM 8.3*    Cardiac Enzymes No results for input(s): TROPONINI in the last 168 hours.  Microbiology Results  Results for orders placed or performed during the hospital encounter of 01/13/15  MRSA PCR Screening  Status: Abnormal   Collection Time: 01/13/15 11:55 PM  Result Value Ref Range Status   MRSA by PCR POSITIVE (A) NEGATIVE Final    Comment:        The GeneXpert MRSA Assay (FDA approved for NASAL specimens only), is one component of a comprehensive MRSA colonization surveillance program. It is not intended to diagnose MRSA infection nor to guide or monitor treatment for MRSA infections. CRITICAL RESULT CALLED TO, READ BACK BY AND VERIFIED WITH:  JENNIFER COBLE AT 0113 01/14/15 SDR     RADIOLOGY:  Dg Hip Unilat With Pelvis 2-3 Views Left  01/17/2015   CLINICAL DATA:  Status post left hip replacement. Fall, with left hip pain. Initial encounter.  EXAM: DG HIP (WITH OR WITHOUT PELVIS) 2-3V LEFT  COMPARISON:  Left hip radiographs performed 01/14/2015  FINDINGS: There is no evidence of fracture or dislocation. The unusual appearance of the left acetabulum is thought to reflect positioning. The patient's left hip arthroplasty appears grossly intact, without evidence of loosening. The right femoral head is seated normally within its acetabulum. No significant degenerative change is appreciated. The sacroiliac joints are unremarkable in appearance.  The visualized bowel gas pattern is grossly unremarkable in appearance. Postoperative change is noted overlying the left hip.  IMPRESSION: No evidence of fracture or dislocation. Left hip arthroplasty appears grossly intact, without evidence of loosening. The unusual appearance of the left acetabulum is thought reflect positioning.   Electronically  Signed   By: Garald Balding M.D.   On: 01/17/2015 17:29    EKG:   Orders placed or performed during the hospital encounter of 01/13/15  . ED EKG  . ED EKG  . EKG 12-Lead  . EKG 12-Lead      Management plans discussed with the patient, family and they are in agreement.  CODE STATUS:     Code Status Orders        Start     Ordered   01/14/15 1449  Do not attempt resuscitation (DNR)   Continuous    Question Answer Comment  In the event of cardiac or respiratory ARREST Do not call a "code blue"   In the event of cardiac or respiratory ARREST Do not perform Intubation, CPR, defibrillation or ACLS   In the event of cardiac or respiratory ARREST Use medication by any route, position, wound care, and other measures to relive pain and suffering. May use oxygen, suction and manual treatment of airway obstruction as needed for comfort.      01/14/15 1448      TOTAL TIME TAKING CARE OF THIS PATIENT: 40 minutes.    Gladstone Lighter M.D on 01/19/2015 at 10:44 AM  Between 7am to 6pm - Pager - [phone]  After 6pm go to [email or web address] - password [email or web address]  Office  [phone]  CC: Primary care physician; [name], MD

## 2015-01-19 NOTE — Progress Notes (Signed)
EMS called at [phone], Report called to [facility], Therapist, sports. Rx.slip sent with discharge paperwork for oxycodone and tramadol.

## 2015-01-19 NOTE — Telephone Encounter (Signed)
Referral is in process as requested 

## 2015-01-19 NOTE — Discharge Instructions (Signed)
HIP FRACTURE POSTOPERATIVE DIRECTIONS  Hip Rehabilitation, Guidelines Following Surgery  The results of a hip operation are greatly improved after range of motion and muscle strengthening exercises. Follow all safety measures which are given to protect your hip. If any of these exercises cause increased pain or swelling in your joint, decrease the amount until you are comfortable again. Then slowly increase the exercises. Call your caregiver if you have problems or questions.   HOME CARE INSTRUCTIONS  Remove items at home which could result in a fall. This includes throw rugs or furniture in walking pathways.   ICE to the affected hip every three hours for 30 minutes at a time and then as needed for pain and swelling.  Continue to use ice on the hip for pain and swelling from surgery. You may notice swelling that will progress down to the foot and ankle.  This is normal after surgery.  Elevate the leg when you are not up walking on it.    Continue to use the breathing machine which will help keep your temperature down.  It is common for your temperature to cycle up and down following surgery, especially at night when you are not up moving around and exerting yourself.  The breathing machine keeps your lungs expanded and your temperature down.  Remove staples and apply steri-strips on 01/28/15  DIET You may resume your previous home diet once your are discharged from the hospital. If you go to a rehab facility after surgery you will need to be on the diet appropriate for your medical history.   DRESSING / WOUND  CARE / SHOWERING  DO NOT GET THE INCISION WET You may start showering once staples have been removed at 2 weeks(remove staples and apply steri-strips on 01/28/15). Change dressing as needed. Do not submerge the incision in water such as a bath tub, swimming pool or hot tubs until the incision is completely healed which is approximately 4 weeks.   ACTIVITY Walk with your walker as  instructed. Use walker as long as suggested by your caregivers.May go to using a cane once your therapist feels that it is safe to do so. Avoid periods of inactivity such as sitting longer than an hour when not asleep. This helps prevent blood clots.  You may resume a sexual relationship in one month or when given the OK by your doctor.  You may return to work once you are cleared by your doctor.  Do not drive a car for 6 weeks or until released by you surgeon.  Do not drive while taking narcotics.   WEIGHT BEARING You may wait bear as tolerated on the surgical leg.  POSTOPERATIVE CONSTIPATION PROTOCOL Constipation - defined medically as fewer than three stools per week and severe constipation as less than one stool per week.  One of the most common issues patients have following surgery is constipation.  Even if you have a regular bowel pattern at home, your normal regimen is likely to be disrupted due to multiple reasons following surgery.  Combination of anesthesia, postoperative narcotics, change in appetite and fluid intake all can affect your bowels.  In order to avoid complications following surgery, here are some recommendations in order to help you during your recovery period.  Colace (docusate) - Pick up an over-the-counter form of Colace or another stool softener and take twice a day as long as you are requiring postoperative pain medications.  Take with a full glass of water daily.  If you experience  loose stools or diarrhea, hold the colace until you stool forms back up.  If your symptoms do not get better within 1 week or if they get worse, check with your doctor.  Dulcolax (bisacodyl) - Pick up over-the-counter and take as directed by the product packaging as needed to assist with the movement of your bowels.  Take with a full glass of water.  Use this product as needed if not relieved by Colace only.   MiraLax (polyethylene glycol) - Pick up over-the-counter to have on hand.   MiraLax is a solution that will increase the amount of water in your bowels to assist with bowel movements.  Take as directed and can mix with a glass of water, juice, soda, coffee, or tea.  Take if you go more than two days without a movement. Do not use MiraLax more than once per day. Call your doctor if you are still constipated or irregular after using this medication for 7 days in a row.  If you continue to have problems with postoperative constipation, please contact the office for further assistance and recommendations.  If you experience "the worst abdominal pain ever" or develop nausea or vomiting, please contact the office immediatly for further recommendations for treatment.  ITCHING  If you experience itching with your medications, try taking only a single pain pill, or even half a pain pill at a time.  You can also use Benadryl over the counter for itching or also to help with sleep.   TED HOSE STOCKINGS Wear the elastic stockings on both legs. If you go home, you may remove these at night but will need to put them on the first thing in the morning. If you go to rehab, then you may remove them one hour per 8 hour shift. This is because in rehab you are not as active as you are at home.  MEDICATIONS See your medication summary on the After Visit Summary that the nursing staff will review with you prior to discharge.  You may have some home medications which will be placed on hold until you complete the course of blood thinner medication.  It is important for you to complete the blood thinner medication as prescribed by your surgeon.  Continue your approved medications as instructed at time of discharge.  PRECAUTIONS If you experience chest pain or shortness of breath - call 911 immediately for transfer to the hospital emergency department.  If you develop a fever greater that 101 F, purulent drainage from wound, increased redness or drainage from wound, foul odor from the  wound/dressing, or calf pain - CONTACT YOUR SURGEON.                                                   FOLLOW-UP APPOINTMENTS Make sure you keep all of your appointments after your operation with your surgeon and caregivers. You should call the office at the above phone number and make an appointment for approximately 6 weeks after the date of your surgery or on the date instructed by your surgeon outlined in the "After Visit Summary".  Sooner if there is complication.  RANGE OF MOTION AND STRENGTHENING EXERCISES  These exercises are designed to help you keep full movement of your hip joint. Follow your caregiver's or physical therapist's instructions. Perform all exercises about fifteen times, three times per day  or as directed. Exercise both hips, even if you have had only one joint replacement. These exercises can be done on a training (exercise) mat, on the floor, on a table or on a bed. Use whatever works the best and is most comfortable for you. Use music or television while you are exercising so that the exercises are a pleasant break in your day. This will make your life better with the exercises acting as a break in routine you can look forward to.  Lying on your back, slowly slide your foot toward your buttocks, raising your knee up off the floor. Then slowly slide your foot back down until your leg is straight again.  Lying on your back spread your legs as far apart as you can without causing discomfort.  Lying on your side, raise your upper leg and foot straight up from the floor as far as is comfortable. Slowly lower the leg and repeat.  Lying on your back, tighten up the muscle in the front of your thigh (quadriceps muscles). You can do this by keeping your leg straight and trying to raise your heel off the floor. This helps strengthen the largest muscle supporting your knee.  Lying on your back, tighten up the muscles of your buttocks both with the legs straight and with the knee bent at a  comfortable angle while keeping your heel on the floor.       IF YOU ARE TRANSFERRED TO A SKILLED REHAB FACILITY If the patient is transferred to a skilled rehab facility following release from the hospital, a list of the current medications will be sent to the facility for the patient to continue.  When discharged from the skilled rehab facility, please have the facility set up the patient's [place] prior to being released. Also, the skilled facility will be responsible for providing the patient with their medications at time of release from the facility to include their pain medication, the muscle relaxants, and their blood thinner medication. If the patient is still at the rehab facility at time of the two week follow up appointment, the skilled rehab facility will also need to assist the patient in arranging follow up appointment in our office and any transportation needs.  MAKE SURE YOU:  Understand these instructions.  Get help right away if you are not doing well or get worse.  remove staples and apply steri-strips on 01/28/15   Pick up stool softner and laxative for home use following surgery while on pain medications. Continue to use ice for pain and swelling after surgery. Do not use any lotions or creams on the incision until instructed by your surgeon.

## 2015-01-19 NOTE — Progress Notes (Signed)
Subjective: 5 Days Post-Op Procedure(s) (LRB): ARTHROPLASTY BIPOLAR HIP (HEMIARTHROPLASTY) (Left) Patient reports pain as 0/10. Patient is well, and has had no acute complaints or problems Continue with physical therapy today.  Plan is to go Rehab after hospital stay. no nausea and no vomiting Patient denies any chest pains or shortness of breath.  Objective: Vital signs in last 24 hours: Temp:  [97.7 F (36.5 C)-98.2 F (36.8 C)] 97.7 F (36.5 C) (08/18 0453) Pulse Rate:  [82-88] 88 (08/18 0453) Resp:  [14-18] 18 (08/18 0453) BP: (122-136)/(56-70) 132/60 mmHg (08/18 0453) SpO2:  [90 %-98 %] 93 % (08/18 0453) well approximated incision Heels are non tender and elevated off the bed using rolled towels Intake/Output from previous day: 08/17 0701 - 08/18 0700 In: 900 [P.O.:900] Out: -  Intake/Output this shift:     Recent Labs  01/17/15 0505 01/18/15 0625  HGB 8.4* 8.8*    Recent Labs  01/17/15 0505  WBC 9.2  RBC 2.64*  HCT 25.1*  PLT 116*    Recent Labs  01/18/15 0625 01/19/15 0637  NA 140 136  K 3.9 3.9  CL 94* 92*  CO2 41* 38*  BUN 28* 30*  CREATININE 0.82 0.81  GLUCOSE 123* 124*  CALCIUM 8.1* 8.3*   No results for input(s): LABPT, INR in the last 72 hours.  EXAM General - Patient is Alert, Appropriate and Oriented Extremity - Neurologically intact Neurovascular intact Sensation intact distally Intact pulses distally Dorsiflexion/Plantar flexion intact Dressing - CDI, Scant Drainage Motor Function - intact, moving foot and toes well on exam. Patient sequential compression not left foot secondary to increased pain in foot. - homans sign.  Past Medical History  Diagnosis Date  . Hyperlipemia   . CAD (coronary artery disease)     a. Lexiscan 10/13/14: mid anterior to apical & inf wall ischemia w/ WMA, mild to mod dep EF; b. cardiac cath 10/14/2014: ost LM to LM 50%, LM 99%, ost LAD 95%, ost LAD 95%, prox LAD to mid LAD 40%, prox RCA 30%, mid  RCA 70%, Critical left main stenosis. Significant distal RCA stenosis. Heavily calcified arteries. Normal EF by echo with no significant AS or MR. Recommend urgent CABG. cCABG x 4 (5/201  . GERD (gastroesophageal reflux disease)   . Macular degeneration   . Hyperlipidemia   . Hypertension   . Hypothyroidism   . Depression   . pernicious anemia   . Varicose veins   . CAD (coronary artery disease)     a. Lexi 10/13/14: mid ant to apical & inf wall ischemia w/ WMA, mild-mod dep EF; b. cath 10/14/14: ost LM to LM 50%, dLM 99%, ost LAD 95%, ost LAD 95%, pLAD-LAD 40%, pRCA 30%, mRCA 70%, Critical LM stenosis. Sig dRCA stenosis. Heavily calcified arteries; c. CABG x 4 (10/2014)  . Diastolic dysfunction     a. echo 10/2014: EF 55-60%, no RWMA, GR1DD, mild AI, trivial MR, mildly dilated LA, PASP normal    Assessment/Plan: 5 Days Post-Op Procedure(s) (LRB): ARTHROPLASTY BIPOLAR HIP (HEMIARTHROPLASTY) (Left) Active Problems:   Closed left hip fracture  Estimated body mass index is 30.71 kg/(m^2) as calculated from the following:   Height as of this encounter: 5\' 4"  (1.626 m).   Weight as of this encounter: 81.194 kg (179 lb). Up with therapy Discharge to SNF when medically cleared.  Labs: Were reviewed DVT Prophylaxis - Lovenox, TED hose and Sequential compression Weight-Bearing as tolerated to left leg Patient is to have a bowel movement  prior to being discharged Staples will need to be removed 2 weeks postop and apply benzoin and half-inch Steri-Strips Patient will need follow-up in [facility] in 6 weeks. D/C O2 and Pulse OX and try on Room [place] PA-C Wendell 01/19/2015, 8:04 AM

## 2015-01-19 NOTE — Care Management Important Message (Signed)
Important Message  Patient Details  Name: Samanvi Cuccia MRN: [id number] Date of Birth: 1925/08/25   Medicare Important Message Given:  Yes-fourth notification given    Marshell Garfinkel, RN 01/19/2015, 11:23 AM

## 2015-01-19 NOTE — Telephone Encounter (Signed)
[facility] Sim[facility] called to request a new referral be add for post op left hip fx. Please advise/msn

## 2015-01-19 NOTE — Progress Notes (Signed)
Physical Therapy Treatment Patient Details Name: Jenna Marshall MRN: [id number] DOB: [birth year] Today's Date: 01/19/2015    History of Present Illness Jenna Marshall is a 81 y.o. female with a known history of coronary artery disease with CABG 3 months ago. She presents after hitting her toe on a chair and a fall. In the ER, she was found to have a left femoral neck fracture. pt underwent L hip ORIF 01/14/15. WBAT    PT Comments    Pt sleeping initially, but easily awoken. Pt with improved overall affect today and alertness.  Continues with complains of nausea and L back pain where it is noted to be bruised from her fall. Pt education on need to move/use muscles to prevent further weakness, and improve stiffness/pain. Pt notes she understands. Pt demonstrated improved ability in all bed mobility and transfers with less assist required. Also able to demonstrate several steps to chair versus pivot transfer. Pt congratulated and provided encouragement to continue the great efforts. At this time, pt has discharge orders to skilled nursing facility today.   Follow Up Recommendations  SNF     Equipment Recommendations       Recommendations for Other Services       Precautions / Restrictions Precautions Precautions: Fall;Posterior Hip Restrictions Weight Bearing Restrictions: Yes LLE Weight Bearing: Weight bearing as tolerated    Mobility  Bed Mobility Overal bed mobility: Needs Assistance Bed Mobility: Supine to Sit     Supine to sit: Mod assist        Transfers Overall transfer level: Needs assistance Equipment used: Rolling walker (2 wheeled) Transfers: Sit to/from Stand Sit to Stand: Mod assist         General transfer comment:  (Improved ability to use BLE and activate quads/glutes)  Ambulation/Gait Ambulation/Gait assistance: Mod assist Ambulation Distance (Feet): 3 Feet Assistive device: Rolling walker (2 wheeled) Gait Pattern/deviations: Step-to  pattern;Antalgic;Decreased stance time - left;Decreased weight shift to left Gait velocity: decreased significantly Gait velocity interpretation: <1.8 ft/sec, indicative of risk for recurrent falls General Gait Details: Able to demonstrate several steps to the chair. Continues with difficulty with upright posture, but improved   Stairs            Wheelchair Mobility    Modified Rankin (Stroke Patients Only)       Balance Overall balance assessment: Needs assistance Sitting-balance support: No upper extremity supported Sitting balance-Leahy Scale: Fair   Postural control: Other (comment) (forward flexed) Standing balance support: Bilateral upper extremity supported Standing balance-Leahy Scale: Poor                      Cognition Arousal/Alertness: Awake/alert Behavior During Therapy: WFL for tasks assessed/performed Overall Cognitive Status: Within Functional Limits for tasks assessed       Memory: Decreased recall of precautions Following Commands: Follows one step commands inconsistently     Problem Solving: Requires verbal cues;Requires tactile cues (Apprehension vs slow processing. Says "wait" before task)      Exercises Total Joint Exercises Ankle Circles/Pumps: AROM;Both;20 reps;Seated Quad Sets: Strengthening;Both;20 reps;Seated Gluteal Sets: Strengthening;Both;20 reps;Seated Towel Squeeze: Strengthening;Both;20 reps;Seated Short Arc Quad: AAROM;Both;20 reps;Seated Heel Slides: AAROM;Both;20 reps;Seated Hip ABduction/ADduction: AAROM;Both;20 reps;Seated    General Comments        Pertinent Vitals/Pain Pain Assessment: 0-10 Pain Score: 4  Pain Location: L back; denies in hip Pain Descriptors / Indicators: Constant;Aching (grabbing) Pain Intervention(s): Repositioned;Other (comment) (educated on moving, stretching,)    Home Living  Prior Function            PT Goals (current goals can now be found in the  care plan section) Progress towards PT goals: Progressing toward goals    Frequency  BID    PT Plan Current plan remains appropriate    Co-evaluation             End of Session Equipment Utilized During Treatment: Gait belt Activity Tolerance: Patient tolerated treatment well (limited by weakness/nausea) Patient left: in chair;with call bell/phone within reach;with chair alarm set;with nursing/sitter in room     Time: 4103-0131 PT Time Calculation (min) (ACUTE ONLY): 27 min  Charges:  $Gait Training: 8-22 mins $Therapeutic Exercise: 8-22 mins                    G Codes:      Charlaine Dalton 01/19/2015, 11:10 AM

## 2015-01-19 NOTE — Progress Notes (Signed)
Discharge with EMS staff via stretcher.

## 2015-01-19 NOTE — Progress Notes (Signed)
Patient is medically stable for D/C to [facility] today. Per Kim admissions coordinator at [facility] patient is going to room 223. RN will call report at [phone] and arrange EMS for transport. Clinical Education officer, museum (CSW) prepared D/C packet and sent D/C Summary to [employer] via carefinder. [facility] authorization has been received. Auth # [id number]. Per [employer] case manager patient will be in her co-pay days, which is $160 per day. Patient's daughter Juliann Pulse was at bedside and aware of above. Please reconsult if future social work needs arise. CSW signing off.   Blima Rich, [place] [phone]

## 2015-01-19 NOTE — Clinical Social Work Placement (Signed)
   CLINICAL SOCIAL WORK PLACEMENT  NOTE  Date:  01/19/2015  Patient Details  Name: Jenna Marshall MRN: [id number] Date of Birth: 11-02-1925  Clinical Social Work is seeking post-discharge placement for this patient at the [place] level of care (*CSW will initial, date and re-position this form in  chart as items are completed):  Yes   Patient/family provided with [place] Work Department's list of facilities offering this level of care within the geographic area requested by the patient (or if unable, by the patient's family).  Yes   Patient/family informed of their freedom to choose among providers that offer the needed level of care, that participate in Medicare, Medicaid or managed care program needed by the patient, have an available bed and are willing to accept the patient.  Yes   Patient/family informed of [facility]'s ownership interest in [facility] and [facility], as well as of the fact that they are under no obligation to receive care at these facilities.  PASRR submitted to EDS on       PASRR number received on       Existing PASRR number confirmed on 01/15/15     FL2 transmitted to all facilities in geographic area requested by pt/family on 01/15/15     FL2 transmitted to all facilities within larger geographic area on       Patient informed that his/her managed care company has contracts with or will negotiate with certain facilities, including the following:        Yes   Patient/family informed of bed offers received.  Patient chooses bed at  [facility])     Physician recommends and patient chooses bed at      Patient to be transferred to  [facility]) on 01/19/15.  Patient to be transferred to facility by  [facility] )     Patient family notified on 01/19/15 of transfer.  Name of family member notified:   (Daughter Juliann Pulse was at bedside and aware of above. )     PHYSICIAN       Additional  Comment:    _______________________________________________ Loralyn Freshwater, LCSW 01/19/2015, 3:36 PM

## 2015-01-19 NOTE — Telephone Encounter (Signed)
HFU- left hip fx, d/c 06/20/14. Please advise where to add to schedule/msn

## 2015-01-20 ENCOUNTER — Other Ambulatory Visit
Admission: RE | Admit: 2015-01-20 | Discharge: 2015-01-20 | Disposition: A | Discharge status: Home / Self Care | Payer: Commercial Managed Care - HMO | Source: Ambulatory Visit | Attending: Internal Medicine | Admitting: Internal Medicine

## 2015-01-20 DIAGNOSIS — I2510 Atherosclerotic heart disease of native coronary artery without angina pectoris: Secondary | ICD-10-CM | POA: Diagnosis not present

## 2015-01-20 DIAGNOSIS — R410 Disorientation, unspecified: Secondary | ICD-10-CM | POA: Insufficient documentation

## 2015-01-20 DIAGNOSIS — R5383 Other fatigue: Secondary | ICD-10-CM | POA: Diagnosis not present

## 2015-01-20 DIAGNOSIS — M791 Myalgia: Secondary | ICD-10-CM | POA: Diagnosis not present

## 2015-01-20 DIAGNOSIS — I25810 Atherosclerosis of coronary artery bypass graft(s) without angina pectoris: Secondary | ICD-10-CM | POA: Diagnosis not present

## 2015-01-20 DIAGNOSIS — R32 Unspecified urinary incontinence: Secondary | ICD-10-CM | POA: Diagnosis not present

## 2015-01-20 DIAGNOSIS — I509 Heart failure, unspecified: Secondary | ICD-10-CM | POA: Diagnosis not present

## 2015-01-20 DIAGNOSIS — R400 Somnolence: Secondary | ICD-10-CM | POA: Insufficient documentation

## 2015-01-20 DIAGNOSIS — M810 Age-related osteoporosis without current pathological fracture: Secondary | ICD-10-CM | POA: Diagnosis not present

## 2015-01-20 DIAGNOSIS — R42 Dizziness and giddiness: Secondary | ICD-10-CM | POA: Diagnosis not present

## 2015-01-20 LAB — URINALYSIS COMPLETE WITH MICROSCOPIC (ARMC ONLY)
BILIRUBIN URINE: NEGATIVE
Bacteria, UA: NONE SEEN
GLUCOSE, UA: NEGATIVE mg/dL
HGB URINE DIPSTICK: NEGATIVE
Ketones, ur: NEGATIVE mg/dL
LEUKOCYTES UA: NEGATIVE
NITRITE: NEGATIVE
Protein, ur: 30 mg/dL — AB
SPECIFIC GRAVITY, URINE: 1.019 (ref 1.005–1.030)
pH: 7 (ref 5.0–8.0)

## 2015-01-21 LAB — GLUCOSE, CAPILLARY: Glucose-Capillary: 136 mg/dL — ABNORMAL HIGH (ref 65–99)

## 2015-01-22 LAB — CBC WITH DIFFERENTIAL/PLATELET
Basophils Absolute: 0.1 10*3/uL (ref 0–0.1)
Basophils Relative: 1 %
EOS ABS: 0.1 10*3/uL (ref 0–0.7)
Eosinophils Relative: 1 %
HCT: 26.5 % — ABNORMAL LOW (ref 35.0–47.0)
HEMOGLOBIN: 8.9 g/dL — AB (ref 12.0–16.0)
LYMPHS ABS: 1.4 10*3/uL (ref 1.0–3.6)
LYMPHS PCT: 9 %
MCH: 31.4 pg (ref 26.0–34.0)
MCHC: 33.5 g/dL (ref 32.0–36.0)
MCV: 93.6 fL (ref 80.0–100.0)
MONOS PCT: 9 %
Monocytes Absolute: 1.3 10*3/uL — ABNORMAL HIGH (ref 0.2–0.9)
NEUTROS PCT: 80 %
Neutro Abs: 12.4 10*3/uL — ABNORMAL HIGH (ref 1.4–6.5)
Platelets: 287 10*3/uL (ref 150–440)
RBC: 2.83 MIL/uL — ABNORMAL LOW (ref 3.80–5.20)
RDW: 14.8 % — ABNORMAL HIGH (ref 11.5–14.5)
WBC: 15.3 10*3/uL — ABNORMAL HIGH (ref 3.6–11.0)

## 2015-01-22 LAB — BASIC METABOLIC PANEL
Anion gap: 10 (ref 5–15)
BUN: 27 mg/dL — AB (ref 6–20)
CHLORIDE: 90 mmol/L — AB (ref 101–111)
CO2: 32 mmol/L (ref 22–32)
CREATININE: 0.99 mg/dL (ref 0.44–1.00)
Calcium: 8.4 mg/dL — ABNORMAL LOW (ref 8.9–10.3)
GFR calc Af Amer: 58 mL/min — ABNORMAL LOW (ref >60)
GFR calc non Af Amer: 50 mL/min — ABNORMAL LOW (ref >60)
GLUCOSE: 127 mg/dL — AB (ref 65–99)
POTASSIUM: 4.5 mmol/L (ref 3.5–5.1)
SODIUM: 132 mmol/L — AB (ref 135–145)

## 2015-01-22 LAB — URINE CULTURE

## 2015-01-23 ENCOUNTER — Other Ambulatory Visit
Admission: RE | Admit: 2015-01-23 | Discharge: 2015-01-23 | Disposition: A | Discharge status: Home / Self Care | Payer: Commercial Managed Care - HMO | Attending: Gerontology | Admitting: Gerontology

## 2015-01-23 ENCOUNTER — Other Ambulatory Visit: Payer: Self-pay | Admitting: Gerontology

## 2015-01-23 ENCOUNTER — Telehealth: Payer: Self-pay

## 2015-01-23 DIAGNOSIS — E871 Hypo-osmolality and hyponatremia: Secondary | ICD-10-CM | POA: Insufficient documentation

## 2015-01-23 DIAGNOSIS — M7989 Other specified soft tissue disorders: Secondary | ICD-10-CM

## 2015-01-23 DIAGNOSIS — R410 Disorientation, unspecified: Secondary | ICD-10-CM

## 2015-01-23 LAB — COMPREHENSIVE METABOLIC PANEL
ALT: 109 U/L — ABNORMAL HIGH (ref 14–54)
AST: 139 U/L — AB (ref 15–41)
Albumin: 2.5 g/dL — ABNORMAL LOW (ref 3.5–5.0)
Alkaline Phosphatase: 157 U/L — ABNORMAL HIGH (ref 38–126)
Anion gap: 12 (ref 5–15)
BILIRUBIN TOTAL: 0.7 mg/dL (ref 0.3–1.2)
BUN: 33 mg/dL — AB (ref 6–20)
CHLORIDE: 89 mmol/L — AB (ref 101–111)
CO2: 30 mmol/L (ref 22–32)
CREATININE: 1.03 mg/dL — AB (ref 0.44–1.00)
Calcium: 8.2 mg/dL — ABNORMAL LOW (ref 8.9–10.3)
GFR, EST AFRICAN AMERICAN: 55 mL/min — AB (ref >60)
GFR, EST NON AFRICAN AMERICAN: 47 mL/min — AB (ref >60)
Glucose, Bld: 150 mg/dL — ABNORMAL HIGH (ref 65–99)
POTASSIUM: 4.8 mmol/L (ref 3.5–5.1)
Sodium: 131 mmol/L — ABNORMAL LOW (ref 135–145)
TOTAL PROTEIN: 6.4 g/dL — AB (ref 6.5–8.1)

## 2015-01-23 LAB — URINALYSIS COMPLETE WITH MICROSCOPIC (ARMC ONLY)
BILIRUBIN URINE: NEGATIVE
GLUCOSE, UA: NEGATIVE mg/dL
Hgb urine dipstick: NEGATIVE
Ketones, ur: NEGATIVE mg/dL
Nitrite: NEGATIVE
Protein, ur: 100 mg/dL — AB
Specific Gravity, Urine: 1.01 (ref 1.005–1.030)
pH: 9 — ABNORMAL HIGH (ref 5.0–8.0)

## 2015-01-23 LAB — MAGNESIUM: MAGNESIUM: 2.4 mg/dL (ref 1.7–2.4)

## 2015-01-23 LAB — CBC WITH DIFFERENTIAL/PLATELET
Basophils Absolute: 0.1 10*3/uL (ref 0–0.1)
Basophils Relative: 0 %
EOS ABS: 0.1 10*3/uL (ref 0–0.7)
EOS PCT: 0 %
HCT: 25.8 % — ABNORMAL LOW (ref 35.0–47.0)
Hemoglobin: 8.6 g/dL — ABNORMAL LOW (ref 12.0–16.0)
LYMPHS ABS: 1.1 10*3/uL (ref 1.0–3.6)
LYMPHS PCT: 6 %
MCH: 31.1 pg (ref 26.0–34.0)
MCHC: 33.1 g/dL (ref 32.0–36.0)
MCV: 93.9 fL (ref 80.0–100.0)
MONO ABS: 1.2 10*3/uL — AB (ref 0.2–0.9)
MONOS PCT: 7 %
Neutro Abs: 15.4 10*3/uL — ABNORMAL HIGH (ref 1.4–6.5)
Neutrophils Relative %: 87 %
PLATELETS: 302 10*3/uL (ref 150–440)
RBC: 2.75 MIL/uL — ABNORMAL LOW (ref 3.80–5.20)
RDW: 15.3 % — ABNORMAL HIGH (ref 11.5–14.5)
WBC: 17.9 10*3/uL — ABNORMAL HIGH (ref 3.6–11.0)

## 2015-01-23 LAB — AMMONIA: Ammonia: 38 umol/L — ABNORMAL HIGH (ref 9–35)

## 2015-01-23 LAB — TSH: TSH: 2.626 u[IU]/mL (ref 0.350–4.500)

## 2015-01-23 NOTE — Telephone Encounter (Signed)
Transition Care Management Follow-up Telephone Call  Left a Message to return my call.  On 01/23/15 at 1552. Thanks   How have you been since you were released from the hospital?    Do you understand why you were in the hospital?    Do you understand the discharge instrcutions?   Items Reviewed:  Medications reviewed:   Allergies reviewed:   Dietary changes reviewed:   Referrals reviewed:    Functional Questionnaire:   Activities of Daily Living (ADLs):   She states they are independent in the following:  States they require assistance with the following:    Any transportation issues/concerns?:    Any patient concerns?    Confirmed importance and date/time of follow-up visits scheduled:    Confirmed with patient if condition begins to worsen call PCP or go to the ER.  Patient was given the Call-a-Nurse line [phone]:

## 2015-01-24 ENCOUNTER — Telehealth: Payer: Self-pay

## 2015-01-24 LAB — VITAMIN B12: VITAMIN B 12: 707 pg/mL (ref 180–914)

## 2015-01-24 NOTE — Telephone Encounter (Signed)
Patient discharged 01/19/15. TCM call attempt completed by RN.  Left a message to return a call to the office to schedule a HFU with PCP.  Appointment can be made 2 weeks out from discharge date per discharge summary.

## 2015-01-25 LAB — URINE CULTURE: Culture: >=100000

## 2015-01-26 ENCOUNTER — Other Ambulatory Visit: Payer: Self-pay | Admitting: *Deleted

## 2015-01-26 NOTE — Patient Outreach (Addendum)
[facility]) Care Management  [facility] Social Work  01/26/2015  Shai Rasmussen June 07, 1925 [id number]  Subjective:    Objective:   Current Medications:  Current Outpatient Prescriptions  Medication Sig Dispense Refill  . acetaminophen (TYLENOL) 325 MG tablet Take 2 tablets (650 mg total) by mouth every 6 (six) hours as needed for mild pain. (Patient taking differently: Take 325-650 mg by mouth every 6 (six) hours as needed for mild pain. )    . aspirin 81 MG chewable tablet Chew 1 tablet (81 mg total) by mouth daily. 30 tablet 0  . atorvastatin (LIPITOR) 40 MG tablet Take 1 tablet (40 mg total) by mouth daily. 30 tablet 3  . Calcium Carbonate-Vitamin D (CALCIUM + D) 600-200 MG-UNIT TABS Take 1 tablet by mouth 2 (two) times daily.     . carvedilol (COREG) 3.125 MG tablet Take 3.125 mg by mouth 2 (two) times daily.    Marland Kitchen enoxaparin (LOVENOX) 40 MG/0.4ML injection Inject 0.4 mLs (40 mg total) into the skin daily. 14 Syringe 0  . ferrous sulfate 325 (65 FE) MG tablet Take 1 tablet (325 mg total) by mouth 2 (two) times daily with a meal. 30 tablet 3  . furosemide (LASIX) 40 MG tablet Take 0.5 tablets (20 mg total) by mouth daily. 15 tablet 0  . levothyroxine (SYNTHROID, LEVOTHROID) 25 MCG tablet Take 25 mcg by mouth daily before breakfast.    . Multiple Vitamin (MULTIVITAMIN WITH MINERALS) TABS tablet Take 1 tablet by mouth daily.    . Multiple Vitamins-Minerals (PRESERVISION AREDS 2) CAPS Take 2 capsules by mouth daily.    Marland Kitchen oxyCODONE (OXY IR/ROXICODONE) 5 MG immediate release tablet Take 1 tablet (5 mg total) by mouth every 4 (four) hours as needed for severe pain or breakthrough pain ((for SEVERE breakthrough pain)). 20 tablet 0  . pantoprazole (PROTONIX) 40 MG tablet Take 1 tablet (40 mg total) by mouth daily. 30 tablet 3  . senna-docusate (SENOKOT-S) 8.6-50 MG per tablet Take 1 tablet by mouth 2 (two) times daily. 30 tablet 0  . traMADol (ULTRAM) 50 MG tablet Take 1-2  tablets (50-100 mg total) by mouth every 6 (six) hours as needed for moderate pain. 20 tablet 0  . triamcinolone cream (KENALOG) 0.1 % Apply 1 application topically 2 (two) times daily. (Patient taking differently: Apply 1 application topically 2 (two) times daily as needed (for rash). ) 45 g 0  . [DISCONTINUED] citalopram (CELEXA) 20 MG tablet Take 20 mg by mouth daily.       No current facility-administered medications for this visit.    Functional Status:  In your present state of health, do you have any difficulty performing the following activities: 01/13/2015 12/01/2014  Hearing? Y N  Vision? N N  Difficulty concentrating or making decisions? N N  Walking or climbing stairs? Y Y  Dressing or bathing? Y Y  Doing errands, shopping? Tempie Donning  Preparing Food and eating ? - Y  Using the Toilet? - N  In the past six months, have you accidently leaked urine? - Y  Do you have problems with loss of bowel control? - N  Managing your Medications? - Y  Managing your Finances? - Y  Housekeeping or managing your Housekeeping? - Y    Fall/Depression Screening:  PHQ 2/9 Scores 01/26/2015 12/01/2014 11/01/2014 09/29/2014  PHQ - 2 Score 0 0 0 0    Assessment: Patient visited at [place] facility.  Per patient, she fell at home and  fractured her hip.  Patient described active participation with both physical and occupational therapy stating that she feels stronger every day. Per patient  no discharge date set yet.  Patient's brother at bedside during the visit. Patient continues to describe a supportive family unit.  Patient out of bed and sitting in her wheelchair when this social worker arrived. Patient appeared to be in good spirits.  Plan: This Education officer, museum will follow up with patient's discharge planner next week regarding patient's tentative discharge date and follow up care.  Social work note to be sent to patient's primary care provider.    [facility] CM Care Plan Problem One         Most Recent Value   Care Plan Problem One  patient currently in Skilled nursing facility   Role Documenting the Problem One  [place] for Problem One  Active   [facility] CM Short Term Goal #1 (0-30 days)  Patient will have all discharge planning needs met with the next 30 days in preparation for discharge home   [facility] CM Short Term Goal #1 Start Date  01/26/15   Interventions for Short Term Goal #1  patient encouraged to discuss tentative discharge date with discharge planner   [facility] CM Short Term Goal #2 (0-30 days)  patient to actively participate in physical and occupational therapy within the next 30 days in preparation for discharge home   [facility] CM Short Term Goal #2 Start Date  01/26/15   Interventions for Short Term Goal #2  patient encouraged continued participation in daily physical and occupational therapy     [name] [facility] Care Management [phone]

## 2015-02-02 ENCOUNTER — Encounter
Admission: RE | Admit: 2015-02-02 | Discharge: 2015-02-02 | Disposition: A | Discharge status: Home / Self Care | Payer: Commercial Managed Care - HMO | Source: Ambulatory Visit | Attending: Internal Medicine | Admitting: Internal Medicine

## 2015-02-02 ENCOUNTER — Ambulatory Visit: Payer: Self-pay | Admitting: *Deleted

## 2015-02-02 DIAGNOSIS — R410 Disorientation, unspecified: Secondary | ICD-10-CM | POA: Insufficient documentation

## 2015-02-03 ENCOUNTER — Other Ambulatory Visit: Payer: Self-pay | Admitting: *Deleted

## 2015-02-03 NOTE — Patient Outreach (Signed)
[place]) Care Management  02/03/2015  Jenna Marshall 08/23/1925 [id number]  Phone call to [facility]. Left voicemail message for Desiree Hane, discharge planner to discuss patient's discharge planning needs.   [name] [facility] Care Management [phone]

## 2015-02-03 NOTE — Patient Outreach (Signed)
[facility]) Care Management  02/03/2015  Jenna Marshall [birth year] [id number]   Phone call to patient's daughter to follow up on tentative discharge plan from the skilled nursing facility.  Left voicemail message for a return call.   [name] [facility] Care Management [phone]

## 2015-02-03 NOTE — Patient Outreach (Signed)
[place] [facility]) Care Management  02/03/2015  Jenna Marshall [birth year] [id number]   Return call from [place], discharge planner at [facility].  Per Jenna Marshall, patient's discharge date will be 02/24/15, however due to her positive progress, patient may discharge before that.  Patient to discharge with Home Health through [place].  Care planning meeting took place on 02/01/15.  Patient's family in attendance.  Patient still needs moderate assistance with mobility.  Patient's family supportive, will work on removing safety risks in the home to avoid falls.  This Education officer, museum will follow up next week.   [name] [facility] Care Management [phone]

## 2015-02-07 ENCOUNTER — Other Ambulatory Visit: Payer: Self-pay | Admitting: *Deleted

## 2015-02-07 NOTE — Patient Outreach (Signed)
[place]) Care Management  02/07/2015  Merritt Kibby March 27, 1926 [id number]   Return phone call from patient's daughter confirming patient's discharge for 02/24/15(tentatively).  Per patient's daughter, she will return home with home health and a personal care aid that she has arranged.  Per patient's daughter, patient is progressing well in rehabilitation.   [name] [facility] Care Management [phone]

## 2015-02-08 ENCOUNTER — Other Ambulatory Visit
Admission: RE | Admit: 2015-02-08 | Discharge: 2015-02-08 | Disposition: A | Discharge status: Home / Self Care | Payer: Commercial Managed Care - HMO | Attending: Internal Medicine | Admitting: Internal Medicine

## 2015-02-08 DIAGNOSIS — R410 Disorientation, unspecified: Secondary | ICD-10-CM | POA: Insufficient documentation

## 2015-02-08 LAB — URINALYSIS COMPLETE WITH MICROSCOPIC (ARMC ONLY)
BILIRUBIN URINE: NEGATIVE
Bacteria, UA: NONE SEEN
Glucose, UA: NEGATIVE mg/dL
HGB URINE DIPSTICK: NEGATIVE
Ketones, ur: NEGATIVE mg/dL
Leukocytes, UA: NEGATIVE
Nitrite: NEGATIVE
PH: 5 (ref 5.0–8.0)
PROTEIN: NEGATIVE mg/dL
RBC / HPF: NONE SEEN RBC/hpf (ref 0–5)
SQUAMOUS EPITHELIAL / LPF: NONE SEEN
Specific Gravity, Urine: 1.011 (ref 1.005–1.030)

## 2015-02-09 DIAGNOSIS — R410 Disorientation, unspecified: Secondary | ICD-10-CM | POA: Diagnosis not present

## 2015-02-10 LAB — URINE CULTURE: CULTURE: NO GROWTH

## 2015-02-10 LAB — COMPREHENSIVE METABOLIC PANEL
ALBUMIN: 2.8 g/dL — AB (ref 3.5–5.0)
ALK PHOS: 79 U/L (ref 38–126)
ALT: 14 U/L (ref 14–54)
AST: 19 U/L (ref 15–41)
Anion gap: 8 (ref 5–15)
BUN: 25 mg/dL — ABNORMAL HIGH (ref 6–20)
CALCIUM: 8.9 mg/dL (ref 8.9–10.3)
CO2: 30 mmol/L (ref 22–32)
CREATININE: 0.79 mg/dL (ref 0.44–1.00)
Chloride: 101 mmol/L (ref 101–111)
GFR calc Af Amer: >60 mL/min (ref >60)
GFR calc non Af Amer: >60 mL/min (ref >60)
GLUCOSE: 105 mg/dL — AB (ref 65–99)
Potassium: 3.8 mmol/L (ref 3.5–5.1)
SODIUM: 139 mmol/L (ref 135–145)
Total Bilirubin: 0.4 mg/dL (ref 0.3–1.2)
Total Protein: 5.8 g/dL — ABNORMAL LOW (ref 6.5–8.1)

## 2015-02-10 LAB — CBC WITH DIFFERENTIAL/PLATELET
BASOS ABS: 0 10*3/uL (ref 0–0.1)
BASOS PCT: 1 %
EOS ABS: 0.2 10*3/uL (ref 0–0.7)
Eosinophils Relative: 4 %
HCT: 26.6 % — ABNORMAL LOW (ref 35.0–47.0)
HEMOGLOBIN: 8.9 g/dL — AB (ref 12.0–16.0)
LYMPHS ABS: 1.4 10*3/uL (ref 1.0–3.6)
Lymphocytes Relative: 25 %
MCH: 31.7 pg (ref 26.0–34.0)
MCHC: 33.3 g/dL (ref 32.0–36.0)
MCV: 95.3 fL (ref 80.0–100.0)
Monocytes Absolute: 0.6 10*3/uL (ref 0.2–0.9)
Monocytes Relative: 10 %
NEUTROS PCT: 60 %
Neutro Abs: 3.5 10*3/uL (ref 1.4–6.5)
Platelets: 181 10*3/uL (ref 150–440)
RBC: 2.79 MIL/uL — AB (ref 3.80–5.20)
RDW: 16.9 % — ABNORMAL HIGH (ref 11.5–14.5)
WBC: 5.8 10*3/uL (ref 3.6–11.0)

## 2015-02-16 DIAGNOSIS — M810 Age-related osteoporosis without current pathological fracture: Secondary | ICD-10-CM | POA: Diagnosis not present

## 2015-02-16 DIAGNOSIS — I10 Essential (primary) hypertension: Secondary | ICD-10-CM | POA: Diagnosis not present

## 2015-02-19 DIAGNOSIS — Z96642 Presence of left artificial hip joint: Secondary | ICD-10-CM | POA: Diagnosis not present

## 2015-02-19 DIAGNOSIS — I2510 Atherosclerotic heart disease of native coronary artery without angina pectoris: Secondary | ICD-10-CM | POA: Diagnosis not present

## 2015-02-19 DIAGNOSIS — K219 Gastro-esophageal reflux disease without esophagitis: Secondary | ICD-10-CM | POA: Diagnosis not present

## 2015-02-19 DIAGNOSIS — D510 Vitamin B12 deficiency anemia due to intrinsic factor deficiency: Secondary | ICD-10-CM | POA: Diagnosis not present

## 2015-02-19 DIAGNOSIS — Z951 Presence of aortocoronary bypass graft: Secondary | ICD-10-CM | POA: Diagnosis not present

## 2015-02-19 DIAGNOSIS — S72002D Fracture of unspecified part of neck of left femur, subsequent encounter for closed fracture with routine healing: Secondary | ICD-10-CM | POA: Diagnosis not present

## 2015-02-19 DIAGNOSIS — I10 Essential (primary) hypertension: Secondary | ICD-10-CM | POA: Diagnosis not present

## 2015-02-19 DIAGNOSIS — I5032 Chronic diastolic (congestive) heart failure: Secondary | ICD-10-CM | POA: Diagnosis not present

## 2015-02-20 ENCOUNTER — Telehealth: Payer: Self-pay | Admitting: *Deleted

## 2015-02-20 NOTE — Telephone Encounter (Signed)
No report of patient discharged yet.  Will continue to follow.

## 2015-02-20 NOTE — Telephone Encounter (Signed)
Patient had a hospital D/C from the hospital on 02/20/15, please advise were to place patient on Dr. Lupita Dawn schedule this week -thanks

## 2015-02-21 ENCOUNTER — Telehealth: Payer: Self-pay

## 2015-02-21 DIAGNOSIS — Z96642 Presence of left artificial hip joint: Secondary | ICD-10-CM | POA: Diagnosis not present

## 2015-02-21 DIAGNOSIS — I2510 Atherosclerotic heart disease of native coronary artery without angina pectoris: Secondary | ICD-10-CM | POA: Diagnosis not present

## 2015-02-21 DIAGNOSIS — S72002D Fracture of unspecified part of neck of left femur, subsequent encounter for closed fracture with routine healing: Secondary | ICD-10-CM | POA: Diagnosis not present

## 2015-02-21 DIAGNOSIS — Z951 Presence of aortocoronary bypass graft: Secondary | ICD-10-CM | POA: Diagnosis not present

## 2015-02-21 DIAGNOSIS — D510 Vitamin B12 deficiency anemia due to intrinsic factor deficiency: Secondary | ICD-10-CM | POA: Diagnosis not present

## 2015-02-21 DIAGNOSIS — K219 Gastro-esophageal reflux disease without esophagitis: Secondary | ICD-10-CM | POA: Diagnosis not present

## 2015-02-21 DIAGNOSIS — I5032 Chronic diastolic (congestive) heart failure: Secondary | ICD-10-CM | POA: Diagnosis not present

## 2015-02-21 DIAGNOSIS — I10 Essential (primary) hypertension: Secondary | ICD-10-CM | POA: Diagnosis not present

## 2015-02-21 NOTE — Telephone Encounter (Signed)
Patient was discharged from SNF to home 02/18/15.  TCM call and appointment made.

## 2015-02-21 NOTE — Telephone Encounter (Signed)
Transition Care Management Follow-up Telephone Call   Date discharged?02/18/15   How have you been since you were released from the hospital? Very well!  I am doing everything they told me to do and I have help around the clock when I need it.   Do you understand why you were in the hospital? Yes   Do you understand the discharge instructions? Yes, daughter is a nurse who helps clarify things if needed.   Where were you discharged to? Home   Items Reviewed:  Medications reviewed: Yes  Allergies reviewed: Yes  Dietary changes reviewed: Yes  Referrals reviewed: Yes   Functional Questionnaire:   Activities of Daily Living (ADLs):   She states they are independent in the following: Grooming, toileting, self feeding. States they require assistance with the following: Ambulates with walker, meals prepared and some dressing/bathing with assistance from home health nursing agency.   Any transportation issues/concerns?: No, daughter will drive everywhere I need to go.   Any patient concerns? Not at this time.   Confirmed importance and date/time of follow-up visits scheduled Yes, daughter to call back and make an appointment as soon as possible.  Provider Appointment will be booked with Dr. Derrel Nip (PCP).  Confirmed with patient if condition begins to worsen call PCP or go to the ER.  Patient was given the office number and encouraged to call back with question or concerns.  : Yes, patient verbalized understanding.

## 2015-02-22 DIAGNOSIS — I10 Essential (primary) hypertension: Secondary | ICD-10-CM | POA: Diagnosis not present

## 2015-02-22 DIAGNOSIS — I5032 Chronic diastolic (congestive) heart failure: Secondary | ICD-10-CM | POA: Diagnosis not present

## 2015-02-22 DIAGNOSIS — Z951 Presence of aortocoronary bypass graft: Secondary | ICD-10-CM | POA: Diagnosis not present

## 2015-02-22 DIAGNOSIS — S72002D Fracture of unspecified part of neck of left femur, subsequent encounter for closed fracture with routine healing: Secondary | ICD-10-CM | POA: Diagnosis not present

## 2015-02-22 DIAGNOSIS — D510 Vitamin B12 deficiency anemia due to intrinsic factor deficiency: Secondary | ICD-10-CM | POA: Diagnosis not present

## 2015-02-22 DIAGNOSIS — Z96642 Presence of left artificial hip joint: Secondary | ICD-10-CM | POA: Diagnosis not present

## 2015-02-22 DIAGNOSIS — K219 Gastro-esophageal reflux disease without esophagitis: Secondary | ICD-10-CM | POA: Diagnosis not present

## 2015-02-22 DIAGNOSIS — I2510 Atherosclerotic heart disease of native coronary artery without angina pectoris: Secondary | ICD-10-CM | POA: Diagnosis not present

## 2015-02-23 DIAGNOSIS — I2510 Atherosclerotic heart disease of native coronary artery without angina pectoris: Secondary | ICD-10-CM | POA: Diagnosis not present

## 2015-02-23 DIAGNOSIS — I10 Essential (primary) hypertension: Secondary | ICD-10-CM | POA: Diagnosis not present

## 2015-02-23 DIAGNOSIS — D510 Vitamin B12 deficiency anemia due to intrinsic factor deficiency: Secondary | ICD-10-CM | POA: Diagnosis not present

## 2015-02-23 DIAGNOSIS — I5032 Chronic diastolic (congestive) heart failure: Secondary | ICD-10-CM | POA: Diagnosis not present

## 2015-02-23 DIAGNOSIS — Z951 Presence of aortocoronary bypass graft: Secondary | ICD-10-CM | POA: Diagnosis not present

## 2015-02-23 DIAGNOSIS — K219 Gastro-esophageal reflux disease without esophagitis: Secondary | ICD-10-CM | POA: Diagnosis not present

## 2015-02-23 DIAGNOSIS — Z96642 Presence of left artificial hip joint: Secondary | ICD-10-CM | POA: Diagnosis not present

## 2015-02-23 DIAGNOSIS — S72002D Fracture of unspecified part of neck of left femur, subsequent encounter for closed fracture with routine healing: Secondary | ICD-10-CM | POA: Diagnosis not present

## 2015-02-24 DIAGNOSIS — S72002D Fracture of unspecified part of neck of left femur, subsequent encounter for closed fracture with routine healing: Secondary | ICD-10-CM | POA: Diagnosis not present

## 2015-02-24 DIAGNOSIS — Z951 Presence of aortocoronary bypass graft: Secondary | ICD-10-CM | POA: Diagnosis not present

## 2015-02-24 DIAGNOSIS — Z96642 Presence of left artificial hip joint: Secondary | ICD-10-CM | POA: Diagnosis not present

## 2015-02-24 DIAGNOSIS — K219 Gastro-esophageal reflux disease without esophagitis: Secondary | ICD-10-CM | POA: Diagnosis not present

## 2015-02-24 DIAGNOSIS — I2510 Atherosclerotic heart disease of native coronary artery without angina pectoris: Secondary | ICD-10-CM | POA: Diagnosis not present

## 2015-02-24 DIAGNOSIS — I10 Essential (primary) hypertension: Secondary | ICD-10-CM | POA: Diagnosis not present

## 2015-02-24 DIAGNOSIS — I5032 Chronic diastolic (congestive) heart failure: Secondary | ICD-10-CM | POA: Diagnosis not present

## 2015-02-24 DIAGNOSIS — D510 Vitamin B12 deficiency anemia due to intrinsic factor deficiency: Secondary | ICD-10-CM | POA: Diagnosis not present

## 2015-02-27 ENCOUNTER — Other Ambulatory Visit: Payer: Self-pay | Admitting: Internal Medicine

## 2015-02-27 DIAGNOSIS — S72002D Fracture of unspecified part of neck of left femur, subsequent encounter for closed fracture with routine healing: Secondary | ICD-10-CM | POA: Diagnosis not present

## 2015-02-27 DIAGNOSIS — K219 Gastro-esophageal reflux disease without esophagitis: Secondary | ICD-10-CM | POA: Diagnosis not present

## 2015-02-27 DIAGNOSIS — Z96642 Presence of left artificial hip joint: Secondary | ICD-10-CM | POA: Diagnosis not present

## 2015-02-27 DIAGNOSIS — I2510 Atherosclerotic heart disease of native coronary artery without angina pectoris: Secondary | ICD-10-CM | POA: Diagnosis not present

## 2015-02-27 DIAGNOSIS — I5032 Chronic diastolic (congestive) heart failure: Secondary | ICD-10-CM | POA: Diagnosis not present

## 2015-02-27 DIAGNOSIS — Z951 Presence of aortocoronary bypass graft: Secondary | ICD-10-CM | POA: Diagnosis not present

## 2015-02-27 DIAGNOSIS — D510 Vitamin B12 deficiency anemia due to intrinsic factor deficiency: Secondary | ICD-10-CM | POA: Diagnosis not present

## 2015-02-27 DIAGNOSIS — I10 Essential (primary) hypertension: Secondary | ICD-10-CM | POA: Diagnosis not present

## 2015-03-02 ENCOUNTER — Telehealth: Payer: Self-pay | Admitting: Internal Medicine

## 2015-03-02 NOTE — Telephone Encounter (Signed)
Lattie Haw [phone] calling [facility] regarding pt not having a need for skilled nursing just wanted to inform. Pt still has physical therapy. Pt was accessed for medication therapy but pt does not feel she needs that,however daughter does fill up the pill box. Thank You!

## 2015-03-02 NOTE — Telephone Encounter (Signed)
Juliann Pulse,  Dr. Derrel Nip is seeing her tomorrow 03/03/15, Juluis Rainier. Thanks

## 2015-03-03 ENCOUNTER — Ambulatory Visit (INDEPENDENT_AMBULATORY_CARE_PROVIDER_SITE_OTHER): Payer: Commercial Managed Care - HMO | Admitting: Internal Medicine

## 2015-03-03 ENCOUNTER — Other Ambulatory Visit: Payer: Self-pay | Admitting: *Deleted

## 2015-03-03 ENCOUNTER — Encounter: Payer: Self-pay | Admitting: Internal Medicine

## 2015-03-03 VITALS — BP 122/70 | HR 81 | Temp 98.2°F | Wt 178.0 lb

## 2015-03-03 DIAGNOSIS — I5032 Chronic diastolic (congestive) heart failure: Secondary | ICD-10-CM | POA: Diagnosis not present

## 2015-03-03 DIAGNOSIS — E559 Vitamin D deficiency, unspecified: Secondary | ICD-10-CM | POA: Diagnosis not present

## 2015-03-03 DIAGNOSIS — S72142E Displaced intertrochanteric fracture of left femur, subsequent encounter for open fracture type I or II with routine healing: Secondary | ICD-10-CM

## 2015-03-03 DIAGNOSIS — E785 Hyperlipidemia, unspecified: Secondary | ICD-10-CM

## 2015-03-03 DIAGNOSIS — Z96642 Presence of left artificial hip joint: Secondary | ICD-10-CM | POA: Diagnosis not present

## 2015-03-03 DIAGNOSIS — D62 Acute posthemorrhagic anemia: Secondary | ICD-10-CM

## 2015-03-03 DIAGNOSIS — Z951 Presence of aortocoronary bypass graft: Secondary | ICD-10-CM | POA: Diagnosis not present

## 2015-03-03 DIAGNOSIS — M899 Disorder of bone, unspecified: Secondary | ICD-10-CM

## 2015-03-03 DIAGNOSIS — I25700 Atherosclerosis of coronary artery bypass graft(s), unspecified, with unstable angina pectoris: Secondary | ICD-10-CM

## 2015-03-03 DIAGNOSIS — D509 Iron deficiency anemia, unspecified: Secondary | ICD-10-CM

## 2015-03-03 DIAGNOSIS — M8580 Other specified disorders of bone density and structure, unspecified site: Secondary | ICD-10-CM

## 2015-03-03 DIAGNOSIS — K219 Gastro-esophageal reflux disease without esophagitis: Secondary | ICD-10-CM | POA: Diagnosis not present

## 2015-03-03 DIAGNOSIS — S72002D Fracture of unspecified part of neck of left femur, subsequent encounter for closed fracture with routine healing: Secondary | ICD-10-CM | POA: Diagnosis not present

## 2015-03-03 DIAGNOSIS — Z79899 Other long term (current) drug therapy: Secondary | ICD-10-CM

## 2015-03-03 DIAGNOSIS — M546 Pain in thoracic spine: Secondary | ICD-10-CM

## 2015-03-03 DIAGNOSIS — D510 Vitamin B12 deficiency anemia due to intrinsic factor deficiency: Secondary | ICD-10-CM | POA: Diagnosis not present

## 2015-03-03 DIAGNOSIS — I10 Essential (primary) hypertension: Secondary | ICD-10-CM | POA: Diagnosis not present

## 2015-03-03 DIAGNOSIS — I2510 Atherosclerotic heart disease of native coronary artery without angina pectoris: Secondary | ICD-10-CM | POA: Diagnosis not present

## 2015-03-03 LAB — FERRITIN: Ferritin: 65.6 ng/mL (ref 10.0–291.0)

## 2015-03-03 LAB — CBC WITH DIFFERENTIAL/PLATELET
BASOS PCT: 0.5 % (ref 0.0–3.0)
Basophils Absolute: 0 10*3/uL (ref 0.0–0.1)
EOS ABS: 0.1 10*3/uL (ref 0.0–0.7)
Eosinophils Relative: 1.3 % (ref 0.0–5.0)
HEMATOCRIT: 33.9 % — AB (ref 36.0–46.0)
HEMOGLOBIN: 11.1 g/dL — AB (ref 12.0–15.0)
LYMPHS PCT: 13.5 % (ref 12.0–46.0)
Lymphs Abs: 1.2 10*3/uL (ref 0.7–4.0)
MCHC: 32.6 g/dL (ref 30.0–36.0)
MCV: 93.8 fl (ref 78.0–100.0)
Monocytes Absolute: 0.6 10*3/uL (ref 0.1–1.0)
Monocytes Relative: 6.2 % (ref 3.0–12.0)
Neutro Abs: 7.2 10*3/uL (ref 1.4–7.7)
Neutrophils Relative %: 78.5 % — ABNORMAL HIGH (ref 43.0–77.0)
Platelets: 213 10*3/uL (ref 150.0–400.0)
RBC: 3.61 Mil/uL — ABNORMAL LOW (ref 3.87–5.11)
RDW: 17 % — AB (ref 11.5–15.5)
WBC: 9.2 10*3/uL (ref 4.0–10.5)

## 2015-03-03 LAB — COMPREHENSIVE METABOLIC PANEL
ALBUMIN: 3.7 g/dL (ref 3.5–5.2)
ALK PHOS: 68 U/L (ref 39–117)
ALT: 14 U/L (ref 0–35)
AST: 19 U/L (ref 0–37)
BILIRUBIN TOTAL: 0.4 mg/dL (ref 0.2–1.2)
BUN: 30 mg/dL — ABNORMAL HIGH (ref 6–23)
CALCIUM: 9.5 mg/dL (ref 8.4–10.5)
CHLORIDE: 103 meq/L (ref 96–112)
CO2: 28 mEq/L (ref 19–32)
CREATININE: 0.94 mg/dL (ref 0.40–1.20)
GFR: 59.84 mL/min — ABNORMAL LOW (ref >60.00)
Glucose, Bld: 102 mg/dL — ABNORMAL HIGH (ref 70–99)
Potassium: 4.2 mEq/L (ref 3.5–5.1)
Sodium: 142 mEq/L (ref 135–145)
Total Protein: 6.5 g/dL (ref 6.0–8.3)

## 2015-03-03 LAB — IRON AND TIBC
%SAT: 34 % (ref 11–50)
Iron: 73 ug/dL (ref 45–160)
TIBC: 213 ug/dL — ABNORMAL LOW (ref 250–450)
UIBC: 140 ug/dL (ref 125–400)

## 2015-03-03 LAB — VITAMIN D 25 HYDROXY (VIT D DEFICIENCY, FRACTURES): VITD: 33.12 ng/mL (ref 30.00–100.00)

## 2015-03-03 MED ORDER — TRAMADOL HCL 50 MG PO TABS: 50.0000 mg | ORAL_TABLET | Freq: Four times a day (QID) | ORAL | Status: DC | PRN

## 2015-03-03 MED ORDER — MELOXICAM 15 MG PO TABS: 15.0000 mg | ORAL_TABLET | Freq: Every day | ORAL | Status: DC

## 2015-03-03 NOTE — Patient Outreach (Signed)
Unsuccessful outreach for follow up after SNF d/c. HIPPA compliant message left.   Plan: RNCM will await return call  RNCM will attempt outreach again in 2 days.  Rutherford Limerick RN, BSN  [facility] Care Management [phone])

## 2015-03-03 NOTE — Patient Instructions (Addendum)
You can reduce your iron to once daily  Refilling tramadol to use twice daily if needed for pain   Trial of meloxicam once daily for inflammation.  Do not use motrin or Aleve with this ,  Ok to use tylenol

## 2015-03-03 NOTE — Assessment & Plan Note (Signed)
S/p 4 vessel CABG May 2016 by Cyndia Bent, CVTS at [facility].  Normal EF, patient doing well, no chest pain

## 2015-03-03 NOTE — Patient Instructions (Signed)
1 

## 2015-03-03 NOTE — Assessment & Plan Note (Signed)
Taking atorvastatin 40 mg daily . LDL is at goal LFTs have been normal.  Lab Results  Component Value Date   CHOL 114 12/23/2014   HDL 40 12/23/2014   LDLCALC 56 12/23/2014   LDLDIRECT 104.4 02/27/2011   TRIG 88 12/23/2014   CHOLHDL 2.9 12/23/2014   Lab Results  Component Value Date   ALT 14 02/09/2015   AST 19 02/09/2015   ALKPHOS 79 02/09/2015   BILITOT 0.4 02/09/2015

## 2015-03-03 NOTE — Assessment & Plan Note (Addendum)
Agree with PT diagnosis of thoracic muscle spasm as she is nontender on exam and has no history suggestive of rib fracture. Trial of meloxicam, and prn tramadol refilled.

## 2015-03-03 NOTE — Assessment & Plan Note (Signed)
Advised to reduce iron supplement to once daily.  Will check CGC andiron today.  If no improvement consider IV iron Lab Results  Component Value Date   WBC 5.8 02/09/2015   HGB 8.9* 02/09/2015   HCT 26.6* 02/09/2015   MCV 95.3 02/09/2015   PLT 181 02/09/2015

## 2015-03-03 NOTE — Assessment & Plan Note (Signed)
Her T scores have  improved by 2014 bone density test, but she has recently sustained a hip fracture.   I would continue her calcium vitamin D and weightbearing exercise.  DEXA ordered , and Prolia will be advised. Marland Kitchen

## 2015-03-03 NOTE — Progress Notes (Signed)
Subjective:  Patient ID: Jenna Marshall, female    DOB: Aug 18, 1925  Age: 81 y.o. MRN: [id number]  CC: The primary encounter diagnosis was Long-term use of high-risk medication. Diagnoses of Acute blood loss anemia, Iron deficiency anemia, Vitamin D deficiency, Osteopenia of the elderly, Intertrochanteric fracture of left hip, open type I or II, with routine healing, subsequent encounter, Coronary artery disease involving coronary bypass graft of native heart with unstable angina pectoris, Left-sided thoracic back pain, and Hyperlipidemia were also pertinent to this visit.  HPI Clatie Kessen presents for hospital follow up.  Since last visit pateint has undergone a 4 vessel CABG in May by Dr Cyndia Bent  For critical left main stenosis, LAD and RCA stenosis.  Patient recovered well, but was admitted  To [facility] on August 12 with a closed hip fracture and discharged to [facility] on August 18th.  She was discharged home  On Sept 15th.  During hospitalization she  underwent left hemi arthroplasty , sustained a worsening of anemia with a hgb drop from 9.4 to 8 postoperatively that did not fall below 8 so she was not transfused,.  She was treated for acute diastolic chf with hypoxia requiring supplemental 02 and increased lasix which was stopped before discharge.  EF was normal by hospital ECHO.   Marland Kitchen  She feels generally well except for a chronic persistent pain behind left shoujlder blade that is aggravating her .  It was diagnosed as a muscle spasm by her home PT.  Getting home PT twice a week.  furosemide changed to demadex,    Needs tramadol refilled   Taking iron twice daily,  Taking sennakot twice daily for constipation    Outpatient Prescriptions Prior to Visit  Medication Sig Dispense Refill  . acetaminophen (TYLENOL) 325 MG tablet Take 2 tablets (650 mg total) by mouth every 6 (six) hours as needed for mild pain. (Patient taking differently: Take 325-650 mg by mouth every 6 (six) hours as needed for  mild pain. )    . aspirin 81 MG chewable tablet Chew 1 tablet (81 mg total) by mouth daily. 30 tablet 0  . atorvastatin (LIPITOR) 40 MG tablet Take 1 tablet (40 mg total) by mouth daily. 30 tablet 3  . Calcium Carbonate-Vitamin D (CALCIUM + D) 600-200 MG-UNIT TABS Take 1 tablet by mouth 2 (two) times daily.     . ferrous sulfate 325 (65 FE) MG tablet Take 1 tablet (325 mg total) by mouth 2 (two) times daily with a meal. 30 tablet 3  . levothyroxine (SYNTHROID, LEVOTHROID) 25 MCG tablet Take 25 mcg by mouth daily before breakfast.    . Multiple Vitamin (MULTIVITAMIN WITH MINERALS) TABS tablet Take 1 tablet by mouth daily.    . Multiple Vitamins-Minerals (PRESERVISION AREDS 2) CAPS Take 2 capsules by mouth daily.    . pantoprazole (PROTONIX) 40 MG tablet TAKE 1 TABLET BY MOUTH EACH DAY 30 tablet 5  . senna-docusate (SENOKOT-S) 8.6-50 MG per tablet Take 1 tablet by mouth 2 (two) times daily. 30 tablet 0  . triamcinolone cream (KENALOG) 0.1 % Apply 1 application topically 2 (two) times daily. (Patient taking differently: Apply 1 application topically 2 (two) times daily as needed (for rash). ) 45 g 0  . oxyCODONE (OXY IR/ROXICODONE) 5 MG immediate release tablet Take 1 tablet (5 mg total) by mouth every 4 (four) hours as needed for severe pain or breakthrough pain ((for SEVERE breakthrough pain)). 20 tablet 0  . traMADol (ULTRAM) 50 MG tablet  Take 1-2 tablets (50-100 mg total) by mouth every 6 (six) hours as needed for moderate pain. 20 tablet 0  . carvedilol (COREG) 3.125 MG tablet Take 3.125 mg by mouth 2 (two) times daily.    Marland Kitchen enoxaparin (LOVENOX) 40 MG/0.4ML injection Inject 0.4 mLs (40 mg total) into the skin daily. 14 Syringe 0   No facility-administered medications prior to visit.    Review of Systems;  Patient denies headache, fevers, malaise, unintentional weight loss, skin rash, eye pain, sinus congestion and sinus pain, sore throat, dysphagia,  hemoptysis , cough, dyspnea, wheezing,  chest pain, palpitations, orthopnea, edema, abdominal pain, nausea, melena, diarrhea, constipation, flank pain, dysuria, hematuria, urinary  Frequency, nocturia, numbness, tingling, seizures,  Focal weakness, Loss of consciousness,  Tremor, insomnia, depression, anxiety, and suicidal ideation.      Objective:  BP 122/70 mmHg  Pulse 81  Temp(Src) 98.2 F (36.8 C) (Oral)  Wt 178 lb (80.74 kg)  SpO2 92%  BP Readings from Last 3 Encounters:  03/03/15 122/70  01/19/15 110/58  01/02/15 130/78    Wt Readings from Last 3 Encounters:  03/03/15 178 lb (80.74 kg)  01/13/15 179 lb (81.194 kg)  01/02/15 182 lb (82.555 kg)    General appearance: alert, cooperative and appears stated age Ears: normal TM's and external ear canals both ears Throat: lips, mucosa, and tongue normal; teeth and gums normal Neck: no adenopathy, no carotid bruit, supple, symmetrical, trachea midline and thyroid not enlarged, symmetric, no tenderness/mass/nodules Back: symmetric, no curvature. ROM normal. No CVA tenderness. Lungs: clear to auscultation bilaterally Heart: regular rate and rhythm, S1, S2 normal, no murmur, click, rub or gallop Abdomen: soft, non-tender; bowel sounds normal; no masses,  no organomegaly Pulses: 2+ and symmetric Skin: Skin color, texture, turgor normal. No rashes or lesions Lymph nodes: Cervical, supraclavicular, and axillary nodes normal.  No results found for: HGBA1C  Lab Results  Component Value Date   CREATININE 0.79 02/09/2015   CREATININE 1.03* 01/23/2015   CREATININE 0.99 01/22/2015    Lab Results  Component Value Date   WBC 5.8 02/09/2015   HGB 8.9* 02/09/2015   HCT 26.6* 02/09/2015   PLT 181 02/09/2015   GLUCOSE 105* 02/09/2015   CHOL 114 12/23/2014   TRIG 88 12/23/2014   HDL 40 12/23/2014   LDLDIRECT 104.4 02/27/2011   LDLCALC 56 12/23/2014   ALT 14 02/09/2015   AST 19 02/09/2015   NA 139 02/09/2015   K 3.8 02/09/2015   CL 101 02/09/2015   CREATININE 0.79  02/09/2015   BUN 25* 02/09/2015   CO2 30 02/09/2015   TSH 2.626 01/23/2015   INR 1.64* 10/14/2014    No results found.  Assessment & Plan:   Problem List Items Addressed This Visit    Hyperlipidemia    Taking atorvastatin 40 mg daily . LDL is at goal LFTs have been normal.  Lab Results  Component Value Date   CHOL 114 12/23/2014   HDL 40 12/23/2014   LDLCALC 56 12/23/2014   LDLDIRECT 104.4 02/27/2011   TRIG 88 12/23/2014   CHOLHDL 2.9 12/23/2014   Lab Results  Component Value Date   ALT 14 02/09/2015   AST 19 02/09/2015   ALKPHOS 79 02/09/2015   BILITOT 0.4 02/09/2015          Relevant Medications   torsemide (DEMADEX) 10 MG tablet   Osteopenia of the elderly    Her T scores have  improved by 2014 bone density test, but she has recently  sustained a hip fracture.   I would continue her calcium vitamin D and weightbearing exercise.  DEXA ordered , and Prolia will be advised. Marland Kitchen        CAD (coronary artery disease)    S/p 4 vessel CABG May 2016 by Cyndia Bent, CVTS at [facility].  Normal EF, patient doing well, no chest pain       Relevant Medications   torsemide (DEMADEX) 10 MG tablet   Acute blood loss anemia    Advised to reduce iron supplement to once daily.  Will check CGC andiron today.  If no improvement consider IV iron Lab Results  Component Value Date   WBC 5.8 02/09/2015   HGB 8.9* 02/09/2015   HCT 26.6* 02/09/2015   MCV 95.3 02/09/2015   PLT 181 02/09/2015         Relevant Orders   CBC with Differential/Platelet   Iron deficiency anemia   Relevant Orders   Ferritin   Iron and TIBC   Left-sided thoracic back pain    Agree with PT diagnosis of thoracic muscle spasm as she is nontender on exam and has no history suggestive of rib fracture. Trial of meloxicam, and prn tramadol refilled.       Relevant Medications   meloxicam (MOBIC) 15 MG tablet   traMADol (ULTRAM) 50 MG tablet   Long-term use of high-risk medication - Primary   Relevant Orders    Comprehensive metabolic panel    Other Visit Diagnoses    Vitamin D deficiency        Relevant Orders    Vit D  25 hydroxy (rtn osteoporosis monitoring)    Intertrochanteric fracture of left hip, open type I or II, with routine healing, subsequent encounter        Relevant Orders    DG Bone Density       I have discontinued Ms. Herringshaw's enoxaparin and oxyCODONE. I am also having her start on meloxicam. Additionally, I am having her maintain her Calcium Carbonate-Vitamin D, multivitamin with minerals, acetaminophen, triamcinolone cream, atorvastatin, carvedilol, levothyroxine, PRESERVISION AREDS 2, aspirin, ferrous sulfate, senna-docusate, pantoprazole, torsemide, cholecalciferol, feeding supplement (PRO-STAT SUGAR FREE 64), and traMADol.  Meds ordered this encounter  Medications  . torsemide (DEMADEX) 10 MG tablet    Sig: Take 10 mg by mouth daily.  . cholecalciferol (VITAMIN D) 400 UNITS TABS tablet    Sig: Take 400 Units by mouth.  . Amino Acids-Protein Hydrolys (FEEDING SUPPLEMENT, PRO-STAT SUGAR FREE 64,) LIQD    Sig: Take 30 mLs by mouth 3 (three) times daily with meals.  . meloxicam (MOBIC) 15 MG tablet    Sig: Take 1 tablet (15 mg total) by mouth daily.    Dispense:  30 tablet    Refill:  1  . traMADol (ULTRAM) 50 MG tablet    Sig: Take 1-2 tablets (50-100 mg total) by mouth every 6 (six) hours as needed for moderate pain.    Dispense:  60 tablet    Refill:  3   A total of 40 minutes was spent with patient more than half of which was spent in counseling patient on the above mentioned issues , reviewing and explaining recent labs and imaging studies done, and coordination of care.  Medications Discontinued During This Encounter  Medication Reason  . oxyCODONE (OXY IR/ROXICODONE) 5 MG immediate release tablet   . enoxaparin (LOVENOX) 40 MG/0.4ML injection   . traMADol (ULTRAM) 50 MG tablet Reorder    Follow-up: Return in about 3 months (around  06/02/2015).   [name], MD

## 2015-03-06 ENCOUNTER — Other Ambulatory Visit: Payer: Self-pay | Admitting: *Deleted

## 2015-03-06 NOTE — Patient Outreach (Signed)
RNCM made a follow up phone call to pt to set up a home visit. Home visit scheduled for Friday 10/7 @ 11 am. Pt stating she was doing well, getting stronger every day.   Plan: RNCM will see pt this Friday at [place] Risa Auman RN, BSN  [facility] Care Management [phone])

## 2015-03-07 DIAGNOSIS — K219 Gastro-esophageal reflux disease without esophagitis: Secondary | ICD-10-CM | POA: Diagnosis not present

## 2015-03-07 DIAGNOSIS — I2510 Atherosclerotic heart disease of native coronary artery without angina pectoris: Secondary | ICD-10-CM | POA: Diagnosis not present

## 2015-03-07 DIAGNOSIS — Z96642 Presence of left artificial hip joint: Secondary | ICD-10-CM | POA: Diagnosis not present

## 2015-03-07 DIAGNOSIS — I10 Essential (primary) hypertension: Secondary | ICD-10-CM | POA: Diagnosis not present

## 2015-03-07 DIAGNOSIS — S72002D Fracture of unspecified part of neck of left femur, subsequent encounter for closed fracture with routine healing: Secondary | ICD-10-CM | POA: Diagnosis not present

## 2015-03-07 DIAGNOSIS — I5032 Chronic diastolic (congestive) heart failure: Secondary | ICD-10-CM | POA: Diagnosis not present

## 2015-03-07 DIAGNOSIS — D510 Vitamin B12 deficiency anemia due to intrinsic factor deficiency: Secondary | ICD-10-CM | POA: Diagnosis not present

## 2015-03-07 DIAGNOSIS — Z951 Presence of aortocoronary bypass graft: Secondary | ICD-10-CM | POA: Diagnosis not present

## 2015-03-07 NOTE — Telephone Encounter (Signed)
Patient notified and voiced understanding.

## 2015-03-09 DIAGNOSIS — M25552 Pain in left hip: Secondary | ICD-10-CM | POA: Diagnosis not present

## 2015-03-10 ENCOUNTER — Other Ambulatory Visit: Payer: Self-pay | Admitting: *Deleted

## 2015-03-10 DIAGNOSIS — D510 Vitamin B12 deficiency anemia due to intrinsic factor deficiency: Secondary | ICD-10-CM | POA: Diagnosis not present

## 2015-03-10 DIAGNOSIS — I5032 Chronic diastolic (congestive) heart failure: Secondary | ICD-10-CM | POA: Diagnosis not present

## 2015-03-10 DIAGNOSIS — Z951 Presence of aortocoronary bypass graft: Secondary | ICD-10-CM | POA: Diagnosis not present

## 2015-03-10 DIAGNOSIS — Z96642 Presence of left artificial hip joint: Secondary | ICD-10-CM | POA: Diagnosis not present

## 2015-03-10 DIAGNOSIS — S72002D Fracture of unspecified part of neck of left femur, subsequent encounter for closed fracture with routine healing: Secondary | ICD-10-CM | POA: Diagnosis not present

## 2015-03-10 DIAGNOSIS — K219 Gastro-esophageal reflux disease without esophagitis: Secondary | ICD-10-CM | POA: Diagnosis not present

## 2015-03-10 DIAGNOSIS — I10 Essential (primary) hypertension: Secondary | ICD-10-CM | POA: Diagnosis not present

## 2015-03-10 DIAGNOSIS — I2510 Atherosclerotic heart disease of native coronary artery without angina pectoris: Secondary | ICD-10-CM | POA: Diagnosis not present

## 2015-03-10 NOTE — Patient Outreach (Signed)
[facility]) Care Management   03/10/2015  [place] May 30, 1926 [id number]  Jenna Marshall is an 81 y.o. female  Subjective: "I don't want to fall again." "I am continuing to exercise to get stronger." "I want to get back to cooking again."  Objective:Blood pressure 116/66, pulse 74, resp. rate 18, weight 169 lb (76.658 kg), SpO2 94 %.   Review of Systems  Constitutional: Positive for weight loss.    Physical Exam  Constitutional: She is oriented to person, place, and time. She appears well-developed and well-nourished.  Cardiovascular: Normal rate, regular rhythm and normal heart sounds.   Respiratory: Effort normal and breath sounds normal.  GI: Soft. Bowel sounds are normal.  Musculoskeletal: Normal range of motion.  Neurological: She is alert and oriented to person, place, and time.  Skin: Skin is warm and dry.     Psychiatric: She has a normal mood and affect. Her behavior is normal. Judgment and thought content normal. Cognition and memory are normal.    Current Medications:   Current Outpatient Prescriptions  Medication Sig Dispense Refill  . acetaminophen (TYLENOL) 325 MG tablet Take 2 tablets (650 mg total) by mouth every 6 (six) hours as needed for mild pain. (Patient taking differently: Take 325-650 mg by mouth every 6 (six) hours as needed for mild pain. )    . Amino Acids-Protein Hydrolys (FEEDING SUPPLEMENT, PRO-STAT SUGAR FREE 64,) LIQD Take 30 mLs by mouth 3 (three) times daily with meals.    Marland Kitchen aspirin 81 MG chewable tablet Chew 1 tablet (81 mg total) by mouth daily. 30 tablet 0  . atorvastatin (LIPITOR) 40 MG tablet Take 1 tablet (40 mg total) by mouth daily. 30 tablet 3  . Calcium Carbonate-Vitamin D (CALCIUM + D) 600-200 MG-UNIT TABS Take 1 tablet by mouth 2 (two) times daily.     . carvedilol (COREG) 3.125 MG tablet Take 3.125 mg by mouth 2 (two) times daily.    . Cholecalciferol (D3 DOTS) 2000 UNITS TBDP Take 2,000 Units by mouth.      . ferrous sulfate 325 (65 FE) MG tablet Take 1 tablet (325 mg total) by mouth 2 (two) times daily with a meal. (Patient taking differently: Take 325 mg by mouth daily with breakfast. ) 30 tablet 3  . levothyroxine (SYNTHROID, LEVOTHROID) 25 MCG tablet Take 25 mcg by mouth daily before breakfast.    . meloxicam (MOBIC) 15 MG tablet Take 1 tablet (15 mg total) by mouth daily. 30 tablet 1  . Multiple Vitamin (MULTIVITAMIN WITH MINERALS) TABS tablet Take 1 tablet by mouth daily.    . Multiple Vitamins-Minerals (PRESERVISION AREDS 2) CAPS Take 2 capsules by mouth daily.    . pantoprazole (PROTONIX) 40 MG tablet TAKE 1 TABLET BY MOUTH EACH DAY 30 tablet 5  . senna-docusate (SENOKOT-S) 8.6-50 MG per tablet Take 1 tablet by mouth 2 (two) times daily. 30 tablet 0  . torsemide (DEMADEX) 10 MG tablet Take 10 mg by mouth daily.    . traMADol (ULTRAM) 50 MG tablet Take 1-2 tablets (50-100 mg total) by mouth every 6 (six) hours as needed for moderate pain. 60 tablet 3  . triamcinolone cream (KENALOG) 0.1 % Apply 1 application topically 2 (two) times daily. (Patient taking differently: Apply 1 application topically 2 (two) times daily as needed (for rash). ) 45 g 0  . [DISCONTINUED] citalopram (CELEXA) 20 MG tablet Take 20 mg by mouth daily.       No current facility-administered medications for  this visit.    Functional Status:   In your present state of health, do you have any difficulty performing the following activities: 03/10/2015 01/13/2015  Hearing? Tempie Donning  Vision? N N  Difficulty concentrating or making decisions? N N  Walking or climbing stairs? Y Y  Dressing or bathing? Y Y  Doing errands, shopping? Y Y  Preparing Food and eating ? - -  Using the Toilet? - -  In the past six months, have you accidently leaked urine? - -  Do you have problems with loss of bowel control? - -  Managing your Medications? - -  Managing your Finances? - -  Housekeeping or managing your Housekeeping? - -     Fall/Depression Screening:    PHQ 2/9 Scores 03/10/2015 01/26/2015 12/01/2014 11/01/2014 09/29/2014  PHQ - 2 Score 0 0 0 0 0   Fall Risk  03/10/2015 01/26/2015 01/02/2015 12/01/2014 11/01/2014  Falls in the past year? Yes No No No No  Number falls in past yr: 1 - - - -  Injury with Fall? Yes - - - -  Risk for fall due to : Impaired mobility;Medication side effect History of fall(s);Impaired balance/gait;Impaired mobility - Medication side effect -  Follow up Education provided;Falls prevention discussed - - - -    Assessment:  See physical assessment as above.  General: A&Ox3. Pt daughter present, is pt's main care giver. PT there to work with pt. Pt up with walker, ambulating well. PT stated she will be seeing pt for a few more weeks. Daughter conts to fill pill pill box. Pt taking a protein supplement Pro-stat. Pt noted to be increasing iron in her diet to assist in increasing her hgb.   CAD: Open heart surgery in May. Pt with out chest pain. Weighing every day to monitor for fluid build up. Pt taking torsemide related to having an allergy to lasix. Pt has followed up with cardiology. No SOB noted when pt up to ambulate. Pt and daughter able to verbalized s/s of heart failure and reasons to call the MD.   [facility] watched pt get into her bathroom as daughter was concerned that the doorway needed to be widened. PT and RNCM observed pt and agreed pt was doing well navigating the doorway with her walker. PT encouraged that pt will be graduating to a cane soon.     Plan: RNCM will see pt next month with plans to discharge.  Erla Bacchi RN, BSN  [facility] Care Management [phone])  [facility] CM Care Plan Problem One        Most Recent Value   Care Plan Problem One  patient currently in Skilled nursing facility   Role Documenting the Problem One  Clinical Social Worker    [facility] CM Care Plan Problem Two        Most Recent Value   Care Plan Problem Two  Pt has goals to get back to doing her own house work  and yard work after CABG surgery in May.   Role Documenting the Problem Two  Care Management [place] for Problem Two  Active   Interventions for Problem Two Long Term Goal   Education on CAD and HF s/s done by [facility]. Discussion about gaining independance in her home.   [facility] Long Term Goal (31-90) days  Pt will not be admitted to the hospital with s/s of HF or CAD in the next 90 days.   [facility] Long Term Goal Start Date  03/13/15 [goal restarted now that pt is back home from hip surgery]   [facility] Long Term Goal Met Date  -- [pt ]   [facility] CM Short Term Goal #1 (0-30 days)  Pt will be able to name her medicines, times and purposes for each in the next 30 days.   [facility] CM Short Term Goal #1 Start Date  12/01/14   [facility] CM Short Term Goal #1 Met Date   01/02/15   Interventions for Short Term Goal #2   Discussed the importance of knowing medicine for independance.   [facility] CM Short Term Goal #2 (0-30 days)  Pt will be able to name the s/s of heart hf by the next RNCM visit in the next 30days.   [facility] CM Short Term Goal #2 Start Date  12/01/14   [facility] CM Short Term Goal #2 Met Date  01/02/15   Interventions for Short Term Goal #2  RNCM will print and provide education on HF and discuss s/s of CAD. Scale provided to pt for daily weights.   [facility] CM Short Term Goal #3 (0-30 days)  Pt will  maintain weight with low salt diet and exercise since fluid pill discontinued in the next 30days   [facility] CM Short Term Goal #3 Start Date  03/13/15   [facility] CM Short Term Goal #3 Met Date  -- [pt readmitted for hip fracture before goal met]   Interventions for Short Term Goal #3  Discussed low salt diet and the importance of weighing and recording to monitor for fluid.   [facility] CM Short Term Goal #4 (0-30 days)  Pt will continue home exercises after PT is completed for the next 30 days    [facility] CM Short Term Goal #4 Start Date  03/13/15 Barrie Folk restarted now that pt is back home]   Interventions of Short Term Goal #4  Education done with  pt on Cardiac rehab, silver sneakers, and the importance of continuing home exercise.

## 2015-03-13 ENCOUNTER — Other Ambulatory Visit: Payer: Self-pay | Admitting: *Deleted

## 2015-03-13 DIAGNOSIS — D510 Vitamin B12 deficiency anemia due to intrinsic factor deficiency: Secondary | ICD-10-CM | POA: Diagnosis not present

## 2015-03-13 DIAGNOSIS — K219 Gastro-esophageal reflux disease without esophagitis: Secondary | ICD-10-CM | POA: Diagnosis not present

## 2015-03-13 DIAGNOSIS — Z96642 Presence of left artificial hip joint: Secondary | ICD-10-CM | POA: Diagnosis not present

## 2015-03-13 DIAGNOSIS — I10 Essential (primary) hypertension: Secondary | ICD-10-CM | POA: Diagnosis not present

## 2015-03-13 DIAGNOSIS — S72002D Fracture of unspecified part of neck of left femur, subsequent encounter for closed fracture with routine healing: Secondary | ICD-10-CM | POA: Diagnosis not present

## 2015-03-13 DIAGNOSIS — I5032 Chronic diastolic (congestive) heart failure: Secondary | ICD-10-CM | POA: Diagnosis not present

## 2015-03-13 DIAGNOSIS — Z951 Presence of aortocoronary bypass graft: Secondary | ICD-10-CM | POA: Diagnosis not present

## 2015-03-13 DIAGNOSIS — I2510 Atherosclerotic heart disease of native coronary artery without angina pectoris: Secondary | ICD-10-CM | POA: Diagnosis not present

## 2015-03-13 NOTE — Patient Outreach (Signed)
[facility]) Care Management  03/13/2015  Chelsi Warr 1926-04-26 [id number]   Phone call to patient to assess for social work needs. Per patient, she is doing well continuing with home health through [facility].  Per patient, her daughter did hire a private duty aid for the first 2 1/2 weeks that she was home from the skilled nursing facility, however due to her progress this hasbeen discontinued.  Patient states that her children are taking turns spending night with her and are there throughout the day.  Per patient, she has all of her medications and have pulled up all all of her throw rugs and have cleared a path for her to avoid falls.  Patient continues to use her walker and is working with the physical therapist to eventually use her cain.  Patient verbalized no need for continued social work involvement.   Plan: case to be be closed to social work at this time.          RNCM to be informed.    [name] [facility] Care Management [phone]

## 2015-03-15 DIAGNOSIS — I2510 Atherosclerotic heart disease of native coronary artery without angina pectoris: Secondary | ICD-10-CM | POA: Diagnosis not present

## 2015-03-15 DIAGNOSIS — I10 Essential (primary) hypertension: Secondary | ICD-10-CM | POA: Diagnosis not present

## 2015-03-15 DIAGNOSIS — Z951 Presence of aortocoronary bypass graft: Secondary | ICD-10-CM | POA: Diagnosis not present

## 2015-03-15 DIAGNOSIS — Z96642 Presence of left artificial hip joint: Secondary | ICD-10-CM | POA: Diagnosis not present

## 2015-03-15 DIAGNOSIS — S72002D Fracture of unspecified part of neck of left femur, subsequent encounter for closed fracture with routine healing: Secondary | ICD-10-CM | POA: Diagnosis not present

## 2015-03-15 DIAGNOSIS — D510 Vitamin B12 deficiency anemia due to intrinsic factor deficiency: Secondary | ICD-10-CM | POA: Diagnosis not present

## 2015-03-15 DIAGNOSIS — I5032 Chronic diastolic (congestive) heart failure: Secondary | ICD-10-CM | POA: Diagnosis not present

## 2015-03-15 DIAGNOSIS — K219 Gastro-esophageal reflux disease without esophagitis: Secondary | ICD-10-CM | POA: Diagnosis not present

## 2015-03-20 DIAGNOSIS — I10 Essential (primary) hypertension: Secondary | ICD-10-CM | POA: Diagnosis not present

## 2015-03-20 DIAGNOSIS — I5032 Chronic diastolic (congestive) heart failure: Secondary | ICD-10-CM | POA: Diagnosis not present

## 2015-03-20 DIAGNOSIS — D510 Vitamin B12 deficiency anemia due to intrinsic factor deficiency: Secondary | ICD-10-CM | POA: Diagnosis not present

## 2015-03-20 DIAGNOSIS — Z951 Presence of aortocoronary bypass graft: Secondary | ICD-10-CM | POA: Diagnosis not present

## 2015-03-20 DIAGNOSIS — S72002D Fracture of unspecified part of neck of left femur, subsequent encounter for closed fracture with routine healing: Secondary | ICD-10-CM | POA: Diagnosis not present

## 2015-03-20 DIAGNOSIS — K219 Gastro-esophageal reflux disease without esophagitis: Secondary | ICD-10-CM | POA: Diagnosis not present

## 2015-03-20 DIAGNOSIS — I2510 Atherosclerotic heart disease of native coronary artery without angina pectoris: Secondary | ICD-10-CM | POA: Diagnosis not present

## 2015-03-20 DIAGNOSIS — Z96642 Presence of left artificial hip joint: Secondary | ICD-10-CM | POA: Diagnosis not present

## 2015-03-23 ENCOUNTER — Encounter: Payer: Self-pay | Admitting: *Deleted

## 2015-03-23 ENCOUNTER — Encounter: Payer: Self-pay | Admitting: Cardiovascular Disease

## 2015-03-23 ENCOUNTER — Ambulatory Visit: Payer: Self-pay | Admitting: Cardiovascular Disease

## 2015-03-23 ENCOUNTER — Ambulatory Visit (INDEPENDENT_AMBULATORY_CARE_PROVIDER_SITE_OTHER): Payer: Commercial Managed Care - HMO | Admitting: Cardiovascular Disease

## 2015-03-23 VITALS — BP 130/70 | HR 77 | Ht 62.0 in | Wt 174.5 lb

## 2015-03-23 DIAGNOSIS — E785 Hyperlipidemia, unspecified: Secondary | ICD-10-CM

## 2015-03-23 DIAGNOSIS — I10 Essential (primary) hypertension: Secondary | ICD-10-CM

## 2015-03-23 DIAGNOSIS — I2510 Atherosclerotic heart disease of native coronary artery without angina pectoris: Secondary | ICD-10-CM

## 2015-03-23 NOTE — Patient Instructions (Signed)
Medication Instructions: Continue same medications.   Labwork: None.   Procedures/Testing: None.   Follow-Up: 6 months with Dr. Audra Kagel.   Any Additional Special Instructions Will Be Listed Below (If Applicable).   

## 2015-03-23 NOTE — Progress Notes (Signed)
HPI  81 year old female with history of CAD s/p recent 4 vessel CABG on 4/40/1027, diastolic dysfunction, hypertension, and hyperlipidemia who is here today for a follow-up visit.  She was admitted to [facility] on 5/10 with a several week history of intermittent sub sternal chest pain and aching in the forearm muscles bilaterally that was occuring with exertion and at rest. She ruled out for MI. She had a high risk nuclear stress test (EF 41%) on 10/13/2014 and catheterization on 10/14/2014 showing 99% distal LM stenosis and significant LAD and RCA stenosis. An echo showed an LVEF of 25-36% and diastolic dysfunction.  She was transferred to [facility] on 5/13 for urgent CABG . She underwent a CABG x 4 on 10/15/2014. Successful LIMA-->LAD, SVG-->RCA, and sequential SVG to Ramus and OM.  She had a left hip fracture in August after a mechanical fall which required surgery. She is walking with a walker but has been gradually improving. She denies any chest pain or shortness of breath.   Allergies  Allergen Reactions  . Ambien [Zolpidem] Other (See Comments)    Reaction:  Hallucinations  . Codeine Nausea And Vomiting  . Lasix [Furosemide]     "Rash & itching"     Current Outpatient Prescriptions on File Prior to Visit  Medication Sig Dispense Refill  . acetaminophen (TYLENOL) 325 MG tablet Take 2 tablets (650 mg total) by mouth every 6 (six) hours as needed for mild pain. (Patient taking differently: Take 325-650 mg by mouth every 6 (six) hours as needed for mild pain. )    . aspirin 81 MG chewable tablet Chew 1 tablet (81 mg total) by mouth daily. 30 tablet 0  . atorvastatin (LIPITOR) 40 MG tablet Take 1 tablet (40 mg total) by mouth daily. 30 tablet 3  . Calcium Carbonate-Vitamin D (CALCIUM + D) 600-200 MG-UNIT TABS Take 1 tablet by mouth 2 (two) times daily.     . carvedilol (COREG) 3.125 MG tablet Take 3.125 mg by mouth 2 (two) times daily.    . Cholecalciferol (D3 DOTS) 2000 UNITS TBDP Take  2,000 Units by mouth.    . levothyroxine (SYNTHROID, LEVOTHROID) 25 MCG tablet Take 25 mcg by mouth daily before breakfast.    . meloxicam (MOBIC) 15 MG tablet Take 1 tablet (15 mg total) by mouth daily. 30 tablet 1  . Multiple Vitamin (MULTIVITAMIN WITH MINERALS) TABS tablet Take 1 tablet by mouth daily.    . Multiple Vitamins-Minerals (PRESERVISION AREDS 2) CAPS Take 2 capsules by mouth daily.    . pantoprazole (PROTONIX) 40 MG tablet TAKE 1 TABLET BY MOUTH EACH DAY 30 tablet 5  . senna-docusate (SENOKOT-S) 8.6-50 MG per tablet Take 1 tablet by mouth 2 (two) times daily. 30 tablet 0  . torsemide (DEMADEX) 10 MG tablet Take 10 mg by mouth daily.    . traMADol (ULTRAM) 50 MG tablet Take 1-2 tablets (50-100 mg total) by mouth every 6 (six) hours as needed for moderate pain. 60 tablet 3  . triamcinolone cream (KENALOG) 0.1 % Apply 1 application topically 2 (two) times daily. (Patient taking differently: Apply 1 application topically 2 (two) times daily as needed (for rash). ) 45 g 0  . [DISCONTINUED] citalopram (CELEXA) 20 MG tablet Take 20 mg by mouth daily.       No current facility-administered medications on file prior to visit.     Past Medical History  Diagnosis Date  . Hyperlipemia   . CAD (coronary artery disease)  a. Lexiscan 10/13/14: mid anterior to apical & inf wall ischemia w/ WMA, mild to mod dep EF; b. cardiac cath 10/14/2014: ost LM to LM 50%, LM 99%, ost LAD 95%, ost LAD 95%, prox LAD to mid LAD 40%, prox RCA 30%, mid RCA 70%, Critical left main stenosis. Significant distal RCA stenosis. Heavily calcified arteries. Normal EF by echo with no significant AS or MR. Recommend urgent CABG. cCABG x 4 (5/201  . GERD (gastroesophageal reflux disease)   . Macular degeneration   . Hyperlipidemia   . Hypertension   . Hypothyroidism   . Depression   . pernicious anemia   . Varicose veins   . CAD (coronary artery disease)     a. Lexi 10/13/14: mid ant to apical & inf wall ischemia w/  WMA, mild-mod dep EF; b. cath 10/14/14: ost LM to LM 50%, dLM 99%, ost LAD 95%, ost LAD 95%, pLAD-LAD 40%, pRCA 30%, mRCA 70%, Critical LM stenosis. Sig dRCA stenosis. Heavily calcified arteries; c. CABG x 4 (10/2014)  . Diastolic dysfunction     a. echo 10/2014: EF 55-60%, no RWMA, GR1DD, mild AI, trivial MR, mildly dilated LA, PASP normal     Past Surgical History  Procedure Laterality Date  . Cardiac catheterization Left 10/14/2014    Procedure: Left Heart Cath and Coronary Angiography;  Surgeon: Wellington Hampshire, MD;  Location: [place] [facility];  Service: Cardiovascular;  Laterality: Left;  . Coronary artery bypass graft N/A 10/14/2014    Procedure: CORONARY ARTERY BYPASS GRAFTING (CABG), ON PUMP, TIMES FOUR, USING LEFT INTERNAL MAMMARY ARTERY, RIGHT GREATER SAPHENOUS VEIN HARVESTED ENDOSCOPICALLY;  Surgeon: Gaye Pollack, MD;  Location: [place];  Service: Open Heart Surgery;  Laterality: N/A;  . Coronary artery bypass graft  10/2014    LIMA-->LAD, SVG-->RCA, sequential SVG-->Ramus and OM  . Varicose vein surgery    . Hemorrhoid surgery    . Hip arthroplasty Left 01/14/2015    Procedure: ARTHROPLASTY BIPOLAR HIP (HEMIARTHROPLASTY);  Surgeon: Dereck Leep, MD;  Location: [facility];  Service: Orthopedics;  Laterality: Left;     Family History  Problem Relation Age of Onset  . Coronary artery disease Mother   . Coronary artery disease Brother   . Diabetes Mother   . Heart disease Father      Social History   Social History  . Marital Status: Widowed    Spouse Name: N/A  . Number of Children: N/A  . Years of Education: N/A   Occupational History  . Not on file.   Social History Main Topics  . Smoking status: Never Smoker   . Smokeless tobacco: Never Used  . Alcohol Use: No  . Drug Use: No  . Sexual Activity: No   Other Topics Concern  . Not on file   Social History Narrative   ** Merged History Encounter **         PHYSICAL EXAM   BP 130/70 mmHg  Pulse 77   Ht 5\' 2"  (1.575 m)  Wt 174 lb 8 oz (79.153 kg)  BMI 31.91 kg/m2 Constitutional: She is oriented to person, place, and time. She appears well-developed and well-nourished. No distress.  HENT: No nasal discharge.  Head: Normocephalic and atraumatic.  Eyes: Pupils are equal and round. No discharge.  Neck: Normal range of motion. Neck supple. No JVD present. No thyromegaly present.  Cardiovascular: Normal rate, regular rhythm, normal heart sounds. Exam reveals no gallop and no friction rub. No murmur heard. Surgical incision site  is healing nicely.  Pulmonary/Chest: Effort normal and breath sounds normal. No stridor. No respiratory distress. She has no wheezes. She has no rales. She exhibits no tenderness.  Abdominal: Soft. Bowel sounds are normal. She exhibits no distension. There is no tenderness. There is no rebound and no guarding.  Musculoskeletal: Normal range of motion. She exhibits no edema and no tenderness.  Neurological: She is alert and oriented to person, place, and time. Coordination normal.  Skin: Skin is warm and dry. Generalized macular rash mostly on the upper body and arms. She is not diaphoretic. No erythema. No pallor.  Psychiatric: She has a normal mood and affect. Her behavior is normal. Judgment and thought content normal.       ASSESSMENT AND PLAN

## 2015-03-23 NOTE — Assessment & Plan Note (Signed)
She is doing very well at the present time with no symptoms suggestive of angina. I recommend continuing medical therapy.

## 2015-03-23 NOTE — Assessment & Plan Note (Signed)
Blood pressure is controlled on current medications. 

## 2015-03-23 NOTE — Assessment & Plan Note (Signed)
Lab Results  Component Value Date   CHOL 114 12/23/2014   HDL 40 12/23/2014   LDLCALC 56 12/23/2014        TRIG 88 12/23/2014   CHOLHDL 2.9 12/23/2014   LDL was at target. Continue current dose of atorvastatin. The dose was decreased to 40 mg after slightly abnormal liver tests.

## 2015-03-24 ENCOUNTER — Telehealth: Payer: Self-pay | Admitting: *Deleted

## 2015-03-24 ENCOUNTER — Other Ambulatory Visit: Payer: Self-pay | Admitting: Internal Medicine

## 2015-03-24 MED ORDER — FERROUS SULFATE 325 (65 FE) MG PO TABS: 325.0000 mg | ORAL_TABLET | Freq: Every day | ORAL | Status: DC

## 2015-03-24 NOTE — Telephone Encounter (Signed)
[facility].  Not [facility]

## 2015-03-24 NOTE — Telephone Encounter (Signed)
Pt left vm on Triage line stating that if you want for her to continue taking her iron medication then she will need a [facility]ill [facility]

## 2015-03-27 ENCOUNTER — Other Ambulatory Visit: Payer: Self-pay | Admitting: Internal Medicine

## 2015-03-27 ENCOUNTER — Other Ambulatory Visit: Payer: Self-pay | Admitting: *Deleted

## 2015-03-27 NOTE — Telephone Encounter (Signed)
Janci called from [facility] regarding pt torsemide (DEMADEX) 10 MG tablet. Pt needs a refill. Pharmacy is [facility] [phone]. Thank you!

## 2015-03-27 NOTE — Patient Outreach (Signed)
RNCM received a voicemail message over the weekend that pt was out of her torsemide and the family had been unable to obtain a refill. Daughter Juliann Pulse requested RNCM to check on this as she was planning to go out of town.   0900am RNCM called [facility] and spoke with Joseph Art' who stated he had been unable to obtain a refill also.  46am RNCM spoke with Rasheeda at Dr. Lupita Dawn office and explained pt needed a refill of torsemide related to she was out of this medication. Rasheeda let RNCM know she would give the message to Dr. Lupita Dawn Nurse.  59am RNCM called family member Muskan Bolla (son) and relayed the message that MD office had received the message to send refill to [facility] and [place] would check on the progress about 2pm. RNCM also educated son to watch for increased weight, swelling, and SOB since pt had been w/o med since Friday. Son stated pt had slight swelling in her ankles but no increase in weight or SOB.  Colon called [facility] to confirm refill had been filled. Renee at confirmed med ready to be picked up.  [place] called Ashley Jacobs and made him aware medication ready.   Plan: RNCM will see pt at next scheduled appointment.  Rutherford Limerick RN, BSN  [facility] Care Management [phone])

## 2015-03-27 NOTE — Telephone Encounter (Signed)
This came to me.

## 2015-04-03 DIAGNOSIS — I2510 Atherosclerotic heart disease of native coronary artery without angina pectoris: Secondary | ICD-10-CM | POA: Diagnosis not present

## 2015-04-03 DIAGNOSIS — S72002D Fracture of unspecified part of neck of left femur, subsequent encounter for closed fracture with routine healing: Secondary | ICD-10-CM | POA: Diagnosis not present

## 2015-04-03 DIAGNOSIS — K219 Gastro-esophageal reflux disease without esophagitis: Secondary | ICD-10-CM | POA: Diagnosis not present

## 2015-04-03 DIAGNOSIS — I5032 Chronic diastolic (congestive) heart failure: Secondary | ICD-10-CM | POA: Diagnosis not present

## 2015-04-03 DIAGNOSIS — D510 Vitamin B12 deficiency anemia due to intrinsic factor deficiency: Secondary | ICD-10-CM | POA: Diagnosis not present

## 2015-04-03 DIAGNOSIS — Z96642 Presence of left artificial hip joint: Secondary | ICD-10-CM | POA: Diagnosis not present

## 2015-04-03 DIAGNOSIS — Z951 Presence of aortocoronary bypass graft: Secondary | ICD-10-CM | POA: Diagnosis not present

## 2015-04-03 DIAGNOSIS — I10 Essential (primary) hypertension: Secondary | ICD-10-CM | POA: Diagnosis not present

## 2015-04-06 DIAGNOSIS — K219 Gastro-esophageal reflux disease without esophagitis: Secondary | ICD-10-CM | POA: Diagnosis not present

## 2015-04-06 DIAGNOSIS — I5032 Chronic diastolic (congestive) heart failure: Secondary | ICD-10-CM | POA: Diagnosis not present

## 2015-04-06 DIAGNOSIS — I10 Essential (primary) hypertension: Secondary | ICD-10-CM | POA: Diagnosis not present

## 2015-04-06 DIAGNOSIS — Z96642 Presence of left artificial hip joint: Secondary | ICD-10-CM | POA: Diagnosis not present

## 2015-04-06 DIAGNOSIS — Z951 Presence of aortocoronary bypass graft: Secondary | ICD-10-CM | POA: Diagnosis not present

## 2015-04-06 DIAGNOSIS — D510 Vitamin B12 deficiency anemia due to intrinsic factor deficiency: Secondary | ICD-10-CM | POA: Diagnosis not present

## 2015-04-06 DIAGNOSIS — S72002D Fracture of unspecified part of neck of left femur, subsequent encounter for closed fracture with routine healing: Secondary | ICD-10-CM | POA: Diagnosis not present

## 2015-04-06 DIAGNOSIS — I2510 Atherosclerotic heart disease of native coronary artery without angina pectoris: Secondary | ICD-10-CM | POA: Diagnosis not present

## 2015-04-10 ENCOUNTER — Other Ambulatory Visit: Payer: Self-pay | Admitting: *Deleted

## 2015-04-10 NOTE — Patient Outreach (Signed)
[name]) Care Management   04/10/2015  Jenna Marshall Aug 17, 1925 [id number]  Jenna Marshall is an 81 y.o. female  Subjective: "I am doing so well, I attended my granddaughter's wedding this weekend." "I am getting around well with my walker and my PT is going to graduate me to a cane." "My family is still looking in on me every day and I am weighing every day."  Objective: Blood pressure 118/68, pulse 78, resp. rate 18, weight 168 lb (76.204 kg), SpO2 95 %.   Review of Systems  Respiratory: Negative for shortness of breath.   Cardiovascular: Negative for chest pain and leg swelling.  Musculoskeletal: Negative for falls.    Physical Exam  Constitutional: She is oriented to person, place, and time. She appears well-developed and well-nourished.  Cardiovascular: Normal rate, regular rhythm and normal heart sounds.   Respiratory: Effort normal and breath sounds normal.  GI: Soft. Bowel sounds are normal.  Musculoskeletal: Normal range of motion.  Neurological: She is alert and oriented to person, place, and time.  Skin: Skin is warm, dry and intact.  Psychiatric: She has a normal mood and affect. Her behavior is normal. Judgment and thought content normal. Cognition and memory are normal.    Current Medications:   Current Outpatient Prescriptions  Medication Sig Dispense Refill  . acetaminophen (TYLENOL) 325 MG tablet Take 2 tablets (650 mg total) by mouth every 6 (six) hours as needed for mild pain. (Patient taking differently: Take 325-650 mg by mouth every 6 (six) hours as needed for mild pain. )    . aspirin 81 MG chewable tablet Chew 1 tablet (81 mg total) by mouth daily. 30 tablet 0  . atorvastatin (LIPITOR) 40 MG tablet Take 1 tablet (40 mg total) by mouth daily. 30 tablet 3  . Calcium Carbonate-Vitamin D (CALCIUM + D) 600-200 MG-UNIT TABS Take 1 tablet by mouth 2 (two) times daily.     . carvedilol (COREG) 3.125 MG tablet Take 3.125 mg by mouth 2 (two)  times daily.    . Cholecalciferol (D3 DOTS) 2000 UNITS TBDP Take 2,000 Units by mouth.    . ferrous sulfate (KP FERROUS SULFATE) 325 (65 FE) MG tablet Take 1 tablet (325 mg total) by mouth daily with breakfast. 90 tablet 0  . levothyroxine (SYNTHROID, LEVOTHROID) 25 MCG tablet Take 25 mcg by mouth daily before breakfast.    . meloxicam (MOBIC) 15 MG tablet Take 1 tablet (15 mg total) by mouth daily. 30 tablet 1  . Multiple Vitamin (MULTIVITAMIN WITH MINERALS) TABS tablet Take 1 tablet by mouth daily.    . Multiple Vitamins-Minerals (PRESERVISION AREDS 2) CAPS Take 2 capsules by mouth daily.    . pantoprazole (PROTONIX) 40 MG tablet TAKE 1 TABLET BY MOUTH EACH DAY 30 tablet 5  . senna-docusate (SENOKOT-S) 8.6-50 MG per tablet Take 1 tablet by mouth 2 (two) times daily. 30 tablet 0  . torsemide (DEMADEX) 10 MG tablet TAKE ONE TABLET BY MOUTH EVERY DAY 30 tablet 1  . traMADol (ULTRAM) 50 MG tablet Take 1-2 tablets (50-100 mg total) by mouth every 6 (six) hours as needed for moderate pain. 60 tablet 3  . triamcinolone cream (KENALOG) 0.1 % Apply 1 application topically 2 (two) times daily. (Patient taking differently: Apply 1 application topically 2 (two) times daily as needed (for rash). ) 45 g 0  . [DISCONTINUED] citalopram (CELEXA) 20 MG tablet Take 20 mg by mouth daily.       No current  facility-administered medications for this visit.    Functional Status:   In your present state of health, do you have any difficulty performing the following activities: 03/10/2015 01/13/2015  Hearing? Tempie Donning  Vision? N N  Difficulty concentrating or making decisions? N N  Walking or climbing stairs? Y Y  Dressing or bathing? Y Y  Doing errands, shopping? Y Y  Preparing Food and eating ? - -  Using the Toilet? - -  In the past six months, have you accidently leaked urine? - -  Do you have problems with loss of bowel control? - -  Managing your Medications? - -  Managing your Finances? - -  Housekeeping or  managing your Housekeeping? - -    Fall/Depression Screening:    PHQ 2/9 Scores 03/10/2015 01/26/2015 12/01/2014 11/01/2014 09/29/2014  PHQ - 2 Score 0 0 0 0 0   Fall Risk  03/10/2015 01/26/2015 01/02/2015 12/01/2014 11/01/2014  Falls in the past year? Yes No No No No  Number falls in past yr: 1 - - - -  Injury with Fall? Yes - - - -  Risk for fall due to : Impaired mobility;Medication side effect History of fall(s);Impaired balance/gait;Impaired mobility - Medication side effect -  Follow up Education provided;Falls prevention discussed - - - -    Assessment:  Pt alert and oriented x3 Pt has good support system in her family. Physical assessment unremarkable. Pt with care management goals met. Pt in agreement. Pt ambulating well with walker and plans to progress to a cane. Pt reports doing exercise everyday even when therapist not scheduled. Pt able to voice elements of a heart healthy diet. RNCM reinforced fall precautions with pt.    Plan: RNCM will close pt's case to care management related to goals met.  Asani Deniston RN, BSN  [facility] Care Management [phone])  [facility] CM Care Plan Problem One        Most Recent Value   Care Plan Problem One  patient currently in Skilled nursing facility   Role Documenting the Problem One  Clinical Social Worker    [facility] CM Care Plan Problem Two        Most Recent Value   Care Plan Problem Two  Pt has goals to get back to doing her own house work and yard work after CABG surgery in May.   Role Documenting the Problem Two  Care Management [place] for Problem Two  Active   Interventions for Problem Two Long Term Goal   Education on CAD and HF s/s done by [facility]. Discussion about gaining independance in her home.   [facility] Long Term Goal (31-90) days  Pt will not be admitted to the hospital with s/s of HF or CAD in the next 90 days.   [facility] Long Term Goal Start Date  03/13/15 Barrie Folk restarted now that pt is back home from hip surgery]   [facility] Long Term Goal  Met Date  04/10/15 [pt ]   [facility] CM Short Term Goal #1 (0-30 days)  Pt will be able to name her medicines, times and purposes for each in the next 30 days.   [facility] CM Short Term Goal #1 Start Date  12/01/14   [facility] CM Short Term Goal #1 Met Date   01/02/15   Interventions for Short Term Goal #2   Discussed the importance of knowing medicine for independance.   [facility] CM Short Term Goal #2 (0-30 days)  Pt will be able  to name the s/s of heart hf by the next [facility] visit in the next 30days.   [facility] CM Short Term Goal #2 Start Date  12/01/14   [facility] CM Short Term Goal #2 Met Date  01/02/15   Interventions for Short Term Goal #2  RNCM will print and provide education on HF and discuss s/s of CAD. Scale provided to pt for daily weights.   [facility] CM Short Term Goal #3 (0-30 days)  Pt will  maintain weight with low salt diet and exercise since fluid pill discontinued in the next 30days   [facility] CM Short Term Goal #3 Start Date  03/13/15   [facility] CM Short Term Goal #3 Met Date  04/10/15 [pt readmitted for hip fracture before goal met]   Interventions for Short Term Goal #3  Discussed low salt diet and the importance of weighing and recording to monitor for fluid.   [facility] CM Short Term Goal #4 (0-30 days)  Pt will continue home exercises after PT is completed for the next 30 days    [facility] CM Short Term Goal #4 Start Date  03/13/15 Barrie Folk restarted now that pt is back home]   [facility] CM Short Term Goal #4 Met Date  04/10/15   Interventions of Short Term Goal #4  Education done with pt on Cardiac rehab, silver sneakers, and the importance of continuing home exercise.

## 2015-04-11 ENCOUNTER — Other Ambulatory Visit: Payer: Self-pay | Admitting: *Deleted

## 2015-04-11 DIAGNOSIS — D510 Vitamin B12 deficiency anemia due to intrinsic factor deficiency: Secondary | ICD-10-CM | POA: Diagnosis not present

## 2015-04-11 DIAGNOSIS — I2510 Atherosclerotic heart disease of native coronary artery without angina pectoris: Secondary | ICD-10-CM | POA: Diagnosis not present

## 2015-04-11 DIAGNOSIS — I10 Essential (primary) hypertension: Secondary | ICD-10-CM | POA: Diagnosis not present

## 2015-04-11 DIAGNOSIS — Z951 Presence of aortocoronary bypass graft: Secondary | ICD-10-CM | POA: Diagnosis not present

## 2015-04-11 DIAGNOSIS — S72002D Fracture of unspecified part of neck of left femur, subsequent encounter for closed fracture with routine healing: Secondary | ICD-10-CM | POA: Diagnosis not present

## 2015-04-11 DIAGNOSIS — I5032 Chronic diastolic (congestive) heart failure: Secondary | ICD-10-CM | POA: Diagnosis not present

## 2015-04-11 DIAGNOSIS — Z96642 Presence of left artificial hip joint: Secondary | ICD-10-CM | POA: Diagnosis not present

## 2015-04-11 DIAGNOSIS — K219 Gastro-esophageal reflux disease without esophagitis: Secondary | ICD-10-CM | POA: Diagnosis not present

## 2015-04-12 NOTE — Patient Outreach (Signed)
[name]) Care Management  04/12/2015  Jenna Marshall [birth year] [id number]   Notification from [name], RN to close case due to goals met with [facility] Care Management.  Thanks, [name]. [name], [name] Assistant Phone: [phone] Fax: [phone]

## 2015-04-13 DIAGNOSIS — Z951 Presence of aortocoronary bypass graft: Secondary | ICD-10-CM | POA: Diagnosis not present

## 2015-04-13 DIAGNOSIS — I10 Essential (primary) hypertension: Secondary | ICD-10-CM | POA: Diagnosis not present

## 2015-04-13 DIAGNOSIS — I5032 Chronic diastolic (congestive) heart failure: Secondary | ICD-10-CM | POA: Diagnosis not present

## 2015-04-13 DIAGNOSIS — K219 Gastro-esophageal reflux disease without esophagitis: Secondary | ICD-10-CM | POA: Diagnosis not present

## 2015-04-13 DIAGNOSIS — S72002D Fracture of unspecified part of neck of left femur, subsequent encounter for closed fracture with routine healing: Secondary | ICD-10-CM | POA: Diagnosis not present

## 2015-04-13 DIAGNOSIS — I2510 Atherosclerotic heart disease of native coronary artery without angina pectoris: Secondary | ICD-10-CM | POA: Diagnosis not present

## 2015-04-13 DIAGNOSIS — Z96642 Presence of left artificial hip joint: Secondary | ICD-10-CM | POA: Diagnosis not present

## 2015-04-13 DIAGNOSIS — D510 Vitamin B12 deficiency anemia due to intrinsic factor deficiency: Secondary | ICD-10-CM | POA: Diagnosis not present

## 2015-04-17 ENCOUNTER — Telehealth: Payer: Self-pay | Admitting: Internal Medicine

## 2015-04-17 DIAGNOSIS — Z96642 Presence of left artificial hip joint: Secondary | ICD-10-CM | POA: Diagnosis not present

## 2015-04-17 DIAGNOSIS — I2510 Atherosclerotic heart disease of native coronary artery without angina pectoris: Secondary | ICD-10-CM | POA: Diagnosis not present

## 2015-04-17 DIAGNOSIS — D510 Vitamin B12 deficiency anemia due to intrinsic factor deficiency: Secondary | ICD-10-CM | POA: Diagnosis not present

## 2015-04-17 DIAGNOSIS — Z951 Presence of aortocoronary bypass graft: Secondary | ICD-10-CM | POA: Diagnosis not present

## 2015-04-17 DIAGNOSIS — S72002D Fracture of unspecified part of neck of left femur, subsequent encounter for closed fracture with routine healing: Secondary | ICD-10-CM | POA: Diagnosis not present

## 2015-04-17 DIAGNOSIS — I5032 Chronic diastolic (congestive) heart failure: Secondary | ICD-10-CM | POA: Diagnosis not present

## 2015-04-17 DIAGNOSIS — K219 Gastro-esophageal reflux disease without esophagitis: Secondary | ICD-10-CM | POA: Diagnosis not present

## 2015-04-17 DIAGNOSIS — I10 Essential (primary) hypertension: Secondary | ICD-10-CM | POA: Diagnosis not present

## 2015-04-17 NOTE — Telephone Encounter (Signed)
Why was appt on NOv 16th moved?  You can put her back on at 4:30 if daughter can bring her

## 2015-04-17 NOTE — Telephone Encounter (Signed)
Patients daughter called stating she just found out that her mother appointment was cancelled for 04/19/15. The appointment was moved to 06/02/15, she was just worried about her mothers hemoglobin being low. Please advise?

## 2015-04-17 NOTE — Telephone Encounter (Signed)
Jill [phone] called from [facility] regarding needing verbal order to continue PT. Thank You!

## 2015-04-17 NOTE — Telephone Encounter (Signed)
Yes, Thank you

## 2015-04-17 NOTE — Telephone Encounter (Signed)
Called the daughter to let her know it was cancelled due to her being seen somewhere else earlier this month. They she stated no one cancelled that appointment and know one called to state that it was cancelled and the patient was worried and not happy about now knowing it was cancelled till today.

## 2015-04-17 NOTE — Telephone Encounter (Signed)
Daughter wanted to move it to sooner and ashley read your note and moved her three months from last appointment

## 2015-04-17 NOTE — Telephone Encounter (Signed)
Ok to give verbal order.

## 2015-04-18 ENCOUNTER — Other Ambulatory Visit: Payer: Self-pay | Admitting: Internal Medicine

## 2015-04-18 NOTE — Telephone Encounter (Signed)
At [place] office. Will route to Dr. Tullo's CMA for further follow up.  

## 2015-04-19 ENCOUNTER — Ambulatory Visit: Payer: Self-pay | Admitting: Internal Medicine

## 2015-04-19 ENCOUNTER — Ambulatory Visit (INDEPENDENT_AMBULATORY_CARE_PROVIDER_SITE_OTHER): Payer: Commercial Managed Care - HMO | Admitting: Internal Medicine

## 2015-04-19 ENCOUNTER — Encounter: Payer: Self-pay | Admitting: Internal Medicine

## 2015-04-19 VITALS — BP 142/68 | HR 79 | Temp 98.3°F | Wt 174.2 lb

## 2015-04-19 DIAGNOSIS — D62 Acute posthemorrhagic anemia: Secondary | ICD-10-CM

## 2015-04-19 DIAGNOSIS — S72142S Displaced intertrochanteric fracture of left femur, sequela: Secondary | ICD-10-CM

## 2015-04-19 DIAGNOSIS — Z1239 Encounter for other screening for malignant neoplasm of breast: Secondary | ICD-10-CM | POA: Diagnosis not present

## 2015-04-19 DIAGNOSIS — E034 Atrophy of thyroid (acquired): Secondary | ICD-10-CM

## 2015-04-19 DIAGNOSIS — Z23 Encounter for immunization: Secondary | ICD-10-CM

## 2015-04-19 DIAGNOSIS — E038 Other specified hypothyroidism: Secondary | ICD-10-CM

## 2015-04-19 DIAGNOSIS — E785 Hyperlipidemia, unspecified: Secondary | ICD-10-CM

## 2015-04-19 DIAGNOSIS — I10 Essential (primary) hypertension: Secondary | ICD-10-CM

## 2015-04-19 DIAGNOSIS — D509 Iron deficiency anemia, unspecified: Secondary | ICD-10-CM

## 2015-04-19 NOTE — Patient Instructions (Addendum)
You are doing very well!!  You can stop the iron tablet   Your mammogram and Bone Density tests will be done in January   I will see you in  6 months,  sooner if you need me

## 2015-04-19 NOTE — Telephone Encounter (Signed)
Attempted to contact Sharee Pimple to give verbal order, but was unable to reach. I could not leave a voicemail because inbox was full. Will try again later.

## 2015-04-19 NOTE — Telephone Encounter (Signed)
I spoke with Sharee Pimple. I gave her verbal order to continue PT, per Dr. Derrel Nip

## 2015-04-20 DIAGNOSIS — I5032 Chronic diastolic (congestive) heart failure: Secondary | ICD-10-CM | POA: Diagnosis not present

## 2015-04-20 DIAGNOSIS — I10 Essential (primary) hypertension: Secondary | ICD-10-CM | POA: Diagnosis not present

## 2015-04-20 DIAGNOSIS — I2510 Atherosclerotic heart disease of native coronary artery without angina pectoris: Secondary | ICD-10-CM | POA: Diagnosis not present

## 2015-04-20 DIAGNOSIS — Z96642 Presence of left artificial hip joint: Secondary | ICD-10-CM | POA: Diagnosis not present

## 2015-04-20 DIAGNOSIS — Z951 Presence of aortocoronary bypass graft: Secondary | ICD-10-CM | POA: Diagnosis not present

## 2015-04-20 DIAGNOSIS — K219 Gastro-esophageal reflux disease without esophagitis: Secondary | ICD-10-CM | POA: Diagnosis not present

## 2015-04-20 DIAGNOSIS — S72002D Fracture of unspecified part of neck of left femur, subsequent encounter for closed fracture with routine healing: Secondary | ICD-10-CM | POA: Diagnosis not present

## 2015-04-20 DIAGNOSIS — D510 Vitamin B12 deficiency anemia due to intrinsic factor deficiency: Secondary | ICD-10-CM | POA: Diagnosis not present

## 2015-04-20 LAB — CBC WITH DIFFERENTIAL/PLATELET
BASOS PCT: 0.4 % (ref 0.0–3.0)
Basophils Absolute: 0 10*3/uL (ref 0.0–0.1)
Eosinophils Absolute: 0.2 10*3/uL (ref 0.0–0.7)
Eosinophils Relative: 3.7 % (ref 0.0–5.0)
HEMATOCRIT: 34.5 % — AB (ref 36.0–46.0)
Hemoglobin: 11.5 g/dL — ABNORMAL LOW (ref 12.0–15.0)
LYMPHS PCT: 25.8 % (ref 12.0–46.0)
Lymphs Abs: 1.7 10*3/uL (ref 0.7–4.0)
MCHC: 33.4 g/dL (ref 30.0–36.0)
MCV: 93.4 fl (ref 78.0–100.0)
MONOS PCT: 9.4 % (ref 3.0–12.0)
Monocytes Absolute: 0.6 10*3/uL (ref 0.1–1.0)
NEUTROS ABS: 4 10*3/uL (ref 1.4–7.7)
Neutrophils Relative %: 60.7 % (ref 43.0–77.0)
PLATELETS: 165 10*3/uL (ref 150.0–400.0)
RBC: 3.69 Mil/uL — ABNORMAL LOW (ref 3.87–5.11)
RDW: 16.6 % — AB (ref 11.5–15.5)
WBC: 6.5 10*3/uL (ref 4.0–10.5)

## 2015-04-20 LAB — IBC PANEL
Iron: 47 ug/dL (ref 42–145)
Saturation Ratios: 17.6 % — ABNORMAL LOW (ref 20.0–50.0)
Transferrin: 191 mg/dL — ABNORMAL LOW (ref 212.0–360.0)

## 2015-04-21 ENCOUNTER — Other Ambulatory Visit: Payer: Self-pay | Admitting: Internal Medicine

## 2015-04-21 NOTE — Assessment & Plan Note (Signed)
Early resolved on once daily iron.  She is requesting suspension of iron due to altered taste.  Will suspend ad recheck levels in 3 months  Lab Results  Component Value Date   WBC 6.5 04/20/2015   HGB 11.5* 04/20/2015   HCT 34.5* 04/20/2015   MCV 93.4 04/20/2015   PLT 165.0 04/20/2015   Lab Results  Component Value Date   IRON 47 04/20/2015   TIBC 213* 03/03/2015   FERRITIN 65.6 03/03/2015

## 2015-04-21 NOTE — Progress Notes (Signed)
Subjective:  Patient ID: Jenna Marshall, female    DOB: [birth year]  Age: 81 y.o. MRN: [id number]  CC: The primary encounter diagnosis was Acute post-hemorrhagic anemia. Diagnoses of Intertrochanteric fracture of left hip, sequela, Breast cancer screening, Need for prophylactic vaccination against Streptococcus pneumoniae (pneumococcus), Essential hypertension, Hyperlipidemia, Hypothyroidism due to acquired atrophy of thyroid, and Iron deficiency anemia were also pertinent to this visit.  HPI [name] presents for follow up on anemia and sequela of left hip fracture which occurred in August 2016.  She is accompanied by her daughter Jenna Marshall today.Patient has been taking a daily iron supplement and appetite has improved in spite of the iron altering her sense of taste and smell.  Everything smells musty to her since she started the iron supplement.  She is ambulating with a cane after having improved strength and stability with PT.  No falls   Family providing assistance except at night, patient wears a Life Alert when at home.  No new issues wants to stop the iron.  Has follow up with her orthopedist Dr. Marry Guan next week.   Outpatient Prescriptions Prior to Visit  Medication Sig Dispense Refill  . acetaminophen (TYLENOL) 325 MG tablet Take 2 tablets (650 mg total) by mouth every 6 (six) hours as needed for mild pain. (Patient taking differently: Take 325-650 mg by mouth every 6 (six) hours as needed for mild pain. )    . aspirin 81 MG chewable tablet Chew 1 tablet (81 mg total) by mouth daily. 30 tablet 0  . atorvastatin (LIPITOR) 40 MG tablet Take 1 tablet (40 mg total) by mouth daily. 30 tablet 3  . Calcium Carbonate-Vitamin D (CALCIUM + D) 600-200 MG-UNIT TABS Take 1 tablet by mouth 2 (two) times daily.     . carvedilol (COREG) 3.125 MG tablet Take 3.125 mg by mouth 2 (two) times daily.    . Cholecalciferol (D3 DOTS) 2000 UNITS TBDP Take 2,000 Units by mouth.    . ferrous sulfate (KP FERROUS  SULFATE) 325 (65 FE) MG tablet Take 1 tablet (325 mg total) by mouth daily with breakfast. 90 tablet 0  . levothyroxine (SYNTHROID, LEVOTHROID) 25 MCG tablet Take 25 mcg by mouth daily before breakfast.    . Multiple Vitamin (MULTIVITAMIN WITH MINERALS) TABS tablet Take 1 tablet by mouth daily.    . Multiple Vitamins-Minerals (PRESERVISION AREDS 2) CAPS Take 1 capsule by mouth 2 (two) times daily.     . pantoprazole (PROTONIX) 40 MG tablet TAKE 1 TABLET BY MOUTH EACH DAY 30 tablet 5  . senna-docusate (SENOKOT-S) 8.6-50 MG per tablet Take 1 tablet by mouth 2 (two) times daily. 30 tablet 0  . torsemide (DEMADEX) 10 MG tablet TAKE ONE TABLET BY MOUTH EVERY DAY 30 tablet 3  . traMADol (ULTRAM) 50 MG tablet Take 1-2 tablets (50-100 mg total) by mouth every 6 (six) hours as needed for moderate pain. 60 tablet 3  . triamcinolone cream (KENALOG) 0.1 % Apply 1 application topically 2 (two) times daily. (Patient taking differently: Apply 1 application topically 2 (two) times daily as needed (for rash). ) 45 g 0  . meloxicam (MOBIC) 15 MG tablet Take 1 tablet (15 mg total) by mouth daily. (Patient not taking: Reported on 04/19/2015) 30 tablet 1   No facility-administered medications prior to visit.    Review of Systems;  Patient denies headache, fevers, malaise, unintentional weight loss, skin rash, eye pain, sinus congestion and sinus pain, sore throat, dysphagia,  hemoptysis , cough,  dyspnea, wheezing, chest pain, palpitations, orthopnea, edema, abdominal pain, nausea, melena, diarrhea, constipation, flank pain, dysuria, hematuria, urinary  Frequency, nocturia, numbness, tingling, seizures,  Focal weakness, Loss of consciousness,  Tremor, insomnia, depression, anxiety, and suicidal ideation.      Objective:  BP 142/68 mmHg  Pulse 79  Temp(Src) 98.3 F (36.8 C) (Oral)  Wt 174 lb 3.2 oz (79.017 kg)  SpO2 96%  BP Readings from Last 3 Encounters:  04/19/15 142/68  04/10/15 118/68  03/23/15 130/70      Wt Readings from Last 3 Encounters:  04/19/15 174 lb 3.2 oz (79.017 kg)  04/10/15 168 lb (76.204 kg)  03/23/15 174 lb 8 oz (79.153 kg)    General appearance: alert, cooperative and appears stated age Ears: normal TM's and external ear canals both ears Throat: lips, mucosa, and tongue normal; teeth and gums normal Neck: no adenopathy, no carotid bruit, supple, symmetrical, trachea midline and thyroid not enlarged, symmetric, no tenderness/mass/nodules Back: symmetric, no curvature. ROM normal. No CVA tenderness. Lungs: clear to auscultation bilaterally Heart: regular rate and rhythm, S1, S2 normal, no murmur, click, rub or gallop Abdomen: soft, non-tender; bowel sounds normal; no masses,  no organomegaly Pulses: 2+ and symmetric Skin: Skin color, texture, turgor normal. No rashes or lesions Lymph nodes: Cervical, supraclavicular, and axillary nodes normal. MSK: 5/5 strength in all 4 extremities .  Able to ruse from chair without assistance   No results found for: HGBA1C  Lab Results  Component Value Date   CREATININE 0.94 03/03/2015   CREATININE 0.79 02/09/2015   CREATININE 1.03* 01/23/2015    Lab Results  Component Value Date   WBC 6.5 04/20/2015   HGB 11.5* 04/20/2015   HCT 34.5* 04/20/2015   PLT 165.0 04/20/2015   GLUCOSE 102* 03/03/2015   CHOL 114 12/23/2014   TRIG 88 12/23/2014   HDL 40 12/23/2014   LDLDIRECT 104.4 02/27/2011   LDLCALC 56 12/23/2014   ALT 14 03/03/2015   AST 19 03/03/2015   NA 142 03/03/2015   K 4.2 03/03/2015   CL 103 03/03/2015   CREATININE 0.94 03/03/2015   BUN 30* 03/03/2015   CO2 28 03/03/2015   TSH 2.626 01/23/2015   INR 1.64* 10/14/2014    No results found.  Assessment & Plan:   Problem List Items Addressed This Visit    Essential hypertension (Chronic)    Well controlled on current regimen. , no changes today.      Hyperlipidemia (Chronic)    Tolerating atorvastatin 40 mg daily . LDL is at [facility] and l LFTs have been  normal.  Lab Results  Component Value Date   CHOL 114 12/23/2014   HDL 40 12/23/2014   LDLCALC 56 12/23/2014   LDLDIRECT 104.4 02/27/2011   TRIG 88 12/23/2014   CHOLHDL 2.9 12/23/2014   Lab Results  Component Value Date   ALT 14 03/03/2015   AST 19 03/03/2015   ALKPHOS 68 03/03/2015   BILITOT 0.4 03/03/2015            Hypothyroidism    Thyroid function is WNL on current dose.  No current changes needed.   Lab Results  Component Value Date   TSH 2.626 01/23/2015           Iron deficiency anemia    Early resolved on once daily iron.  She is requesting suspension of iron due to altered taste.  Will suspend ad recheck levels in 3 months  Lab Results  Component Value Date   WBC  6.5 04/20/2015   HGB 11.5* 04/20/2015   HCT 34.5* 04/20/2015   MCV 93.4 04/20/2015   PLT 165.0 04/20/2015   Lab Results  Component Value Date   IRON 47 04/20/2015   TIBC 213* 03/03/2015   FERRITIN 65.6 03/03/2015            Other Visit Diagnoses    Acute post-hemorrhagic anemia    -  Primary    Relevant Orders    CBC with Differential/Platelet (Completed)    IBC panel (Completed)    Intertrochanteric fracture of left hip, sequela        Relevant Orders    DG Bone Density    Breast cancer screening        Relevant Orders    MM DIGITAL SCREENING BILATERAL    Need for prophylactic vaccination against Streptococcus pneumoniae (pneumococcus)        Relevant Orders    Pneumococcal polysaccharide vaccine 23-valent greater than or equal to 2yo subcutaneous/IM (Completed)      A total of 25 minutes of face to face time was spent with patient more than half of which was spent in counselling about the above mentioned conditions  and coordination of care  I am having Ms. Friedel maintain her Calcium Carbonate-Vitamin D, multivitamin with minerals, acetaminophen, triamcinolone cream, atorvastatin, carvedilol, levothyroxine, PRESERVISION AREDS 2, aspirin, senna-docusate, pantoprazole,  meloxicam, traMADol, Cholecalciferol, ferrous sulfate, and torsemide.  No orders of the defined types were placed in this encounter.    There are no discontinued medications.  Follow-up: No Follow-up on file.   [name], MD

## 2015-04-21 NOTE — Assessment & Plan Note (Signed)
Well controlled on current regimen, no changes today. 

## 2015-04-21 NOTE — Assessment & Plan Note (Signed)
Tolerating atorvastatin 40 mg daily . LDL is at [facility] and l LFTs have been normal.  Lab Results  Component Value Date   CHOL 114 12/23/2014   HDL 40 12/23/2014   LDLCALC 56 12/23/2014   LDLDIRECT 104.4 02/27/2011   TRIG 88 12/23/2014   CHOLHDL 2.9 12/23/2014   Lab Results  Component Value Date   ALT 14 03/03/2015   AST 19 03/03/2015   ALKPHOS 68 03/03/2015   BILITOT 0.4 03/03/2015

## 2015-04-21 NOTE — Assessment & Plan Note (Signed)
Thyroid function is WNL on current dose.  No current changes needed.   Lab Results  Component Value Date   TSH 2.626 01/23/2015

## 2015-04-25 DIAGNOSIS — D510 Vitamin B12 deficiency anemia due to intrinsic factor deficiency: Secondary | ICD-10-CM | POA: Diagnosis not present

## 2015-04-25 DIAGNOSIS — I5032 Chronic diastolic (congestive) heart failure: Secondary | ICD-10-CM | POA: Diagnosis not present

## 2015-04-25 DIAGNOSIS — I10 Essential (primary) hypertension: Secondary | ICD-10-CM | POA: Diagnosis not present

## 2015-04-25 DIAGNOSIS — Z96649 Presence of unspecified artificial hip joint: Secondary | ICD-10-CM | POA: Diagnosis not present

## 2015-04-25 DIAGNOSIS — I2510 Atherosclerotic heart disease of native coronary artery without angina pectoris: Secondary | ICD-10-CM | POA: Diagnosis not present

## 2015-04-25 DIAGNOSIS — K219 Gastro-esophageal reflux disease without esophagitis: Secondary | ICD-10-CM | POA: Diagnosis not present

## 2015-04-25 DIAGNOSIS — S72002D Fracture of unspecified part of neck of left femur, subsequent encounter for closed fracture with routine healing: Secondary | ICD-10-CM | POA: Diagnosis not present

## 2015-04-25 DIAGNOSIS — Z96642 Presence of left artificial hip joint: Secondary | ICD-10-CM | POA: Diagnosis not present

## 2015-04-25 DIAGNOSIS — Z951 Presence of aortocoronary bypass graft: Secondary | ICD-10-CM | POA: Diagnosis not present

## 2015-05-01 DIAGNOSIS — H353232 Exudative age-related macular degeneration, bilateral, with inactive choroidal neovascularization: Secondary | ICD-10-CM | POA: Diagnosis not present

## 2015-05-03 DIAGNOSIS — S72002D Fracture of unspecified part of neck of left femur, subsequent encounter for closed fracture with routine healing: Secondary | ICD-10-CM | POA: Diagnosis not present

## 2015-05-03 DIAGNOSIS — I10 Essential (primary) hypertension: Secondary | ICD-10-CM | POA: Diagnosis not present

## 2015-05-03 DIAGNOSIS — K219 Gastro-esophageal reflux disease without esophagitis: Secondary | ICD-10-CM | POA: Diagnosis not present

## 2015-05-03 DIAGNOSIS — Z96642 Presence of left artificial hip joint: Secondary | ICD-10-CM | POA: Diagnosis not present

## 2015-05-03 DIAGNOSIS — Z951 Presence of aortocoronary bypass graft: Secondary | ICD-10-CM | POA: Diagnosis not present

## 2015-05-03 DIAGNOSIS — I5032 Chronic diastolic (congestive) heart failure: Secondary | ICD-10-CM | POA: Diagnosis not present

## 2015-05-03 DIAGNOSIS — I2510 Atherosclerotic heart disease of native coronary artery without angina pectoris: Secondary | ICD-10-CM | POA: Diagnosis not present

## 2015-05-03 DIAGNOSIS — D510 Vitamin B12 deficiency anemia due to intrinsic factor deficiency: Secondary | ICD-10-CM | POA: Diagnosis not present

## 2015-05-10 DIAGNOSIS — Z951 Presence of aortocoronary bypass graft: Secondary | ICD-10-CM | POA: Diagnosis not present

## 2015-05-10 DIAGNOSIS — Z96642 Presence of left artificial hip joint: Secondary | ICD-10-CM | POA: Diagnosis not present

## 2015-05-10 DIAGNOSIS — I10 Essential (primary) hypertension: Secondary | ICD-10-CM | POA: Diagnosis not present

## 2015-05-10 DIAGNOSIS — S72002D Fracture of unspecified part of neck of left femur, subsequent encounter for closed fracture with routine healing: Secondary | ICD-10-CM | POA: Diagnosis not present

## 2015-05-10 DIAGNOSIS — K219 Gastro-esophageal reflux disease without esophagitis: Secondary | ICD-10-CM | POA: Diagnosis not present

## 2015-05-10 DIAGNOSIS — D510 Vitamin B12 deficiency anemia due to intrinsic factor deficiency: Secondary | ICD-10-CM | POA: Diagnosis not present

## 2015-05-10 DIAGNOSIS — I2510 Atherosclerotic heart disease of native coronary artery without angina pectoris: Secondary | ICD-10-CM | POA: Diagnosis not present

## 2015-05-10 DIAGNOSIS — I5032 Chronic diastolic (congestive) heart failure: Secondary | ICD-10-CM | POA: Diagnosis not present

## 2015-05-16 DIAGNOSIS — I5032 Chronic diastolic (congestive) heart failure: Secondary | ICD-10-CM | POA: Diagnosis not present

## 2015-05-16 DIAGNOSIS — K219 Gastro-esophageal reflux disease without esophagitis: Secondary | ICD-10-CM | POA: Diagnosis not present

## 2015-05-16 DIAGNOSIS — D510 Vitamin B12 deficiency anemia due to intrinsic factor deficiency: Secondary | ICD-10-CM | POA: Diagnosis not present

## 2015-05-16 DIAGNOSIS — I2510 Atherosclerotic heart disease of native coronary artery without angina pectoris: Secondary | ICD-10-CM | POA: Diagnosis not present

## 2015-05-16 DIAGNOSIS — S72002D Fracture of unspecified part of neck of left femur, subsequent encounter for closed fracture with routine healing: Secondary | ICD-10-CM | POA: Diagnosis not present

## 2015-05-16 DIAGNOSIS — Z951 Presence of aortocoronary bypass graft: Secondary | ICD-10-CM | POA: Diagnosis not present

## 2015-05-16 DIAGNOSIS — Z96642 Presence of left artificial hip joint: Secondary | ICD-10-CM | POA: Diagnosis not present

## 2015-05-16 DIAGNOSIS — I10 Essential (primary) hypertension: Secondary | ICD-10-CM | POA: Diagnosis not present

## 2015-05-24 ENCOUNTER — Encounter: Payer: Self-pay | Admitting: Internal Medicine

## 2015-05-30 DIAGNOSIS — K219 Gastro-esophageal reflux disease without esophagitis: Secondary | ICD-10-CM | POA: Diagnosis not present

## 2015-05-30 DIAGNOSIS — Z951 Presence of aortocoronary bypass graft: Secondary | ICD-10-CM | POA: Diagnosis not present

## 2015-05-30 DIAGNOSIS — S72002D Fracture of unspecified part of neck of left femur, subsequent encounter for closed fracture with routine healing: Secondary | ICD-10-CM | POA: Diagnosis not present

## 2015-05-30 DIAGNOSIS — D510 Vitamin B12 deficiency anemia due to intrinsic factor deficiency: Secondary | ICD-10-CM | POA: Diagnosis not present

## 2015-05-30 DIAGNOSIS — I2510 Atherosclerotic heart disease of native coronary artery without angina pectoris: Secondary | ICD-10-CM | POA: Diagnosis not present

## 2015-05-30 DIAGNOSIS — I5032 Chronic diastolic (congestive) heart failure: Secondary | ICD-10-CM | POA: Diagnosis not present

## 2015-05-30 DIAGNOSIS — I10 Essential (primary) hypertension: Secondary | ICD-10-CM | POA: Diagnosis not present

## 2015-05-30 DIAGNOSIS — Z96642 Presence of left artificial hip joint: Secondary | ICD-10-CM | POA: Diagnosis not present

## 2015-05-31 DIAGNOSIS — H353131 Nonexudative age-related macular degeneration, bilateral, early dry stage: Secondary | ICD-10-CM | POA: Diagnosis not present

## 2015-05-31 DIAGNOSIS — H25013 Cortical age-related cataract, bilateral: Secondary | ICD-10-CM | POA: Diagnosis not present

## 2015-06-02 ENCOUNTER — Ambulatory Visit: Payer: Self-pay | Admitting: Internal Medicine

## 2015-06-13 ENCOUNTER — Ambulatory Visit
Admission: RE | Admit: 2015-06-13 | Discharge: 2015-06-13 | Disposition: A | Discharge status: Home / Self Care | Payer: Commercial Managed Care - HMO | Source: Ambulatory Visit | Attending: Internal Medicine | Admitting: Internal Medicine

## 2015-06-13 DIAGNOSIS — I10 Essential (primary) hypertension: Secondary | ICD-10-CM | POA: Diagnosis not present

## 2015-06-13 DIAGNOSIS — Z951 Presence of aortocoronary bypass graft: Secondary | ICD-10-CM | POA: Diagnosis not present

## 2015-06-13 DIAGNOSIS — I5032 Chronic diastolic (congestive) heart failure: Secondary | ICD-10-CM | POA: Diagnosis not present

## 2015-06-13 DIAGNOSIS — I2510 Atherosclerotic heart disease of native coronary artery without angina pectoris: Secondary | ICD-10-CM | POA: Diagnosis not present

## 2015-06-13 DIAGNOSIS — Z1239 Encounter for other screening for malignant neoplasm of breast: Secondary | ICD-10-CM

## 2015-06-13 DIAGNOSIS — D510 Vitamin B12 deficiency anemia due to intrinsic factor deficiency: Secondary | ICD-10-CM | POA: Diagnosis not present

## 2015-06-13 DIAGNOSIS — Z96642 Presence of left artificial hip joint: Secondary | ICD-10-CM | POA: Diagnosis not present

## 2015-06-13 DIAGNOSIS — K219 Gastro-esophageal reflux disease without esophagitis: Secondary | ICD-10-CM | POA: Diagnosis not present

## 2015-06-13 DIAGNOSIS — S72002D Fracture of unspecified part of neck of left femur, subsequent encounter for closed fracture with routine healing: Secondary | ICD-10-CM | POA: Diagnosis not present

## 2015-06-23 DIAGNOSIS — H353232 Exudative age-related macular degeneration, bilateral, with inactive choroidal neovascularization: Secondary | ICD-10-CM | POA: Diagnosis not present

## 2015-07-11 ENCOUNTER — Ambulatory Visit: Admission: RE | Admit: 2015-07-11 | Payer: Commercial Managed Care - HMO | Source: Ambulatory Visit

## 2015-07-18 ENCOUNTER — Ambulatory Visit
Admission: RE | Admit: 2015-07-18 | Discharge: 2015-07-18 | Disposition: A | Discharge status: Home / Self Care | Payer: Commercial Managed Care - HMO | Source: Ambulatory Visit | Attending: Internal Medicine | Admitting: Internal Medicine

## 2015-07-18 DIAGNOSIS — Z1231 Encounter for screening mammogram for malignant neoplasm of breast: Secondary | ICD-10-CM | POA: Insufficient documentation

## 2015-07-31 ENCOUNTER — Encounter: Payer: Self-pay | Admitting: Internal Medicine

## 2015-07-31 ENCOUNTER — Ambulatory Visit (INDEPENDENT_AMBULATORY_CARE_PROVIDER_SITE_OTHER): Payer: Commercial Managed Care - HMO | Admitting: Internal Medicine

## 2015-07-31 VITALS — BP 112/72 | HR 53 | Temp 98.0°F | Resp 12 | Ht 62.0 in | Wt 169.5 lb

## 2015-07-31 DIAGNOSIS — E038 Other specified hypothyroidism: Secondary | ICD-10-CM

## 2015-07-31 DIAGNOSIS — I10 Essential (primary) hypertension: Secondary | ICD-10-CM

## 2015-07-31 DIAGNOSIS — E559 Vitamin D deficiency, unspecified: Secondary | ICD-10-CM

## 2015-07-31 DIAGNOSIS — E669 Obesity, unspecified: Secondary | ICD-10-CM

## 2015-07-31 DIAGNOSIS — I519 Heart disease, unspecified: Secondary | ICD-10-CM

## 2015-07-31 DIAGNOSIS — D509 Iron deficiency anemia, unspecified: Secondary | ICD-10-CM | POA: Diagnosis not present

## 2015-07-31 DIAGNOSIS — Z79899 Other long term (current) drug therapy: Secondary | ICD-10-CM

## 2015-07-31 DIAGNOSIS — E034 Atrophy of thyroid (acquired): Secondary | ICD-10-CM

## 2015-07-31 DIAGNOSIS — I5189 Other ill-defined heart diseases: Secondary | ICD-10-CM

## 2015-07-31 DIAGNOSIS — N3941 Urge incontinence: Secondary | ICD-10-CM

## 2015-07-31 LAB — COMPREHENSIVE METABOLIC PANEL
ALK PHOS: 53 U/L (ref 39–117)
ALT: 11 U/L (ref 0–35)
AST: 20 U/L (ref 0–37)
Albumin: 4.1 g/dL (ref 3.5–5.2)
BILIRUBIN TOTAL: 0.5 mg/dL (ref 0.2–1.2)
BUN: 27 mg/dL — ABNORMAL HIGH (ref 6–23)
CALCIUM: 9.7 mg/dL (ref 8.4–10.5)
CO2: 30 mEq/L (ref 19–32)
CREATININE: 0.97 mg/dL (ref 0.40–1.20)
Chloride: 103 mEq/L (ref 96–112)
GFR: 57.65 mL/min — AB (ref >60.00)
GLUCOSE: 99 mg/dL (ref 70–99)
Potassium: 4.9 mEq/L (ref 3.5–5.1)
Sodium: 139 mEq/L (ref 135–145)
Total Protein: 7.1 g/dL (ref 6.0–8.3)

## 2015-07-31 LAB — CBC WITH DIFFERENTIAL/PLATELET
BASOS ABS: 0 10*3/uL (ref 0.0–0.1)
Basophils Relative: 0.7 % (ref 0.0–3.0)
Eosinophils Absolute: 0.2 10*3/uL (ref 0.0–0.7)
Eosinophils Relative: 2.4 % (ref 0.0–5.0)
HCT: 34.8 % — ABNORMAL LOW (ref 36.0–46.0)
Hemoglobin: 11.8 g/dL — ABNORMAL LOW (ref 12.0–15.0)
LYMPHS ABS: 1.6 10*3/uL (ref 0.7–4.0)
Lymphocytes Relative: 24.5 % (ref 12.0–46.0)
MCHC: 33.9 g/dL (ref 30.0–36.0)
MCV: 94.8 fl (ref 78.0–100.0)
MONO ABS: 0.6 10*3/uL (ref 0.1–1.0)
MONOS PCT: 9.8 % (ref 3.0–12.0)
NEUTROS ABS: 4.1 10*3/uL (ref 1.4–7.7)
NEUTROS PCT: 62.6 % (ref 43.0–77.0)
PLATELETS: 158 10*3/uL (ref 150.0–400.0)
RBC: 3.67 Mil/uL — ABNORMAL LOW (ref 3.87–5.11)
RDW: 14.5 % (ref 11.5–15.5)
WBC: 6.6 10*3/uL (ref 4.0–10.5)

## 2015-07-31 LAB — IRON AND TIBC
%SAT: 39 % (ref 11–50)
IRON: 104 ug/dL (ref 45–160)
TIBC: 264 ug/dL (ref 250–450)
UIBC: 160 ug/dL (ref 125–400)

## 2015-07-31 LAB — FERRITIN: FERRITIN: 24.3 ng/mL (ref 10.0–291.0)

## 2015-07-31 LAB — TSH: TSH: 2.34 u[IU]/mL (ref 0.35–4.50)

## 2015-07-31 LAB — VITAMIN D 25 HYDROXY (VIT D DEFICIENCY, FRACTURES): VITD: 40.22 ng/mL (ref 30.00–100.00)

## 2015-07-31 MED ORDER — TORSEMIDE 10 MG PO TABS: ORAL_TABLET | ORAL | Status: DC

## 2015-07-31 NOTE — Progress Notes (Signed)
Pre-visit discussion using our clinic review tool. No additional management support is needed unless otherwise documented below in the visit note.  

## 2015-07-31 NOTE — Patient Instructions (Addendum)
Your goal weight  Is 160 lbs to get your BMI under 30.  You need to have protein with every meal.  An apple is NOT PROTEIN    Premier protein  Is an excellent low carb high protein  shake .  Use instead of skipping meals    Dannon Lt n fit [place] ,yogurts are low carb, high protein.    Add whipped cream  Makes a great dessert !   You can stop the daily fluid pill.  continue to monitor  Your weight daily and take a dose if only if your overnight weight gain is 2 lbs,  or your weekly weekly weight gain is 5 lbs.

## 2015-07-31 NOTE — Progress Notes (Signed)
Subjective:  Patient ID: Jenna Marshall, female    DOB: [birth year]  Age: 82 y.o. MRN: [id number]  CC: The primary encounter diagnosis was Iron deficiency anemia. Diagnoses of Hypothyroidism due to acquired atrophy of thyroid, Vitamin D deficiency, Long-term use of high-risk medication, Urinary incontinence, urge, Obesity (BMI 30-39.9), Essential hypertension, and Diastolic dysfunction were also pertinent to this visit.  HPI Jenna Marshall presents for follow up on anemia and hypothyroidism.  At last visit her iron supplement  was suspended per her request due to intolerance of taste and she is due for repeat cbc.   Follow up on Recent left hip fracture (prior to last visit) : ambulating with a cane.  PT finished.  Pedals a bike daily but not walking outside of the home.   Has lost 5 lbs since last visit. Weighing 165 lbs at home   Daily torsemide dose causing recurrent urinary incontinancene.  weighs self daily  EF 45 to 50% by recent ECHO.  She is limiting her salt intake.   Discussed diet and weight,  Daughter [name] present.       Outpatient Prescriptions Prior to Visit  Medication Sig Dispense Refill  . acetaminophen (TYLENOL) 325 MG tablet Take 2 tablets (650 mg total) by mouth every 6 (six) hours as needed for mild pain. (Patient taking differently: Take 325-650 mg by mouth every 6 (six) hours as needed for mild pain. )    . aspirin 81 MG chewable tablet Chew 1 tablet (81 mg total) by mouth daily. 30 tablet 0  . atorvastatin (LIPITOR) 40 MG tablet Take 1 tablet (40 mg total) by mouth daily. 30 tablet 3  . Calcium Carbonate-Vitamin D (CALCIUM + D) 600-200 MG-UNIT TABS Take 1 tablet by mouth 2 (two) times daily.     . carvedilol (COREG) 3.125 MG tablet Take 3.125 mg by mouth 2 (two) times daily.    . Cholecalciferol (D3 DOTS) 2000 UNITS TBDP Take 2,000 Units by mouth.    . levothyroxine (SYNTHROID, LEVOTHROID) 25 MCG tablet TAKE ONE (1) TABLET BY MOUTH EVERY DAY FOR THYROID 30 tablet  2  . meloxicam (MOBIC) 15 MG tablet Take 1 tablet (15 mg total) by mouth daily. 30 tablet 1  . Multiple Vitamin (MULTIVITAMIN WITH MINERALS) TABS tablet Take 1 tablet by mouth daily.    . Multiple Vitamins-Minerals (PRESERVISION AREDS 2) CAPS Take 1 capsule by mouth 2 (two) times daily.     . pantoprazole (PROTONIX) 40 MG tablet TAKE 1 TABLET BY MOUTH EACH DAY 30 tablet 5  . senna-docusate (SENOKOT-S) 8.6-50 MG per tablet Take 1 tablet by mouth 2 (two) times daily. 30 tablet 0  . torsemide (DEMADEX) 10 MG tablet TAKE ONE TABLET BY MOUTH EVERY DAY 30 tablet 3  . ferrous sulfate (KP FERROUS SULFATE) 325 (65 FE) MG tablet Take 1 tablet (325 mg total) by mouth daily with breakfast. (Patient not taking: Reported on 07/31/2015) 90 tablet 0  . traMADol (ULTRAM) 50 MG tablet Take 1-2 tablets (50-100 mg total) by mouth every 6 (six) hours as needed for moderate pain. 60 tablet 3  . triamcinolone cream (KENALOG) 0.1 % Apply 1 application topically 2 (two) times daily. (Patient taking differently: Apply 1 application topically 2 (two) times daily as needed (for rash). ) 45 g 0   No facility-administered medications prior to visit.    Review of Systems;  Patient denies headache, fevers, malaise, unintentional weight loss, skin rash, eye pain, sinus congestion and sinus pain, sore throat,  dysphagia,  hemoptysis , cough, dyspnea, wheezing, chest pain, palpitations, orthopnea, edema, abdominal pain, nausea, melena, diarrhea, constipation, flank pain, dysuria, hematuria, urinary  Frequency, nocturia, numbness, tingling, seizures,  Focal weakness, Loss of consciousness,  Tremor, insomnia, depression, anxiety, and suicidal ideation.      Objective:  BP 112/72 mmHg  Pulse 53  Temp(Src) 98 F (36.7 C) (Oral)  Resp 12  Ht 5\' 2"  (1.575 m)  Wt 169 lb 8 oz (76.885 kg)  BMI 30.99 kg/m2  SpO2 99%  BP Readings from Last 3 Encounters:  07/31/15 112/72  04/19/15 142/68  04/10/15 118/68    Wt Readings from  Last 3 Encounters:  07/31/15 169 lb 8 oz (76.885 kg)  04/19/15 174 lb 3.2 oz (79.017 kg)  04/10/15 168 lb (76.204 kg)    General appearance: alert, cooperative and appears stated age Ears: normal TM's and external ear canals both ears Throat: lips, mucosa, and tongue normal; teeth and gums normal Neck: no adenopathy, no carotid bruit, supple, symmetrical, trachea midline and thyroid not enlarged, symmetric, no tenderness/mass/nodules Back: symmetric, no curvature. ROM normal. No CVA tenderness. Lungs: clear to auscultation bilaterally Heart: regular rate and rhythm, S1, S2 normal, no murmur, click, rub or gallop Abdomen: soft, non-tender; bowel sounds normal; no masses,  no organomegaly Pulses: 2+ and symmetric Skin: Skin color, texture, turgor normal. No rashes or lesions Lymph nodes: Cervical, supraclavicular, and axillary nodes normal.  No results found for: HGBA1C  Lab Results  Component Value Date   CREATININE 0.97 07/31/2015   CREATININE 0.94 03/03/2015   CREATININE 0.79 02/09/2015    Lab Results  Component Value Date   WBC 6.6 07/31/2015   HGB 11.8* 07/31/2015   HCT 34.8* 07/31/2015   PLT 158.0 07/31/2015   GLUCOSE 99 07/31/2015   CHOL 114 12/23/2014   TRIG 88 12/23/2014   HDL 40 12/23/2014   LDLDIRECT 104.4 02/27/2011   LDLCALC 56 12/23/2014   ALT 11 07/31/2015   AST 20 07/31/2015   NA 139 07/31/2015   K 4.9 07/31/2015   CL 103 07/31/2015   CREATININE 0.97 07/31/2015   BUN 27* 07/31/2015   CO2 30 07/31/2015   TSH 2.34 07/31/2015   INR 1.64* 10/14/2014    Mm Digital Screening Bilateral  07/18/2015  CLINICAL DATA:  Screening. EXAM: DIGITAL SCREENING BILATERAL MAMMOGRAM WITH CAD COMPARISON:  Previous exam(s). ACR Breast Density Category b: There are scattered areas of fibroglandular density. FINDINGS: There are no findings suspicious for malignancy. Images were processed with CAD. IMPRESSION: No mammographic evidence of malignancy. A result letter of this  screening mammogram will be mailed directly to the patient. RECOMMENDATION: Screening mammogram in one year. (Code:SM-B-01Y) BI-RADS CATEGORY  1: Negative. Electronically Signed   By: Everlean Alstrom M.D.   On: 07/18/2015 11:44    Assessment & Plan:   Problem List Items Addressed This Visit    Essential hypertension (Chronic)    Well controlled on current regimen. Renal function stable, no changes today.  Lab Results  Component Value Date   CREATININE 0.97 07/31/2015   Lab Results  Component Value Date   NA 139 07/31/2015   K 4.9 07/31/2015   CL 103 07/31/2015   CO2 30 07/31/2015         Relevant Medications   torsemide (DEMADEX) 10 MG tablet   Hypothyroidism (Chronic)    Thyroid function is WNL on current dose.  No current changes needed.   Lab Results  Component Value Date   TSH 2.34 07/31/2015  Relevant Orders   TSH (Completed)   Obesity (BMI 30-39.9)    I have congratulated her in reduction of   BMI and encouraged  Continued weight loss with goal of 10% of body weigh over the next 6 months using a low glycemic index diet and regular exercise a minimum of 5 days per week.        Urinary incontinence, urge    Aggravated by daily use of torsemide.  Daily diuretic suspended.       Diastolic dysfunction    BP is wetic  controlled..  Dc daily diuretic      Iron deficiency anemia - Primary    Resolved,  Level stable post d/c of ferrous sulfate.       Relevant Orders   Ferritin (Completed)   Iron and TIBC (Completed)   CBC with Differential/Platelet (Completed)   Long-term use of high-risk medication   Relevant Orders   Comprehensive metabolic panel (Completed)    Other Visit Diagnoses    Vitamin D deficiency        Relevant Orders    VITAMIN D 25 Hydroxy (Vit-D Deficiency, Fractures) (Completed)       I have discontinued Ms. Trawick's triamcinolone cream, traMADol, and ferrous sulfate. I have also changed her torsemide. Additionally, I am having  her maintain her Calcium Carbonate-Vitamin D, multivitamin with minerals, acetaminophen, atorvastatin, carvedilol, PRESERVISION AREDS 2, aspirin, senna-docusate, pantoprazole, meloxicam, Cholecalciferol, and levothyroxine.  Meds ordered this encounter  Medications  . torsemide (DEMADEX) 10 MG tablet    Sig: As needed for overnight weight gain of 2 lbs    Dispense:  30 tablet    Refill:  3    This prescription was filled today(04/18/2015). Any refills authorized will be placed on file.    Medications Discontinued During This Encounter  Medication Reason  . traMADol (ULTRAM) 50 MG tablet Patient Preference  . triamcinolone cream (KENALOG) 0.1 % Completed Course  . ferrous sulfate (KP FERROUS SULFATE) 325 (65 FE) MG tablet   . torsemide (DEMADEX) 10 MG tablet Reorder   A total of 25 minutes of face to face time was spent with patient more than half of which was spent in counselling about the above mentioned conditions  and coordination of care   Follow-up: No Follow-up on file.   [name], MD

## 2015-08-01 ENCOUNTER — Encounter: Payer: Self-pay | Admitting: *Deleted

## 2015-08-01 NOTE — Assessment & Plan Note (Signed)
Well controlled on current regimen. Renal function stable, no changes today.  Lab Results  Component Value Date   CREATININE 0.97 07/31/2015   Lab Results  Component Value Date   NA 139 07/31/2015   K 4.9 07/31/2015   CL 103 07/31/2015   CO2 30 07/31/2015

## 2015-08-01 NOTE — Assessment & Plan Note (Signed)
Aggravated by daily use of torsemide.  Daily diuretic suspended.

## 2015-08-01 NOTE — Assessment & Plan Note (Signed)
BP is wetic  controlled..  Dc daily diuretic

## 2015-08-01 NOTE — Assessment & Plan Note (Signed)
Thyroid function is WNL on current dose.  No current changes needed.   Lab Results  Component Value Date   TSH 2.34 07/31/2015

## 2015-08-01 NOTE — Assessment & Plan Note (Signed)
Resolved,  Level stable post d/c of ferrous sulfate.

## 2015-08-01 NOTE — Assessment & Plan Note (Signed)
I have congratulated her in reduction of   BMI and encouraged  Continued weight loss with goal of 10% of body weigh over the next 6 months using a low glycemic index diet and regular exercise a minimum of 5 days per week.    

## 2015-08-23 ENCOUNTER — Other Ambulatory Visit: Payer: Self-pay

## 2015-08-23 MED ORDER — LEVOTHYROXINE SODIUM 25 MCG PO TABS: ORAL_TABLET | ORAL | Status: DC

## 2015-09-22 ENCOUNTER — Other Ambulatory Visit: Payer: Self-pay | Admitting: Surgical

## 2015-09-22 ENCOUNTER — Encounter: Payer: Self-pay | Admitting: Cardiovascular Disease

## 2015-09-22 ENCOUNTER — Ambulatory Visit (INDEPENDENT_AMBULATORY_CARE_PROVIDER_SITE_OTHER): Payer: Commercial Managed Care - HMO | Admitting: Cardiovascular Disease

## 2015-09-22 VITALS — BP 120/60 | HR 72 | Ht 62.0 in | Wt 157.5 lb

## 2015-09-22 DIAGNOSIS — I10 Essential (primary) hypertension: Secondary | ICD-10-CM

## 2015-09-22 DIAGNOSIS — I2510 Atherosclerotic heart disease of native coronary artery without angina pectoris: Secondary | ICD-10-CM

## 2015-09-22 MED ORDER — TORSEMIDE 10 MG PO TABS: ORAL_TABLET | ORAL | Status: DC

## 2015-09-22 MED ORDER — PANTOPRAZOLE SODIUM 40 MG PO TBEC: DELAYED_RELEASE_TABLET | ORAL | Status: DC

## 2015-09-22 NOTE — Progress Notes (Signed)
Cardiology Office Note   Date:  09/22/2015   ID:  Jenna Marshall, DOB 09/09/1925, MRN [id number]  PCP:  [name], MD  Cardiologist:   [name], MD   Chief Complaint  Patient presents with  . other    6 month follow up. Meds reviewed by the patient verbally. "doing well."       History of Present Illness: Jenna Marshall is a 82 y.o. female who presents for a follow up visit regarding CAD. She is s/p 4 vessel CABG on [id number], diastolic dysfunction, hypertension, and hyperlipidemia . She was admitted to [facility] on 5/10 with a several week history of intermittent sub sternal chest pain and aching in the forearm muscles bilaterally that was occuring with exertion and at rest. She ruled out for MI. She had a high risk nuclear stress test (EF 41%) on 10/13/2014 and catheterization on 10/14/2014 showing 99% distal LM stenosis and significant LAD and RCA stenosis. An echo showed an LVEF of 0000000 and diastolic dysfunction.She underwent uregent CABG on 10/15/2014. Successful LIMA-->LAD, SVG-->RCA, and sequential SVG to Ramus and OM.  She had a left hip fracture in August after a mechanical fall which required surgery.  She is walking with a walker and a cane. She  denies any chest pain or shortness of breath. She has been doing very well overall. She takes torsemide only as needed for weight gain and leg edema but has not required that very frequently. No orthopnea or PND. She lives by herself but her family members check on her very frequently.   Past Medical History  Diagnosis Date  . Hyperlipemia   . CAD (coronary artery disease)     a. Lexiscan 10/13/14: mid anterior to apical & inf wall ischemia w/ WMA, mild to mod dep EF; b. cardiac cath 10/14/2014: ost LM to LM 50%, LM 99%, ost LAD 95%, ost LAD 95%, prox LAD to mid LAD 40%, prox RCA 30%, mid RCA 70%, Critical left main stenosis. Significant distal RCA stenosis. Heavily calcified arteries. Normal EF by echo with no significant  AS or MR. Recommend urgent CABG. cCABG x 4 (5/201  . GERD (gastroesophageal reflux disease)   . Macular degeneration   . Hyperlipidemia   . Hypertension   . Hypothyroidism   . Depression   . pernicious anemia   . Varicose veins   . CAD (coronary artery disease)     a. Lexi 10/13/14: mid ant to apical & inf wall ischemia w/ WMA, mild-mod dep EF; b. cath 10/14/14: ost LM to LM 50%, dLM 99%, ost LAD 95%, ost LAD 95%, pLAD-LAD 40%, pRCA 30%, mRCA 70%, Critical LM stenosis. Sig dRCA stenosis. Heavily calcified arteries; c. CABG x 4 (10/2014)  . Diastolic dysfunction     a. echo 10/2014: EF 55-60%, no RWMA, GR1DD, mild AI, trivial MR, mildly dilated LA, PASP normal    Past Surgical History  Procedure Laterality Date  . Cardiac catheterization Left 10/14/2014    Procedure: Left Heart Cath and Coronary Angiography;  Surgeon: Wellington Hampshire, MD;  Location: [place] [facility];  Service: Cardiovascular;  Laterality: Left;  . Coronary artery bypass graft N/A 10/14/2014    Procedure: CORONARY ARTERY BYPASS GRAFTING (CABG), ON PUMP, TIMES FOUR, USING LEFT INTERNAL MAMMARY ARTERY, RIGHT GREATER SAPHENOUS VEIN HARVESTED ENDOSCOPICALLY;  Surgeon: Gaye Pollack, MD;  Location: [place];  Service: Open Heart Surgery;  Laterality: N/A;  . Coronary artery bypass graft  10/2014    LIMA-->LAD,  SVG-->RCA, sequential SVG-->Ramus and OM  . Varicose vein surgery    . Hemorrhoid surgery    . Hip arthroplasty Left 01/14/2015    Procedure: ARTHROPLASTY BIPOLAR HIP (HEMIARTHROPLASTY);  Surgeon: Dereck Leep, MD;  Location: [facility];  Service: Orthopedics;  Laterality: Left;     Current Outpatient Prescriptions  Medication Sig Dispense Refill  . acetaminophen (TYLENOL) 325 MG tablet Take 2 tablets (650 mg total) by mouth every 6 (six) hours as needed for mild pain. (Patient taking differently: Take 325-650 mg by mouth every 6 (six) hours as needed for mild pain. )    . aspirin 81 MG chewable tablet Chew 1 tablet (81 mg  total) by mouth daily. 30 tablet 0  . atorvastatin (LIPITOR) 40 MG tablet Take 1 tablet (40 mg total) by mouth daily. 30 tablet 3  . Calcium Carbonate-Vitamin D (CALCIUM + D) 600-200 MG-UNIT TABS Take 1 tablet by mouth 2 (two) times daily.     . carvedilol (COREG) 3.125 MG tablet Take 3.125 mg by mouth 2 (two) times daily.    . Cholecalciferol (D3 DOTS) 2000 UNITS TBDP Take 2,000 Units by mouth.    . levothyroxine (SYNTHROID, LEVOTHROID) 25 MCG tablet TAKE ONE (1) TABLET BY MOUTH EVERY DAY FOR THYROID 30 tablet 2  . meloxicam (MOBIC) 15 MG tablet Take 1 tablet (15 mg total) by mouth daily. (Patient taking differently: Take 15 mg by mouth daily as needed. ) 30 tablet 1  . Multiple Vitamin (MULTIVITAMIN WITH MINERALS) TABS tablet Take 1 tablet by mouth daily.    . Multiple Vitamins-Minerals (PRESERVISION AREDS 2) CAPS Take 1 capsule by mouth 2 (two) times daily.     . pantoprazole (PROTONIX) 40 MG tablet TAKE 1 TABLET BY MOUTH EACH DAY 30 tablet 5  . senna-docusate (SENOKOT-S) 8.6-50 MG per tablet Take 1 tablet by mouth 2 (two) times daily. 30 tablet 0  . torsemide (DEMADEX) 10 MG tablet As needed for overnight weight gain of 2 lbs 30 tablet 3  . [DISCONTINUED] citalopram (CELEXA) 20 MG tablet Take 20 mg by mouth daily.       No current facility-administered medications for this visit.    Allergies:   Ambien; Codeine; and Lasix    Social History:  The patient  reports that she has never smoked. She has never used smokeless tobacco. She reports that she does not drink alcohol or use illicit drugs.   Family History:  The patient's family history includes Coronary artery disease in her brother and mother; Diabetes in her mother; Heart disease in her father.    ROS:  Please see the history of present illness.   Otherwise, review of systems are positive for none.   All other systems are reviewed and negative.    PHYSICAL EXAM: VS:  BP 120/60 mmHg  Pulse 72  Ht 5\' 2"  (1.575 m)  Wt 157 lb 8 oz  (71.442 kg)  BMI 28.80 kg/m2 , BMI Body mass index is 28.8 kg/(m^2). GEN: Well nourished, well developed, in no acute distress HEENT: normal Neck: no JVD, carotid bruits, or masses Cardiac: RRR; no  rubs, or gallops,no edema . One out of 6 systolic ejection murmur in the aortic area. Respiratory:  clear to auscultation bilaterally, normal work of breathing GI: soft, nontender, nondistended, + BS MS: no deformity or atrophy Skin: warm and dry, no rash Neuro:  Strength and sensation are intact Psych: euthymic mood, full affect   EKG:  EKG is ordered today. The ekg ordered  today demonstrates normal sinus rhythm with sinus arrhythmia.   Recent Labs: 01/23/2015: Magnesium 2.4 07/31/2015: ALT 11; BUN 27*; Creatinine, Ser 0.97; Hemoglobin 11.8*; Platelets 158.0; Potassium 4.9; Sodium 139; TSH 2.34    Lipid Panel    Component Value Date/Time   CHOL 114 12/23/2014 0826   CHOL 153 09/29/2014 0938   TRIG 88 12/23/2014 0826   HDL 40 12/23/2014 0826   HDL 50.50 09/29/2014 0938   CHOLHDL 2.9 12/23/2014 0826   CHOLHDL 3 09/29/2014 0938   VLDL 22.6 09/29/2014 0938   LDLCALC 56 12/23/2014 0826   LDLCALC 80 09/29/2014 0938   LDLDIRECT 104.4 02/27/2011 1135      Wt Readings from Last 3 Encounters:  09/22/15 157 lb 8 oz (71.442 kg)  07/31/15 169 lb 8 oz (76.885 kg)  04/19/15 174 lb 3.2 oz (79.017 kg)        ASSESSMENT AND PLAN:  1.  Coronary artery disease involving native coronary arteries without angina: She is doing extremely well with no anginal symptoms. EKG is unremarkable. Continue medical therapy.  2. Essential hypertension: Blood pressure is under excellent control on current medications.  3. Hyperlipidemia: Continue treatment with atorvastatin. Most recent LDL was 56 which is optimal.    Disposition:   FU with me in 6 months  Signed,  [name], MD  09/22/2015 2:11 PM    [place]

## 2015-09-22 NOTE — Patient Instructions (Signed)
Medication Instructions: Continue same medications.   Labwork: None.   Procedures/Testing: None.   Follow-Up: 6 months with Dr. Urijah Raynor.   Any Additional Special Instructions Will Be Listed Below (If Applicable).     If you need a refill on your cardiac medications before your next appointment, please call your pharmacy.   

## 2015-10-02 DIAGNOSIS — H353232 Exudative age-related macular degeneration, bilateral, with inactive choroidal neovascularization: Secondary | ICD-10-CM | POA: Diagnosis not present

## 2015-11-01 ENCOUNTER — Encounter: Payer: Self-pay | Admitting: Internal Medicine

## 2015-11-01 ENCOUNTER — Ambulatory Visit (INDEPENDENT_AMBULATORY_CARE_PROVIDER_SITE_OTHER): Payer: Commercial Managed Care - HMO | Admitting: Internal Medicine

## 2015-11-01 VITALS — BP 148/70 | HR 63 | Temp 98.0°F | Resp 12 | Ht 62.0 in | Wt 168.2 lb

## 2015-11-01 DIAGNOSIS — E559 Vitamin D deficiency, unspecified: Secondary | ICD-10-CM

## 2015-11-01 DIAGNOSIS — E038 Other specified hypothyroidism: Secondary | ICD-10-CM | POA: Diagnosis not present

## 2015-11-01 DIAGNOSIS — D500 Iron deficiency anemia secondary to blood loss (chronic): Secondary | ICD-10-CM

## 2015-11-01 DIAGNOSIS — I10 Essential (primary) hypertension: Secondary | ICD-10-CM | POA: Diagnosis not present

## 2015-11-01 DIAGNOSIS — E785 Hyperlipidemia, unspecified: Secondary | ICD-10-CM

## 2015-11-01 DIAGNOSIS — D492 Neoplasm of unspecified behavior of bone, soft tissue, and skin: Secondary | ICD-10-CM

## 2015-11-01 DIAGNOSIS — E034 Atrophy of thyroid (acquired): Secondary | ICD-10-CM | POA: Diagnosis not present

## 2015-11-01 DIAGNOSIS — D509 Iron deficiency anemia, unspecified: Secondary | ICD-10-CM

## 2015-11-01 DIAGNOSIS — E669 Obesity, unspecified: Secondary | ICD-10-CM

## 2015-11-01 DIAGNOSIS — D485 Neoplasm of uncertain behavior of skin: Secondary | ICD-10-CM

## 2015-11-01 LAB — COMPREHENSIVE METABOLIC PANEL
ALBUMIN: 4 g/dL (ref 3.5–5.2)
ALK PHOS: 54 U/L (ref 39–117)
ALT: 10 U/L (ref 0–35)
AST: 17 U/L (ref 0–37)
BUN: 20 mg/dL (ref 6–23)
CHLORIDE: 103 meq/L (ref 96–112)
CO2: 30 mEq/L (ref 19–32)
Calcium: 9.3 mg/dL (ref 8.4–10.5)
Creatinine, Ser: 0.87 mg/dL (ref 0.40–1.20)
GFR: 65.33 mL/min (ref >60.00)
Glucose, Bld: 83 mg/dL (ref 70–99)
POTASSIUM: 4.5 meq/L (ref 3.5–5.1)
Sodium: 138 mEq/L (ref 135–145)
TOTAL PROTEIN: 6.5 g/dL (ref 6.0–8.3)
Total Bilirubin: 0.5 mg/dL (ref 0.2–1.2)

## 2015-11-01 LAB — LIPID PANEL
CHOLESTEROL: 139 mg/dL (ref 0–200)
HDL: 44.4 mg/dL (ref >39.00)
LDL Cholesterol: 76 mg/dL (ref 0–99)
NonHDL: 94.6
TRIGLYCERIDES: 95 mg/dL (ref 0.0–149.0)
Total CHOL/HDL Ratio: 3
VLDL: 19 mg/dL (ref 0.0–40.0)

## 2015-11-01 LAB — CBC WITH DIFFERENTIAL/PLATELET
BASOS PCT: 0.6 % (ref 0.0–3.0)
Basophils Absolute: 0.1 10*3/uL (ref 0.0–0.1)
EOS PCT: 2.1 % (ref 0.0–5.0)
Eosinophils Absolute: 0.2 10*3/uL (ref 0.0–0.7)
HCT: 35.3 % — ABNORMAL LOW (ref 36.0–46.0)
Hemoglobin: 11.7 g/dL — ABNORMAL LOW (ref 12.0–15.0)
LYMPHS ABS: 1.8 10*3/uL (ref 0.7–4.0)
Lymphocytes Relative: 21.6 % (ref 12.0–46.0)
MCHC: 33.3 g/dL (ref 30.0–36.0)
MCV: 94.5 fl (ref 78.0–100.0)
MONOS PCT: 10.6 % (ref 3.0–12.0)
Monocytes Absolute: 0.9 10*3/uL (ref 0.1–1.0)
NEUTROS ABS: 5.4 10*3/uL (ref 1.4–7.7)
NEUTROS PCT: 65.1 % (ref 43.0–77.0)
PLATELETS: 158 10*3/uL (ref 150.0–400.0)
RBC: 3.74 Mil/uL — AB (ref 3.87–5.11)
RDW: 14.5 % (ref 11.5–15.5)
WBC: 8.3 10*3/uL (ref 4.0–10.5)

## 2015-11-01 LAB — TSH: TSH: 2.61 u[IU]/mL (ref 0.35–4.50)

## 2015-11-01 LAB — VITAMIN D 25 HYDROXY (VIT D DEFICIENCY, FRACTURES): VITD: 50.54 ng/mL (ref 30.00–100.00)

## 2015-11-01 MED ORDER — MELOXICAM 15 MG PO TABS: 15.0000 mg | ORAL_TABLET | Freq: Every day | ORAL | Status: DC | PRN

## 2015-11-01 NOTE — Progress Notes (Signed)
Subjective:  Patient ID: Jenna Marshall, female    DOB: 07-27-1925  Age: 82 y.o. MRN: [id number]  CC: The primary encounter diagnosis was Hyperlipidemia. Diagnoses of Essential hypertension, Hypothyroidism due to acquired atrophy of thyroid, Vitamin D deficiency, Iron deficiency anemia due to chronic blood loss, Neoplasm of skin of earlobe, Obesity (BMI 30-39.9), and Iron deficiency anemia were also pertinent to this visit.  HPI [name] presents for follow up on multiple issues, including hypertension, hypothyroidism , and arthritis.  She feels generally well ,  Is sleeping well , denies constipation and bladder incontinence. Appetite better since stopping iron supplements. Energy level ok. .   Using meloxicam prn for arthritis left leg needs refills.  Has had no falls using cane and brings walker with her to visit.   The skin on her left ear HAS  BEEN SCABBING OVER  FOR 3 MONTHS,  HAS NOT SEEN DERMATOLOGY.   Lab Results  Component Value Date   CHOL 139 11/01/2015   HDL 44.40 11/01/2015   LDLCALC 76 11/01/2015   LDLDIRECT 104.4 02/27/2011   TRIG 95.0 11/01/2015   CHOLHDL 3 11/01/2015    Outpatient Prescriptions Prior to Visit  Medication Sig Dispense Refill  . acetaminophen (TYLENOL) 325 MG tablet Take 2 tablets (650 mg total) by mouth every 6 (six) hours as needed for mild pain. (Patient taking differently: Take 325-650 mg by mouth every 6 (six) hours as needed for mild pain. )    . aspirin 81 MG chewable tablet Chew 1 tablet (81 mg total) by mouth daily. 30 tablet 0  . atorvastatin (LIPITOR) 40 MG tablet Take 1 tablet (40 mg total) by mouth daily. 30 tablet 3  . Calcium Carbonate-Vitamin D (CALCIUM + D) 600-200 MG-UNIT TABS Take 1 tablet by mouth 2 (two) times daily.     . carvedilol (COREG) 3.125 MG tablet Take 3.125 mg by mouth 2 (two) times daily.    . Cholecalciferol (D3 DOTS) 2000 UNITS TBDP Take 2,000 Units by mouth.    . levothyroxine (SYNTHROID, LEVOTHROID) 25 MCG  tablet TAKE ONE (1) TABLET BY MOUTH EVERY DAY FOR THYROID 30 tablet 2  . Multiple Vitamin (MULTIVITAMIN WITH MINERALS) TABS tablet Take 1 tablet by mouth daily.    . Multiple Vitamins-Minerals (PRESERVISION AREDS 2) CAPS Take 1 capsule by mouth 2 (two) times daily.     . pantoprazole (PROTONIX) 40 MG tablet TAKE 1 TABLET BY MOUTH EACH DAY 30 tablet 5  . senna-docusate (SENOKOT-S) 8.6-50 MG per tablet Take 1 tablet by mouth 2 (two) times daily. 30 tablet 0  . torsemide (DEMADEX) 10 MG tablet As needed for overnight weight gain of 2 lbs 30 tablet 3  . meloxicam (MOBIC) 15 MG tablet Take 1 tablet (15 mg total) by mouth daily. (Patient taking differently: Take 15 mg by mouth daily as needed. ) 30 tablet 1   No facility-administered medications prior to visit.    Review of Systems;  Patient denies headache, fevers, malaise, unintentional weight loss, skin rash, eye pain, sinus congestion and sinus pain, sore throat, dysphagia,  hemoptysis , cough, dyspnea, wheezing, chest pain, palpitations, orthopnea, edema, abdominal pain, nausea, melena, diarrhea, constipation, flank pain, dysuria, hematuria, urinary  Frequency, nocturia, numbness, tingling, seizures,  Focal weakness, Loss of consciousness,  Tremor, insomnia, depression, anxiety, and suicidal ideation.      Objective:  BP 148/70 mmHg  Pulse 63  Temp(Src) 98 F (36.7 C) (Oral)  Resp 12  Ht 5\' 2"  (1.575 m)  Wt 168 lb 4 oz (76.318 kg)  BMI 30.77 kg/m2  SpO2 96%  BP Readings from Last 3 Encounters:  11/01/15 148/70  09/22/15 120/60  07/31/15 112/72    Wt Readings from Last 3 Encounters:  11/01/15 168 lb 4 oz (76.318 kg)  09/22/15 157 lb 8 oz (71.442 kg)  07/31/15 169 lb 8 oz (76.885 kg)    General appearance: alert, cooperative and appears stated age Ears: normal TM's and external ear canals both ears. Left helix with nonhealing ulcer  Throat: lips, mucosa, and tongue normal; teeth and gums normal Neck: no adenopathy, no carotid  bruit, supple, symmetrical, trachea midline and thyroid not enlarged, symmetric, no tenderness/mass/nodules Back: symmetric, no curvature. ROM normal. No CVA tenderness. Lungs: clear to auscultation bilaterally Heart: regular rate and rhythm, S1, S2 normal, no murmur, click, rub or gallop Abdomen: soft, non-tender; bowel sounds normal; no masses,  no organomegaly Pulses: 2+ and symmetric Skin: Skin color, texture, turgor normal. No rashes or lesions Lymph nodes: Cervical, supraclavicular, and axillary nodes normal.  No results found for: HGBA1C  Lab Results  Component Value Date   CREATININE 0.87 11/01/2015   CREATININE 0.97 07/31/2015   CREATININE 0.94 03/03/2015    Lab Results  Component Value Date   WBC 8.3 11/01/2015   HGB 11.7* 11/01/2015   HCT 35.3* 11/01/2015   PLT 158.0 11/01/2015   GLUCOSE 83 11/01/2015   CHOL 139 11/01/2015   TRIG 95.0 11/01/2015   HDL 44.40 11/01/2015   LDLDIRECT 104.4 02/27/2011   LDLCALC 76 11/01/2015   ALT 10 11/01/2015   AST 17 11/01/2015   NA 138 11/01/2015   K 4.5 11/01/2015   CL 103 11/01/2015   CREATININE 0.87 11/01/2015   BUN 20 11/01/2015   CO2 30 11/01/2015   TSH 2.61 11/01/2015   INR 1.64* 10/14/2014    Mm Digital Screening Bilateral  07/18/2015  CLINICAL DATA:  Screening. EXAM: DIGITAL SCREENING BILATERAL MAMMOGRAM WITH CAD COMPARISON:  Previous exam(s). ACR Breast Density Category b: There are scattered areas of fibroglandular density. FINDINGS: There are no findings suspicious for malignancy. Images were processed with CAD. IMPRESSION: No mammographic evidence of malignancy. A result letter of this screening mammogram will be mailed directly to the patient. RECOMMENDATION: Screening mammogram in one year. (Code:SM-B-01Y) BI-RADS CATEGORY  1: Negative. Electronically Signed   By: Everlean Alstrom M.D.   On: 07/18/2015 11:44    Assessment & Plan:   Problem List Items Addressed This Visit    Essential hypertension (Chronic)     Mild elevation noted today,  But her duaghter is an Therapist, sports and checks BP at home .readings have been lower.  Continue carvedilol 3.125 mg bid .        Relevant Orders   Comprehensive metabolic panel (Completed)   Hyperlipidemia - Primary (Chronic)    Tolerating atorvastatin 40 mg daily . LDL is at goal and l LFTs have been normal.  Lab Results  Component Value Date   CHOL 139 11/01/2015   HDL 44.40 11/01/2015   LDLCALC 76 11/01/2015   LDLDIRECT 104.4 02/27/2011   TRIG 95.0 11/01/2015   CHOLHDL 3 11/01/2015   Lab Results  Component Value Date   ALT 10 11/01/2015   AST 17 11/01/2015   ALKPHOS 54 11/01/2015   BILITOT 0.5 11/01/2015              Relevant Orders   Lipid panel (Completed)   Hypothyroidism (Chronic)    Thyroid function is WNL on current  dose.  No current changes needed.   Lab Results  Component Value Date   TSH 2.61 11/01/2015         Relevant Orders   TSH (Completed)   Obesity (BMI 30-39.9)    I have addressed  BMI and recommended a low glycemic index diet utilizing smaller more frequent meals to increase metabolism.  I have also recommended that patient start exercising with a goal of 30 minutes of aerobic exercise a minimum of 5 days per week.         Iron deficiency anemia    Occurred post operatively from hip surgery. Resolved with iron supplementation which was stopped due to intolerance.  Lab Results  Component Value Date   WBC 8.3 11/01/2015   HGB 11.7* 11/01/2015   HCT 35.3* 11/01/2015   MCV 94.5 11/01/2015   PLT 158.0 11/01/2015         Neoplasm of skin of earlobe    Suspected to be a squamous cell CA . Referral to Dermatology       Relevant Orders   Ambulatory referral to Dermatology    Other Visit Diagnoses    Vitamin D deficiency        Relevant Orders    VITAMIN D 25 Hydroxy (Vit-D Deficiency, Fractures) (Completed)    Iron deficiency anemia due to chronic blood loss        Relevant Orders    CBC with  Differential/Platelet (Completed)     A total of 25 minutes of face to face time was spent with patient more than half of which was spent in counselling about the above mentioned conditions  and coordination of care   I have changed Ms. Depaul's meloxicam. I am also having her maintain her Calcium Carbonate-Vitamin D, multivitamin with minerals, acetaminophen, atorvastatin, carvedilol, PRESERVISION AREDS 2, aspirin, senna-docusate, Cholecalciferol, levothyroxine, pantoprazole, and torsemide.  Meds ordered this encounter  Medications  . meloxicam (MOBIC) 15 MG tablet    Sig: Take 1 tablet (15 mg total) by mouth daily as needed.    Dispense:  30 tablet    Refill:  5    Medications Discontinued During This Encounter  Medication Reason  . meloxicam (MOBIC) 15 MG tablet Reorder    Follow-up: Return in about 6 months (around 05/02/2016) for wellness.   [name], MD

## 2015-11-01 NOTE — Progress Notes (Signed)
Pre-visit discussion using our clinic review tool. No additional management support is needed unless otherwise documented below in the visit note.  

## 2015-11-01 NOTE — Patient Instructions (Addendum)
You need to see a dermatologist about THE PLACE ON YOUR LEFT EAR .  We will start with Dr Phillip Heal if she is still in [employer].  Your weight is stable,  But I would still like you to use 5 lbs ober the next 6 months.  Cut back on instant oatmeal for breakfast. Eat more eggs . Less oatmeal grits and cereal   [name] now makes a frozen breakfast frittata that can be microwaved in 2 minutes and is very low carb. Frittatas are similar to quiches without the crust.   Make sure you are getting 20 minutes of exercise daily .

## 2015-11-03 ENCOUNTER — Encounter: Payer: Self-pay | Admitting: Internal Medicine

## 2015-11-03 DIAGNOSIS — D492 Neoplasm of unspecified behavior of bone, soft tissue, and skin: Secondary | ICD-10-CM | POA: Insufficient documentation

## 2015-11-03 NOTE — Assessment & Plan Note (Signed)
Thyroid function is WNL on current dose.  No current changes needed.   Lab Results  Component Value Date   TSH 2.61 11/01/2015

## 2015-11-03 NOTE — Assessment & Plan Note (Signed)
Suspected to be a squamous cell CA . Referral to Dermatology

## 2015-11-03 NOTE — Assessment & Plan Note (Signed)
Tolerating atorvastatin 40 mg daily . LDL is at goal and l LFTs have been normal.  Lab Results  Component Value Date   CHOL 139 11/01/2015   HDL 44.40 11/01/2015   LDLCALC 76 11/01/2015   LDLDIRECT 104.4 02/27/2011   TRIG 95.0 11/01/2015   CHOLHDL 3 11/01/2015   Lab Results  Component Value Date   ALT 10 11/01/2015   AST 17 11/01/2015   ALKPHOS 54 11/01/2015   BILITOT 0.5 11/01/2015

## 2015-11-03 NOTE — Assessment & Plan Note (Addendum)
Occurred post operatively from hip surgery. Resolved with iron supplementation which was stopped due to intolerance.  Lab Results  Component Value Date   WBC 8.3 11/01/2015   HGB 11.7* 11/01/2015   HCT 35.3* 11/01/2015   MCV 94.5 11/01/2015   PLT 158.0 11/01/2015

## 2015-11-03 NOTE — Assessment & Plan Note (Signed)
Mild elevation noted today,  But her duaghter is an Therapist, sports and checks BP at home .readings have been lower.  Continue carvedilol 3.125 mg bid .

## 2015-11-03 NOTE — Assessment & Plan Note (Addendum)
I have addressed  BMI and recommended a low glycemic index diet utilizing smaller more frequent meals to increase metabolism.  I have also recommended that patient start exercising with a goal of 30 minutes of aerobic exercise a minimum of 5 days per week.  

## 2015-11-15 IMAGING — CR DG CHEST 1V PORT
1 series · 1 of 1 positions shown · non-contrast
Comparison: 10/16/2014.

CLINICAL DATA: Status post CABG surgery.  Follow-up exam.

EXAM:
PORTABLE CHEST - 1 VIEW

[AP]
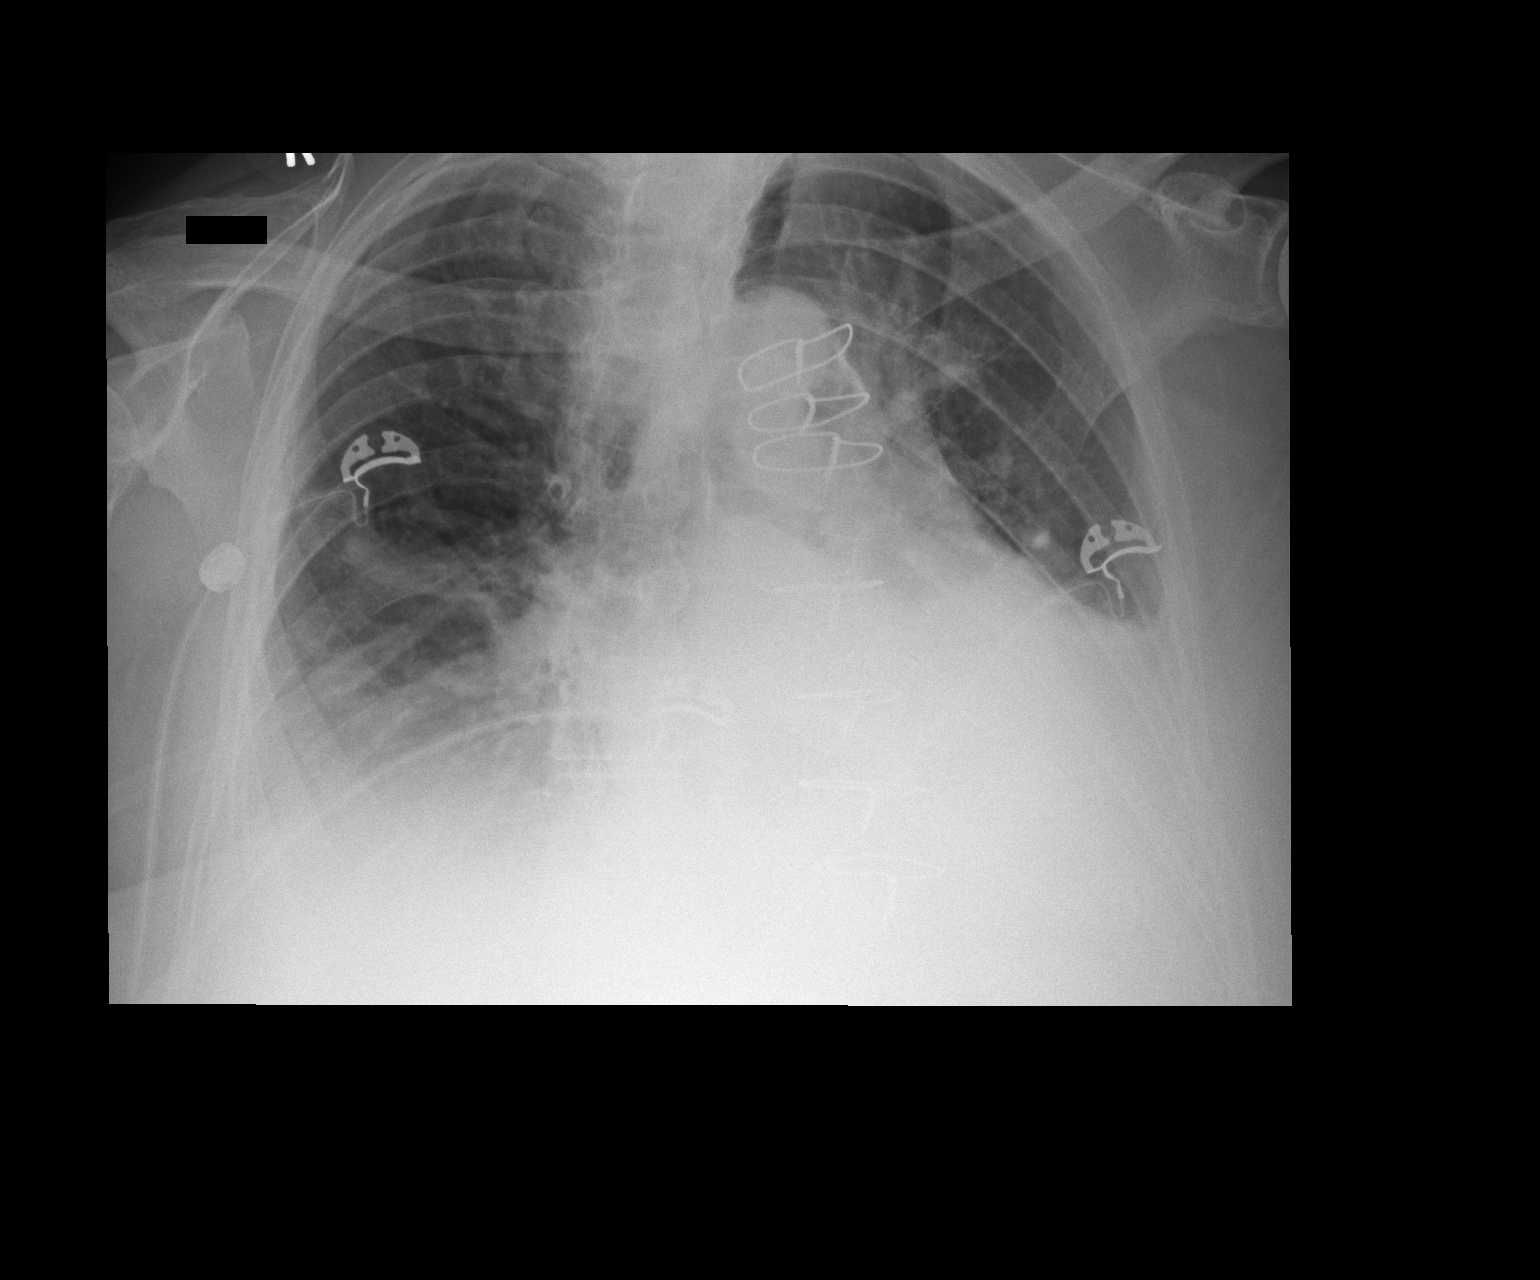

[1 of 1 positions shown; findings below may reference images not displayed]

FINDINGS: Increased opacity has developed at the lung bases consistent with
enlarged pleural effusions. There is associated atelectasis. This is
greater on the left than the right. No convincing pulmonary edema.
No pneumothorax.

Cardiac silhouette is mildly enlarged. No mediastinal widening. No
mediastinal or hilar masses.
IMPRESSION: 1. Increased bilateral pleural effusions with associated
atelectasis, left greater than right. No pulmonary edema.

## 2015-11-28 ENCOUNTER — Telehealth: Payer: Self-pay | Admitting: Internal Medicine

## 2015-11-28 MED ORDER — LEVOTHYROXINE SODIUM 25 MCG PO TABS: ORAL_TABLET | ORAL | Status: DC

## 2015-11-28 NOTE — Telephone Encounter (Signed)
Refilled, thanks

## 2015-11-28 NOTE — Telephone Encounter (Signed)
Pt's pharmacy has been trying to contact office through fax about refilling her levothyroxine (SYNTHROID, LEVOTHROID) 25 MCG tablet. Please call [facility] at [phone].

## 2015-11-29 ENCOUNTER — Other Ambulatory Visit: Payer: Self-pay

## 2015-12-15 DIAGNOSIS — L578 Other skin changes due to chronic exposure to nonionizing radiation: Secondary | ICD-10-CM | POA: Diagnosis not present

## 2015-12-15 DIAGNOSIS — D1801 Hemangioma of skin and subcutaneous tissue: Secondary | ICD-10-CM | POA: Diagnosis not present

## 2015-12-15 DIAGNOSIS — L821 Other seborrheic keratosis: Secondary | ICD-10-CM | POA: Diagnosis not present

## 2015-12-15 DIAGNOSIS — L570 Actinic keratosis: Secondary | ICD-10-CM | POA: Diagnosis not present

## 2015-12-20 IMAGING — CR DG CHEST 2V
2 series · 2 of 2 positions shown · non-contrast
Comparison: 10/19/2014 and earlier

CLINICAL DATA: 88-year-old female status post CABG [REDACTED] with
generalized weakness. Subsequent encounter.

EXAM:
CHEST  2 VIEW

[w chest lat]
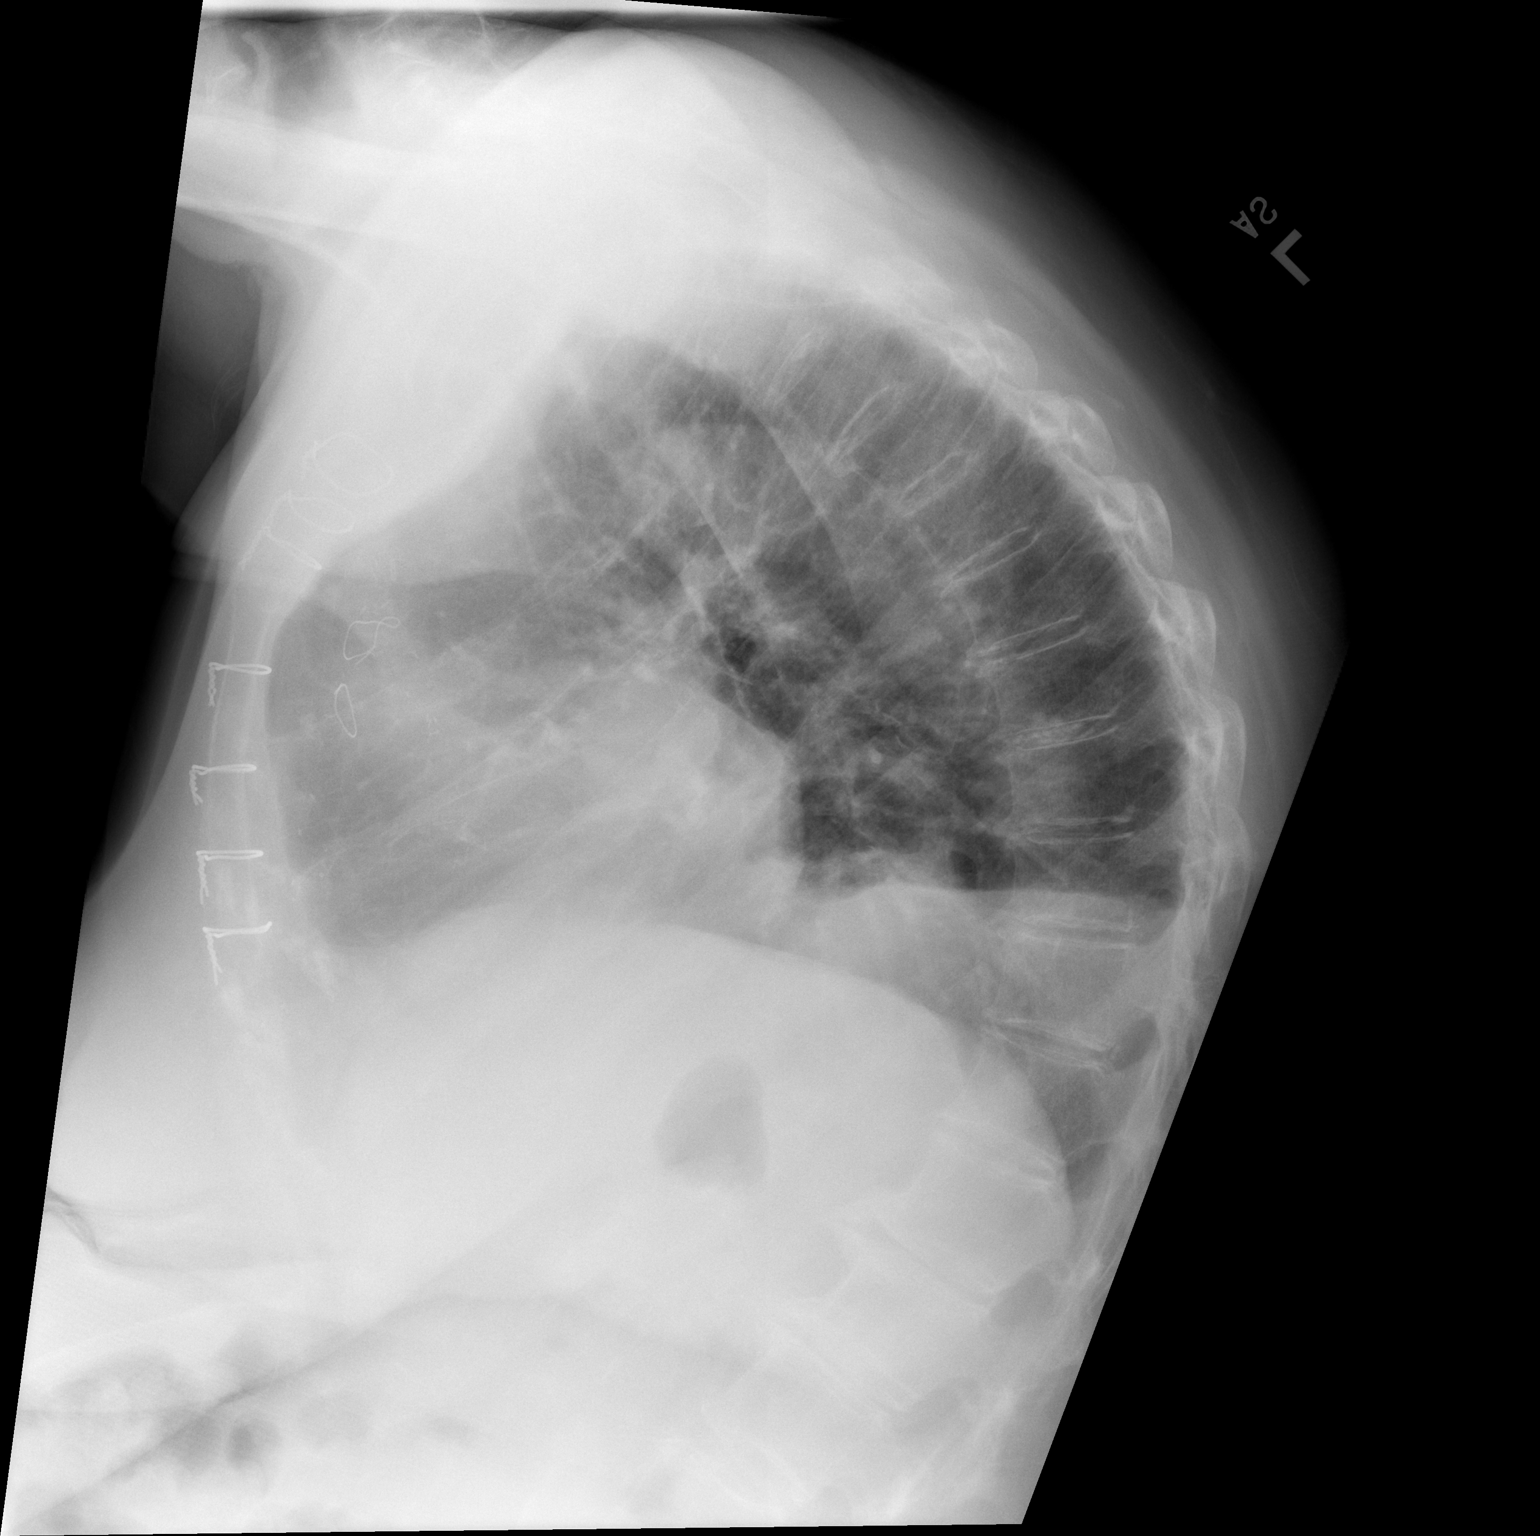

[w chest ap]
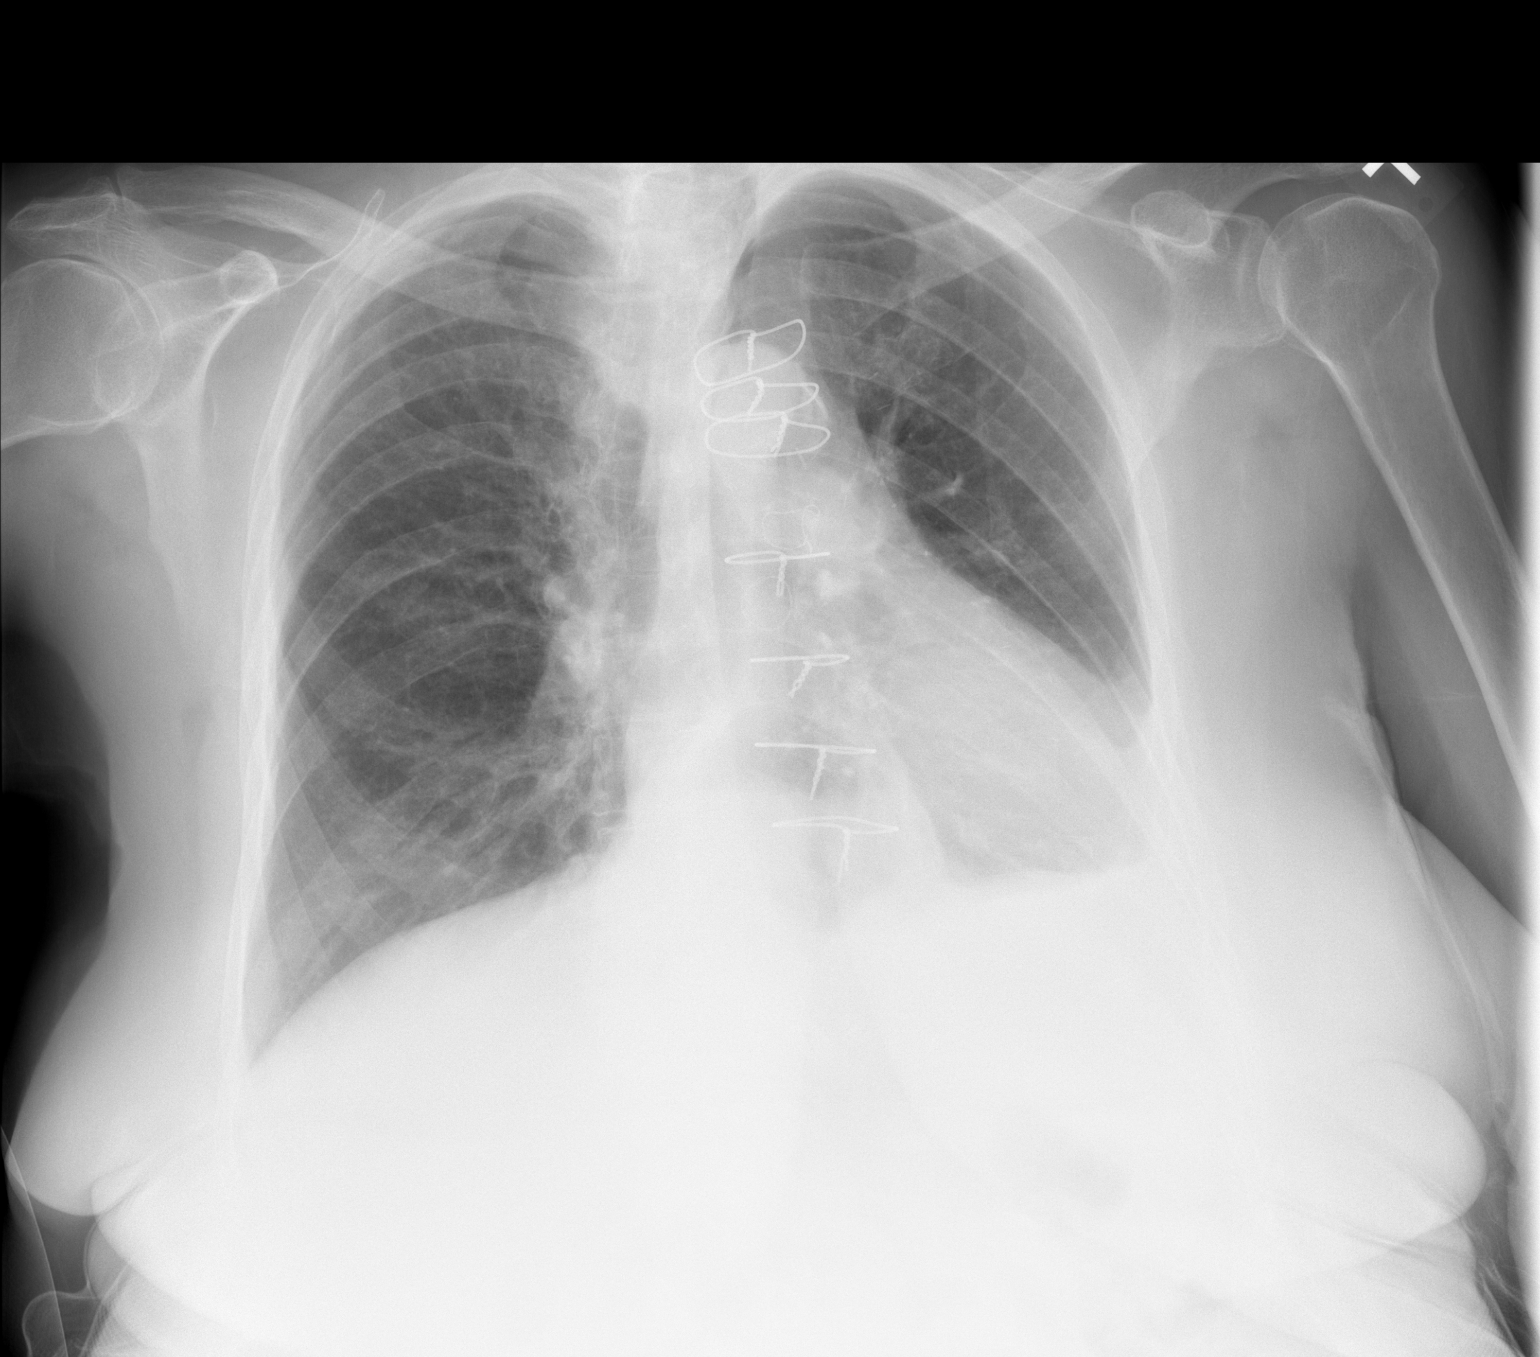

[2 of 2 positions shown; findings below may reference images not displayed]

FINDINGS: Left pleural effusion persists but has regressed. Bilateral lung
base ventilation is improved. Stable sequelae of CABG. Stable
cardiomegaly and mediastinal contours. No pneumothorax or pulmonary
edema. No right pleural effusion.

Moderate-sized gastric hiatal hernia. Calcified atherosclerosis of
the aorta. No acute osseous abnormality identified.
IMPRESSION: Left pleural effusion persists but has regressed. Improved bibasilar
ventilation. No new cardiopulmonary abnormality.

## 2015-12-25 ENCOUNTER — Other Ambulatory Visit: Payer: Self-pay

## 2015-12-25 MED ORDER — ATORVASTATIN CALCIUM 40 MG PO TABS
40.0000 mg | ORAL_TABLET | Freq: Every day | ORAL | 6 refills | Status: DC
Start: 2015-12-25 — End: 2016-07-16

## 2015-12-29 ENCOUNTER — Other Ambulatory Visit: Payer: Self-pay | Admitting: Internal Medicine

## 2015-12-29 NOTE — Telephone Encounter (Signed)
LOV 11/01/2015. Renaldo Fiddler, CMA

## 2016-01-05 ENCOUNTER — Other Ambulatory Visit: Payer: Self-pay | Admitting: Internal Medicine

## 2016-01-22 ENCOUNTER — Other Ambulatory Visit: Payer: Self-pay | Admitting: Internal Medicine

## 2016-02-06 DIAGNOSIS — H353133 Nonexudative age-related macular degeneration, bilateral, advanced atrophic without subfoveal involvement: Secondary | ICD-10-CM | POA: Diagnosis not present

## 2016-02-08 DIAGNOSIS — Z1283 Encounter for screening for malignant neoplasm of skin: Secondary | ICD-10-CM | POA: Diagnosis not present

## 2016-02-08 DIAGNOSIS — L821 Other seborrheic keratosis: Secondary | ICD-10-CM | POA: Diagnosis not present

## 2016-02-08 DIAGNOSIS — D1801 Hemangioma of skin and subcutaneous tissue: Secondary | ICD-10-CM | POA: Diagnosis not present

## 2016-02-09 IMAGING — CR DG HIP (WITH OR WITHOUT PELVIS) 2-3V*L*
2 series · 4 of 4 positions shown · non-contrast
Comparison: None.

CLINICAL DATA: Patient fell today at home landing on her left hip
and now has left hip pain, decreased ROM, and is unable to stand or
walk. Patient has also recently had heart surgery.

EXAM:
DG HIP (WITH OR WITHOUT PELVIS) 2-3V LEFT

[Series 2: hip lat · 0.14mm/px · 2 of 2 slices shown]
[im 1/2]
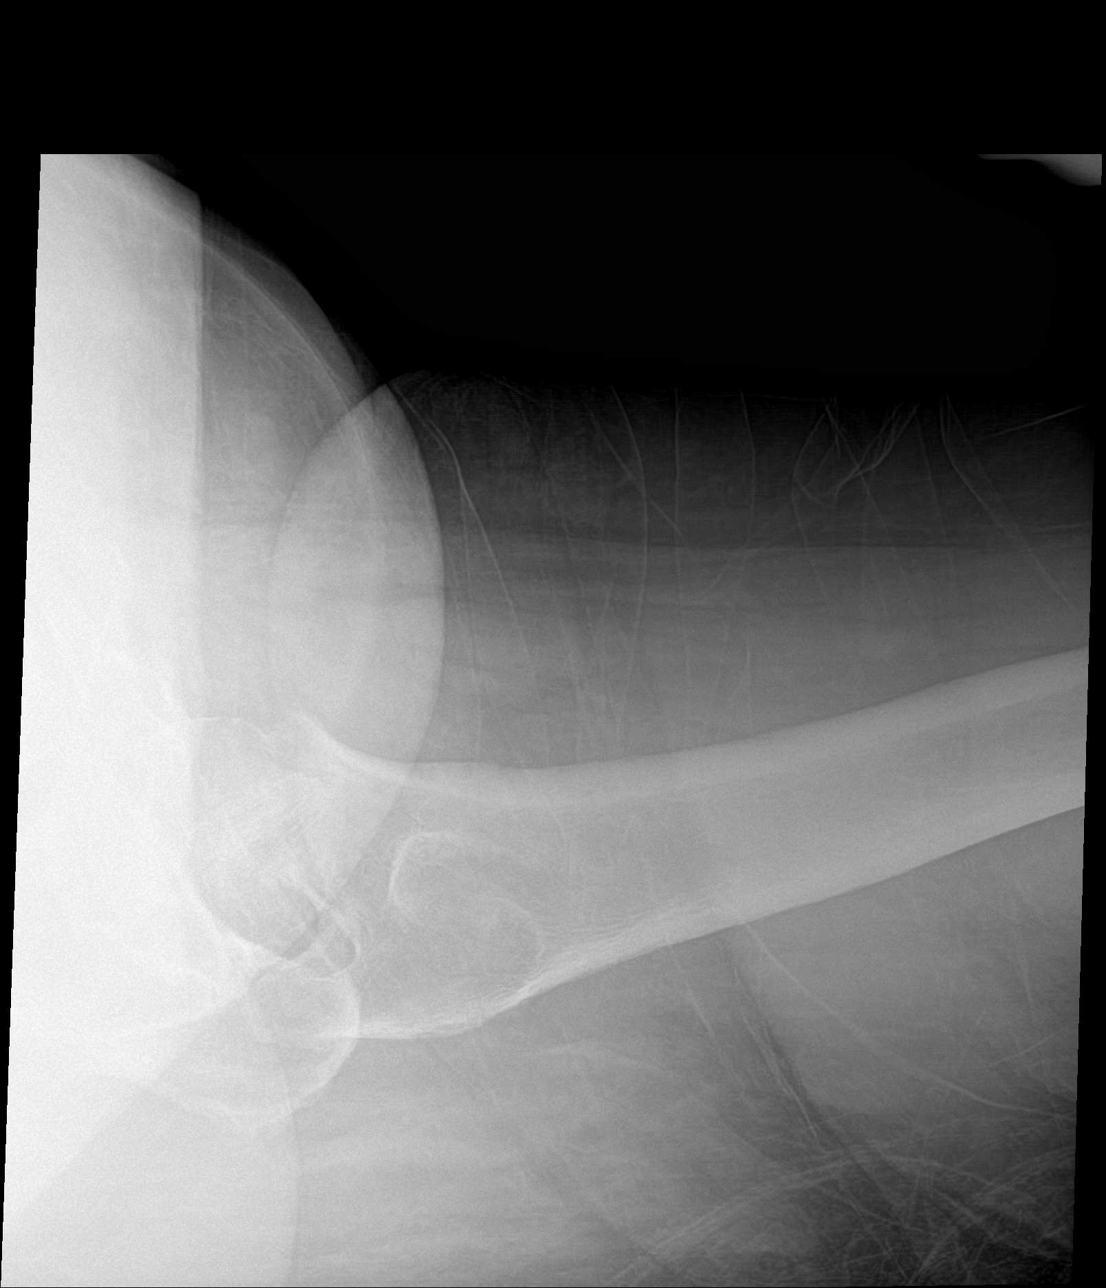
[im 2/2]
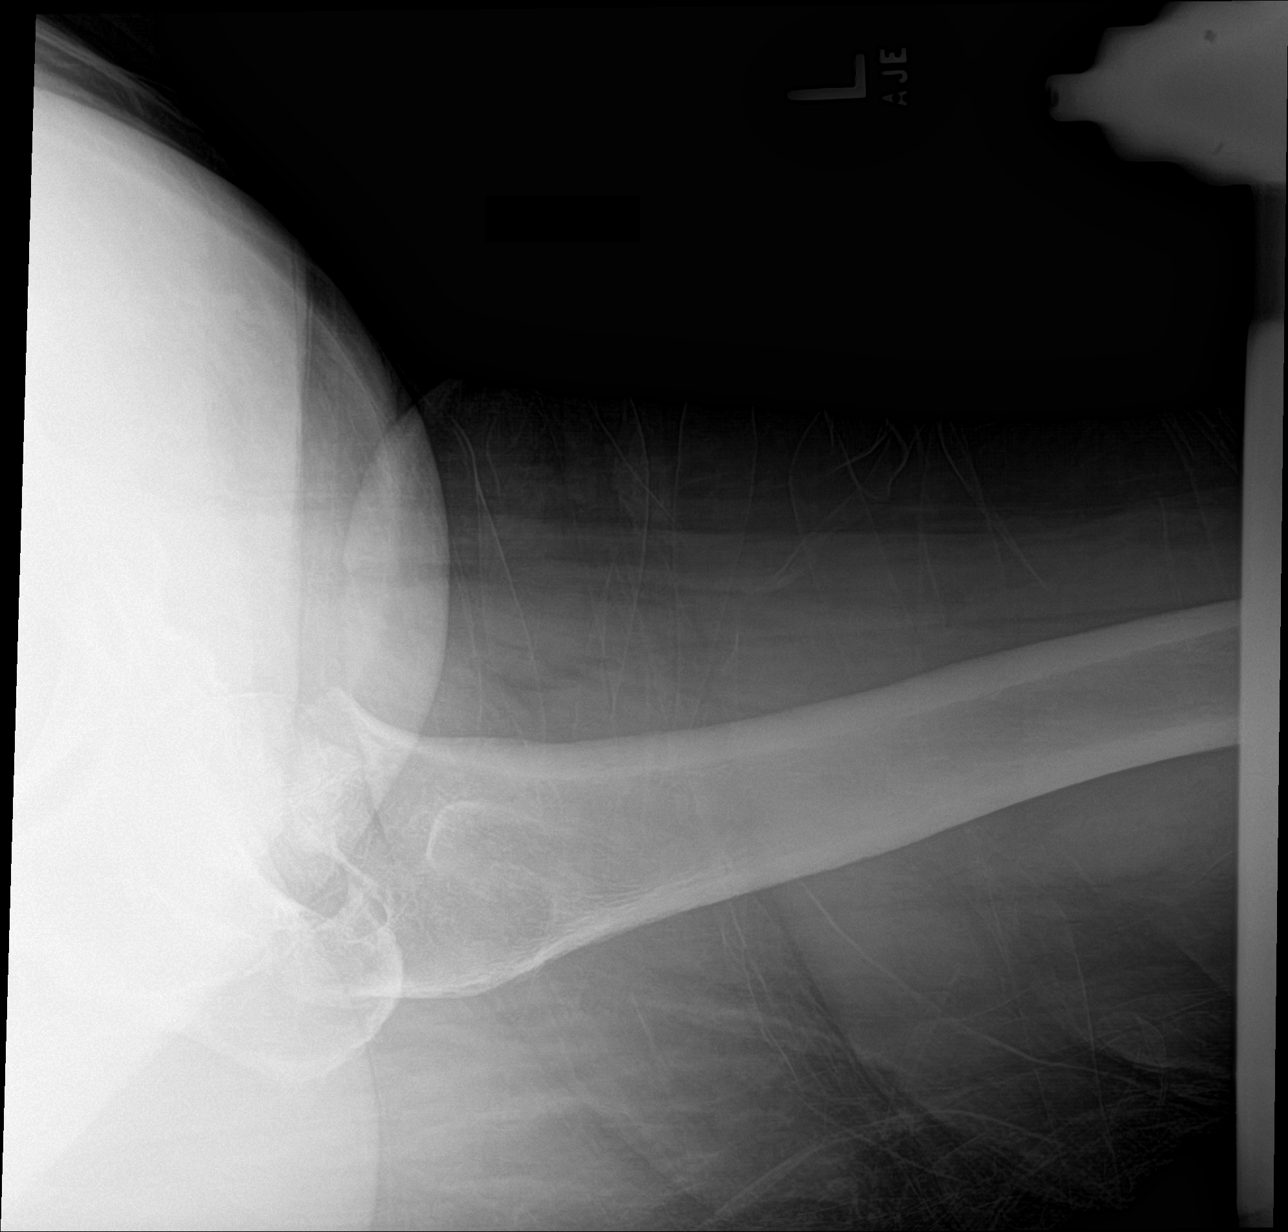

[Series 3: pelvis ap · 0.14mm/px · 2 of 2 slices shown]
[im 1/2]
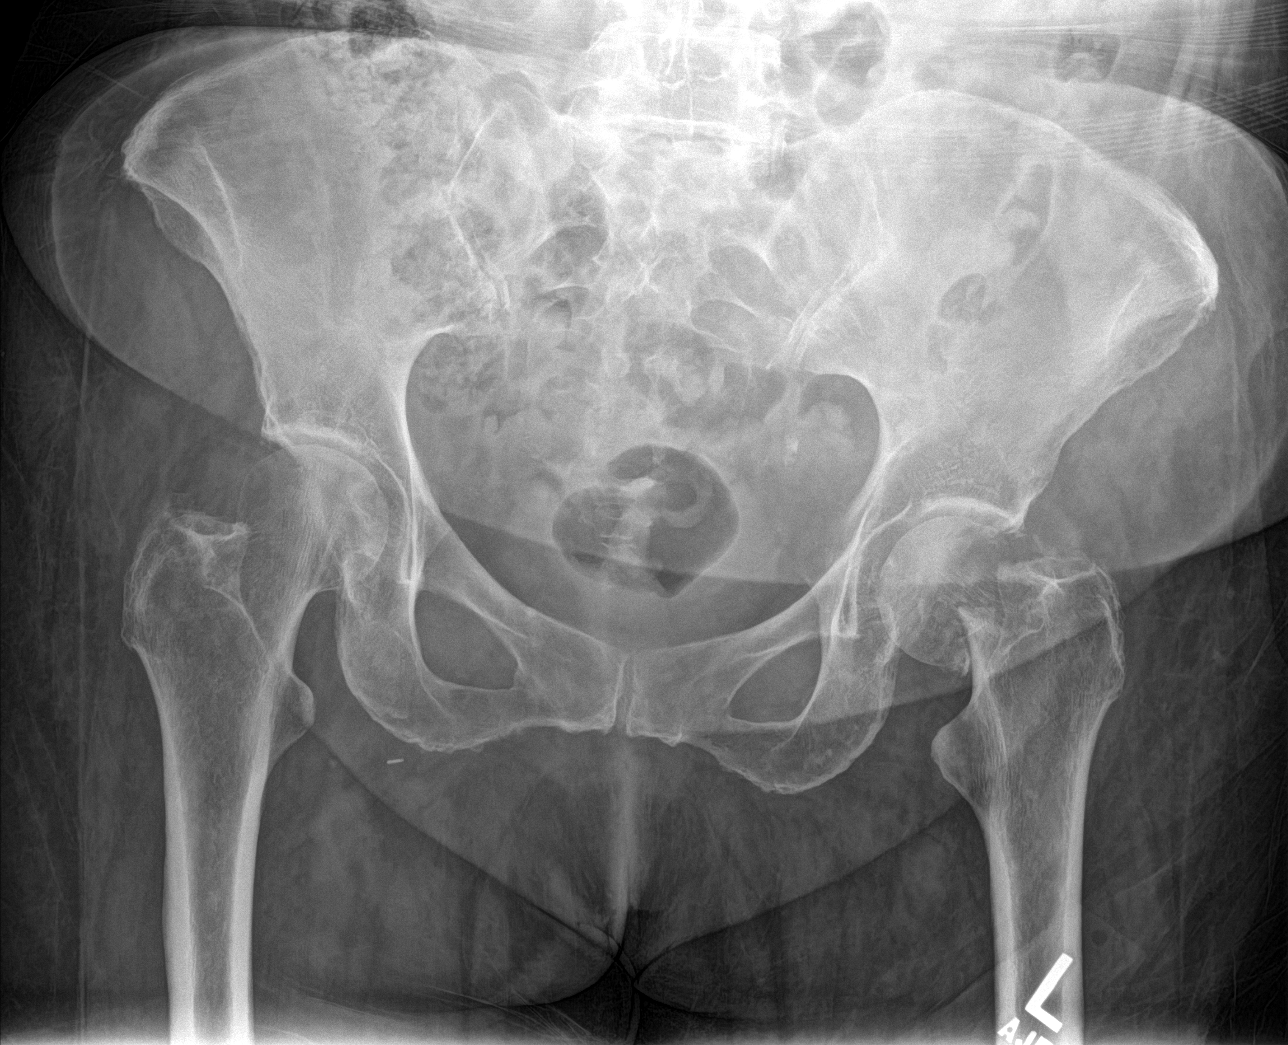
[im 2/2]
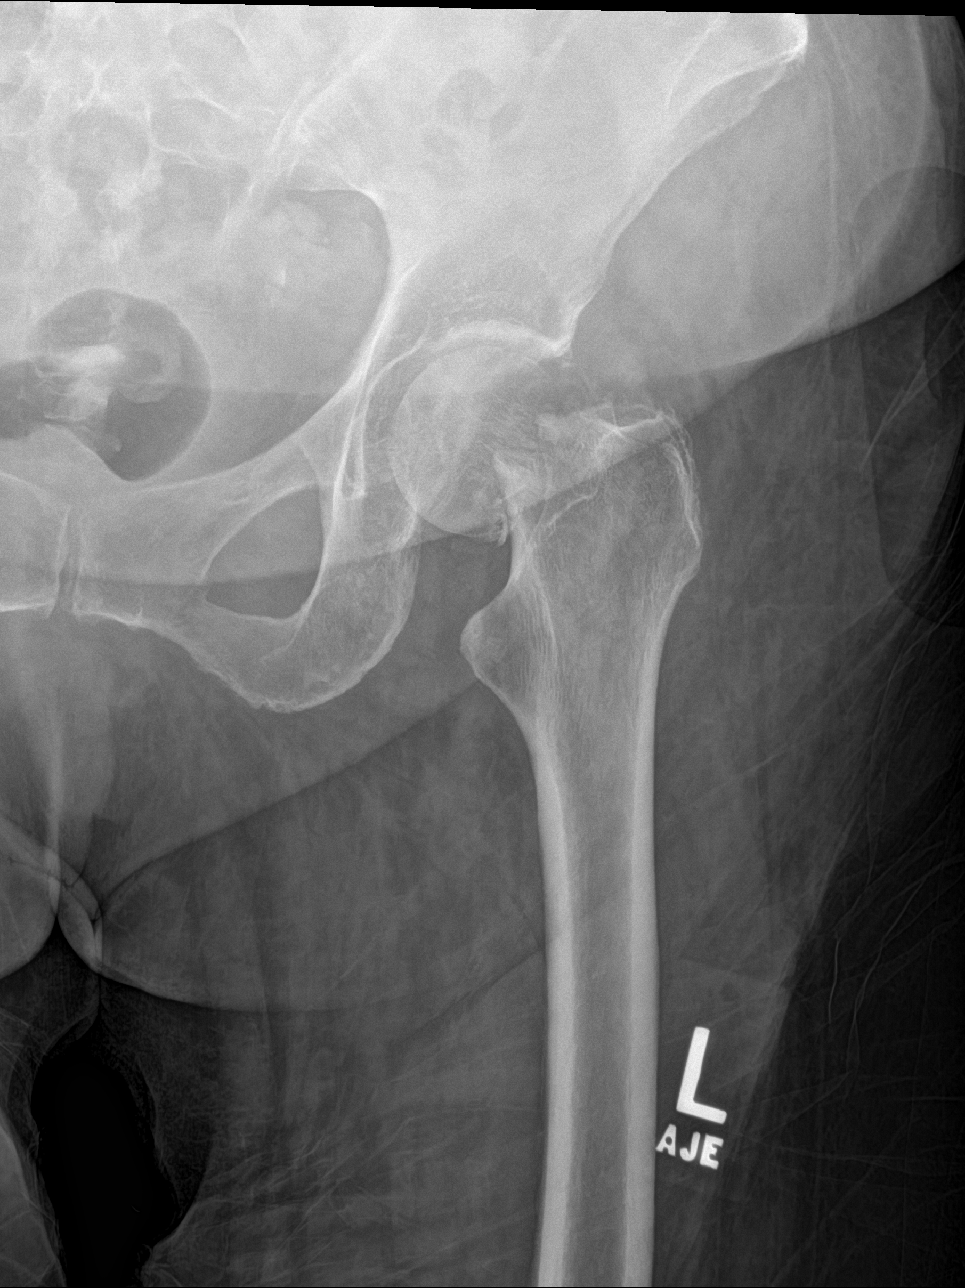

[4 of 4 positions shown; findings below may reference images not displayed]

FINDINGS: There is a fracture of the mid cervical back, with no significant
comminution, but with superior displacement of the distal fracture
component approximate 1.5 cm, as well as mild varus angulation and
mild apex anterior angulation.

No other fractures. Hip joints are normally aligned. Bones are
diffusely demineralized.
IMPRESSION: 1. Left femoral neck fracture, without significant comminution,
mildly displaced and angulated.

## 2016-02-10 IMAGING — CR DG HIP (WITH OR WITHOUT PELVIS) 1V PORT*L*
1 series · 2 of 2 positions shown · non-contrast
Comparison: 01/13/2015

CLINICAL DATA: Status post left hip arthroplasty to treat
subcapital hip fracture.

EXAM:
DG HIP (WITH OR WITHOUT PELVIS) 1V PORT LEFT

[Series 1: ap · 0.17mm/px · 2 of 2 slices shown]
[im 1/2]
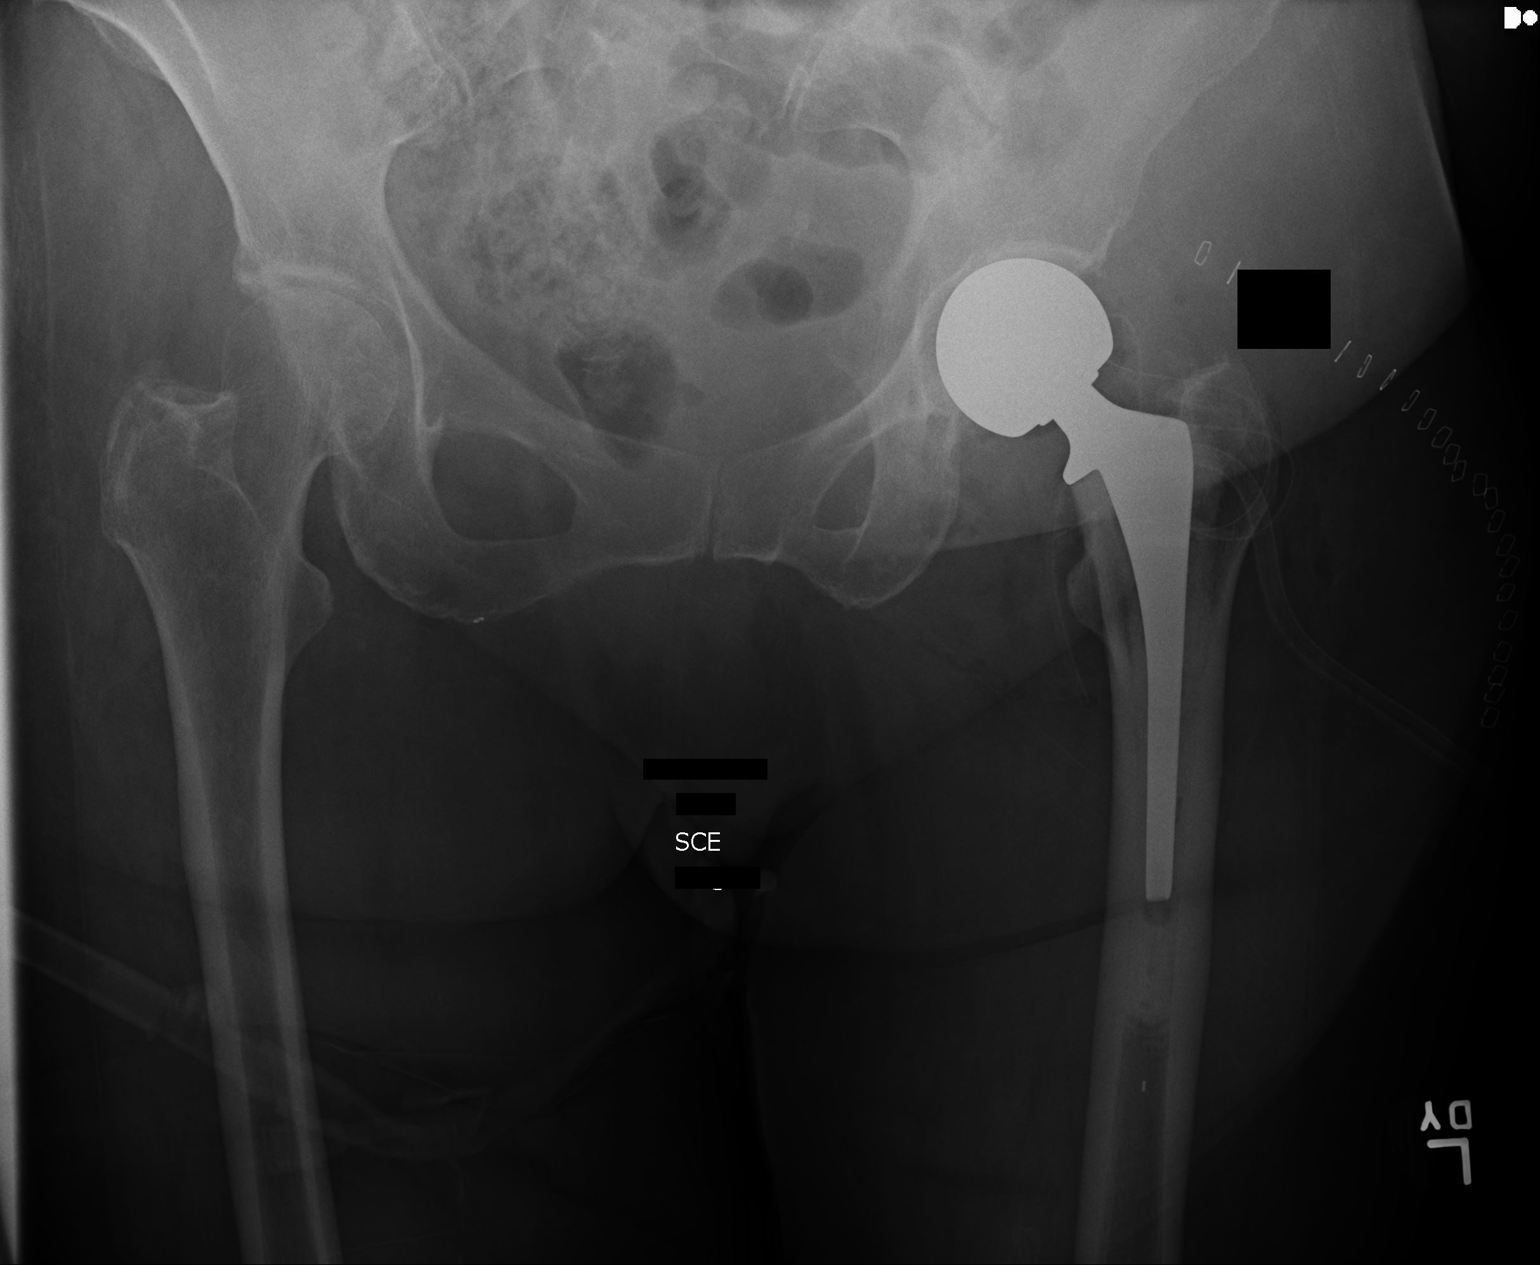
[im 2/2]
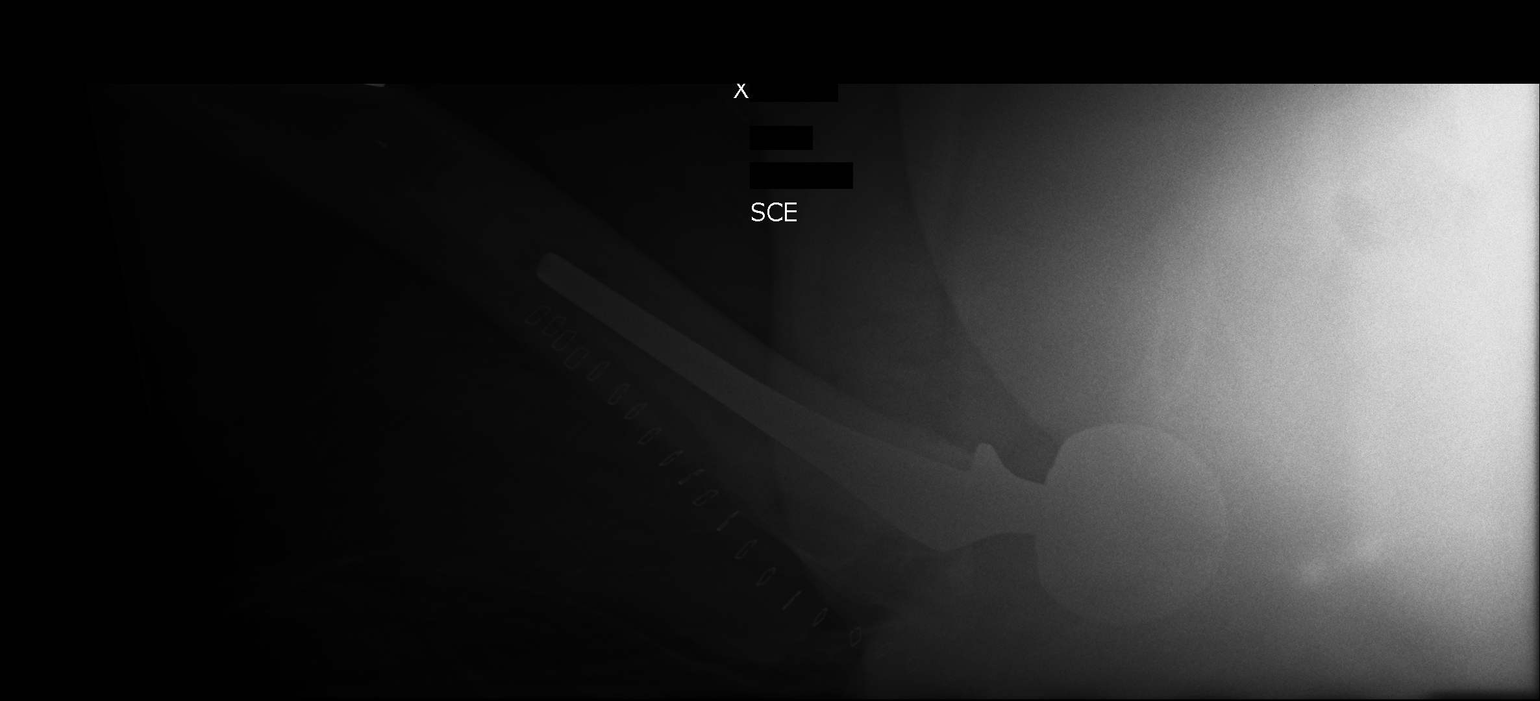

[2 of 2 positions shown; findings below may reference images not displayed]

FINDINGS: Alignment of a bipolar left hip arthroplasty appears normal. No
evidence of fracture or abnormal lucency surrounding hardware.
IMPRESSION: Normal alignment following left hip arthroplasty

## 2016-02-23 ENCOUNTER — Other Ambulatory Visit: Payer: Self-pay | Admitting: Internal Medicine

## 2016-03-05 ENCOUNTER — Encounter: Payer: Self-pay | Admitting: Cardiovascular Disease

## 2016-03-05 ENCOUNTER — Ambulatory Visit (INDEPENDENT_AMBULATORY_CARE_PROVIDER_SITE_OTHER): Payer: Commercial Managed Care - HMO | Admitting: Cardiovascular Disease

## 2016-03-05 VITALS — BP 138/64 | HR 63 | Ht 62.0 in | Wt 168.1 lb

## 2016-03-05 DIAGNOSIS — I2510 Atherosclerotic heart disease of native coronary artery without angina pectoris: Secondary | ICD-10-CM

## 2016-03-05 DIAGNOSIS — E7800 Pure hypercholesterolemia, unspecified: Secondary | ICD-10-CM

## 2016-03-05 DIAGNOSIS — I10 Essential (primary) hypertension: Secondary | ICD-10-CM | POA: Diagnosis not present

## 2016-03-05 NOTE — Progress Notes (Signed)
Cardiology Office Note   Date:  03/05/2016   ID:  [name], DOB 25-Mar-1926, MRN [id number]  PCP:  [name], MD  Cardiologist:   [name], MD   Chief Complaint  Patient presents with  . other    6 month f/u. Meds reviewed verbally with pt.      History of Present Illness: Jenna Marshall is a 82 y.o. female who presents for a follow up visit regarding CAD. She is s/p 4 vessel CABG on 10/14/2014 for severe LM stenosis, diastolic dysfunction, hypertension, and hyperlipidemia . She had a left hip fracture in August, 2016 after a mechanical fall which required surgery.  She has been doing well and denies any chest pain, Shortness of breath, palpitations or dizziness. She walks with a walker and occasionally a cane. She has not had any recurrent falls.   Past Medical History:  Diagnosis Date  . CAD (coronary artery disease)    a. Lexiscan 10/13/14: mid anterior to apical & inf wall ischemia w/ WMA, mild to mod dep EF; b. cardiac cath 10/14/2014: ost LM to LM 50%, LM 99%, ost LAD 95%, ost LAD 95%, prox LAD to mid LAD 40%, prox RCA 30%, mid RCA 70%, Critical left main stenosis. Significant distal RCA stenosis. Heavily calcified arteries. Normal EF by echo with no significant AS or MR. Recommend urgent CABG. cCABG x 4 (5/201  . CAD (coronary artery disease)    a. Lexi 10/13/14: mid ant to apical & inf wall ischemia w/ WMA, mild-mod dep EF; b. cath 10/14/14: ost LM to LM 50%, dLM 99%, ost LAD 95%, ost LAD 95%, pLAD-LAD 40%, pRCA 30%, mRCA 70%, Critical LM stenosis. Sig dRCA stenosis. Heavily calcified arteries; c. CABG x 4 (10/2014)  . Depression   . Diastolic dysfunction    a. echo 10/2014: EF 55-60%, no RWMA, GR1DD, mild AI, trivial MR, mildly dilated LA, PASP normal  . GERD (gastroesophageal reflux disease)   . Hyperlipemia   . Hyperlipidemia   . Hypertension   . Hypothyroidism   . Macular degeneration   . pernicious anemia   . Varicose veins     Past Surgical  History:  Procedure Laterality Date  . CARDIAC CATHETERIZATION Left 10/14/2014   Procedure: Left Heart Cath and Coronary Angiography;  Surgeon: Wellington Hampshire, MD;  Location: [facility] [place] [facility];  Service: Cardiovascular;  Laterality: Left;  . CORONARY ARTERY BYPASS GRAFT N/A 10/14/2014   Procedure: CORONARY ARTERY BYPASS GRAFTING (CABG), ON PUMP, TIMES FOUR, USING LEFT INTERNAL MAMMARY ARTERY, RIGHT GREATER SAPHENOUS VEIN HARVESTED ENDOSCOPICALLY;  Surgeon: Gaye Pollack, MD;  Location: [place];  Service: Open Heart Surgery;  Laterality: N/A;  . CORONARY ARTERY BYPASS GRAFT  10/2014   LIMA-->LAD, SVG-->RCA, sequential SVG-->Ramus and OM  . HEMORRHOID SURGERY    . HIP ARTHROPLASTY Left 01/14/2015   Procedure: ARTHROPLASTY BIPOLAR HIP (HEMIARTHROPLASTY);  Surgeon: Dereck Leep, MD;  Location: [facility];  Service: Orthopedics;  Laterality: Left;  Marland Kitchen VARICOSE VEIN SURGERY       Current Outpatient Prescriptions  Medication Sig Dispense Refill  . acetaminophen (TYLENOL) 325 MG tablet Take 2 tablets (650 mg total) by mouth every 6 (six) hours as needed for mild pain. (Patient taking differently: Take 325-650 mg by mouth every 6 (six) hours as needed for mild pain. )    . aspirin 81 MG chewable tablet Chew 1 tablet (81 mg total) by mouth daily. 30 tablet 0  . atorvastatin (LIPITOR) 40  MG tablet Take 1 tablet (40 mg total) by mouth daily. 30 tablet 6  . Calcium Carbonate-Vitamin D (CALCIUM + D) 600-200 MG-UNIT TABS Take 1 tablet by mouth 2 (two) times daily.     . carvedilol (COREG) 3.125 MG tablet TAKE ONE TABLET BY MOUTH TWICE DAILY WITH MEALS 60 tablet 3  . Cholecalciferol (D3 DOTS) 2000 UNITS TBDP Take 2,000 Units by mouth.    . levothyroxine (SYNTHROID, LEVOTHROID) 25 MCG tablet TAKE ONE TABLET BY MOUTH EVERY MORNING BEFORE BREAKFAST FOR THYROID 30 tablet 5  . meloxicam (MOBIC) 15 MG tablet Take 1 tablet (15 mg total) by mouth daily as needed. 30 tablet 5  . Multiple Vitamin (MULTIVITAMIN WITH  MINERALS) TABS tablet Take 1 tablet by mouth daily.    . Multiple Vitamins-Minerals (PRESERVISION AREDS 2) CAPS Take 1 capsule by mouth 2 (two) times daily.     . pantoprazole (PROTONIX) 40 MG tablet TAKE 1 TABLET BY MOUTH EACH DAY 30 tablet 5  . senna-docusate (SENOKOT-S) 8.6-50 MG per tablet Take 1 tablet by mouth 2 (two) times daily. 30 tablet 0  . torsemide (DEMADEX) 10 MG tablet As needed for overnight weight gain of 2 lbs 30 tablet 3  . torsemide (DEMADEX) 10 MG tablet Take 1 tablet (10 mg total) by mouth daily. As needed for weight gain over 2 pounds. 30 tablet 3   No current facility-administered medications for this visit.     Allergies:   Ambien [zolpidem]; Codeine; and Lasix [furosemide]    Social History:  The patient  reports that she has never smoked. She has never used smokeless tobacco. She reports that she does not drink alcohol or use drugs.   Family History:  The patient's family history includes Coronary artery disease in her brother and mother; Diabetes in her mother; Heart disease in her father.    ROS:  Please see the history of present illness.   Otherwise, review of systems are positive for none.   All other systems are reviewed and negative.    PHYSICAL EXAM: VS:  BP 138/64 (BP Location: Left Arm, Patient Position: Sitting, Cuff Size: Normal)   Pulse 63   Ht 5\' 2"  (1.575 m)   Wt 168 lb 1 oz (76.2 kg)   BMI 30.74 kg/m  , BMI Body mass index is 30.74 kg/m. GEN: Well nourished, well developed, in no acute distress  HEENT: normal  Neck: no JVD, carotid bruits, or masses Cardiac: RRR; no  rubs, or gallops,no edema . 2/systolic ejection murmur in the aortic area which is early peaking. Respiratory:  clear to auscultation bilaterally, normal work of breathing GI: soft, nontender, nondistended, + BS MS: no deformity or atrophy  Skin: warm and dry, no rash Neuro:  Strength and sensation are intact Psych: euthymic mood, full affect   EKG:  EKG is ordered  today. The ekg ordered today demonstrates normal sinus rhythm with PACs   Recent Labs: 11/01/2015: ALT 10; BUN 20; Creatinine, Ser 0.87; Hemoglobin 11.7; Platelets 158.0; Potassium 4.5; Sodium 138; TSH 2.61    Lipid Panel    Component Value Date/Time   CHOL 139 11/01/2015 0950   CHOL 114 12/23/2014 0826   TRIG 95.0 11/01/2015 0950   HDL 44.40 11/01/2015 0950   HDL 40 12/23/2014 0826   CHOLHDL 3 11/01/2015 0950   VLDL 19.0 11/01/2015 0950   LDLCALC 76 11/01/2015 0950   LDLCALC 56 12/23/2014 0826   LDLDIRECT 104.4 02/27/2011 1135      Wt Readings from  Last 3 Encounters:  03/05/16 168 lb 1 oz (76.2 kg)  11/01/15 168 lb 4 oz (76.3 kg)  09/22/15 157 lb 8 oz (71.4 kg)        ASSESSMENT AND PLAN:  1.  Coronary artery disease involving native coronary arteries without angina: She is doing extremely well with no anginal symptoms. EKG is unremarkable. Continue medical therapy.  2. Essential hypertension: Blood pressure is under excellent control on current medications.  3. Hyperlipidemia: Continue treatment with atorvastatin. I reviewed most recent labs which showed an LDL of 76.  4. Cardiac murmur suggestive of aortic sclerosis or mild stenosis: I will consider an echocardiogram next year.  5. PACs: She is completely asymptomatic.   Disposition:   FU with me in 6 months  Signed,  [name], MD  03/05/2016 1:52 PM    [facility]

## 2016-03-05 NOTE — Patient Instructions (Signed)
Medication Instructions: Continue same medications.   Labwork: None.   Procedures/Testing: None.   Follow-Up: 6 months with Dr. Arida.   Any Additional Special Instructions Will Be Listed Below (If Applicable).     If you need a refill on your cardiac medications before your next appointment, please call your pharmacy.   

## 2016-03-07 ENCOUNTER — Ambulatory Visit: Payer: Self-pay | Admitting: Cardiovascular Disease

## 2016-04-05 ENCOUNTER — Other Ambulatory Visit: Payer: Self-pay | Admitting: Internal Medicine

## 2016-04-17 DIAGNOSIS — H2513 Age-related nuclear cataract, bilateral: Secondary | ICD-10-CM | POA: Diagnosis not present

## 2016-04-22 ENCOUNTER — Encounter: Payer: Self-pay | Admitting: *Deleted

## 2016-04-23 NOTE — Discharge Instructions (Signed)
Cataract Surgery, Care After °Refer to this sheet in the next few weeks. These instructions provide you with information about caring for yourself after your procedure. Your health care provider may also give you more specific instructions. Your treatment has been planned according to current medical practices, but problems sometimes occur. Call your health care provider if you have any problems or questions after your procedure. °What can I expect after the procedure? °After the procedure, it is common to have: °· Itching. °· Discomfort. °· Fluid discharge. °· Sensitivity to light and to touch. °· Bruising. °Follow these instructions at home: °Eye Care  °· Check your eye every day for signs of infection. Watch for: °¨ Redness, swelling, or pain. °¨ Fluid, blood, or pus. °¨ Warmth. °¨ Bad smell. °Activity  °· Avoid strenuous activities, such as playing contact sports, for as long as told by your health care provider. °· Do not drive or operate heavy machinery until your health care provider approves. °· Do not bend or lift heavy objects . Bending increases pressure in the eye. You can walk, climb stairs, and do light household chores. °· Ask your health care provider when you can return to work. If you work in a dusty environment, you may be advised to wear protective eyewear for a period of time. °General instructions  °· Take or apply over-the-counter and prescription medicines only as told by your health care provider. This includes eye drops. °· Do not touch or rub your eyes. °· If you were given a protective shield, wear it as told by your health care provider. If you were not given a protective shield, wear sunglasses as told by your health care provider to protect your eyes. °· Keep the area around your eye clean and dry. Avoid swimming or allowing water to hit you directly in the face while showering until told by your health care provider. Keep soap and shampoo out of your eyes. °· Do not put a contact lens  into the affected eye or eyes until your health care provider approves. °· Keep all follow-up visits as told by your health care provider. This is important. °Contact a health care provider if: ° °· You have increased bruising around your eye. °· You have pain that is not helped with medicine. °· You have a fever. °· You have redness, swelling, or pain in your eye. °· You have fluid, blood, or pus coming from your incision. °· Your vision gets worse. °Get help right away if: °· You have sudden vision loss. °This information is not intended to replace advice given to you by your health care provider. Make sure you discuss any questions you have with your health care provider. °Document Released: 12/07/2004 Document Revised: 09/28/2015 Document Reviewed: 03/30/2015 °Elsevier Interactive Patient Education © 2017 [employer] ° ° ° ° °General Anesthesia, Adult, Care After °These instructions provide you with information about caring for yourself after your procedure. Your health care provider may also give you more specific instructions. Your treatment has been planned according to current medical practices, but problems sometimes occur. Call your health care provider if you have any problems or questions after your procedure. °What can I expect after the procedure? °After the procedure, it is common to have: °· Vomiting. °· A sore throat. °· Mental slowness. °It is common to feel: °· Nauseous. °· Cold or shivery. °· Sleepy. °· Tired. °· Sore or achy, even in parts of your body where you did not have surgery. °Follow these instructions at   home: °For at least 24 hours after the procedure:  °· Do not: °¨ Participate in activities where you could fall or become injured. °¨ Drive. °¨ Use heavy machinery. °¨ Drink alcohol. °¨ Take sleeping pills or medicines that cause drowsiness. °¨ Make important decisions or sign legal documents. °¨ Take care of children on your own. °· Rest. °Eating and drinking  °· If you vomit, drink  water, juice, or soup when you can drink without vomiting. °· Drink enough fluid to keep your urine clear or pale yellow. °· Make sure you have little or no nausea before eating solid foods. °· Follow the diet recommended by your health care provider. °General instructions  °· Have a responsible adult stay with you until you are awake and alert. °· Return to your normal activities as told by your health care provider. Ask your health care provider what activities are safe for you. °· Take over-the-counter and prescription medicines only as told by your health care provider. °· If you smoke, do not smoke without supervision. °· Keep all follow-up visits as told by your health care provider. This is important. °Contact a health care provider if: °· You continue to have nausea or vomiting at home, and medicines are not helpful. °· You cannot drink fluids or start eating again. °· You cannot urinate after 8-12 hours. °· You develop a skin rash. °· You have fever. °· You have increasing redness at the site of your procedure. °Get help right away if: °· You have difficulty breathing. °· You have chest pain. °· You have unexpected bleeding. °· You feel that you are having a life-threatening or urgent problem. °This information is not intended to replace advice given to you by your health care provider. Make sure you discuss any questions you have with your health care provider. °Document Released: 08/26/2000 Document Revised: 10/23/2015 Document Reviewed: 05/04/2015 °Elsevier Interactive Patient Education © 2017 [employer] ° °

## 2016-04-30 ENCOUNTER — Ambulatory Visit: Payer: Commercial Managed Care - HMO | Admitting: Anesthesiology

## 2016-04-30 ENCOUNTER — Encounter
Admission: RE | Disposition: A | Discharge status: Home / Self Care | Payer: Self-pay | Source: Ambulatory Visit | Attending: Ophthalmology

## 2016-04-30 ENCOUNTER — Ambulatory Visit
Admission: RE | Admit: 2016-04-30 | Discharge: 2016-04-30 | Disposition: A | Discharge status: Home / Self Care | Payer: Commercial Managed Care - HMO | Source: Ambulatory Visit | Attending: Ophthalmology | Admitting: Ophthalmology

## 2016-04-30 DIAGNOSIS — I10 Essential (primary) hypertension: Secondary | ICD-10-CM | POA: Diagnosis not present

## 2016-04-30 DIAGNOSIS — H2512 Age-related nuclear cataract, left eye: Secondary | ICD-10-CM | POA: Insufficient documentation

## 2016-04-30 DIAGNOSIS — D649 Anemia, unspecified: Secondary | ICD-10-CM | POA: Diagnosis not present

## 2016-04-30 DIAGNOSIS — E039 Hypothyroidism, unspecified: Secondary | ICD-10-CM | POA: Insufficient documentation

## 2016-04-30 DIAGNOSIS — K219 Gastro-esophageal reflux disease without esophagitis: Secondary | ICD-10-CM | POA: Insufficient documentation

## 2016-04-30 DIAGNOSIS — I2510 Atherosclerotic heart disease of native coronary artery without angina pectoris: Secondary | ICD-10-CM | POA: Insufficient documentation

## 2016-04-30 DIAGNOSIS — H2513 Age-related nuclear cataract, bilateral: Secondary | ICD-10-CM | POA: Diagnosis not present

## 2016-04-30 DIAGNOSIS — F329 Major depressive disorder, single episode, unspecified: Secondary | ICD-10-CM | POA: Diagnosis not present

## 2016-04-30 HISTORY — DX: Presence of dental prosthetic device (complete) (partial): Z97.2

## 2016-04-30 HISTORY — DX: Presence of external hearing-aid: Z97.4

## 2016-04-30 HISTORY — PX: CATARACT EXTRACTION W/PHACO: SHX586

## 2016-04-30 HISTORY — DX: Unspecified osteoarthritis, unspecified site: M19.90

## 2016-04-30 SURGERY — PHACOEMULSIFICATION, CATARACT, WITH IOL INSERTION
Anesthesia: Monitor Anesthesia Care | Laterality: Left | Wound class: Clean

## 2016-04-30 MED ORDER — ARMC OPHTHALMIC DILATING DROPS
1.0000 "application " | OPHTHALMIC | Status: DC | PRN
  Administered 2016-04-30 (×3): 1 via OPHTHALMIC

## 2016-04-30 MED ORDER — LIDOCAINE HCL (PF) 4 % IJ SOLN
INTRAOCULAR | Status: DC | PRN
  Administered 2016-04-30: 1 mL via OPHTHALMIC

## 2016-04-30 MED ORDER — SODIUM HYALURONATE 10 MG/ML IO SOLN
INTRAOCULAR | Status: DC | PRN
  Administered 2016-04-30: 0.85 mL via INTRAOCULAR

## 2016-04-30 MED ORDER — FENTANYL CITRATE (PF) 100 MCG/2ML IJ SOLN
INTRAMUSCULAR | Status: DC | PRN
  Administered 2016-04-30: 50 ug via INTRAVENOUS

## 2016-04-30 MED ORDER — MIDAZOLAM HCL 2 MG/2ML IJ SOLN
INTRAMUSCULAR | Status: DC | PRN
  Administered 2016-04-30: 2 mg via INTRAVENOUS

## 2016-04-30 MED ORDER — SODIUM HYALURONATE 23 MG/ML IO SOLN
INTRAOCULAR | Status: DC | PRN
  Administered 2016-04-30: 0.6 mL via INTRAOCULAR

## 2016-04-30 MED ORDER — MOXIFLOXACIN HCL 0.5 % OP SOLN
OPHTHALMIC | Status: DC | PRN
  Administered 2016-04-30: 1 [drp] via OPHTHALMIC

## 2016-04-30 MED ORDER — EPINEPHRINE PF 1 MG/ML IJ SOLN
INTRAOCULAR | Status: DC | PRN
  Administered 2016-04-30: 83 mL via OPHTHALMIC

## 2016-04-30 SURGICAL SUPPLY — 19 items
CANNULA ANT/CHMB 27GA (MISCELLANEOUS) ×3 IMPLANT
CUP MEDICINE 2OZ PLAST GRAD ST (MISCELLANEOUS) ×3 IMPLANT
DISSECTOR HYDRO NUCLEUS 50X22 (MISCELLANEOUS) ×3 IMPLANT
GLOVE BIO SURGEON STRL SZ8 (GLOVE) ×3 IMPLANT
GLOVE SURG LX 7.5 STRW (GLOVE) ×2
GLOVE SURG LX STRL 7.5 STRW (GLOVE) ×1 IMPLANT
GOWN STRL REUS W/ TWL LRG LVL3 (GOWN DISPOSABLE) ×2 IMPLANT
GOWN STRL REUS W/TWL LRG LVL3 (GOWN DISPOSABLE) ×4
LENS IOL TECNIS ITEC 22.0 (Intraocular Lens) ×3 IMPLANT
MARKER SKIN DUAL TIP RULER LAB (MISCELLANEOUS) ×3 IMPLANT
PACK CATARACT (MISCELLANEOUS) ×3 IMPLANT
PACK CATARACT BRASINGTON (MISCELLANEOUS) ×3 IMPLANT
PACK EYE AFTER SURG (MISCELLANEOUS) ×3 IMPLANT
SOL PREP PVP 2OZ (MISCELLANEOUS) ×3
SOLUTION PREP PVP 2OZ (MISCELLANEOUS) ×1 IMPLANT
SYR 3ML LL SCALE MARK (SYRINGE) ×3 IMPLANT
SYR TB 1ML LUER SLIP (SYRINGE) ×3 IMPLANT
WATER STERILE IRR 250ML POUR (IV SOLUTION) ×3 IMPLANT
WIPE NON LINTING 3.25X3.25 (MISCELLANEOUS) ×3 IMPLANT

## 2016-04-30 NOTE — Anesthesia Postprocedure Evaluation (Signed)
Anesthesia Post Note  Patient: Jenna Marshall  Procedure(s) Performed: Procedure(s) (LRB): CATARACT EXTRACTION PHACO AND INTRAOCULAR LENS PLACEMENT (IOC) (Left)  Patient location during evaluation: PACU Anesthesia Type: MAC Level of consciousness: awake and alert Pain management: pain level controlled Vital Signs Assessment: post-procedure vital signs reviewed and stable Respiratory status: spontaneous breathing, nonlabored ventilation, respiratory function stable and patient connected to nasal cannula oxygen Cardiovascular status: stable and blood pressure returned to baseline Anesthetic complications: no    Alisa Graff

## 2016-04-30 NOTE — Anesthesia Preprocedure Evaluation (Signed)
Anesthesia Evaluation  Patient identified by MRN, date of birth, ID band Patient awake    Reviewed: Allergy & Precautions, H&P , NPO status , Patient's Chart, lab work & pertinent test results, reviewed documented beta blocker date and time   Airway Mallampati: II  TM Distance: >3 FB Neck ROM: full    Dental  (+) Partial Upper   Pulmonary neg pulmonary ROS,    Pulmonary exam normal breath sounds clear to auscultation       Cardiovascular Exercise Tolerance: Good hypertension, + CAD and + CABG   Rhythm:regular Rate:Normal     Neuro/Psych PSYCHIATRIC DISORDERS (depression) negative neurological ROS     GI/Hepatic Neg liver ROS, GERD  ,  Endo/Other  Hypothyroidism   Renal/GU negative Renal ROS  negative genitourinary   Musculoskeletal   Abdominal   Peds  Hematology  (+) anemia ,   Anesthesia Other Findings   Reproductive/Obstetrics negative OB ROS                             Anesthesia Physical Anesthesia Plan  ASA: III  Anesthesia Plan: MAC   Post-op Pain Management:    Induction:   Airway Management Planned:   Additional Equipment:   Intra-op Plan:   Post-operative Plan:   Informed Consent: I have reviewed the patients History and Physical, chart, labs and discussed the procedure including the risks, benefits and alternatives for the proposed anesthesia with the patient or authorized representative who has indicated his/her understanding and acceptance.   Dental Advisory Given  Plan Discussed with: CRNA  Anesthesia Plan Comments:         Anesthesia Quick Evaluation

## 2016-04-30 NOTE — Transfer of Care (Addendum)
Immediate Anesthesia Transfer of Care Note  Patient: Jenna Marshall  Procedure(s) Performed: Procedure(s) with comments: CATARACT EXTRACTION PHACO AND INTRAOCULAR LENS PLACEMENT (IOC) (Left) - LEFT  Patient Location: PACU  Anesthesia Type: MAC  Level of Consciousness: awake, alert  and patient cooperative  Airway and Oxygen Therapy: Patient Spontanous Breathing and Patient connected to supplemental oxygen  Post-op Assessment: Post-op Vital signs reviewed, Patient's Cardiovascular Status Stable, Respiratory Function Stable, Patent Airway and No signs of Nausea or vomiting  Post-op Vital Signs: Reviewed and stable  Complications: No apparent anesthesia complications

## 2016-04-30 NOTE — Anesthesia Procedure Notes (Deleted)
Performed by: Mackey Varricchio Pre-anesthesia Checklist: Patient identified, Emergency Drugs available, Suction available, Timeout performed and Patient being monitored Patient Re-evaluated:Patient Re-evaluated prior to inductionOxygen Delivery Method: Circle system utilized Preoxygenation: Pre-oxygenation with 100% oxygen Intubation Type: Inhalational induction Ventilation: Mask ventilation without difficulty and Mask ventilation throughout procedure Dental Injury: Teeth and Oropharynx as per pre-operative assessment        

## 2016-04-30 NOTE — Transfer of Care (Deleted)
Immediate Anesthesia Transfer of Care Note  Patient: Jenna Marshall  Procedure(s) Performed: Procedure(s) with comments: CATARACT EXTRACTION PHACO AND INTRAOCULAR LENS PLACEMENT (IOC) (Left) - LEFT  Patient Location: PACU  Anesthesia Type: MAC  Level of Consciousness: awake, alert  and patient cooperative  Airway and Oxygen Therapy: Patient Spontanous Breathing and Patient connected to supplemental oxygen  Post-op Assessment: Post-op Vital signs reviewed, Patient's Cardiovascular Status Stable, Respiratory Function Stable, Patent Airway and No signs of Nausea or vomiting  Post-op Vital Signs: Reviewed and stable  Complications: No apparent anesthesia complications

## 2016-04-30 NOTE — Op Note (Signed)
OPERATIVE NOTE  Jenna Marshall [id number] 04/30/2016   PREOPERATIVE DIAGNOSIS:  Nuclear sclerotic cataract left eye.  H25.12   POSTOPERATIVE DIAGNOSIS:    Nuclear sclerotic cataract left eye.     PROCEDURE:  Phacoemusification with posterior chamber intraocular lens placement of the left eye   LENS:   Implant Name Type Inv. Item Serial No. Manufacturer Lot No. LRB No. Used  LENS IOL DIOP 22.0 - [id number] Intraocular Lens LENS IOL DIOP 22.0 [id number] AMO   Left 1       PCB00 +22.0   ULTRASOUND TIME: 0 minutes 37 seconds.  CDE 5.80   SURGEON:  Benay Pillow, MD, MPH   ANESTHESIA:  Topical with tetracaine drops augmented with 1% preservative-free intracameral lidocaine.   COMPLICATIONS:  None.   DESCRIPTION OF PROCEDURE:  The patient was identified in the holding room and transported to the operating room and placed in the supine position under the operating microscope.  The left eye was identified as the operative eye and it was prepped and draped in the usual sterile ophthalmic fashion.   A 1.0 millimeter clear-corneal paracentesis was made at the 5:00 position. 0.5 ml of preservative-free 1% lidocaine with epinephrine was injected into the anterior chamber.  The anterior chamber was filled with Healon 5 viscoelastic.  A 2.4 millimeter keratome was used to make a near-clear corneal incision at the 2:00 position.  A curvilinear capsulorrhexis was made with a cystotome and capsulorrhexis forceps.  Balanced salt solution was used to hydrodissect and hydrodelineate the nucleus.   Phacoemulsification was then used in stop and chop fashion to remove the lens nucleus and epinucleus.  The remaining cortex was then removed using the irrigation and aspiration handpiece. Healon was then placed into the capsular bag to distend it for lens placement.  A lens was then injected into the capsular bag.  The remaining viscoelastic was aspirated.   Wounds were hydrated with balanced salt solution.   The anterior chamber was inflated to a physiologic pressure with balanced salt solution.   Intracameral vigamox 0.1 mL undiltued was injected into the eye and a drop placed onto the ocular surface.   No wound leaks were noted.  The patient was taken to the recovery room in stable condition without complications of anesthesia or surgery  Good routine case without complication.  Benay Pillow 04/30/2016, 12:03 PM

## 2016-04-30 NOTE — Anesthesia Procedure Notes (Signed)
Procedure Name: MAC Performed by: Andrina Locken Pre-anesthesia Checklist: Patient identified, Emergency Drugs available, Suction available, Timeout performed and Patient being monitored Patient Re-evaluated:Patient Re-evaluated prior to inductionOxygen Delivery Method: Nasal cannula Placement Confirmation: positive ETCO2     

## 2016-04-30 NOTE — H&P (Signed)
The History and Physical notes are on paper, have been signed, and are to be scanned. The patient remains stable and unchanged from the H&P.   Previous H&P reviewed, patient examined, and there are no changes.  Jenna Marshall 04/30/2016 10:03 AM

## 2016-05-01 ENCOUNTER — Encounter: Payer: Self-pay | Admitting: Ophthalmology

## 2016-05-08 ENCOUNTER — Ambulatory Visit (INDEPENDENT_AMBULATORY_CARE_PROVIDER_SITE_OTHER): Payer: Commercial Managed Care - HMO | Admitting: Internal Medicine

## 2016-05-08 ENCOUNTER — Encounter: Payer: Self-pay | Admitting: Internal Medicine

## 2016-05-08 VITALS — BP 132/66 | HR 57 | Temp 97.9°F | Resp 12 | Ht 61.0 in | Wt 170.5 lb

## 2016-05-08 DIAGNOSIS — D492 Neoplasm of unspecified behavior of bone, soft tissue, and skin: Secondary | ICD-10-CM

## 2016-05-08 DIAGNOSIS — I10 Essential (primary) hypertension: Secondary | ICD-10-CM | POA: Diagnosis not present

## 2016-05-08 DIAGNOSIS — R351 Nocturia: Secondary | ICD-10-CM

## 2016-05-08 DIAGNOSIS — Z1239 Encounter for other screening for malignant neoplasm of breast: Secondary | ICD-10-CM

## 2016-05-08 DIAGNOSIS — D508 Other iron deficiency anemias: Secondary | ICD-10-CM

## 2016-05-08 DIAGNOSIS — Z0000 Encounter for general adult medical examination without abnormal findings: Secondary | ICD-10-CM | POA: Diagnosis not present

## 2016-05-08 DIAGNOSIS — E559 Vitamin D deficiency, unspecified: Secondary | ICD-10-CM | POA: Diagnosis not present

## 2016-05-08 DIAGNOSIS — Z79899 Other long term (current) drug therapy: Secondary | ICD-10-CM | POA: Diagnosis not present

## 2016-05-08 DIAGNOSIS — E034 Atrophy of thyroid (acquired): Secondary | ICD-10-CM

## 2016-05-08 DIAGNOSIS — Z1231 Encounter for screening mammogram for malignant neoplasm of breast: Secondary | ICD-10-CM

## 2016-05-08 DIAGNOSIS — E7800 Pure hypercholesterolemia, unspecified: Secondary | ICD-10-CM

## 2016-05-08 LAB — TSH: TSH: 4.03 u[IU]/mL (ref 0.35–4.50)

## 2016-05-08 LAB — CBC WITH DIFFERENTIAL/PLATELET
BASOS PCT: 1 % (ref 0.0–3.0)
Basophils Absolute: 0.1 10*3/uL (ref 0.0–0.1)
EOS ABS: 0.2 10*3/uL (ref 0.0–0.7)
Eosinophils Relative: 2.1 % (ref 0.0–5.0)
HCT: 31 % — ABNORMAL LOW (ref 36.0–46.0)
HEMOGLOBIN: 10.2 g/dL — AB (ref 12.0–15.0)
LYMPHS ABS: 1.8 10*3/uL (ref 0.7–4.0)
Lymphocytes Relative: 23.6 % (ref 12.0–46.0)
MCHC: 33 g/dL (ref 30.0–36.0)
MCV: 87.2 fl (ref 78.0–100.0)
MONO ABS: 0.7 10*3/uL (ref 0.1–1.0)
Monocytes Relative: 8.8 % (ref 3.0–12.0)
NEUTROS PCT: 64.5 % (ref 43.0–77.0)
Neutro Abs: 5 10*3/uL (ref 1.4–7.7)
Platelets: 178 10*3/uL (ref 150.0–400.0)
RBC: 3.55 Mil/uL — ABNORMAL LOW (ref 3.87–5.11)
RDW: 16.1 % — AB (ref 11.5–15.5)
WBC: 7.8 10*3/uL (ref 4.0–10.5)

## 2016-05-08 LAB — COMPREHENSIVE METABOLIC PANEL
ALBUMIN: 4.1 g/dL (ref 3.5–5.2)
ALT: 9 U/L (ref 0–35)
AST: 16 U/L (ref 0–37)
Alkaline Phosphatase: 58 U/L (ref 39–117)
BILIRUBIN TOTAL: 0.5 mg/dL (ref 0.2–1.2)
BUN: 25 mg/dL — AB (ref 6–23)
CALCIUM: 9.2 mg/dL (ref 8.4–10.5)
CO2: 27 meq/L (ref 19–32)
CREATININE: 0.94 mg/dL (ref 0.40–1.20)
Chloride: 102 mEq/L (ref 96–112)
GFR: 59.68 mL/min — ABNORMAL LOW (ref >60.00)
Glucose, Bld: 96 mg/dL (ref 70–99)
Potassium: 4.6 mEq/L (ref 3.5–5.1)
SODIUM: 136 meq/L (ref 135–145)
Total Protein: 6.7 g/dL (ref 6.0–8.3)

## 2016-05-08 LAB — VITAMIN D 25 HYDROXY (VIT D DEFICIENCY, FRACTURES): VITD: 52.44 ng/mL (ref 30.00–100.00)

## 2016-05-08 NOTE — Progress Notes (Signed)
Pre-visit discussion using our clinic review tool. No additional management support is needed unless otherwise documented below in the visit note.  

## 2016-05-08 NOTE — Patient Instructions (Signed)

## 2016-05-08 NOTE — Progress Notes (Addendum)
Patient ID: Jenna Marshall, female    DOB: 1926-02-21  Age: 82 y.o. MRN: [id number]  The patient is here for annual preventive examination and management of other chronic and acute problems.   Mammogram is due in march   The risk factors are reflected in the social history.  The roster of all physicians providing medical care to patient - is listed in the Snapshot section of the chart.  Activities of daily living:  The patient is 100% independent in all ADLs: dressing, toileting, feeding as well as independent mobility.  She uses a walker .   Home safety : The patient has smoke detectors in the home. They wear seatbelts.  There are no firearms at home. There is no violence in the home.   There is no risks for hepatitis, STDs or HIV. There is no   history of blood transfusion. They have no travel history to infectious disease endemic areas of the world.  The patient has seen their dentist in the last six month. They have seen their eye doctor in the last year. They admit to slight hearing difficulty with regard to whispered voices and some television programs.  They have deferred audiologic testing in the last year.  They do not  have excessive sun exposure. Discussed the need for sun protection: hats, long sleeves and use of sunscreen if there is significant sun exposure.   Diet: the importance of a healthy diet is discussed. They do have a healthy diet.  The benefits of regular aerobic exercise were discussed. She is not walking outside of the home due to knee pain.  Depression screen: there are no signs or vegative symptoms of depression- irritability, change in appetite, anhedonia, sadness/tearfullness.  Cognitive assessment: the patient manages all their financial and personal affairs and is actively engaged. They could relate day,date,year and events; recalled 2/3 objects at 3 minutes; performed clock-face test normally.  The following portions of the patient's history were reviewed and  updated as appropriate: allergies, current medications, past family history, past medical history,  past surgical history, past social history  and problem list.  Visual acuity was not assessed per patient preference since she has regular follow up with her ophthalmologist. Hearing and body mass index were assessed and reviewed.   During the course of the visit the patient was educated and counseled about appropriate screening and preventive services including : fall prevention , diabetes screening, nutrition counseling, colorectal cancer screening, and recommended immunizations.    CC: The primary encounter diagnosis was Screening for breast cancer. Diagnoses of Essential hypertension, Hypothyroidism due to acquired atrophy of thyroid, Pure hypercholesterolemia, Long-term use of high-risk medication, Other iron deficiency anemia, Vitamin D deficiency, Nocturia more than twice per night, Visit for preventive health examination, and Neoplasm of skin of earlobe were also pertinent to this visit.  She has Gained 13 lbs since April .  Appetite good,  Sedentary life style since her hip fracture and DVT   Nocturia 2-3  Want UA checked. Has history of urge incontinence  Cataract removed recently from left eye  Saw dermatology in July;  left ear "zapped" by Phillip Heal . No recurrence  Sleeping well  S/p Left hip replacment  last year hooten aug 2016/.Using a walker ,  Not using a lift chair.  Venous insufficiency.   Using a new recliner to elevate her  legs Right ear hearing aid. No constipation  Occasional urge incontinence at night  And day    History Jenna Marshall has a  past medical history of Arthritis; CAD (coronary artery disease); CAD (coronary artery disease); Depression; Diastolic dysfunction; GERD (gastroesophageal reflux disease); Hyperlipemia; Hyperlipidemia; Hypertension; Hypothyroidism; Macular degeneration; pernicious anemia; Varicose veins; Wears dentures; and Wears hearing aid.   She has a past  surgical history that includes Cardiac catheterization (Left, 10/14/2014); Coronary artery bypass graft (N/A, 10/14/2014); Coronary artery bypass graft (10/2014); Varicose vein surgery; Hemorrhoid surgery; Hip Arthroplasty (Left, 01/14/2015); and Cataract extraction w/PHACO (Left, 04/30/2016).   Her family history includes Coronary artery disease in her brother and mother; Diabetes in her mother; Heart disease in her father.She reports that she has never smoked. She has never used smokeless tobacco. She reports that she does not drink alcohol or use drugs.  Outpatient Medications Prior to Visit  Medication Sig Dispense Refill  . acetaminophen (TYLENOL) 325 MG tablet Take 2 tablets (650 mg total) by mouth every 6 (six) hours as needed for mild pain. (Patient taking differently: Take 325-650 mg by mouth every 6 (six) hours as needed for mild pain. )    . aspirin 81 MG chewable tablet Chew 1 tablet (81 mg total) by mouth daily. 30 tablet 0  . atorvastatin (LIPITOR) 40 MG tablet Take 1 tablet (40 mg total) by mouth daily. 30 tablet 6  . Calcium Carbonate-Vitamin D (CALCIUM + D) 600-200 MG-UNIT TABS Take 1 tablet by mouth 2 (two) times daily.     . carvedilol (COREG) 3.125 MG tablet TAKE ONE TABLET BY MOUTH TWICE DAILY WITH MEALS 60 tablet 1  . Cholecalciferol (D3 DOTS) 2000 UNITS TBDP Take 2,000 Units by mouth.    . levothyroxine (SYNTHROID, LEVOTHROID) 25 MCG tablet TAKE ONE TABLET BY MOUTH EVERY MORNING BEFORE BREAKFAST FOR THYROID 30 tablet 5  . meloxicam (MOBIC) 15 MG tablet Take 1 tablet (15 mg total) by mouth daily as needed. 30 tablet 5  . Multiple Vitamin (MULTIVITAMIN WITH MINERALS) TABS tablet Take 1 tablet by mouth daily.    . Multiple Vitamins-Minerals (PRESERVISION AREDS 2) CAPS Take 1 capsule by mouth 2 (two) times daily.     . pantoprazole (PROTONIX) 40 MG tablet TAKE 1 TABLET BY MOUTH EACH DAY 30 tablet 5  . senna-docusate (SENOKOT-S) 8.6-50 MG per tablet Take 1 tablet by mouth 2 (two) times  daily. 30 tablet 0  . torsemide (DEMADEX) 10 MG tablet As needed for overnight weight gain of 2 lbs 30 tablet 3  . torsemide (DEMADEX) 10 MG tablet Take 1 tablet (10 mg total) by mouth daily. As needed for weight gain over 2 pounds. 30 tablet 3   No facility-administered medications prior to visit.     Review of Systems   Patient denies headache, fevers, malaise, unintentional weight loss, skin rash, eye pain, sinus congestion and sinus pain, sore throat, dysphagia,  hemoptysis , cough, dyspnea, wheezing, chest pain, palpitations, orthopnea,, abdominal pain, nausea, melena, diarrhea, constipation, flank pain, dysuria, hematuria, numbness, tingling, seizures,  Focal weakness, Loss of consciousness,  Tremor, insomnia, depression, anxiety, and suicidal ideation.      Objective:  BP 132/66   Pulse (!) 57   Temp 97.9 F (36.6 C) (Oral)   Resp 12   Ht 5\' 1"  (1.549 m)   Wt 170 lb 8 oz (77.3 kg)   SpO2 94%   BMI 32.22 kg/m   Physical Exam   General appearance: alert, cooperative and appears stated age Ears: normal TM's and external ear canals both ears Throat: lips, mucosa, and tongue normal; teeth and gums normal Neck: no adenopathy, no carotid  bruit, supple, symmetrical, trachea midline and thyroid not enlarged, symmetric, no tenderness/mass/nodules Back: symmetric, no curvature. ROM normal. No CVA tenderness. Lungs: clear to auscultation bilaterally Heart: regular rate and rhythm, S1, S2 normal, no murmur, click, rub or gallop Abdomen: soft, non-tender; bowel sounds normal; no masses,  no organomegaly Pulses: 2+ and symmetric Skin: Skin color, texture, turgor normal. No rashes or lesions Lymph nodes: Cervical, supraclavicular, and axillary nodes normal.    Assessment & Plan:   Problem List Items Addressed This Visit    Essential hypertension (Chronic)    Well controlled on current regimen for her age . Renal function stable, no changes today.  Lab Results  Component Value  Date   NA 136 05/08/2016   K 4.6 05/08/2016   CL 102 05/08/2016   CO2 27 05/08/2016   Lab Results  Component Value Date   CREATININE 0.94 05/08/2016         Hyperlipidemia (Chronic)    Goal is LDL < 70 for history of CAD s/p CABG. She is tolerating statin therapy with Lipitor 40 mg daily.  LFts are normal    Lab Results  Component Value Date   ALT 9 05/08/2016   AST 16 05/08/2016   ALKPHOS 58 05/08/2016   BILITOT 0.5 05/08/2016          Hypothyroidism (Chronic)    Thyroid function is WNL on current dose.  No current changes needed.   Lab Results  Component Value Date   TSH 4.03 05/08/2016         Relevant Orders   TSH (Completed)   Iron deficiency anemia    Occurred post operatively from hip surgery. Resolved with iron supplementation which was stopped due to intolerance. Advised to resume iron if tolerated,  And repeat labs in one month.   Lab Results  Component Value Date   WBC 7.8 05/08/2016   HGB 10.2 (L) 05/08/2016   HCT 31.0 (L) 05/08/2016   MCV 87.2 05/08/2016   PLT 178.0 05/08/2016         Relevant Orders   CBC with Differential/Platelet (Completed)   Long-term use of high-risk medication   Relevant Orders   Comprehensive metabolic panel (Completed)   Neoplasm of skin of earlobe    resolved by treatment in July with cryotherapy Phillip Heal)      Nocturia more than twice per night    Dipstick UA, UA with micro and culture ordered at patient request , but she was unable to void.  She has a history of incontinence,  Denies dysuria.  Will not treat empirically.       Relevant Orders   Urine culture   POCT Urinalysis Dipstick   Urinalysis, Routine w reflex microscopic   Visit for preventive health examination    Annual comprehensive preventive exam was done as well as an evaluation and management of chronic conditions .  During the course of the visit the patient was educated and counseled about appropriate screening and preventive services  including :  diabetes screening, lipid analysis with projected  10 year  risk for CAD , nutrition counseling, breast cancer screening  and recommended immunizations.  Printed recommendations for health maintenance screenings was given       Other Visit Diagnoses    Screening for breast cancer    -  Primary   Relevant Orders   MM Digital Diagnostic Bilat   Vitamin D deficiency       Relevant Orders   VITAMIN D 25 Hydroxy (Vit-D  Deficiency, Fractures) (Completed)      I am having Ms. Bergen maintain her Calcium Carbonate-Vitamin D, multivitamin with minerals, acetaminophen, PRESERVISION AREDS 2, aspirin, senna-docusate, Cholecalciferol, torsemide, meloxicam, atorvastatin, torsemide, levothyroxine, pantoprazole, and carvedilol.  No orders of the defined types were placed in this encounter.   There are no discontinued medications.  Follow-up: Return in about 6 months (around 11/06/2016).   [name], MD

## 2016-05-09 ENCOUNTER — Other Ambulatory Visit: Payer: Self-pay | Admitting: Internal Medicine

## 2016-05-09 DIAGNOSIS — R351 Nocturia: Secondary | ICD-10-CM | POA: Insufficient documentation

## 2016-05-09 DIAGNOSIS — Z0000 Encounter for general adult medical examination without abnormal findings: Secondary | ICD-10-CM | POA: Insufficient documentation

## 2016-05-09 DIAGNOSIS — D508 Other iron deficiency anemias: Secondary | ICD-10-CM

## 2016-05-09 NOTE — Assessment & Plan Note (Signed)
Goal is LDL < 70 for history of CAD s/p CABG. She is tolerating statin therapy with Lipitor 40 mg daily.  LFts are normal    Lab Results  Component Value Date   ALT 9 05/08/2016   AST 16 05/08/2016   ALKPHOS 58 05/08/2016   BILITOT 0.5 05/08/2016

## 2016-05-09 NOTE — Assessment & Plan Note (Addendum)
Occurred post operatively from hip surgery. Resolved with iron supplementation which was stopped due to intolerance. Advised to resume iron if tolerated,  And repeat labs in one month.   Lab Results  Component Value Date   WBC 7.8 05/08/2016   HGB 10.2 (L) 05/08/2016   HCT 31.0 (L) 05/08/2016   MCV 87.2 05/08/2016   PLT 178.0 05/08/2016

## 2016-05-09 NOTE — Assessment & Plan Note (Signed)
Well controlled on current regimen for her age . Renal function stable, no changes today.  Lab Results  Component Value Date   NA 136 05/08/2016   K 4.6 05/08/2016   CL 102 05/08/2016   CO2 27 05/08/2016   Lab Results  Component Value Date   CREATININE 0.94 05/08/2016

## 2016-05-09 NOTE — Assessment & Plan Note (Signed)
Recurrent since stopping iron supplementation.  Occurred post operatively from hip surgery. Advised to resume supplement once daily if tolerated and repeat in one month.  Lab Results  Component Value Date   WBC 7.8 05/08/2016   HGB 10.2 (L) 05/08/2016   HCT 31.0 (L) 05/08/2016   MCV 87.2 05/08/2016   PLT 178.0 05/08/2016

## 2016-05-09 NOTE — Assessment & Plan Note (Signed)
resolved by treatment in July with cryotherapy Phillip Heal)

## 2016-05-09 NOTE — Assessment & Plan Note (Addendum)
Dipstick UA, UA with micro and culture ordered at patient request , but she was unable to void.  She has a history of incontinence,  Denies dysuria.  Will not treat empirically.

## 2016-05-09 NOTE — Assessment & Plan Note (Signed)
Annual comprehensive preventive exam was done as well as an evaluation and management of chronic conditions .  During the course of the visit the patient was educated and counseled about appropriate screening and preventive services including :  diabetes screening, lipid analysis with projected  10 year  risk for CAD , nutrition counseling, breast cancer screening  and recommended immunizations.  Printed recommendations for health maintenance screenings was given

## 2016-05-09 NOTE — Assessment & Plan Note (Signed)
Thyroid function is WNL on current dose.  No current changes needed.   Lab Results  Component Value Date   TSH 4.03 05/08/2016

## 2016-05-14 ENCOUNTER — Other Ambulatory Visit: Payer: Self-pay

## 2016-06-05 ENCOUNTER — Other Ambulatory Visit: Payer: Self-pay | Admitting: Internal Medicine

## 2016-06-05 NOTE — Telephone Encounter (Signed)
Carvedilol sent to pharmacy. 

## 2016-06-14 ENCOUNTER — Other Ambulatory Visit (INDEPENDENT_AMBULATORY_CARE_PROVIDER_SITE_OTHER): Payer: Medicare HMO

## 2016-06-14 DIAGNOSIS — D508 Other iron deficiency anemias: Secondary | ICD-10-CM

## 2016-06-14 DIAGNOSIS — R351 Nocturia: Secondary | ICD-10-CM | POA: Diagnosis not present

## 2016-06-14 LAB — CBC WITH DIFFERENTIAL/PLATELET
BASOS ABS: 0.1 10*3/uL (ref 0.0–0.1)
BASOS PCT: 0.7 % (ref 0.0–3.0)
Eosinophils Absolute: 0.2 10*3/uL (ref 0.0–0.7)
Eosinophils Relative: 2.3 % (ref 0.0–5.0)
HCT: 34 % — ABNORMAL LOW (ref 36.0–46.0)
HEMOGLOBIN: 11.4 g/dL — AB (ref 12.0–15.0)
Lymphocytes Relative: 21.7 % (ref 12.0–46.0)
Lymphs Abs: 1.5 10*3/uL (ref 0.7–4.0)
MCHC: 33.6 g/dL (ref 30.0–36.0)
MCV: 89.1 fl (ref 78.0–100.0)
MONOS PCT: 8.8 % (ref 3.0–12.0)
Monocytes Absolute: 0.6 10*3/uL (ref 0.1–1.0)
NEUTROS ABS: 4.7 10*3/uL (ref 1.4–7.7)
Neutrophils Relative %: 66.5 % (ref 43.0–77.0)
PLATELETS: 170 10*3/uL (ref 150.0–400.0)
RBC: 3.82 Mil/uL — ABNORMAL LOW (ref 3.87–5.11)
RDW: 19.4 % — AB (ref 11.5–15.5)
WBC: 7.1 10*3/uL (ref 4.0–10.5)

## 2016-06-14 LAB — URINALYSIS, ROUTINE W REFLEX MICROSCOPIC
BILIRUBIN URINE: NEGATIVE
Hgb urine dipstick: NEGATIVE
Nitrite: NEGATIVE
PH: 5.5 (ref 5.0–8.0)
RBC / HPF: NONE SEEN (ref 0–?)
Specific Gravity, Urine: >=1.03 — AB (ref 1.000–1.030)
Total Protein, Urine: NEGATIVE
Urine Glucose: NEGATIVE
Urobilinogen, UA: 0.2 (ref 0.0–1.0)

## 2016-06-14 LAB — IRON AND TIBC
%SAT: 28 % (ref 11–50)
IRON: 67 ug/dL (ref 45–160)
TIBC: 237 ug/dL — ABNORMAL LOW (ref 250–450)
UIBC: 170 ug/dL (ref 125–400)

## 2016-06-14 LAB — FERRITIN: Ferritin: 29.7 ng/mL (ref 10.0–291.0)

## 2016-06-15 LAB — RETICULOCYTES
ABS Retic: 38500 cells/uL (ref 20000–80000)
RBC.: 3.85 MIL/uL (ref 3.80–5.10)
Retic Ct Pct: 1 %

## 2016-06-16 LAB — URINE CULTURE

## 2016-06-16 LAB — FOLATE RBC: RBC Folate: 985 ng/mL (ref >280)

## 2016-06-17 LAB — ERYTHROPOIETIN: Erythropoietin: 17.9 m[IU]/mL (ref 2.6–18.5)

## 2016-06-17 LAB — VITAMIN B12: VITAMIN B 12: 691 pg/mL (ref 211–911)

## 2016-06-19 ENCOUNTER — Other Ambulatory Visit: Payer: Self-pay | Admitting: Internal Medicine

## 2016-06-19 DIAGNOSIS — D508 Other iron deficiency anemias: Secondary | ICD-10-CM

## 2016-07-04 DIAGNOSIS — H2511 Age-related nuclear cataract, right eye: Secondary | ICD-10-CM | POA: Diagnosis not present

## 2016-07-11 ENCOUNTER — Encounter: Payer: Self-pay | Admitting: *Deleted

## 2016-07-15 NOTE — Discharge Instructions (Signed)
Cataract Surgery, Care After °Refer to this sheet in the next few weeks. These instructions provide you with information about caring for yourself after your procedure. Your health care provider may also give you more specific instructions. Your treatment has been planned according to current medical practices, but problems sometimes occur. Call your health care provider if you have any problems or questions after your procedure. °What can I expect after the procedure? °After the procedure, it is common to have: °· Itching. °· Discomfort. °· Fluid discharge. °· Sensitivity to light and to touch. °· Bruising. °Follow these instructions at home: °Eye Care  °· Check your eye every day for signs of infection. Watch for: °¨ Redness, swelling, or pain. °¨ Fluid, blood, or pus. °¨ Warmth. °¨ Bad smell. °Activity  °· Avoid strenuous activities, such as playing contact sports, for as long as told by your health care provider. °· Do not drive or operate heavy machinery until your health care provider approves. °· Do not bend or lift heavy objects . Bending increases pressure in the eye. You can walk, climb stairs, and do light household chores. °· Ask your health care provider when you can return to work. If you work in a dusty environment, you may be advised to wear protective eyewear for a period of time. °General instructions  °· Take or apply over-the-counter and prescription medicines only as told by your health care provider. This includes eye drops. °· Do not touch or rub your eyes. °· If you were given a protective shield, wear it as told by your health care provider. If you were not given a protective shield, wear sunglasses as told by your health care provider to protect your eyes. °· Keep the area around your eye clean and dry. Avoid swimming or allowing water to hit you directly in the face while showering until told by your health care provider. Keep soap and shampoo out of your eyes. °· Do not put a contact lens  into the affected eye or eyes until your health care provider approves. °· Keep all follow-up visits as told by your health care provider. This is important. °Contact a health care provider if: ° °· You have increased bruising around your eye. °· You have pain that is not helped with medicine. °· You have a fever. °· You have redness, swelling, or pain in your eye. °· You have fluid, blood, or pus coming from your incision. °· Your vision gets worse. °Get help right away if: °· You have sudden vision loss. °This information is not intended to replace advice given to you by your health care provider. Make sure you discuss any questions you have with your health care provider. °Document Released: 12/07/2004 Document Revised: 09/28/2015 Document Reviewed: 03/30/2015 °Elsevier Interactive Patient Education © 2017 [employer] ° ° ° ° °General Anesthesia, Adult, Care After °These instructions provide you with information about caring for yourself after your procedure. Your health care provider may also give you more specific instructions. Your treatment has been planned according to current medical practices, but problems sometimes occur. Call your health care provider if you have any problems or questions after your procedure. °What can I expect after the procedure? °After the procedure, it is common to have: °· Vomiting. °· A sore throat. °· Mental slowness. °It is common to feel: °· Nauseous. °· Cold or shivery. °· Sleepy. °· Tired. °· Sore or achy, even in parts of your body where you did not have surgery. °Follow these instructions at   home: °For at least 24 hours after the procedure:  °· Do not: °¨ Participate in activities where you could fall or become injured. °¨ Drive. °¨ Use heavy machinery. °¨ Drink alcohol. °¨ Take sleeping pills or medicines that cause drowsiness. °¨ Make important decisions or sign legal documents. °¨ Take care of children on your own. °· Rest. °Eating and drinking  °· If you vomit, drink  water, juice, or soup when you can drink without vomiting. °· Drink enough fluid to keep your urine clear or pale yellow. °· Make sure you have little or no nausea before eating solid foods. °· Follow the diet recommended by your health care provider. °General instructions  °· Have a responsible adult stay with you until you are awake and alert. °· Return to your normal activities as told by your health care provider. Ask your health care provider what activities are safe for you. °· Take over-the-counter and prescription medicines only as told by your health care provider. °· If you smoke, do not smoke without supervision. °· Keep all follow-up visits as told by your health care provider. This is important. °Contact a health care provider if: °· You continue to have nausea or vomiting at home, and medicines are not helpful. °· You cannot drink fluids or start eating again. °· You cannot urinate after 8-12 hours. °· You develop a skin rash. °· You have fever. °· You have increasing redness at the site of your procedure. °Get help right away if: °· You have difficulty breathing. °· You have chest pain. °· You have unexpected bleeding. °· You feel that you are having a life-threatening or urgent problem. °This information is not intended to replace advice given to you by your health care provider. Make sure you discuss any questions you have with your health care provider. °Document Released: 08/26/2000 Document Revised: 10/23/2015 Document Reviewed: 05/04/2015 °Elsevier Interactive Patient Education © 2017 [employer] ° °

## 2016-07-16 ENCOUNTER — Encounter
Admission: RE | Disposition: A | Discharge status: Home / Self Care | Payer: Self-pay | Source: Ambulatory Visit | Attending: Ophthalmology

## 2016-07-16 ENCOUNTER — Encounter: Payer: Self-pay | Admitting: *Deleted

## 2016-07-16 ENCOUNTER — Ambulatory Visit: Payer: Medicare HMO | Admitting: Anesthesiology

## 2016-07-16 ENCOUNTER — Ambulatory Visit
Admission: RE | Admit: 2016-07-16 | Discharge: 2016-07-16 | Disposition: A | Discharge status: Home / Self Care | Payer: Medicare HMO | Source: Ambulatory Visit | Attending: Ophthalmology | Admitting: Ophthalmology

## 2016-07-16 ENCOUNTER — Other Ambulatory Visit: Payer: Self-pay | Admitting: Internal Medicine

## 2016-07-16 ENCOUNTER — Other Ambulatory Visit: Payer: Self-pay | Admitting: Cardiovascular Disease

## 2016-07-16 DIAGNOSIS — H3530 Unspecified macular degeneration: Secondary | ICD-10-CM | POA: Diagnosis not present

## 2016-07-16 DIAGNOSIS — Z7982 Long term (current) use of aspirin: Secondary | ICD-10-CM | POA: Insufficient documentation

## 2016-07-16 DIAGNOSIS — I10 Essential (primary) hypertension: Secondary | ICD-10-CM | POA: Diagnosis not present

## 2016-07-16 DIAGNOSIS — Z951 Presence of aortocoronary bypass graft: Secondary | ICD-10-CM | POA: Insufficient documentation

## 2016-07-16 DIAGNOSIS — Z79899 Other long term (current) drug therapy: Secondary | ICD-10-CM | POA: Insufficient documentation

## 2016-07-16 DIAGNOSIS — Z791 Long term (current) use of non-steroidal anti-inflammatories (NSAID): Secondary | ICD-10-CM | POA: Diagnosis not present

## 2016-07-16 DIAGNOSIS — I2510 Atherosclerotic heart disease of native coronary artery without angina pectoris: Secondary | ICD-10-CM | POA: Insufficient documentation

## 2016-07-16 DIAGNOSIS — K219 Gastro-esophageal reflux disease without esophagitis: Secondary | ICD-10-CM | POA: Diagnosis not present

## 2016-07-16 DIAGNOSIS — H2511 Age-related nuclear cataract, right eye: Secondary | ICD-10-CM | POA: Insufficient documentation

## 2016-07-16 DIAGNOSIS — D649 Anemia, unspecified: Secondary | ICD-10-CM | POA: Insufficient documentation

## 2016-07-16 HISTORY — PX: CATARACT EXTRACTION W/PHACO: SHX586

## 2016-07-16 HISTORY — PX: ANTERIOR VITRECTOMY: SHX1173

## 2016-07-16 SURGERY — PHACOEMULSIFICATION, CATARACT, WITH IOL INSERTION
Anesthesia: Monitor Anesthesia Care | Laterality: Right | Wound class: Clean

## 2016-07-16 MED ORDER — CARBACHOL 0.01 % IO SOLN
INTRAOCULAR | Status: DC | PRN
  Administered 2016-07-16: 0.5 mL via INTRAOCULAR

## 2016-07-16 MED ORDER — MIDAZOLAM HCL 2 MG/2ML IJ SOLN
INTRAMUSCULAR | Status: DC | PRN
  Administered 2016-07-16: 1.5 mg via INTRAVENOUS
  Administered 2016-07-16: 0.5 mg via INTRAVENOUS

## 2016-07-16 MED ORDER — SODIUM HYALURONATE 23 MG/ML IO SOLN
INTRAOCULAR | Status: DC | PRN
  Administered 2016-07-16: 0.6 mL via INTRAOCULAR

## 2016-07-16 MED ORDER — SODIUM HYALURONATE 10 MG/ML IO SOLN
INTRAOCULAR | Status: DC | PRN
  Administered 2016-07-16: 0.55 mL via INTRAOCULAR

## 2016-07-16 MED ORDER — LIDOCAINE HCL (PF) 2 % IJ SOLN
INTRAOCULAR | Status: DC | PRN
  Administered 2016-07-16: .5 mL via INTRAOCULAR

## 2016-07-16 MED ORDER — MOXIFLOXACIN HCL 0.5 % OP SOLN
OPHTHALMIC | Status: DC | PRN
  Administered 2016-07-16: 0.2 mL via OPHTHALMIC

## 2016-07-16 MED ORDER — EPINEPHRINE PF 1 MG/ML IJ SOLN
INTRAOCULAR | Status: DC | PRN
  Administered 2016-07-16: 82 mL via OPHTHALMIC

## 2016-07-16 MED ORDER — ERYTHROMYCIN 5 MG/GM OP OINT
TOPICAL_OINTMENT | OPHTHALMIC | Status: DC | PRN
  Administered 2016-07-16: 1 via OPHTHALMIC

## 2016-07-16 MED ORDER — FENTANYL CITRATE (PF) 100 MCG/2ML IJ SOLN
INTRAMUSCULAR | Status: DC | PRN
  Administered 2016-07-16 (×2): 50 ug via INTRAVENOUS

## 2016-07-16 MED ORDER — ARMC OPHTHALMIC DILATING DROPS
1.0000 "application " | OPHTHALMIC | Status: DC | PRN
  Administered 2016-07-16 (×3): 1 via OPHTHALMIC

## 2016-07-16 MED ORDER — NA CHONDROIT SULF-NA HYALURON 40-30 MG/ML IO SOLN
INTRAOCULAR | Status: DC | PRN
  Administered 2016-07-16: 0.5 mL via INTRAOCULAR

## 2016-07-16 SURGICAL SUPPLY — 20 items
CANNULA ANT/CHMB 27GA (MISCELLANEOUS) ×3 IMPLANT
CUP MEDICINE 2OZ PLAST GRAD ST (MISCELLANEOUS) ×3 IMPLANT
DISSECTOR HYDRO NUCLEUS 50X22 (MISCELLANEOUS) ×3 IMPLANT
GLOVE BIO SURGEON STRL SZ8 (GLOVE) ×3 IMPLANT
GLOVE SURG LX 7.5 STRW (GLOVE) ×2
GLOVE SURG LX STRL 7.5 STRW (GLOVE) ×1 IMPLANT
GOWN STRL REUS W/ TWL LRG LVL3 (GOWN DISPOSABLE) ×2 IMPLANT
GOWN STRL REUS W/TWL LRG LVL3 (GOWN DISPOSABLE) ×4
LENS IOL ACRSF MP 22.0 (Intraocular Lens) ×1 IMPLANT
LENS IOL ACRYSOF POST 22.0 (Intraocular Lens) ×3 IMPLANT
LENS IOL TECNIS ITEC 23.0 (Intraocular Lens) IMPLANT
MARKER SKIN DUAL TIP RULER LAB (MISCELLANEOUS) ×3 IMPLANT
PACK CATARACT (MISCELLANEOUS) ×3 IMPLANT
PACK CATARACT BRASINGTON (MISCELLANEOUS) ×3 IMPLANT
PACK EYE AFTER SURG (MISCELLANEOUS) ×3 IMPLANT
PACK VITRECTOMY ANT (MISCELLANEOUS) ×3 IMPLANT
SYR 3ML LL SCALE MARK (SYRINGE) ×3 IMPLANT
SYR TB 1ML LUER SLIP (SYRINGE) ×3 IMPLANT
WATER STERILE IRR 250ML POUR (IV SOLUTION) ×3 IMPLANT
WIPE NON LINTING 3.25X3.25 (MISCELLANEOUS) ×3 IMPLANT

## 2016-07-16 NOTE — OR Nursing (Signed)
Fluorescein Sodium Sterile Opht. Strips used per Dr. Edison Pace

## 2016-07-16 NOTE — Anesthesia Postprocedure Evaluation (Signed)
Anesthesia Post Note  Patient: Jenna Marshall  Procedure(s) Performed: Procedure(s) (LRB): CATARACT EXTRACTION PHACO AND INTRAOCULAR LENS PLACEMENT (IOC)  Right (Right) ANTERIOR VITRECTOMY (Right)  Patient location during evaluation: PACU Anesthesia Type: MAC Level of consciousness: awake and alert and oriented Pain management: pain level controlled Vital Signs Assessment: post-procedure vital signs reviewed and stable Respiratory status: spontaneous breathing and nonlabored ventilation Cardiovascular status: stable Postop Assessment: no signs of nausea or vomiting and adequate PO intake Anesthetic complications: no    Estill Batten

## 2016-07-16 NOTE — Anesthesia Procedure Notes (Signed)
Procedure Name: MAC Performed by: Mayme Genta Pre-anesthesia Checklist: Patient identified, Emergency Drugs available, Suction available, Timeout performed and Patient being monitored Patient Re-evaluated:Patient Re-evaluated prior to inductionOxygen Delivery Method: Nasal cannula Placement Confirmation: positive ETCO2

## 2016-07-16 NOTE — H&P (Signed)
The History and Physical notes are on paper, have been signed, and are to be scanned. The patient remains stable and unchanged from the H&P.   Previous H&P reviewed, patient examined, and there are no changes.  Jenna Marshall 07/16/2016 8:44 AM

## 2016-07-16 NOTE — Op Note (Signed)
OPERATIVE NOTE  Jenna Marshall [id number] 07/16/2016   PREOPERATIVE DIAGNOSIS:   1.  Dense Nuclear sclerotic cataract right eye.  H25.11 2.  Age related macular degeneration, right eye.  POSTOPERATIVE DIAGNOSIS:     1.  Dense Nuclear sclerotic cataract right eye.  H25.11 2.  Age related macular degeneration, right eye.   PROCEDURE:   1.  Phacoemusification with intraocular lens placement of the right eye  [phone] 2.  Anterior vitrectomy, right eye.  LENS:   Implant Name Type Inv. Item Serial No. Manufacturer Lot No. LRB No. Used  LENS IOL DIOP 23.0 - [id number] Intraocular Lens LENS IOL DIOP 23.0 [id number]  Right 1  LENS IOL POST 22.0 - [id number] Intraocular Lens LENS IOL POST 22.0 [id number] ALCON   Right 1       [id number] 22.0   3 piece lens placed in the sulcus.   ULTRASOUND TIME: 0 minutes 16 seconds.  CDE 3.91   SURGEON:  Benay Pillow, MD, MPH  ANESTHESIOLOGIST: Anesthesiologist: Estill Batten, DO CRNA: Mayme Genta, CRNA   ANESTHESIA:  Topical with tetracaine drops augmented with 1% preservative-free intracameral lidocaine.  ESTIMATED BLOOD LOSS: less than 1 mL.   COMPLICATIONS:  Loss of lens fragments to the posterior chamber..   DESCRIPTION OF PROCEDURE:  The patient was identified in the holding room and transported to the operating room and placed in the supine position under the operating microscope.  The right eye was identified as the operative eye and it was prepped and draped in the usual sterile ophthalmic fashion.   A 1.0 millimeter clear-corneal paracentesis was made at the 10:30 position. 0.5 ml of preservative-free 1% lidocaine with epinephrine was injected into the anterior chamber.  The anterior chamber was filled with Healon 5 viscoelastic.  A 2.4 millimeter keratome was used to make a near-clear corneal incision at the 8:00 position.  A curvilinear capsulorrhexis was made with a cystotome and capsulorrhexis forceps.  Balanced salt solution was  used to hydrodissect and hydrodelineate the nucleus.   Phacoemulsification was then used in stop and chop fashion.  The lens had been bisected and 2 horizontal chops performed, a small fragment was removed.    Shortly thereafter the entire lens rapidly drifted posteriorly and could not be retrieved.  Viscoat was injected to tamponade the vitreous and the phaco handpiece was removed.  An anterior vitrectomy was performed and the remaining cortex was then removed using the vitrector.  Viscoat injected in the anterior chamber and the main wound was enlarged with a keratome.  A 3 piece lens was then injected into the sulcus and centered with a sinsky hook.  The main wound was sutured with a 10-0 nylon and the remaining viscoelastic was aspirated with the vitrector.   Wounds were hydrated with balanced salt solution.  The anterior chamber was inflated to a physiologic pressure with balanced salt solution. The wounds were checked with a fluorescein strip and no leak was detected.    Miostat was injected, with minimal respsonse from the pupil, which remained round.  Intracameral vigamox 0.1 mL undiluted was injected into the eye and a drop placed onto the ocular surface.  Emycin ointment was placed on the patient's eye and a patch and shield.  The patient was taken to the recovery room in stable condition.  Benay Pillow 07/16/2016, 9:51 AM

## 2016-07-16 NOTE — Anesthesia Preprocedure Evaluation (Signed)
Anesthesia Evaluation  Patient identified by MRN, date of birth, ID band Patient awake    Reviewed: Allergy & Precautions, H&P , NPO status , Patient's Chart, lab work & pertinent test results, reviewed documented beta blocker date and time   Airway Mallampati: II  TM Distance: >3 FB Neck ROM: full    Dental  (+) Partial Upper   Pulmonary neg pulmonary ROS,    Pulmonary exam normal breath sounds clear to auscultation       Cardiovascular Exercise Tolerance: Good hypertension, + angina + CAD and + CABG   Rhythm:regular Rate:Normal     Neuro/Psych PSYCHIATRIC DISORDERS (depression) negative neurological ROS     GI/Hepatic Neg liver ROS, GERD  ,  Endo/Other  Hypothyroidism   Renal/GU negative Renal ROS  negative genitourinary   Musculoskeletal  (+) Arthritis , Osteoarthritis,    Abdominal   Peds  Hematology  (+) anemia ,   Anesthesia Other Findings   Reproductive/Obstetrics negative OB ROS                             Anesthesia Physical  Anesthesia Plan  ASA: III  Anesthesia Plan: MAC   Post-op Pain Management:    Induction:   Airway Management Planned:   Additional Equipment:   Intra-op Plan:   Post-operative Plan:   Informed Consent: I have reviewed the patients History and Physical, chart, labs and discussed the procedure including the risks, benefits and alternatives for the proposed anesthesia with the patient or authorized representative who has indicated his/her understanding and acceptance.   Dental Advisory Given  Plan Discussed with: CRNA  Anesthesia Plan Comments:         Anesthesia Quick Evaluation

## 2016-07-16 NOTE — Transfer of Care (Signed)
Immediate Anesthesia Transfer of Care Note  Patient: Jenna Marshall  Procedure(s) Performed: Procedure(s): CATARACT EXTRACTION PHACO AND INTRAOCULAR LENS PLACEMENT (IOC)  Right (Right) ANTERIOR VITRECTOMY (Right)  Patient Location: PACU  Anesthesia Type: MAC  Level of Consciousness: awake, alert  and patient cooperative  Airway and Oxygen Therapy: Patient Spontanous Breathing and Patient connected to supplemental oxygen  Post-op Assessment: Post-op Vital signs reviewed, Patient's Cardiovascular Status Stable, Respiratory Function Stable, Patent Airway and No signs of Nausea or vomiting  Post-op Vital Signs: Reviewed and stable  Complications: No apparent anesthesia complications

## 2016-07-17 ENCOUNTER — Encounter: Payer: Self-pay | Admitting: Ophthalmology

## 2016-07-18 DIAGNOSIS — I509 Heart failure, unspecified: Secondary | ICD-10-CM | POA: Diagnosis not present

## 2016-07-18 DIAGNOSIS — Z96642 Presence of left artificial hip joint: Secondary | ICD-10-CM | POA: Diagnosis not present

## 2016-07-18 DIAGNOSIS — I110 Hypertensive heart disease with heart failure: Secondary | ICD-10-CM | POA: Diagnosis not present

## 2016-07-18 DIAGNOSIS — H43819 Vitreous degeneration, unspecified eye: Secondary | ICD-10-CM | POA: Diagnosis not present

## 2016-07-18 DIAGNOSIS — H59021 Cataract (lens) fragments in eye following cataract surgery, right eye: Secondary | ICD-10-CM | POA: Diagnosis not present

## 2016-07-18 DIAGNOSIS — M1990 Unspecified osteoarthritis, unspecified site: Secondary | ICD-10-CM | POA: Diagnosis not present

## 2016-07-18 DIAGNOSIS — Z888 Allergy status to other drugs, medicaments and biological substances status: Secondary | ICD-10-CM | POA: Diagnosis not present

## 2016-07-18 DIAGNOSIS — Z885 Allergy status to narcotic agent status: Secondary | ICD-10-CM | POA: Diagnosis not present

## 2016-07-18 DIAGNOSIS — Z79899 Other long term (current) drug therapy: Secondary | ICD-10-CM | POA: Diagnosis not present

## 2016-07-23 ENCOUNTER — Other Ambulatory Visit: Payer: Self-pay

## 2016-07-24 ENCOUNTER — Other Ambulatory Visit (INDEPENDENT_AMBULATORY_CARE_PROVIDER_SITE_OTHER): Payer: Medicare HMO

## 2016-07-24 DIAGNOSIS — D508 Other iron deficiency anemias: Secondary | ICD-10-CM

## 2016-07-24 LAB — CBC WITH DIFFERENTIAL/PLATELET
Basophils Absolute: 0.1 10*3/uL (ref 0.0–0.1)
Basophils Relative: 1.2 % (ref 0.0–3.0)
EOS ABS: 0.2 10*3/uL (ref 0.0–0.7)
Eosinophils Relative: 2.8 % (ref 0.0–5.0)
HEMATOCRIT: 37.4 % (ref 36.0–46.0)
HEMOGLOBIN: 12.5 g/dL (ref 12.0–15.0)
LYMPHS PCT: 23.8 % (ref 12.0–46.0)
Lymphs Abs: 1.8 10*3/uL (ref 0.7–4.0)
MCHC: 33.3 g/dL (ref 30.0–36.0)
MCV: 92 fl (ref 78.0–100.0)
Monocytes Absolute: 0.6 10*3/uL (ref 0.1–1.0)
Monocytes Relative: 8 % (ref 3.0–12.0)
NEUTROS ABS: 5 10*3/uL (ref 1.4–7.7)
Neutrophils Relative %: 64.2 % (ref 43.0–77.0)
PLATELETS: 151 10*3/uL (ref 150.0–400.0)
RBC: 4.07 Mil/uL (ref 3.87–5.11)
RDW: 18.8 % — ABNORMAL HIGH (ref 11.5–15.5)
WBC: 7.7 10*3/uL (ref 4.0–10.5)

## 2016-07-24 LAB — IRON AND TIBC
%SAT: 20 % (ref 11–50)
IRON: 49 ug/dL (ref 45–160)
TIBC: 244 ug/dL — ABNORMAL LOW (ref 250–450)
UIBC: 195 ug/dL (ref 125–400)

## 2016-07-24 LAB — FERRITIN: Ferritin: 27.5 ng/mL (ref 10.0–291.0)

## 2016-08-06 ENCOUNTER — Ambulatory Visit: Payer: Self-pay

## 2016-08-19 ENCOUNTER — Other Ambulatory Visit: Payer: Self-pay | Admitting: Internal Medicine

## 2016-08-23 ENCOUNTER — Other Ambulatory Visit: Payer: Self-pay | Admitting: Internal Medicine

## 2016-08-23 DIAGNOSIS — Z1231 Encounter for screening mammogram for malignant neoplasm of breast: Secondary | ICD-10-CM

## 2016-08-28 ENCOUNTER — Ambulatory Visit
Admission: RE | Admit: 2016-08-28 | Discharge: 2016-08-28 | Disposition: A | Discharge status: Home / Self Care | Payer: Medicare HMO | Source: Ambulatory Visit | Attending: Internal Medicine | Admitting: Internal Medicine

## 2016-08-28 ENCOUNTER — Ambulatory Visit: Payer: Commercial Managed Care - HMO

## 2016-08-28 DIAGNOSIS — Z1231 Encounter for screening mammogram for malignant neoplasm of breast: Secondary | ICD-10-CM | POA: Insufficient documentation

## 2016-09-04 ENCOUNTER — Ambulatory Visit: Payer: Self-pay

## 2016-09-04 ENCOUNTER — Ambulatory Visit (INDEPENDENT_AMBULATORY_CARE_PROVIDER_SITE_OTHER): Payer: Medicare HMO

## 2016-09-04 VITALS — BP 142/62 | HR 69 | Temp 97.6°F | Resp 14 | Ht 61.0 in | Wt 175.8 lb

## 2016-09-04 DIAGNOSIS — Z0000 Encounter for general adult medical examination without abnormal findings: Secondary | ICD-10-CM

## 2016-09-04 NOTE — Progress Notes (Signed)
Subjective:   Jenna Marshall is a 83 y.o. female who presents for Medicare Annual (Subsequent) preventive examination.  Review of Systems:  No ROS.  Medicare Wellness Visit.  Cardiac Risk Factors include: advanced age (>25men, >67 women);hypertension;obesity (BMI >30kg/m2)     Objective:     Vitals: BP (!) 142/62 (BP Location: Left Arm, Patient Position: Sitting, Cuff Size: Normal)   Pulse 69   Temp 97.6 F (36.4 C) (Oral)   Resp 14   Ht 5\' 1"  (1.549 m)   Wt 175 lb 12.8 oz (79.7 kg)   SpO2 97%   BMI 33.22 kg/m   Body mass index is 33.22 kg/m.   Tobacco History  Smoking Status  . Never Smoker  Smokeless Tobacco  . Never Used     Counseling given: Not Answered   Past Medical History:  Diagnosis Date  . Arthritis   . CAD (coronary artery disease)    a. Lexiscan 10/13/14: mid anterior to apical & inf wall ischemia w/ WMA, mild to mod dep EF; b. cardiac cath 10/14/2014: ost LM to LM 50%, LM 99%, ost LAD 95%, ost LAD 95%, prox LAD to mid LAD 40%, prox RCA 30%, mid RCA 70%, Critical left main stenosis. Significant distal RCA stenosis. Heavily calcified arteries. Normal EF by echo with no significant AS or MR. Recommend urgent CABG. cCABG x 4 (5/201  . CAD (coronary artery disease)    a. Lexi 10/13/14: mid ant to apical & inf wall ischemia w/ WMA, mild-mod dep EF; b. cath 10/14/14: ost LM to LM 50%, dLM 99%, ost LAD 95%, ost LAD 95%, pLAD-LAD 40%, pRCA 30%, mRCA 70%, Critical LM stenosis. Sig dRCA stenosis. Heavily calcified arteries; c. CABG x 4 (10/2014)  . Depression   . Diastolic dysfunction    a. echo 10/2014: EF 55-60%, no RWMA, GR1DD, mild AI, trivial MR, mildly dilated LA, PASP normal  . GERD (gastroesophageal reflux disease)   . Hyperlipemia   . Hyperlipidemia   . Hypertension   . Hypothyroidism   . Macular degeneration   . pernicious anemia   . Varicose veins   . Wears dentures    partial upper  . Wears hearing aid    right   Past Surgical History:    Procedure Laterality Date  . ANTERIOR VITRECTOMY Right 07/16/2016   Procedure: ANTERIOR VITRECTOMY;  Surgeon: Eulogio Bear, MD;  Location: [place];  Service: Ophthalmology;  Laterality: Right;  . CARDIAC CATHETERIZATION Left 10/14/2014   Procedure: Left Heart Cath and Coronary Angiography;  Surgeon: Wellington Hampshire, MD;  Location: [facility];  Service: Cardiovascular;  Laterality: Left;  . CATARACT EXTRACTION W/PHACO Left 04/30/2016   Procedure: CATARACT EXTRACTION PHACO AND INTRAOCULAR LENS PLACEMENT (IOC);  Surgeon: Eulogio Bear, MD;  Location: [place];  Service: Ophthalmology;  Laterality: Left;  LEFT  . CATARACT EXTRACTION W/PHACO Right 07/16/2016   Procedure: CATARACT EXTRACTION PHACO AND INTRAOCULAR LENS PLACEMENT ([place])  Right;  Surgeon: Eulogio Bear, MD;  Location: [place];  Service: Ophthalmology;  Laterality: Right;  . CORONARY ARTERY BYPASS GRAFT N/A 10/14/2014   Procedure: CORONARY ARTERY BYPASS GRAFTING (CABG), ON PUMP, TIMES FOUR, USING LEFT INTERNAL MAMMARY ARTERY, RIGHT GREATER SAPHENOUS VEIN HARVESTED ENDOSCOPICALLY;  Surgeon: Gaye Pollack, MD;  Location: [place];  Service: Open Heart Surgery;  Laterality: N/A;  . CORONARY ARTERY BYPASS GRAFT  10/2014   LIMA-->LAD, SVG-->RCA, sequential SVG-->Ramus and OM  . HEMORRHOID SURGERY    .  HIP ARTHROPLASTY Left 01/14/2015   Procedure: ARTHROPLASTY BIPOLAR HIP (HEMIARTHROPLASTY);  Surgeon: Dereck Leep, MD;  Location: [facility];  Service: Orthopedics;  Laterality: Left;  Marland Kitchen VARICOSE VEIN SURGERY     Family History  Problem Relation Age of Onset  . Coronary artery disease Mother   . Diabetes Mother   . Heart disease Father   . Coronary artery disease Brother   . Breast cancer Neg Hx    History  Sexual Activity  . Sexual activity: No    Outpatient Encounter Prescriptions as of 09/04/2016  Medication Sig  . acetaminophen (TYLENOL) 325 MG tablet Take 2 tablets (650 mg total)  by mouth every 6 (six) hours as needed for mild pain.  Marland Kitchen aspirin 81 MG chewable tablet Chew 1 tablet (81 mg total) by mouth daily.  Marland Kitchen atorvastatin (LIPITOR) 40 MG tablet TAKE ONE TABLET BY MOUTH EVERY DAY  . Calcium Carbonate-Vitamin D (CALCIUM + D) 600-200 MG-UNIT TABS Take 1 tablet by mouth 2 (two) times daily.   . carvedilol (COREG) 3.125 MG tablet TAKE ONE TABLET BY MOUTH TWICE DAILY WITH MEALS  . Cholecalciferol (D3 DOTS) 2000 UNITS TBDP Take 2,000 Units by mouth.  . Ferrous Sulfate (SLOW FE PO) Take by mouth daily.  Marland Kitchen levothyroxine (SYNTHROID, LEVOTHROID) 25 MCG tablet TAKE ONE TABLET BY MOUTH EVERY MORNING BEFORE BREAKFAST FOR THYROID  . meloxicam (MOBIC) 15 MG tablet Take 1 tablet (15 mg total) by mouth daily as needed.  . Multiple Vitamin (MULTIVITAMIN WITH MINERALS) TABS tablet Take 1 tablet by mouth daily.  . Multiple Vitamins-Minerals (PRESERVISION AREDS 2) CAPS Take 1 capsule by mouth 2 (two) times daily.   . pantoprazole (PROTONIX) 40 MG tablet TAKE ONE TABLET BY MOUTH EVERY DAY  . senna-docusate (SENOKOT-S) 8.6-50 MG per tablet Take 1 tablet by mouth 2 (two) times daily.  Marland Kitchen torsemide (DEMADEX) 10 MG tablet As needed for overnight weight gain of 2 lbs  . torsemide (DEMADEX) 10 MG tablet Take 1 tablet (10 mg total) by mouth daily. As needed for weight gain over 2 pounds.   No facility-administered encounter medications on file as of 09/04/2016.     Activities of Daily Living In your present state of health, do you have any difficulty performing the following activities: 09/04/2016 07/16/2016  Hearing? Tempie Donning  Vision? N N  Difficulty concentrating or making decisions? N N  Walking or climbing stairs? Y N  Dressing or bathing? N N  Doing errands, shopping? N -  Preparing Food and eating ? N -  Using the Toilet? N -  In the past six months, have you accidently leaked urine? Y -  Do you have problems with loss of bowel control? N -  Managing your Medications? Y -  Managing your  Finances? Y -  Housekeeping or managing your Housekeeping? Y -  Some recent data might be hidden    Patient Care Team: [name], MD as PCP - General (Internal Medicine) [name], MD (Internal Medicine) Hessie Knows, MD as Consulting Physician (Orthopedic Surgery)    Assessment:    This is a routine wellness examination for [place]. The goal of the wellness visit is to assist the patient how to close the gaps in care and create a preventative care plan for the patient.   Taking calcium VIT D as appropriate/Osteoporosis risk reviewed.  Medications reviewed; taking without issues or barriers.  Safety issues reviewed; smoke detectors in the home. No firearms in the home.  Wears  seatbelts when driving or riding with others. Patient does wear sunscreen or protective clothing when in direct sunlight. No violence in the home.  Patient is alert, normal appearance, oriented to person/place/and time. Correctly identified the president of the [place], recall of 3/3 words, and performing simple calculations.  Patient displays appropriate judgement and can read correct time from watch face.  No new identified risk were noted.  No failures at ADL's or IADL's.   BMI- discussed the importance of a healthy diet, water intake and exercise. Educational material provided.   HTN- followed by PCP.  Dental- Dental visits every 6 months. Wears dentures. Followed by Dr. Sherril Cong.  Eye- Visual acuity not assessed per patient preference since they have regular follow up with the ophthalmologist.  Wears corrective lenses.  Sleep patterns- Sleeps 6 hours at night.  Wakes feeling rested.  Health maintenance gaps- closed.  Patient Concerns: None at this time. Follow up with PCP as needed.  Exercise Activities and Dietary recommendations Current Exercise Habits: Home exercise routine, Type of exercise: walking (Chair/standing exercises), Time (Minutes): 10, Frequency (Times/Week): 5, Weekly  Exercise (Minutes/Week): 50, Intensity: Mild  Goals    . Healthy Lifestyle          Stay active and continue chair/standing exercises.  Increase walking as tolerated. Stay hydrated  Low carb foods.  Lean meats, vegetables.      Fall Risk Fall Risk  09/04/2016 07/31/2015 03/10/2015 01/26/2015 01/02/2015  Falls in the past year? No Yes Yes No No  Number falls in past yr: - 1 1 - -  Injury with Fall? - Yes Yes - -  Risk Factor Category  - (No Data) - - -  Risk for fall due to : - Other (Comment) Impaired mobility;Medication side effect History of fall(s);Impaired balance/gait;Impaired mobility -  Risk for fall due to (comments): - Trip over chair. - - -  Follow up - Falls evaluation completed Education provided;Falls prevention discussed - -   Depression Screen PHQ 2/9 Scores 09/04/2016 07/31/2015 03/10/2015 01/26/2015  PHQ - 2 Score 0 0 0 0     Cognitive Function     6CIT Screen 09/04/2016  What Year? 0 points  What month? 0 points  What time? 0 points  Count back from 20 0 points  Months in reverse 0 points  Repeat phrase 0 points  Total Score 0    Immunization History  Administered Date(s) Administered  . Influenza Split 02/27/2011, 03/04/2012  . Influenza, High Dose Seasonal PF 03/31/2013  . Influenza,inj,Quad PF,36+ Mos 03/30/2014  . Influenza-Unspecified 02/06/2015, 02/06/2016  . Pneumococcal Conjugate-13 03/30/2014  . Pneumococcal Polysaccharide-23 03/10/2009, 04/19/2015  . Tdap 10/06/2013  . Zoster 03/11/2011   Screening Tests Health Maintenance  Topic Date Due  . INFLUENZA VACCINE  01/01/2017  . TETANUS/TDAP  10/07/2023  . DEXA SCAN  Completed  . PNA vac Low Risk Adult  Completed      Plan:    End of life planning; Advance aging; Advanced directives discussed. Copy of current HCPOA/Living Will requested.    Medicare Attestation I have personally reviewed: The patient's medical and social history Their use of alcohol, tobacco or illicit drugs Their current  medications and supplements The patient's functional ability including ADLs,fall risks, home safety risks, cognitive, and hearing and visual impairment Diet and physical activities Evidence for depression   The patient's weight, height, BMI, and visual acuity have been recorded in the chart.  I have made referrals and provided education to the patient based on  review of the above and I have provided the patient with a written personalized care plan for preventive services.    During the course of the visit the patient was educated and counseled about the following appropriate screening and preventive services:   Vaccines to include Pneumoccal, Influenza, Hepatitis B, Td, Zostavax, HCV  Colorectal cancer screening-UTD  Bone density screening-UTD  Glaucoma screening-annual eye exam  Mammography-UTD  Nutrition counseling   Patient Instructions (the written plan) was given to the patient.   Varney Biles, LPN  08/03/6710

## 2016-09-04 NOTE — Patient Instructions (Addendum)
  Jenna Marshall , Thank you for taking time to come for your Medicare Wellness Visit. I appreciate your ongoing commitment to your health goals. Please review the following plan we discussed and let me know if I can assist you in the future.   Follow up with Dr. Derrel Nip as needed.    Bring a copy of your [place] and/or Living Will to be scanned into chart.  Have a great day!  These are the goals we discussed: Goals    . Healthy Lifestyle          Stay active and continue chair/standing exercises.  Increase walking as tolerated. Stay hydrated  Low carb foods.  Lean meats, vegetables.       This is a list of the screening recommended for you and due dates:  Health Maintenance  Topic Date Due  . Flu Shot  01/01/2017  . Tetanus Vaccine  10/07/2023  . DEXA scan (bone density measurement)  Completed  . Pneumonia vaccines  Completed

## 2016-09-05 NOTE — Progress Notes (Signed)
  I have reviewed the above information and agree with above.   Teresa Tullo, MD 

## 2016-09-16 ENCOUNTER — Other Ambulatory Visit: Payer: Self-pay | Admitting: Internal Medicine

## 2016-09-26 ENCOUNTER — Ambulatory Visit (INDEPENDENT_AMBULATORY_CARE_PROVIDER_SITE_OTHER): Payer: Medicare HMO | Admitting: Cardiovascular Disease

## 2016-09-26 ENCOUNTER — Encounter: Payer: Self-pay | Admitting: Cardiovascular Disease

## 2016-09-26 VITALS — BP 142/62 | HR 62 | Ht 61.0 in | Wt 170.0 lb

## 2016-09-26 DIAGNOSIS — I2510 Atherosclerotic heart disease of native coronary artery without angina pectoris: Secondary | ICD-10-CM | POA: Diagnosis not present

## 2016-09-26 DIAGNOSIS — I10 Essential (primary) hypertension: Secondary | ICD-10-CM | POA: Diagnosis not present

## 2016-09-26 DIAGNOSIS — E7800 Pure hypercholesterolemia, unspecified: Secondary | ICD-10-CM | POA: Diagnosis not present

## 2016-09-26 NOTE — Patient Instructions (Signed)
Medication Instructions: Continue same medications.   Labwork: None.   Procedures/Testing: None.   Follow-Up: 6 months with Dr. Antionette Luster.   Any Additional Special Instructions Will Be Listed Below (If Applicable).     If you need a refill on your cardiac medications before your next appointment, please call your pharmacy.   

## 2016-09-26 NOTE — Progress Notes (Signed)
Cardiology Office Note   Date:  09/26/2016   ID:  Jenna Marshall, DOB [birth year], MRN [id number]  PCP:  Jenna Mc, MD  Cardiologist:   Jenna Sacramento, MD   Chief Complaint  Patient presents with  . other    6 month follow up. Patient states she is doing well. Meds reviewed verbally with patient.       History of Present Illness: Jenna Marshall is a 83 y.o. female who presents for a follow up visit regarding CAD. She is s/p 4 vessel CABG on 10/14/2014 for severe LM stenosis, diastolic dysfunction, hypertension, and hyperlipidemia . She had a left hip fracture in August, 2016 after a mechanical fall which required surgery.  She has been doing well and denies any chest pain, Shortness of breath, palpitations or dizziness. She walks with a walker and occasionally a cane.    Past Medical History:  Diagnosis Date  . Arthritis   . CAD (coronary artery disease)    a. Lexiscan 10/13/14: mid anterior to apical & inf wall ischemia w/ WMA, mild to mod dep EF; b. cardiac cath 10/14/2014: ost LM to LM 50%, LM 99%, ost LAD 95%, ost LAD 95%, prox LAD to mid LAD 40%, prox RCA 30%, mid RCA 70%, Critical left main stenosis. Significant distal RCA stenosis. Heavily calcified arteries. Normal EF by echo with no significant AS or MR. Recommend urgent CABG. cCABG x 4 (5/201  . CAD (coronary artery disease)    a. Lexi 10/13/14: mid ant to apical & inf wall ischemia w/ WMA, mild-mod dep EF; b. cath 10/14/14: ost LM to LM 50%, dLM 99%, ost LAD 95%, ost LAD 95%, pLAD-LAD 40%, pRCA 30%, mRCA 70%, Critical LM stenosis. Sig dRCA stenosis. Heavily calcified arteries; c. CABG x 4 (10/2014)  . Depression   . Diastolic dysfunction    a. echo 10/2014: EF 55-60%, no RWMA, GR1DD, mild AI, trivial MR, mildly dilated LA, PASP normal  . GERD (gastroesophageal reflux disease)   . Hyperlipemia   . Hyperlipidemia   . Hypertension   . Hypothyroidism   . Macular degeneration   . pernicious anemia   . Varicose veins     . Wears dentures    partial upper  . Wears hearing aid    right    Past Surgical History:  Procedure Laterality Date  . ANTERIOR VITRECTOMY Right 07/16/2016   Procedure: ANTERIOR VITRECTOMY;  Surgeon: Jenna Bear, MD;  Location: [place];  Service: Ophthalmology;  Laterality: Right;  . CARDIAC CATHETERIZATION Left 10/14/2014   Procedure: Left Heart Cath and Coronary Angiography;  Surgeon: Jenna Hampshire, MD;  Location: [place] [facility];  Service: Cardiovascular;  Laterality: Left;  . CATARACT EXTRACTION W/PHACO Left 04/30/2016   Procedure: CATARACT EXTRACTION PHACO AND INTRAOCULAR LENS PLACEMENT (IOC);  Surgeon: Jenna Bear, MD;  Location: [place];  Service: Ophthalmology;  Laterality: Left;  LEFT  . CATARACT EXTRACTION W/PHACO Right 07/16/2016   Procedure: CATARACT EXTRACTION PHACO AND INTRAOCULAR LENS PLACEMENT ([place])  Right;  Surgeon: Jenna Bear, MD;  Location: [place];  Service: Ophthalmology;  Laterality: Right;  . CORONARY ARTERY BYPASS GRAFT N/A 10/14/2014   Procedure: CORONARY ARTERY BYPASS GRAFTING (CABG), ON PUMP, TIMES FOUR, USING LEFT INTERNAL MAMMARY ARTERY, RIGHT GREATER SAPHENOUS VEIN HARVESTED ENDOSCOPICALLY;  Surgeon: Jenna Pollack, MD;  Location: [place];  Service: Open Heart Surgery;  Laterality: N/A;  . CORONARY ARTERY BYPASS GRAFT  10/2014   LIMA-->LAD,  SVG-->RCA, sequential SVG-->Ramus and OM  . HEMORRHOID SURGERY    . HIP ARTHROPLASTY Left 01/14/2015   Procedure: ARTHROPLASTY BIPOLAR HIP (HEMIARTHROPLASTY);  Surgeon: Jenna Leep, MD;  Location: [facility];  Service: Orthopedics;  Laterality: Left;  Jenna Marshall VARICOSE VEIN SURGERY       Current Outpatient Prescriptions  Medication Sig Dispense Refill  . acetaminophen (TYLENOL) 325 MG tablet Take 2 tablets (650 mg total) by mouth every 6 (six) hours as needed for mild pain.    Jenna Marshall aspirin 81 MG chewable tablet Chew 1 tablet (81 mg total) by mouth daily. 30 tablet 0  .  atorvastatin (LIPITOR) 40 MG tablet TAKE ONE TABLET BY MOUTH EVERY DAY 30 tablet 3  . Calcium Carbonate-Vitamin D (CALCIUM + D) 600-200 MG-UNIT TABS Take 1 tablet by mouth 2 (two) times daily.     . carvedilol (COREG) 3.125 MG tablet TAKE ONE TABLET BY MOUTH TWICE DAILY WITH MEALS 60 tablet 5  . Cholecalciferol (D3 DOTS) 2000 UNITS TBDP Take 2,000 Units by mouth.    . levothyroxine (SYNTHROID, LEVOTHROID) 25 MCG tablet TAKE ONE TABLET BY MOUTH EVERY MORNING BEFORE BREAKFAST FOR THYROID 30 tablet 1  . meloxicam (MOBIC) 15 MG tablet Take 1 tablet (15 mg total) by mouth daily as needed. 30 tablet 5  . Multiple Vitamin (MULTIVITAMIN WITH MINERALS) TABS tablet Take 1 tablet by mouth daily.    . Multiple Vitamins-Minerals (PRESERVISION AREDS 2) CAPS Take 1 capsule by mouth 2 (two) times daily.     . pantoprazole (PROTONIX) 40 MG tablet TAKE ONE TABLET BY MOUTH EVERY DAY 30 tablet 5  . senna-docusate (SENOKOT-S) 8.6-50 MG per tablet Take 1 tablet by mouth 2 (two) times daily. 30 tablet 0  . torsemide (DEMADEX) 10 MG tablet As needed for overnight weight gain of 2 lbs 30 tablet 3  . torsemide (DEMADEX) 10 MG tablet Take 1 tablet (10 mg total) by mouth daily. As needed for weight gain over 2 pounds. 30 tablet 3   No current facility-administered medications for this visit.     Allergies:   Ambien [zolpidem]; Codeine; and Lasix [furosemide]    Social History:  The patient  reports that she has never smoked. She has never used smokeless tobacco. She reports that she does not drink alcohol or use drugs.   Family History:  The patient's family history includes Coronary artery disease in her brother and mother; Diabetes in her mother; Heart disease in her father.    ROS:  Please see the history of present illness.   Otherwise, review of systems are positive for none.   All other systems are reviewed and negative.    PHYSICAL EXAM: VS:  BP (!) 142/62 (BP Location: Left Arm, Patient Position: Sitting,  Cuff Size: Normal)   Pulse 62   Ht 5\' 1"  (1.549 m)   Wt 170 lb (77.1 kg)   BMI 32.12 kg/m  , BMI Body mass index is 32.12 kg/m. GEN: Well nourished, well developed, in no acute distress  HEENT: normal  Neck: no JVD, carotid bruits, or masses Cardiac: RRR; no  rubs, or gallops,no edema . 2/6 systolic ejection murmur in the aortic area which is early peaking. Respiratory:  clear to auscultation bilaterally, normal work of breathing GI: soft, nontender, nondistended, + BS MS: no deformity or atrophy  Skin: warm and dry, no rash Neuro:  Strength and sensation are intact Psych: euthymic mood, full affect   EKG:  EKG is ordered today. The ekg ordered today  demonstrates normal sinus rhythm with sinus arrhythmia.   Recent Labs: 05/08/2016: ALT 9; BUN 25; Creatinine, Ser 0.94; Potassium 4.6; Sodium 136; TSH 4.03 07/24/2016: Hemoglobin 12.5; Platelets 151.0    Lipid Panel    Component Value Date/Time   CHOL 139 11/01/2015 0950   CHOL 114 12/23/2014 0826   TRIG 95.0 11/01/2015 0950   HDL 44.40 11/01/2015 0950   HDL 40 12/23/2014 0826   CHOLHDL 3 11/01/2015 0950   VLDL 19.0 11/01/2015 0950   LDLCALC 76 11/01/2015 0950   LDLCALC 56 12/23/2014 0826   LDLDIRECT 104.4 02/27/2011 1135      Wt Readings from Last 3 Encounters:  09/26/16 170 lb (77.1 kg)  09/04/16 175 lb 12.8 oz (79.7 kg)  07/16/16 169 lb (76.7 kg)        ASSESSMENT AND PLAN:  1.  Coronary artery disease involving native coronary arteries without angina: She is doing extremely well with no anginal symptoms. EKG is unremarkable. Continue medical therapy.  2. Essential hypertension: Blood pressure is Mildly elevated but her blood pressure is reasonably controlled. I made no changes today.  3. Hyperlipidemia: Continue treatment with atorvastatin. I reviewed most recent labs which showed an LDL of 76.  4. Cardiac murmur suggestive of aortic sclerosis or mild stenosis: The plan is to request an echocardiogram in  about 6 months.    Disposition:   FU with me in 6 months  Signed,  Jenna Sacramento, MD  09/26/2016 3:21 PM    [place]

## 2016-10-24 ENCOUNTER — Other Ambulatory Visit: Payer: Self-pay

## 2016-10-24 MED ORDER — LEVOTHYROXINE SODIUM 25 MCG PO TABS: 25.0000 ug | ORAL_TABLET | Freq: Every day | ORAL | 0 refills | Status: DC

## 2016-10-24 NOTE — Telephone Encounter (Signed)
Refilled: 09/18/2079 Last OV: 05/08/2016 Next OV: 11/07/2016 Last TSH: 05/08/2016

## 2016-10-24 NOTE — Telephone Encounter (Signed)
REFILLED FOR 30 DAYS WILL CHECK TSH AT  VISIT

## 2016-11-07 ENCOUNTER — Ambulatory Visit (INDEPENDENT_AMBULATORY_CARE_PROVIDER_SITE_OTHER): Payer: Medicare HMO | Admitting: Internal Medicine

## 2016-11-07 ENCOUNTER — Encounter: Payer: Self-pay | Admitting: Internal Medicine

## 2016-11-07 VITALS — BP 150/80 | HR 63 | Temp 97.8°F | Resp 16 | Ht 61.0 in | Wt 172.2 lb

## 2016-11-07 DIAGNOSIS — E7800 Pure hypercholesterolemia, unspecified: Secondary | ICD-10-CM

## 2016-11-07 DIAGNOSIS — I10 Essential (primary) hypertension: Secondary | ICD-10-CM | POA: Diagnosis not present

## 2016-11-07 DIAGNOSIS — E034 Atrophy of thyroid (acquired): Secondary | ICD-10-CM

## 2016-11-07 DIAGNOSIS — D500 Iron deficiency anemia secondary to blood loss (chronic): Secondary | ICD-10-CM

## 2016-11-07 LAB — IBC PANEL
Iron: 81 ug/dL (ref 42–145)
SATURATION RATIOS: 26.5 % (ref 20.0–50.0)
TRANSFERRIN: 218 mg/dL (ref 212.0–360.0)

## 2016-11-07 LAB — COMPREHENSIVE METABOLIC PANEL
ALT: 11 U/L (ref 0–35)
AST: 17 U/L (ref 0–37)
Albumin: 4.4 g/dL (ref 3.5–5.2)
Alkaline Phosphatase: 53 U/L (ref 39–117)
BILIRUBIN TOTAL: 0.5 mg/dL (ref 0.2–1.2)
BUN: 33 mg/dL — AB (ref 6–23)
CO2: 28 mEq/L (ref 19–32)
Calcium: 9.5 mg/dL (ref 8.4–10.5)
Chloride: 102 mEq/L (ref 96–112)
Creatinine, Ser: 1.02 mg/dL (ref 0.40–1.20)
GFR: 54.25 mL/min — AB (ref >60.00)
GLUCOSE: 103 mg/dL — AB (ref 70–99)
Potassium: 4.9 mEq/L (ref 3.5–5.1)
SODIUM: 139 meq/L (ref 135–145)
TOTAL PROTEIN: 6.8 g/dL (ref 6.0–8.3)

## 2016-11-07 LAB — LIPID PANEL
CHOL/HDL RATIO: 3
Cholesterol: 144 mg/dL (ref 0–200)
HDL: 52.2 mg/dL (ref >39.00)
LDL Cholesterol: 73 mg/dL (ref 0–99)
NonHDL: 91.94
TRIGLYCERIDES: 96 mg/dL (ref 0.0–149.0)
VLDL: 19.2 mg/dL (ref 0.0–40.0)

## 2016-11-07 LAB — FERRITIN: Ferritin: 9.7 ng/mL — ABNORMAL LOW (ref 10.0–291.0)

## 2016-11-07 LAB — TSH: TSH: 3 u[IU]/mL (ref 0.35–4.50)

## 2016-11-07 NOTE — Progress Notes (Signed)
Subjective:  Patient ID: Jenna Marshall, female    DOB: [birth year]  Age: 83 y.o. MRN: [id number]  CC: The primary encounter diagnosis was Hypothyroidism due to acquired atrophy of thyroid. Diagnoses of Pure hypercholesterolemia, Essential hypertension, and Iron deficiency anemia due to chronic blood loss were also pertinent to this visit.  HPI  No ch Ty Oshima presents for  Follow up on chronic conditions including iron deficiency anemia, CAD. Hypertension and hyperlipidemia. Patient is taking her medications as prescribed and notes no adverse effects.  Home BP readings have been done about once per week and are  generally > 140/80 .  She is avoiding added salt in her diet and walking regularly about 3 times per week for exercise  .   No longer taking iron supplements, per instructions at last visit,  Constipation improved. No recent falls. Denies chest pain    Outpatient Medications Prior to Visit  Medication Sig Dispense Refill  . acetaminophen (TYLENOL) 325 MG tablet Take 2 tablets (650 mg total) by mouth every 6 (six) hours as needed for mild pain.    Marland Kitchen aspirin 81 MG chewable tablet Chew 1 tablet (81 mg total) by mouth daily. 30 tablet 0  . atorvastatin (LIPITOR) 40 MG tablet TAKE ONE TABLET BY MOUTH EVERY DAY 30 tablet 3  . Calcium Carbonate-Vitamin D (CALCIUM + D) 600-200 MG-UNIT TABS Take 1 tablet by mouth 2 (two) times daily.     . carvedilol (COREG) 3.125 MG tablet TAKE ONE TABLET BY MOUTH TWICE DAILY WITH MEALS 60 tablet 5  . Cholecalciferol (D3 DOTS) 2000 UNITS TBDP Take 2,000 Units by mouth.    . levothyroxine (SYNTHROID, LEVOTHROID) 25 MCG tablet Take 1 tablet (25 mcg total) by mouth daily before breakfast. 30 tablet 0  . meloxicam (MOBIC) 15 MG tablet Take 1 tablet (15 mg total) by mouth daily as needed. 30 tablet 5  . Multiple Vitamin (MULTIVITAMIN WITH MINERALS) TABS tablet Take 1 tablet by mouth daily.    . Multiple Vitamins-Minerals (PRESERVISION AREDS 2) CAPS Take  1 capsule by mouth 2 (two) times daily.     . pantoprazole (PROTONIX) 40 MG tablet TAKE ONE TABLET BY MOUTH EVERY DAY 30 tablet 5  . senna-docusate (SENOKOT-S) 8.6-50 MG per tablet Take 1 tablet by mouth 2 (two) times daily. 30 tablet 0  . torsemide (DEMADEX) 10 MG tablet As needed for overnight weight gain of 2 lbs 30 tablet 3  . torsemide (DEMADEX) 10 MG tablet Take 1 tablet (10 mg total) by mouth daily. As needed for weight gain over 2 pounds. (Patient not taking: Reported on 11/07/2016) 30 tablet 3   No facility-administered medications prior to visit.     Review of Systems;  Patient denies headache, fevers, malaise, unintentional weight loss, skin rash, eye pain, sinus congestion and sinus pain, sore throat, dysphagia,  hemoptysis , cough, dyspnea, wheezing, chest pain, palpitations, orthopnea, edema, abdominal pain, nausea, melena, diarrhea, constipation, flank pain, dysuria, hematuria, urinary  Frequency, nocturia, numbness, tingling, seizures,  Focal weakness, Loss of consciousness,  Tremor, insomnia, depression, anxiety, and suicidal ideation.      Objective:  BP (!) 150/80 (BP Location: Left Arm, Patient Position: Sitting, Cuff Size: Normal)   Pulse 63   Temp 97.8 F (36.6 C) (Oral)   Resp 16   Ht 5\' 1"  (1.549 m)   Wt 172 lb 3.2 oz (78.1 kg)   SpO2 95%   BMI 32.54 kg/m   BP Readings from Last 3 Encounters:  11/07/16 (!) 150/80  09/26/16 (!) 142/62  09/04/16 (!) 142/62    Wt Readings from Last 3 Encounters:  11/07/16 172 lb 3.2 oz (78.1 kg)  09/26/16 170 lb (77.1 kg)  09/04/16 175 lb 12.8 oz (79.7 kg)    General appearance: alert, cooperative and appears stated age Ears: normal TM's and external ear canals both ears Throat: lips, mucosa, and tongue normal; teeth and gums normal Neck: no adenopathy, no carotid bruit, supple, symmetrical, trachea midline and thyroid not enlarged, symmetric, no tenderness/mass/nodules Back: symmetric, no curvature. ROM normal. No CVA  tenderness. Lungs: clear to auscultation bilaterally Heart: regular rate and rhythm, S1, S2 normal, no murmur, click, rub or gallop Abdomen: soft, non-tender; bowel sounds normal; no masses,  no organomegaly Pulses: 2+ and symmetric Skin: Skin color, texture, turgor normal. No rashes or lesions Lymph nodes: Cervical, supraclavicular, and axillary nodes normal.  No results found for: HGBA1C  Lab Results  Component Value Date   CREATININE 1.02 11/07/2016   CREATININE 0.94 05/08/2016   CREATININE 0.87 11/01/2015    Lab Results  Component Value Date   WBC 7.7 07/24/2016   HGB 12.5 07/24/2016   HCT 37.4 07/24/2016   PLT 151.0 07/24/2016   GLUCOSE 103 (H) 11/07/2016   CHOL 144 11/07/2016   TRIG 96.0 11/07/2016   HDL 52.20 11/07/2016   LDLDIRECT 104.4 02/27/2011   LDLCALC 73 11/07/2016   ALT 11 11/07/2016   AST 17 11/07/2016   NA 139 11/07/2016   K 4.9 11/07/2016   CL 102 11/07/2016   CREATININE 1.02 11/07/2016   BUN 33 (H) 11/07/2016   CO2 28 11/07/2016   TSH 3.00 11/07/2016   INR 1.64 (H) 10/14/2014    Mm Digital Screening Bilateral  Result Date: 08/28/2016 CLINICAL DATA:  Screening. EXAM: DIGITAL SCREENING BILATERAL MAMMOGRAM WITH CAD COMPARISON:  Previous exam(s). ACR Breast Density Category b: There are scattered areas of fibroglandular density. FINDINGS: There are no findings suspicious for malignancy. Images were processed with CAD. IMPRESSION: No mammographic evidence of malignancy. A result letter of this screening mammogram will be mailed directly to the patient. RECOMMENDATION: Screening mammogram in one year. (Code:SM-B-01Y) BI-RADS CATEGORY  1: Negative. Electronically Signed   By: Pamelia Hoit M.D.   On: 08/28/2016 17:50    Assessment & Plan:   Problem List Items Addressed This Visit    Iron deficiency anemia    Occurred post operatively from hip surgery. Remains resolved with iron supplementation stopped at last visit.  Lab Results  Component Value Date    WBC 7.7 07/24/2016   HGB 12.5 07/24/2016   HCT 37.4 07/24/2016   MCV 92.0 07/24/2016   PLT 151.0 07/24/2016         Relevant Orders   Ferritin (Completed)   IBC panel (Completed)   Hypothyroidism - Primary (Chronic)    Thyroid function is WNL on current dose.  No current changes needed.   Lab Results  Component Value Date   TSH 3.00 11/07/2016         Relevant Orders   TSH (Completed)   Hyperlipidemia (Chronic)    LDL and triglycerides are at goal on current medications. She has no side effects and liver enzymes are normal. No changes today   Lab Results  Component Value Date   CHOL 144 11/07/2016   HDL 52.20 11/07/2016   LDLCALC 73 11/07/2016   LDLDIRECT 104.4 02/27/2011   TRIG 96.0 11/07/2016   CHOLHDL 3 11/07/2016  Relevant Orders   Lipid panel (Completed)   Essential hypertension (Chronic)    elevation noted today.  Patient has been asked to check bp at home and submit readings in 2 weeks.       Relevant Orders   Comprehensive metabolic panel (Completed)    A total of 25 minutes of face to face time was spent with patient more than half of which was spent in counselling about the above mentioned conditions  and coordination of care   I am having Ms. Schrader maintain her Calcium Carbonate-Vitamin D, multivitamin with minerals, acetaminophen, PRESERVISION AREDS 2, aspirin, senna-docusate, Cholecalciferol, torsemide, meloxicam, carvedilol, atorvastatin, pantoprazole, and levothyroxine.  No orders of the defined types were placed in this encounter.   Medications Discontinued During This Encounter  Medication Reason  . torsemide (DEMADEX) 10 MG tablet Duplicate    Follow-up: No Follow-up on file.   [name], MD

## 2016-11-07 NOTE — Patient Instructions (Signed)
You are doing well!   your blood pressure is consistently above the currently recommended acceptable standards of 130/80,  So please check your blood pressure a few times at home and send me the readings so I can determine if you need a change in medication   I'll see you in 6 months   The ShingRx vaccine is  Available at local pharmacies and IS ADVISED for all interested adults over 50 to prevent shingles

## 2016-11-09 NOTE — Assessment & Plan Note (Signed)
elevation noted today.  Patient has been asked to check bp at home and submit readings in 2 weeks.  

## 2016-11-09 NOTE — Assessment & Plan Note (Signed)
Occurred post operatively from hip surgery. Remains resolved with iron supplementation stopped at last visit.  Lab Results  Component Value Date   WBC 7.7 07/24/2016   HGB 12.5 07/24/2016   HCT 37.4 07/24/2016   MCV 92.0 07/24/2016   PLT 151.0 07/24/2016

## 2016-11-09 NOTE — Assessment & Plan Note (Signed)
LDL and triglycerides are at goal on current medications. She has no side effects and liver enzymes are normal. No changes today   Lab Results  Component Value Date   CHOL 144 11/07/2016   HDL 52.20 11/07/2016   LDLCALC 73 11/07/2016   LDLDIRECT 104.4 02/27/2011   TRIG 96.0 11/07/2016   CHOLHDL 3 11/07/2016

## 2016-11-09 NOTE — Assessment & Plan Note (Signed)
Thyroid function is WNL on current dose.  No current changes needed.   Lab Results  Component Value Date   TSH 3.00 11/07/2016

## 2016-11-11 ENCOUNTER — Other Ambulatory Visit: Payer: Self-pay | Admitting: Internal Medicine

## 2016-12-16 ENCOUNTER — Other Ambulatory Visit: Payer: Self-pay | Admitting: Internal Medicine

## 2016-12-22 ENCOUNTER — Other Ambulatory Visit: Payer: Self-pay | Admitting: Cardiovascular Disease

## 2017-01-04 ENCOUNTER — Other Ambulatory Visit: Payer: Self-pay | Admitting: Internal Medicine

## 2017-03-07 ENCOUNTER — Encounter: Payer: Self-pay | Admitting: Cardiovascular Disease

## 2017-03-07 ENCOUNTER — Ambulatory Visit (INDEPENDENT_AMBULATORY_CARE_PROVIDER_SITE_OTHER): Payer: Medicare HMO | Admitting: Cardiovascular Disease

## 2017-03-07 VITALS — BP 130/70 | HR 70 | Ht 63.0 in | Wt 175.2 lb

## 2017-03-07 DIAGNOSIS — I2510 Atherosclerotic heart disease of native coronary artery without angina pectoris: Secondary | ICD-10-CM

## 2017-03-07 DIAGNOSIS — E7800 Pure hypercholesterolemia, unspecified: Secondary | ICD-10-CM | POA: Diagnosis not present

## 2017-03-07 DIAGNOSIS — R011 Cardiac murmur, unspecified: Secondary | ICD-10-CM

## 2017-03-07 DIAGNOSIS — I10 Essential (primary) hypertension: Secondary | ICD-10-CM | POA: Diagnosis not present

## 2017-03-07 NOTE — Progress Notes (Signed)
Cardiology Office Note   Date:  03/07/2017   ID:  Jenna Marshall, DOB 1925/11/01, MRN [id number]  PCP:  [name], MD  Cardiologist:   [name], MD   Chief Complaint  Patient presents with  . other    6 month follow up. Meds reviewed by the pt. verbally. "doing well."       History of Present Illness: Jenna Marshall is a 83 y.o. female who presents for a follow up visit regarding CAD. She is s/p 4 vessel CABG on 10/14/2014 for severe LM stenosis, diastolic dysfunction, hypertension, and hyperlipidemia . She had a left hip fracture in August, 2016 after a mechanical fall which required surgery.   She has been doing very well with no chest pain, shortness of breath or palpitations. She is relatively independent and walks with a cane.  Past Medical History:  Diagnosis Date  . Arthritis   . CAD (coronary artery disease)    a. Lexiscan 10/13/14: mid anterior to apical & inf wall ischemia w/ WMA, mild to mod dep EF; b. cardiac cath 10/14/2014: ost LM to LM 50%, LM 99%, ost LAD 95%, ost LAD 95%, prox LAD to mid LAD 40%, prox RCA 30%, mid RCA 70%, Critical left main stenosis. Significant distal RCA stenosis. Heavily calcified arteries. Normal EF by echo with no significant AS or MR. Recommend urgent CABG. cCABG x 4 (5/201  . CAD (coronary artery disease)    a. Lexi 10/13/14: mid ant to apical & inf wall ischemia w/ WMA, mild-mod dep EF; b. cath 10/14/14: ost LM to LM 50%, dLM 99%, ost LAD 95%, ost LAD 95%, pLAD-LAD 40%, pRCA 30%, mRCA 70%, Critical LM stenosis. Sig dRCA stenosis. Heavily calcified arteries; c. CABG x 4 (10/2014)  . Depression   . Diastolic dysfunction    a. echo 10/2014: EF 55-60%, no RWMA, GR1DD, mild AI, trivial MR, mildly dilated LA, PASP normal  . GERD (gastroesophageal reflux disease)   . Hyperlipemia   . Hyperlipidemia   . Hypertension   . Hypothyroidism   . Macular degeneration   . pernicious anemia   . Varicose veins   . Wears dentures    partial  upper  . Wears hearing aid    right    Past Surgical History:  Procedure Laterality Date  . ANTERIOR VITRECTOMY Right 07/16/2016   Procedure: ANTERIOR VITRECTOMY;  Surgeon: Eulogio Bear, MD;  Location: [facility];  Service: Ophthalmology;  Laterality: Right;  . CARDIAC CATHETERIZATION Left 10/14/2014   Procedure: Left Heart Cath and Coronary Angiography;  Surgeon: Wellington Hampshire, MD;  Location: [facility];  Service: Cardiovascular;  Laterality: Left;  . CATARACT EXTRACTION W/PHACO Left 04/30/2016   Procedure: CATARACT EXTRACTION PHACO AND INTRAOCULAR LENS PLACEMENT (IOC);  Surgeon: Eulogio Bear, MD;  Location: [facility];  Service: Ophthalmology;  Laterality: Left;  LEFT  . CATARACT EXTRACTION W/PHACO Right 07/16/2016   Procedure: CATARACT EXTRACTION PHACO AND INTRAOCULAR LENS PLACEMENT ([place])  Right;  Surgeon: Eulogio Bear, MD;  Location: [place];  Service: Ophthalmology;  Laterality: Right;  . CORONARY ARTERY BYPASS GRAFT N/A 10/14/2014   Procedure: CORONARY ARTERY BYPASS GRAFTING (CABG), ON PUMP, TIMES FOUR, USING LEFT INTERNAL MAMMARY ARTERY, RIGHT GREATER SAPHENOUS VEIN HARVESTED ENDOSCOPICALLY;  Surgeon: Gaye Pollack, MD;  Location: [facility];  Service: Open Heart Surgery;  Laterality: N/A;  . CORONARY ARTERY BYPASS GRAFT  10/2014   LIMA-->LAD, SVG-->RCA, sequential SVG-->[place] and OM  .  HEMORRHOID SURGERY    . HIP ARTHROPLASTY Left 01/14/2015   Procedure: ARTHROPLASTY BIPOLAR HIP (HEMIARTHROPLASTY);  Surgeon: Dereck Leep, MD;  Location: [facility];  Service: Orthopedics;  Laterality: Left;  Marland Kitchen VARICOSE VEIN SURGERY       Current Outpatient Prescriptions  Medication Sig Dispense Refill  . acetaminophen (TYLENOL) 325 MG tablet Take 2 tablets (650 mg total) by mouth every 6 (six) hours as needed for mild pain.    Marland Kitchen aspirin 81 MG chewable tablet Chew 1 tablet (81 mg total) by mouth daily. 30 tablet 0  . atorvastatin (LIPITOR) 40 MG  tablet TAKE ONE TABLET BY MOUTH EVERY DAY 30 tablet 3  . Calcium Carbonate-Vitamin D (CALCIUM + D) 600-200 MG-UNIT TABS Take 1 tablet by mouth 2 (two) times daily.     . carvedilol (COREG) 3.125 MG tablet TAKE ONE TABLET BY MOUTH TWICE DAILY WITH MEALS 60 tablet 2  . Cholecalciferol (D3 DOTS) 2000 UNITS TBDP Take 2,000 Units by mouth.    . levothyroxine (SYNTHROID, LEVOTHROID) 25 MCG tablet TAKE ONE TABLET BY MOUTH EVERY MORNING BEFORE BREAKFAST 30 tablet 5  . meloxicam (MOBIC) 15 MG tablet TAKE ONE TABLET BY MOUTH EVERY DAY AS NEEDED 30 tablet 2  . Multiple Vitamin (MULTIVITAMIN WITH MINERALS) TABS tablet Take 1 tablet by mouth daily.    . Multiple Vitamins-Minerals (PRESERVISION AREDS 2) CAPS Take 1 capsule by mouth 2 (two) times daily.     . pantoprazole (PROTONIX) 40 MG tablet TAKE ONE TABLET BY MOUTH EVERY DAY 30 tablet 5  . senna-docusate (SENOKOT-S) 8.6-50 MG per tablet Take 1 tablet by mouth 2 (two) times daily. 30 tablet 0  . torsemide (DEMADEX) 10 MG tablet As needed for overnight weight gain of 2 lbs 30 tablet 3   No current facility-administered medications for this visit.     Allergies:   Ambien [zolpidem]; Codeine; and Lasix [furosemide]    Social History:  The patient  reports that she has never smoked. She has never used smokeless tobacco. She reports that she does not drink alcohol or use drugs.   Family History:  The patient's family history includes Coronary artery disease in her brother and mother; Diabetes in her mother; Heart disease in her father.    ROS:  Please see the history of present illness.   Otherwise, review of systems are positive for none.   All other systems are reviewed and negative.    PHYSICAL EXAM: VS:  BP 130/70 (BP Location: Left Arm, Patient Position: Sitting, Cuff Size: Normal)   Pulse 70   Ht 5\' 3"  (1.6 m)   Wt 175 lb 4 oz (79.5 kg)   BMI 31.04 kg/m  , BMI Body mass index is 31.04 kg/m. GEN: Well nourished, well developed, in no acute  distress  HEENT: normal  Neck: no JVD, carotid bruits, or masses Cardiac: RRR; no  rubs, or gallops,no edema . 2/6 systolic ejection murmur in the aortic area which is mid peaking. Respiratory:  clear to auscultation bilaterally, normal work of breathing GI: soft, nontender, nondistended, + BS MS: no deformity or atrophy  Skin: warm and dry, no rash Neuro:  Strength and sensation are intact Psych: euthymic mood, full affect   EKG:  EKG is ordered today. The ekg ordered today demonstrates normal sinus rhythm with old septal infarct   Recent Labs: 07/24/2016: Hemoglobin 12.5; Platelets 151.0 11/07/2016: ALT 11; BUN 33; Creatinine, Ser 1.02; Potassium 4.9; Sodium 139; TSH 3.00    Lipid Panel  Component Value Date/Time   CHOL 144 11/07/2016 1055   CHOL 114 12/23/2014 0826   TRIG 96.0 11/07/2016 1055   HDL 52.20 11/07/2016 1055   HDL 40 12/23/2014 0826   CHOLHDL 3 11/07/2016 1055   VLDL 19.2 11/07/2016 1055   LDLCALC 73 11/07/2016 1055   LDLCALC 56 12/23/2014 0826   LDLDIRECT 104.4 02/27/2011 1135      Wt Readings from Last 3 Encounters:  03/07/17 175 lb 4 oz (79.5 kg)  11/07/16 172 lb 3.2 oz (78.1 kg)  09/26/16 170 lb (77.1 kg)        ASSESSMENT AND PLAN:  1.  Coronary artery disease involving native coronary arteries without angina: She is doing extremely well with no anginal symptoms. EKG is unremarkable. Continue medical therapy.  2. Essential hypertension: Blood pressure is well controlled on current medications  3. Hyperlipidemia: Continue treatment with atorvastatin. I reviewed most recent labs which showed an LDL of 73.  4. Cardiac murmur suggestive of aortic stenosis of at least mild severity. I requested an echocardiogram for evaluation.    Disposition:   FU with me in 6 months  Signed,  [name], MD  03/07/2017 11:06 AM    [place]

## 2017-03-07 NOTE — Patient Instructions (Addendum)
Medication Instructions:  Your physician recommends that you continue on your current medications as directed. Please refer to the Current Medication list given to you today.   Labwork: none  Testing/Procedures: Your physician has requested that you have an echocardiogram. Echocardiography is a painless test that uses sound waves to create images of your heart. It provides your doctor with information about the size and shape of your heart and how well your heart's chambers and valves are working. This procedure takes approximately one hour. There are no restrictions for this procedure.    Follow-Up: Your physician wants you to follow-up in: 6 months with Dr. Arida.  You will receive a reminder letter in the mail two months in advance. If you don't receive a letter, please call our office to schedule the follow-up appointment.   Any Other Special Instructions Will Be Listed Below (If Applicable).     If you need a refill on your cardiac medications before your next appointment, please call your pharmacy.  Echocardiogram An echocardiogram, or echocardiography, uses sound waves (ultrasound) to produce an image of your heart. The echocardiogram is simple, painless, obtained within a short period of time, and offers valuable information to your health care provider. The images from an echocardiogram can provide information such as:  Evidence of coronary artery disease (CAD).  Heart size.  Heart muscle function.  Heart valve function.  Aneurysm detection.  Evidence of a past heart attack.  Fluid buildup around the heart.  Heart muscle thickening.  Assess heart valve function.  Tell a health care provider about:  Any allergies you have.  All medicines you are taking, including vitamins, herbs, eye drops, creams, and over-the-counter medicines.  Any problems you or family members have had with anesthetic medicines.  Any blood disorders you have.  Any surgeries you have  had.  Any medical conditions you have.  Whether you are pregnant or may be pregnant. What happens before the procedure? No special preparation is needed. Eat and drink normally. What happens during the procedure?  In order to produce an image of your heart, gel will be applied to your chest and a wand-like tool (transducer) will be moved over your chest. The gel will help transmit the sound waves from the transducer. The sound waves will harmlessly bounce off your heart to allow the heart images to be captured in real-time motion. These images will then be recorded.  You may need an IV to receive a medicine that improves the quality of the pictures. What happens after the procedure? You may return to your normal schedule including diet, activities, and medicines, unless your health care provider tells you otherwise. This information is not intended to replace advice given to you by your health care provider. Make sure you discuss any questions you have with your health care provider. Document Released: 05/17/2000 Document Revised: 01/06/2016 Document Reviewed: 01/25/2013 Elsevier Interactive Patient Education  2017 [employer]  

## 2017-03-13 ENCOUNTER — Other Ambulatory Visit: Payer: Self-pay | Admitting: Internal Medicine

## 2017-03-18 DIAGNOSIS — H353132 Nonexudative age-related macular degeneration, bilateral, intermediate dry stage: Secondary | ICD-10-CM | POA: Diagnosis not present

## 2017-03-19 ENCOUNTER — Ambulatory Visit (INDEPENDENT_AMBULATORY_CARE_PROVIDER_SITE_OTHER): Payer: Medicare HMO

## 2017-03-19 ENCOUNTER — Other Ambulatory Visit: Payer: Self-pay

## 2017-03-19 DIAGNOSIS — R011 Cardiac murmur, unspecified: Secondary | ICD-10-CM

## 2017-03-27 ENCOUNTER — Ambulatory Visit: Payer: Self-pay | Admitting: Cardiovascular Disease

## 2017-03-28 ENCOUNTER — Other Ambulatory Visit: Payer: Self-pay | Admitting: Internal Medicine

## 2017-04-18 ENCOUNTER — Other Ambulatory Visit: Payer: Self-pay | Admitting: Cardiovascular Disease

## 2017-05-12 ENCOUNTER — Ambulatory Visit: Payer: Self-pay | Admitting: Internal Medicine

## 2017-06-01 ENCOUNTER — Other Ambulatory Visit: Payer: Self-pay | Admitting: Internal Medicine

## 2017-06-12 ENCOUNTER — Ambulatory Visit (INDEPENDENT_AMBULATORY_CARE_PROVIDER_SITE_OTHER): Payer: Medicare HMO | Admitting: Internal Medicine

## 2017-06-12 ENCOUNTER — Other Ambulatory Visit: Payer: Self-pay | Admitting: Internal Medicine

## 2017-06-12 ENCOUNTER — Encounter: Payer: Self-pay | Admitting: Internal Medicine

## 2017-06-12 VITALS — BP 120/74 | HR 77 | Temp 98.3°F | Resp 16 | Ht 63.0 in | Wt 174.2 lb

## 2017-06-12 DIAGNOSIS — M8580 Other specified disorders of bone density and structure, unspecified site: Secondary | ICD-10-CM

## 2017-06-12 DIAGNOSIS — Z0001 Encounter for general adult medical examination with abnormal findings: Secondary | ICD-10-CM | POA: Diagnosis not present

## 2017-06-12 DIAGNOSIS — E7800 Pure hypercholesterolemia, unspecified: Secondary | ICD-10-CM

## 2017-06-12 DIAGNOSIS — I5189 Other ill-defined heart diseases: Secondary | ICD-10-CM

## 2017-06-12 DIAGNOSIS — Z1239 Encounter for other screening for malignant neoplasm of breast: Secondary | ICD-10-CM

## 2017-06-12 DIAGNOSIS — K219 Gastro-esophageal reflux disease without esophagitis: Secondary | ICD-10-CM | POA: Diagnosis not present

## 2017-06-12 DIAGNOSIS — D508 Other iron deficiency anemias: Secondary | ICD-10-CM

## 2017-06-12 DIAGNOSIS — Z8781 Personal history of (healed) traumatic fracture: Secondary | ICD-10-CM

## 2017-06-12 DIAGNOSIS — Z0000 Encounter for general adult medical examination without abnormal findings: Secondary | ICD-10-CM

## 2017-06-12 DIAGNOSIS — I2510 Atherosclerotic heart disease of native coronary artery without angina pectoris: Secondary | ICD-10-CM | POA: Diagnosis not present

## 2017-06-12 DIAGNOSIS — E034 Atrophy of thyroid (acquired): Secondary | ICD-10-CM | POA: Diagnosis not present

## 2017-06-12 DIAGNOSIS — Z1231 Encounter for screening mammogram for malignant neoplasm of breast: Secondary | ICD-10-CM | POA: Diagnosis not present

## 2017-06-12 DIAGNOSIS — I519 Heart disease, unspecified: Secondary | ICD-10-CM

## 2017-06-12 DIAGNOSIS — Z79899 Other long term (current) drug therapy: Secondary | ICD-10-CM

## 2017-06-12 LAB — LIPID PANEL
CHOL/HDL RATIO: 3
Cholesterol: 135 mg/dL (ref 0–200)
HDL: 51.5 mg/dL (ref >39.00)
LDL CALC: 66 mg/dL (ref 0–99)
NONHDL: 83.3
Triglycerides: 86 mg/dL (ref 0.0–149.0)
VLDL: 17.2 mg/dL (ref 0.0–40.0)

## 2017-06-12 LAB — COMPREHENSIVE METABOLIC PANEL
ALT: 11 U/L (ref 0–35)
AST: 16 U/L (ref 0–37)
Albumin: 4.2 g/dL (ref 3.5–5.2)
Alkaline Phosphatase: 49 U/L (ref 39–117)
BILIRUBIN TOTAL: 0.4 mg/dL (ref 0.2–1.2)
BUN: 38 mg/dL — ABNORMAL HIGH (ref 6–23)
CALCIUM: 9 mg/dL (ref 8.4–10.5)
CO2: 29 meq/L (ref 19–32)
Chloride: 104 mEq/L (ref 96–112)
Creatinine, Ser: 0.96 mg/dL (ref 0.40–1.20)
GFR: 58.1 mL/min — AB (ref >60.00)
Glucose, Bld: 104 mg/dL — ABNORMAL HIGH (ref 70–99)
Potassium: 4.9 mEq/L (ref 3.5–5.1)
Sodium: 139 mEq/L (ref 135–145)
Total Protein: 7 g/dL (ref 6.0–8.3)

## 2017-06-12 LAB — CBC WITH DIFFERENTIAL/PLATELET
BASOS ABS: 0 10*3/uL (ref 0.0–0.1)
BASOS PCT: 0.6 % (ref 0.0–3.0)
EOS ABS: 0.2 10*3/uL (ref 0.0–0.7)
Eosinophils Relative: 2 % (ref 0.0–5.0)
HCT: 32.1 % — ABNORMAL LOW (ref 36.0–46.0)
Hemoglobin: 10.5 g/dL — ABNORMAL LOW (ref 12.0–15.0)
LYMPHS ABS: 1.7 10*3/uL (ref 0.7–4.0)
Lymphocytes Relative: 21.5 % (ref 12.0–46.0)
MCHC: 32.6 g/dL (ref 30.0–36.0)
MCV: 89.8 fl (ref 78.0–100.0)
Monocytes Absolute: 0.8 10*3/uL (ref 0.1–1.0)
Monocytes Relative: 9.9 % (ref 3.0–12.0)
NEUTROS ABS: 5.3 10*3/uL (ref 1.4–7.7)
NEUTROS PCT: 66 % (ref 43.0–77.0)
PLATELETS: 167 10*3/uL (ref 150.0–400.0)
RBC: 3.57 Mil/uL — ABNORMAL LOW (ref 3.87–5.11)
RDW: 15.9 % — ABNORMAL HIGH (ref 11.5–15.5)
WBC: 8.1 10*3/uL (ref 4.0–10.5)

## 2017-06-12 LAB — TSH: TSH: 3.94 u[IU]/mL (ref 0.35–4.50)

## 2017-06-12 MED ORDER — ZOSTER VAC RECOMB ADJUVANTED 50 MCG/0.5ML IM SUSR: 0.5000 mL | Freq: Once | INTRAMUSCULAR | 1 refills | Status: AC

## 2017-06-12 NOTE — Progress Notes (Signed)
Patient ID: Jenna Marshall, female    DOB: Oct 10, 1925  Age: 84 y.o. MRN: [id number]  The patient is here for annual preventive  examination and management of other chronic and acute problems.   The risk factors are reflected in the social history.  The roster of all physicians providing medical care to patient - is listed in the Snapshot section of the chart.  Activities of daily living:  The patient is 100% independent in all ADLs: dressing, toileting, feeding as well as independent mobility, but she no longer drives,  and uses a walker.   Home safety : The patient has smoke detectors in the home. They wear seatbelts.  There are no firearms at home. There is no violence in the home.   There is no risks for hepatitis, STDs or HIV. There is no   history of blood transfusion. They have no travel history to infectious disease endemic areas of the world.  The patient has seen their dentist in the last six month. They have seen their eye doctor in the last year. They admit to slight hearing difficulty with regard to whispered voices and some television programs and wears a hearing aid in the right ear only.       They do not  have excessive sun exposure. Discussed the need for sun protection: hats, long sleeves and use of sunscreen if there is significant sun exposure.   Diet: the importance of a healthy diet is discussed. They do have a healthy diet.  The benefits of regular aerobic exercise were discussed. She walks 4 times per week ,  15 minutes.   Depression screen: there are no signs or vegative symptoms of depression- irritability, change in appetite, anhedonia, sadness/tearfullness.  Cognitive assessment: the patient manages all their financial and personal affairs and is actively engaged. They could relate day,date,year and events; recalled 2/3 objects at 3 minutes; performed clock-face test normally.  The following portions of the patient's history were reviewed and updated as  appropriate: allergies, current medications, past family history, past medical history,  past surgical history, past social history  and problem list.  Visual acuity was not assessed per patient preference since she has regular follow up with her ophthalmologist. Hearing and body mass index were assessed and reviewed.   During the course of the visit the patient was educated and counseled about appropriate screening and preventive services including : fall prevention , diabetes screening, nutrition counseling, colorectal cancer screening, and recommended immunizations.    CC: The primary encounter diagnosis was Visit for preventive health examination. Diagnoses of Long-term use of high-risk medication, Coronary artery disease involving native coronary artery of native heart without angina pectoris, Pure hypercholesterolemia, Hypothyroidism due to acquired atrophy of thyroid, Other iron deficiency anemia, History of hip fracture, Osteopenia of the elderly, Gastroesophageal reflux disease without esophagitis, Diastolic dysfunction, and Breast cancer screening were also pertinent to this visit.   History Jenna Marshall has a past medical history of Arthritis, CAD (coronary artery disease), CAD (coronary artery disease), Depression, Diastolic dysfunction, GERD (gastroesophageal reflux disease), Hyperlipemia, Hyperlipidemia, Hypertension, Hypothyroidism, Macular degeneration, pernicious anemia, Varicose veins, Wears dentures, and Wears hearing aid.   She has a past surgical history that includes Cardiac catheterization (Left, 10/14/2014); Coronary artery bypass graft (N/A, 10/14/2014); Coronary artery bypass graft (10/2014); Varicose vein surgery; Hemorrhoid surgery; Hip Arthroplasty (Left, 01/14/2015); Cataract extraction w/PHACO (Left, 04/30/2016); Cataract extraction w/PHACO (Right, 07/16/2016); and Anterior vitrectomy (Right, 07/16/2016).   Her family history includes Coronary artery disease in her  brother and mother;  Diabetes in her mother; Heart disease in her father.She reports that  has never smoked. she has never used smokeless tobacco. She reports that she does not drink alcohol or use drugs.  Outpatient Medications Prior to Visit  Medication Sig Dispense Refill  . acetaminophen (TYLENOL) 325 MG tablet Take 2 tablets (650 mg total) by mouth every 6 (six) hours as needed for mild pain.    Marland Kitchen aspirin 81 MG chewable tablet Chew 1 tablet (81 mg total) by mouth daily. 30 tablet 0  . atorvastatin (LIPITOR) 40 MG tablet TAKE ONE TABLET BY MOUTH EVERY DAY 30 tablet 3  . Calcium Carbonate-Vitamin D (CALCIUM + D) 600-200 MG-UNIT TABS Take 1 tablet by mouth 2 (two) times daily.     . carvedilol (COREG) 3.125 MG tablet TAKE ONE TABLET BY MOUTH TWICE DAILY WITH MEALS 60 tablet 1  . Cholecalciferol (D3 DOTS) 2000 UNITS TBDP Take 2,000 Units by mouth.    . levothyroxine (SYNTHROID, LEVOTHROID) 25 MCG tablet TAKE ONE TABLET BY MOUTH EVERY MORNING BEFORE BREAKFAST 30 tablet 5  . meloxicam (MOBIC) 15 MG tablet TAKE ONE TABLET BY MOUTH EVERY DAY AS NEEDED 30 tablet 2  . Multiple Vitamin (MULTIVITAMIN WITH MINERALS) TABS tablet Take 1 tablet by mouth daily.    . Multiple Vitamins-Minerals (PRESERVISION AREDS 2) CAPS Take 1 capsule by mouth 2 (two) times daily.     . pantoprazole (PROTONIX) 40 MG tablet TAKE ONE TABLET BY MOUTH EVERY DAY 30 tablet 3  . senna-docusate (SENOKOT-S) 8.6-50 MG per tablet Take 1 tablet by mouth 2 (two) times daily. 30 tablet 0  . torsemide (DEMADEX) 10 MG tablet As needed for overnight weight gain of 2 lbs 30 tablet 3   No facility-administered medications prior to visit.     Review of Systems   Patient denies headache, fevers, malaise, unintentional weight loss, skin rash, eye pain, sinus congestion and sinus pain, sore throat, dysphagia,  hemoptysis , cough, dyspnea, wheezing, chest pain, palpitations, orthopnea, edema, abdominal pain, nausea, melena, diarrhea, constipation, flank pain,  dysuria, hematuria, urinary  Frequency, nocturia, numbness, tingling, seizures,  Focal weakness, Loss of consciousness,  Tremor, insomnia, depression, anxiety, and suicidal ideation.      Objective:  BP 120/74 (BP Location: Left Arm, Patient Position: Sitting, Cuff Size: Normal)   Pulse 77   Temp 98.3 F (36.8 C) (Oral)   Resp 16   Ht 5\' 3"  (1.6 m)   Wt 174 lb 3.2 oz (79 kg)   SpO2 93%   BMI 30.86 kg/m   Physical Exam   General appearance: alert, cooperative and appears stated age Ears: hearing aide right ear. normal TM's and external ear canals both ears Throat: lips, mucosa, and tongue normal; teeth and gums normal Neck: no adenopathy, no carotid bruit, supple, symmetrical, trachea midline and thyroid not enlarged, symmetric, no tenderness/mass/nodules Back: symmetric, no curvature. ROM normal. No CVA tenderness. Lungs: clear to auscultation bilaterally Heart: regular rate and rhythm, S1, S2 normal, no murmur, click, rub or gallop Abdomen: soft, non-tender; bowel sounds normal; no masses,  no organomegaly Pulses: 2+ and symmetric Skin: Skin color, texture, turgor normal. No rashes or lesions Lymph nodes: Cervical, supraclavicular, and axillary nodes normal.    Assessment & Plan:   Problem List Items Addressed This Visit    Hyperlipidemia (Chronic)   Relevant Orders   Lipid panel (Completed)   CAD (coronary artery disease)    S/p 4 vessel CABG for [place].  She remains asymptomatic.  continue asa, carvedilol,  Atorvastatin       Diastolic dysfunction    Continue use of torsemide for LE edema .  BP is well controlled on carvedilol alone       Gastroesophageal reflux disease without esophagitis (Chronic)    MANAGED WITH DAILY USE OF PROTONIX.  No changes today       History of hip fracture    She has occasional arthritis .  She uses a walker       Hypothyroidism (Chronic)    Thyroid function is WNL on current dose.  No current changes needed.    Lab Results   Component Value Date   TSH 3.94 06/12/2017         Relevant Orders   TSH (Completed)   Iron deficiency anemia    Her hgb has dropped 2 points since she stopped taking iron supplements. Will recommended every other day dosing   Lab Results  Component Value Date   WBC 8.1 06/12/2017   HGB 10.5 (L) 06/12/2017   HCT 32.1 (L) 06/12/2017   MCV 89.8 06/12/2017   PLT 167.0 06/12/2017   Lab Results  Component Value Date   IRON 29 (L) 06/12/2017   TIBC 281 06/12/2017   FERRITIN 10 (L) 06/12/2017         Relevant Orders   Iron, TIBC and Ferritin Panel (Completed)   CBC with Differential/Platelet (Completed)   Long-term use of high-risk medication    She continues to take pantoprazole, meloxicam and atorvastatin. Screening for B12 deficiency, CKD and transaminitis has been repeatedly negative   Lab Results  Component Value Date   CREATININE 0.96 06/12/2017   Lab Results  Component Value Date   ALT 11 06/12/2017   AST 16 06/12/2017   ALKPHOS 49 06/12/2017   BILITOT 0.4 06/12/2017   Lab Results  Component Value Date   VITAMINB12 691 06/14/2016         Relevant Orders   Comprehensive metabolic panel (Completed)   Osteopenia of the elderly    With history of  Hip fracture.  She has declined treatment with Prolia.       Visit for preventive health examination - Primary    Annual comprehensive preventive exam was done as well as an evaluation and management of chronic conditions .  During the course of the visit the patient was educated and counseled about appropriate screening and preventive services including :  diabetes screening, lipid management with statin due to known CAD , nutrition counseling, breast cancer screening, and recommended immunizations incluidng Shingrx. .  Printed recommendations for health maintenance screenings was given       Other Visit Diagnoses    Breast cancer screening       Relevant Orders   MM SCREENING BREAST TOMO BILATERAL      I  am having Jenna Marshall start on Zoster Vaccine Adjuvanted. I am also having her maintain her Calcium Carbonate-Vitamin D, multivitamin with minerals, acetaminophen, PRESERVISION AREDS 2, aspirin, senna-docusate, Cholecalciferol, torsemide, meloxicam, pantoprazole, atorvastatin, carvedilol, and levothyroxine.  Meds ordered this encounter  Medications  . Zoster Vaccine Adjuvanted [facility]) injection    Sig: Inject 0.5 mLs into the muscle once for 1 dose.    Dispense:  1 each    Refill:  1    There are no discontinued medications.  Follow-up: Return in about 6 months (around 12/10/2017).   [name], MD

## 2017-06-12 NOTE — Patient Instructions (Signed)
Good to see you!   You are doing well!   No medication changes today, unless your iron or thyroid is low again    The ShingRx vaccine is now available in local pharmacies and is much more protective thant Zostavaxs,  It is therefore ADVISED for all interested adults over 50 to prevent shingles    Health Maintenance for Postmenopausal Women Menopause is a normal process in which your reproductive ability comes to an end. This process happens gradually over a span of months to years, usually between the ages of 5 and 9. Menopause is complete when you have missed 12 consecutive menstrual periods. It is important to talk with your health care provider about some of the most common conditions that affect postmenopausal women, such as heart disease, cancer, and bone loss (osteoporosis). Adopting a healthy lifestyle and getting preventive care can help to promote your health and wellness. Those actions can also lower your chances of developing some of these common conditions. What should I know about menopause? During menopause, you may experience a number of symptoms, such as:  Moderate-to-severe hot flashes.  Night sweats.  Decrease in sex drive.  Mood swings.  Headaches.  Tiredness.  Irritability.  Memory problems.  Insomnia.  Choosing to treat or not to treat menopausal changes is an individual decision that you make with your health care provider. What should I know about hormone replacement therapy and supplements? Hormone therapy products are effective for treating symptoms that are associated with menopause, such as hot flashes and night sweats. Hormone replacement carries certain risks, especially as you become older. If you are thinking about using estrogen or estrogen with progestin treatments, discuss the benefits and risks with your health care provider. What should I know about heart disease and stroke? Heart disease, heart attack, and stroke become more likely as you age.  This may be due, in part, to the hormonal changes that your body experiences during menopause. These can affect how your body processes dietary fats, triglycerides, and cholesterol. Heart attack and stroke are both medical emergencies. There are many things that you can do to help prevent heart disease and stroke:  Have your blood pressure checked at least every 1-2 years. High blood pressure causes heart disease and increases the risk of stroke.  If you are 55-80 years old, ask your health care provider if you should take aspirin to prevent a heart attack or a stroke.  Do not use any tobacco products, including cigarettes, chewing tobacco, or electronic cigarettes. If you need help quitting, ask your health care provider.  It is important to eat a healthy diet and maintain a healthy weight. ? Be sure to include plenty of vegetables, fruits, low-fat dairy products, and lean protein. ? Avoid eating foods that are high in solid fats, added sugars, or salt (sodium).  Get regular exercise. This is one of the most important things that you can do for your health. ? Try to exercise for at least 150 minutes each week. The type of exercise that you do should increase your heart rate and make you sweat. This is known as moderate-intensity exercise. ? Try to do strengthening exercises at least twice each week. Do these in addition to the moderate-intensity exercise.  Know your numbers.Ask your health care provider to check your cholesterol and your blood glucose. Continue to have your blood tested as directed by your health care provider.  What should I know about cancer screening? There are several types of cancer. Take  the following steps to reduce your risk and to catch any cancer development as early as possible. Breast Cancer  Practice breast self-awareness. ? This means understanding how your breasts normally appear and feel. ? It also means doing regular breast self-exams. Let your health care  provider know about any changes, no matter how small.  If you are 90 or older, have a clinician do a breast exam (clinical breast exam or CBE) every year. Depending on your age, family history, and medical history, it may be recommended that you also have a yearly breast X-ray (mammogram).  If you have a family history of breast cancer, talk with your health care provider about genetic screening.  If you are at high risk for breast cancer, talk with your health care provider about having an MRI and a mammogram every year.  Breast cancer (BRCA) gene test is recommended for women who have family members with BRCA-related cancers. Results of the assessment will determine the need for genetic counseling and BRCA1 and for BRCA2 testing. BRCA-related cancers include these types: ? Breast. This occurs in males or females. ? Ovarian. ? Tubal. This may also be called fallopian tube cancer. ? Cancer of the abdominal or pelvic lining (peritoneal cancer). ? Prostate. ? Pancreatic.  Cervical, Uterine, and Ovarian Cancer Your health care provider may recommend that you be screened regularly for cancer of the pelvic organs. These include your ovaries, uterus, and vagina. This screening involves a pelvic exam, which includes checking for microscopic changes to the surface of your cervix (Pap test).  For women ages 21-65, health care providers may recommend a pelvic exam and a Pap test every three years. For women ages 69-65, they may recommend the Pap test and pelvic exam, combined with testing for human papilloma virus (HPV), every five years. Some types of HPV increase your risk of cervical cancer. Testing for HPV may also be done on women of any age who have unclear Pap test results.  Other health care providers may not recommend any screening for nonpregnant women who are considered low risk for pelvic cancer and have no symptoms. Ask your health care provider if a screening pelvic exam is right for  you.  If you have had past treatment for cervical cancer or a condition that could lead to cancer, you need Pap tests and screening for cancer for at least 20 years after your treatment. If Pap tests have been discontinued for you, your risk factors (such as having a new sexual partner) need to be reassessed to determine if you should start having screenings again. Some women have medical problems that increase the chance of getting cervical cancer. In these cases, your health care provider may recommend that you have screening and Pap tests more often.  If you have a family history of uterine cancer or ovarian cancer, talk with your health care provider about genetic screening.  If you have vaginal bleeding after reaching menopause, tell your health care provider.  There are currently no reliable tests available to screen for ovarian cancer.  Lung Cancer Lung cancer screening is recommended for adults 54-20 years old who are at high risk for lung cancer because of a history of smoking. A yearly low-dose CT scan of the lungs is recommended if you:  Currently smoke.  Have a history of at least 30 pack-years of smoking and you currently smoke or have quit within the past 15 years. A pack-year is smoking an average of one pack of cigarettes per  day for one year.  Yearly screening should:  Continue until it has been 15 years since you quit.  Stop if you develop a health problem that would prevent you from having lung cancer treatment.  Colorectal Cancer  This type of cancer can be detected and can often be prevented.  Routine colorectal cancer screening usually begins at age 56 and continues through age 34.  If you have risk factors for colon cancer, your health care provider may recommend that you be screened at an earlier age.  If you have a family history of colorectal cancer, talk with your health care provider about genetic screening.  Your health care provider may also recommend  using home test kits to check for hidden blood in your stool.  A small camera at the end of a tube can be used to examine your colon directly (sigmoidoscopy or colonoscopy). This is done to check for the earliest forms of colorectal cancer.  Direct examination of the colon should be repeated every 5-10 years until age 66. However, if early forms of precancerous polyps or small growths are found or if you have a family history or genetic risk for colorectal cancer, you may need to be screened more often.  Skin Cancer  Check your skin from head to toe regularly.  Monitor any moles. Be sure to tell your health care provider: ? About any new moles or changes in moles, especially if there is a change in a mole's shape or color. ? If you have a mole that is larger than the size of a pencil eraser.  If any of your family members has a history of skin cancer, especially at a young age, talk with your health care provider about genetic screening.  Always use sunscreen. Apply sunscreen liberally and repeatedly throughout the day.  Whenever you are outside, protect yourself by wearing long sleeves, pants, a wide-brimmed hat, and sunglasses.  What should I know about osteoporosis? Osteoporosis is a condition in which bone destruction happens more quickly than new bone creation. After menopause, you may be at an increased risk for osteoporosis. To help prevent osteoporosis or the bone fractures that can happen because of osteoporosis, the following is recommended:  If you are 36-50 years old, get at least 1,000 mg of calcium and at least 600 mg of vitamin D per day.  If you are older than age 65 but younger than age 58, get at least 1,200 mg of calcium and at least 600 mg of vitamin D per day.  If you are older than age 45, get at least 1,200 mg of calcium and at least 800 mg of vitamin D per day.  Smoking and excessive alcohol intake increase the risk of osteoporosis. Eat foods that are rich in  calcium and vitamin D, and do weight-bearing exercises several times each week as directed by your health care provider. What should I know about how menopause affects my mental health? Depression may occur at any age, but it is more common as you become older. Common symptoms of depression include:  Low or sad mood.  Changes in sleep patterns.  Changes in appetite or eating patterns.  Feeling an overall lack of motivation or enjoyment of activities that you previously enjoyed.  Frequent crying spells.  Talk with your health care provider if you think that you are experiencing depression. What should I know about immunizations? It is important that you get and maintain your immunizations. These include:  Tetanus, diphtheria, and pertussis (  Tdap) booster vaccine.  Influenza every year before the flu season begins.  Pneumonia vaccine.  Shingles vaccine.  Your health care provider may also recommend other immunizations. This information is not intended to replace advice given to you by your health care provider. Make sure you discuss any questions you have with your health care provider. Document Released: 07/12/2005 Document Revised: 12/08/2015 Document Reviewed: 02/21/2015 Elsevier Interactive Patient Education  2018 [employer].

## 2017-06-13 LAB — IRON,TIBC AND FERRITIN PANEL
%SAT: 10 % — AB (ref 11–50)
Ferritin: 10 ng/mL — ABNORMAL LOW (ref 20–288)
Iron: 29 ug/dL — ABNORMAL LOW (ref 45–160)
TIBC: 281 mcg/dL (calc) (ref 250–450)

## 2017-06-15 ENCOUNTER — Encounter: Payer: Self-pay | Admitting: Internal Medicine

## 2017-06-15 NOTE — Assessment & Plan Note (Signed)
She continues to take pantoprazole, meloxicam and atorvastatin. Screening for B12 deficiency, CKD and transaminitis has been repeatedly negative   Lab Results  Component Value Date   CREATININE 0.96 06/12/2017   Lab Results  Component Value Date   ALT 11 06/12/2017   AST 16 06/12/2017   ALKPHOS 49 06/12/2017   BILITOT 0.4 06/12/2017   Lab Results  Component Value Date   VITAMINB12 691 06/14/2016

## 2017-06-15 NOTE — Assessment & Plan Note (Signed)
MANAGED WITH DAILY USE OF PROTONIX.  No changes today  

## 2017-06-15 NOTE — Assessment & Plan Note (Signed)
She has occasional arthritis .  She uses a walker

## 2017-06-15 NOTE — Assessment & Plan Note (Signed)
Thyroid function is WNL on current dose.  No current changes needed.    Lab Results  Component Value Date   TSH 3.94 06/12/2017

## 2017-06-15 NOTE — Assessment & Plan Note (Signed)
S/p 4 vessel CABG for [place].  She remains asymptomatic.  continue asa, carvedilol,  Atorvastatin

## 2017-06-15 NOTE — Assessment & Plan Note (Signed)
Her hgb has dropped 2 points since she stopped taking iron supplements. Will recommended every other day dosing   Lab Results  Component Value Date   WBC 8.1 06/12/2017   HGB 10.5 (L) 06/12/2017   HCT 32.1 (L) 06/12/2017   MCV 89.8 06/12/2017   PLT 167.0 06/12/2017   Lab Results  Component Value Date   IRON 29 (L) 06/12/2017   TIBC 281 06/12/2017   FERRITIN 10 (L) 06/12/2017

## 2017-06-15 NOTE — Assessment & Plan Note (Signed)
Continue use of torsemide for LE edema .  BP is well controlled on carvedilol alone

## 2017-06-15 NOTE — Assessment & Plan Note (Signed)
Annual comprehensive preventive exam was done as well as an evaluation and management of chronic conditions .  During the course of the visit the patient was educated and counseled about appropriate screening and preventive services including :  diabetes screening, lipid management with statin due to known CAD , nutrition counseling, breast cancer screening, and recommended immunizations incluidng Shingrx. .  Printed recommendations for health maintenance screenings was given

## 2017-06-15 NOTE — Assessment & Plan Note (Signed)
With history of  Hip fracture.  She has declined treatment with Prolia.

## 2017-07-06 ENCOUNTER — Other Ambulatory Visit: Payer: Self-pay | Admitting: Internal Medicine

## 2017-07-13 ENCOUNTER — Other Ambulatory Visit: Payer: Self-pay | Admitting: Internal Medicine

## 2017-07-27 ENCOUNTER — Other Ambulatory Visit: Payer: Self-pay | Admitting: Internal Medicine

## 2017-08-18 ENCOUNTER — Other Ambulatory Visit: Payer: Self-pay | Admitting: Cardiovascular Disease

## 2017-09-04 ENCOUNTER — Ambulatory Visit: Payer: Self-pay

## 2017-09-05 ENCOUNTER — Ambulatory Visit: Payer: Self-pay

## 2017-09-15 ENCOUNTER — Ambulatory Visit: Payer: Self-pay

## 2017-09-16 DIAGNOSIS — H353132 Nonexudative age-related macular degeneration, bilateral, intermediate dry stage: Secondary | ICD-10-CM | POA: Diagnosis not present

## 2017-09-17 ENCOUNTER — Ambulatory Visit (INDEPENDENT_AMBULATORY_CARE_PROVIDER_SITE_OTHER): Payer: Medicare HMO

## 2017-09-17 VITALS — BP 134/64 | HR 64 | Temp 98.4°F | Resp 14 | Ht 63.0 in | Wt 178.8 lb

## 2017-09-17 DIAGNOSIS — Z0000 Encounter for general adult medical examination without abnormal findings: Secondary | ICD-10-CM | POA: Diagnosis not present

## 2017-09-17 NOTE — Progress Notes (Signed)
Subjective:   Jenna Marshall is a 84 y.o. female who presents for Medicare Annual (Subsequent) preventive examination.  Review of Systems:  No ROS.  Medicare Wellness Visit. Additional risk factors are reflected in the social history.  Cardiac Risk Factors include: advanced age (>1men, >1 women);obesity (BMI >30kg/m2);hypertension     Objective:     Vitals: BP 134/64 (BP Location: Left Arm, Patient Position: Sitting, Cuff Size: Normal)   Pulse 64   Temp 98.4 F (36.9 C) (Oral)   Resp 14   Ht 5\' 3"  (1.6 m)   Wt 178 lb 12.8 oz (81.1 kg)   BMI 31.67 kg/m   Body mass index is 31.67 kg/m.  Advanced Directives 09/17/2017 09/04/2016 07/16/2016 04/30/2016 01/26/2015 01/13/2015 01/02/2015  Does Patient Have a Medical Advance Directive? Yes Yes No No No No No  Type of Paramedic of [place];Living will - - - - -  Does patient want to make changes to medical advance directive? No - Patient declined No - Patient declined - No - Patient declined - - -  Copy of [place] in Chart? No - copy requested No - copy requested - - - - -  Would patient like information on creating a medical advance directive? - - No - Patient declined No - Patient declined Yes - Scientist, clinical (histocompatibility and immunogenetics) given Yes - Scientist, clinical (histocompatibility and immunogenetics) given Yes - Scientist, clinical (histocompatibility and immunogenetics) given    Tobacco Social History   Tobacco Use  Smoking Status Never Smoker  Smokeless Tobacco Never Used     Counseling given: Not Answered   Clinical Intake:  Pre-visit preparation completed: Yes  Pain : No/denies pain     Nutritional Status: BMI > 30  Obese Diabetes: No  How often do you need to have someone help you when you read instructions, pamphlets, or other written materials from your doctor or pharmacy?: 2 - Rarely  Interpreter Needed?: No     Past Medical History:  Diagnosis Date  . Arthritis   . CAD (coronary artery disease)    a. Lexiscan 10/13/14:  mid anterior to apical & inf wall ischemia w/ WMA, mild to mod dep EF; b. cardiac cath 10/14/2014: ost LM to LM 50%, LM 99%, ost LAD 95%, ost LAD 95%, prox LAD to mid LAD 40%, prox RCA 30%, mid RCA 70%, Critical left main stenosis. Significant distal RCA stenosis. Heavily calcified arteries. Normal EF by echo with no significant AS or MR. Recommend urgent CABG. cCABG x 4 (5/201  . CAD (coronary artery disease)    a. Lexi 10/13/14: mid ant to apical & inf wall ischemia w/ WMA, mild-mod dep EF; b. cath 10/14/14: ost LM to LM 50%, dLM 99%, ost LAD 95%, ost LAD 95%, pLAD-LAD 40%, pRCA 30%, mRCA 70%, Critical LM stenosis. Sig dRCA stenosis. Heavily calcified arteries; c. CABG x 4 (10/2014)  . Depression   . Diastolic dysfunction    a. echo 10/2014: EF 55-60%, no RWMA, GR1DD, mild AI, trivial MR, mildly dilated LA, PASP normal  . GERD (gastroesophageal reflux disease)   . Hyperlipemia   . Hyperlipidemia   . Hypertension   . Hypothyroidism   . Macular degeneration   . pernicious anemia   . Varicose veins   . Wears dentures    partial upper  . Wears hearing aid    right   Past Surgical History:  Procedure Laterality Date  . ANTERIOR VITRECTOMY Right 07/16/2016   Procedure: ANTERIOR VITRECTOMY;  Surgeon: Eulogio Bear, MD;  Location: [facility];  Service: Ophthalmology;  Laterality: Right;  . CARDIAC CATHETERIZATION Left 10/14/2014   Procedure: Left Heart Cath and Coronary Angiography;  Surgeon: Wellington Hampshire, MD;  Location: [facility];  Service: Cardiovascular;  Laterality: Left;  . CATARACT EXTRACTION W/PHACO Left 04/30/2016   Procedure: CATARACT EXTRACTION PHACO AND INTRAOCULAR LENS PLACEMENT (IOC);  Surgeon: Eulogio Bear, MD;  Location: [facility];  Service: Ophthalmology;  Laterality: Left;  LEFT  . CATARACT EXTRACTION W/PHACO Right 07/16/2016   Procedure: CATARACT EXTRACTION PHACO AND INTRAOCULAR LENS PLACEMENT ([place])  Right;  Surgeon: Eulogio Bear, MD;   Location: [facility];  Service: Ophthalmology;  Laterality: Right;  . CORONARY ARTERY BYPASS GRAFT N/A 10/14/2014   Procedure: CORONARY ARTERY BYPASS GRAFTING (CABG), ON PUMP, TIMES FOUR, USING LEFT INTERNAL MAMMARY ARTERY, RIGHT GREATER SAPHENOUS VEIN HARVESTED ENDOSCOPICALLY;  Surgeon: Gaye Pollack, MD;  Location: [facility];  Service: Open Heart Surgery;  Laterality: N/A;  . CORONARY ARTERY BYPASS GRAFT  10/2014   LIMA-->LAD, SVG-->RCA, sequential SVG-->[place] and OM  . HEMORRHOID SURGERY    . HIP ARTHROPLASTY Left 01/14/2015   Procedure: ARTHROPLASTY BIPOLAR HIP (HEMIARTHROPLASTY);  Surgeon: Dereck Leep, MD;  Location: [facility];  Service: Orthopedics;  Laterality: Left;  Marland Kitchen VARICOSE VEIN SURGERY     Family History  Problem Relation Age of Onset  . Coronary artery disease Mother   . Diabetes Mother   . Heart disease Father   . Coronary artery disease Brother   . Breast cancer Neg Hx    Social History   Socioeconomic History  . Marital status: Widowed    Spouse name: Not on file  . Number of children: Not on file  . Years of education: Not on file  . Highest education level: Not on file  Occupational History  . Not on file  Social Needs  . Financial resource strain: Not on file  . Food insecurity:    Worry: Never true    Inability: Never true  . Transportation needs:    Medical: No    Non-medical: No  Tobacco Use  . Smoking status: Never Smoker  . Smokeless tobacco: Never Used  Substance and Sexual Activity  . Alcohol use: No  . Drug use: No  . Sexual activity: Never  Lifestyle  . Physical activity:    Days per week: Not on file    Minutes per session: Not on file  . Stress: Not at all  Relationships  . Social connections:    Talks on phone: Not on file    Gets together: Not on file    Attends religious service: Not on file    Active member of club or organization: Not on file    Attends meetings of clubs or organizations: Not on file    Relationship  status: Not on file  Other Topics Concern  . Not on file  Social History Narrative   ** Merged History Encounter **        Outpatient Encounter Medications as of 09/17/2017  Medication Sig  . acetaminophen (TYLENOL) 325 MG tablet Take 2 tablets (650 mg total) by mouth every 6 (six) hours as needed for mild pain.  Marland Kitchen aspirin 81 MG chewable tablet Chew 1 tablet (81 mg total) by mouth daily.  Marland Kitchen atorvastatin (LIPITOR) 40 MG tablet TAKE ONE TABLET BY MOUTH EVERY DAY  . Calcium Carbonate-Vitamin D (CALCIUM + D) 600-200 MG-UNIT TABS Take 1  tablet by mouth 2 (two) times daily.   . carvedilol (COREG) 3.125 MG tablet TAKE ONE TABLET BY MOUTH TWICE DAILY WITH MEALS  . Cholecalciferol (D3 DOTS) 2000 UNITS TBDP Take 2,000 Units by mouth.  . levothyroxine (SYNTHROID, LEVOTHROID) 25 MCG tablet TAKE ONE TABLET BY MOUTH EVERY MORNING BEFORE BREAKFAST  . meloxicam (MOBIC) 15 MG tablet TAKE ONE TABLET BY MOUTH EVERY DAY AS NEEDED  . Multiple Vitamin (MULTIVITAMIN WITH MINERALS) TABS tablet Take 1 tablet by mouth daily.  . Multiple Vitamins-Minerals (PRESERVISION AREDS 2) CAPS Take 1 capsule by mouth 2 (two) times daily.   . pantoprazole (PROTONIX) 40 MG tablet TAKE ONE TABLET BY MOUTH EVERY DAY  . senna-docusate (SENOKOT-S) 8.6-50 MG per tablet Take 1 tablet by mouth 2 (two) times daily.  Marland Kitchen torsemide (DEMADEX) 10 MG tablet As needed for overnight weight gain of 2 lbs  . [DISCONTINUED] citalopram (CELEXA) 20 MG tablet Take 20 mg by mouth daily.     No facility-administered encounter medications on file as of 09/17/2017.     Activities of Daily Living In your present state of health, do you have any difficulty performing the following activities: 09/17/2017  Hearing? Y  Comment Hearing aid, L ear only  Vision? Y  Comment Macular degeneration  Difficulty concentrating or making decisions? N  Walking or climbing stairs? Y  Comment Unsteady gait.   Dressing or bathing? N  Doing errands, shopping? Y    Comment She does not Physiological scientist and eating ? N  Using the Toilet? N  In the past six months, have you accidently leaked urine? Y  Comment Managed with a daily pad  Do you have problems with loss of bowel control? N  Managing your Medications? Y  Comment Daughter assists  Managing your Finances? Y  Comment Daughter assists  Housekeeping or managing your Housekeeping? Y  Comment Daughter assists as needed  Some recent data might be hidden    Patient Care Team: [name], MD as PCP - General (Internal Medicine) [name], MD (Internal Medicine) Hessie Knows, MD as Consulting Physician (Orthopedic Surgery)    Assessment:   This is a routine wellness examination for Jenna Marshall.  The goal of the wellness visit is to assist the patient how to close the gaps in care and create a preventative care plan for the patient.   The roster of all physicians providing medical care to patient is listed in the Snapshot section of the chart.  Taking calcium VIT D as appropriate/Osteoporosis risk reviewed.    Safety issues reviewed; Life alert. Smoke and carbon monoxide detectors in the home. No firearms in the home. Wears seatbelts when riding with others. No violence in the home.  They do not have excessive sun exposure.  Discussed the need for sun protection: hats, long sleeves and the use of sunscreen if there is significant sun exposure.  Patient is alert, normal appearance, oriented to person/place/and time.  Correctly identified the president of the [place] and recalls of 3/3 words. Performs simple calculations and can read correct time from watch face. Displays appropriate judgement.  No new identified risk were noted.  No failures at ADL's or IADL's.  Ambulates with walker or cane.   BMI- discussed the importance of a healthy diet, water intake and the benefits of aerobic exercise. Educational material provided.   24 hour diet recall: Dental- dentures.   Eye- Visual  acuity not assessed per patient preference since they have regular follow up  with the ophthalmologist.  Wears corrective lenses.  Sleep patterns- Sleeps without issues.   Health maintenance gaps- closed.  Patient Concerns: None at this time. Follow up with PCP as needed.  Exercise Activities and Dietary recommendations Current Exercise Habits: Home exercise routine, Type of exercise: calisthenics(Standing/chair exercises), Time (Minutes): 10, Frequency (Times/Week): 5, Weekly Exercise (Minutes/Week): 50, Intensity: Mild  Goals    . Maintain Healthy Lifestyle     Stay hydrated Healthy diet Stay active       Fall Risk Fall Risk  09/17/2017 09/04/2016 07/31/2015 03/10/2015 01/26/2015  Falls in the past year? No No Yes Yes No  Number falls in past yr: - - 1 1 -  Injury with Fall? - - Yes Yes -  Risk Factor Category  - - (No Data) - -  Comment - - Hip fractured  - -  Risk for fall due to : - - Other (Comment) Impaired mobility;Medication side effect History of fall(s);Impaired balance/gait;Impaired mobility  Risk for fall due to: Comment - - Trip over chair. - -  Follow up - - Falls evaluation completed Education provided;Falls prevention discussed -   Depression Screen PHQ 2/9 Scores 09/17/2017 09/04/2016 07/31/2015 03/10/2015  PHQ - 2 Score 0 0 0 0     Cognitive Function     6CIT Screen 09/17/2017 09/04/2016  What Year? 0 points 0 points  What month? 0 points 0 points  What time? 0 points 0 points  Count back from 20 0 points 0 points  Months in reverse 0 points 0 points  Repeat phrase 0 points 0 points  Total Score 0 0    Immunization History  Administered Date(s) Administered  . Influenza Split 02/27/2011, 03/04/2012  . Influenza, High Dose Seasonal PF 03/31/2013  . Influenza,inj,Quad PF,6+ Mos 03/30/2014  . Influenza-Unspecified 02/06/2015, 02/06/2016, 03/05/2017  . Pneumococcal Conjugate-13 03/30/2014  . Pneumococcal Polysaccharide-23 03/10/2009, 04/19/2015  . Tdap  10/06/2013  . Zoster 03/11/2011   Screening Tests Health Maintenance  Topic Date Due  . INFLUENZA VACCINE  01/01/2018  . TETANUS/TDAP  10/07/2023  . DEXA SCAN  Completed  . PNA vac Low Risk Adult  Completed      Plan:    End of life planning; Advance aging; Advanced directives discussed. Copy of current HCPOA/Living Will requested.    I have personally reviewed and noted the following in the patient's chart:   . Medical and social history . Use of alcohol, tobacco or illicit drugs  . Current medications and supplements . Functional ability and status . Nutritional status . Physical activity . Advanced directives . List of other physicians . Hospitalizations, surgeries, and ER visits in previous 12 months . Vitals . Screenings to include cognitive, depression, and falls . Referrals and appointments  In addition, I have reviewed and discussed with patient certain preventive protocols, quality metrics, and best practice recommendations. A written personalized care plan for preventive services as well as general preventive health recommendations were provided to patient.     Varney Biles, LPN  [id number]

## 2017-09-17 NOTE — Patient Instructions (Addendum)
  Jenna Marshall , Thank you for taking time to come for your Medicare Wellness Visit. I appreciate your ongoing commitment to your health goals. Please review the following plan we discussed and let me know if I can assist you in the future.   Follow up as needed.    Bring a copy of your [place] and/or Living Will to be scanned into chart.  Have a great day!  These are the goals we discussed: Goals    . Maintain Healthy Lifestyle     Stay hydrated Healthy diet Stay active       This is a list of the screening recommended for you and due dates:  Health Maintenance  Topic Date Due  . Flu Shot  01/01/2018  . Tetanus Vaccine  10/07/2023  . DEXA scan (bone density measurement)  Completed  . Pneumonia vaccines  Completed

## 2017-10-03 ENCOUNTER — Other Ambulatory Visit: Payer: Self-pay | Admitting: Internal Medicine

## 2017-10-17 ENCOUNTER — Other Ambulatory Visit: Payer: Self-pay | Admitting: Cardiovascular Disease

## 2017-11-26 ENCOUNTER — Other Ambulatory Visit: Payer: Self-pay | Admitting: Internal Medicine

## 2017-11-26 ENCOUNTER — Encounter (INDEPENDENT_AMBULATORY_CARE_PROVIDER_SITE_OTHER): Payer: Self-pay

## 2017-11-26 ENCOUNTER — Ambulatory Visit
Admission: RE | Admit: 2017-11-26 | Discharge: 2017-11-26 | Disposition: A | Discharge status: Home / Self Care | Payer: Medicare HMO | Source: Ambulatory Visit | Attending: Internal Medicine | Admitting: Internal Medicine

## 2017-11-26 DIAGNOSIS — Z1239 Encounter for other screening for malignant neoplasm of breast: Secondary | ICD-10-CM

## 2017-11-26 DIAGNOSIS — Z1231 Encounter for screening mammogram for malignant neoplasm of breast: Secondary | ICD-10-CM | POA: Insufficient documentation

## 2017-11-28 ENCOUNTER — Other Ambulatory Visit: Payer: Self-pay | Admitting: Internal Medicine

## 2017-12-05 ENCOUNTER — Other Ambulatory Visit: Payer: Self-pay

## 2017-12-05 MED ORDER — LEVOTHYROXINE SODIUM 25 MCG PO TABS: 25.0000 ug | ORAL_TABLET | Freq: Every day | ORAL | 0 refills | Status: DC

## 2017-12-10 ENCOUNTER — Encounter: Payer: Self-pay | Admitting: Internal Medicine

## 2017-12-10 ENCOUNTER — Ambulatory Visit (INDEPENDENT_AMBULATORY_CARE_PROVIDER_SITE_OTHER): Payer: Medicare HMO | Admitting: Internal Medicine

## 2017-12-10 VITALS — BP 122/74 | HR 63 | Temp 98.5°F | Resp 16 | Ht 63.0 in | Wt 177.6 lb

## 2017-12-10 DIAGNOSIS — I10 Essential (primary) hypertension: Secondary | ICD-10-CM

## 2017-12-10 DIAGNOSIS — D508 Other iron deficiency anemias: Secondary | ICD-10-CM

## 2017-12-10 DIAGNOSIS — E7800 Pure hypercholesterolemia, unspecified: Secondary | ICD-10-CM | POA: Diagnosis not present

## 2017-12-10 DIAGNOSIS — I2510 Atherosclerotic heart disease of native coronary artery without angina pectoris: Secondary | ICD-10-CM

## 2017-12-10 DIAGNOSIS — E034 Atrophy of thyroid (acquired): Secondary | ICD-10-CM

## 2017-12-10 LAB — COMPREHENSIVE METABOLIC PANEL
ALBUMIN: 4.1 g/dL (ref 3.5–5.2)
ALT: 11 U/L (ref 0–35)
AST: 17 U/L (ref 0–37)
Alkaline Phosphatase: 50 U/L (ref 39–117)
BUN: 25 mg/dL — ABNORMAL HIGH (ref 6–23)
CALCIUM: 9.5 mg/dL (ref 8.4–10.5)
CHLORIDE: 101 meq/L (ref 96–112)
CO2: 32 mEq/L (ref 19–32)
CREATININE: 0.97 mg/dL (ref 0.40–1.20)
GFR: 57.34 mL/min — ABNORMAL LOW (ref >60.00)
Glucose, Bld: 101 mg/dL — ABNORMAL HIGH (ref 70–99)
POTASSIUM: 4.7 meq/L (ref 3.5–5.1)
SODIUM: 138 meq/L (ref 135–145)
Total Bilirubin: 0.5 mg/dL (ref 0.2–1.2)
Total Protein: 6.9 g/dL (ref 6.0–8.3)

## 2017-12-10 LAB — CBC WITH DIFFERENTIAL/PLATELET
BASOS PCT: 1.2 % (ref 0.0–3.0)
Basophils Absolute: 0.1 10*3/uL (ref 0.0–0.1)
EOS ABS: 0.1 10*3/uL (ref 0.0–0.7)
EOS PCT: 2 % (ref 0.0–5.0)
HEMATOCRIT: 36.6 % (ref 36.0–46.0)
Hemoglobin: 12.4 g/dL (ref 12.0–15.0)
Lymphocytes Relative: 20 % (ref 12.0–46.0)
Lymphs Abs: 1.4 10*3/uL (ref 0.7–4.0)
MCHC: 33.9 g/dL (ref 30.0–36.0)
MCV: 96.4 fl (ref 78.0–100.0)
MONO ABS: 0.9 10*3/uL (ref 0.1–1.0)
Monocytes Relative: 13.5 % — ABNORMAL HIGH (ref 3.0–12.0)
NEUTROS ABS: 4.3 10*3/uL (ref 1.4–7.7)
Neutrophils Relative %: 63.3 % (ref 43.0–77.0)
PLATELETS: 151 10*3/uL (ref 150.0–400.0)
RBC: 3.8 Mil/uL — ABNORMAL LOW (ref 3.87–5.11)
RDW: 14.1 % (ref 11.5–15.5)
WBC: 6.9 10*3/uL (ref 4.0–10.5)

## 2017-12-10 LAB — LIPID PANEL
CHOL/HDL RATIO: 2
CHOLESTEROL: 131 mg/dL (ref 0–200)
HDL: 54.5 mg/dL (ref >39.00)
LDL Cholesterol: 61 mg/dL (ref 0–99)
NonHDL: 76.87
TRIGLYCERIDES: 79 mg/dL (ref 0.0–149.0)
VLDL: 15.8 mg/dL (ref 0.0–40.0)

## 2017-12-10 LAB — IRON,TIBC AND FERRITIN PANEL
%SAT: 22 % (calc) (ref 16–45)
Ferritin: 31 ng/mL (ref 16–288)
IRON: 53 ug/dL (ref 45–160)
TIBC: 239 ug/dL — AB (ref 250–450)

## 2017-12-10 LAB — TSH: TSH: 3.62 u[IU]/mL (ref 0.35–4.50)

## 2017-12-10 NOTE — Progress Notes (Signed)
Subjective:  Patient ID: Jenna Marshall, female    DOB: 24-Dec-1925  Age: 84 y.o. MRN: [id number]  CC: The primary encounter diagnosis was Hypothyroidism due to acquired atrophy of thyroid. Diagnoses of Other iron deficiency anemia, Essential hypertension, Pure hypercholesterolemia, and Coronary artery disease involving native coronary artery of native heart without angina pectoris were also pertinent to this visit.  HPI Jenna Marshall presents for follow up on hypothyroid,  Hypertension, Hyperlipidemia  With known CAD ,  And IDA with drop in hgb since stopping iron therapy .  Since her last visit she has resumed oral therapy and is tolerating iron therapy every other day .    Hypertension: patient checks blood pressure twice weekly at home.  Readings have been for the most part > 140/80 at rest . Patient is following a reduce salt diet most days and is taking medications as prescribed  Wants to refill for 90 days,  Same pharmacy   No recent falls.  Uses a cane/walker due to pain involving hips and knees that is aggravated by rest.   Has a daytime caregiver who is with her today .  IADLs but does not drive anymore   Outpatient Medications Prior to Visit  Medication Sig Dispense Refill  . acetaminophen (TYLENOL) 325 MG tablet Take 2 tablets (650 mg total) by mouth every 6 (six) hours as needed for mild pain.    Marland Kitchen aspirin 81 MG chewable tablet Chew 1 tablet (81 mg total) by mouth daily. 30 tablet 0  . atorvastatin (LIPITOR) 40 MG tablet TAKE ONE TABLET BY MOUTH EVERY DAY 30 tablet 3  . Calcium Carbonate-Vitamin D (CALCIUM + D) 600-200 MG-UNIT TABS Take 1 tablet by mouth 2 (two) times daily.     . carvedilol (COREG) 3.125 MG tablet TAKE ONE TABLET BY MOUTH TWICE DAILY WITH MEALS 180 tablet 1  . Cholecalciferol (D3 DOTS) 2000 UNITS TBDP Take 2,000 Units by mouth.    . levothyroxine (SYNTHROID, LEVOTHROID) 25 MCG tablet Take 1 tablet (25 mcg total) by mouth daily with breakfast. 90 tablet 0  .  meloxicam (MOBIC) 15 MG tablet TAKE ONE TABLET BY MOUTH EVERY DAY AS NEEDED 30 tablet 2  . Multiple Vitamin (MULTIVITAMIN WITH MINERALS) TABS tablet Take 1 tablet by mouth daily.    . Multiple Vitamins-Minerals (PRESERVISION AREDS 2) CAPS Take 1 capsule by mouth 2 (two) times daily.     . pantoprazole (PROTONIX) 40 MG tablet TAKE ONE TABLET BY MOUTH EVERY DAY 90 tablet 1  . senna-docusate (SENOKOT-S) 8.6-50 MG per tablet Take 1 tablet by mouth 2 (two) times daily. 30 tablet 0  . torsemide (DEMADEX) 10 MG tablet As needed for overnight weight gain of 2 lbs 30 tablet 3   No facility-administered medications prior to visit.     Review of Systems;  Patient denies headache, fevers, malaise, unintentional weight loss, skin rash, eye pain, sinus congestion and sinus pain, sore throat, dysphagia,  hemoptysis , cough, dyspnea, wheezing, chest pain, palpitations, orthopnea, edema, abdominal pain, nausea, melena, diarrhea, constipation, flank pain, dysuria, hematuria, urinary  Frequency, nocturia, numbness, tingling, seizures,  Focal weakness, Loss of consciousness,  Tremor, insomnia, depression, anxiety, and suicidal ideation.      Objective:  BP 122/74 (BP Location: Left Arm, Patient Position: Sitting, Cuff Size: Normal)   Pulse 63   Temp 98.5 F (36.9 C) (Oral)   Resp 16   Ht 5\' 3"  (1.6 m)   Wt 177 lb 9.6 oz (80.6 kg)  SpO2 96%   BMI 31.46 kg/m   BP Readings from Last 3 Encounters:  12/10/17 122/74  09/17/17 134/64  06/12/17 120/74    Wt Readings from Last 3 Encounters:  12/10/17 177 lb 9.6 oz (80.6 kg)  09/17/17 178 lb 12.8 oz (81.1 kg)  06/12/17 174 lb 3.2 oz (79 kg)    General appearance: alert, cooperative and appears stated age Ears: normal TM's and external ear canals both ears Throat: lips, mucosa, and tongue normal; teeth and gums normal Neck: no adenopathy, no carotid bruit, supple, symmetrical, trachea midline and thyroid not enlarged, symmetric, no  tenderness/mass/nodules Back: symmetric, no curvature. ROM normal. No CVA tenderness. Lungs: clear to auscultation bilaterally Heart: regular rate and rhythm, S1, S2 normal, no murmur, click, rub or gallop Abdomen: soft, non-tender; bowel sounds normal; no masses,  no organomegaly Pulses: 2+ and symmetric Skin: Skin color, texture, turgor normal. No rashes or lesions Lymph nodes: Cervical, supraclavicular, and axillary nodes normal.  No results found for: HGBA1C  Lab Results  Component Value Date   CREATININE 0.97 12/10/2017   CREATININE 0.96 06/12/2017   CREATININE 1.02 11/07/2016    Lab Results  Component Value Date   WBC 6.9 12/10/2017   HGB 12.4 12/10/2017   HCT 36.6 12/10/2017   PLT 151.0 12/10/2017   GLUCOSE 101 (H) 12/10/2017   CHOL 131 12/10/2017   TRIG 79.0 12/10/2017   HDL 54.50 12/10/2017   LDLDIRECT 104.4 02/27/2011   LDLCALC 61 12/10/2017   ALT 11 12/10/2017   AST 17 12/10/2017   NA 138 12/10/2017   K 4.7 12/10/2017   CL 101 12/10/2017   CREATININE 0.97 12/10/2017   BUN 25 (H) 12/10/2017   CO2 32 12/10/2017   TSH 3.62 12/10/2017   INR 1.64 (H) 10/14/2014    Mm Digital Screening Bilateral  Result Date: 11/26/2017 CLINICAL DATA:  Screening. EXAM: DIGITAL SCREENING BILATERAL MAMMOGRAM WITH CAD COMPARISON:  Previous exam(s). ACR Breast Density Category b: There are scattered areas of fibroglandular density. FINDINGS: There are no findings suspicious for malignancy. Images were processed with CAD. IMPRESSION: No mammographic evidence of malignancy. A result letter of this screening mammogram will be mailed directly to the patient. RECOMMENDATION: Screening mammogram in one year. (Code:SM-B-01Y) BI-RADS CATEGORY  1: Negative. Electronically Signed   By: Lajean Manes M.D.   On: 11/26/2017 15:20    Assessment & Plan:   Problem List Items Addressed This Visit    CAD (coronary artery disease)    S/p 4 vessel CABG for [place].  Normal EF .  She remains asymptomatic  and is tolerating medication regimen which includes daily  asa, carvedilol,  And Atorvastatin       Essential hypertension (Chronic)    Well controlled on current regimen. Renal function stable, no changes today.  Lab Results  Component Value Date   CREATININE 0.97 12/10/2017   Lab Results  Component Value Date   NA 138 12/10/2017   K 4.7 12/10/2017   CL 101 12/10/2017   CO2 32 12/10/2017         Relevant Orders   Comprehensive metabolic panel (Completed)   Hyperlipidemia (Chronic)    LDL and triglycerides are at goal on atorvastatin.   She has no side effects ,  Has known multivessel CAD,  and liver enzymes are normal. No changes today   Lab Results  Component Value Date   CHOL 131 12/10/2017   HDL 54.50 12/10/2017   LDLCALC 61 12/10/2017   LDLDIRECT 104.4 02/27/2011  TRIG 79.0 12/10/2017   CHOLHDL 2 12/10/2017         Relevant Orders   Lipid panel (Completed)   Hypothyroidism - Primary (Chronic)    Thyroid function is WNL on current dose.  No current changes needed.    Lab Results  Component Value Date   TSH 3.62 12/10/2017         Relevant Orders   TSH (Completed)   Iron deficiency anemia    Resolved on current regimen of iron supplements EOD.  Lab Results  Component Value Date   WBC 6.9 12/10/2017   HGB 12.4 12/10/2017   HCT 36.6 12/10/2017   MCV 96.4 12/10/2017   PLT 151.0 12/10/2017   Lab Results  Component Value Date   IRON 53 12/10/2017   TIBC 239 (L) 12/10/2017   FERRITIN 31 12/10/2017         Relevant Orders   Iron, TIBC and Ferritin Panel (Completed)   CBC with Differential/Platelet (Completed)      I am having Jenna Marshall maintain her Calcium Carbonate-Vitamin D, multivitamin with minerals, acetaminophen, PRESERVISION AREDS 2, aspirin, senna-docusate, Cholecalciferol, torsemide, meloxicam, pantoprazole, atorvastatin, carvedilol, and levothyroxine.  No orders of the defined types were placed in this encounter.   There  are no discontinued medications.  Follow-up: Return in about 6 months (around 06/12/2018).   [name], MD

## 2017-12-10 NOTE — Patient Instructions (Signed)

## 2017-12-13 NOTE — Assessment & Plan Note (Signed)
S/p 4 vessel CABG for [place].  Normal EF .  She remains asymptomatic and is tolerating medication regimen which includes daily  asa, carvedilol,  And Atorvastatin

## 2017-12-13 NOTE — Assessment & Plan Note (Signed)
Thyroid function is WNL on current dose.  No current changes needed.    Lab Results  Component Value Date   TSH 3.62 12/10/2017

## 2017-12-13 NOTE — Assessment & Plan Note (Signed)
LDL and triglycerides are at goal on atorvastatin.   She has no side effects ,  Has known multivessel CAD,  and liver enzymes are normal. No changes today   Lab Results  Component Value Date   CHOL 131 12/10/2017   HDL 54.50 12/10/2017   LDLCALC 61 12/10/2017   LDLDIRECT 104.4 02/27/2011   TRIG 79.0 12/10/2017   CHOLHDL 2 12/10/2017

## 2017-12-13 NOTE — Assessment & Plan Note (Signed)
Resolved on current regimen of iron supplements EOD.  Lab Results  Component Value Date   WBC 6.9 12/10/2017   HGB 12.4 12/10/2017   HCT 36.6 12/10/2017   MCV 96.4 12/10/2017   PLT 151.0 12/10/2017   Lab Results  Component Value Date   IRON 53 12/10/2017   TIBC 239 (L) 12/10/2017   FERRITIN 31 12/10/2017

## 2017-12-13 NOTE — Assessment & Plan Note (Signed)
Well controlled on current regimen. Renal function stable, no changes today.  Lab Results  Component Value Date   CREATININE 0.97 12/10/2017   Lab Results  Component Value Date   NA 138 12/10/2017   K 4.7 12/10/2017   CL 101 12/10/2017   CO2 32 12/10/2017

## 2017-12-19 ENCOUNTER — Encounter: Payer: Self-pay | Admitting: Internal Medicine

## 2017-12-19 ENCOUNTER — Telehealth: Payer: Self-pay | Admitting: Internal Medicine

## 2017-12-19 ENCOUNTER — Other Ambulatory Visit: Payer: Self-pay | Admitting: Cardiovascular Disease

## 2017-12-19 NOTE — Telephone Encounter (Signed)
error:[id number] ° °

## 2017-12-19 NOTE — Telephone Encounter (Signed)
Copied from [place] [id number]. Topic: General - Other >> Dec 19, 2017 10:47 AM Oneta Rack wrote:  [place], [place], [place] [phone] (Phone) [phone] (Fax)  Reason for call:  Pharmacy requesting 90 day supply of  pantoprazole (PROTONIX) 40 MG tablet  atorvastatin (LIPITOR) 40 MG tablet  levothyroxine (SYNTHROID, LEVOTHROID) 25 MCG tablet   Informed pharmacy please allow 48 to 72 hour turn around.

## 2017-12-22 ENCOUNTER — Other Ambulatory Visit: Payer: Self-pay

## 2017-12-22 ENCOUNTER — Telehealth: Payer: Self-pay

## 2017-12-22 ENCOUNTER — Telehealth: Payer: Self-pay | Admitting: Cardiovascular Disease

## 2017-12-22 MED ORDER — LEVOTHYROXINE SODIUM 25 MCG PO TABS: 25.0000 ug | ORAL_TABLET | Freq: Every day | ORAL | 1 refills | Status: DC

## 2017-12-22 MED ORDER — ATORVASTATIN CALCIUM 40 MG PO TABS: 40.0000 mg | ORAL_TABLET | Freq: Every day | ORAL | 0 refills | Status: DC

## 2017-12-22 MED ORDER — PANTOPRAZOLE SODIUM 40 MG PO TBEC: 40.0000 mg | DELAYED_RELEASE_TABLET | Freq: Every day | ORAL | 1 refills | Status: DC

## 2017-12-22 NOTE — Telephone Encounter (Signed)
°*  STAT* If patient is at the pharmacy, call can be transferred to refill team.   1. Which medications need to be refilled? (please list name of each medication and dose if known) Atorvastatin   2. Which pharmacy/location (including street and city if local pharmacy) is medication to be sent to?[place]  3. Do they need a 30 day or 90 day supply? Pt is asking for 90 day

## 2017-12-22 NOTE — Telephone Encounter (Signed)
Patient called to discuss her refill requests, she ask if I would call her guardian to discuss. I called the guardian and left a VM to return call to the office to discuss. I called [employer] and spoke to [employer], Merchant navy officer about the medication refill request. Joseph Art says they were requesting the refill early to place on the file. I advised to call the cardiologist for the Atorvastain refill request, she verbalized understanding. I called the patient back to let her know everything is taken care of at the pharmacy about her medications, she verbalized understanding.

## 2017-12-22 NOTE — Telephone Encounter (Signed)
90 day refill sent   atorvastatin (LIPITOR) 40 MG tablet 90 tablet 0 12/22/2017    Sig - Route: Take 1 tablet (40 mg total) by mouth daily. - Oral   Sent to pharmacy as: atorvastatin (LIPITOR) 40 MG tablet   Notes to Pharmacy: PATIENT WANTS 90 [place] !!!!! THANKS.   E-Prescribing Status: Sent to pharmacy (12/22/2017 3:20 PM EDT)   [place], [place]

## 2017-12-22 NOTE — Telephone Encounter (Addendum)
FYI Daughter [name] called  wanted to let you know that patient is on Slow Fe. Iron every qod

## 2017-12-22 NOTE — Telephone Encounter (Signed)
Jenna Marshall can reduce her iron to every other day based on recent las.

## 2017-12-23 NOTE — Telephone Encounter (Signed)
Left voice mail for patient to call back ok for [facility] to speak to patient    

## 2018-01-07 NOTE — Telephone Encounter (Signed)
Mychart message sent.

## 2018-01-18 ENCOUNTER — Other Ambulatory Visit: Payer: Self-pay | Admitting: Internal Medicine

## 2018-02-03 ENCOUNTER — Ambulatory Visit: Payer: Self-pay | Admitting: Nurse Practitioner

## 2018-02-10 ENCOUNTER — Encounter: Payer: Self-pay | Admitting: Nurse Practitioner

## 2018-02-10 ENCOUNTER — Ambulatory Visit: Payer: Medicare HMO | Admitting: Nurse Practitioner

## 2018-02-10 VITALS — BP 126/74 | Ht 61.0 in | Wt 177.5 lb

## 2018-02-10 DIAGNOSIS — I2510 Atherosclerotic heart disease of native coronary artery without angina pectoris: Secondary | ICD-10-CM | POA: Diagnosis not present

## 2018-02-10 DIAGNOSIS — I351 Nonrheumatic aortic (valve) insufficiency: Secondary | ICD-10-CM

## 2018-02-10 DIAGNOSIS — E785 Hyperlipidemia, unspecified: Secondary | ICD-10-CM

## 2018-02-10 DIAGNOSIS — I10 Essential (primary) hypertension: Secondary | ICD-10-CM

## 2018-02-10 NOTE — Progress Notes (Signed)
Office Visit    Patient Name: Jenna Marshall Date of Encounter: 02/10/2018  Primary Care Provider:  [name], MD Primary Cardiologist:  [name], MD  Chief Complaint    84 y/o ? who presents for follow-up today related to her history of CAD status post CABG, hypertension, hyperlipidemia, hypothyroidism, GERD, and aortic insufficiency.  Past Medical History    Past Medical History:  Diagnosis Date  . Aortic insufficiency    a. 03/2017 Echo: Mod AI.  Marland Kitchen Arthritis   . CAD (coronary artery disease)    a. Lexiscan 10/13/14: mid anterior to apical & inf wall ischemia w/ WMA, mild to mod dep EF; b. Cath 10/2014: LM 50ost, LM 99, ost LAD 95, 40p/m, RCA 30p, 51m; b. 10/2014 CABG x 4 (LIMA->LAD, VG->RCA, VG->RI->OM).  . Depression   . Diastolic dysfunction    a. 10/2014 Echo: EF 55-60%, no RWMA, GR1DD, mild AI, trivial MR, mildly dilated LA, PASP normal; b. 03/2017 Echo: EF 60-65%, mild LVH, Gr2 DD, mod AI, mild MR, mildly dil LA, nl RV fxn, mildly dil RA, mild to mod TR.  Marland Kitchen GERD (gastroesophageal reflux disease)   . Hyperlipidemia   . Hypertension   . Hypothyroidism   . Macular degeneration   . pernicious anemia   . Varicose veins   . Wears dentures    partial upper  . Wears hearing aid    right   Past Surgical History:  Procedure Laterality Date  . ANTERIOR VITRECTOMY Right 07/16/2016   Procedure: ANTERIOR VITRECTOMY;  Surgeon: Eulogio Bear, MD;  Location: [facility];  Service: Ophthalmology;  Laterality: Right;  . CARDIAC CATHETERIZATION Left 10/14/2014   Procedure: Left Heart Cath and Coronary Angiography;  Surgeon: Wellington Hampshire, MD;  Location: [facility];  Service: Cardiovascular;  Laterality: Left;  . CATARACT EXTRACTION W/PHACO Left 04/30/2016   Procedure: CATARACT EXTRACTION PHACO AND INTRAOCULAR LENS PLACEMENT (IOC);  Surgeon: Eulogio Bear, MD;  Location: [facility];  Service: Ophthalmology;  Laterality: Left;  LEFT  .  CATARACT EXTRACTION W/PHACO Right 07/16/2016   Procedure: CATARACT EXTRACTION PHACO AND INTRAOCULAR LENS PLACEMENT ([place])  Right;  Surgeon: Eulogio Bear, MD;  Location: [facility];  Service: Ophthalmology;  Laterality: Right;  . CORONARY ARTERY BYPASS GRAFT N/A 10/14/2014   Procedure: CORONARY ARTERY BYPASS GRAFTING (CABG), ON PUMP, TIMES FOUR, USING LEFT INTERNAL MAMMARY ARTERY, RIGHT GREATER SAPHENOUS VEIN HARVESTED ENDOSCOPICALLY;  Surgeon: Gaye Pollack, MD;  Location: [facility];  Service: Open Heart Surgery;  Laterality: N/A;  . CORONARY ARTERY BYPASS GRAFT  10/2014   LIMA-->LAD, SVG-->RCA, sequential SVG-->[place] and OM  . HEMORRHOID SURGERY    . HIP ARTHROPLASTY Left 01/14/2015   Procedure: ARTHROPLASTY BIPOLAR HIP (HEMIARTHROPLASTY);  Surgeon: Dereck Leep, MD;  Location: [facility];  Service: Orthopedics;  Laterality: Left;  Marland Kitchen VARICOSE VEIN SURGERY      Allergies  Allergies  Allergen Reactions  . Ambien [Zolpidem] Other (See Comments)    Reaction:  Hallucinations  . Codeine Nausea And Vomiting  . Lasix [Furosemide] Itching and Rash    History of Present Illness    84 year old female with the above complex past medical history including coronary artery disease, hypertension, hyperlipidemia, GERD, hypothyroidism, diastolic dysfunction, aortic insufficiency, depression, and arthritis.  In May 2016, she underwent stress testing in the setting of chest discomfort which was abnormal.  This is followed by catheterization which showed severe left main and ostial LAD disease.  She  subsequently underwent CABG x4.  3 months after bypass, she fractured her left hip and required left hip hemiarthroplasty.  Though she was slow to recover after hip surgery, over the past year, she has been doing exceptionally well.  She was last seen in October 2018.  Echocardiogram following that visit showed normal LV function with grade 2 diastolic dysfunction, and moderate AI and mild MR.  She mostly  ambulates with a walker now due to what she describes as a weakness in her legs which has been a problem for many years.  She does not experience claudication.  She denies chest pain, dyspnea, palpitations, PND, orthopnea, dizziness, syncope, edema, or early satiety.  Home Medications    Prior to Admission medications   Medication Sig Start Date End Date Taking? Authorizing Provider  acetaminophen (TYLENOL) 325 MG tablet Take 2 tablets (650 mg total) by mouth every 6 (six) hours as needed for mild pain. 10/21/14  Yes Lars Pinks M, PA-C  aspirin 81 MG chewable tablet Chew 1 tablet (81 mg total) by mouth daily. 01/19/15  Yes Gladstone Lighter, MD  atorvastatin (LIPITOR) 40 MG tablet Take 1 tablet (40 mg total) by mouth daily. 12/22/17  Yes Wellington Hampshire, MD  Calcium Carbonate-Vitamin D (CALCIUM + D) 600-200 MG-UNIT TABS Take 1 tablet by mouth 2 (two) times daily.    Yes [provider]  carvedilol (COREG) 3.125 MG tablet TAKE ONE TABLET BY MOUTH TWICE DAILY WITH MEALS 11/28/17  Yes [name], MD  Cholecalciferol (D3 DOTS) 2000 UNITS TBDP Take 2,000 Units by mouth.   Yes [provider]  Ferrous Sulfate 142 (45 Fe) MG TBCR Take 1 tablet by mouth every other day.   Yes [provider]  levothyroxine (SYNTHROID, LEVOTHROID) 25 MCG tablet Take 1 tablet (25 mcg total) by mouth daily with breakfast. 12/22/17  Yes [name], MD  Multiple Vitamin (MULTIVITAMIN WITH MINERALS) TABS tablet Take 1 tablet by mouth daily.   Yes [provider]  Multiple Vitamins-Minerals (PRESERVISION AREDS 2) CAPS Take 1 capsule by mouth 2 (two) times daily.    Yes [provider]  pantoprazole (PROTONIX) 40 MG tablet Take 1 tablet (40 mg total) by mouth daily. 12/22/17  Yes [name], MD  pantoprazole (PROTONIX) 40 MG tablet TAKE ONE TABLET BY MOUTH EVERY DAY 01/20/18  Yes [name], MD  senna-docusate (SENOKOT-S) 8.6-50 MG per tablet Take 1 tablet by  mouth 2 (two) times daily. 01/19/15  Yes Gladstone Lighter, MD  torsemide (DEMADEX) 10 MG tablet As needed for overnight weight gain of 2 lbs 09/22/15  Yes [name], MD  citalopram (CELEXA) 20 MG tablet Take 20 mg by mouth daily.    08/28/11  [provider]    Review of Systems    She denies chest pain, palpitations, dyspnea, pnd, orthopnea, n, v, dizziness, syncope, edema, weight gain, or early satiety.  Ambulation somewhat limited by unsteadiness.  She is using a walker.  All other systems reviewed and are otherwise negative except as noted above.  Physical Exam    VS:  BP 126/74 (BP Location: Right Arm, Patient Position: Sitting, Cuff Size: Large)   Ht 5\' 1"  (1.549 m)   Wt 177 lb 8 oz (80.5 kg)   BMI 33.54 kg/m  , BMI Body mass index is 33.54 kg/m. GEN: Well nourished, well developed, in no acute distress. HEENT: normal. Neck: Supple, no JVD, carotid bruits, or masses. Cardiac: RRR, 2/6 systolic murmur at  the upper sternal borders but heard throughout, no rubs, or gallops. No clubbing, cyanosis, minimal nonpitting right lower extremity edema which she says is chronic since bypass.  Radials/DP/PT 2+ and equal bilaterally.  Respiratory:  Respirations regular and unlabored, clear to auscultation bilaterally. GI: Soft, nontender, nondistended, BS + x 4. MS: no deformity or atrophy. Skin: warm and dry, no rash. Neuro:  Strength and sensation are intact. Psych: Normal affect.  Accessory Clinical Findings    ECG personally reviewed by me today -regular sinus rhythm, 65, left axis deviation, borderline left atrial enlargement, septal infarct, nonspecific T changes- no acute changes.  Assessment & Plan    1.  Coronary artery disease: Status post CABG x4 in 2016.  She has been doing well since her last visit in October 2018.  She does not experience chest pain or dyspnea.  Activity somewhat limited by unsteady gait but she does get around as much she can using a walker.  She  remains on aspirin, statin, beta-blocker.  2.  Essential hypertension: Stable.  3.  Hyperlipidemia: She had lipids in July, with an LDL of 61.  LFTs normal at that time.  She remains on atorvastatin 40 mg.  4.  Moderate aortic insufficiency: Relatively stable by echocardiogram in October 2018.  Mild to moderate AI had previously been noted on TEE in May 2016 at the time of her bypass.  5.  Disposition: Follow-up in 6 months or sooner if necessary.  Murray Hodgkins, NP 02/10/2018, 12:56 PM

## 2018-02-10 NOTE — Patient Instructions (Signed)
Medication Instructions:  Your physician recommends that you continue on your current medications as directed. Please refer to the Current Medication list given to you today.   Labwork: NONE  Testing/Procedures: NONE  Follow-Up: Your physician recommends that you schedule a follow-up appointment in: [place].   If you need a refill on your cardiac medications before your next appointment, please call your pharmacy.

## 2018-03-12 ENCOUNTER — Other Ambulatory Visit: Payer: Self-pay | Admitting: Internal Medicine

## 2018-03-22 ENCOUNTER — Other Ambulatory Visit: Payer: Self-pay | Admitting: Cardiovascular Disease

## 2018-03-23 ENCOUNTER — Other Ambulatory Visit: Payer: Self-pay | Admitting: Internal Medicine

## 2018-03-23 ENCOUNTER — Other Ambulatory Visit: Payer: Self-pay | Admitting: *Deleted

## 2018-03-23 ENCOUNTER — Other Ambulatory Visit: Payer: Self-pay

## 2018-03-23 MED ORDER — ATORVASTATIN CALCIUM 40 MG PO TABS: 40.0000 mg | ORAL_TABLET | Freq: Every day | ORAL | 2 refills | Status: DC

## 2018-03-23 MED ORDER — ATORVASTATIN CALCIUM 40 MG PO TABS: 40.0000 mg | ORAL_TABLET | Freq: Every day | ORAL | 3 refills | Status: DC

## 2018-03-23 NOTE — Telephone Encounter (Signed)
Copied from [place] [id number]. Topic: Quick Communication - See Telephone Encounter >> Mar 23, 2018  9:36 AM Hewitt Shorts wrote: PT pharmacy is calling that the patient is needing a 90 day refill on [place] [phone]

## 2018-03-23 NOTE — Telephone Encounter (Signed)
Pharmacy is asking for a 90 day supply.

## 2018-03-24 MED ORDER — CARVEDILOL 3.125 MG PO TABS: 3.1250 mg | ORAL_TABLET | Freq: Two times a day (BID) | ORAL | 1 refills | Status: DC

## 2018-03-24 NOTE — Telephone Encounter (Signed)
Requested Prescriptions  Pending Prescriptions Disp Refills  . carvedilol (COREG) 3.125 MG tablet 180 tablet 1    Sig: Take 1 tablet (3.125 mg total) by mouth 2 (two) times daily with a meal.     Cardiovascular:  Beta Blockers Passed - 03/23/2018 10:10 AM      Passed - Last BP in normal range    BP Readings from Last 1 Encounters:  02/10/18 126/74         Passed - Last Heart Rate in normal range    Pulse Readings from Last 1 Encounters:  12/10/17 63         Passed - Valid encounter within last 6 months    Recent Outpatient Visits          3 months ago Hypothyroidism due to acquired atrophy of thyroid   [facility] [name], MD   9 months ago Visit for preventive health examination   [place] [name], MD   1 year ago Hypothyroidism due to acquired atrophy of thyroid   [facility] [place] [name], MD   1 year ago Visit for preventive health examination   [facility] [place] [name], MD   2 years ago Hyperlipidemia   [facility] [place], [place], MD      Future Appointments            In 2 months Derrel Nip, Aris Everts, MD [place], [place]   In 6 months O'Brien-Blaney, Bryson Corona, LPN [place], [place]

## 2018-03-25 DIAGNOSIS — H353132 Nonexudative age-related macular degeneration, bilateral, intermediate dry stage: Secondary | ICD-10-CM | POA: Diagnosis not present

## 2018-03-30 DIAGNOSIS — H353221 Exudative age-related macular degeneration, left eye, with active choroidal neovascularization: Secondary | ICD-10-CM | POA: Diagnosis not present

## 2018-03-30 DIAGNOSIS — H353211 Exudative age-related macular degeneration, right eye, with active choroidal neovascularization: Secondary | ICD-10-CM | POA: Diagnosis not present

## 2018-04-21 DIAGNOSIS — H353221 Exudative age-related macular degeneration, left eye, with active choroidal neovascularization: Secondary | ICD-10-CM | POA: Diagnosis not present

## 2018-05-19 DIAGNOSIS — H353211 Exudative age-related macular degeneration, right eye, with active choroidal neovascularization: Secondary | ICD-10-CM | POA: Diagnosis not present

## 2018-06-10 ENCOUNTER — Other Ambulatory Visit: Payer: Self-pay | Admitting: Internal Medicine

## 2018-06-17 ENCOUNTER — Ambulatory Visit (INDEPENDENT_AMBULATORY_CARE_PROVIDER_SITE_OTHER): Payer: Medicare HMO | Admitting: Internal Medicine

## 2018-06-17 ENCOUNTER — Encounter: Payer: Self-pay | Admitting: Internal Medicine

## 2018-06-17 VITALS — BP 138/70 | HR 67 | Temp 97.9°F | Resp 16 | Ht 61.0 in | Wt 176.8 lb

## 2018-06-17 DIAGNOSIS — E034 Atrophy of thyroid (acquired): Secondary | ICD-10-CM

## 2018-06-17 DIAGNOSIS — Z0000 Encounter for general adult medical examination without abnormal findings: Secondary | ICD-10-CM | POA: Diagnosis not present

## 2018-06-17 DIAGNOSIS — Z79899 Other long term (current) drug therapy: Secondary | ICD-10-CM

## 2018-06-17 DIAGNOSIS — D508 Other iron deficiency anemias: Secondary | ICD-10-CM

## 2018-06-17 DIAGNOSIS — E7800 Pure hypercholesterolemia, unspecified: Secondary | ICD-10-CM | POA: Diagnosis not present

## 2018-06-17 LAB — LIPID PANEL
CHOL/HDL RATIO: 3
Cholesterol: 134 mg/dL (ref 0–200)
HDL: 50.1 mg/dL (ref >39.00)
LDL Cholesterol: 58 mg/dL (ref 0–99)
NONHDL: 83.77
TRIGLYCERIDES: 130 mg/dL (ref 0.0–149.0)
VLDL: 26 mg/dL (ref 0.0–40.0)

## 2018-06-17 LAB — CBC WITH DIFFERENTIAL/PLATELET
BASOS PCT: 0.8 % (ref 0.0–3.0)
Basophils Absolute: 0.1 10*3/uL (ref 0.0–0.1)
EOS ABS: 0.2 10*3/uL (ref 0.0–0.7)
Eosinophils Relative: 2.5 % (ref 0.0–5.0)
HCT: 38.2 % (ref 36.0–46.0)
Hemoglobin: 13 g/dL (ref 12.0–15.0)
LYMPHS ABS: 1.3 10*3/uL (ref 0.7–4.0)
Lymphocytes Relative: 14.8 % (ref 12.0–46.0)
MCHC: 34.1 g/dL (ref 30.0–36.0)
MCV: 96.2 fl (ref 78.0–100.0)
MONO ABS: 0.8 10*3/uL (ref 0.1–1.0)
Monocytes Relative: 9 % (ref 3.0–12.0)
NEUTROS ABS: 6.3 10*3/uL (ref 1.4–7.7)
NEUTROS PCT: 72.9 % (ref 43.0–77.0)
PLATELETS: 163 10*3/uL (ref 150.0–400.0)
RBC: 3.97 Mil/uL (ref 3.87–5.11)
RDW: 13.4 % (ref 11.5–15.5)
WBC: 8.6 10*3/uL (ref 4.0–10.5)

## 2018-06-17 LAB — COMPREHENSIVE METABOLIC PANEL
ALK PHOS: 54 U/L (ref 39–117)
ALT: 12 U/L (ref 0–35)
AST: 17 U/L (ref 0–37)
Albumin: 4.2 g/dL (ref 3.5–5.2)
BILIRUBIN TOTAL: 0.5 mg/dL (ref 0.2–1.2)
BUN: 22 mg/dL (ref 6–23)
CALCIUM: 9.8 mg/dL (ref 8.4–10.5)
CO2: 31 mEq/L (ref 19–32)
Chloride: 96 mEq/L (ref 96–112)
Creatinine, Ser: 0.87 mg/dL (ref 0.40–1.20)
GFR: 64.94 mL/min (ref >60.00)
GLUCOSE: 97 mg/dL (ref 70–99)
POTASSIUM: 4.4 meq/L (ref 3.5–5.1)
Sodium: 132 mEq/L — ABNORMAL LOW (ref 135–145)
TOTAL PROTEIN: 7.1 g/dL (ref 6.0–8.3)

## 2018-06-17 LAB — IRON,TIBC AND FERRITIN PANEL
%SAT: 30 % (calc) (ref 16–45)
Ferritin: 59 ng/mL (ref 16–288)
Iron: 64 ug/dL (ref 45–160)
TIBC: 212 mcg/dL (calc) — ABNORMAL LOW (ref 250–450)

## 2018-06-17 LAB — TSH: TSH: 4.24 u[IU]/mL (ref 0.35–4.50)

## 2018-06-17 MED ORDER — ZOSTER VAC RECOMB ADJUVANTED 50 MCG/0.5ML IM SUSR: 0.5000 mL | Freq: Once | INTRAMUSCULAR | 1 refills | Status: AC

## 2018-06-17 NOTE — Patient Instructions (Addendum)
The ShingRx vaccine is now available in local pharmacies and is much more protective than the old one  Zostavax  (it is about 97%  Effective in preventing shingles). .   It is therefore ADVISED for all interested adults over 50 to prevent shingles so I have printed you a prescription for it.  (it requires a 2nd dose 2 too 6 months after the first one) .  It will cause you to have flu  like symptoms for 2 days   You are starting to develop "the hump" in your upper back from poor posture  Please do the exercise I demonstrated to you today DAILY:  Roll your shoulder frontwards and backwards . 10 times ,  3 sets Add 5 lb wight to each hand when it gets easy  Stand on one leg for 10 seconds, near the kitchen counter to work on your balance.  Try to use only one finger on the counter 5 times each leg   3 sets   Health Maintenance for Postmenopausal Women Menopause is a normal process in which your reproductive ability comes to an end. This process happens gradually over a span of months to years, usually between the ages of 31 and 61. Menopause is complete when you have missed 12 consecutive menstrual periods. It is important to talk with your health care provider about some of the most common conditions that affect postmenopausal women, such as heart disease, cancer, and bone loss (osteoporosis). Adopting a healthy lifestyle and getting preventive care can help to promote your health and wellness. Those actions can also lower your chances of developing some of these common conditions. What should I know about menopause? During menopause, you may experience a number of symptoms, such as:  Moderate-to-severe hot flashes.  Night sweats.  Decrease in sex drive.  Mood swings.  Headaches.  Tiredness.  Irritability.  Memory problems.  Insomnia. Choosing to treat or not to treat menopausal changes is an individual decision that you make with your health care provider. What should I know  about hormone replacement therapy and supplements? Hormone therapy products are effective for treating symptoms that are associated with menopause, such as hot flashes and night sweats. Hormone replacement carries certain risks, especially as you become older. If you are thinking about using estrogen or estrogen with progestin treatments, discuss the benefits and risks with your health care provider. What should I know about heart disease and stroke? Heart disease, heart attack, and stroke become more likely as you age. This may be due, in part, to the hormonal changes that your body experiences during menopause. These can affect how your body processes dietary fats, triglycerides, and cholesterol. Heart attack and stroke are both medical emergencies. There are many things that you can do to help prevent heart disease and stroke:  Have your blood pressure checked at least every 1-2 years. High blood pressure causes heart disease and increases the risk of stroke.  If you are 56-68 years old, ask your health care provider if you should take aspirin to prevent a heart attack or a stroke.  Do not use any tobacco products, including cigarettes, chewing tobacco, or electronic cigarettes. If you need help quitting, ask your health care provider.  It is important to eat a healthy diet and maintain a healthy weight. ? Be sure to include plenty of vegetables, fruits, low-fat dairy products, and lean protein. ? Avoid eating foods that are high in solid fats, added sugars, or salt (sodium).  Get  regular exercise. This is one of the most important things that you can do for your health. ? Try to exercise for at least 150 minutes each week. The type of exercise that you do should increase your heart rate and make you sweat. This is known as moderate-intensity exercise. ? Try to do strengthening exercises at least twice each week. Do these in addition to the moderate-intensity exercise.  Know your numbers.Ask  your health care provider to check your cholesterol and your blood glucose. Continue to have your blood tested as directed by your health care provider.  What should I know about cancer screening? There are several types of cancer. Take the following steps to reduce your risk and to catch any cancer development as early as possible. Breast Cancer  Practice breast self-awareness. ? This means understanding how your breasts normally appear and feel. ? It also means doing regular breast self-exams. Let your health care provider know about any changes, no matter how small.  If you are 79 or older, have a clinician do a breast exam (clinical breast exam or CBE) every year. Depending on your age, family history, and medical history, it may be recommended that you also have a yearly breast X-ray (mammogram).  If you have a family history of breast cancer, talk with your health care provider about genetic screening.  If you are at high risk for breast cancer, talk with your health care provider about having an MRI and a mammogram every year.  Breast cancer (BRCA) gene test is recommended for women who have family members with BRCA-related cancers. Results of the assessment will determine the need for genetic counseling and BRCA1 and for BRCA2 testing. BRCA-related cancers include these types: ? Breast. This occurs in males or females. ? Ovarian. ? Tubal. This may also be called fallopian tube cancer. ? Cancer of the abdominal or pelvic lining (peritoneal cancer). ? Prostate. ? Pancreatic. Cervical, Uterine, and Ovarian Cancer Your health care provider may recommend that you be screened regularly for cancer of the pelvic organs. These include your ovaries, uterus, and vagina. This screening involves a pelvic exam, which includes checking for microscopic changes to the surface of your cervix (Pap test).  For women ages 21-65, health care providers may recommend a pelvic exam and a Pap test every  three years. For women ages 20-65, they may recommend the Pap test and pelvic exam, combined with testing for human papilloma virus (HPV), every five years. Some types of HPV increase your risk of cervical cancer. Testing for HPV may also be done on women of any age who have unclear Pap test results.  Other health care providers may not recommend any screening for nonpregnant women who are considered low risk for pelvic cancer and have no symptoms. Ask your health care provider if a screening pelvic exam is right for you.  If you have had past treatment for cervical cancer or a condition that could lead to cancer, you need Pap tests and screening for cancer for at least 20 years after your treatment. If Pap tests have been discontinued for you, your risk factors (such as having a new sexual partner) need to be reassessed to determine if you should start having screenings again. Some women have medical problems that increase the chance of getting cervical cancer. In these cases, your health care provider may recommend that you have screening and Pap tests more often.  If you have a family history of uterine cancer or ovarian cancer, talk  with your health care provider about genetic screening.  If you have vaginal bleeding after reaching menopause, tell your health care provider.  There are currently no reliable tests available to screen for ovarian cancer. Lung Cancer Lung cancer screening is recommended for adults 8-4 years old who are at high risk for lung cancer because of a history of smoking. A yearly low-dose CT scan of the lungs is recommended if you:  Currently smoke.  Have a history of at least 30 pack-years of smoking and you currently smoke or have quit within the past 15 years. A pack-year is smoking an average of one pack of cigarettes per day for one year. Yearly screening should:  Continue until it has been 15 years since you quit.  Stop if you develop a health problem that would  prevent you from having lung cancer treatment. Colorectal Cancer  This type of cancer can be detected and can often be prevented.  Routine colorectal cancer screening usually begins at age 18 and continues through age 36.  If you have risk factors for colon cancer, your health care provider may recommend that you be screened at an earlier age.  If you have a family history of colorectal cancer, talk with your health care provider about genetic screening.  Your health care provider may also recommend using home test kits to check for hidden blood in your stool.  A small camera at the end of a tube can be used to examine your colon directly (sigmoidoscopy or colonoscopy). This is done to check for the earliest forms of colorectal cancer.  Direct examination of the colon should be repeated every 5-10 years until age 28. However, if early forms of precancerous polyps or small growths are found or if you have a family history or genetic risk for colorectal cancer, you may need to be screened more often. Skin Cancer  Check your skin from head to toe regularly.  Monitor any moles. Be sure to tell your health care provider: ? About any new moles or changes in moles, especially if there is a change in a mole's shape or color. ? If you have a mole that is larger than the size of a pencil eraser.  If any of your family members has a history of skin cancer, especially at a young age, talk with your health care provider about genetic screening.  Always use sunscreen. Apply sunscreen liberally and repeatedly throughout the day.  Whenever you are outside, protect yourself by wearing long sleeves, pants, a wide-brimmed hat, and sunglasses. What should I know about osteoporosis? Osteoporosis is a condition in which bone destruction happens more quickly than new bone creation. After menopause, you may be at an increased risk for osteoporosis. To help prevent osteoporosis or the bone fractures that can  happen because of osteoporosis, the following is recommended:  If you are 19-47 years old, get at least 1,000 mg of calcium and at least 600 mg of vitamin D per day.  If you are older than age 32 but younger than age 18, get at least 1,200 mg of calcium and at least 600 mg of vitamin D per day.  If you are older than age 80, get at least 1,200 mg of calcium and at least 800 mg of vitamin D per day. Smoking and excessive alcohol intake increase the risk of osteoporosis. Eat foods that are rich in calcium and vitamin D, and do weight-bearing exercises several times each week as directed by your health care  provider. What should I know about how menopause affects my mental health? Depression may occur at any age, but it is more common as you become older. Common symptoms of depression include:  Low or sad mood.  Changes in sleep patterns.  Changes in appetite or eating patterns.  Feeling an overall lack of motivation or enjoyment of activities that you previously enjoyed.  Frequent crying spells. Talk with your health care provider if you think that you are experiencing depression. What should I know about immunizations? It is important that you get and maintain your immunizations. These include:  Tetanus, diphtheria, and pertussis (Tdap) booster vaccine.  Influenza every year before the flu season begins.  Pneumonia vaccine.  Shingles vaccine. Your health care provider may also recommend other immunizations. This information is not intended to replace advice given to you by your health care provider. Make sure you discuss any questions you have with your health care provider. Document Released: 07/12/2005 Document Revised: 12/08/2015 Document Reviewed: 02/21/2015 Elsevier Interactive Patient Education  2019 [employer].

## 2018-06-17 NOTE — Progress Notes (Signed)
Patient ID: Jenna Marshall, female    DOB: 1925-07-20  Age: 85 y.o. MRN: [id number]  The patient is here for annual preventive examination and management of other chronic and acute problems. She is accompanied by her daughter.    The risk factors are reflected in the social history.  The roster of all physicians providing medical care to patient - is listed in the Snapshot section of the chart.  Activities of daily living:  The patient is 100% independent in all ADLs: dressing, toileting, feeding as well as independent mobility.  She is using a rolling walker.   Home safety : The patient has smoke detectors in the home. They wear seatbelts.  There are no firearms at home. There is no violence in the home.   There is no risks for hepatitis, STDs or HIV. There is no   history of blood transfusion. They have no travel history to infectious disease endemic areas of the world.  The patient has seen their dentist in the last six month. They have seen their eye doctor in the last year. They admit to slight hearing difficulty with regard to whispered voices and some television programs.  They have deferred audiologic testing in the last year.  They do not  have excessive sun exposure. Discussed the need for sun protection: hats, long sleeves and use of sunscreen if there is significant sun exposure.   Diet: the importance of a healthy diet is discussed. They do have a healthy diet.  The benefits of regular aerobic exercise were discussed. She walks 4 times per week in her home for about 10 minutes .   Depression screen: there are no signs or vegative symptoms of depression- irritability, change in appetite, anhedonia, sadness/tearfullness.  Cognitive assessment: the patient manages all their financial and personal affairs and is actively engaged. They could relate day,date,year and events; recalled 2/3 objects at 3 minutes; performed clock-face test normally.  The following portions of the patient's  history were reviewed and updated as appropriate: allergies, current medications, past family history, past medical history,  past surgical history, past social history  and problem list.  Visual acuity was not assessed per patient preference since she has regular follow up with her ophthalmologist. Hearing and body mass index were assessed and reviewed.   During the course of the visit the patient was educated and counseled about appropriate screening and preventive services including : fall prevention , diabetes screening, nutrition counseling, colorectal cancer screening, and recommended immunizations.    CC: The primary encounter diagnosis was Pure hypercholesterolemia. Diagnoses of Other iron deficiency anemia, Hypothyroidism due to acquired atrophy of thyroid, Long-term use of high-risk medication, and Visit for preventive health examination were also pertinent to this visit.  "I feel great."   History Rheba has a past medical history of Aortic insufficiency, Arthritis, CAD (coronary artery disease), Depression, Diastolic dysfunction, GERD (gastroesophageal reflux disease), Hyperlipidemia, Hypertension, Hypothyroidism, Macular degeneration, pernicious anemia, Varicose veins, Wears dentures, and Wears hearing aid.   She has a past surgical history that includes Cardiac catheterization (Left, 10/14/2014); Coronary artery bypass graft (N/A, 10/14/2014); Coronary artery bypass graft (10/2014); Varicose vein surgery; Hemorrhoid surgery; Hip Arthroplasty (Left, 01/14/2015); Cataract extraction w/PHACO (Left, 04/30/2016); Cataract extraction w/PHACO (Right, 07/16/2016); and Anterior vitrectomy (Right, 07/16/2016).   Her family history includes Coronary artery disease in her brother and mother; Diabetes in her mother; Heart disease in her father.She reports that she has never smoked. She has never used smokeless tobacco. She reports that she  does not drink alcohol or use drugs.  Outpatient Medications Prior  to Visit  Medication Sig Dispense Refill  . acetaminophen (TYLENOL) 325 MG tablet Take 2 tablets (650 mg total) by mouth every 6 (six) hours as needed for mild pain.    Marland Kitchen aspirin 81 MG chewable tablet Chew 1 tablet (81 mg total) by mouth daily. 30 tablet 0  . atorvastatin (LIPITOR) 40 MG tablet Take 1 tablet (40 mg total) by mouth daily. 90 tablet 2  . Calcium Carbonate-Vitamin D (CALCIUM + D) 600-200 MG-UNIT TABS Take 1 tablet by mouth 2 (two) times daily.     . carvedilol (COREG) 3.125 MG tablet Take 1 tablet (3.125 mg total) by mouth 2 (two) times daily with a meal. 180 tablet 1  . Cholecalciferol (D3 DOTS) 2000 UNITS TBDP Take 2,000 Units by mouth.    . Ferrous Sulfate 142 (45 Fe) MG TBCR Take 1 tablet by mouth every other day.    . levothyroxine (SYNTHROID, LEVOTHROID) 25 MCG tablet Take 1 tablet (25 mcg total) by mouth daily with breakfast. 90 tablet 1  . levothyroxine (SYNTHROID, LEVOTHROID) 25 MCG tablet TAKE ONE TABLET BY MOUTH EVERY MORNING WITH BREAKFAST 90 tablet 1  . Multiple Vitamin (MULTIVITAMIN WITH MINERALS) TABS tablet Take 1 tablet by mouth daily.    . Multiple Vitamins-Minerals (PRESERVISION AREDS 2) CAPS Take 1 capsule by mouth 2 (two) times daily.     . pantoprazole (PROTONIX) 40 MG tablet Take 1 tablet (40 mg total) by mouth daily. 90 tablet 1  . pantoprazole (PROTONIX) 40 MG tablet TAKE ONE TABLET BY MOUTH EVERY DAY 90 tablet 1  . senna-docusate (SENOKOT-S) 8.6-50 MG per tablet Take 1 tablet by mouth 2 (two) times daily. 30 tablet 0  . torsemide (DEMADEX) 10 MG tablet As needed for overnight weight gain of 2 lbs 30 tablet 3   No facility-administered medications prior to visit.     Review of Systems   Patient denies headache, fevers, malaise, unintentional weight loss, skin rash, eye pain, sinus congestion and sinus pain, sore throat, dysphagia,  hemoptysis , cough, dyspnea, wheezing, chest pain, palpitations, orthopnea, edema, abdominal pain, nausea, melena, diarrhea,  constipation, flank pain, dysuria, hematuria, urinary  Frequency, nocturia, numbness, tingling, seizures,  Focal weakness, Loss of consciousness,  Tremor, insomnia, depression, anxiety, and suicidal ideation.      Objective:  BP 138/70 (BP Location: Left Arm, Patient Position: Sitting, Cuff Size: Large)   Pulse 67   Temp 97.9 F (36.6 C) (Oral)   Resp 16   Ht 5\' 1"  (1.549 m)   Wt 176 lb 12.8 oz (80.2 kg)   SpO2 98%   BMI 33.41 kg/m   Physical Exam    General appearance: alert, cooperative and appears stated age Ears: normal TM's and external ear canals both ears Throat: lips, mucosa, and tongue normal; teeth and gums normal Neck: no adenopathy, no carotid bruit, supple, symmetrical, trachea midline and thyroid not enlarged, symmetric, no tenderness/mass/nodules Back: symmetric, no curvature. ROM normal. No CVA tenderness. Lungs: clear to auscultation bilaterally Heart: regular rate and rhythm, S1, S2 normal, no murmur, click, rub or gallop Abdomen: soft, non-tender; bowel sounds normal; no masses,  no organomegaly Pulses: 2+ and symmetric Skin: Skin color, texture, turgor normal. No rashes or lesions Lymph nodes: Cervical, supraclavicular, and axillary nodes normal.    Assessment & Plan:   Problem List Items Addressed This Visit    Hyperlipidemia - Primary (Chronic)   Relevant Orders   Lipid panel (  Completed)   Hypothyroidism (Chronic)   Relevant Orders   TSH (Completed)   Iron deficiency anemia   Relevant Orders   CBC with Differential/Platelet (Completed)   Iron, TIBC and Ferritin Panel (Completed)   Long-term use of high-risk medication   Relevant Orders   Comprehensive metabolic panel (Completed)   Visit for preventive health examination    age appropriate education and counseling updated, referrals for preventative services and immunizations addressed, dietary and smoking counseling addressed, most recent labs reviewed.  I have personally reviewed and have  noted:  1) the patient's medical and social history 2) The pt's use of alcohol, tobacco, and illicit drugs 3) The patient's current medications and supplements 4) Functional ability including ADL's, fall risk, home safety risk, hearing and visual impairment 5) Diet and physical activities 6) Evidence for depression or mood disorder 7) The patient's height, weight, and BMI have been recorded in the chart  I have made referrals, and provided counseling and education based on review of the above         I am having Zoiee K. Spindle start on Zoster Vaccine Adjuvanted. I am also having her maintain her Calcium Carbonate-Vitamin D, multivitamin with minerals, acetaminophen, PRESERVISION AREDS 2, aspirin, senna-docusate, Cholecalciferol, torsemide, pantoprazole, levothyroxine, Ferrous Sulfate, pantoprazole, atorvastatin, carvedilol, and levothyroxine.  Meds ordered this encounter  Medications  . Zoster Vaccine Adjuvanted [facility]) injection    Sig: Inject 0.5 mLs into the muscle once for 1 dose.    Dispense:  1 each    Refill:  1    There are no discontinued medications.  Follow-up: Return in about 6 months (around 12/16/2018).   [name], MD

## 2018-06-18 NOTE — Assessment & Plan Note (Signed)

## 2018-06-18 NOTE — Assessment & Plan Note (Signed)
Current iron stores are normal and she is not anemic.  Lab Results  Component Value Date   IRON 64 06/17/2018   TIBC 212 (L) 06/17/2018   FERRITIN 59 06/17/2018   Lab Results  Component Value Date   WBC 8.6 06/17/2018   HGB 13.0 06/17/2018   HCT 38.2 06/17/2018   MCV 96.2 06/17/2018   PLT 163.0 06/17/2018

## 2018-07-23 ENCOUNTER — Other Ambulatory Visit: Payer: Self-pay | Admitting: Internal Medicine

## 2018-08-13 ENCOUNTER — Ambulatory Visit: Payer: Self-pay | Admitting: Cardiovascular Disease

## 2018-08-26 ENCOUNTER — Telehealth: Payer: Self-pay

## 2018-08-26 NOTE — Telephone Encounter (Signed)
Spoke with Patient, she verbally consented to the e-visit. She will have her weight and her bp readings available for the visit. I also contacted her daughter who will be available for the telephone visit and I sent a copy through my chart.

## 2018-08-28 NOTE — Progress Notes (Signed)
Virtual Visit via Telephone Note    Evaluation Performed:  Follow-up visit  This visit type was conducted due to national recommendations for restrictions regarding the COVID-19 Pandemic (e.g. social distancing).  This format is felt to be most appropriate for this patient at this time.  All issues noted in this document were discussed and addressed.  No physical exam was performed (except for noted visual exam findings with Video Visits).  Please refer to the patient's chart (MyChart message for video visits and phone note for telephone visits) for the patient's consent to telehealth for [facility]  Date:  09/01/2018   ID:  Jenna Marshall, DOB Oct 08, 1925, MRN [id number]  Patient Location:  [place] [address]   Provider location:   Home  PCP:  [name], MD  Cardiologist:  [name], MD  Electrophysiologist:  None   Chief Complaint:  Telehealth follow up  History of Present Illness:    Jenna Marshall is a 85 y.o. female who presents via audio/video conferencing for a telehealth visit today.  She has a history of CAD s/p 4-vessel CABG in 10/2014 with (LIMA-LAD, VG-RCA, VG-RI-OM), diastolic dysfunction, aortic valve insufficiency, HTN, HLD, hypothyroidism, depression, arthritis, and GERD.   In 10/2014, she underwent stress testing in the setting of chest discomfort which was abnormal. Follow up diagnostic cardiac cath showed severe left main and ostial LAD disease with recommendation for urgent CABG. She underwent successful 4-vessel CABG at that time and has not had any ischemic evaluations since. 3 months following her bypass, she suffered a fractured left hip requiring hemiarthroplasty. Most recent echo from 03/2017 showed normal LVSF, Gr2DD, moderate AI, and mild MR. She was last seen in the office in 02/2018, and was doing reasonably well, ambulating with a walker secondary to longstanding leg weakness.   Labs: 06/2018 - TSH 4.24, CBC unremarkable, Na 132,  K+ 4.4, SCr 0.87, LFT normal, LDL 58, TG 130  Spoke with patient and her daughter over the phone today for 55-month follow-up.  Patient and daughter indicate the patient is doing quite well.  No chest pain, shortness of breath, dizziness, presyncope, or syncope.  No lower extremity swelling, abdominal distention, PND, early satiety.  She has stable two-pillow orthopnea.  She has not needed any of her PRN torsemide.  More recently, she has not been weighing herself or checking blood pressures regularly at home.  Blood pressure checked at this morning was noted to be 146/67.  Historically, blood pressure typically runs in the 120s over 70s.  Patient's daughter feels like the patient was excited for today's phone call leading to mildly elevated blood pressure.  Weight this morning was noted to be 174 pounds which is down 3 pounds compared to her last office visit with [place].  She has not had any falls, BRBPR, or melena since her last visit.  She continues to ambulate with a walker.  She does not have any issues or concerns today.  The patient does not have symptoms concerning for COVID-19 infection (fever, chills, cough, or new SHORTNESS OF BREATH).    Prior CV studies:   The following studies were reviewed today:  2D echo 03/2017: - Left ventricle: The cavity size was at the upper limits of   normal. Wall thickness was increased in a pattern of mild LVH.   Systolic function was normal. The estimated ejection fraction was   in the range of 60% to 65%. Features are consistent with a  pseudonormal left ventricular filling pattern, with concomitant   abnormal relaxation and increased filling pressure (grade 2   diastolic dysfunction). Doppler parameters are consistent with   high ventricular filling pressure. - Aortic valve: Mildly thickened leaflets. There was moderate   regurgitation. - Mitral valve: Calcified annulus. There was mild regurgitation. - Left atrium: The atrium was mildly dilated. -  Right ventricle: The cavity size was mildly dilated. Systolic   function was normal. - Right atrium: The atrium was mildly dilated. - Tricuspid valve: There was mild-moderate regurgitation. _________  LHC 10/2014:  Prox RCA lesion, 30% stenosed.  Mid RCA lesion, 70% stenosed.  Ost LM to LM lesion, 50% stenosed.  LM lesion, 99% stenosed.  Ost LAD lesion, 95% stenosed.  Prox LAD to Mid LAD lesion, 40% stenosed.   Final Conclusions:   1. Critical left main stenosis. Significant distal RCA stenosis. Heavily calcified arteries.  2. Normal EF by echo with no significant AS or MR  Recommendations:  Urgent CABG.  __________  Past Medical History:  Diagnosis Date  . Aortic insufficiency    a. 03/2017 Echo: Mod AI.  Marland Kitchen Arthritis   . CAD (coronary artery disease)    a. Lexiscan 10/13/14: mid anterior to apical & inf wall ischemia w/ WMA, mild to mod dep EF; b. Cath 10/2014: LM 50ost, LM 99, ost LAD 95, 40p/m, RCA 30p, 46m; b. 10/2014 CABG x 4 (LIMA->LAD, VG->RCA, VG->RI->OM).  . Depression   . Diastolic dysfunction    a. 10/2014 Echo: EF 55-60%, no RWMA, GR1DD, mild AI, trivial MR, mildly dilated LA, PASP normal; b. 03/2017 Echo: EF 60-65%, mild LVH, Gr2 DD, mod AI, mild MR, mildly dil LA, nl RV fxn, mildly dil RA, mild to mod TR.  Marland Kitchen GERD (gastroesophageal reflux disease)   . Hyperlipidemia   . Hypertension   . Hypothyroidism   . Macular degeneration   . pernicious anemia   . Varicose veins   . Wears dentures    partial upper  . Wears hearing aid    right   Past Surgical History:  Procedure Laterality Date  . ANTERIOR VITRECTOMY Right 07/16/2016   Procedure: ANTERIOR VITRECTOMY;  Surgeon: Eulogio Bear, MD;  Location: [place];  Service: Ophthalmology;  Laterality: Right;  . CARDIAC CATHETERIZATION Left 10/14/2014   Procedure: Left Heart Cath and Coronary Angiography;  Surgeon: Wellington Hampshire, MD;  Location: [place] [facility];  Service: Cardiovascular;   Laterality: Left;  . CATARACT EXTRACTION W/PHACO Left 04/30/2016   Procedure: CATARACT EXTRACTION PHACO AND INTRAOCULAR LENS PLACEMENT (IOC);  Surgeon: Eulogio Bear, MD;  Location: [place];  Service: Ophthalmology;  Laterality: Left;  LEFT  . CATARACT EXTRACTION W/PHACO Right 07/16/2016   Procedure: CATARACT EXTRACTION PHACO AND INTRAOCULAR LENS PLACEMENT ([place])  Right;  Surgeon: Eulogio Bear, MD;  Location: [place];  Service: Ophthalmology;  Laterality: Right;  . CORONARY ARTERY BYPASS GRAFT N/A 10/14/2014   Procedure: CORONARY ARTERY BYPASS GRAFTING (CABG), ON PUMP, TIMES FOUR, USING LEFT INTERNAL MAMMARY ARTERY, RIGHT GREATER SAPHENOUS VEIN HARVESTED ENDOSCOPICALLY;  Surgeon: Gaye Pollack, MD;  Location: [place];  Service: Open Heart Surgery;  Laterality: N/A;  . CORONARY ARTERY BYPASS GRAFT  10/2014   LIMA-->LAD, SVG-->RCA, sequential SVG-->[place] and OM  . HEMORRHOID SURGERY    . HIP ARTHROPLASTY Left 01/14/2015   Procedure: ARTHROPLASTY BIPOLAR HIP (HEMIARTHROPLASTY);  Surgeon: Dereck Leep, MD;  Location: [facility];  Service: Orthopedics;  Laterality: Left;  Marland Kitchen VARICOSE VEIN  SURGERY       Current Meds  Medication Sig  . acetaminophen (TYLENOL) 325 MG tablet Take 2 tablets (650 mg total) by mouth every 6 (six) hours as needed for mild pain.  Marland Kitchen aspirin 81 MG chewable tablet Chew 1 tablet (81 mg total) by mouth daily.  Marland Kitchen atorvastatin (LIPITOR) 40 MG tablet Take 1 tablet (40 mg total) by mouth daily.  . Calcium Carbonate-Vitamin D (CALCIUM + D) 600-200 MG-UNIT TABS Take 1 tablet by mouth 2 (two) times daily.   . carvedilol (COREG) 3.125 MG tablet Take 1 tablet (3.125 mg total) by mouth 2 (two) times daily with a meal.  . Cholecalciferol (D3 DOTS) 2000 UNITS TBDP Take 2,000 Units by mouth.  . Ferrous Sulfate 142 (45 Fe) MG TBCR Take 1 tablet by mouth every other day.  . levothyroxine (SYNTHROID, LEVOTHROID) 25 MCG tablet Take 1 tablet (25 mcg total) by mouth  daily with breakfast.  . Multiple Vitamin (MULTIVITAMIN WITH MINERALS) TABS tablet Take 1 tablet by mouth daily.  . Multiple Vitamins-Minerals (PRESERVISION AREDS 2) CAPS Take 1 capsule by mouth 2 (two) times daily.   . pantoprazole (PROTONIX) 40 MG tablet TAKE ONE TABLET BY MOUTH ONCE DAILY  . senna-docusate (SENOKOT-S) 8.6-50 MG per tablet Take 1 tablet by mouth 2 (two) times daily.  Marland Kitchen torsemide (DEMADEX) 10 MG tablet As needed for overnight weight gain of 2 lbs     Allergies:   Ambien [zolpidem]; Codeine; and Lasix [furosemide]   Social History   Tobacco Use  . Smoking status: Never Smoker  . Smokeless tobacco: Never Used  Substance Use Topics  . Alcohol use: No  . Drug use: No     Family Hx: The patient's family history includes Coronary artery disease in her brother and mother; Diabetes in her mother; Heart disease in her father. There is no history of Breast cancer.  ROS:   Please see the history of present illness.     All other systems reviewed and are negative.   Labs/Other Tests and Data Reviewed:    Recent Labs: 06/17/2018: ALT 12; BUN 22; Creatinine, Ser 0.87; Hemoglobin 13.0; Platelets 163.0; Potassium 4.4; Sodium 132; TSH 4.24   Recent Lipid Panel Lab Results  Component Value Date/Time   CHOL 134 06/17/2018 10:21 AM   CHOL 114 12/23/2014 08:26 AM   TRIG 130.0 06/17/2018 10:21 AM   HDL 50.10 06/17/2018 10:21 AM   HDL 40 12/23/2014 08:26 AM   CHOLHDL 3 06/17/2018 10:21 AM   LDLCALC 58 06/17/2018 10:21 AM   LDLCALC 56 12/23/2014 08:26 AM   LDLDIRECT 104.4 02/27/2011 11:35 AM    Wt Readings from Last 3 Encounters:  09/01/18 174 lb (78.9 kg)  06/17/18 176 lb 12.8 oz (80.2 kg)  02/10/18 177 lb 8 oz (80.5 kg)     Exam:    Vital Signs:  BP (!) 146/67 (BP Location: Left Arm, Patient Position: Sitting)   Pulse 61   Ht 5\' 1"  (1.549 m)   Wt 174 lb (78.9 kg)   BMI 32.88 kg/m    Well nourished, well developed female in no acute distress.   ASSESSMENT &  PLAN:    1.  CAD involving the native coronary arteries without angina: -She is doing well without any symptoms concerning for angina -Continue aspirin, Coreg, and Lipitor -Aggressive risk factor modification and secondary prevention -No plans for ischemic evaluation at this time  2. HTN: -Blood pressure is mildly elevated today at 146/67 -Historically, her blood pressure  typically runs in the 120s over 70s -I did not make any adjustments to her antihypertensive medication at this time given this is an isolated elevated reading -She will check her blood pressure daily for the next 2 weeks and contact me with these results -Further recommendations pending these trended BP readings  3. HLD: -LDL of 58 from 06/2018 with normal LFT at that time -Tolerating current dose of Lipitor  4. Moderate aortic valve insufficiency/mild mitral regurgitation: -Relatively stable by echo in 03/2017 and previously noted on TEE in 10/2014 at the time of her CABG -Following lessening of COVID-19 social distancing restrictions, we will plan for repeat TTE, perhaps in the fall 2020  COVID-19 Education: The signs and symptoms of COVID-19 were discussed with the patient and how to seek care for testing (follow up with PCP or arrange E-visit).  The importance of social distancing was discussed today.  Patient Risk:   After full review of this patients clinical status, I feel that they are at least moderate risk at this time.  Time:   Today, I have spent 18 minutes with the patient with telehealth technology discussing the above.     Medication Adjustments/Labs and Tests Ordered: Current medicines are reviewed at length with the patient today.  Concerns regarding medicines are outlined above.  Tests Ordered: No orders of the defined types were placed in this encounter.  Medication Changes: No orders of the defined types were placed in this encounter.   Disposition:  in 6 month(s)  Signed, Christell Faith,  PA-C  09/01/2018 11:24 AM    [facility]

## 2018-09-01 ENCOUNTER — Encounter: Payer: Self-pay | Admitting: Physician Assistant

## 2018-09-01 ENCOUNTER — Telehealth (INDEPENDENT_AMBULATORY_CARE_PROVIDER_SITE_OTHER): Payer: Medicare HMO | Admitting: Physician Assistant

## 2018-09-01 ENCOUNTER — Other Ambulatory Visit: Payer: Self-pay

## 2018-09-01 VITALS — BP 146/67 | HR 61 | Ht 61.0 in | Wt 174.0 lb

## 2018-09-01 DIAGNOSIS — I10 Essential (primary) hypertension: Secondary | ICD-10-CM | POA: Diagnosis not present

## 2018-09-01 DIAGNOSIS — E785 Hyperlipidemia, unspecified: Secondary | ICD-10-CM | POA: Diagnosis not present

## 2018-09-01 DIAGNOSIS — I351 Nonrheumatic aortic (valve) insufficiency: Secondary | ICD-10-CM

## 2018-09-01 DIAGNOSIS — I2510 Atherosclerotic heart disease of native coronary artery without angina pectoris: Secondary | ICD-10-CM | POA: Diagnosis not present

## 2018-09-01 NOTE — Patient Instructions (Signed)
It was a pleasure to speak with you on the phone today! Thank you for allowing [place] to continue taking care of your [facility] needs during this time.   Feel free to call as needed for questions and concerns related to your cardiac needs.  Medication Instructions:  Your physician recommends that you continue on your current medications as directed. Please refer to the Current Medication list given to you today.  If you need a refill on your cardiac medications before your next appointment, please call your pharmacy.   Lab work: None ordered  If you have labs (blood work) drawn today and your tests are completely normal, you will receive your results only by: Marland Kitchen MyChart Message (if you have MyChart) OR . A paper copy in the mail If you have any lab test that is abnormal or we need to change your treatment, we will call you to review the results.  Testing/Procedures: None ordered   Follow-Up: At [facility], you and your health needs are our priority.  As part of our continuing mission to provide you with exceptional heart care, we have created designated Provider Care Teams.  These Care Teams include your primary Cardiologist (physician) and Advanced Practice Providers (APPs -  Physician Assistants and Nurse Practitioners) who all work together to provide you with the care you need, when you need it. You will need a follow up appointment in 6 months.  Please call our office 2 months in advance to schedule this appointment.  You may see [name], MD or Christell Faith, PA-C.  Any Other Special Instructions Will Be Listed Below (If Applicable). Please take your blood pressure and weight daily.  1- Call the clinic in 2 weeks with BP readings.  How to use a home blood pressure monitor. . Be still. Don't smoke, drink caffeinated beverages or exercise within 30 minutes before measuring your blood pressure. . Sit correctly. Sit with your back straight and supported (on a dining chair, rather than  a sofa). Your feet should be flat on the floor and your legs should not be crossed. Your arm should be supported on a flat surface (such as a table) with the upper arm at heart level. Make sure the bottom of the cuff is placed directly above the bend of the elbow.  . Measure at the same time every day. It's important to take the readings at the same time each day, such as morning and evening. Take reading approximately 1 hour after BP medications. 2- Please take weights each morning.  Call clinic with any weight gain of greater than 2 pounds in day or 5 pounds in a week. Call for lower extremity swelling or SOB related to fluid volume over load including sleeping on more pillows or sitting up.    How to use a home scale Place the scale on a flat surface without carpet. Use a calendar, notebook paper, or log to track your weights and keep it by your scale. Each morning step on the scale after you empty your bladder. Weigh before eating or drinking. Always weigh in the same clothing or no clothing. Record your weight every day. Weighing twice a day is not necessary. If you choose to weigh in the morning and evening make sure your doctor knows which weights you are talking about. Bring journal to each office visit.

## 2018-09-10 ENCOUNTER — Other Ambulatory Visit: Payer: Self-pay | Admitting: Internal Medicine

## 2018-09-10 ENCOUNTER — Other Ambulatory Visit: Payer: Self-pay

## 2018-09-21 ENCOUNTER — Ambulatory Visit: Payer: Self-pay

## 2018-11-18 DIAGNOSIS — H6123 Impacted cerumen, bilateral: Secondary | ICD-10-CM | POA: Diagnosis not present

## 2018-11-18 DIAGNOSIS — H9190 Unspecified hearing loss, unspecified ear: Secondary | ICD-10-CM | POA: Diagnosis not present

## 2018-11-27 ENCOUNTER — Other Ambulatory Visit: Payer: Self-pay | Admitting: Internal Medicine

## 2018-11-27 MED ORDER — LEVOTHYROXINE SODIUM 25 MCG PO TABS: 25.0000 ug | ORAL_TABLET | Freq: Every day | ORAL | 1 refills | Status: DC

## 2018-11-27 NOTE — Telephone Encounter (Signed)
Medication: levothyroxine (SYNTHROID, LEVOTHROID) 25 MCG tablet + pantoprazole (PROTONIX) 40 MG tablet   Has the patient contacted their pharmacy? Yes  (Agent: If no, request that the patient contact the pharmacy for the refill.) (Agent: If yes, when and what did the pharmacy advise?)  Preferred Pharmacy (with phone number or street name): [place], [place] [address] [place] [address] Phone: [phone] Fax: [phone]    Agent: Please be advised that RX refills may take up to 3 business days. We ask that you follow-up with your pharmacy.  Ms. Jenna Marshall faxed request March 31st, pt is now out of medication

## 2018-11-27 NOTE — Telephone Encounter (Signed)
Refill sent.

## 2018-12-01 ENCOUNTER — Ambulatory Visit: Payer: Self-pay

## 2018-12-10 ENCOUNTER — Other Ambulatory Visit: Payer: Self-pay

## 2018-12-10 ENCOUNTER — Ambulatory Visit (INDEPENDENT_AMBULATORY_CARE_PROVIDER_SITE_OTHER): Payer: Medicare HMO

## 2018-12-10 DIAGNOSIS — Z0000 Encounter for general adult medical examination without abnormal findings: Secondary | ICD-10-CM | POA: Diagnosis not present

## 2018-12-10 NOTE — Progress Notes (Addendum)
Subjective:   Jenna Marshall is a 85 y.o. female who presents for Medicare Annual (Subsequent) preventive examination.  Review of Systems:  No ROS.  Medicare Wellness Virtual Visit.  Visual/audio telehealth visit, UTA vital signs.   See social history for additional risk factors.   Cardiac Risk Factors include: advanced age (>9men, >25 women);hypertension     Objective:     Vitals: There were no vitals taken for this visit.  There is no height or weight on file to calculate BMI.  Advanced Directives 12/10/2018 09/17/2017 09/04/2016 07/16/2016 04/30/2016 01/26/2015 01/13/2015  Does Patient Have a Medical Advance Directive? Yes Yes Yes No No No No  Type of Paramedic of [place];Living will Healthcare Power of [place];Living will - - - -  Does patient want to make changes to medical advance directive? No - Patient declined No - Patient declined No - Patient declined - No - Patient declined - -  Copy of [place] in Chart? Yes - validated most recent copy scanned in chart (See row information) No - copy requested No - copy requested - - - -  Would patient like information on creating a medical advance directive? - - - No - Patient declined No - Patient declined Yes - Scientist, clinical (histocompatibility and immunogenetics) given Yes - Scientist, clinical (histocompatibility and immunogenetics) given    Tobacco Social History   Tobacco Use  Smoking Status Never Smoker  Smokeless Tobacco Never Used     Counseling given: Not Answered   Clinical Intake:  Pre-visit preparation completed: Yes        Diabetes: No  How often do you need to have someone help you when you read instructions, pamphlets, or other written materials from your doctor or pharmacy?: 2 - Rarely  Interpreter Needed?: No     Past Medical History:  Diagnosis Date  . Aortic insufficiency    a. 03/2017 Echo: Mod AI.  Marland Kitchen Arthritis   . CAD (coronary artery disease)    a. Lexiscan 10/13/14: mid anterior to  apical & inf wall ischemia w/ WMA, mild to mod dep EF; b. Cath 10/2014: LM 50ost, LM 99, ost LAD 95, 40p/m, RCA 30p, 71m; b. 10/2014 CABG x 4 (LIMA->LAD, VG->RCA, VG->RI->OM).  . Depression   . Diastolic dysfunction    a. 10/2014 Echo: EF 55-60%, no RWMA, GR1DD, mild AI, trivial MR, mildly dilated LA, PASP normal; b. 03/2017 Echo: EF 60-65%, mild LVH, Gr2 DD, mod AI, mild MR, mildly dil LA, nl RV fxn, mildly dil RA, mild to mod TR.  Marland Kitchen GERD (gastroesophageal reflux disease)   . Hyperlipidemia   . Hypertension   . Hypothyroidism   . Macular degeneration   . pernicious anemia   . Varicose veins   . Wears dentures    partial upper  . Wears hearing aid    right   Past Surgical History:  Procedure Laterality Date  . ANTERIOR VITRECTOMY Right 07/16/2016   Procedure: ANTERIOR VITRECTOMY;  Surgeon: Eulogio Bear, MD;  Location: [facility];  Service: Ophthalmology;  Laterality: Right;  . CARDIAC CATHETERIZATION Left 10/14/2014   Procedure: Left Heart Cath and Coronary Angiography;  Surgeon: Wellington Hampshire, MD;  Location: [facility];  Service: Cardiovascular;  Laterality: Left;  . CATARACT EXTRACTION W/PHACO Left 04/30/2016   Procedure: CATARACT EXTRACTION PHACO AND INTRAOCULAR LENS PLACEMENT (IOC);  Surgeon: Eulogio Bear, MD;  Location: [facility];  Service: Ophthalmology;  Laterality: Left;  LEFT  .  CATARACT EXTRACTION W/PHACO Right 07/16/2016   Procedure: CATARACT EXTRACTION PHACO AND INTRAOCULAR LENS PLACEMENT ([place])  Right;  Surgeon: Eulogio Bear, MD;  Location: [facility];  Service: Ophthalmology;  Laterality: Right;  . CORONARY ARTERY BYPASS GRAFT N/A 10/14/2014   Procedure: CORONARY ARTERY BYPASS GRAFTING (CABG), ON PUMP, TIMES FOUR, USING LEFT INTERNAL MAMMARY ARTERY, RIGHT GREATER SAPHENOUS VEIN HARVESTED ENDOSCOPICALLY;  Surgeon: Gaye Pollack, MD;  Location: [facility];  Service: Open Heart Surgery;  Laterality: N/A;  . CORONARY ARTERY BYPASS  GRAFT  10/2014   LIMA-->LAD, SVG-->RCA, sequential SVG-->[place] and OM  . HEMORRHOID SURGERY    . HIP ARTHROPLASTY Left 01/14/2015   Procedure: ARTHROPLASTY BIPOLAR HIP (HEMIARTHROPLASTY);  Surgeon: Dereck Leep, MD;  Location: [facility];  Service: Orthopedics;  Laterality: Left;  Marland Kitchen VARICOSE VEIN SURGERY     Family History  Problem Relation Age of Onset  . Coronary artery disease Mother   . Diabetes Mother   . Heart disease Father   . Coronary artery disease Brother   . Breast cancer Neg Hx    Social History   Socioeconomic History  . Marital status: Widowed    Spouse name: Not on file  . Number of children: Not on file  . Years of education: Not on file  . Highest education level: Not on file  Occupational History  . Not on file  Social Needs  . Financial resource strain: Not hard at all  . Food insecurity    Worry: Never true    Inability: Never true  . Transportation needs    Medical: No    Non-medical: No  Tobacco Use  . Smoking status: Never Smoker  . Smokeless tobacco: Never Used  Substance and Sexual Activity  . Alcohol use: No  . Drug use: No  . Sexual activity: Never  Lifestyle  . Physical activity    Days per week: Not on file    Minutes per session: Not on file  . Stress: Not at all  Relationships  . Social Herbalist on phone: Not on file    Gets together: Not on file    Attends religious service: Not on file    Active member of club or organization: Not on file    Attends meetings of clubs or organizations: Not on file    Relationship status: Not on file  Other Topics Concern  . Not on file  Social History Narrative   ** Merged History Encounter **        Outpatient Encounter Medications as of 12/10/2018  Medication Sig  . acetaminophen (TYLENOL) 325 MG tablet Take 2 tablets (650 mg total) by mouth every 6 (six) hours as needed for mild pain.  Marland Kitchen aspirin 81 MG chewable tablet Chew 1 tablet (81 mg total) by mouth daily.  Marland Kitchen atorvastatin  (LIPITOR) 40 MG tablet Take 1 tablet (40 mg total) by mouth daily.  . Calcium Carbonate-Vitamin D (CALCIUM + D) 600-200 MG-UNIT TABS Take 1 tablet by mouth 2 (two) times daily.   . carvedilol (COREG) 3.125 MG tablet TAKE ONE TABLET BY MOUTH TWICE DAILY WITH A MEAL  . Cholecalciferol (D3 DOTS) 2000 UNITS TBDP Take 2,000 Units by mouth.  . Ferrous Sulfate 142 (45 Fe) MG TBCR Take 1 tablet by mouth every other day.  . levothyroxine (SYNTHROID) 25 MCG tablet Take 1 tablet (25 mcg total) by mouth daily with breakfast.  . Multiple Vitamin (MULTIVITAMIN WITH MINERALS) TABS tablet Take 1 tablet  by mouth daily.  . Multiple Vitamins-Minerals (PRESERVISION AREDS 2) CAPS Take 1 capsule by mouth 2 (two) times daily.   . pantoprazole (PROTONIX) 40 MG tablet TAKE ONE TABLET BY MOUTH ONCE DAILY  . senna-docusate (SENOKOT-S) 8.6-50 MG per tablet Take 1 tablet by mouth 2 (two) times daily.  Marland Kitchen torsemide (DEMADEX) 10 MG tablet As needed for overnight weight gain of 2 lbs  . [DISCONTINUED] citalopram (CELEXA) 20 MG tablet Take 20 mg by mouth daily.     No facility-administered encounter medications on file as of 12/10/2018.     Activities of Daily Living In your present state of health, do you have any difficulty performing the following activities: 12/10/2018  Hearing? N  Vision? N  Difficulty concentrating or making decisions? N  Walking or climbing stairs? Y  Comment Unsteady gait. Walker in use when ambulating  Dressing or bathing? N  Doing errands, shopping? Y  Comment She does not Physiological scientist and eating ? Y  Comment Daughter cooks.  Self feeds.  Using the Toilet? N  In the past six months, have you accidently leaked urine? N  Do you have problems with loss of bowel control? N  Managing your Medications? Y  Comment Daughter manages  Managing your Finances? N  Housekeeping or managing your Housekeeping? Y  Comment Daughter manages  Some recent data might be hidden    Patient Care Team:  [name], MD as PCP - General (Internal Medicine) Wellington Hampshire, MD as PCP - Cardiology (Cardiology) [name], MD (Internal Medicine) Hessie Knows, MD as Consulting Physician (Orthopedic Surgery)    Assessment:   This is a routine wellness examination for Larissa.  I connected with patient 12/10/18 at 10:30 AM EDT by an audio enabled telemedicine application and verified that I am speaking with the correct person using two identifiers. Patient stated full name and DOB. Patient gave permission to continue with virtual visit. Patient's location was at home and Nurse's location was at [place] office.   Health Screenings  Mammogram - 11/2017 Colonoscopy - 08/2007 Bone Density - 05/2013 Glaucoma -none Hearing -hearing aids Labs followed by pcp Cholesterol - 06/2018 Dental- visits every 6 months Vision- visits within the last 12 months.  Social  Alcohol intake - no        Smoking history- never    Smokers in home? none Illicit drug use? none Physical activity- active around the home, walking indoors Diet - regular Sexually Active -never BMI- discussed the importance of a healthy diet, water intake and the benefits of aerobic exercise.  Educational material provided.   Safety  Patient feels safe at home- yes Patient does have smoke detectors at home- yes Patient does wear sunscreen or protective clothing when in direct sunlight -yes Patient does wear seat belt when in a moving vehicle -yes Patient drives- no Life alert- yes  Covid-19 precautions and sickness symptoms discussed.   Activities of Daily Living Patient denies needing assistance with: feeding themselves, getting from bed to chair, getting to the toilet, bathing/showering or dressing.  Daughter assists with driving, house hold chores, meal prep and managing money as needed.   Depression Screen Patient denies losing interest in daily life, feeling hopeless, or crying easily over simple problems.    Medication-taking as directed and without issues.   Fall Screen Patient denies being afraid of falling or falling in the last year.   Memory Screen Patient is alert.  Correctly identified the president of the [place]  and season. Patient likes to read for brain stimulation.  Immunizations The following Immunizations were discussed: Influenza, shingles, pneumonia, and tetanus.   Other Providers Patient Care Team: [name], MD as PCP - General (Internal Medicine) Wellington Hampshire, MD as PCP - Cardiology (Cardiology) [name], MD (Internal Medicine) Hessie Knows, MD as Consulting Physician (Orthopedic Surgery)  Exercise Activities and Dietary recommendations    Goals    . Follow up with Primary Care Provider     Keep all routine maintenance appointments       Fall Risk Fall Risk  12/10/2018 06/17/2018 09/17/2017 09/04/2016 07/31/2015  Falls in the past year? 0 0 No No Yes  Number falls in past yr: - - - - 1  Injury with Fall? - - - - Yes  Risk Factor Category  - - - - (No Data)  Comment - - - - Hip fractured   Risk for fall due to : - History of fall(s);Impaired mobility - - Other (Comment)  Risk for fall due to: Comment - - - - Trip over chair.  Follow up - Falls evaluation completed - - Falls evaluation completed   Depression Screen PHQ 2/9 Scores 12/10/2018 06/17/2018 09/17/2017 09/04/2016  PHQ - 2 Score 0 0 0 0     Cognitive Function     6CIT Screen 12/10/2018 09/17/2017 09/04/2016  What Year? 0 points 0 points 0 points  What month? 0 points 0 points 0 points  What time? 0 points 0 points 0 points  Count back from 20 0 points 0 points 0 points  Months in reverse 0 points 0 points 0 points  Repeat phrase - 0 points 0 points  Total Score - 0 0    Immunization History  Administered Date(s) Administered  . Influenza Split 02/27/2011, 03/04/2012  . Influenza, High Dose Seasonal PF 03/31/2013, 02/24/2018  . Influenza,inj,Quad PF,6+ Mos 03/30/2014  .  Influenza-Unspecified 02/06/2015, 02/06/2016, 03/05/2017  . Pneumococcal Conjugate-13 03/30/2014  . Pneumococcal Polysaccharide-23 03/10/2009, 04/19/2015  . Tdap 10/06/2013  . Zoster 03/11/2011   Screening Tests Health Maintenance  Topic Date Due  . INFLUENZA VACCINE  01/02/2019  . TETANUS/TDAP  10/07/2023  . DEXA SCAN  Completed  . PNA vac Low Risk Adult  Completed      Plan:    End of life planning; Advance aging; Advanced directives discussed.  Copy of current HCPOA/Living Will on file.    I have personally reviewed and noted the following in the patient's chart:   . Medical and social history . Use of alcohol, tobacco or illicit drugs  . Current medications and supplements . Functional ability and status . Nutritional status . Physical activity . Advanced directives . List of other physicians . Hospitalizations, surgeries, and ER visits in previous 12 months . Vitals . Screenings to include cognitive, depression, and falls . Referrals and appointments  In addition, I have reviewed and discussed with patient certain preventive protocols, quality metrics, and best practice recommendations. A written personalized care plan for preventive services as well as general preventive health recommendations were provided to patient.     OBrien-Blaney, Intisar Claudio L, LPN  02/08/3531   I have reviewed the above information and agree with above.   Deborra Medina, MD

## 2018-12-10 NOTE — Patient Instructions (Addendum)
  Jenna Marshall , Thank you for taking time to come for your Medicare Wellness Visit. I appreciate your ongoing commitment to your health goals. Please review the following plan we discussed and let me know if I can assist you in the future.   These are the goals we discussed: Goals    . Follow up with Primary Care Provider     Keep all routine maintenance appointments       This is a list of the screening recommended for you and due dates:  Health Maintenance  Topic Date Due  . Flu Shot  01/02/2019  . Tetanus Vaccine  10/07/2023  . DEXA scan (bone density measurement)  Completed  . Pneumonia vaccines  Completed

## 2018-12-16 ENCOUNTER — Encounter: Payer: Self-pay | Admitting: Internal Medicine

## 2018-12-16 ENCOUNTER — Ambulatory Visit (INDEPENDENT_AMBULATORY_CARE_PROVIDER_SITE_OTHER): Payer: Medicare HMO | Admitting: Internal Medicine

## 2018-12-16 ENCOUNTER — Other Ambulatory Visit: Payer: Self-pay

## 2018-12-16 DIAGNOSIS — D508 Other iron deficiency anemias: Secondary | ICD-10-CM

## 2018-12-16 DIAGNOSIS — Z79899 Other long term (current) drug therapy: Secondary | ICD-10-CM | POA: Diagnosis not present

## 2018-12-16 DIAGNOSIS — E7800 Pure hypercholesterolemia, unspecified: Secondary | ICD-10-CM | POA: Diagnosis not present

## 2018-12-16 DIAGNOSIS — E034 Atrophy of thyroid (acquired): Secondary | ICD-10-CM

## 2018-12-16 NOTE — Progress Notes (Signed)
Telephone Note  This visit type was conducted due to national recommendations for restrictions regarding the COVID-19 pandemic (e.g. social distancing).  This format is felt to be most appropriate for this patient at this time.  All issues noted in this document were discussed and addressed.  No physical exam was performed (except for noted visual exam findings with Video Visits).   I connected with@ on 12/16/18 at  9:00 AM EDT by telephone and verified that I am speaking with the correct person using two identifiers. Location patient: home Location provider: work or home office Persons participating in the virtual visit: patient, provider  I discussed the limitations, risks, security and privacy concerns of performing an evaluation and management service by telephone and the availability of in person appointments. I also discussed with the patient that there may be a patient responsible charge related to this service. The patient expressed understanding and agreed to proceed.  Reason for visit:  Follow up HPI:   The patient has no signs or symptoms of COVID 19 infection (fever, cough, sore throat  or shortness of breath beyond what is typical for patient).  Patient denies contact with other persons with the above mentioned symptoms or with anyone confirmed to have COVID 19  The patient has no signs or symptoms of COVID 19 infection (fever, cough, sore throat  or shortness of breath beyond what is typical for patient).  Patient denies contact with other persons with the above mentioned symptoms or with anyone confirmed to have COVID 19   Hypertension: patient checks blood pressure twice weekly at home.  Readings have been for the most part > 140/80 at rest . Patient is following a reduce salt diet most days and is taking medications as prescribed.  She feels generally well.  Walking in the house for exercise no falls,  weighing daily and using lasix prn weight gain   ROS: See pertinent  positives and negatives per HPI.  Past Medical History:  Diagnosis Date  . Aortic insufficiency    a. 03/2017 Echo: Mod AI.  Marland Kitchen Arthritis   . CAD (coronary artery disease)    a. Lexiscan 10/13/14: mid anterior to apical & inf wall ischemia w/ WMA, mild to mod dep EF; b. Cath 10/2014: LM 50ost, LM 99, ost LAD 95, 40p/m, RCA 30p, 73m; b. 10/2014 CABG x 4 (LIMA->LAD, VG->RCA, VG->RI->OM).  . Depression   . Diastolic dysfunction    a. 10/2014 Echo: EF 55-60%, no RWMA, GR1DD, mild AI, trivial MR, mildly dilated LA, PASP normal; b. 03/2017 Echo: EF 60-65%, mild LVH, Gr2 DD, mod AI, mild MR, mildly dil LA, nl RV fxn, mildly dil RA, mild to mod TR.  Marland Kitchen GERD (gastroesophageal reflux disease)   . Hyperlipidemia   . Hypertension   . Hypothyroidism   . Macular degeneration   . pernicious anemia   . Varicose veins   . Wears dentures    partial upper  . Wears hearing aid    right    Past Surgical History:  Procedure Laterality Date  . ANTERIOR VITRECTOMY Right 07/16/2016   Procedure: ANTERIOR VITRECTOMY;  Surgeon: Eulogio Bear, MD;  Location: [facility];  Service: Ophthalmology;  Laterality: Right;  . CARDIAC CATHETERIZATION Left 10/14/2014   Procedure: Left Heart Cath and Coronary Angiography;  Surgeon: Wellington Hampshire, MD;  Location: [facility];  Service: Cardiovascular;  Laterality: Left;  . CATARACT EXTRACTION W/PHACO Left 04/30/2016   Procedure: CATARACT EXTRACTION PHACO AND INTRAOCULAR LENS PLACEMENT (  [place]);  Surgeon: Eulogio Bear, MD;  Location: [facility];  Service: Ophthalmology;  Laterality: Left;  LEFT  . CATARACT EXTRACTION W/PHACO Right 07/16/2016   Procedure: CATARACT EXTRACTION PHACO AND INTRAOCULAR LENS PLACEMENT ([place])  Right;  Surgeon: Eulogio Bear, MD;  Location: [facility];  Service: Ophthalmology;  Laterality: Right;  . CORONARY ARTERY BYPASS GRAFT N/A 10/14/2014   Procedure: CORONARY ARTERY BYPASS GRAFTING (CABG), ON PUMP, TIMES  FOUR, USING LEFT INTERNAL MAMMARY ARTERY, RIGHT GREATER SAPHENOUS VEIN HARVESTED ENDOSCOPICALLY;  Surgeon: Gaye Pollack, MD;  Location: [facility];  Service: Open Heart Surgery;  Laterality: N/A;  . CORONARY ARTERY BYPASS GRAFT  10/2014   LIMA-->LAD, SVG-->RCA, sequential SVG-->Ramus and OM  . HEMORRHOID SURGERY    . HIP ARTHROPLASTY Left 01/14/2015   Procedure: ARTHROPLASTY BIPOLAR HIP (HEMIARTHROPLASTY);  Surgeon: Dereck Leep, MD;  Location: [facility];  Service: Orthopedics;  Laterality: Left;  Marland Kitchen VARICOSE VEIN SURGERY      Family History  Problem Relation Age of Onset  . Coronary artery disease Mother   . Diabetes Mother   . Heart disease Father   . Coronary artery disease Brother   . Breast cancer Neg Hx     reports that she has never smoked. She has never used smokeless tobacco. She reports that she does not drink alcohol or use drugs.   SOCIAL HX:   Current Outpatient Medications:  .  acetaminophen (TYLENOL) 325 MG tablet, Take 2 tablets (650 mg total) by mouth every 6 (six) hours as needed for mild pain., Disp: , Rfl:  .  aspirin 81 MG chewable tablet, Chew 1 tablet (81 mg total) by mouth daily., Disp: 30 tablet, Rfl: 0 .  atorvastatin (LIPITOR) 40 MG tablet, Take 1 tablet (40 mg total) by mouth daily., Disp: 90 tablet, Rfl: 2 .  Calcium Carbonate-Vitamin D (CALCIUM + D) 600-200 MG-UNIT TABS, Take 1 tablet by mouth 2 (two) times daily. , Disp: , Rfl:  .  carvedilol (COREG) 3.125 MG tablet, TAKE ONE TABLET BY MOUTH TWICE DAILY WITH A MEAL, Disp: 180 tablet, Rfl: 1 .  Cholecalciferol (D3 DOTS) 2000 UNITS TBDP, Take 2,000 Units by mouth., Disp: , Rfl:  .  Ferrous Sulfate 142 (45 Fe) MG TBCR, Take 1 tablet by mouth every other day., Disp: , Rfl:  .  levothyroxine (SYNTHROID) 25 MCG tablet, Take 1 tablet (25 mcg total) by mouth daily with breakfast., Disp: 90 tablet, Rfl: 1 .  Multiple Vitamin (MULTIVITAMIN WITH MINERALS) TABS tablet, Take 1 tablet by mouth daily., Disp: , Rfl:  .   Multiple Vitamins-Minerals (PRESERVISION AREDS 2) CAPS, Take 1 capsule by mouth 2 (two) times daily. , Disp: , Rfl:  .  pantoprazole (PROTONIX) 40 MG tablet, TAKE ONE TABLET BY MOUTH ONCE DAILY, Disp: 90 tablet, Rfl: 1 .  senna-docusate (SENOKOT-S) 8.6-50 MG per tablet, Take 1 tablet by mouth 2 (two) times daily., Disp: 30 tablet, Rfl: 0 .  torsemide (DEMADEX) 10 MG tablet, As needed for overnight weight gain of 2 lbs, Disp: 30 tablet, Rfl: 3  EXAM:  VITALS per patient if applicable:  GENERAL: alert, oriented, appears well and in no acute distress  HEENT: atraumatic, conjunttiva clear, no obvious abnormalities on inspection of external nose and ears  NECK: normal movements of the head and neck  LUNGS: on inspection no signs of respiratory distress, breathing rate appears normal, no obvious gross SOB, gasping or wheezing  CV: no obvious cyanosis  MS: moves all visible extremities  without noticeable abnormality  PSYCH/NEURO: pleasant and cooperative, no obvious depression or anxiety, speech and thought processing grossly intact  ASSESSMENT AND PLAN:   Hypothyroidism Managed with generic synthroid. She is due for surveillance of thyroid function with TSH,  which was checked today and WNL.  No changes to current dose.   Lab Results  Component Value Date   TSH 4.24 06/17/2018     Hyperlipidemia LDL and triglycerides are at goal on atorvastatin.   She has no side effects ,  Has known multivessel CAD,  and liver enzymes are due for follow up surveillance . No changes today   Lab Results  Component Value Date   CHOL 134 06/17/2018   HDL 50.10 06/17/2018   LDLCALC 58 06/17/2018   LDLDIRECT 104.4 02/27/2011   TRIG 130.0 06/17/2018   CHOLHDL 3 06/17/2018       I discussed the assessment and treatment plan with the patient. The patient was provided an opportunity to ask questions and all were answered. The patient agreed with the plan and demonstrated an understanding of the  instructions.   The patient was advised to call back or seek an in-person evaluation if the symptoms worsen or if the condition fails to improve as anticipated.  I provided  22 minutes of non-face-to-face time during this encounter.   [name], MD

## 2018-12-17 NOTE — Assessment & Plan Note (Signed)
Managed with generic synthroid. She is due for surveillance of thyroid function with TSH,  which was checked today and WNL.  No changes to current dose.   Lab Results  Component Value Date   TSH 4.24 06/17/2018

## 2018-12-17 NOTE — Assessment & Plan Note (Signed)
LDL and triglycerides are at goal on atorvastatin.   She has no side effects ,  Has known multivessel CAD,  and liver enzymes are due for follow up surveillance . No changes today   Lab Results  Component Value Date   CHOL 134 06/17/2018   HDL 50.10 06/17/2018   LDLCALC 58 06/17/2018   LDLDIRECT 104.4 02/27/2011   TRIG 130.0 06/17/2018   CHOLHDL 3 06/17/2018

## 2018-12-31 ENCOUNTER — Other Ambulatory Visit (INDEPENDENT_AMBULATORY_CARE_PROVIDER_SITE_OTHER): Payer: Medicare HMO

## 2018-12-31 ENCOUNTER — Other Ambulatory Visit: Payer: Self-pay

## 2018-12-31 DIAGNOSIS — E7800 Pure hypercholesterolemia, unspecified: Secondary | ICD-10-CM

## 2018-12-31 DIAGNOSIS — D508 Other iron deficiency anemias: Secondary | ICD-10-CM | POA: Diagnosis not present

## 2018-12-31 DIAGNOSIS — Z79899 Other long term (current) drug therapy: Secondary | ICD-10-CM

## 2018-12-31 DIAGNOSIS — E034 Atrophy of thyroid (acquired): Secondary | ICD-10-CM | POA: Diagnosis not present

## 2018-12-31 LAB — LIPID PANEL
Cholesterol: 133 mg/dL (ref 0–200)
HDL: 53.4 mg/dL (ref >39.00)
LDL Cholesterol: 64 mg/dL (ref 0–99)
NonHDL: 79.93
Total CHOL/HDL Ratio: 2
Triglycerides: 82 mg/dL (ref 0.0–149.0)
VLDL: 16.4 mg/dL (ref 0.0–40.0)

## 2018-12-31 LAB — COMPREHENSIVE METABOLIC PANEL
ALT: 13 U/L (ref 0–35)
AST: 18 U/L (ref 0–37)
Albumin: 4.2 g/dL (ref 3.5–5.2)
Alkaline Phosphatase: 53 U/L (ref 39–117)
BUN: 22 mg/dL (ref 6–23)
CO2: 26 mEq/L (ref 19–32)
Calcium: 9.4 mg/dL (ref 8.4–10.5)
Chloride: 101 mEq/L (ref 96–112)
Creatinine, Ser: 0.86 mg/dL (ref 0.40–1.20)
GFR: 61.84 mL/min (ref >60.00)
Glucose, Bld: 96 mg/dL (ref 70–99)
Potassium: 4.4 mEq/L (ref 3.5–5.1)
Sodium: 139 mEq/L (ref 135–145)
Total Bilirubin: 0.6 mg/dL (ref 0.2–1.2)
Total Protein: 6.6 g/dL (ref 6.0–8.3)

## 2018-12-31 LAB — TSH: TSH: 3.69 u[IU]/mL (ref 0.35–4.50)

## 2019-01-01 LAB — IRON,TIBC AND FERRITIN PANEL
%SAT: 35 % (calc) (ref 16–45)
Ferritin: 52 ng/mL (ref 16–288)
Iron: 72 ug/dL (ref 45–160)
TIBC: 205 mcg/dL (calc) — ABNORMAL LOW (ref 250–450)
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
## 2019-01-08 ENCOUNTER — Other Ambulatory Visit: Payer: Self-pay

## 2019-01-08 ENCOUNTER — Ambulatory Visit (INDEPENDENT_AMBULATORY_CARE_PROVIDER_SITE_OTHER): Payer: Medicare HMO | Admitting: Physician Assistant

## 2019-01-08 ENCOUNTER — Encounter: Payer: Self-pay | Admitting: Physician Assistant

## 2019-01-08 VITALS — BP 168/80 | HR 63 | Ht 64.5 in | Wt 172.5 lb

## 2019-01-08 DIAGNOSIS — E785 Hyperlipidemia, unspecified: Secondary | ICD-10-CM | POA: Diagnosis not present

## 2019-01-08 DIAGNOSIS — I351 Nonrheumatic aortic (valve) insufficiency: Secondary | ICD-10-CM

## 2019-01-08 DIAGNOSIS — I2510 Atherosclerotic heart disease of native coronary artery without angina pectoris: Secondary | ICD-10-CM | POA: Diagnosis not present

## 2019-01-08 DIAGNOSIS — I10 Essential (primary) hypertension: Secondary | ICD-10-CM

## 2019-01-08 NOTE — Patient Instructions (Signed)
Medication Instructions:  - Your physician recommends that you continue on your current medications as directed. Please refer to the Current Medication list given to you today.  If you need a refill on your cardiac medications before your next appointment, please call your pharmacy.   Lab work: - none ordered  If you have labs (blood work) drawn today and your tests are completely normal, you will receive your results only by: Marland Kitchen MyChart Message (if you have MyChart) OR . A paper copy in the mail If you have any lab test that is abnormal or we need to change your treatment, we will call you to review the results.  Testing/Procedures: - Your physician has requested that you have an echocardiogram. Echocardiography is a painless test that uses sound waves to create images of your heart. It provides your doctor with information about the size and shape of your heart and how well your heart's chambers and valves are working. This procedure takes approximately one hour. There are no restrictions for this procedure.  Follow-Up: At [facility], you and your health needs are our priority.  As part of our continuing mission to provide you with exceptional heart care, we have created designated Provider Care Teams.  These Care Teams include your primary Cardiologist (physician) and Advanced Practice Providers (APPs -  Physician Assistants and Nurse Practitioners) who all work together to provide you with the care you need, when you need it. You will need a follow up appointment in 6 months (February 2021).   Please call our office 2 months in advance to schedule this appointment. (Call in early December to schedule)  You may see [name], MD or one of the following Advanced Practice Providers on your designated Care Team:   Murray Hodgkins, NP Christell Faith, PA-C . Marrianne Mood, PA-C  Any Other Special Instructions Will Be Listed Below (If Applicable). - N/A    Echocardiogram An  echocardiogram is a procedure that uses painless sound waves (ultrasound) to produce an image of the heart. Images from an echocardiogram can provide important information about:  Signs of coronary artery disease (CAD).  Aneurysm detection. An aneurysm is a weak or damaged part of an artery wall that bulges out from the normal force of blood pumping through the body.  Heart size and shape. Changes in the size or shape of the heart can be associated with certain conditions, including heart failure, aneurysm, and CAD.  Heart muscle function.  Heart valve function.  Signs of a past heart attack.  Fluid buildup around the heart.  Thickening of the heart muscle.  A tumor or infectious growth around the heart valves. Tell a health care provider about:  Any allergies you have.  All medicines you are taking, including vitamins, herbs, eye drops, creams, and over-the-counter medicines.  Any blood disorders you have.  Any surgeries you have had.  Any medical conditions you have.  Whether you are pregnant or may be pregnant. What are the risks? Generally, this is a safe procedure. However, problems may occur, including:  Allergic reaction to dye (contrast) that may be used during the procedure. What happens before the procedure? No specific preparation is needed. You may eat and drink normally. What happens during the procedure?   An IV tube may be inserted into one of your veins.  You may receive contrast through this tube. A contrast is an injection that improves the quality of the pictures from your heart.  A gel will be applied to  your chest.  A wand-like tool (transducer) will be moved over your chest. The gel will help to transmit the sound waves from the transducer.  The sound waves will harmlessly bounce off of your heart to allow the heart images to be captured in real-time motion. The images will be recorded on a computer. The procedure may vary among health care  providers and hospitals. What happens after the procedure?  You may return to your normal, everyday life, including diet, activities, and medicines, unless your health care provider tells you not to do that. Summary  An echocardiogram is a procedure that uses painless sound waves (ultrasound) to produce an image of the heart.  Images from an echocardiogram can provide important information about the size and shape of your heart, heart muscle function, heart valve function, and fluid buildup around your heart.  You do not need to do anything to prepare before this procedure. You may eat and drink normally.  After the echocardiogram is completed, you may return to your normal, everyday life, unless your health care provider tells you not to do that. This information is not intended to replace advice given to you by your health care provider. Make sure you discuss any questions you have with your health care provider. Document Released: 05/17/2000 Document Revised: 09/10/2018 Document Reviewed: 06/22/2016 Elsevier Patient Education  2020 [employer].

## 2019-01-12 NOTE — Addendum Note (Signed)
Addended by: Alba Destine on: 01/12/2019 07:49 AM   Modules accepted: Orders

## 2019-01-20 ENCOUNTER — Other Ambulatory Visit: Payer: Self-pay | Admitting: Internal Medicine

## 2019-01-21 ENCOUNTER — Ambulatory Visit: Payer: Medicare HMO | Admitting: Physician Assistant

## 2019-02-10 ENCOUNTER — Other Ambulatory Visit: Payer: Self-pay

## 2019-02-10 ENCOUNTER — Ambulatory Visit (INDEPENDENT_AMBULATORY_CARE_PROVIDER_SITE_OTHER): Payer: Medicare HMO

## 2019-02-10 DIAGNOSIS — I351 Nonrheumatic aortic (valve) insufficiency: Secondary | ICD-10-CM

## 2019-02-12 ENCOUNTER — Telehealth: Payer: Self-pay | Admitting: *Deleted

## 2019-02-12 MED ORDER — TORSEMIDE 10 MG PO TABS: ORAL_TABLET | ORAL | 5 refills | Status: DC

## 2019-02-12 NOTE — Telephone Encounter (Signed)
Spoke with daughter. Says patient does not have any issues with taking torsemide. Patient is supposed to weight daily and take if weight is up 2-3 lbs. However, patient does not like to take it due to urinary incontinence so daughter does not think patient takes as often as she should. I told her I would let Thurmond Butts know and be in touch with any further recommendations. Daughter said it was ok to communicate via MyChart is needed.

## 2019-02-12 NOTE — Telephone Encounter (Signed)
Left message with patient's daughter that I would be sending her a MyChart message with Ryan's recommendations and to let me know if she has any further questions.

## 2019-02-12 NOTE — Telephone Encounter (Signed)
Daughter [name] is calling back, can be reached at [phone] when able

## 2019-02-12 NOTE — Telephone Encounter (Signed)
-----   Message from Rise Mu, PA-C sent at 02/11/2019  4:43 PM EDT ----- Echo showed normal pump function with increased wall thickness and stiffened heart with normal wall motion.  Right-sided pressure moderately elevated. Mildly to moderately leaky mitral valve. Moderately leaky tricuspid valve. Moderately leaky aortic valve.  When compared to prior echo from 2018 pump function and stiffened heart remain the same.  Leaky aortic valve is stable.  Leaky mitral valve is stable to just slightly progressed.  Patient has documented allergy to furosemide leading to rash/pruritus.  I see on her medication list she has previously been prescribed torsemide.  Can you please check with the patient and see if she has tolerated torsemide without issues?

## 2019-02-12 NOTE — Telephone Encounter (Signed)
Given progression of pressures in the heart noted on recent echo I would agree with the patient's daughter that she may not be taking her torsemide as often as she should be.  Please have patient take torsemide 10 mg daily for 5 days followed by as needed for increased shortness of breath and weight gain greater than 3 pounds overnight.  Check BMP in 2 weeks.

## 2019-02-12 NOTE — Telephone Encounter (Signed)
Results released to My Chart. Results called to pt. Pt verbalized understanding. She states she has tolerated torsemide as far as she knows.  Her daughter is the one who manages her medications for her.    Attempted to reach daughter, dpr, to ask her about torsemide as well. LMTCB.

## 2019-02-18 DIAGNOSIS — H353211 Exudative age-related macular degeneration, right eye, with active choroidal neovascularization: Secondary | ICD-10-CM | POA: Diagnosis not present

## 2019-02-18 DIAGNOSIS — H353221 Exudative age-related macular degeneration, left eye, with active choroidal neovascularization: Secondary | ICD-10-CM | POA: Diagnosis not present

## 2019-02-23 ENCOUNTER — Telehealth: Payer: Self-pay | Admitting: Cardiovascular Disease

## 2019-02-23 DIAGNOSIS — I10 Essential (primary) hypertension: Secondary | ICD-10-CM

## 2019-02-23 DIAGNOSIS — I2510 Atherosclerotic heart disease of native coronary artery without angina pectoris: Secondary | ICD-10-CM

## 2019-02-23 NOTE — Telephone Encounter (Addendum)
Spoke with the patient's daughter [name]. Adv her that the order for the labwork (bmet) that [employer], [facility] is requesting is in [place]. Adv her that it is ok with [place] if she chooses to have the labwork done at the [facility] in [place]. Martie Round to contact the [facility] to inquire about their lab hours and if an appt is needed. Cathy verbalized understanding and voiced appreciation for call.

## 2019-02-23 NOTE — Telephone Encounter (Signed)
Patients daughter, Ciro Backer, calling in regarding the bmp that her mother is needing. Daughter would prefer if patient could have her labs drawn at [facility], if so, the orders will need to be placed regardless. Please advise patients daughters with further instruction via phone call or mychart

## 2019-02-26 ENCOUNTER — Other Ambulatory Visit
Admission: RE | Admit: 2019-02-26 | Discharge: 2019-02-26 | Disposition: A | Discharge status: Home / Self Care | Payer: Medicare HMO | Attending: Physician Assistant | Admitting: Physician Assistant

## 2019-02-26 ENCOUNTER — Other Ambulatory Visit: Payer: Self-pay

## 2019-02-26 DIAGNOSIS — I2510 Atherosclerotic heart disease of native coronary artery without angina pectoris: Secondary | ICD-10-CM | POA: Diagnosis not present

## 2019-02-26 LAB — BASIC METABOLIC PANEL
Anion gap: 7 (ref 5–15)
BUN: 24 mg/dL — ABNORMAL HIGH (ref 8–23)
CO2: 28 mmol/L (ref 22–32)
Calcium: 9.3 mg/dL (ref 8.9–10.3)
Chloride: 100 mmol/L (ref 98–111)
Creatinine, Ser: 0.79 mg/dL (ref 0.44–1.00)
GFR calc Af Amer: >60 mL/min (ref >60)
GFR calc non Af Amer: >60 mL/min (ref >60)
Glucose, Bld: 88 mg/dL (ref 70–99)
Potassium: 4.5 mmol/L (ref 3.5–5.1)
Sodium: 135 mmol/L (ref 135–145)

## 2019-03-05 NOTE — Telephone Encounter (Signed)
Looks like this was routed to me in error.  Original message sent to [employer]. Will re-route to correct provider.

## 2019-03-18 ENCOUNTER — Other Ambulatory Visit: Payer: Self-pay | Admitting: Internal Medicine

## 2019-03-19 ENCOUNTER — Other Ambulatory Visit: Payer: Self-pay

## 2019-03-19 MED ORDER — LEVOTHYROXINE SODIUM 25 MCG PO TABS: 25.0000 ug | ORAL_TABLET | Freq: Every day | ORAL | 1 refills | Status: DC

## 2019-03-19 NOTE — Telephone Encounter (Signed)
Medication Refill - Medication: carvedilol (COREG) 3.125 MG tablet  levothyroxine (SYNTHROID) 25 MCG tablet     Preferred Pharmacy (with phone number or street name):  [facility] [address], [address]  [place] [address]  Phone: [phone] Fax: [phone]     Agent: Please be advised that RX refills may take up to 3 business days. We ask that you follow-up with your pharmacy.  Patient is completely out of medication and would like a refill as soon as possible please advise

## 2019-03-23 ENCOUNTER — Other Ambulatory Visit: Payer: Self-pay | Admitting: Internal Medicine

## 2019-03-23 MED ORDER — MIRABEGRON ER 25 MG PO TB24: 25.0000 mg | ORAL_TABLET | Freq: Every day | ORAL | 5 refills | Status: DC

## 2019-04-07 DIAGNOSIS — H353211 Exudative age-related macular degeneration, right eye, with active choroidal neovascularization: Secondary | ICD-10-CM | POA: Diagnosis not present

## 2019-05-20 DIAGNOSIS — H353211 Exudative age-related macular degeneration, right eye, with active choroidal neovascularization: Secondary | ICD-10-CM | POA: Diagnosis not present

## 2019-06-10 ENCOUNTER — Other Ambulatory Visit: Payer: Self-pay | Admitting: Cardiovascular Disease

## 2019-06-16 ENCOUNTER — Ambulatory Visit (INDEPENDENT_AMBULATORY_CARE_PROVIDER_SITE_OTHER): Payer: Medicare HMO | Admitting: Internal Medicine

## 2019-06-16 ENCOUNTER — Encounter: Payer: Self-pay | Admitting: Internal Medicine

## 2019-06-16 ENCOUNTER — Other Ambulatory Visit: Payer: Self-pay

## 2019-06-16 VITALS — BP 118/78 | HR 64 | Ht 64.5 in | Wt 172.5 lb

## 2019-06-16 DIAGNOSIS — E782 Mixed hyperlipidemia: Secondary | ICD-10-CM | POA: Diagnosis not present

## 2019-06-16 DIAGNOSIS — R351 Nocturia: Secondary | ICD-10-CM | POA: Diagnosis not present

## 2019-06-16 DIAGNOSIS — D500 Iron deficiency anemia secondary to blood loss (chronic): Secondary | ICD-10-CM

## 2019-06-16 DIAGNOSIS — I10 Essential (primary) hypertension: Secondary | ICD-10-CM | POA: Diagnosis not present

## 2019-06-16 DIAGNOSIS — E039 Hypothyroidism, unspecified: Secondary | ICD-10-CM | POA: Diagnosis not present

## 2019-06-16 DIAGNOSIS — I5189 Other ill-defined heart diseases: Secondary | ICD-10-CM

## 2019-06-16 DIAGNOSIS — Z8781 Personal history of (healed) traumatic fracture: Secondary | ICD-10-CM

## 2019-06-16 NOTE — Progress Notes (Signed)
Telephone Note  This visit type was conducted due to national recommendations for restrictions regarding the COVID-19 pandemic (e.g. social distancing).  This format is felt to be most appropriate for this patient at this time.  All issues noted in this document were discussed and addressed.  No physical exam was performed (except for noted visual exam findings with Video Visits).   I connected with@ on 06/16/19 at  9:30 AM EST by telephone and verified that I am speaking with the correct person using two identifiers. Location patient: home Location provider: work or home office Persons participating in the virtual visit: patient, provider  I discussed the limitations, risks, security and privacy concerns of performing an evaluation and management service by telephone and the availability of in person appointments. I also discussed with the patient that there may be a patient responsible charge related to this service. The patient expressed understanding and agreed to proceed.  Reason for visit: 6 month follow up   HPI:  86 yr old female with CAD s/p 4 vessel CABG , hypertension   History of hip fracture  And DVT    Patient is taking her medications as prescribed and notes no adverse effects.  Home BP readings have been done about once per week and are  generally < 120/80 .  She is avoiding added salt in her diet and walking in the house regularly  for exercise.  OAB:  She states that the Myrbetriq  medication helping with frequency,  Just one void at night   .  Lives alone IADL,    Getting   groceries online. Family checks on her daily   Keeps walker nearby and uses in the house and out.  Her walker has a fold down seat .   ROS: See pertinent positives and negatives per HPI.  Past Medical History:  Diagnosis Date  . Aortic insufficiency    a. 03/2017 Echo: Mod AI.  Marland Kitchen Arthritis   . CAD (coronary artery disease)    a. Lexiscan 10/13/14: mid anterior to apical & inf wall ischemia w/  WMA, mild to mod dep EF; b. Cath 10/2014: LM 50ost, LM 99, ost LAD 95, 40p/m, RCA 30p, 23m; b. 10/2014 CABG x 4 (LIMA->LAD, VG->RCA, VG->RI->OM).  . Depression   . Diastolic dysfunction    a. 10/2014 Echo: EF 55-60%, no RWMA, GR1DD, mild AI, trivial MR, mildly dilated LA, PASP normal; b. 03/2017 Echo: EF 60-65%, mild LVH, Gr2 DD, mod AI, mild MR, mildly dil LA, nl RV fxn, mildly dil RA, mild to mod TR.  Marland Kitchen GERD (gastroesophageal reflux disease)   . Hyperlipidemia   . Hypertension   . Hypothyroidism   . Macular degeneration   . pernicious anemia   . Varicose veins   . Wears dentures    partial upper  . Wears hearing aid    right    Past Surgical History:  Procedure Laterality Date  . ANTERIOR VITRECTOMY Right 07/16/2016   Procedure: ANTERIOR VITRECTOMY;  Surgeon: Eulogio Bear, MD;  Location: [facility];  Service: Ophthalmology;  Laterality: Right;  . CARDIAC CATHETERIZATION Left 10/14/2014   Procedure: Left Heart Cath and Coronary Angiography;  Surgeon: Wellington Hampshire, MD;  Location: [facility];  Service: Cardiovascular;  Laterality: Left;  . CATARACT EXTRACTION W/PHACO Left 04/30/2016   Procedure: CATARACT EXTRACTION PHACO AND INTRAOCULAR LENS PLACEMENT (IOC);  Surgeon: Eulogio Bear, MD;  Location: [facility];  Service: Ophthalmology;  Laterality: Left;  LEFT  .  CATARACT EXTRACTION W/PHACO Right 07/16/2016   Procedure: CATARACT EXTRACTION PHACO AND INTRAOCULAR LENS PLACEMENT ([place])  Right;  Surgeon: Eulogio Bear, MD;  Location: [facility];  Service: Ophthalmology;  Laterality: Right;  . CORONARY ARTERY BYPASS GRAFT N/A 10/14/2014   Procedure: CORONARY ARTERY BYPASS GRAFTING (CABG), ON PUMP, TIMES FOUR, USING LEFT INTERNAL MAMMARY ARTERY, RIGHT GREATER SAPHENOUS VEIN HARVESTED ENDOSCOPICALLY;  Surgeon: Gaye Pollack, MD;  Location: [facility];  Service: Open Heart Surgery;  Laterality: N/A;  . CORONARY ARTERY BYPASS GRAFT  10/2014   LIMA-->LAD,  SVG-->RCA, sequential SVG-->[place] and OM  . HEMORRHOID SURGERY    . HIP ARTHROPLASTY Left 01/14/2015   Procedure: ARTHROPLASTY BIPOLAR HIP (HEMIARTHROPLASTY);  Surgeon: Dereck Leep, MD;  Location: [facility];  Service: Orthopedics;  Laterality: Left;  Marland Kitchen VARICOSE VEIN SURGERY      Family History  Problem Relation Age of Onset  . Coronary artery disease Mother   . Diabetes Mother   . Heart disease Father   . Coronary artery disease Brother   . Breast cancer Neg Hx     SOCIAL HX:  reports that she has never smoked. She has never used smokeless tobacco. She reports that she does not drink alcohol or use drugs.   Current Outpatient Medications:  .  acetaminophen (TYLENOL) 325 MG tablet, Take 2 tablets (650 mg total) by mouth every 6 (six) hours as needed for mild pain., Disp: , Rfl:  .  aspirin 81 MG chewable tablet, Chew 1 tablet (81 mg total) by mouth daily., Disp: 30 tablet, Rfl: 0 .  atorvastatin (LIPITOR) 40 MG tablet, TAKE ONE TABLET BY MOUTH ONCE DAILY, Disp: 90 tablet, Rfl: 0 .  Calcium Carbonate-Vitamin D (CALCIUM + D) 600-200 MG-UNIT TABS, Take 1 tablet by mouth 2 (two) times daily. , Disp: , Rfl:  .  carvedilol (COREG) 3.125 MG tablet, TAKE ONE TABLET BY MOUTH TWICE DAILY WITH A MEAL, Disp: 180 tablet, Rfl: 1 .  Cholecalciferol (D3 DOTS) 2000 UNITS TBDP, Take 2,000 Units by mouth., Disp: , Rfl:  .  levothyroxine (SYNTHROID) 25 MCG tablet, Take 1 tablet (25 mcg total) by mouth daily with breakfast., Disp: 90 tablet, Rfl: 1 .  mirabegron ER (MYRBETRIQ) 25 MG TB24 tablet, Take 1 tablet (25 mg total) by mouth daily., Disp: 30 tablet, Rfl: 5 .  Multiple Vitamin (MULTIVITAMIN WITH MINERALS) TABS tablet, Take 1 tablet by mouth daily., Disp: , Rfl:  .  Multiple Vitamins-Minerals (PRESERVISION AREDS 2) CAPS, Take 1 capsule by mouth 2 (two) times daily. , Disp: , Rfl:  .  pantoprazole (PROTONIX) 40 MG tablet, TAKE ONE TABLET BY MOUTH ONCE DAILY, Disp: 90 tablet, Rfl: 1 .  senna-docusate  (SENOKOT-S) 8.6-50 MG per tablet, Take 1 tablet by mouth 2 (two) times daily., Disp: 30 tablet, Rfl: 0 .  torsemide (DEMADEX) 10 MG tablet, Take 10 mg (1 tablet) by mouth daily for 5 days, then take 1 tablet daily as needed for shortness of breath or weight gain 3 lbs or greater overnight., Disp: 30 tablet, Rfl: 5  EXAM:  VITALS per patient if applicable:  General impression: alert, cooperative and articulate.  No signs of being in distress  Lungs: speech is fluent sentence length suggests that patient is not short of breath and not punctuated by cough, sneezing or sniffing. Marland Kitchen   Psych: affect normal.  speech is articulate and non pressured .   ASSESSMENT AND PLAN:  Discussed the following assessment and plan:  Essential hypertension - Plan:  Comprehensive metabolic panel  Mixed hyperlipidemia - Plan: Lipid panel  Acquired hypothyroidism - Plan: TSH  Iron deficiency anemia due to chronic blood loss - Plan: CBC with Differential/Platelet, Iron, TIBC and Ferritin Panel  History of hip fracture  Nocturia more than twice per night  Diastolic dysfunction  Essential hypertension Well controlled on current regimen. Renal function has been normal  no changes today.  Lab Results  Component Value Date   CREATININE 0.79 02/26/2019     History of hip fracture She is ambulating with a walker and has had no falls   Nocturia more than twice per night Improved with myrbetriq ,  Now voiding once per night   Hypothyroidism Managed with generic synthroid. She is due for surveillance of thyroid function with TSH  Lab Results  Component Value Date   TSH 3.69 [id number]     Diastolic dysfunction BP is well controlled and she has no signs of volume overload     I discussed the assessment and treatment plan with the patient. The patient was provided an opportunity to ask questions and all were answered. The patient agreed with the plan and demonstrated an understanding of the  instructions.   The patient was advised to call back or seek an in-person evaluation if the symptoms worsen or if the condition fails to improve as anticipated.  [name], MD

## 2019-06-17 NOTE — Assessment & Plan Note (Signed)
BP is well controlled and she has no signs of volume overload

## 2019-06-17 NOTE — Assessment & Plan Note (Signed)
She is ambulating with a walker and has had no falls

## 2019-06-17 NOTE — Assessment & Plan Note (Signed)
Improved with myrbetriq ,  Now voiding once per night  °

## 2019-06-17 NOTE — Assessment & Plan Note (Signed)
Well controlled on current regimen. Renal function has been normal  no changes today.  Lab Results  Component Value Date   CREATININE 0.79 02/26/2019

## 2019-06-17 NOTE — Assessment & Plan Note (Signed)
Managed with generic synthroid. She is due for surveillance of thyroid function with TSH  Lab Results  Component Value Date   TSH 3.69 12/31/2018

## 2019-07-01 DIAGNOSIS — H353211 Exudative age-related macular degeneration, right eye, with active choroidal neovascularization: Secondary | ICD-10-CM | POA: Diagnosis not present

## 2019-07-15 ENCOUNTER — Other Ambulatory Visit: Payer: Self-pay | Admitting: Internal Medicine

## 2019-07-22 ENCOUNTER — Telehealth (INDEPENDENT_AMBULATORY_CARE_PROVIDER_SITE_OTHER): Payer: Medicare HMO | Admitting: Family

## 2019-07-22 ENCOUNTER — Other Ambulatory Visit: Payer: Self-pay

## 2019-07-22 ENCOUNTER — Encounter: Payer: Self-pay | Admitting: Family

## 2019-07-22 VITALS — BP 118/78 | Ht 64.5 in | Wt 172.8 lb

## 2019-07-22 DIAGNOSIS — I340 Nonrheumatic mitral (valve) insufficiency: Secondary | ICD-10-CM

## 2019-07-22 DIAGNOSIS — I351 Nonrheumatic aortic (valve) insufficiency: Secondary | ICD-10-CM

## 2019-07-22 DIAGNOSIS — I2510 Atherosclerotic heart disease of native coronary artery without angina pectoris: Secondary | ICD-10-CM

## 2019-07-22 DIAGNOSIS — I10 Essential (primary) hypertension: Secondary | ICD-10-CM

## 2019-07-22 DIAGNOSIS — E785 Hyperlipidemia, unspecified: Secondary | ICD-10-CM | POA: Diagnosis not present

## 2019-07-22 NOTE — Patient Instructions (Signed)
Medication Instructions:  No medication changes today.  *If you need a refill on your cardiac medications before your next appointment, please call your pharmacy*  Lab Work: No lab work ordered today.   Testing/Procedures: None ordered today.  Follow-Up: At [facility], you and your health needs are our priority.  As part of our continuing mission to provide you with exceptional heart care, we have created designated Provider Care Teams.  These Care Teams include your primary Cardiologist (physician) and Advanced Practice Providers (APPs -  Physician Assistants and Nurse Practitioners) who all work together to provide you with the care you need, when you need it.  Your next appointment:   6 month(s)  The format for your next appointment:   In Person  Provider:   You may see [name], MD or one of the following Advanced Practice Providers on your designated Care Team:    Murray Hodgkins, NP  Christell Faith, PA-C  Marrianne Mood, PA-C  Other Instructions  It was a pleasure speaking with you today!  Your blood pressure readings look great. Thank you for having the readings ready for [place].  Keep up the good work with your regular activity and healthy eating.   Continue your current medications. If you find you are needing your Torsemide (the fluid pill) more often than previously, please let us know.

## 2019-07-22 NOTE — Progress Notes (Signed)
Virtual Visit via Telephone Note   This visit type was conducted due to national recommendations for restrictions regarding the COVID-19 Pandemic (e.g. social distancing) in an effort to limit this patient's exposure and mitigate transmission in our community.  Due to her co-morbid illnesses, this patient is at least at moderate risk for complications without adequate follow up.  This format is felt to be most appropriate for this patient at this time.  The patient did not have access to video technology/had technical difficulties with video requiring transitioning to audio format only (telephone).  All issues noted in this document were discussed and addressed.  No physical exam could be performed with this format.  Please refer to the patient's chart for her  consent to telehealth for [facility].   Date:  07/22/2019   ID:  Jenna Marshall, DOB [id number], MRN [id number]  Patient Location: Home Provider Location: Home  PCP:  [name], MD  Cardiologist:  [name], MD  Electrophysiologist:  None   Evaluation Performed:  Follow-Up Visit  Chief Complaint:  Follow up of HTN, MR, AI  History of Present Illness:    Jenna Marshall is a 86 y.o. female with history of CAD s/p CABG x4 in 10/2014 (LIMA-LAD, VG-RCA, VG-RI-OM), diastolic dysfunction, aortic valve insufficiency, HTN, HLD, hypothyroidism, depression, arthritis, GERD.  She was last seen by Christell Faith, PA 01/08/2019.  May 2016 underwent stress testing due to chest discomfort which is abnormal.  Diagnostic cardiac cath with severe LM and ostial LAD disease.  Underwent CABG x4 at that time and no ischemic evaluation since.  3 months post bypass fractured left hip requiring hemiarthroplasty.  Echo 03/2017 normal LV SF, grade 2 diastolic dysfunction, moderate AR, mild MR.  Most recent eco 02/2019 LVEF 0000000, diastolic dysfunction, RV mildly enlarged, RVSP mild-mod elevated 41.83mmHg, LA mildly dilated, mild-moderate MR, moderate  AI.  Recent BP readings at home are 110/70 and 120/70  Takes her fluid pill as needed. She checks her weight regularly. No swelling. Tells me she needs her Torsemide very intermittently. Endorses eating a low sodium diet.   Tells me she wakes up each morning feeling great. Has no concerned regarding her health. Tells me she has "great children" and they come check on her daily. She is very appreciative of their care and assistance.  Stays very active around the home. She uses a "pedaler" daily for exercise as well and does physical therapy exercises she remembers from a prior surgery.   Denies chest pain, pressure, tightness. No shortness of breath nor DOE.  The patient does not have symptoms concerning for COVID-19 infection (fever, chills, cough, or new shortness of breath).    Past Medical History:  Diagnosis Date  . Aortic insufficiency    a. 03/2017 Echo: Mod AI.  Marland Kitchen Arthritis   . CAD (coronary artery disease)    a. Lexiscan 10/13/14: mid anterior to apical & inf wall ischemia w/ WMA, mild to mod dep EF; b. Cath 10/2014: LM 50ost, LM 99, ost LAD 95, 40p/m, RCA 30p, 33m; b. 10/2014 CABG x 4 (LIMA->LAD, VG->RCA, VG->RI->OM).  . Depression   . Diastolic dysfunction    a. 10/2014 Echo: EF 55-60%, no RWMA, GR1DD, mild AI, trivial MR, mildly dilated LA, PASP normal; b. 03/2017 Echo: EF 60-65%, mild LVH, Gr2 DD, mod AI, mild MR, mildly dil LA, nl RV fxn, mildly dil RA, mild to mod TR.  Marland Kitchen GERD (gastroesophageal reflux disease)   . Hyperlipidemia   .  Hypertension   . Hypothyroidism   . Macular degeneration   . pernicious anemia   . Varicose veins   . Wears dentures    partial upper  . Wears hearing aid    right   Past Surgical History:  Procedure Laterality Date  . ANTERIOR VITRECTOMY Right 07/16/2016   Procedure: ANTERIOR VITRECTOMY;  Surgeon: Eulogio Bear, MD;  Location: [place];  Service: Ophthalmology;  Laterality: Right;  . CARDIAC CATHETERIZATION Left 10/14/2014    Procedure: Left Heart Cath and Coronary Angiography;  Surgeon: Wellington Hampshire, MD;  Location: [place] [facility];  Service: Cardiovascular;  Laterality: Left;  . CATARACT EXTRACTION W/PHACO Left 04/30/2016   Procedure: CATARACT EXTRACTION PHACO AND INTRAOCULAR LENS PLACEMENT (IOC);  Surgeon: Eulogio Bear, MD;  Location: [place];  Service: Ophthalmology;  Laterality: Left;  LEFT  . CATARACT EXTRACTION W/PHACO Right 07/16/2016   Procedure: CATARACT EXTRACTION PHACO AND INTRAOCULAR LENS PLACEMENT ([place])  Right;  Surgeon: Eulogio Bear, MD;  Location: [place];  Service: Ophthalmology;  Laterality: Right;  . CORONARY ARTERY BYPASS GRAFT N/A 10/14/2014   Procedure: CORONARY ARTERY BYPASS GRAFTING (CABG), ON PUMP, TIMES FOUR, USING LEFT INTERNAL MAMMARY ARTERY, RIGHT GREATER SAPHENOUS VEIN HARVESTED ENDOSCOPICALLY;  Surgeon: Gaye Pollack, MD;  Location: [place];  Service: Open Heart Surgery;  Laterality: N/A;  . CORONARY ARTERY BYPASS GRAFT  10/2014   LIMA-->LAD, SVG-->RCA, sequential SVG-->[place] and OM  . HEMORRHOID SURGERY    . HIP ARTHROPLASTY Left 01/14/2015   Procedure: ARTHROPLASTY BIPOLAR HIP (HEMIARTHROPLASTY);  Surgeon: Dereck Leep, MD;  Location: [facility];  Service: Orthopedics;  Laterality: Left;  Marland Kitchen VARICOSE VEIN SURGERY       No outpatient medications have been marked as taking for the 07/22/19 encounter (Appointment) with Loel Dubonnet, NP.     Allergies:   Ambien [zolpidem], Codeine, and Lasix [furosemide]   Social History   Tobacco Use  . Smoking status: Never Smoker  . Smokeless tobacco: Never Used  Substance Use Topics  . Alcohol use: No  . Drug use: No     Family Hx: The patient's family history includes Coronary artery disease in her brother and mother; Diabetes in her mother; Heart disease in her father. There is no history of Breast cancer.  ROS:   Please see the history of present illness.    Review of Systems  Constitution:  Negative for chills, fever and malaise/fatigue.  Cardiovascular: Negative for chest pain, dyspnea on exertion, leg swelling, near-syncope, orthopnea, palpitations and syncope.  Respiratory: Negative for cough, shortness of breath and wheezing.   Gastrointestinal: Negative for nausea and vomiting.  Neurological: Negative for dizziness, light-headedness and weakness.   All other systems reviewed and are negative.  Prior CV studies:   The following studies were reviewed today: 2D Echo 02/2019  1. The left ventricle has normal systolic function, with an ejection  fraction of 55-60%. The cavity size was normal. There is severely  increased left ventricular wall thickness. Left ventricular diastolic  Doppler parameters are consistent with  pseudonormalization. Elevated mean left atrial pressure No evidence of  left ventricular regional wall motion abnormalities.   2. The right ventricle has low normal systolic function. The cavity was  mildly enlarged. There is mildly increased right ventricular wall  thickness. Right ventricular systolic pressure is mildly to moderately  elevated with an estimated pressure of 41.9  mmHg.   3. Left atrial size was mildly dilated.   4. The  mitral valve is degenerative. Mild thickening of the mitral valve  leaflet. There is moderate mitral annular calcification present. Mitral  valve regurgitation is mild to moderate by color flow Doppler.   5. Tricuspid valve regurgitation is moderate.   6. The aortic valve is tricuspid. Mild thickening of the aortic valve.  Aortic valve regurgitation is moderate by color flow Doppler.   7. The aorta is normal unless otherwise noted.   2D echo 03/2017: - Left ventricle: The cavity size was at the upper limits of   normal. Wall thickness was increased in a pattern of mild LVH.   Systolic function was normal. The estimated ejection fraction was   in the range of 60% to 65%. Features are consistent with a   pseudonormal left  ventricular filling pattern, with concomitant   abnormal relaxation and increased filling pressure (grade 2   diastolic dysfunction). Doppler parameters are consistent with   high ventricular filling pressure. - Aortic valve: Mildly thickened leaflets. There was moderate   regurgitation. - Mitral valve: Calcified annulus. There was mild regurgitation. - Left atrium: The atrium was mildly dilated. - Right ventricle: The cavity size was mildly dilated. Systolic   function was normal. - Right atrium: The atrium was mildly dilated. - Tricuspid valve: There was mild-moderate regurgitation. _________   LHC 10/2014:  Prox RCA lesion, 30% stenosed.  Mid RCA lesion, 70% stenosed.  Ost LM to LM lesion, 50% stenosed.  LM lesion, 99% stenosed.  Ost LAD lesion, 95% stenosed.  Prox LAD to Mid LAD lesion, 40% stenosed.   Final Conclusions:   1. Critical left main stenosis. Significant distal RCA stenosis. Heavily calcified arteries.  2. Normal EF by echo with no significant AS or MR   Recommendations:  Urgent CABG.   Labs/Other Tests and Data Reviewed:    EKG:  No ECG reviewed.  Recent Labs: 12/31/2018: ALT 13; TSH 3.69 02/26/2019: BUN 24; Creatinine, Ser 0.79; Potassium 4.5; Sodium 135   Recent Lipid Panel Lab Results  Component Value Date/Time   CHOL 133 12/31/2018 08:39 AM   CHOL 114 12/23/2014 08:26 AM   TRIG 82.0 12/31/2018 08:39 AM   HDL 53.40 12/31/2018 08:39 AM   HDL 40 12/23/2014 08:26 AM   CHOLHDL 2 12/31/2018 08:39 AM   LDLCALC 64 12/31/2018 08:39 AM   LDLCALC 56 12/23/2014 08:26 AM   LDLDIRECT 104.4 02/27/2011 11:35 AM    Wt Readings from Last 3 Encounters:  06/16/19 172 lb 8 oz (78.2 kg)  01/08/19 172 lb 8 oz (78.2 kg)  09/01/18 174 lb (78.9 kg)     Objective:    Vital Signs:  There were no vitals taken for this visit.   VITAL SIGNS:  reviewed  ASSESSMENT & PLAN:    1. CAD s/p CABG - Stable with no anginal symptoms. No indication for ischemic  evaluation at this time. Continue GDMT aspirin, beta blocker, statin.   2. Moderate aortic valve insufficiency and mild-moderate mitral regurgitation - Noted on echo 02/2019 with normal LVEF. Her moderate AI was stable compared to previous and MR had progressed from mild to mild-moderate. She is asymptomatic. Continue to monitor BP and volume status. BP well controlled, as below. Continue as-needed Torsemide 10mg  for weight gain. Reports she requires diuretic very rarely.  3. HTN - BP well controlled. Continue antihypertensive regimen including Coreg 3.125mg  BID. Low sodium diet encouraged.   4. HLD, LDL goal <70 - Lipid panel 12/31/18 with LDL 64, at goal. Continue Atorvastatin 40mg  daily.  COVID-19 Education: The signs and symptoms of COVID-19 were discussed with the patient and how to seek care for testing (follow up with PCP or arrange E-visit).  The importance of social distancing was discussed today.  Time:   Today, I have spent 13 minutes with the patient with telehealth technology discussing the above problems.     Medication Adjustments/Labs and Tests Ordered: Current medicines are reviewed at length with the patient today.  Concerns regarding medicines are outlined above.   Tests Ordered: No orders of the defined types were placed in this encounter.   Medication Changes: No orders of the defined types were placed in this encounter.   Follow Up: Follow up  In Person in 6 month(s)  Signed, Loel Dubonnet, NP  07/22/2019 1:18 PM    [facility]

## 2019-07-23 ENCOUNTER — Ambulatory Visit: Payer: Medicare HMO | Admitting: Cardiovascular Disease

## 2019-08-19 ENCOUNTER — Other Ambulatory Visit (INDEPENDENT_AMBULATORY_CARE_PROVIDER_SITE_OTHER): Payer: Medicare HMO

## 2019-08-19 ENCOUNTER — Other Ambulatory Visit: Payer: Self-pay

## 2019-08-19 DIAGNOSIS — D500 Iron deficiency anemia secondary to blood loss (chronic): Secondary | ICD-10-CM | POA: Diagnosis not present

## 2019-08-19 DIAGNOSIS — E782 Mixed hyperlipidemia: Secondary | ICD-10-CM

## 2019-08-19 DIAGNOSIS — I10 Essential (primary) hypertension: Secondary | ICD-10-CM | POA: Diagnosis not present

## 2019-08-19 DIAGNOSIS — H353211 Exudative age-related macular degeneration, right eye, with active choroidal neovascularization: Secondary | ICD-10-CM | POA: Diagnosis not present

## 2019-08-19 DIAGNOSIS — E039 Hypothyroidism, unspecified: Secondary | ICD-10-CM

## 2019-08-19 LAB — CBC WITH DIFFERENTIAL/PLATELET
Basophils Absolute: 0.1 10*3/uL (ref 0.0–0.1)
Basophils Relative: 1 % (ref 0.0–3.0)
Eosinophils Absolute: 0.1 10*3/uL (ref 0.0–0.7)
Eosinophils Relative: 2.4 % (ref 0.0–5.0)
HCT: 36.6 % (ref 36.0–46.0)
Hemoglobin: 12.3 g/dL (ref 12.0–15.0)
Lymphocytes Relative: 17 % (ref 12.0–46.0)
Lymphs Abs: 1.1 10*3/uL (ref 0.7–4.0)
MCHC: 33.5 g/dL (ref 30.0–36.0)
MCV: 95.5 fl (ref 78.0–100.0)
Monocytes Absolute: 0.7 10*3/uL (ref 0.1–1.0)
Monocytes Relative: 11.5 % (ref 3.0–12.0)
Neutro Abs: 4.3 10*3/uL (ref 1.4–7.7)
Neutrophils Relative %: 68.1 % (ref 43.0–77.0)
Platelets: 147 10*3/uL — ABNORMAL LOW (ref 150.0–400.0)
RBC: 3.83 Mil/uL — ABNORMAL LOW (ref 3.87–5.11)
RDW: 14.5 % (ref 11.5–15.5)
WBC: 6.3 10*3/uL (ref 4.0–10.5)

## 2019-08-19 LAB — LIPID PANEL
Cholesterol: 139 mg/dL (ref 0–200)
HDL: 53.2 mg/dL (ref >39.00)
LDL Cholesterol: 69 mg/dL (ref 0–99)
NonHDL: 85.34
Total CHOL/HDL Ratio: 3
Triglycerides: 82 mg/dL (ref 0.0–149.0)
VLDL: 16.4 mg/dL (ref 0.0–40.0)

## 2019-08-19 LAB — COMPREHENSIVE METABOLIC PANEL
ALT: 11 U/L (ref 0–35)
AST: 18 U/L (ref 0–37)
Albumin: 4.2 g/dL (ref 3.5–5.2)
Alkaline Phosphatase: 56 U/L (ref 39–117)
BUN: 22 mg/dL (ref 6–23)
CO2: 30 mEq/L (ref 19–32)
Calcium: 9.4 mg/dL (ref 8.4–10.5)
Chloride: 102 mEq/L (ref 96–112)
Creatinine, Ser: 0.88 mg/dL (ref 0.40–1.20)
GFR: 60.14 mL/min (ref >60.00)
Glucose, Bld: 96 mg/dL (ref 70–99)
Potassium: 4.6 mEq/L (ref 3.5–5.1)
Sodium: 138 mEq/L (ref 135–145)
Total Bilirubin: 0.6 mg/dL (ref 0.2–1.2)
Total Protein: 6.9 g/dL (ref 6.0–8.3)

## 2019-08-19 LAB — TSH: TSH: 4.16 u[IU]/mL (ref 0.35–4.50)

## 2019-08-20 LAB — IRON,TIBC AND FERRITIN PANEL
%SAT: 30 % (calc) (ref 16–45)
Ferritin: 26 ng/mL (ref 16–288)
Iron: 76 ug/dL (ref 45–160)
TIBC: 257 mcg/dL (calc) (ref 250–450)

## 2019-08-30 ENCOUNTER — Other Ambulatory Visit: Payer: Medicare HMO

## 2019-09-08 ENCOUNTER — Other Ambulatory Visit: Payer: Self-pay | Admitting: Internal Medicine

## 2019-09-10 ENCOUNTER — Other Ambulatory Visit: Payer: Self-pay | Admitting: Cardiovascular Disease

## 2019-09-10 ENCOUNTER — Other Ambulatory Visit: Payer: Self-pay

## 2019-09-10 MED ORDER — ATORVASTATIN CALCIUM 40 MG PO TABS: 40.0000 mg | ORAL_TABLET | Freq: Every day | ORAL | 3 refills | Status: DC

## 2019-09-23 ENCOUNTER — Other Ambulatory Visit: Payer: Self-pay | Admitting: Internal Medicine

## 2019-10-07 DIAGNOSIS — H353211 Exudative age-related macular degeneration, right eye, with active choroidal neovascularization: Secondary | ICD-10-CM | POA: Diagnosis not present

## 2019-10-08 DIAGNOSIS — H353211 Exudative age-related macular degeneration, right eye, with active choroidal neovascularization: Secondary | ICD-10-CM | POA: Diagnosis not present

## 2019-10-11 ENCOUNTER — Telehealth: Payer: Self-pay

## 2019-10-11 NOTE — Telephone Encounter (Signed)
Spoke with pt and she stated that she was not aware of an appt being made. She stated that her daughter takes care of all of her appts. I reached out to daughter, Juliann Pulse, and she stated that she was just trying to schedule a 6 month follow up with Dr. Derrel Nip for the pt but wanted to mention the leg pain with difficulty walking to make sure that it was mentioned during her visit. Leg pain with difficulty walking has been on going daughter would like to just have it readdressed because it does seem to be getting worse.

## 2019-10-11 NOTE — Telephone Encounter (Signed)
LMTCB. Pt needs to be further triaged in regards to her leg pain and difficulty walking.

## 2019-11-22 ENCOUNTER — Other Ambulatory Visit: Payer: Self-pay | Admitting: Internal Medicine

## 2019-12-02 DIAGNOSIS — H353211 Exudative age-related macular degeneration, right eye, with active choroidal neovascularization: Secondary | ICD-10-CM | POA: Diagnosis not present

## 2019-12-13 ENCOUNTER — Ambulatory Visit (INDEPENDENT_AMBULATORY_CARE_PROVIDER_SITE_OTHER): Payer: Medicare HMO

## 2019-12-13 VITALS — Ht 64.5 in | Wt 172.0 lb

## 2019-12-13 DIAGNOSIS — Z0000 Encounter for general adult medical examination without abnormal findings: Secondary | ICD-10-CM | POA: Diagnosis not present

## 2019-12-13 NOTE — Progress Notes (Signed)
Subjective:   Jenna Marshall is a 86 y.o. female who presents for Medicare Annual (Subsequent) preventive examination.  Review of Systems    No ROS.  Medicare Wellness Virtual Visit.   Cardiac Risk Factors include: advanced age (>84men, >82 women);hypertension     Objective:    Today's Vitals   12/13/19 1303  Weight: 172 lb (78 kg)  Height: 5' 4.5" (1.638 m)   Body mass index is 29.07 kg/m.  Advanced Directives 12/13/2019 12/10/2018 09/17/2017 09/04/2016 07/16/2016 04/30/2016 01/26/2015  Does Patient Have a Medical Advance Directive? Yes Yes Yes Yes No No No  Type of Paramedic of [place];Living will [place];Living will Healthcare Power of [place];Living will - - -  Does patient want to make changes to medical advance directive? No - Patient declined No - Patient declined No - Patient declined No - Patient declined - No - Patient declined -  Copy of [place] in Chart? Yes - validated most recent copy scanned in chart (See row information) Yes - validated most recent copy scanned in chart (See row information) No - copy requested No - copy requested - - -  Would patient like information on creating a medical advance directive? - - - - No - Patient declined No - Patient declined Yes - Educational materials given    Current Medications (verified) Outpatient Encounter Medications as of 12/13/2019  Medication Sig  . acetaminophen (TYLENOL) 325 MG tablet Take 2 tablets (650 mg total) by mouth every 6 (six) hours as needed for mild pain.  Marland Kitchen aspirin 81 MG chewable tablet Chew 1 tablet (81 mg total) by mouth daily.  Marland Kitchen atorvastatin (LIPITOR) 40 MG tablet Take 1 tablet (40 mg total) by mouth daily.  . Calcium Carbonate-Vitamin D (CALCIUM + D) 600-200 MG-UNIT TABS Take 1 tablet by mouth 2 (two) times daily.   . carvedilol (COREG) 3.125 MG tablet TAKE ONE TABLET BY MOUTH TWICE DAILY WITH A MEAL  .  Cholecalciferol (D3 DOTS) 2000 UNITS TBDP Take 2,000 Units by mouth.  . levothyroxine (SYNTHROID) 25 MCG tablet TAKE ONE TABLET BY MOUTH EVERY MORNING WITH BREAKFAST  . Multiple Vitamin (MULTIVITAMIN WITH MINERALS) TABS tablet Take 1 tablet by mouth daily.  . Multiple Vitamins-Minerals (PRESERVISION AREDS 2) CAPS Take 1 capsule by mouth 2 (two) times daily.   Marland Kitchen MYRBETRIQ 25 MG TB24 tablet TAKE ONE TABLET BY MOUTH ONCE DAILY  . pantoprazole (PROTONIX) 40 MG tablet TAKE ONE TABLET BY MOUTH ONCE DAILY  . senna-docusate (SENOKOT-S) 8.6-50 MG per tablet Take 1 tablet by mouth 2 (two) times daily.  Marland Kitchen torsemide (DEMADEX) 10 MG tablet Take 10 mg (1 tablet) by mouth daily for 5 days, then take 1 tablet daily as needed for shortness of breath or weight gain 3 lbs or greater overnight.  . [DISCONTINUED] citalopram (CELEXA) 20 MG tablet Take 20 mg by mouth daily.     No facility-administered encounter medications on file as of 12/13/2019.    Allergies (verified) Ambien [zolpidem], Codeine, and Lasix [furosemide]   History: Past Medical History:  Diagnosis Date  . Aortic insufficiency    a. 03/2017 Echo: Mod AI.  Marland Kitchen Arthritis   . CAD (coronary artery disease)    a. Lexiscan 10/13/14: mid anterior to apical & inf wall ischemia w/ WMA, mild to mod dep EF; b. Cath 10/2014: LM 50ost, LM 99, ost LAD 95, 40p/m, RCA 30p, 24m; b. 10/2014 CABG x 4 (LIMA->LAD,  VG->RCA, VG->RI->OM).  . Depression   . Diastolic dysfunction    a. 10/2014 Echo: EF 55-60%, no RWMA, GR1DD, mild AI, trivial MR, mildly dilated LA, PASP normal; b. 03/2017 Echo: EF 60-65%, mild LVH, Gr2 DD, mod AI, mild MR, mildly dil LA, nl RV fxn, mildly dil RA, mild to mod TR.  Marland Kitchen GERD (gastroesophageal reflux disease)   . Hyperlipidemia   . Hypertension   . Hypothyroidism   . Macular degeneration   . pernicious anemia   . Varicose veins   . Wears dentures    partial upper  . Wears hearing aid    right   Past Surgical History:  Procedure  Laterality Date  . ANTERIOR VITRECTOMY Right 07/16/2016   Procedure: ANTERIOR VITRECTOMY;  Surgeon: Eulogio Bear, MD;  Location: [facility];  Service: Ophthalmology;  Laterality: Right;  . CARDIAC CATHETERIZATION Left 10/14/2014   Procedure: Left Heart Cath and Coronary Angiography;  Surgeon: Wellington Hampshire, MD;  Location: [facility];  Service: Cardiovascular;  Laterality: Left;  . CATARACT EXTRACTION W/PHACO Left 04/30/2016   Procedure: CATARACT EXTRACTION PHACO AND INTRAOCULAR LENS PLACEMENT (IOC);  Surgeon: Eulogio Bear, MD;  Location: [facility];  Service: Ophthalmology;  Laterality: Left;  LEFT  . CATARACT EXTRACTION W/PHACO Right 07/16/2016   Procedure: CATARACT EXTRACTION PHACO AND INTRAOCULAR LENS PLACEMENT ([place])  Right;  Surgeon: Eulogio Bear, MD;  Location: [facility];  Service: Ophthalmology;  Laterality: Right;  . CORONARY ARTERY BYPASS GRAFT N/A 10/14/2014   Procedure: CORONARY ARTERY BYPASS GRAFTING (CABG), ON PUMP, TIMES FOUR, USING LEFT INTERNAL MAMMARY ARTERY, RIGHT GREATER SAPHENOUS VEIN HARVESTED ENDOSCOPICALLY;  Surgeon: Gaye Pollack, MD;  Location: [facility];  Service: Open Heart Surgery;  Laterality: N/A;  . CORONARY ARTERY BYPASS GRAFT  10/2014   LIMA-->LAD, SVG-->RCA, sequential SVG-->[place] and OM  . HEMORRHOID SURGERY    . HIP ARTHROPLASTY Left 01/14/2015   Procedure: ARTHROPLASTY BIPOLAR HIP (HEMIARTHROPLASTY);  Surgeon: Dereck Leep, MD;  Location: [facility];  Service: Orthopedics;  Laterality: Left;  Marland Kitchen VARICOSE VEIN SURGERY     Family History  Problem Relation Age of Onset  . Coronary artery disease Mother   . Diabetes Mother   . Heart disease Father   . Coronary artery disease Brother   . Breast cancer Neg Hx    Social History   Socioeconomic History  . Marital status: Widowed    Spouse name: Not on file  . Number of children: Not on file  . Years of education: Not on file  . Highest education level: Not on  file  Occupational History  . Not on file  Tobacco Use  . Smoking status: Never Smoker  . Smokeless tobacco: Never Used  Substance and Sexual Activity  . Alcohol use: No  . Drug use: No  . Sexual activity: Never  Other Topics Concern  . Not on file  Social History Narrative   ** Merged History Encounter **       Social Determinants of Health   Financial Resource Strain:   . Difficulty of Paying Living Expenses:   Food Insecurity:   . Worried About Charity fundraiser in the Last Year:   . Arboriculturist in the Last Year:   Transportation Needs:   . Film/video editor (Medical):   Marland Kitchen Lack of Transportation (Non-Medical):   Physical Activity:   . Days of Exercise per Week:   . Minutes of Exercise per Session:  Stress:   . Feeling of Stress :   Social Connections: Moderately Integrated  . Frequency of Communication with Friends and Family: More than three times a week  . Frequency of Social Gatherings with Friends and Family: More than three times a week  . Attends Religious Services: More than 4 times per year  . Active Member of Clubs or Organizations: Yes  . Attends Archivist Meetings: More than 4 times per year  . Marital Status: Widowed    Tobacco Counseling Counseling given: Not Answered   Clinical Intake:  Pre-visit preparation completed: Yes        Diabetes: No  How often do you need to have someone help you when you read instructions, pamphlets, or other written materials from your doctor or pharmacy?: 1 - Never  Interpreter Needed?: No      Activities of Daily Living In your present state of health, do you have any difficulty performing the following activities: 12/13/2019  Hearing? Y  Comment Hearing aids, bilateral  Vision? N  Difficulty concentrating or making decisions? N  Walking or climbing stairs? Y  Dressing or bathing? N  Doing errands, shopping? Y  Preparing Food and eating ? N  Using the Toilet? N  In the past  six months, have you accidently leaked urine? N  Do you have problems with loss of bowel control? N  Managing your Medications? Y  Comment Daughter assist  Managing your Finances? Y  Comment Daughter assist  Housekeeping or managing your Housekeeping? Y  Comment Daughter assist  Some recent data might be hidden    Patient Care Team: [name], MD as PCP - General (Internal Medicine) Wellington Hampshire, MD as PCP - Cardiology (Cardiology) [name], MD (Internal Medicine) Hessie Knows, MD as Consulting Physician (Orthopedic Surgery)  Indicate any recent Medical Services you may have received from other than [facility] providers in the past year (date may be approximate).     Assessment:   This is a routine wellness examination for Gwenith.  I connected with Aretta today by telephone and verified that I am speaking with the correct person using two identifiers. Location patient: home Location provider: work Persons participating in the virtual visit: patient, Marine scientist.    I discussed the limitations, risks, security and privacy concerns of performing an evaluation and management service by telephone and the availability of in person appointments. The patient expressed understanding and verbally consented to this telephonic visit.    Interactive audio and video telecommunications were attempted between this provider and patient, however failed, due to patient having technical difficulties OR patient did not have access to video capability.  We continued and completed visit with audio only.  Some vital signs may be absent or patient reported.   Hearing/Vision screen  Hearing Screening   125Hz  250Hz  500Hz  1000Hz  2000Hz  3000Hz  4000Hz  6000Hz  8000Hz   Right ear:           Left ear:           Comments: Hearing aid, bilateral  Vision Screening Comments: Followed by [facility] Wears corrective lenses Cataract extraction, bilateral Visual acuity not assessed, virtual visit.    They have seen their ophthalmologist in the last 12 months.     Dietary issues and exercise activities discussed: Current Exercise Habits: Home exercise routine, Type of exercise: walking, Intensity: Mild  Goals    . Follow up with Primary Care Provider     Keep all routine maintenance appointments  Depression Screen PHQ 2/9 Scores 12/13/2019 12/10/2018 06/17/2018 09/17/2017 09/04/2016 07/31/2015 03/10/2015  PHQ - 2 Score 0 0 0 0 0 0 0    Fall Risk Fall Risk  12/13/2019 06/16/2019 12/10/2018 06/17/2018 09/17/2017  Falls in the past year? 0 0 0 0 No  Number falls in past yr: 0 - - - -  Injury with Fall? 0 - - - -  Risk Factor Category  - - - - -  Comment - - - - -  Risk for fall due to : Impaired balance/gait - - History of fall(s);Impaired mobility -  Risk for fall due to: Comment - - - - -  Follow up Falls evaluation completed Falls evaluation completed - Falls evaluation completed -    Handrails in use when climbing stairs? Yes  Home free of loose throw rugs in walkways, pet beds, electrical cords, etc? Yes  Adequate lighting in your home to reduce risk of falls? Yes   ASSISTIVE DEVICES UTILIZED TO PREVENT FALLS:  Life alert? Yes  Use of a cane, walker or w/c? Yes  Grab bars in the bathroom? Yes  Shower chair or bench in shower? Yes  Elevated toilet seat or a handicapped toilet? Yes   TIMED UP AND GO:  Was the test performed? No . Virtual visit.   Cognitive Function:  Patient is alert and oriented x3.    6CIT Screen 12/13/2019 12/10/2018 09/17/2017 09/04/2016  What Year? 0 points 0 points 0 points 0 points  What month? 0 points 0 points 0 points 0 points  What time? - 0 points 0 points 0 points  Count back from 20 - 0 points 0 points 0 points  Months in reverse 0 points 0 points 0 points 0 points  Repeat phrase - - 0 points 0 points  Total Score - - 0 0    Immunizations Immunization History  Administered Date(s) Administered  . Influenza Split 02/27/2011, 03/04/2012   . Influenza, High Dose Seasonal PF 03/31/2013, 02/24/2018, 02/24/2019  . Influenza,inj,Quad PF,6+ Mos 03/30/2014  . Influenza-Unspecified 02/06/2015, 02/06/2016, 03/05/2017  . Pneumococcal Conjugate-13 03/30/2014  . Pneumococcal Polysaccharide-23 03/10/2009, 04/19/2015  . Tdap 10/06/2013  . Zoster 03/11/2011  . Zoster Recombinat (Shingrix) 06/25/2018, 12/31/2018    Health Maintenance Health Maintenance  Topic Date Due  . COVID-19 Vaccine (1) Never done  . INFLUENZA VACCINE  01/02/2020  . TETANUS/TDAP  10/07/2023  . DEXA SCAN  Completed  . PNA vac Low Risk Adult  Completed   Covid vaccine completed. Dates unavailable. Agrees to bring immunization record to next office visit.   Dental Screening: Recommended annual dental exams for proper oral hygiene  Community Resource Referral / Chronic Care Management: CRR required this visit?  No   CCM required this visit?  No      Plan:   Keep all routine maintenance appointments.   Follow up 01/17/20 @ 9:30  I have personally reviewed and noted the following in the patient's chart:   . Medical and social history . Use of alcohol, tobacco or illicit drugs  . Current medications and supplements . Functional ability and status . Nutritional status . Physical activity . Advanced directives . List of other physicians . Hospitalizations, surgeries, and ER visits in previous 12 months . Vitals . Screenings to include cognitive, depression, and falls . Referrals and appointments  In addition, I have reviewed and discussed with patient certain preventive protocols, quality metrics, and best practice recommendations. A written personalized care plan for preventive services as  well as general preventive health recommendations were provided to patient via mychart.     Varney Biles, LPN   [id number]

## 2019-12-13 NOTE — Patient Instructions (Addendum)
Jenna Marshall , Thank you for taking time to come for your Medicare Wellness Visit. I appreciate your ongoing commitment to your health goals. Please review the following plan we discussed and let me know if I can assist you in the future.   These are the goals we discussed: Goals    . Follow up with Primary Care Provider     Keep all routine maintenance appointments       This is a list of the screening recommended for you and due dates:  Health Maintenance  Topic Date Due  . COVID-19 Vaccine (1) Never done  . Flu Shot  01/02/2020  . Tetanus Vaccine  10/07/2023  . DEXA scan (bone density measurement)  Completed  . Pneumonia vaccines  Completed    Immunizations Immunization History  Administered Date(s) Administered  . Influenza Split 02/27/2011, 03/04/2012  . Influenza, High Dose Seasonal PF 03/31/2013, 02/24/2018, 02/24/2019  . Influenza,inj,Quad PF,6+ Mos 03/30/2014  . Influenza-Unspecified 02/06/2015, 02/06/2016, 03/05/2017  . Pneumococcal Conjugate-13 03/30/2014  . Pneumococcal Polysaccharide-23 03/10/2009, 04/19/2015  . Tdap 10/06/2013  . Zoster 03/11/2011   Keep all routine maintenance appointments.   Follow up 01/17/20 @ 9:30  Advanced directives: yes on file  Conditions/risks identified: none new  Follow up in one year for your annual wellness visit.   Preventive Care 14 Years and Older, Female Preventive care refers to lifestyle choices and visits with your health care provider that can promote health and wellness. What does preventive care include?  A yearly physical exam. This is also called an annual well check.  Dental exams once or twice a year.  Routine eye exams. Ask your health care provider how often you should have your eyes checked.  Personal lifestyle choices, including:  Daily care of your teeth and gums.  Regular physical activity.  Eating a healthy diet.  Avoiding tobacco and drug use.  Limiting alcohol use.  Practicing safe  sex.  Taking low-dose aspirin every day.  Taking vitamin and mineral supplements as recommended by your health care provider. What happens during an annual well check? The services and screenings done by your health care provider during your annual well check will depend on your age, overall health, lifestyle risk factors, and family history of disease. Counseling  Your health care provider may ask you questions about your:  Alcohol use.  Tobacco use.  Drug use.  Emotional well-being.  Home and relationship well-being.  Sexual activity.  Eating habits.  History of falls.  Memory and ability to understand (cognition).  Work and work Statistician.  Reproductive health. Screening  You may have the following tests or measurements:  Height, weight, and BMI.  Blood pressure.  Lipid and cholesterol levels. These may be checked every 5 years, or more frequently if you are over 89 years old.  Skin check.  Lung cancer screening. You may have this screening every year starting at age 58 if you have a 30-pack-year history of smoking and currently smoke or have quit within the past 15 years.  Fecal occult blood test (FOBT) of the stool. You may have this test every year starting at age 66.  Flexible sigmoidoscopy or colonoscopy. You may have a sigmoidoscopy every 5 years or a colonoscopy every 10 years starting at age 19.  Hepatitis C blood test.  Hepatitis B blood test.  Sexually transmitted disease (STD) testing.  Diabetes screening. This is done by checking your blood sugar (glucose) after you have not eaten for a while (fasting). You  may have this done every 1-3 years.  Bone density scan. This is done to screen for osteoporosis. You may have this done starting at age 60.  Mammogram. This may be done every 1-2 years. Talk to your health care provider about how often you should have regular mammograms. Talk with your health care provider about your test results,  treatment options, and if necessary, the need for more tests. Vaccines  Your health care provider may recommend certain vaccines, such as:  Influenza vaccine. This is recommended every year.  Tetanus, diphtheria, and acellular pertussis (Tdap, Td) vaccine. You may need a Td booster every 10 years.  Zoster vaccine. You may need this after age 2.  Pneumococcal 13-valent conjugate (PCV13) vaccine. One dose is recommended after age 15.  Pneumococcal polysaccharide (PPSV23) vaccine. One dose is recommended after age 78. Talk to your health care provider about which screenings and vaccines you need and how often you need them. This information is not intended to replace advice given to you by your health care provider. Make sure you discuss any questions you have with your health care provider. Document Released: 06/16/2015 Document Revised: 02/07/2016 Document Reviewed: 03/21/2015 Elsevier Interactive Patient Education  2017 [employer] Prevention in the Home Falls can cause injuries. They can happen to people of all ages. There are many things you can do to make your home safe and to help prevent falls. What can I do on the outside of my home?  Regularly fix the edges of walkways and driveways and fix any cracks.  Remove anything that might make you trip as you walk through a door, such as a raised step or threshold.  Trim any bushes or trees on the path to your home.  Use bright outdoor lighting.  Clear any walking paths of anything that might make someone trip, such as rocks or tools.  Regularly check to see if handrails are loose or broken. Make sure that both sides of any steps have handrails.  Any raised decks and porches should have guardrails on the edges.  Have any leaves, snow, or ice cleared regularly.  Use sand or salt on walking paths during winter.  Clean up any spills in your garage right away. This includes oil or grease spills. What can I do in the  bathroom?  Use night lights.  Install grab bars by the toilet and in the tub and shower. Do not use towel bars as grab bars.  Use non-skid mats or decals in the tub or shower.  If you need to sit down in the shower, use a plastic, non-slip stool.  Keep the floor dry. Clean up any water that spills on the floor as soon as it happens.  Remove soap buildup in the tub or shower regularly.  Attach bath mats securely with double-sided non-slip rug tape.  Do not have throw rugs and other things on the floor that can make you trip. What can I do in the bedroom?  Use night lights.  Make sure that you have a light by your bed that is easy to reach.  Do not use any sheets or blankets that are too big for your bed. They should not hang down onto the floor.  Have a firm chair that has side arms. You can use this for support while you get dressed.  Do not have throw rugs and other things on the floor that can make you trip. What can I do in the kitchen?  Clean  up any spills right away.  Avoid walking on wet floors.  Keep items that you use a lot in easy-to-reach places.  If you need to reach something above you, use a strong step stool that has a grab bar.  Keep electrical cords out of the way.  Do not use floor polish or wax that makes floors slippery. If you must use wax, use non-skid floor wax.  Do not have throw rugs and other things on the floor that can make you trip. What can I do with my stairs?  Do not leave any items on the stairs.  Make sure that there are handrails on both sides of the stairs and use them. Fix handrails that are broken or loose. Make sure that handrails are as long as the stairways.  Check any carpeting to make sure that it is firmly attached to the stairs. Fix any carpet that is loose or worn.  Avoid having throw rugs at the top or bottom of the stairs. If you do have throw rugs, attach them to the floor with carpet tape.  Make sure that you have a  light switch at the top of the stairs and the bottom of the stairs. If you do not have them, ask someone to add them for you. What else can I do to help prevent falls?  Wear shoes that:  Do not have high heels.  Have rubber bottoms.  Are comfortable and fit you well.  Are closed at the toe. Do not wear sandals.  If you use a stepladder:  Make sure that it is fully opened. Do not climb a closed stepladder.  Make sure that both sides of the stepladder are locked into place.  Ask someone to hold it for you, if possible.  Clearly mark and make sure that you can see:  Any grab bars or handrails.  First and last steps.  Where the edge of each step is.  Use tools that help you move around (mobility aids) if they are needed. These include:  Canes.  Walkers.  Scooters.  Crutches.  Turn on the lights when you go into a dark area. Replace any light bulbs as soon as they burn out.  Set up your furniture so you have a clear path. Avoid moving your furniture around.  If any of your floors are uneven, fix them.  If there are any pets around you, be aware of where they are.  Review your medicines with your doctor. Some medicines can make you feel dizzy. This can increase your chance of falling. Ask your doctor what other things that you can do to help prevent falls. This information is not intended to replace advice given to you by your health care provider. Make sure you discuss any questions you have with your health care provider. Document Released: 03/16/2009 Document Revised: 10/26/2015 Document Reviewed: 06/24/2014 Elsevier Interactive Patient Education  2017 [employer].

## 2019-12-23 ENCOUNTER — Other Ambulatory Visit: Payer: Self-pay | Admitting: Internal Medicine

## 2020-01-07 ENCOUNTER — Other Ambulatory Visit: Payer: Self-pay | Admitting: Internal Medicine

## 2020-01-17 ENCOUNTER — Ambulatory Visit (INDEPENDENT_AMBULATORY_CARE_PROVIDER_SITE_OTHER): Payer: Medicare HMO | Admitting: Internal Medicine

## 2020-01-17 ENCOUNTER — Other Ambulatory Visit: Payer: Self-pay

## 2020-01-17 ENCOUNTER — Encounter: Payer: Self-pay | Admitting: Internal Medicine

## 2020-01-17 VITALS — BP 138/84 | HR 57 | Temp 98.3°F | Resp 16 | Ht 64.5 in | Wt 173.2 lb

## 2020-01-17 DIAGNOSIS — E7800 Pure hypercholesterolemia, unspecified: Secondary | ICD-10-CM | POA: Diagnosis not present

## 2020-01-17 DIAGNOSIS — Z79899 Other long term (current) drug therapy: Secondary | ICD-10-CM

## 2020-01-17 DIAGNOSIS — G8929 Other chronic pain: Secondary | ICD-10-CM

## 2020-01-17 DIAGNOSIS — E034 Atrophy of thyroid (acquired): Secondary | ICD-10-CM | POA: Diagnosis not present

## 2020-01-17 DIAGNOSIS — Z8781 Personal history of (healed) traumatic fracture: Secondary | ICD-10-CM | POA: Diagnosis not present

## 2020-01-17 DIAGNOSIS — M25552 Pain in left hip: Secondary | ICD-10-CM | POA: Diagnosis not present

## 2020-01-17 DIAGNOSIS — I10 Essential (primary) hypertension: Secondary | ICD-10-CM | POA: Diagnosis not present

## 2020-01-17 DIAGNOSIS — D508 Other iron deficiency anemias: Secondary | ICD-10-CM

## 2020-01-17 DIAGNOSIS — I2510 Atherosclerotic heart disease of native coronary artery without angina pectoris: Secondary | ICD-10-CM

## 2020-01-17 DIAGNOSIS — I5189 Other ill-defined heart diseases: Secondary | ICD-10-CM

## 2020-01-17 LAB — IBC + FERRITIN
Ferritin: 14.4 ng/mL (ref 10.0–291.0)
Iron: 66 ug/dL (ref 42–145)
Saturation Ratios: 24.2 % (ref 20.0–50.0)
Transferrin: 195 mg/dL — ABNORMAL LOW (ref 212.0–360.0)

## 2020-01-17 LAB — COMPREHENSIVE METABOLIC PANEL
ALT: 10 U/L (ref 0–35)
AST: 17 U/L (ref 0–37)
Albumin: 4 g/dL (ref 3.5–5.2)
Alkaline Phosphatase: 54 U/L (ref 39–117)
BUN: 18 mg/dL (ref 6–23)
CO2: 27 mEq/L (ref 19–32)
Calcium: 9.1 mg/dL (ref 8.4–10.5)
Chloride: 99 mEq/L (ref 96–112)
Creatinine, Ser: 0.78 mg/dL (ref 0.40–1.20)
GFR: 69.06 mL/min (ref >60.00)
Glucose, Bld: 98 mg/dL (ref 70–99)
Potassium: 4.7 mEq/L (ref 3.5–5.1)
Sodium: 135 mEq/L (ref 135–145)
Total Bilirubin: 0.6 mg/dL (ref 0.2–1.2)
Total Protein: 6.4 g/dL (ref 6.0–8.3)

## 2020-01-17 LAB — LIPID PANEL
Cholesterol: 132 mg/dL (ref 0–200)
HDL: 51.6 mg/dL (ref >39.00)
LDL Cholesterol: 66 mg/dL (ref 0–99)
NonHDL: 80.27
Total CHOL/HDL Ratio: 3
Triglycerides: 73 mg/dL (ref 0.0–149.0)
VLDL: 14.6 mg/dL (ref 0.0–40.0)

## 2020-01-17 LAB — CBC WITH DIFFERENTIAL/PLATELET
Basophils Absolute: 0.1 10*3/uL (ref 0.0–0.1)
Basophils Relative: 1.2 % (ref 0.0–3.0)
Eosinophils Absolute: 0.1 10*3/uL (ref 0.0–0.7)
Eosinophils Relative: 2.2 % (ref 0.0–5.0)
HCT: 35.1 % — ABNORMAL LOW (ref 36.0–46.0)
Hemoglobin: 12 g/dL (ref 12.0–15.0)
Lymphocytes Relative: 13.5 % (ref 12.0–46.0)
Lymphs Abs: 0.8 10*3/uL (ref 0.7–4.0)
MCHC: 34.2 g/dL (ref 30.0–36.0)
MCV: 93.9 fl (ref 78.0–100.0)
Monocytes Absolute: 0.6 10*3/uL (ref 0.1–1.0)
Monocytes Relative: 10 % (ref 3.0–12.0)
Neutro Abs: 4.6 10*3/uL (ref 1.4–7.7)
Neutrophils Relative %: 73.1 % (ref 43.0–77.0)
Platelets: 146 10*3/uL — ABNORMAL LOW (ref 150.0–400.0)
RBC: 3.74 Mil/uL — ABNORMAL LOW (ref 3.87–5.11)
RDW: 14.9 % (ref 11.5–15.5)
WBC: 6.2 10*3/uL (ref 4.0–10.5)

## 2020-01-17 LAB — TSH: TSH: 3.84 u[IU]/mL (ref 0.35–4.50)

## 2020-01-17 NOTE — Assessment & Plan Note (Signed)
Asymptomatic.  Continue medication management.

## 2020-01-17 NOTE — Assessment & Plan Note (Addendum)
BP is well controlled and she has no symptoms of heart failure at this time.  Continue carvedilol bid and torsemide prn weight gain per CHF protocol

## 2020-01-17 NOTE — Assessment & Plan Note (Signed)
Left hip,  Remotely.  Left leg aches with prolonged activity some days, but other days doesn't hurt at all.

## 2020-01-17 NOTE — Assessment & Plan Note (Signed)
Secondary to prior fracture.  Tylenol  Advised for daily used.  Motrin prn sparingly

## 2020-01-17 NOTE — Patient Instructions (Addendum)
Your leg pain sounds like arthritis in the hip joint  You can take up to 2000 mg of acetominophen (tylenol) every day safely  In divided doses (500 mg every 6 hours  Or 1000 mg every 12 hours.)  Use motrin or advil SPARINGLY  NOT DAILY    GET THE FLU VACCINE IN LATE September OR OCTOBER  SEPARATE YOUR COVID BOOSTER AND YOUR FLU VACCINE BY 4 WEEKS

## 2020-01-17 NOTE — Progress Notes (Addendum)
Subjective:  Patient ID: Jenna Marshall, female    DOB: [birth year]  Age: 86 y.o. MRN: [id number]  CC: The primary encounter diagnosis was Long-term use of high-risk medication. Diagnoses of History of hip fracture, Other iron deficiency anemia, Pure hypercholesterolemia, Hypothyroidism due to acquired atrophy of thyroid, Chronic left hip pain, Essential hypertension, Coronary artery disease involving native coronary artery of native heart without angina pectoris, and Diastolic dysfunction were also pertinent to this visit.  HPI Jenna Marshall presents for follow up on CAD,  hypothyroidism and   Hypertension  This visit occurred during the SARS-CoV-2 public health emergency.  Safety protocols were in place, including screening questions prior to the visit, additional usage of staff PPE, and extensive cleaning of exam room while observing appropriate contact time as indicated for disinfecting solutions.    Cc:  Worsening leg pain aggravated by walking . Left hip.  Not daily.  Not limiting her activity.  Using a walker.  history of left hip fracture   2) HTN:  Patient is taking her medications as prescribed and notes no adverse effects.  Home BP readings have been done about once per week and are  generally < 130/80 .  She is avoiding added salt in her diet and walking regularly about 3 times per week for exercise  .  No recent falls.  Has two walkers,  2 daughters close by.    3) CAD with diastolic dysfunction,  Normal EF by 2020 ECHO :  denies chest pain  Dyspnea and orthopnea.  Taking meds as directed , including carvedilol .  Torsemide prn weight gain.   4) Cardiomegaly on prior chest x ray.  Reviewed prior cardiac workup/ECHO.  She has diastolic dysfunction .  She is asymptomatic.   Marland Kitchen Outpatient Medications Prior to Visit  Medication Sig Dispense Refill  . acetaminophen (TYLENOL) 325 MG tablet Take 2 tablets (650 mg total) by mouth every 6 (six) hours as needed for mild pain.    Marland Kitchen  aspirin 81 MG chewable tablet Chew 1 tablet (81 mg total) by mouth daily. 30 tablet 0  . atorvastatin (LIPITOR) 40 MG tablet Take 1 tablet (40 mg total) by mouth daily. 90 tablet 3  . Calcium Carbonate-Vitamin D (CALCIUM + D) 600-200 MG-UNIT TABS Take 1 tablet by mouth 2 (two) times daily.     . carvedilol (COREG) 3.125 MG tablet TAKE ONE TABLET BY MOUTH TWICE DAILY WITH A MEAL 180 tablet 1  . Cholecalciferol (D3 DOTS) 2000 UNITS TBDP Take 2,000 Units by mouth.    . levothyroxine (SYNTHROID) 25 MCG tablet TAKE ONE TABLET BY MOUTH EVERY MORNING WITH BREAKFAST 90 tablet 0  . Multiple Vitamin (MULTIVITAMIN WITH MINERALS) TABS tablet Take 1 tablet by mouth daily.    . Multiple Vitamins-Minerals (PRESERVISION AREDS 2) CAPS Take 1 capsule by mouth 2 (two) times daily.     Marland Kitchen MYRBETRIQ 25 MG TB24 tablet TAKE ONE TABLET BY MOUTH ONCE DAILY 30 tablet 5  . pantoprazole (PROTONIX) 40 MG tablet TAKE ONE TABLET BY MOUTH ONCE DAILY 90 tablet 1  . senna-docusate (SENOKOT-S) 8.6-50 MG per tablet Take 1 tablet by mouth 2 (two) times daily. 30 tablet 0  . torsemide (DEMADEX) 10 MG tablet Take 10 mg (1 tablet) by mouth daily for 5 days, then take 1 tablet daily as needed for shortness of breath or weight gain 3 lbs or greater overnight. 30 tablet 5   No facility-administered medications prior to visit.    Review  of Systems;  Patient denies headache, fevers, malaise, unintentional weight loss, skin rash, eye pain, sinus congestion and sinus pain, sore throat, dysphagia,  hemoptysis , cough, dyspnea, wheezing, chest pain, palpitations, orthopnea, edema, abdominal pain, nausea, melena, diarrhea, constipation, flank pain, dysuria, hematuria, urinary  Frequency, nocturia, numbness, tingling, seizures,  Focal weakness, Loss of consciousness,  Tremor, insomnia, depression, anxiety, and suicidal ideation.      Objective:  BP 138/84 (BP Location: Left Arm, Patient Position: Sitting, Cuff Size: Normal)   Pulse (!) 57    Temp 98.3 F (36.8 C) (Oral)   Resp 16   Ht 5' 4.5" (1.638 m)   Wt 173 lb 3.2 oz (78.6 kg)   SpO2 99%   BMI 29.27 kg/m   BP Readings from Last 3 Encounters:  01/17/20 138/84  07/22/19 118/78  06/16/19 118/78    Wt Readings from Last 3 Encounters:  01/17/20 173 lb 3.2 oz (78.6 kg)  12/13/19 172 lb (78 kg)  07/22/19 172 lb 12.8 oz (78.4 kg)    General appearance: alert, cooperative and appears stated age Ears: normal TM's and external ear canals both ears Throat: lips, mucosa, and tongue normal; teeth and gums normal Neck: no adenopathy, no carotid bruit, supple, symmetrical, trachea midline and thyroid not enlarged, symmetric, no tenderness/mass/nodules Back: symmetric, no curvature. ROM normal. No CVA tenderness. Lungs: clear to auscultation bilaterally Heart: regular rate and rhythm, S1, S2 normal, no murmur, click, rub or gallop Abdomen: soft, non-tender; bowel sounds normal; no masses,  no organomegaly Pulses: 2+ and symmetric Skin: Skin color, texture, turgor normal. No rashes or lesions Lymph nodes: Cervical, supraclavicular, and axillary nodes normal.  No results found for: HGBA1C  Lab Results  Component Value Date   CREATININE 0.78 01/17/2020   CREATININE 0.88 08/19/2019   CREATININE 0.79 02/26/2019    Lab Results  Component Value Date   WBC 6.2 01/17/2020   HGB 12.0 01/17/2020   HCT 35.1 (L) 01/17/2020   PLT 146.0 (L) 01/17/2020   GLUCOSE 98 01/17/2020   CHOL 132 01/17/2020   TRIG 73.0 01/17/2020   HDL 51.60 01/17/2020   LDLDIRECT 104.4 02/27/2011   LDLCALC 66 01/17/2020   ALT 10 01/17/2020   AST 17 01/17/2020   NA 135 01/17/2020   K 4.7 01/17/2020   CL 99 01/17/2020   CREATININE 0.78 01/17/2020   BUN 18 01/17/2020   CO2 27 01/17/2020   TSH 3.84 01/17/2020   INR 1.64 (H) 10/14/2014    No results found.  Assessment & Plan:   Problem List Items Addressed This Visit      Unprioritized   Long-term use of high-risk medication - Primary    Relevant Orders   Comprehensive metabolic panel (Completed)   Comprehensive metabolic panel   Hypothyroidism (Chronic)   Relevant Orders   TSH (Completed)   TSH   CAD (coronary artery disease)    Asymptomatic.  Continue medication management.        Chronic left hip pain    Secondary to prior fracture.  Tylenol  Advised for daily used.  Motrin prn sparingly      Essential hypertension (Chronic)    With diastolic dysfunction.  Controlled on current regimen      History of hip fracture    Left hip,  Remotely.  Left leg aches with prolonged activity some days, but other days doesn't hurt at all.        Diastolic dysfunction    BP is well controlled and she has  no symptoms of heart failure at this time.  Continue carvedilol bid and torsemide prn weight gain per CHF protocol      Hyperlipidemia (Chronic)    Lipids are at goal.  Lab Results  Component Value Date   CHOL 132 01/17/2020   HDL 51.60 01/17/2020   LDLCALC 66 01/17/2020   LDLDIRECT 104.4 02/27/2011   TRIG 73.0 01/17/2020   CHOLHDL 3 01/17/2020         Relevant Orders   Lipid panel (Completed)   Iron deficiency anemia    Current iron stores are normal and she is not anemic.  She is no longer taking iron supplements.  Will repeat in 6 months   Lab Results  Component Value Date   IRON 66 01/17/2020   TIBC 257 08/19/2019   FERRITIN 14.4 01/17/2020   Lab Results  Component Value Date   WBC 6.2 01/17/2020   HGB 12.0 01/17/2020   HCT 35.1 (L) 01/17/2020   MCV 93.9 01/17/2020   PLT 146.0 (L) 01/17/2020         Relevant Orders   CBC with Differential/Platelet (Completed)   IBC + Ferritin (Completed)   CBC with Differential/Platelet   Iron, TIBC and Ferritin Panel     I provided  30 minutes of  face-to-face time during this encounter reviewing patient's current problems and past surgeries, labs and imaging studies, providing counseling on the above mentioned problems , and coordination  of care .  I  am having Hagen K. Buel maintain her Calcium Carbonate-Vitamin D, multivitamin with minerals, acetaminophen, PreserVision AREDS 2, aspirin, senna-docusate, Cholecalciferol, torsemide, atorvastatin, Myrbetriq, pantoprazole, levothyroxine, and carvedilol.  No orders of the defined types were placed in this encounter.   There are no discontinued medications.  Follow-up: No follow-ups on file.   [name], MD

## 2020-01-17 NOTE — Assessment & Plan Note (Signed)
With diastolic dysfunction.  Controlled on current regimen

## 2020-01-18 NOTE — Assessment & Plan Note (Signed)
Lipids are at goal.  Lab Results  Component Value Date   CHOL 132 01/17/2020   HDL 51.60 01/17/2020   LDLCALC 66 01/17/2020   LDLDIRECT 104.4 02/27/2011   TRIG 73.0 01/17/2020   CHOLHDL 3 01/17/2020

## 2020-01-18 NOTE — Assessment & Plan Note (Signed)
Current iron stores are normal and she is not anemic.  She is no longer taking iron supplements.  Will repeat in 6 months   Lab Results  Component Value Date   IRON 66 01/17/2020   TIBC 257 08/19/2019   FERRITIN 14.4 01/17/2020   Lab Results  Component Value Date   WBC 6.2 01/17/2020   HGB 12.0 01/17/2020   HCT 35.1 (L) 01/17/2020   MCV 93.9 01/17/2020   PLT 146.0 (L) 01/17/2020

## 2020-01-18 NOTE — Addendum Note (Signed)
Addended by: [name] on: 01/18/2020 09:34 AM   Modules accepted: Orders

## 2020-01-21 ENCOUNTER — Ambulatory Visit: Payer: Medicare HMO | Admitting: Cardiovascular Disease

## 2020-01-26 ENCOUNTER — Other Ambulatory Visit: Payer: Self-pay

## 2020-01-26 ENCOUNTER — Encounter: Payer: Self-pay | Admitting: Nurse Practitioner

## 2020-01-26 ENCOUNTER — Ambulatory Visit: Payer: Medicare HMO | Admitting: Nurse Practitioner

## 2020-01-26 VITALS — BP 180/72 | HR 64 | Ht 64.5 in | Wt 171.2 lb

## 2020-01-26 DIAGNOSIS — I351 Nonrheumatic aortic (valve) insufficiency: Secondary | ICD-10-CM | POA: Diagnosis not present

## 2020-01-26 DIAGNOSIS — I10 Essential (primary) hypertension: Secondary | ICD-10-CM

## 2020-01-26 DIAGNOSIS — I2510 Atherosclerotic heart disease of native coronary artery without angina pectoris: Secondary | ICD-10-CM

## 2020-01-26 DIAGNOSIS — I5032 Chronic diastolic (congestive) heart failure: Secondary | ICD-10-CM | POA: Diagnosis not present

## 2020-01-26 DIAGNOSIS — E785 Hyperlipidemia, unspecified: Secondary | ICD-10-CM | POA: Diagnosis not present

## 2020-01-26 NOTE — Patient Instructions (Signed)
Medication Instructions:  - Your physician recommends that you continue on your current medications as directed. Please refer to the Current Medication list given to you today.  *If you need a refill on your cardiac medications before your next appointment, please call your pharmacy*   Lab Work: - none ordered  If you have labs (blood work) drawn today and your tests are completely normal, you will receive your results only by: Marland Kitchen MyChart Message (if you have MyChart) OR . A paper copy in the mail If you have any lab test that is abnormal or we need to change your treatment, we will call you to review the results.   Testing/Procedures: - none ordered   Follow-Up: At [facility], you and your health needs are our priority.  As part of our continuing mission to provide you with exceptional heart care, we have created designated Provider Care Teams.  These Care Teams include your primary Cardiologist (physician) and Advanced Practice Providers (APPs -  Physician Assistants and Nurse Practitioners) who all work together to provide you with the care you need, when you need it.  We recommend signing up for the patient portal called "MyChart".  Sign up information is provided on this After Visit Summary.  MyChart is used to connect with patients for Virtual Visits (Telemedicine).  Patients are able to view lab/test results, encounter notes, upcoming appointments, etc.  Non-urgent messages can be sent to your provider as well.   To learn more about what you can do with MyChart, go to [email or web address].    Your next appointment:   6 month(s)  The format for your next appointment:   In Person  Provider:    You may see [name], MD or one of the following Advanced Practice Providers on your designated Care Team:    Murray Hodgkins, NP  Christell Faith, PA-C  Marrianne Mood, PA-C    Other Instructions  1) Please check some blood pressure/ heart rate readings at home over  the next 2 weeks, then call the office at [phone] or send a MyChart message with your readings.

## 2020-01-26 NOTE — Progress Notes (Addendum)
Office Visit    Patient Name: Jenna Marshall Date of Encounter: 01/26/2020  Primary Care Provider:  [name], MD Primary Cardiologist:  [name], MD  Chief Complaint    86 year old female with a history of CAD status post CABG, hypertension, hyperlipidemia, hypothyroidism, GERD, diastolic dysfunction, depression, arthritis, and aortic insufficiency, who presents for follow-up of multiple cardiac issues.  Past Medical History    Past Medical History:  Diagnosis Date  . Aortic insufficiency    a. 03/2017 Echo: Mod AI; b. 02/2019 Echo: Mod AI.  Marland Kitchen Arthritis   . CAD (coronary artery disease)    a. Lexiscan 10/13/14: mid anterior to apical & inf wall ischemia w/ WMA, mild to mod dep EF; b. Cath 10/2014: LM 50ost, LM 99, ost LAD 95, 40p/m, RCA 30p, 66m; b. 10/2014 CABG x 4 (LIMA->LAD, VG->RCA, VG->RI->OM).  . Depression   . Diastolic dysfunction    a. 10/2014 Echo: EF 55-60%, no RWMA, GR1DD; b. 03/2017 Echo: EF 60-65%, mild LVH, Gr2 DD; c. 02/2019 Echo: EF 55-60%, sev LVH. Diast dysfxn. No rwma. RVSP 41.81mmHg. Mildly dil LA. Mild to mod MR. Mod TR. Mod AI.  Marland Kitchen GERD (gastroesophageal reflux disease)   . Hyperlipidemia   . Hypertension   . Hypothyroidism   . Macular degeneration   . pernicious anemia   . Varicose veins   . Wears dentures    partial upper  . Wears hearing aid    right   Past Surgical History:  Procedure Laterality Date  . ANTERIOR VITRECTOMY Right 07/16/2016   Procedure: ANTERIOR VITRECTOMY;  Surgeon: Eulogio Bear, MD;  Location: [place];  Service: Ophthalmology;  Laterality: Right;  . CARDIAC CATHETERIZATION Left 10/14/2014   Procedure: Left Heart Cath and Coronary Angiography;  Surgeon: Wellington Hampshire, MD;  Location: [place] [facility];  Service: Cardiovascular;  Laterality: Left;  . CATARACT EXTRACTION W/PHACO Left 04/30/2016   Procedure: CATARACT EXTRACTION PHACO AND INTRAOCULAR LENS PLACEMENT (IOC);  Surgeon: Eulogio Bear, MD;   Location: [place];  Service: Ophthalmology;  Laterality: Left;  LEFT  . CATARACT EXTRACTION W/PHACO Right 07/16/2016   Procedure: CATARACT EXTRACTION PHACO AND INTRAOCULAR LENS PLACEMENT ([place])  Right;  Surgeon: Eulogio Bear, MD;  Location: [place];  Service: Ophthalmology;  Laterality: Right;  . CORONARY ARTERY BYPASS GRAFT N/A 10/14/2014   Procedure: CORONARY ARTERY BYPASS GRAFTING (CABG), ON PUMP, TIMES FOUR, USING LEFT INTERNAL MAMMARY ARTERY, RIGHT GREATER SAPHENOUS VEIN HARVESTED ENDOSCOPICALLY;  Surgeon: Gaye Pollack, MD;  Location: [place];  Service: Open Heart Surgery;  Laterality: N/A;  . CORONARY ARTERY BYPASS GRAFT  10/2014   LIMA-->LAD, SVG-->RCA, sequential SVG-->[place] and OM  . HEMORRHOID SURGERY    . HIP ARTHROPLASTY Left 01/14/2015   Procedure: ARTHROPLASTY BIPOLAR HIP (HEMIARTHROPLASTY);  Surgeon: Dereck Leep, MD;  Location: [facility];  Service: Orthopedics;  Laterality: Left;  Marland Kitchen VARICOSE VEIN SURGERY      Allergies  Allergies  Allergen Reactions  . Ambien [Zolpidem] Other (See Comments)    Reaction:  Hallucinations  . Codeine Nausea And Vomiting  . Lasix [Furosemide] Itching and Rash    History of Present Illness    86 year old female with above complex past medical history including CAD, hypertension, hyperlipidemia, GERD, hypothyroidism, diastolic dysfunction, aortic insufficiency, depression, and arthritis.  In May 2016, she underwent stress testing in the setting of chest discomfort, which was abnormal.  This was followed by catheterization which showed severe left main and  ostial LAD disease.  She subsequently underwent CABG x4.  About 3 months after her bypass surgery, she fractured her left hip and required left hip hemiarthroplasty.  She recovered well.  Most recent echocardiogram was in September 2020 showing an EF of 55-60%, severe LVH, diastolic dysfunction, elevated RVSP of 41.9 mmHg, moderate AI, mild to moderate MR, and moderate  TR.  She was last seen via video visit in February of this year, at which time she was doing well and only using low-dose torsemide on an as-needed basis, which was rare.  Over the past 6 months, she has continued to do well.  She lives independently but has family that visits every day.  She ambulates with a walker and says that she stays busy throughout the day.  She is "not someone to sit around."  She denies chest pain, dyspnea, palpitations, PND, orthopnea, dizziness, syncope, or early satiety.  She occasionally notes mild ankle edema.  She does weigh herself daily and uses as needed torsemide once a week or less.  Her blood pressure is elevated today.  Her daughter is present and says that when she has checked it at home in the past, she would sometimes see elevated numbers which she could tell caused anxiety for the patient and so she stopped checking.  On other occasions, they noted normal blood pressures.  At primary care visit on August 16, blood pressure was 138/84.  Home Medications    Prior to Admission medications   Medication Sig Start Date End Date Taking? Authorizing Provider  acetaminophen (TYLENOL) 325 MG tablet Take 2 tablets (650 mg total) by mouth every 6 (six) hours as needed for mild pain. 10/21/14   Nani Skillern, PA-C  aspirin 81 MG chewable tablet Chew 1 tablet (81 mg total) by mouth daily. 01/19/15   Gladstone Lighter, MD  atorvastatin (LIPITOR) 40 MG tablet Take 1 tablet (40 mg total) by mouth daily. 09/10/19   Wellington Hampshire, MD  Calcium Carbonate-Vitamin D (CALCIUM + D) 600-200 MG-UNIT TABS Take 1 tablet by mouth 2 (two) times daily.     [provider]  carvedilol (COREG) 3.125 MG tablet TAKE ONE TABLET BY MOUTH TWICE DAILY WITH A MEAL 01/07/20   [name], MD  Cholecalciferol (D3 DOTS) 2000 UNITS TBDP Take 2,000 Units by mouth.    [provider]  levothyroxine (SYNTHROID) 25 MCG tablet TAKE ONE TABLET BY MOUTH EVERY MORNING WITH BREAKFAST  01/07/20   [name], MD  Multiple Vitamin (MULTIVITAMIN WITH MINERALS) TABS tablet Take 1 tablet by mouth daily.    [provider]  Multiple Vitamins-Minerals (PRESERVISION AREDS 2) CAPS Take 1 capsule by mouth 2 (two) times daily.     [provider]  MYRBETRIQ 25 MG TB24 tablet TAKE ONE TABLET BY MOUTH ONCE DAILY 09/23/19   [name], MD  pantoprazole (PROTONIX) 40 MG tablet TAKE ONE TABLET BY MOUTH ONCE DAILY 12/23/19   [name], MD  senna-docusate (SENOKOT-S) 8.6-50 MG per tablet Take 1 tablet by mouth 2 (two) times daily. 01/19/15   Gladstone Lighter, MD  torsemide (DEMADEX) 10 MG tablet Take 10 mg (1 tablet) by mouth daily for 5 days, then take 1 tablet daily as needed for shortness of breath or weight gain 3 lbs or greater overnight. 02/12/19   Dunn, Areta Haber, PA-C  citalopram (CELEXA) 20 MG tablet Take 20 mg by mouth daily.    08/28/11  [provider]    Review  of Systems    Occasional mild ankle edema, for which she uses as needed torsemide about once a week or so.  She denies chest pain, dyspnea, palpitations, PND, orthopnea, dizziness, syncope, or early satiety.  All other systems reviewed and are otherwise negative except as noted above.  Physical Exam    VS:  BP (!) 180/72 (BP Location: Left Arm, Patient Position: Sitting, Cuff Size: Normal)   Pulse 64   Ht 5' 4.5" (1.638 m)   Wt 171 lb 3.2 oz (77.7 kg)   SpO2 94%   BMI 28.93 kg/m  , BMI Body mass index is 28.93 kg/m. GEN: Well nourished, well developed, in no acute distress. HEENT: normal. Neck: Supple, no JVD, carotid bruits, or masses. Cardiac: RRR, 2/6 systolic murmur loudest at the upper sternal borders but heard throughout, no rubs, or gallops. No clubbing, cyanosis, edema.  Radials/PT 1+ and equal bilaterally.  Respiratory:  Respirations regular and unlabored, clear to auscultation bilaterally. GI: Soft, nontender, nondistended, BS + x 4. MS: no deformity or atrophy. Skin:  warm and dry, no rash. Neuro:  Strength and sensation are intact. Psych: Normal affect.  Accessory Clinical Findings    ECG personally reviewed by me today -regular sinus rhythm, 64, left axis deviation, prior anteroseptal infarct- no acute changes.  Lab Results  Component Value Date   WBC 6.2 01/17/2020   HGB 12.0 01/17/2020   HCT 35.1 (L) 01/17/2020   MCV 93.9 01/17/2020   PLT 146.0 (L) 01/17/2020   Lab Results  Component Value Date   CREATININE 0.78 01/17/2020   BUN 18 01/17/2020   NA 135 01/17/2020   K 4.7 01/17/2020   CL 99 01/17/2020   CO2 27 01/17/2020   Lab Results  Component Value Date   ALT 10 01/17/2020   AST 17 01/17/2020   ALKPHOS 54 01/17/2020   BILITOT 0.6 01/17/2020   Lab Results  Component Value Date   CHOL 132 01/17/2020   HDL 51.60 01/17/2020   LDLCALC 66 01/17/2020   LDLDIRECT 104.4 02/27/2011   TRIG 73.0 01/17/2020   CHOLHDL 3 01/17/2020    Assessment & Plan    1.  Coronary artery disease: Status post CABG times 09/2014.  She has continued to do well without any chest pain or dyspnea.  She is not routinely exercising and ambulates with a walker but says that she is very active around her house.  She remains on aspirin, statin, beta-blocker therapy.  2.  Essential hypertension: Blood pressure elevated today at 180/72.  I did repeat this after talking with her from some time and got 182/74.  Her daughter was previously checking her blood pressure regularly and got variable numbers and ultimately stop checking because it seemed as though it was making the patient somewhat anxious and raising her blood pressure.  I note that her last primary care visit earlier this month the blood pressure was 130/84.  She remains on carvedilol therapy (heart rate 64).  We did discuss potentially adding an additional agent such as losartan but agreed that she would have her blood pressure checked daily for the next week or 2 and contact us either via phone or MyChart with  trends, at which point we can make a more educated decision.  I suspect she will require the addition of losartan 25 mg daily.  3.  Hyperlipidemia: LDL of 66 earlier this week with normal LFTs.  She remains on high potency statin therapy.  4.  Chronic heart failure with preserved  EF: Normal LV function by echo last year.  Euvolemic on examination today.  Addressing blood pressure as outlined above.  Heart rate stable.  She uses torsemide a few times a month.   5.  Mod AI:  Stable by echo 02/2019.  Asymptomatic.  F/u echo next year.  6.  Disposition: Patient to contact us with blood pressure trends in approximately 2 weeks.  Otherwise plan to follow-up in 6 months.  Murray Hodgkins, NP 01/26/2020, 1:19 PM

## 2020-02-02 DIAGNOSIS — H353211 Exudative age-related macular degeneration, right eye, with active choroidal neovascularization: Secondary | ICD-10-CM | POA: Diagnosis not present

## 2020-02-10 ENCOUNTER — Telehealth: Payer: Self-pay | Admitting: Cardiovascular Disease

## 2020-02-10 NOTE — Telephone Encounter (Signed)
DPR on file. Patients daughter [name] made aware of Ignacia Bayley, NP response and recommendation. Cathy agreeable with the the POC and verbalized understanding.

## 2020-02-10 NOTE — Telephone Encounter (Signed)
Patient daughter calling back with BP readings: 8/26- 168/68 HR 62 8/27-140/60 HR 64 9/3-140/72 HR 64 9/8-140/70 HR 68 All above readings are 1 hour after medication.   8/29-150/60 HR 56 8/31-140/70 HR 62 9/1-110/70 HR 60 9/6-128/78 HR 66 Patient daughter still gets anxious when BP is taken.

## 2020-02-10 NOTE — Telephone Encounter (Signed)
BPs variable but it looks like that since 9/1, she's been trending lower - 110-140.  I recommend continued following of BP at home for now, maybe a few x/wk, rather than adding an additional agent.

## 2020-02-10 NOTE — Telephone Encounter (Signed)
Ignacia Bayley, NP requested the patient call with BP readings after her recent o/v. Msg fwd to CB

## 2020-02-11 DIAGNOSIS — H6123 Impacted cerumen, bilateral: Secondary | ICD-10-CM | POA: Diagnosis not present

## 2020-02-11 DIAGNOSIS — H903 Sensorineural hearing loss, bilateral: Secondary | ICD-10-CM | POA: Diagnosis not present

## 2020-03-06 ENCOUNTER — Other Ambulatory Visit: Payer: Self-pay

## 2020-03-07 ENCOUNTER — Ambulatory Visit (INDEPENDENT_AMBULATORY_CARE_PROVIDER_SITE_OTHER): Payer: Medicare HMO | Admitting: Nurse Practitioner

## 2020-03-07 ENCOUNTER — Other Ambulatory Visit: Payer: Self-pay

## 2020-03-07 ENCOUNTER — Encounter: Payer: Self-pay | Admitting: Nurse Practitioner

## 2020-03-07 VITALS — BP 140/72 | HR 86 | Temp 98.1°F | Resp 16 | Wt 169.4 lb

## 2020-03-07 DIAGNOSIS — R2681 Unsteadiness on feet: Secondary | ICD-10-CM

## 2020-03-07 DIAGNOSIS — R531 Weakness: Secondary | ICD-10-CM | POA: Diagnosis not present

## 2020-03-07 DIAGNOSIS — R2689 Other abnormalities of gait and mobility: Secondary | ICD-10-CM | POA: Diagnosis not present

## 2020-03-07 NOTE — Patient Instructions (Addendum)
I have placed the referral in to home health and asked for in home Physical therapy.    Weakness Weakness is a lack of strength. You may feel weak all over your body (generalized), or you may feel weak in one specific part of your body (focal). Common causes of weakness include:  Infection and immune system disorders.  Physical exhaustion.  Internal bleeding or other blood loss that results in a lack of red blood cells (anemia).  Dehydration.  An imbalance in mineral (electrolyte) levels, such as potassium.  Heart disease, circulation problems, or stroke. Other causes include:  Some medicines or cancer treatment.  Stress, anxiety, or depression.  Nervous system disorders.  Thyroid disorders.  Loss of muscle strength because of age or inactivity.  Poor sleep quality or sleep disorders. The cause of your weakness may not be known. Some causes of weakness can be serious, so it is important to see your health care provider. Follow these instructions at home: Activity  Rest as needed.  Try to get enough sleep. Most adults need 7-8 hours of quality sleep each night. Talk to your health care provider about how much sleep you need each night.  Do exercises, such as arm curls and leg raises, for 30 minutes at least 2 days a week or as told by your health care provider. This helps build muscle strength.  Consider working with a physical therapist or trainer who can develop an exercise plan to help you gain muscle strength. General instructions   Take over-the-counter and prescription medicines only as told by your health care provider.  Eat a healthy, well-balanced diet. This includes: ? Proteins to build muscles, such as lean meats and fish. ? Fresh fruits and vegetables. ? Carbohydrates to boost energy, such as whole grains.  Drink enough fluid to keep your urine pale yellow.  Keep all follow-up visits as told by your health care provider. This is important. Contact a  health care provider if your weakness:  Does not improve or gets worse.  Affects your ability to think clearly.  Affects your ability to do your normal daily activities. Get help right away if you:  Develop sudden weakness, especially on one side of your face or body.  Have chest pain.  Have trouble breathing or shortness of breath.  Have problems with your vision.  Have trouble talking or swallowing.  Have trouble standing or walking.  Are light-headed or lose consciousness. Summary  Weakness is a lack of strength. You may feel weak all over your body or just in one specific part of your body.  Weakness can be caused by a variety of things. In some cases, the cause may be unknown.  Rest as needed, and try to get enough sleep. Most adults need 7-8 hours of quality sleep each night.  Eat a healthy, well-balanced diet. This information is not intended to replace advice given to you by your health care provider. Make sure you discuss any questions you have with your health care provider. Document Revised: 12/24/2017 Document Reviewed: 12/24/2017 Elsevier Patient Education  [place].

## 2020-03-07 NOTE — Progress Notes (Signed)
Established Patient Office Visit  Subjective:  Patient ID: Jenna Marshall, female    DOB: Aug 06, 1925  Age: 86 y.o. MRN: [id number]  CC:  Chief Complaint  Patient presents with  . Other    Discuss home health    HPI Jenna Marshall is a 86 year old patient of Dr. Derrel Nip who comes in today with her daughter [name] to complete home health referral for in-home physical therapy.  The patient remains living at home independently.  She does not drive.  Her family visits her daily and assists with meal preparation, shopping and housekeeping. She sat on the side of the bed and slid off the bed onto carpeted floor last week.  She did not get hurt.  Mrs. Bolen been having problems with her balance, and feels like her legs and her ankles are weak.  Her ankles wobble when she walks.  She has to use her arms more to get up out of the chair.  She is walking through the house now using her rolling walker.  Before she was using the walker, she was holding onto furniture and walls to keep from falling.  The patient and family believe she needs strength training, and balance training and it would be a hardship for her to get this at the hospital.  They are requesting in-home physical therapy.   Past Medical History:  Diagnosis Date  . Aortic insufficiency    a. 03/2017 Echo: Mod AI; b. 02/2019 Echo: Mod AI.  Marland Kitchen Arthritis   . CAD (coronary artery disease)    a. Lexiscan 10/13/14: mid anterior to apical & inf wall ischemia w/ WMA, mild to mod dep EF; b. Cath 10/2014: LM 50ost, LM 99, ost LAD 95, 40p/m, RCA 30p, 58m; b. 10/2014 CABG x 4 (LIMA->LAD, VG->RCA, VG->RI->OM).  . Depression   . Diastolic dysfunction    a. 10/2014 Echo: EF 55-60%, no RWMA, GR1DD; b. 03/2017 Echo: EF 60-65%, mild LVH, Gr2 DD; c. 02/2019 Echo: EF 55-60%, sev LVH. Diast dysfxn. No rwma. RVSP 41.48mmHg. Mildly dil LA. Mild to mod MR. Mod TR. Mod AI.  Marland Kitchen GERD (gastroesophageal reflux disease)   . Hyperlipidemia   . Hypertension   .  Hypothyroidism   . Macular degeneration   . pernicious anemia   . Varicose veins   . Wears dentures    partial upper  . Wears hearing aid    right    Past Surgical History:  Procedure Laterality Date  . ANTERIOR VITRECTOMY Right 07/16/2016   Procedure: ANTERIOR VITRECTOMY;  Surgeon: Eulogio Bear, MD;  Location: [place];  Service: Ophthalmology;  Laterality: Right;  . CARDIAC CATHETERIZATION Left 10/14/2014   Procedure: Left Heart Cath and Coronary Angiography;  Surgeon: Wellington Hampshire, MD;  Location: [place] [facility];  Service: Cardiovascular;  Laterality: Left;  . CATARACT EXTRACTION W/PHACO Left 04/30/2016   Procedure: CATARACT EXTRACTION PHACO AND INTRAOCULAR LENS PLACEMENT (IOC);  Surgeon: Eulogio Bear, MD;  Location: [place];  Service: Ophthalmology;  Laterality: Left;  LEFT  . CATARACT EXTRACTION W/PHACO Right 07/16/2016   Procedure: CATARACT EXTRACTION PHACO AND INTRAOCULAR LENS PLACEMENT (Between)  Right;  Surgeon: Eulogio Bear, MD;  Location: [place];  Service: Ophthalmology;  Laterality: Right;  . CORONARY ARTERY BYPASS GRAFT N/A 10/14/2014   Procedure: CORONARY ARTERY BYPASS GRAFTING (CABG), ON PUMP, TIMES FOUR, USING LEFT INTERNAL MAMMARY ARTERY, RIGHT GREATER SAPHENOUS VEIN HARVESTED ENDOSCOPICALLY;  Surgeon: Gaye Pollack, MD;  Location: [place];  Service: Open Heart Surgery;  Laterality: N/A;  . CORONARY ARTERY BYPASS GRAFT  10/2014   LIMA-->LAD, SVG-->RCA, sequential SVG-->Ramus and OM  . HEMORRHOID SURGERY    . HIP ARTHROPLASTY Left 01/14/2015   Procedure: ARTHROPLASTY BIPOLAR HIP (HEMIARTHROPLASTY);  Surgeon: Dereck Leep, MD;  Location: [facility];  Service: Orthopedics;  Laterality: Left;  Marland Kitchen VARICOSE VEIN SURGERY      Family History  Problem Relation Age of Onset  . Coronary artery disease Mother   . Diabetes Mother   . Heart disease Father   . Coronary artery disease Brother   . Breast cancer Neg Hx     Social  History   Socioeconomic History  . Marital status: Widowed    Spouse name: Not on file  . Number of children: Not on file  . Years of education: Not on file  . Highest education level: Not on file  Occupational History  . Not on file  Tobacco Use  . Smoking status: Never Smoker  . Smokeless tobacco: Never Used  Substance and Sexual Activity  . Alcohol use: No  . Drug use: No  . Sexual activity: Never  Other Topics Concern  . Not on file  Social History Narrative   ** Merged History Encounter **       Social Determinants of Health   Financial Resource Strain:   . Difficulty of Paying Living Expenses: Not on file  Food Insecurity:   . Worried About Charity fundraiser in the Last Year: Not on file  . Ran Out of Food in the Last Year: Not on file  Transportation Needs:   . Lack of Transportation (Medical): Not on file  . Lack of Transportation (Non-Medical): Not on file  Physical Activity:   . Days of Exercise per Week: Not on file  . Minutes of Exercise per Session: Not on file  Stress:   . Feeling of Stress : Not on file  Social Connections: Moderately Integrated  . Frequency of Communication with Friends and Family: More than three times a week  . Frequency of Social Gatherings with Friends and Family: More than three times a week  . Attends Religious Services: More than 4 times per year  . Active Member of Clubs or Organizations: Yes  . Attends Archivist Meetings: More than 4 times per year  . Marital Status: Widowed  Intimate Partner Violence:   . Fear of Current or Ex-Partner: Not on file  . Emotionally Abused: Not on file  . Physically Abused: Not on file  . Sexually Abused: Not on file    Outpatient Medications Prior to Visit  Medication Sig Dispense Refill  . acetaminophen (TYLENOL) 325 MG tablet Take 2 tablets (650 mg total) by mouth every 6 (six) hours as needed for mild pain.    Marland Kitchen aspirin 81 MG chewable tablet Chew 1 tablet (81 mg total) by  mouth daily. 30 tablet 0  . atorvastatin (LIPITOR) 40 MG tablet Take 1 tablet (40 mg total) by mouth daily. 90 tablet 3  . Calcium Carbonate-Vitamin D (CALCIUM + D) 600-200 MG-UNIT TABS Take 1 tablet by mouth 2 (two) times daily.     . carvedilol (COREG) 3.125 MG tablet TAKE ONE TABLET BY MOUTH TWICE DAILY WITH A MEAL 180 tablet 1  . Cholecalciferol (D3 DOTS) 2000 UNITS TBDP Take 2,000 Units by mouth.    . levothyroxine (SYNTHROID) 25 MCG tablet TAKE ONE TABLET BY MOUTH EVERY MORNING WITH BREAKFAST 90 tablet 0  .  Multiple Vitamin (MULTIVITAMIN WITH MINERALS) TABS tablet Take 1 tablet by mouth daily.    . Multiple Vitamins-Minerals (PRESERVISION AREDS 2) CAPS Take 1 capsule by mouth 2 (two) times daily.     Marland Kitchen MYRBETRIQ 25 MG TB24 tablet TAKE ONE TABLET BY MOUTH ONCE DAILY 30 tablet 5  . pantoprazole (PROTONIX) 40 MG tablet TAKE ONE TABLET BY MOUTH ONCE DAILY 90 tablet 1  . senna-docusate (SENOKOT-S) 8.6-50 MG per tablet Take 1 tablet by mouth 2 (two) times daily. 30 tablet 0  . torsemide (DEMADEX) 10 MG tablet Take 10 mg (1 tablet) by mouth daily for 5 days, then take 1 tablet daily as needed for shortness of breath or weight gain 3 lbs or greater overnight. 30 tablet 5   No facility-administered medications prior to visit.    Allergies  Allergen Reactions  . Ambien [Zolpidem] Other (See Comments)    Reaction:  Hallucinations  . Codeine Nausea And Vomiting  . Lasix [Furosemide] Itching and Rash    Review of Systems  Constitutional: Negative for chills and fever.  HENT: Negative.   Respiratory: Negative.   Cardiovascular: Negative.   Gastrointestinal: Positive for constipation.       Needs Senna plus BID for years and occ glycerine supp  Musculoskeletal: Negative for back pain, joint swelling, myalgias and neck pain.       Positive arthritis in fingers and discomfort in knees and back - Uses Walker. See HPI  Neurological: Negative for dizziness, syncope and headaches.        Objective:    Physical Exam Vitals reviewed.  Cardiovascular:     Rate and Rhythm: Normal rate and regular rhythm.     Pulses: Normal pulses.  Pulmonary:     Effort: Pulmonary effort is normal.  Musculoskeletal:     Cervical back: Normal range of motion.     Comments: Chair to standing movement is delayed. She is able to stand independently and walks across the room using walker slowly. She does appear unsteady on her feet.   Skin:    General: Skin is warm and dry.  Neurological:     General: No focal deficit present.     Mental Status: She is alert and oriented to person, place, and time.     Gait: Gait normal.  Psychiatric:        Mood and Affect: Mood normal.        Behavior: Behavior normal.     BP 140/72   Pulse 86   Temp 98.1 F (36.7 C) (Oral)   Resp 16   Wt 169 lb 6.4 oz (76.8 kg)   SpO2 97%   BMI 28.63 kg/m  Wt Readings from Last 3 Encounters:  03/07/20 169 lb 6.4 oz (76.8 kg)  01/26/20 171 lb 3.2 oz (77.7 kg)  01/17/20 173 lb 3.2 oz (78.6 kg)   Pulse Readings from Last 3 Encounters:  03/07/20 86  01/26/20 64  01/17/20 (!) 57    BP Readings from Last 3 Encounters:  03/07/20 140/72  01/26/20 (!) 180/72  01/17/20 138/84    Lab Results  Component Value Date   CHOL 132 01/17/2020   HDL 51.60 01/17/2020   LDLCALC 66 01/17/2020   LDLDIRECT 104.4 02/27/2011   TRIG 73.0 01/17/2020   CHOLHDL 3 01/17/2020      There are no preventive care reminders to display for this patient.  There are no preventive care reminders to display for this patient.  Lab Results  Component Value Date  TSH 3.84 01/17/2020   Lab Results  Component Value Date   WBC 6.2 01/17/2020   HGB 12.0 01/17/2020   HCT 35.1 (L) 01/17/2020   MCV 93.9 01/17/2020   PLT 146.0 (L) 01/17/2020   Lab Results  Component Value Date   NA 135 01/17/2020   K 4.7 01/17/2020   CO2 27 01/17/2020   GLUCOSE 98 01/17/2020   BUN 18 01/17/2020   CREATININE 0.78 01/17/2020   BILITOT 0.6  01/17/2020   ALKPHOS 54 01/17/2020   AST 17 01/17/2020   ALT 10 01/17/2020   PROT 6.4 01/17/2020   ALBUMIN 4.0 01/17/2020   CALCIUM 9.1 01/17/2020   ANIONGAP 7 02/26/2019   GFR 69.06 01/17/2020   Lab Results  Component Value Date   CHOL 132 01/17/2020   Lab Results  Component Value Date   HDL 51.60 01/17/2020   Lab Results  Component Value Date   LDLCALC 66 01/17/2020   Lab Results  Component Value Date   TRIG 73.0 01/17/2020   Lab Results  Component Value Date   CHOLHDL 3 01/17/2020   No results found for: HGBA1C    Assessment & Plan:   Problem List Items Addressed This Visit      Other   General weakness - Primary   Relevant Orders   Ambulatory referral to [place] gait when walking   Relevant Orders   Ambulatory referral to [place] problem   Relevant Orders   Ambulatory referral to [place]     This 86 year old patient is quite alert, living independently with family support.  I think she would benefit greatly from in-home physical therapy for strength and balance training.  She is ambulating with a walker now very slowly, and she does appear wobbly.  I have placed the referral in to home health and asked for in home Physical therapy.  She is with her daughter [name] today.  Kathy's cell number is [phone].  Her home number is [phone].  No orders of the defined types were placed in this encounter.   Follow-up: No follow-ups on file.  This visit occurred during the SARS-CoV-2 public health emergency.  Safety protocols were in place, including screening questions prior to the visit, additional usage of staff PPE, and extensive cleaning of exam room while observing appropriate contact time as indicated for disinfecting solutions.    Denice Paradise, NP

## 2020-03-08 DIAGNOSIS — M25562 Pain in left knee: Secondary | ICD-10-CM | POA: Diagnosis not present

## 2020-03-08 DIAGNOSIS — G8929 Other chronic pain: Secondary | ICD-10-CM | POA: Diagnosis not present

## 2020-03-08 DIAGNOSIS — M25552 Pain in left hip: Secondary | ICD-10-CM | POA: Diagnosis not present

## 2020-03-08 DIAGNOSIS — M25561 Pain in right knee: Secondary | ICD-10-CM | POA: Diagnosis not present

## 2020-03-08 DIAGNOSIS — I509 Heart failure, unspecified: Secondary | ICD-10-CM | POA: Diagnosis not present

## 2020-03-08 DIAGNOSIS — M1990 Unspecified osteoarthritis, unspecified site: Secondary | ICD-10-CM | POA: Diagnosis not present

## 2020-03-08 DIAGNOSIS — M545 Low back pain, unspecified: Secondary | ICD-10-CM | POA: Diagnosis not present

## 2020-03-08 DIAGNOSIS — M810 Age-related osteoporosis without current pathological fracture: Secondary | ICD-10-CM | POA: Diagnosis not present

## 2020-03-08 DIAGNOSIS — I110 Hypertensive heart disease with heart failure: Secondary | ICD-10-CM | POA: Diagnosis not present

## 2020-03-13 DIAGNOSIS — M545 Low back pain, unspecified: Secondary | ICD-10-CM | POA: Diagnosis not present

## 2020-03-13 DIAGNOSIS — I509 Heart failure, unspecified: Secondary | ICD-10-CM | POA: Diagnosis not present

## 2020-03-13 DIAGNOSIS — M25552 Pain in left hip: Secondary | ICD-10-CM | POA: Diagnosis not present

## 2020-03-13 DIAGNOSIS — M1990 Unspecified osteoarthritis, unspecified site: Secondary | ICD-10-CM | POA: Diagnosis not present

## 2020-03-13 DIAGNOSIS — G8929 Other chronic pain: Secondary | ICD-10-CM | POA: Diagnosis not present

## 2020-03-13 DIAGNOSIS — M25562 Pain in left knee: Secondary | ICD-10-CM | POA: Diagnosis not present

## 2020-03-13 DIAGNOSIS — M25561 Pain in right knee: Secondary | ICD-10-CM | POA: Diagnosis not present

## 2020-03-13 DIAGNOSIS — I110 Hypertensive heart disease with heart failure: Secondary | ICD-10-CM | POA: Diagnosis not present

## 2020-03-13 DIAGNOSIS — M810 Age-related osteoporosis without current pathological fracture: Secondary | ICD-10-CM | POA: Diagnosis not present

## 2020-03-14 ENCOUNTER — Other Ambulatory Visit: Payer: Self-pay | Admitting: Internal Medicine

## 2020-03-15 ENCOUNTER — Telehealth: Payer: Self-pay | Admitting: Internal Medicine

## 2020-03-16 DIAGNOSIS — M25552 Pain in left hip: Secondary | ICD-10-CM | POA: Diagnosis not present

## 2020-03-16 DIAGNOSIS — M25562 Pain in left knee: Secondary | ICD-10-CM | POA: Diagnosis not present

## 2020-03-16 DIAGNOSIS — M1990 Unspecified osteoarthritis, unspecified site: Secondary | ICD-10-CM | POA: Diagnosis not present

## 2020-03-16 DIAGNOSIS — M25561 Pain in right knee: Secondary | ICD-10-CM | POA: Diagnosis not present

## 2020-03-16 DIAGNOSIS — G8929 Other chronic pain: Secondary | ICD-10-CM | POA: Diagnosis not present

## 2020-03-16 DIAGNOSIS — I110 Hypertensive heart disease with heart failure: Secondary | ICD-10-CM | POA: Diagnosis not present

## 2020-03-16 DIAGNOSIS — M810 Age-related osteoporosis without current pathological fracture: Secondary | ICD-10-CM | POA: Diagnosis not present

## 2020-03-16 DIAGNOSIS — I509 Heart failure, unspecified: Secondary | ICD-10-CM | POA: Diagnosis not present

## 2020-03-16 DIAGNOSIS — M545 Low back pain, unspecified: Secondary | ICD-10-CM | POA: Diagnosis not present

## 2020-03-20 DIAGNOSIS — G8929 Other chronic pain: Secondary | ICD-10-CM | POA: Diagnosis not present

## 2020-03-20 DIAGNOSIS — M25552 Pain in left hip: Secondary | ICD-10-CM | POA: Diagnosis not present

## 2020-03-20 DIAGNOSIS — M25562 Pain in left knee: Secondary | ICD-10-CM | POA: Diagnosis not present

## 2020-03-20 DIAGNOSIS — M25561 Pain in right knee: Secondary | ICD-10-CM | POA: Diagnosis not present

## 2020-03-20 DIAGNOSIS — M545 Low back pain, unspecified: Secondary | ICD-10-CM | POA: Diagnosis not present

## 2020-03-20 DIAGNOSIS — M1990 Unspecified osteoarthritis, unspecified site: Secondary | ICD-10-CM | POA: Diagnosis not present

## 2020-03-20 DIAGNOSIS — I110 Hypertensive heart disease with heart failure: Secondary | ICD-10-CM | POA: Diagnosis not present

## 2020-03-20 DIAGNOSIS — M810 Age-related osteoporosis without current pathological fracture: Secondary | ICD-10-CM | POA: Diagnosis not present

## 2020-03-20 DIAGNOSIS — I509 Heart failure, unspecified: Secondary | ICD-10-CM | POA: Diagnosis not present

## 2020-03-23 DIAGNOSIS — M545 Low back pain, unspecified: Secondary | ICD-10-CM | POA: Diagnosis not present

## 2020-03-23 DIAGNOSIS — I509 Heart failure, unspecified: Secondary | ICD-10-CM | POA: Diagnosis not present

## 2020-03-23 DIAGNOSIS — M25552 Pain in left hip: Secondary | ICD-10-CM | POA: Diagnosis not present

## 2020-03-23 DIAGNOSIS — M25561 Pain in right knee: Secondary | ICD-10-CM | POA: Diagnosis not present

## 2020-03-23 DIAGNOSIS — I110 Hypertensive heart disease with heart failure: Secondary | ICD-10-CM | POA: Diagnosis not present

## 2020-03-23 DIAGNOSIS — M25562 Pain in left knee: Secondary | ICD-10-CM | POA: Diagnosis not present

## 2020-03-23 DIAGNOSIS — M810 Age-related osteoporosis without current pathological fracture: Secondary | ICD-10-CM | POA: Diagnosis not present

## 2020-03-23 DIAGNOSIS — M1990 Unspecified osteoarthritis, unspecified site: Secondary | ICD-10-CM | POA: Diagnosis not present

## 2020-03-23 DIAGNOSIS — G8929 Other chronic pain: Secondary | ICD-10-CM | POA: Diagnosis not present

## 2020-03-27 DIAGNOSIS — M25561 Pain in right knee: Secondary | ICD-10-CM | POA: Diagnosis not present

## 2020-03-27 DIAGNOSIS — I110 Hypertensive heart disease with heart failure: Secondary | ICD-10-CM | POA: Diagnosis not present

## 2020-03-27 DIAGNOSIS — M545 Low back pain, unspecified: Secondary | ICD-10-CM | POA: Diagnosis not present

## 2020-03-27 DIAGNOSIS — M810 Age-related osteoporosis without current pathological fracture: Secondary | ICD-10-CM | POA: Diagnosis not present

## 2020-03-27 DIAGNOSIS — G8929 Other chronic pain: Secondary | ICD-10-CM | POA: Diagnosis not present

## 2020-03-27 DIAGNOSIS — M1990 Unspecified osteoarthritis, unspecified site: Secondary | ICD-10-CM | POA: Diagnosis not present

## 2020-03-27 DIAGNOSIS — M25562 Pain in left knee: Secondary | ICD-10-CM | POA: Diagnosis not present

## 2020-03-27 DIAGNOSIS — M25552 Pain in left hip: Secondary | ICD-10-CM | POA: Diagnosis not present

## 2020-03-27 DIAGNOSIS — I509 Heart failure, unspecified: Secondary | ICD-10-CM | POA: Diagnosis not present

## 2020-03-30 DIAGNOSIS — M25562 Pain in left knee: Secondary | ICD-10-CM | POA: Diagnosis not present

## 2020-03-30 DIAGNOSIS — I509 Heart failure, unspecified: Secondary | ICD-10-CM | POA: Diagnosis not present

## 2020-03-30 DIAGNOSIS — M1990 Unspecified osteoarthritis, unspecified site: Secondary | ICD-10-CM | POA: Diagnosis not present

## 2020-03-30 DIAGNOSIS — G8929 Other chronic pain: Secondary | ICD-10-CM | POA: Diagnosis not present

## 2020-03-30 DIAGNOSIS — I110 Hypertensive heart disease with heart failure: Secondary | ICD-10-CM | POA: Diagnosis not present

## 2020-03-30 DIAGNOSIS — M545 Low back pain, unspecified: Secondary | ICD-10-CM | POA: Diagnosis not present

## 2020-03-30 DIAGNOSIS — M25561 Pain in right knee: Secondary | ICD-10-CM | POA: Diagnosis not present

## 2020-03-30 DIAGNOSIS — M810 Age-related osteoporosis without current pathological fracture: Secondary | ICD-10-CM | POA: Diagnosis not present

## 2020-03-30 DIAGNOSIS — M25552 Pain in left hip: Secondary | ICD-10-CM | POA: Diagnosis not present

## 2020-04-05 DIAGNOSIS — H353211 Exudative age-related macular degeneration, right eye, with active choroidal neovascularization: Secondary | ICD-10-CM | POA: Diagnosis not present

## 2020-04-06 DIAGNOSIS — M25561 Pain in right knee: Secondary | ICD-10-CM | POA: Diagnosis not present

## 2020-04-06 DIAGNOSIS — M810 Age-related osteoporosis without current pathological fracture: Secondary | ICD-10-CM | POA: Diagnosis not present

## 2020-04-06 DIAGNOSIS — M545 Low back pain, unspecified: Secondary | ICD-10-CM | POA: Diagnosis not present

## 2020-04-06 DIAGNOSIS — I110 Hypertensive heart disease with heart failure: Secondary | ICD-10-CM | POA: Diagnosis not present

## 2020-04-06 DIAGNOSIS — M25552 Pain in left hip: Secondary | ICD-10-CM | POA: Diagnosis not present

## 2020-04-06 DIAGNOSIS — G8929 Other chronic pain: Secondary | ICD-10-CM | POA: Diagnosis not present

## 2020-04-06 DIAGNOSIS — M25562 Pain in left knee: Secondary | ICD-10-CM | POA: Diagnosis not present

## 2020-04-06 DIAGNOSIS — M1990 Unspecified osteoarthritis, unspecified site: Secondary | ICD-10-CM | POA: Diagnosis not present

## 2020-04-06 DIAGNOSIS — I509 Heart failure, unspecified: Secondary | ICD-10-CM | POA: Diagnosis not present

## 2020-04-07 ENCOUNTER — Other Ambulatory Visit: Payer: Self-pay | Admitting: Internal Medicine

## 2020-04-10 DIAGNOSIS — M810 Age-related osteoporosis without current pathological fracture: Secondary | ICD-10-CM | POA: Diagnosis not present

## 2020-04-10 DIAGNOSIS — M1990 Unspecified osteoarthritis, unspecified site: Secondary | ICD-10-CM | POA: Diagnosis not present

## 2020-04-10 DIAGNOSIS — G8929 Other chronic pain: Secondary | ICD-10-CM | POA: Diagnosis not present

## 2020-04-10 DIAGNOSIS — M25552 Pain in left hip: Secondary | ICD-10-CM | POA: Diagnosis not present

## 2020-04-10 DIAGNOSIS — I110 Hypertensive heart disease with heart failure: Secondary | ICD-10-CM | POA: Diagnosis not present

## 2020-04-10 DIAGNOSIS — I509 Heart failure, unspecified: Secondary | ICD-10-CM | POA: Diagnosis not present

## 2020-04-10 DIAGNOSIS — M25561 Pain in right knee: Secondary | ICD-10-CM | POA: Diagnosis not present

## 2020-04-10 DIAGNOSIS — M545 Low back pain, unspecified: Secondary | ICD-10-CM | POA: Diagnosis not present

## 2020-04-10 DIAGNOSIS — M25562 Pain in left knee: Secondary | ICD-10-CM | POA: Diagnosis not present

## 2020-04-19 DIAGNOSIS — M545 Low back pain, unspecified: Secondary | ICD-10-CM | POA: Diagnosis not present

## 2020-04-19 DIAGNOSIS — M810 Age-related osteoporosis without current pathological fracture: Secondary | ICD-10-CM | POA: Diagnosis not present

## 2020-04-19 DIAGNOSIS — M25552 Pain in left hip: Secondary | ICD-10-CM | POA: Diagnosis not present

## 2020-04-19 DIAGNOSIS — I110 Hypertensive heart disease with heart failure: Secondary | ICD-10-CM | POA: Diagnosis not present

## 2020-04-19 DIAGNOSIS — I509 Heart failure, unspecified: Secondary | ICD-10-CM | POA: Diagnosis not present

## 2020-04-19 DIAGNOSIS — M25561 Pain in right knee: Secondary | ICD-10-CM | POA: Diagnosis not present

## 2020-04-19 DIAGNOSIS — M1990 Unspecified osteoarthritis, unspecified site: Secondary | ICD-10-CM | POA: Diagnosis not present

## 2020-04-19 DIAGNOSIS — M25562 Pain in left knee: Secondary | ICD-10-CM | POA: Diagnosis not present

## 2020-04-19 DIAGNOSIS — G8929 Other chronic pain: Secondary | ICD-10-CM | POA: Diagnosis not present

## 2020-05-22 ENCOUNTER — Other Ambulatory Visit: Payer: Self-pay | Admitting: Internal Medicine

## 2020-05-29 ENCOUNTER — Other Ambulatory Visit: Payer: Self-pay | Admitting: Internal Medicine

## 2020-05-31 DIAGNOSIS — H353211 Exudative age-related macular degeneration, right eye, with active choroidal neovascularization: Secondary | ICD-10-CM | POA: Diagnosis not present

## 2020-06-08 ENCOUNTER — Telehealth: Payer: Medicare HMO | Admitting: Internal Medicine

## 2020-06-26 ENCOUNTER — Telehealth: Payer: Medicare HMO | Admitting: Internal Medicine

## 2020-06-27 ENCOUNTER — Encounter: Payer: Self-pay | Admitting: *Deleted

## 2020-07-04 ENCOUNTER — Telehealth: Payer: Self-pay | Admitting: Internal Medicine

## 2020-07-04 ENCOUNTER — Encounter: Payer: Self-pay | Admitting: Internal Medicine

## 2020-07-04 ENCOUNTER — Telehealth (INDEPENDENT_AMBULATORY_CARE_PROVIDER_SITE_OTHER): Payer: Medicare HMO | Admitting: Internal Medicine

## 2020-07-04 DIAGNOSIS — R2681 Unsteadiness on feet: Secondary | ICD-10-CM | POA: Diagnosis not present

## 2020-07-04 DIAGNOSIS — R29898 Other symptoms and signs involving the musculoskeletal system: Secondary | ICD-10-CM | POA: Diagnosis not present

## 2020-07-04 NOTE — Assessment & Plan Note (Signed)
Needs PT as she was unable to climb up bed when she slid off recently

## 2020-07-04 NOTE — Progress Notes (Signed)
Telephone Visit   This visit type was conducted due to national recommendations for restrictions regarding the COVID-19 pandemic (e.g. social distancing).  This format is felt to be most appropriate for this patient at this time.  All issues noted in this document were discussed and addressed.  No physical exam was performed (except for noted visual exam findings with Video Visits).   I connected with@ on 07/04/20 at  2:00 PM EST by  Telephone and  verified that I am speaking with the correct person using two identifiers. Location patient: home Location provider: work or home office Persons participating in the virtual visit: patient, provider  I discussed the limitations, risks, security and privacy concerns of performing an evaluation and management service by telephone and the availability of in person appointments. I also discussed with the patient that there may be a patient responsible charge related to this service. The patient expressed understanding and agreed to proceed.   Reason for visit: FOLLOW UP ON lower extremity weakness   HPI:  Ms Jenna Marshall is a 87 yr old female with chronic left hip pain , history of hip fracture,  Unsteady gait, and 4 vessel CAD who presents for follow up. She lives alone and has been using a walker because of bilateral lower extremity weakness. She recently finished several weeks of home PT but states that both ankles still feel weak.  Previously reported knee pain , which has resolved with home PT.  Using tylenol  Once daily  Daughter thinks  Her ambulation has stabilized,  Certainly not getting any worse  Since PT.   She is Doing her exercises on her own for the last several weeks since PT stopped. Recently had another incident where she slid off the bed,  Onto the floor ,  Was unable to get up unassisted.  Was able to call her daughter and son who came right over and helped her up.    Wears a Runner, broadcasting/film/video alert .  Wants to resume PT with [facility] in February and include  exercises for upper body strength.  Patient has a recliner without  a lift chair.  Has grab bars in bathroom,  A step in shower and a raised seat for the commode.    ROS: See pertinent positives and negatives per HPI.  Past Medical History:  Diagnosis Date   Aortic insufficiency    a. 03/2017 Echo: Mod AI; b. 02/2019 Echo: Mod AI.   Arthritis    CAD (coronary artery disease)    a. Lexiscan 10/13/14: mid anterior to apical & inf wall ischemia w/ WMA, mild to mod dep EF; b. Cath 10/2014: LM 50ost, LM 99, ost LAD 95, 40p/m, RCA 30p, 84m; b. 10/2014 CABG x 4 (LIMA->LAD, VG->RCA, VG->RI->OM).   Depression    Diastolic dysfunction    a. 10/2014 Echo: EF 55-60%, no RWMA, GR1DD; b. 03/2017 Echo: EF 60-65%, mild LVH, Gr2 DD; c. 02/2019 Echo: EF 55-60%, sev LVH. Diast dysfxn. No rwma. RVSP 41.68mmHg. Mildly dil LA. Mild to mod MR. Mod TR. Mod AI.   GERD (gastroesophageal reflux disease)    Hyperlipidemia    Hypertension    Hypothyroidism    Macular degeneration    pernicious anemia    Varicose veins    Wears dentures    partial upper   Wears hearing aid    right    Past Surgical History:  Procedure Laterality Date   ANTERIOR VITRECTOMY Right 07/16/2016   Procedure: ANTERIOR VITRECTOMY;  Surgeon: Eulogio Bear,  MD;  Location: [place];  Service: Ophthalmology;  Laterality: Right;   CARDIAC CATHETERIZATION Left 10/14/2014   Procedure: Left Heart Cath and Coronary Angiography;  Surgeon: Wellington Hampshire, MD;  Location: [place] [facility];  Service: Cardiovascular;  Laterality: Left;   CATARACT EXTRACTION W/PHACO Left 04/30/2016   Procedure: CATARACT EXTRACTION PHACO AND INTRAOCULAR LENS PLACEMENT (IOC);  Surgeon: Eulogio Bear, MD;  Location: [place];  Service: Ophthalmology;  Laterality: Left;  LEFT   CATARACT EXTRACTION W/PHACO Right 07/16/2016   Procedure: CATARACT EXTRACTION PHACO AND INTRAOCULAR LENS PLACEMENT ([place])  Right;  Surgeon: Eulogio Bear, MD;  Location: [place];  Service: Ophthalmology;  Laterality: Right;   CORONARY ARTERY BYPASS GRAFT N/A 10/14/2014   Procedure: CORONARY ARTERY BYPASS GRAFTING (CABG), ON PUMP, TIMES FOUR, USING LEFT INTERNAL MAMMARY ARTERY, RIGHT GREATER SAPHENOUS VEIN HARVESTED ENDOSCOPICALLY;  Surgeon: Gaye Pollack, MD;  Location: [place];  Service: Open Heart Surgery;  Laterality: N/A;   CORONARY ARTERY BYPASS GRAFT  10/2014   LIMA-->LAD, SVG-->RCA, sequential SVG-->Ramus and OM   HEMORRHOID SURGERY     HIP ARTHROPLASTY Left 01/14/2015   Procedure: ARTHROPLASTY BIPOLAR HIP (HEMIARTHROPLASTY);  Surgeon: Dereck Leep, MD;  Location: [facility];  Service: Orthopedics;  Laterality: Left;   VARICOSE VEIN SURGERY      Family History  Problem Relation Age of Onset   Coronary artery disease Mother    Diabetes Mother    Heart disease Father    Coronary artery disease Brother    Breast cancer Neg Hx     SOCIAL HX: lives inependently.  Daughter does the grocery shopping for her due to [place] pandemic.  reports that she has never smoked. She has never used smokeless tobacco. She reports that she does not drink alcohol and does not use drugs.   Current Outpatient Medications:    acetaminophen (TYLENOL) 325 MG tablet, Take 2 tablets (650 mg total) by mouth every 6 (six) hours as needed for mild pain., Disp: , Rfl:    aspirin 81 MG chewable tablet, Chew 1 tablet (81 mg total) by mouth daily., Disp: 30 tablet, Rfl: 0   atorvastatin (LIPITOR) 40 MG tablet, Take 1 tablet (40 mg total) by mouth daily., Disp: 90 tablet, Rfl: 3   Calcium Carbonate-Vitamin D 600-200 MG-UNIT TABS, Take 1 tablet by mouth 2 (two) times daily., Disp: , Rfl:    carvedilol (COREG) 3.125 MG tablet, TAKE ONE TABLET BY MOUTH TWICE DAILY WITH A MEAL, Disp: 180 tablet, Rfl: 1   Cholecalciferol 50 MCG (2000 UT) TBDP, Take 2,000 Units by mouth., Disp: , Rfl:    levothyroxine (SYNTHROID) 25 MCG tablet, TAKE ONE TABLET BY  MOUTH EVERY MORNING WITH BREAKFAST, Disp: 90 tablet, Rfl: 0   Multiple Vitamin (MULTIVITAMIN WITH MINERALS) TABS tablet, Take 1 tablet by mouth daily., Disp: , Rfl:    Multiple Vitamins-Minerals (PRESERVISION AREDS 2) CAPS, Take 1 capsule by mouth 2 (two) times daily. , Disp: , Rfl:    MYRBETRIQ 25 MG TB24 tablet, TAKE ONE TABLET BY MOUTH ONCE DAILY, Disp: 30 tablet, Rfl: 5   pantoprazole (PROTONIX) 40 MG tablet, TAKE ONE TABLET BY MOUTH ONCE DAILY, Disp: 90 tablet, Rfl: 1   senna-docusate (SENOKOT-S) 8.6-50 MG per tablet, Take 1 tablet by mouth 2 (two) times daily., Disp: 30 tablet, Rfl: 0   torsemide (DEMADEX) 10 MG tablet, Take 10 mg (1 tablet) by mouth daily for 5 days, then take 1 tablet daily as needed for  shortness of breath or weight gain 3 lbs or greater overnight., Disp: 30 tablet, Rfl: 5  EXAM:  General impression: alert, cooperative and articulate.  No signs of being in distress  Lungs: speech is fluent sentence length suggests that patient is not short of breath and not punctuated by cough, sneezing or sniffing. Marland Kitchen   Psych: affect normal.  speech is articulate and non pressured .  Denies suicidal thoughts    ASSESSMENT AND PLAN:  Discussed the following assessment and plan:  Upper extremity weakness  Unsteady gait when walking  Upper extremity weakness Needs PT as she was unable to climb up bed when she slid off recently   Unsteady gait when walking Resume home PT     I discussed the assessment and treatment plan with the patient. The patient was provided an opportunity to ask questions and all were answered. The patient agreed with the plan and demonstrated an understanding of the instructions.   The patient was advised to call back or seek an in-person evaluation if the symptoms worsen or if the condition fails to improve as anticipated.  I provided 22 minutes of non-face-to-face time during this encounter.   [name], MD

## 2020-07-04 NOTE — Assessment & Plan Note (Signed)
Resume home PT

## 2020-07-04 NOTE — Telephone Encounter (Signed)
Called and lm on vm to call office to make a non-fasting lab appt and a face to face with Dr.Tullo for Home Health Certification.

## 2020-07-05 NOTE — Telephone Encounter (Signed)
Patient returned office phone call. Patient was unable to come into the office on 07/07/20. She said 2/25 was fine, appt scheduled.

## 2020-07-26 DIAGNOSIS — H353211 Exudative age-related macular degeneration, right eye, with active choroidal neovascularization: Secondary | ICD-10-CM | POA: Diagnosis not present

## 2020-07-28 ENCOUNTER — Other Ambulatory Visit: Payer: Self-pay

## 2020-07-28 ENCOUNTER — Encounter: Payer: Self-pay | Admitting: Internal Medicine

## 2020-07-28 ENCOUNTER — Ambulatory Visit (INDEPENDENT_AMBULATORY_CARE_PROVIDER_SITE_OTHER): Payer: Medicare HMO | Admitting: Internal Medicine

## 2020-07-28 VITALS — BP 138/70 | HR 69 | Temp 97.9°F | Ht 64.49 in | Wt 168.4 lb

## 2020-07-28 DIAGNOSIS — D508 Other iron deficiency anemias: Secondary | ICD-10-CM | POA: Diagnosis not present

## 2020-07-28 DIAGNOSIS — R2681 Unsteadiness on feet: Secondary | ICD-10-CM

## 2020-07-28 DIAGNOSIS — R531 Weakness: Secondary | ICD-10-CM

## 2020-07-28 DIAGNOSIS — R29898 Other symptoms and signs involving the musculoskeletal system: Secondary | ICD-10-CM

## 2020-07-28 DIAGNOSIS — Z951 Presence of aortocoronary bypass graft: Secondary | ICD-10-CM

## 2020-07-28 DIAGNOSIS — E034 Atrophy of thyroid (acquired): Secondary | ICD-10-CM | POA: Diagnosis not present

## 2020-07-28 DIAGNOSIS — Z79899 Other long term (current) drug therapy: Secondary | ICD-10-CM | POA: Diagnosis not present

## 2020-07-28 NOTE — Addendum Note (Signed)
Addended by: Leeanne Rio on: 07/28/2020 02:36 PM   Modules accepted: Orders

## 2020-07-28 NOTE — Patient Instructions (Addendum)
I recommend trying melatonin for your insomnia.  It is not a sedative,  But must be taken on  a regular basis to help your internal clock.  Take every evening after dinner start with 3 mg dose   Max effective dose is 6 mg  You can also try reading Psalms w hen you wake up,  but do not use TV   t I am referring to our clinical pharmacist ,  Catie Darnelle Maffucci and our 'chronic care management " team . They and  Catie can help provide additional services to my patients who are on Medicare and dealing with  chronic diseases .  I do not expect this referral to cost you anything out of pocket, but I do think Catie will be able to help you get your medications, transportation,  And possibly meals on Wheess.  She will make contact with  You by phone in the next week   Home Health referral to [facility] also in progress for physical therapy

## 2020-07-28 NOTE — Progress Notes (Signed)
Subjective:  Patient ID: [name], female    DOB: [birth year]  Age: 87 y.o. MRN: [id number]  CC: The primary encounter diagnosis was Upper extremity weakness. Diagnoses of Unsteady gait when walking, S/P CABG x 4, General weakness, Other iron deficiency anemia, Long-term use of high-risk medication, and Hypothyroidism due to acquired atrophy of thyroid were also pertinent to this visit.  HPI Kyrah Schiro presents for follow up on progressive weakness,  Worsening balance and decreased upper extremity strength  This visit occurred during the SARS-CoV-2 public health emergency.  Safety protocols were in place, including screening questions prior to the visit, additional usage of staff PPE, and extensive cleaning of exam room while observing appropriate contact time as indicated for disinfecting solutions.   87 yr old independent female with history of CAD, hypothyroidism, chronic low back pain presents with  increasing need for supervision and assistance  at home due to weakness to maintain independent living.  She is accompanied  By her daughter [name]  Who notes that she has been increasingly unsteady on her feet.  She has refused to live with family members.  Refusing meal on wheels,   But she admits that preparing meals takes more energy that it used to because she cannot let go of her walker because of loss of balance and proximal muscle weakness.  She uses  a walker with a seat at home.   She is currently not getting home health .  Trying to do the exercises daily that were given during her last PT  Blames it on her back pain .   WAnts to resume home PT with [facility] .  Aldona Bar was the PT    Outpatient Medications Prior to Visit  Medication Sig Dispense Refill  . acetaminophen (TYLENOL) 325 MG tablet Take 2 tablets (650 mg total) by mouth every 6 (six) hours as needed for mild pain.    Marland Kitchen aspirin 81 MG chewable tablet Chew 1 tablet (81 mg total) by mouth daily. 30 tablet 0  .  atorvastatin (LIPITOR) 40 MG tablet Take 1 tablet (40 mg total) by mouth daily. 90 tablet 3  . Calcium Carbonate-Vitamin D 600-200 MG-UNIT TABS Take 1 tablet by mouth 2 (two) times daily.    . carvedilol (COREG) 3.125 MG tablet TAKE ONE TABLET BY MOUTH TWICE DAILY WITH A MEAL 180 tablet 1  . Cholecalciferol 50 MCG (2000 UT) TBDP Take 2,000 Units by mouth.    . levothyroxine (SYNTHROID) 25 MCG tablet TAKE ONE TABLET BY MOUTH EVERY MORNING WITH BREAKFAST 90 tablet 0  . Multiple Vitamin (MULTIVITAMIN WITH MINERALS) TABS tablet Take 1 tablet by mouth daily.    . Multiple Vitamins-Minerals (PRESERVISION AREDS 2) CAPS Take 1 capsule by mouth 2 (two) times daily.     Marland Kitchen MYRBETRIQ 25 MG TB24 tablet TAKE ONE TABLET BY MOUTH ONCE DAILY 30 tablet 5  . pantoprazole (PROTONIX) 40 MG tablet TAKE ONE TABLET BY MOUTH ONCE DAILY 90 tablet 1  . senna-docusate (SENOKOT-S) 8.6-50 MG per tablet Take 1 tablet by mouth 2 (two) times daily. 30 tablet 0  . torsemide (DEMADEX) 10 MG tablet Take 10 mg (1 tablet) by mouth daily for 5 days, then take 1 tablet daily as needed for shortness of breath or weight gain 3 lbs or greater overnight. 30 tablet 5   No facility-administered medications prior to visit.    Review of Systems;  Patient denies headache, fevers, malaise, unintentional weight loss, skin rash, eye pain, sinus congestion  and sinus pain, sore throat, dysphagia,  hemoptysis , cough, dyspnea, wheezing, chest pain, palpitations, orthopnea, edema, abdominal pain, nausea, melena, diarrhea, constipation, flank pain, dysuria, hematuria, urinary  Frequency, nocturia, numbness, tingling, seizures,  Focal weakness, Loss of consciousness,  Tremor, insomnia, depression, anxiety, and suicidal ideation.      Objective:  BP 138/70 (BP Location: Left Arm, Patient Position: Sitting)   Pulse 69   Temp 97.9 F (36.6 C)   Ht 5' 4.49" (1.638 m)   Wt 168 lb 6.4 oz (76.4 kg)   SpO2 95%   BMI 28.47 kg/m   BP Readings from  Last 3 Encounters:  07/28/20 138/70  07/04/20 130/70  03/07/20 140/72    Wt Readings from Last 3 Encounters:  07/28/20 168 lb 6.4 oz (76.4 kg)  07/04/20 164 lb (74.4 kg)  03/07/20 169 lb 6.4 oz (76.8 kg)    General appearance: alert, cooperative and appears stated age Ears: normal TM's and external ear canals both ears Throat: lips, mucosa, and tongue normal; teeth and gums normal Neck: no adenopathy, no carotid bruit, supple, symmetrical, trachea midline and thyroid not enlarged, symmetric, no tenderness/mass/nodules Back: symmetric, mild kyphosis,. ROM restricted.  No CVA tenderness. Lungs: clear to auscultation bilaterally Heart: regular rate and rhythm, S1, S2 normal, no murmur, click, rub or gallop Abdomen: soft, non-tender; bowel sounds normal; no masses,  no organomegaly Pulses: 2+ and symmetric Skin: Skin color, texture, turgor normal. No rashes or lesions Lymph nodes: Cervical, supraclavicular, and axillary nodes normal. MSK: unable to rise from chair without use of arms.  Distal strength 5/5 bilaterally in legs , 4+/5 in arms  Neuro:  awake and interactive with normal mood and affect. Higher cortical functions are normal. Speech is clear without word-finding difficulty or dysarthria. Extraocular movements are intact. Visual fields of both eyes are grossly intact. Sensation to light touch is grossly intact bilaterally of upper and lower extremities.     No results found for: HGBA1C  Lab Results  Component Value Date   CREATININE 0.87 07/28/2020   CREATININE 0.78 01/17/2020   CREATININE 0.88 08/19/2019    Lab Results  Component Value Date   WBC 6.6 07/28/2020   HGB 11.3 (L) 07/28/2020   HCT 33.6 (L) 07/28/2020   PLT 186 07/28/2020   GLUCOSE 100 (H) 07/28/2020   CHOL 132 01/17/2020   TRIG 73.0 01/17/2020   HDL 51.60 01/17/2020   LDLDIRECT 104.4 02/27/2011   LDLCALC 66 01/17/2020   ALT 13 07/28/2020   AST 20 07/28/2020   NA 138 07/28/2020   K 4.4 07/28/2020    CL 101 07/28/2020   CREATININE 0.87 07/28/2020   BUN 24 07/28/2020   CO2 28 07/28/2020   TSH 2.84 07/28/2020   INR 1.64 (H) 10/14/2014    No results found.  Assessment & Plan:   Problem List Items Addressed This Visit      Unprioritized   Upper extremity weakness - Primary    Mild,  But resulting in increased difficulty performing ADLs without assistance .  Home PT eval and treat.  May be a good candidate for a motorized scooter       Relevant Orders   AMB Referral to [place]   Ambulatory referral to [place] gait when walking    Due to chronic low back pain resulting in proximal muscle weakness and easy fatigueing.  CCM and home PT eval in progress.       Relevant Orders   AMB  Referral to [facility]   Ambulatory referral to [place]   S/P CABG x 4   Relevant Orders   AMB Referral to [facility]   Long-term use of high-risk medication   Iron deficiency anemia    Current iron stores are normal and she is slightly .  She is no longer taking iron supplements.  Will repeat in 3 months   Lab Results  Component Value Date   IRON 50 07/28/2020   TIBC 207 (L) 07/28/2020   FERRITIN 36 07/28/2020   Lab Results  Component Value Date   WBC 6.6 07/28/2020   HGB 11.3 (L) 07/28/2020   HCT 33.6 (L) 07/28/2020   MCV 94.9 07/28/2020   PLT 186 07/28/2020         Hypothyroidism (Chronic)   General weakness   Relevant Orders   AMB Referral to [place]   Ambulatory referral to [place]     A total of 40 minutes was spent with patient more than half of which was spent in counseling patient on the above mentioned issues , reviewing and explaining recent labs and imaging studies done, and coordination of care.  I am having Swayzee K. Duhart maintain her Calcium Carbonate-Vitamin D, multivitamin with minerals, acetaminophen, PreserVision AREDS 2, aspirin, senna-docusate, Cholecalciferol,  torsemide, atorvastatin, Myrbetriq, pantoprazole, levothyroxine, and carvedilol.  No orders of the defined types were placed in this encounter.   There are no discontinued medications.  Follow-up: No follow-ups on file.   [name], MD

## 2020-07-29 LAB — IRON,TIBC AND FERRITIN PANEL
%SAT: 24 % (calc) (ref 16–45)
Ferritin: 36 ng/mL (ref 16–288)
Iron: 50 ug/dL (ref 45–160)
TIBC: 207 mcg/dL (calc) — ABNORMAL LOW (ref 250–450)

## 2020-07-29 LAB — COMPREHENSIVE METABOLIC PANEL
AG Ratio: 1.7 (calc) (ref 1.0–2.5)
ALT: 13 U/L (ref 6–29)
AST: 20 U/L (ref 10–35)
Albumin: 4 g/dL (ref 3.6–5.1)
Alkaline phosphatase (APISO): 58 U/L (ref 37–153)
BUN: 24 mg/dL (ref 7–25)
CO2: 28 mmol/L (ref 20–32)
Calcium: 9.3 mg/dL (ref 8.6–10.4)
Chloride: 101 mmol/L (ref 98–110)
Creat: 0.87 mg/dL (ref 0.60–0.88)
Globulin: 2.3 g/dL (calc) (ref 1.9–3.7)
Glucose, Bld: 100 mg/dL — ABNORMAL HIGH (ref 65–99)
Potassium: 4.4 mmol/L (ref 3.5–5.3)
Sodium: 138 mmol/L (ref 135–146)
Total Bilirubin: 0.4 mg/dL (ref 0.2–1.2)
Total Protein: 6.3 g/dL (ref 6.1–8.1)

## 2020-07-29 LAB — CBC WITH DIFFERENTIAL/PLATELET
Absolute Monocytes: 693 cells/uL (ref 200–950)
Basophils Absolute: 73 cells/uL (ref 0–200)
Basophils Relative: 1.1 %
Eosinophils Absolute: 310 cells/uL (ref 15–500)
Eosinophils Relative: 4.7 %
HCT: 33.6 % — ABNORMAL LOW (ref 35.0–45.0)
Hemoglobin: 11.3 g/dL — ABNORMAL LOW (ref 11.7–15.5)
Lymphs Abs: 1102 cells/uL (ref 850–3900)
MCH: 31.9 pg (ref 27.0–33.0)
MCHC: 33.6 g/dL (ref 32.0–36.0)
MCV: 94.9 fL (ref 80.0–100.0)
MPV: 11.3 fL (ref 7.5–12.5)
Monocytes Relative: 10.5 %
Neutro Abs: 4422 cells/uL (ref 1500–7800)
Neutrophils Relative %: 67 %
Platelets: 186 10*3/uL (ref 140–400)
RBC: 3.54 10*6/uL — ABNORMAL LOW (ref 3.80–5.10)
RDW: 13 % (ref 11.0–15.0)
Total Lymphocyte: 16.7 %
WBC: 6.6 10*3/uL (ref 3.8–10.8)

## 2020-07-29 LAB — TSH: TSH: 2.84 mIU/L (ref 0.40–4.50)

## 2020-07-30 ENCOUNTER — Encounter: Payer: Self-pay | Admitting: Internal Medicine

## 2020-07-30 NOTE — Assessment & Plan Note (Signed)
Due to chronic low back pain resulting in proximal muscle weakness and easy fatigueing.  CCM and home PT eval in progress.

## 2020-07-30 NOTE — Assessment & Plan Note (Signed)
Mild,  But resulting in increased difficulty performing ADLs without assistance .  Home PT eval and treat.  May be a good candidate for a motorized scooter

## 2020-07-30 NOTE — Assessment & Plan Note (Signed)
Current iron stores are normal and she is slightly .  She is no longer taking iron supplements.  Will repeat in 3 months   Lab Results  Component Value Date   IRON 50 07/28/2020   TIBC 207 (L) 07/28/2020   FERRITIN 36 07/28/2020   Lab Results  Component Value Date   WBC 6.6 07/28/2020   HGB 11.3 (L) 07/28/2020   HCT 33.6 (L) 07/28/2020   MCV 94.9 07/28/2020   PLT 186 07/28/2020

## 2020-07-31 ENCOUNTER — Telehealth: Payer: Self-pay | Admitting: *Deleted

## 2020-07-31 NOTE — Chronic Care Management (AMB) (Signed)
  Chronic Care Management   Note  07/31/2020 Name: Jenna Marshall MRN: [id number] DOB: 1926/04/19  Jenna Marshall is a 87 y.o. year old female who is a primary care patient of Derrel Nip, Aris Everts, MD. I reached out to [name] by phone today in response to a referral sent by [name]'s PCP, [name], MD     [name] was given information about Chronic Care Management services today including:  1. CCM service includes personalized support from designated clinical staff supervised by her physician, including individualized plan of care and coordination with other care providers 2. 24/7 contact phone numbers for assistance for urgent and routine care needs. 3. Service will only be billed when office clinical staff spend 20 minutes or more in a month to coordinate care. 4. Only one practitioner may furnish and bill the service in a calendar month. 5. The patient may stop CCM services at any time (effective at the end of the month) by phone call to the office staff. 6. The patient will be responsible for cost sharing (co-pay) of up to 20% of the service fee (after annual deductible is met).  Ciro Backer, patient's daughter verbally agreed to assistance and services provided by embedded care coordination/care management team today.  Follow up plan: Telephone appointment with care management team member scheduled for 08/02/2020 [facility]

## 2020-08-02 ENCOUNTER — Telehealth: Payer: Medicare HMO

## 2020-08-02 ENCOUNTER — Telehealth: Payer: Self-pay | Admitting: *Deleted

## 2020-08-02 NOTE — Telephone Encounter (Signed)
  Chronic Care Management   Outreach Note  08/02/2020 Name: Jenna Marshall MRN: [id number] DOB: 10-01-1925  Referred by: [name], MD Reason for referral : Chronic Care Management (HTN, Fall Risk)   An unsuccessful telephone outreach was attempted today. Unable to leave voice message for daughter, [name] due to voicemail not engaging.  The patient was referred to the case management team for assistance with care management and care coordination.   Follow Up Plan: RNCM will seek assistance from Care Guide to reschedule appointment within the next 7 business days.  [name] RN, MSN RN Care Management Coordinator [place] [phone] [name]

## 2020-08-07 ENCOUNTER — Telehealth: Payer: Self-pay | Admitting: *Deleted

## 2020-08-07 NOTE — Chronic Care Management (AMB) (Signed)
  Care Management   Note  08/07/2020 Name: Natsumi Whitsitt MRN: [id number] DOB: 1926-04-02  Peggyann Juba Dorwart is a 87 y.o. year old female who is a primary care patient of Derrel Nip, Aris Everts, MD and is actively engaged with the care management team. I reached out to [name] by phone today to assist with re-scheduling a follow up visit with the RN Case Manager  Follow up plan: Unsuccessful telephone outreach attempt made.The care management team will reach out to the patient again over the next 3 days.  If patient returns call to provider office, please advise to call [place] Lysle Morales at [facility]

## 2020-08-08 DIAGNOSIS — I509 Heart failure, unspecified: Secondary | ICD-10-CM | POA: Diagnosis not present

## 2020-08-08 DIAGNOSIS — M1990 Unspecified osteoarthritis, unspecified site: Secondary | ICD-10-CM | POA: Diagnosis not present

## 2020-08-08 DIAGNOSIS — E039 Hypothyroidism, unspecified: Secondary | ICD-10-CM | POA: Diagnosis not present

## 2020-08-08 DIAGNOSIS — D519 Vitamin B12 deficiency anemia, unspecified: Secondary | ICD-10-CM | POA: Diagnosis not present

## 2020-08-08 DIAGNOSIS — I2510 Atherosclerotic heart disease of native coronary artery without angina pectoris: Secondary | ICD-10-CM | POA: Diagnosis not present

## 2020-08-08 DIAGNOSIS — D509 Iron deficiency anemia, unspecified: Secondary | ICD-10-CM | POA: Diagnosis not present

## 2020-08-08 DIAGNOSIS — G8929 Other chronic pain: Secondary | ICD-10-CM | POA: Diagnosis not present

## 2020-08-08 DIAGNOSIS — M810 Age-related osteoporosis without current pathological fracture: Secondary | ICD-10-CM | POA: Diagnosis not present

## 2020-08-08 DIAGNOSIS — I110 Hypertensive heart disease with heart failure: Secondary | ICD-10-CM | POA: Diagnosis not present

## 2020-08-10 DIAGNOSIS — I2510 Atherosclerotic heart disease of native coronary artery without angina pectoris: Secondary | ICD-10-CM | POA: Diagnosis not present

## 2020-08-10 DIAGNOSIS — I509 Heart failure, unspecified: Secondary | ICD-10-CM | POA: Diagnosis not present

## 2020-08-10 DIAGNOSIS — E039 Hypothyroidism, unspecified: Secondary | ICD-10-CM | POA: Diagnosis not present

## 2020-08-10 DIAGNOSIS — M810 Age-related osteoporosis without current pathological fracture: Secondary | ICD-10-CM | POA: Diagnosis not present

## 2020-08-10 DIAGNOSIS — M1990 Unspecified osteoarthritis, unspecified site: Secondary | ICD-10-CM | POA: Diagnosis not present

## 2020-08-10 DIAGNOSIS — D509 Iron deficiency anemia, unspecified: Secondary | ICD-10-CM | POA: Diagnosis not present

## 2020-08-10 DIAGNOSIS — D519 Vitamin B12 deficiency anemia, unspecified: Secondary | ICD-10-CM | POA: Diagnosis not present

## 2020-08-10 DIAGNOSIS — G8929 Other chronic pain: Secondary | ICD-10-CM | POA: Diagnosis not present

## 2020-08-10 DIAGNOSIS — I110 Hypertensive heart disease with heart failure: Secondary | ICD-10-CM | POA: Diagnosis not present

## 2020-08-14 DIAGNOSIS — N3941 Urge incontinence: Secondary | ICD-10-CM

## 2020-08-14 DIAGNOSIS — I509 Heart failure, unspecified: Secondary | ICD-10-CM | POA: Diagnosis not present

## 2020-08-14 DIAGNOSIS — Z9181 History of falling: Secondary | ICD-10-CM

## 2020-08-14 DIAGNOSIS — Z951 Presence of aortocoronary bypass graft: Secondary | ICD-10-CM

## 2020-08-14 DIAGNOSIS — I351 Nonrheumatic aortic (valve) insufficiency: Secondary | ICD-10-CM

## 2020-08-14 DIAGNOSIS — H3530 Unspecified macular degeneration: Secondary | ICD-10-CM

## 2020-08-14 DIAGNOSIS — Z85828 Personal history of other malignant neoplasm of skin: Secondary | ICD-10-CM

## 2020-08-14 DIAGNOSIS — D509 Iron deficiency anemia, unspecified: Secondary | ICD-10-CM | POA: Diagnosis not present

## 2020-08-14 DIAGNOSIS — E039 Hypothyroidism, unspecified: Secondary | ICD-10-CM | POA: Diagnosis not present

## 2020-08-14 DIAGNOSIS — E785 Hyperlipidemia, unspecified: Secondary | ICD-10-CM

## 2020-08-14 DIAGNOSIS — K219 Gastro-esophageal reflux disease without esophagitis: Secondary | ICD-10-CM

## 2020-08-14 DIAGNOSIS — E669 Obesity, unspecified: Secondary | ICD-10-CM

## 2020-08-14 DIAGNOSIS — I2510 Atherosclerotic heart disease of native coronary artery without angina pectoris: Secondary | ICD-10-CM | POA: Diagnosis not present

## 2020-08-14 DIAGNOSIS — Z8781 Personal history of (healed) traumatic fracture: Secondary | ICD-10-CM

## 2020-08-14 DIAGNOSIS — F329 Major depressive disorder, single episode, unspecified: Secondary | ICD-10-CM

## 2020-08-14 DIAGNOSIS — I110 Hypertensive heart disease with heart failure: Secondary | ICD-10-CM | POA: Diagnosis not present

## 2020-08-14 DIAGNOSIS — G8929 Other chronic pain: Secondary | ICD-10-CM | POA: Diagnosis not present

## 2020-08-14 DIAGNOSIS — D519 Vitamin B12 deficiency anemia, unspecified: Secondary | ICD-10-CM | POA: Diagnosis not present

## 2020-08-14 DIAGNOSIS — M810 Age-related osteoporosis without current pathological fracture: Secondary | ICD-10-CM | POA: Diagnosis not present

## 2020-08-14 DIAGNOSIS — M1990 Unspecified osteoarthritis, unspecified site: Secondary | ICD-10-CM | POA: Diagnosis not present

## 2020-08-14 DIAGNOSIS — Z7982 Long term (current) use of aspirin: Secondary | ICD-10-CM

## 2020-08-14 DIAGNOSIS — Z86718 Personal history of other venous thrombosis and embolism: Secondary | ICD-10-CM

## 2020-08-14 NOTE — Chronic Care Management (AMB) (Signed)
  Care Management   Note  08/14/2020 Name: Jenna Marshall MRN: [id number] DOB: [birth year]  [name] is a 87 y.o. year old female who is a primary care patient of Derrel Nip, Aris Everts, MD and is actively engaged with the care management team. I reached out to [name] by phone today to assist with re-scheduling a follow up visit with the RN Case Manager  Follow up plan: Unsuccessful telephone outreach attempt made.  The care management team will reach out to the patient again over the next 7 days.  If patient returns call to provider office, please advise to call [place] Lysle Morales at [facility]

## 2020-08-17 DIAGNOSIS — D519 Vitamin B12 deficiency anemia, unspecified: Secondary | ICD-10-CM | POA: Diagnosis not present

## 2020-08-17 DIAGNOSIS — G8929 Other chronic pain: Secondary | ICD-10-CM | POA: Diagnosis not present

## 2020-08-17 DIAGNOSIS — I509 Heart failure, unspecified: Secondary | ICD-10-CM | POA: Diagnosis not present

## 2020-08-17 DIAGNOSIS — I2510 Atherosclerotic heart disease of native coronary artery without angina pectoris: Secondary | ICD-10-CM | POA: Diagnosis not present

## 2020-08-17 DIAGNOSIS — E039 Hypothyroidism, unspecified: Secondary | ICD-10-CM | POA: Diagnosis not present

## 2020-08-17 DIAGNOSIS — M1990 Unspecified osteoarthritis, unspecified site: Secondary | ICD-10-CM | POA: Diagnosis not present

## 2020-08-17 DIAGNOSIS — I110 Hypertensive heart disease with heart failure: Secondary | ICD-10-CM | POA: Diagnosis not present

## 2020-08-17 DIAGNOSIS — D509 Iron deficiency anemia, unspecified: Secondary | ICD-10-CM | POA: Diagnosis not present

## 2020-08-17 DIAGNOSIS — M810 Age-related osteoporosis without current pathological fracture: Secondary | ICD-10-CM | POA: Diagnosis not present

## 2020-08-18 DIAGNOSIS — I509 Heart failure, unspecified: Secondary | ICD-10-CM | POA: Diagnosis not present

## 2020-08-18 DIAGNOSIS — E039 Hypothyroidism, unspecified: Secondary | ICD-10-CM | POA: Diagnosis not present

## 2020-08-18 DIAGNOSIS — M810 Age-related osteoporosis without current pathological fracture: Secondary | ICD-10-CM | POA: Diagnosis not present

## 2020-08-18 DIAGNOSIS — D509 Iron deficiency anemia, unspecified: Secondary | ICD-10-CM | POA: Diagnosis not present

## 2020-08-18 DIAGNOSIS — I2510 Atherosclerotic heart disease of native coronary artery without angina pectoris: Secondary | ICD-10-CM | POA: Diagnosis not present

## 2020-08-18 DIAGNOSIS — I110 Hypertensive heart disease with heart failure: Secondary | ICD-10-CM | POA: Diagnosis not present

## 2020-08-18 DIAGNOSIS — G8929 Other chronic pain: Secondary | ICD-10-CM | POA: Diagnosis not present

## 2020-08-18 DIAGNOSIS — M1990 Unspecified osteoarthritis, unspecified site: Secondary | ICD-10-CM | POA: Diagnosis not present

## 2020-08-18 DIAGNOSIS — D519 Vitamin B12 deficiency anemia, unspecified: Secondary | ICD-10-CM | POA: Diagnosis not present

## 2020-08-19 ENCOUNTER — Other Ambulatory Visit: Payer: Self-pay | Admitting: Internal Medicine

## 2020-08-19 ENCOUNTER — Other Ambulatory Visit: Payer: Self-pay | Admitting: Cardiovascular Disease

## 2020-08-21 NOTE — Chronic Care Management (AMB) (Signed)
  Care Management   Note  08/21/2020 Name: [name] MRN: [id number] DOB: 1925/10/26  Jenna Marshall is a 87 y.o. year old female who is a primary care patient of Derrel Nip, Aris Everts, MD and is actively engaged with the care management team. I reached out to [name] by phone today to assist with re-scheduling an initial visit with the RN Case Manager  Follow up plan: Unsuccessful telephone outreach attempt made. A HIPAA compliant phone message was left for the patient providing contact information and requesting a return call.  Unable to make contact on outreach attempts x 3. PCP [name] ,MD notified via routed documentation in medical record.  We have been unable to make contact with the patient for follow up. The care management team is available to follow up with the patient after provider conversation with the patient regarding recommendation for care management engagement and subsequent re-referral to the care management team.   [facility]

## 2020-08-22 ENCOUNTER — Ambulatory Visit: Payer: Medicare HMO | Admitting: Cardiovascular Disease

## 2020-08-22 ENCOUNTER — Other Ambulatory Visit: Payer: Self-pay | Admitting: Cardiovascular Disease

## 2020-08-22 ENCOUNTER — Encounter: Payer: Self-pay | Admitting: Cardiovascular Disease

## 2020-08-22 ENCOUNTER — Other Ambulatory Visit: Payer: Self-pay | Admitting: Internal Medicine

## 2020-08-22 ENCOUNTER — Other Ambulatory Visit: Payer: Self-pay

## 2020-08-22 VITALS — BP 180/70 | HR 65 | Ht 62.0 in | Wt 166.2 lb

## 2020-08-22 DIAGNOSIS — E785 Hyperlipidemia, unspecified: Secondary | ICD-10-CM

## 2020-08-22 DIAGNOSIS — I2510 Atherosclerotic heart disease of native coronary artery without angina pectoris: Secondary | ICD-10-CM

## 2020-08-22 DIAGNOSIS — I10 Essential (primary) hypertension: Secondary | ICD-10-CM | POA: Diagnosis not present

## 2020-08-22 NOTE — Patient Instructions (Addendum)
Medication Instructions:  - Your physician recommends that you continue on your current medications as directed. Please refer to the Current Medication list given to you today.  *If you need a refill on your cardiac medications before your next appointment, please call your pharmacy*   Lab Work: - none ordered  If you have labs (blood work) drawn today and your tests are completely normal, you will receive your results only by: Marland Kitchen MyChart Message (if you have MyChart) OR . A paper copy in the mail If you have any lab test that is abnormal or we need to change your treatment, we will call you to review the results.   Testing/Procedures: - none ordered   Follow-Up: At [facility], you and your health needs are our priority.  As part of our continuing mission to provide you with exceptional heart care, we have created designated Provider Care Teams.  These Care Teams include your primary Cardiologist (physician) and Advanced Practice Providers (APPs -  Physician Assistants and Nurse Practitioners) who all work together to provide you with the care you need, when you need it.  We recommend signing up for the patient portal called "MyChart".  Sign up information is provided on this After Visit Summary.  MyChart is used to connect with patients for Virtual Visits (Telemedicine).  Patients are able to view lab/test results, encounter notes, upcoming appointments, etc.  Non-urgent messages can be sent to your provider as well.   To learn more about what you can do with MyChart, go to [email or web address].    Your next appointment:   6 month(s)  The format for your next appointment:   In Person  Provider:   You may see [name], MD or one of the following Advanced Practice Providers on your designated Care Team:    Murray Hodgkins, NP  Christell Faith, PA-C  Marrianne Mood, PA-C  Cadence Lynn, [place]  Laurann Montana, NP    Other Instructions n/a

## 2020-08-22 NOTE — Progress Notes (Signed)
Cardiology Office Note   Date:  08/22/2020   ID:  Jenna Marshall, DOB 1925-08-04, MRN [id number]  PCP:  [name], MD  Cardiologist:   [name], MD   Chief Complaint  Patient presents with  . Other    6 month f/u no complaints today. Meds reviewed verbally with pt.      History of Present Illness: Jenna Marshall is a 87 y.o. female who presents for a follow up visit regarding CAD. She is s/p 4 vessel CABG on 10/14/2014 for severe LM stenosis, diastolic dysfunction, hypertension, and hyperlipidemia . She had a left hip fracture in August, 2016 after a mechanical fall which required surgery.   Most recent echocardiogram in September 2020 showed an EF of 55 to 60% with grade 2 diastolic dysfunction, mild to moderate mitral regurgitation and mildly calcified aortic valve with moderate regurgitation.  She has been doing well with no chest pain, shortness of breath or palpitations.  She walks with a walker and has poor balance overall. She seems to have whitecoat syndrome as her blood pressure is frequently elevated in our office but normal in other occasions.  Her daughter is with her and reports blood pressure less than 140/80 at home.  Past Medical History:  Diagnosis Date  . Aortic insufficiency    a. 03/2017 Echo: Mod AI; b. 02/2019 Echo: Mod AI.  Marland Kitchen Arthritis   . CAD (coronary artery disease)    a. Lexiscan 10/13/14: mid anterior to apical & inf wall ischemia w/ WMA, mild to mod dep EF; b. Cath 10/2014: LM 50ost, LM 99, ost LAD 95, 40p/m, RCA 30p, 64m; b. 10/2014 CABG x 4 (LIMA->LAD, VG->RCA, VG->RI->OM).  . Depression   . Diastolic dysfunction    a. 10/2014 Echo: EF 55-60%, no RWMA, GR1DD; b. 03/2017 Echo: EF 60-65%, mild LVH, Gr2 DD; c. 02/2019 Echo: EF 55-60%, sev LVH. Diast dysfxn. No rwma. RVSP 41.37mmHg. Mildly dil LA. Mild to mod MR. Mod TR. Mod AI.  Marland Kitchen GERD (gastroesophageal reflux disease)   . Hyperlipidemia   . Hypertension   . Hypothyroidism   . Macular  degeneration   . pernicious anemia   . Varicose veins   . Wears dentures    partial upper  . Wears hearing aid    right    Past Surgical History:  Procedure Laterality Date  . ANTERIOR VITRECTOMY Right 07/16/2016   Procedure: ANTERIOR VITRECTOMY;  Surgeon: Eulogio Bear, MD;  Location: [facility];  Service: Ophthalmology;  Laterality: Right;  . CARDIAC CATHETERIZATION Left 10/14/2014   Procedure: Left Heart Cath and Coronary Angiography;  Surgeon: Wellington Hampshire, MD;  Location: [facility];  Service: Cardiovascular;  Laterality: Left;  . CATARACT EXTRACTION W/PHACO Left 04/30/2016   Procedure: CATARACT EXTRACTION PHACO AND INTRAOCULAR LENS PLACEMENT (IOC);  Surgeon: Eulogio Bear, MD;  Location: [facility];  Service: Ophthalmology;  Laterality: Left;  LEFT  . CATARACT EXTRACTION W/PHACO Right 07/16/2016   Procedure: CATARACT EXTRACTION PHACO AND INTRAOCULAR LENS PLACEMENT ([place])  Right;  Surgeon: Eulogio Bear, MD;  Location: [facility];  Service: Ophthalmology;  Laterality: Right;  . CORONARY ARTERY BYPASS GRAFT N/A 10/14/2014   Procedure: CORONARY ARTERY BYPASS GRAFTING (CABG), ON PUMP, TIMES FOUR, USING LEFT INTERNAL MAMMARY ARTERY, RIGHT GREATER SAPHENOUS VEIN HARVESTED ENDOSCOPICALLY;  Surgeon: Gaye Pollack, MD;  Location: [facility];  Service: Open Heart Surgery;  Laterality: N/A;  . CORONARY ARTERY BYPASS GRAFT  10/2014  LIMA-->LAD, SVG-->RCA, sequential SVG-->Ramus and OM  . HEMORRHOID SURGERY    . HIP ARTHROPLASTY Left 01/14/2015   Procedure: ARTHROPLASTY BIPOLAR HIP (HEMIARTHROPLASTY);  Surgeon: Dereck Leep, MD;  Location: [facility];  Service: Orthopedics;  Laterality: Left;  Marland Kitchen VARICOSE VEIN SURGERY       Current Outpatient Medications  Medication Sig Dispense Refill  . acetaminophen (TYLENOL) 325 MG tablet Take 2 tablets (650 mg total) by mouth every 6 (six) hours as needed for mild pain.    Marland Kitchen aspirin 81 MG chewable tablet Chew 1  tablet (81 mg total) by mouth daily. 30 tablet 0  . atorvastatin (LIPITOR) 40 MG tablet Take 1 tablet (40 mg total) by mouth daily. Please keep upcoming appointment for further refills. Thank you! 30 tablet 0  . Calcium Carbonate-Vitamin D 600-200 MG-UNIT TABS Take 1 tablet by mouth 2 (two) times daily.    . carvedilol (COREG) 3.125 MG tablet TAKE ONE TABLET BY MOUTH TWICE DAILY WITH A MEAL 180 tablet 1  . Cholecalciferol 50 MCG (2000 UT) TBDP Take 2,000 Units by mouth.    . levothyroxine (SYNTHROID) 25 MCG tablet TAKE ONE TABLET BY MOUTH EVERY MORNING WITH BREAKFAST 90 tablet 0  . melatonin 3 MG TABS tablet Take 3 mg by mouth at bedtime.    . Multiple Vitamin (MULTIVITAMIN WITH MINERALS) TABS tablet Take 1 tablet by mouth daily.    . Multiple Vitamins-Minerals (PRESERVISION AREDS 2) CAPS Take 1 capsule by mouth 2 (two) times daily.     Marland Kitchen MYRBETRIQ 25 MG TB24 tablet TAKE ONE TABLET BY MOUTH ONCE DAILY 30 tablet 5  . pantoprazole (PROTONIX) 40 MG tablet TAKE ONE TABLET BY MOUTH ONCE DAILY 90 tablet 1  . senna-docusate (SENOKOT-S) 8.6-50 MG per tablet Take 1 tablet by mouth 2 (two) times daily. 30 tablet 0  . torsemide (DEMADEX) 10 MG tablet Take 10 mg (1 tablet) by mouth daily for 5 days, then take 1 tablet daily as needed for shortness of breath or weight gain 3 lbs or greater overnight. 30 tablet 5   No current facility-administered medications for this visit.    Allergies:   Ambien [zolpidem], Codeine, and Lasix [furosemide]    Social History:  The patient  reports that she has never smoked. She has never used smokeless tobacco. She reports that she does not drink alcohol and does not use drugs.   Family History:  The patient's family history includes Coronary artery disease in her brother and mother; Diabetes in her mother; Heart disease in her father.    ROS:  Please see the history of present illness.   Otherwise, review of systems are positive for none.   All other systems are reviewed  and negative.    PHYSICAL EXAM: VS:  BP (!) 180/70 (BP Location: Left Arm, Patient Position: Sitting, Cuff Size: Normal)   Pulse 65   Ht 5\' 2"  (1.575 m)   Wt 166 lb 4 oz (75.4 kg)   SpO2 96%   BMI 30.41 kg/m  , BMI Body mass index is 30.41 kg/m. GEN: Well nourished, well developed, in no acute distress  HEENT: normal  Neck: no JVD, carotid bruits, or masses Cardiac: RRR; no  rubs, or gallops,no edema . 2/6 systolic ejection murmur in the aortic area which is mid peaking. Respiratory:  clear to auscultation bilaterally, normal work of breathing GI: soft, nontender, nondistended, + BS MS: no deformity or atrophy  Skin: warm and dry, no rash Neuro:  Strength and sensation  are intact Psych: euthymic mood, full affect   EKG:  EKG is ordered today. The ekg ordered today demonstrates normal sinus rhythm with old septal infarct.  Minimal LVH.   Recent Labs: 07/28/2020: ALT 13; BUN 24; Creat 0.87; Hemoglobin 11.3; Platelets 186; Potassium 4.4; Sodium 138; TSH 2.84    Lipid Panel    Component Value Date/Time   CHOL 132 01/17/2020 1015   CHOL 114 12/23/2014 0826   TRIG 73.0 01/17/2020 1015   HDL 51.60 01/17/2020 1015   HDL 40 12/23/2014 0826   CHOLHDL 3 01/17/2020 1015   VLDL 14.6 01/17/2020 1015   LDLCALC 66 01/17/2020 1015   LDLCALC 56 12/23/2014 0826   LDLDIRECT 104.4 02/27/2011 1135      Wt Readings from Last 3 Encounters:  08/22/20 166 lb 4 oz (75.4 kg)  07/28/20 168 lb 6.4 oz (76.4 kg)  07/04/20 164 lb (74.4 kg)        ASSESSMENT AND PLAN:  1.  Coronary artery disease involving native coronary arteries without angina: She is doing extremely well with no anginal symptoms. EKG is unremarkable. Continue medical therapy.  2. Essential hypertension: Blood pressure is elevated today but she seems to have a component of whitecoat syndrome.  Blood pressure is controlled at home.  Continue small dose carvedilol for now.  Can consider adding an ARB or amlodipine in the  future if needed.  3. Hyperlipidemia: Continue treatment with atorvastatin. I reviewed most recent labs which showed an LDL of 66  4.  Mild to moderate mitral regurgitation and moderate aortic insufficiency with mildly calcified aortic valve.  Stable symptoms and heart murmur seems to be unchanged.    Disposition:   FU with me in 6 months  Signed,  [name], MD  08/22/2020 1:48 PM    [place]

## 2020-08-23 DIAGNOSIS — D509 Iron deficiency anemia, unspecified: Secondary | ICD-10-CM | POA: Diagnosis not present

## 2020-08-23 DIAGNOSIS — D519 Vitamin B12 deficiency anemia, unspecified: Secondary | ICD-10-CM | POA: Diagnosis not present

## 2020-08-23 DIAGNOSIS — G8929 Other chronic pain: Secondary | ICD-10-CM | POA: Diagnosis not present

## 2020-08-23 DIAGNOSIS — I110 Hypertensive heart disease with heart failure: Secondary | ICD-10-CM | POA: Diagnosis not present

## 2020-08-23 DIAGNOSIS — M810 Age-related osteoporosis without current pathological fracture: Secondary | ICD-10-CM | POA: Diagnosis not present

## 2020-08-23 DIAGNOSIS — I509 Heart failure, unspecified: Secondary | ICD-10-CM | POA: Diagnosis not present

## 2020-08-23 DIAGNOSIS — M1990 Unspecified osteoarthritis, unspecified site: Secondary | ICD-10-CM | POA: Diagnosis not present

## 2020-08-23 DIAGNOSIS — E039 Hypothyroidism, unspecified: Secondary | ICD-10-CM | POA: Diagnosis not present

## 2020-08-23 DIAGNOSIS — I2510 Atherosclerotic heart disease of native coronary artery without angina pectoris: Secondary | ICD-10-CM | POA: Diagnosis not present

## 2020-08-25 DIAGNOSIS — I2510 Atherosclerotic heart disease of native coronary artery without angina pectoris: Secondary | ICD-10-CM | POA: Diagnosis not present

## 2020-08-25 DIAGNOSIS — M810 Age-related osteoporosis without current pathological fracture: Secondary | ICD-10-CM | POA: Diagnosis not present

## 2020-08-25 DIAGNOSIS — M1990 Unspecified osteoarthritis, unspecified site: Secondary | ICD-10-CM | POA: Diagnosis not present

## 2020-08-25 DIAGNOSIS — I110 Hypertensive heart disease with heart failure: Secondary | ICD-10-CM | POA: Diagnosis not present

## 2020-08-25 DIAGNOSIS — G8929 Other chronic pain: Secondary | ICD-10-CM | POA: Diagnosis not present

## 2020-08-25 DIAGNOSIS — I509 Heart failure, unspecified: Secondary | ICD-10-CM | POA: Diagnosis not present

## 2020-08-25 DIAGNOSIS — D519 Vitamin B12 deficiency anemia, unspecified: Secondary | ICD-10-CM | POA: Diagnosis not present

## 2020-08-25 DIAGNOSIS — E039 Hypothyroidism, unspecified: Secondary | ICD-10-CM | POA: Diagnosis not present

## 2020-08-25 DIAGNOSIS — D509 Iron deficiency anemia, unspecified: Secondary | ICD-10-CM | POA: Diagnosis not present

## 2020-08-28 DIAGNOSIS — G8929 Other chronic pain: Secondary | ICD-10-CM | POA: Diagnosis not present

## 2020-08-28 DIAGNOSIS — I509 Heart failure, unspecified: Secondary | ICD-10-CM | POA: Diagnosis not present

## 2020-08-28 DIAGNOSIS — M810 Age-related osteoporosis without current pathological fracture: Secondary | ICD-10-CM | POA: Diagnosis not present

## 2020-08-28 DIAGNOSIS — D519 Vitamin B12 deficiency anemia, unspecified: Secondary | ICD-10-CM | POA: Diagnosis not present

## 2020-08-28 DIAGNOSIS — I2510 Atherosclerotic heart disease of native coronary artery without angina pectoris: Secondary | ICD-10-CM | POA: Diagnosis not present

## 2020-08-28 DIAGNOSIS — D509 Iron deficiency anemia, unspecified: Secondary | ICD-10-CM | POA: Diagnosis not present

## 2020-08-28 DIAGNOSIS — I110 Hypertensive heart disease with heart failure: Secondary | ICD-10-CM | POA: Diagnosis not present

## 2020-08-28 DIAGNOSIS — M1990 Unspecified osteoarthritis, unspecified site: Secondary | ICD-10-CM | POA: Diagnosis not present

## 2020-08-28 DIAGNOSIS — E039 Hypothyroidism, unspecified: Secondary | ICD-10-CM | POA: Diagnosis not present

## 2020-08-29 DIAGNOSIS — E039 Hypothyroidism, unspecified: Secondary | ICD-10-CM | POA: Diagnosis not present

## 2020-08-29 DIAGNOSIS — I2510 Atherosclerotic heart disease of native coronary artery without angina pectoris: Secondary | ICD-10-CM | POA: Diagnosis not present

## 2020-08-29 DIAGNOSIS — M1990 Unspecified osteoarthritis, unspecified site: Secondary | ICD-10-CM | POA: Diagnosis not present

## 2020-08-29 DIAGNOSIS — I509 Heart failure, unspecified: Secondary | ICD-10-CM | POA: Diagnosis not present

## 2020-08-29 DIAGNOSIS — M810 Age-related osteoporosis without current pathological fracture: Secondary | ICD-10-CM | POA: Diagnosis not present

## 2020-08-29 DIAGNOSIS — G8929 Other chronic pain: Secondary | ICD-10-CM | POA: Diagnosis not present

## 2020-08-29 DIAGNOSIS — D519 Vitamin B12 deficiency anemia, unspecified: Secondary | ICD-10-CM | POA: Diagnosis not present

## 2020-08-29 DIAGNOSIS — D509 Iron deficiency anemia, unspecified: Secondary | ICD-10-CM | POA: Diagnosis not present

## 2020-08-29 DIAGNOSIS — I110 Hypertensive heart disease with heart failure: Secondary | ICD-10-CM | POA: Diagnosis not present

## 2020-08-30 ENCOUNTER — Telehealth: Payer: Self-pay | Admitting: Internal Medicine

## 2020-08-30 ENCOUNTER — Ambulatory Visit: Payer: Medicare HMO | Admitting: *Deleted

## 2020-08-30 DIAGNOSIS — G8929 Other chronic pain: Secondary | ICD-10-CM

## 2020-08-30 DIAGNOSIS — M25552 Pain in left hip: Secondary | ICD-10-CM

## 2020-08-30 DIAGNOSIS — M810 Age-related osteoporosis without current pathological fracture: Secondary | ICD-10-CM | POA: Diagnosis not present

## 2020-08-30 DIAGNOSIS — E039 Hypothyroidism, unspecified: Secondary | ICD-10-CM | POA: Diagnosis not present

## 2020-08-30 DIAGNOSIS — I509 Heart failure, unspecified: Secondary | ICD-10-CM | POA: Diagnosis not present

## 2020-08-30 DIAGNOSIS — R531 Weakness: Secondary | ICD-10-CM

## 2020-08-30 DIAGNOSIS — R2681 Unsteadiness on feet: Secondary | ICD-10-CM

## 2020-08-30 DIAGNOSIS — M8580 Other specified disorders of bone density and structure, unspecified site: Secondary | ICD-10-CM

## 2020-08-30 DIAGNOSIS — R29898 Other symptoms and signs involving the musculoskeletal system: Secondary | ICD-10-CM

## 2020-08-30 DIAGNOSIS — Z8781 Personal history of (healed) traumatic fracture: Secondary | ICD-10-CM

## 2020-08-30 DIAGNOSIS — I110 Hypertensive heart disease with heart failure: Secondary | ICD-10-CM | POA: Diagnosis not present

## 2020-08-30 DIAGNOSIS — I5189 Other ill-defined heart diseases: Secondary | ICD-10-CM

## 2020-08-30 DIAGNOSIS — D519 Vitamin B12 deficiency anemia, unspecified: Secondary | ICD-10-CM | POA: Diagnosis not present

## 2020-08-30 DIAGNOSIS — M1990 Unspecified osteoarthritis, unspecified site: Secondary | ICD-10-CM | POA: Diagnosis not present

## 2020-08-30 DIAGNOSIS — D509 Iron deficiency anemia, unspecified: Secondary | ICD-10-CM | POA: Diagnosis not present

## 2020-08-30 DIAGNOSIS — I2510 Atherosclerotic heart disease of native coronary artery without angina pectoris: Secondary | ICD-10-CM | POA: Diagnosis not present

## 2020-08-30 NOTE — Addendum Note (Signed)
Addended by: Britt Bottom on: 08/30/2020 04:11 PM   Modules accepted: Orders

## 2020-08-30 NOTE — Telephone Encounter (Signed)
   Telephone encounter was:  Successful.  08/30/2020 Name: Jenna Marshall MRN: [id number] DOB: Sep 02, 1925  Jenna Marshall is a 87 y.o. year old female who is a primary care patient of Derrel Nip, Aris Everts, MD . The community resource team was consulted for assistance with [place] guide performed the following interventions: Discussed resources to assist with food insecurity. Spoke with patient's daughter Jenna Marshall regarding food insecurity. Vickie stated that she and her sister Jenna Marshall have been trying to get her mom signed up for Meals on Wheels.Informed Ms. Jenna Marshall  that Care Guide can send referral to [employer] Whe[employer] to get her mom (Jenna Marshall) signed up. Vickie stated understanding. Care Guide completed referral and e-mailed it to [name] at [employer] in [place]. .  Follow Up Plan:  Care guide will follow up with patient by phone over the next week.  and once results are established from [place] referral, Care Guide will give daughters a call to let them know.   [place], Care Management Phone: [phone] Email: [email or web address]

## 2020-08-30 NOTE — Telephone Encounter (Signed)
Is it possible for me to add this patient at 3:300 today so you can speak with her daughter.  [name]

## 2020-08-31 ENCOUNTER — Telehealth: Payer: Self-pay | Admitting: Internal Medicine

## 2020-08-31 NOTE — Telephone Encounter (Signed)
   Referral Reason: Food Insecurity  Interventions: Received message from [name] from Meals [employer] Wheels that Jenna Marshall referral has been processed and she will start receiving meals on Friday, September 01, 2020 until November 23, 2020.  No addtional needs at this time.   Follow up plan: No further follow up planned at this time. The patient has been provided with needed resources.  [place], Care Management Phone: [phone] Email: [email or web address]

## 2020-08-31 NOTE — Chronic Care Management (AMB) (Signed)
Chronic Care Management   CCM RN Visit Note  08/31/2020 Name: Jenna Marshall MRN: [id number] DOB: 10/03/1925  Subjective: Jenna Marshall is a 87 y.o. year old female who is a primary care patient of [place], Aris Everts, MD. The care management team was consulted for assistance with disease management and care coordination needs.    Engaged with patient by telephone for initial visit in response to provider referral for case management and/or care coordination services.   Consent to Services:  The patient was given the following information about Chronic Care Management services today, agreed to services, and gave verbal consent: 1. CCM service includes personalized support from designated clinical staff supervised by the primary care provider, including individualized plan of care and coordination with other care providers 2. 24/7 contact phone numbers for assistance for urgent and routine care needs. 3. Service will only be billed when office clinical staff spend 20 minutes or more in a month to coordinate care. 4. Only one practitioner may furnish and bill the service in a calendar month. 5.The patient may stop CCM services at any time (effective at the end of the month) by phone call to the office staff. 6. The patient will be responsible for cost sharing (co-pay) of up to 20% of the service fee (after annual deductible is met). Patient agreed to services and consent obtained.  Patient agreed to services and verbal consent obtained.   Assessment: Review of patient past medical history, allergies, medications, health status, including review of consultants reports, laboratory and other test data, was performed as part of comprehensive evaluation and provision of chronic care management services.   SDOH (Social Determinants of Health) assessments and interventions performed:  SDOH Interventions   Flowsheet Row Most Recent Value  SDOH Interventions   Food Insecurity Interventions Intervention Not  Indicated  Financial Strain Interventions Intervention Not Indicated  Intimate Partner Violence Interventions Intervention Not Indicated  Transportation Interventions Intervention Not Indicated       CCM Care Plan  Allergies  Allergen Reactions  . Ambien [Zolpidem] Other (See Comments)    Reaction:  Hallucinations  . Codeine Nausea And Vomiting  . Lasix [Furosemide] Itching and Rash    Outpatient Encounter Medications as of 08/30/2020  Medication Sig  . acetaminophen (TYLENOL) 325 MG tablet Take 2 tablets (650 mg total) by mouth every 6 (six) hours as needed for mild pain.  Marland Kitchen aspirin 81 MG chewable tablet Chew 1 tablet (81 mg total) by mouth daily.  Marland Kitchen atorvastatin (LIPITOR) 40 MG tablet Take 1 tablet (40 mg total) by mouth daily. Please keep upcoming appointment for further refills. Thank you!  . Calcium Carbonate-Vitamin D 600-200 MG-UNIT TABS Take 1 tablet by mouth 2 (two) times daily.  . carvedilol (COREG) 3.125 MG tablet TAKE ONE TABLET BY MOUTH TWICE DAILY WITH A MEAL  . Cholecalciferol 50 MCG (2000 UT) TBDP Take 2,000 Units by mouth.  . levothyroxine (SYNTHROID) 25 MCG tablet TAKE ONE TABLET BY MOUTH EVERY MORNING WITH BREAKFAST  . melatonin 3 MG TABS tablet Take 3 mg by mouth at bedtime.  . Multiple Vitamin (MULTIVITAMIN WITH MINERALS) TABS tablet Take 1 tablet by mouth daily.  . Multiple Vitamins-Minerals (PRESERVISION AREDS 2) CAPS Take 1 capsule by mouth 2 (two) times daily.   Marland Kitchen MYRBETRIQ 25 MG TB24 tablet TAKE ONE TABLET BY MOUTH ONCE DAILY  . pantoprazole (PROTONIX) 40 MG tablet TAKE ONE TABLET BY MOUTH ONCE DAILY  . senna-docusate (SENOKOT-S) 8.6-50 MG per tablet Take 1 tablet  by mouth 2 (two) times daily.  Marland Kitchen torsemide (DEMADEX) 10 MG tablet Take 10 mg (1 tablet) by mouth daily for 5 days, then take 1 tablet daily as needed for shortness of breath or weight gain 3 lbs or greater overnight.  . [DISCONTINUED] citalopram (CELEXA) 20 MG tablet Take 20 mg by mouth daily.      No facility-administered encounter medications on file as of 08/30/2020.    Patient Active Problem List   Diagnosis Date Noted  . Upper extremity weakness 07/04/2020  . General weakness 03/07/2020  . Unsteady gait when walking 03/07/2020  . Balance problem 03/07/2020  . Chronic left hip pain 01/17/2020  . Visit for preventive health examination 05/09/2016  . Nocturia more than twice per night 05/09/2016  . Neoplasm of skin of earlobe 11/03/2015  . Iron deficiency anemia 03/03/2015  . Left-sided thoracic back pain 03/03/2015  . History of hip fracture 01/13/2015  . Drug rash 12/29/2014  . S/P CABG x 4 10/14/2014  . CAD (coronary artery disease)   . Diastolic dysfunction   . Essential hypertension 10/11/2014  . Hyperlipidemia 10/11/2014  . Gastroesophageal reflux disease without esophagitis 10/11/2014  . Hypothyroidism 10/11/2014  . Medicare annual wellness visit, subsequent 04/01/2014  . Urinary incontinence, urge 09/25/2013  . Osteopenia of the elderly 05/13/2013  . Knee pain, bilateral 04/05/2013  . Long-term use of high-risk medication 03/12/2013  . History of shingles 09/02/2012  . History of DVT of lower extremity 09/02/2012  . Obesity (BMI 30-39.9) 09/01/2011  . Macular degeneration     Conditions to be addressed/monitored:Meals on Wheels and Fall Risk increased  Care Plan : Wellness (Adult)  Updates made by Leona Singleton, RN since 08/31/2020 12:00 AM  Problem: Increase fall risk and difficulties preparing meals for self   Priority: High  Goal: Daughter will report Meals on Wheels have been established for patient and no falls within the next 30 days   Start Date: 08/30/2020  Expected End Date: 10/30/2020  This Visit's Progress: On track  Priority: High  Current Barriers:  . Care Coordination needs related to needed assistance with arranging Meals on Wheels for patient and possible home personal care aide assistance in a patient with increasing fall risk while  trying to maintain independence at home alone.  Daughter reports 2 falls in the last year.  Last fall 3 weeks ago when Rolator was not locked and slipped away from patient while trying to sit.  No injuries.  Family has been trying to support with supplying meals for patient.  Does report patient dropping hot pan from stove and burning seat on Rolator.  Has been working with home health physical and occupational therapy with plans to extend their time to work with patient.  Per Care Guides notes on 08/31/20 Meals on Wheels has been arranged to start 09/01/20.  Spoke with daughter who confirms she has spoken with someone from [place] and they plan to start delivery on 09/01/20. Leodis [place] caregiver support. Film/video editor.  Family unable to afford to private pay for personal home care aides to assist with bathing and cooking. Nurse Case Manager Clinical Goal(s):  Marland Kitchen Over the next 30 days, patient/family will consider working with CCM Social Worker to apply for [employer] and [employer] from husbands previous Careers adviser. . Over the next 30 days, patient/family will work with care guide to address establishing Meals on Wheels . Patient and family will verbalize no falls within the next 30 days. Interventions:  .  Collaboration with [name], MD regarding development and update of comprehensive plan of care as evidenced by provider attestation and co-signature . Inter-disciplinary care team collaboration (see longitudinal plan of care) . Collaboration with Care Guide for Meals on Wheels . Advised patient to continue to weigh self daily, taking fluid medication/Demadex with weight guidelines . Provided education to patient re: fall precautions and preventions, use walker for all ambulation, make sure Rolator locked prior to sitting down, change positions slowly . Collaborated with CCM Social Worker [place] regarding patient possibly applying for [employer] and Attendance benefits from husbands  [employer] . Resources needed to improve safety identified . Risks to home and environmental safety identified . Strategies to improve or maintain safety promoted . Identified and reviewed with patient potential safety risks from home/living environment . Discussed private paying home care aides to assist with bathing and cooking . Encouraged daughter to consider seeking assistance from friends, church peers, possible nursing students to help care for patient . Emotional support given to patient and daughter . Confirmed patient has home health therapy arranged and coming out to home . Discussed with daughter working with CCM Education officer, museum for [employer] benefits or to help schedule appointment with  [facility] - [address] [place], [place] [address]; [phone], (daughter declines at this time, will research how long father served in [employer] and let RNCM know plans on next scheduled outreach) Patient Goals/Self-Care Activities: . Identify and remove trip and fall hazards . Keep emergency phone numbers nearby  . Continue to work with home health physical/occupational therapy . Use walker with all ambulation . Only use stove when family or friends are there to assist with getting food out oven or off stove . Work with [place] to get on list for Meals on Wheels Follow Up Plan: The care management team will reach out to the patient again over the next 10 business days.       Plan:The care management team will reach out to the patient again over the next 10 business days.   [name] RN, MSN RN Care Management Coordinator [place] [phone] [email or web address]

## 2020-08-31 NOTE — Patient Instructions (Signed)
Visit Information  Nice speaking with you.  Please let me know if you have questions or concerns.  You can contact  [facility] - [address] [place], [place] [address]; [phone], if you are wanting to arrange appointment for possible Aid and Attendance Benefits.  Sheppard Evens [phone]    PATIENT GOALS:  Goals Addressed            This Visit's Progress   . (RNCM) Keep My Home Safe       Timeframe:  Short-Term Goal Priority:  High Start Date: 08/30/20                            Expected End Date: 10/30/20                       Follow Up Date 09/08/20    . Identify and remove trip and fall hazards . Keep emergency phone numbers nearby  . Continue to work with home health physical/occupational therapy . Use walker with all ambulation . Only use stove when family or friends are there to assist with getting food out oven or off stove . Work with [place] to get on list for Meals on Wheels   Why is this important?    Accidents in and near your home can cause serious injury.   Take steps to avoid them.    Notes:     Consent to CCM Services: Ms. Soave was given information about Chronic Care Management services today including:  1. CCM service includes personalized support from designated clinical staff supervised by her physician, including individualized plan of care and coordination with other care providers 2. 24/7 contact phone numbers for assistance for urgent and routine care needs. 3. Service will only be billed when office clinical staff spend 20 minutes or more in a month to coordinate care. 4. Only one practitioner may furnish and bill the service in a calendar month. 5. The patient may stop CCM services at any time (effective at the end of the month) by phone call to the office staff. 6. The patient will be responsible for cost sharing (co-pay) of up to 20% of the service fee (after annual deductible is met).  Patient agreed to services and  verbal consent obtained.   Patient verbalizes understanding of instructions provided today and agrees to view in [place].   The care management team will reach out to the patient again over the next 10 business days.   [name] RN, MSN RN Care Management Coordinator [place] [phone] [email or web address]   CLINICAL CARE PLAN: Patient Care Plan: Wellness (Adult)  Problem Identified: Increase fall risk and difficulties preparing meals for self   Priority: High  Goal: Daughter will report Meals on Wheels have been established for patient and no falls within the next 30 days   Start Date: 08/30/2020  Expected End Date: 10/30/2020  This Visit's Progress: On track  Priority: High  Current Barriers:  . Care Coordination needs related to needed assistance with arranging Meals on Wheels for patient and possible home personal care aide assistance in a patient with increasing fall risk while trying to maintain independence at home alone.  Daughter reports 2 falls in the last year.  Last fall 3 weeks ago when Rolator was not locked and slipped away from patient while trying to sit.  No injuries.  Family has been trying to support with supplying  meals for patient.  Does report patient dropping hot pan from stove and burning seat on Rolator.  Has been working with home health physical and occupational therapy with plans to extend their time to work with patient.  Per Care Guides notes on 08/31/20 Meals on Wheels has been arranged to start 09/01/20.  Spoke with daughter who confirms she has spoken with someone from [place] and they plan to start delivery on 09/01/20. Leodis [place] caregiver support. Film/video editor.  Family unable to afford to private pay for personal home care aides to assist with bathing and cooking. Nurse Case Manager Clinical Goal(s):  Marland Kitchen Over the next 30 days, patient/family will consider working with CCM Social Worker to apply for [employer] and  [employer] from husbands previous Careers adviser. . Over the next 30 days, patient/family will work with care guide to address establishing Meals on Wheels . Patient and family will verbalize no falls within the next 30 days. Interventions:  . Collaboration with [name], MD regarding development and update of comprehensive plan of care as evidenced by provider attestation and co-signature . Inter-disciplinary care team collaboration (see longitudinal plan of care) . Collaboration with Care Guide for Meals on Wheels . Advised patient to continue to weigh self daily, taking fluid medication/Demadex with weight guidelines . Provided education to patient re: fall precautions and preventions, use walker for all ambulation, make sure Rolator locked prior to sitting down, change positions slowly . Collaborated with CCM Social Worker [place] regarding patient possibly applying for [employer] and Attendance benefits from husbands [employer] . Resources needed to improve safety identified . Risks to home and environmental safety identified . Strategies to improve or maintain safety promoted . Identified and reviewed with patient potential safety risks from home/living environment . Discussed private paying home care aides to assist with bathing and cooking . Encouraged daughter to consider seeking assistance from friends, church peers, possible nursing students to help care for patient . Emotional support given to patient and daughter . Confirmed patient has home health therapy arranged and coming out to home . Discussed with daughter working with CCM Education officer, museum for [employer] benefits or to help schedule appointment with  [facility] - [address], [place], [place] [address]; [phone], (daughter declines at this time, will research how long father served in [employer] and let RNCM know plans on next scheduled outreach) Patient Goals/Self-Care  Activities: . Identify and remove trip and fall hazards . Keep emergency phone numbers nearby  . Continue to work with home health physical/occupational therapy . Use walker with all ambulation . Only use stove when family or friends are there to assist with getting food out oven or off stove . Work with [place] to get on list for Meals on Wheels Follow Up Plan: The care management team will reach out to the patient again over the next 10 business days.

## 2020-09-01 ENCOUNTER — Telehealth: Payer: Medicare HMO

## 2020-09-05 DIAGNOSIS — E039 Hypothyroidism, unspecified: Secondary | ICD-10-CM | POA: Diagnosis not present

## 2020-09-05 DIAGNOSIS — D509 Iron deficiency anemia, unspecified: Secondary | ICD-10-CM | POA: Diagnosis not present

## 2020-09-05 DIAGNOSIS — G8929 Other chronic pain: Secondary | ICD-10-CM | POA: Diagnosis not present

## 2020-09-05 DIAGNOSIS — I2510 Atherosclerotic heart disease of native coronary artery without angina pectoris: Secondary | ICD-10-CM | POA: Diagnosis not present

## 2020-09-05 DIAGNOSIS — M810 Age-related osteoporosis without current pathological fracture: Secondary | ICD-10-CM | POA: Diagnosis not present

## 2020-09-05 DIAGNOSIS — I509 Heart failure, unspecified: Secondary | ICD-10-CM | POA: Diagnosis not present

## 2020-09-05 DIAGNOSIS — M1990 Unspecified osteoarthritis, unspecified site: Secondary | ICD-10-CM | POA: Diagnosis not present

## 2020-09-05 DIAGNOSIS — D519 Vitamin B12 deficiency anemia, unspecified: Secondary | ICD-10-CM | POA: Diagnosis not present

## 2020-09-05 DIAGNOSIS — I110 Hypertensive heart disease with heart failure: Secondary | ICD-10-CM | POA: Diagnosis not present

## 2020-09-07 DIAGNOSIS — D509 Iron deficiency anemia, unspecified: Secondary | ICD-10-CM | POA: Diagnosis not present

## 2020-09-07 DIAGNOSIS — M1990 Unspecified osteoarthritis, unspecified site: Secondary | ICD-10-CM | POA: Diagnosis not present

## 2020-09-07 DIAGNOSIS — I2510 Atherosclerotic heart disease of native coronary artery without angina pectoris: Secondary | ICD-10-CM | POA: Diagnosis not present

## 2020-09-07 DIAGNOSIS — I509 Heart failure, unspecified: Secondary | ICD-10-CM | POA: Diagnosis not present

## 2020-09-07 DIAGNOSIS — E039 Hypothyroidism, unspecified: Secondary | ICD-10-CM | POA: Diagnosis not present

## 2020-09-07 DIAGNOSIS — I110 Hypertensive heart disease with heart failure: Secondary | ICD-10-CM | POA: Diagnosis not present

## 2020-09-07 DIAGNOSIS — D519 Vitamin B12 deficiency anemia, unspecified: Secondary | ICD-10-CM | POA: Diagnosis not present

## 2020-09-07 DIAGNOSIS — M810 Age-related osteoporosis without current pathological fracture: Secondary | ICD-10-CM | POA: Diagnosis not present

## 2020-09-07 DIAGNOSIS — G8929 Other chronic pain: Secondary | ICD-10-CM | POA: Diagnosis not present

## 2020-09-08 ENCOUNTER — Ambulatory Visit: Payer: Medicare HMO | Admitting: *Deleted

## 2020-09-08 DIAGNOSIS — I509 Heart failure, unspecified: Secondary | ICD-10-CM | POA: Diagnosis not present

## 2020-09-08 DIAGNOSIS — G8929 Other chronic pain: Secondary | ICD-10-CM | POA: Diagnosis not present

## 2020-09-08 DIAGNOSIS — I2510 Atherosclerotic heart disease of native coronary artery without angina pectoris: Secondary | ICD-10-CM | POA: Diagnosis not present

## 2020-09-08 DIAGNOSIS — M810 Age-related osteoporosis without current pathological fracture: Secondary | ICD-10-CM | POA: Diagnosis not present

## 2020-09-08 DIAGNOSIS — M1990 Unspecified osteoarthritis, unspecified site: Secondary | ICD-10-CM | POA: Diagnosis not present

## 2020-09-08 DIAGNOSIS — I110 Hypertensive heart disease with heart failure: Secondary | ICD-10-CM | POA: Diagnosis not present

## 2020-09-08 DIAGNOSIS — E039 Hypothyroidism, unspecified: Secondary | ICD-10-CM | POA: Diagnosis not present

## 2020-09-08 DIAGNOSIS — D509 Iron deficiency anemia, unspecified: Secondary | ICD-10-CM | POA: Diagnosis not present

## 2020-09-08 DIAGNOSIS — D519 Vitamin B12 deficiency anemia, unspecified: Secondary | ICD-10-CM | POA: Diagnosis not present

## 2020-09-08 NOTE — Chronic Care Management (AMB) (Signed)
Care Management    RN Visit Note  09/08/2020 Name: Jenna Marshall MRN: [id number] DOB: 1926/02/14  Subjective: Jenna Marshall is a 87 y.o. year old female who is a primary care patient of Derrel Nip, Aris Everts, MD. The care management team was consulted for assistance with disease management and care coordination needs.    Engaged with patient by telephone for follow up visit in response to provider referral for case management and/or care coordination services.   Consent to Services:   Ms. [name] was given information about Care Management services today including:  1. Care Management services includes personalized support from designated clinical staff supervised by her physician, including individualized plan of care and coordination with other care providers 2. 24/7 contact phone numbers for assistance for urgent and routine care needs. 3. The patient may stop case management services at any time by phone call to the office staff.  Patient agreed to services and consent obtained.   Assessment: Review of patient past medical history, allergies, medications, health status, including review of consultants reports, laboratory and other test data, was performed as part of comprehensive evaluation and provision of chronic care management services.   SDOH (Social Determinants of Health) assessments and interventions performed:    Care Plan  Allergies  Allergen Reactions  . Ambien [Zolpidem] Other (See Comments)    Reaction:  Hallucinations  . Codeine Nausea And Vomiting  . Lasix [Furosemide] Itching and Rash    Outpatient Encounter Medications as of 09/08/2020  Medication Sig  . acetaminophen (TYLENOL) 325 MG tablet Take 2 tablets (650 mg total) by mouth every 6 (six) hours as needed for mild pain.  Marland Kitchen aspirin 81 MG chewable tablet Chew 1 tablet (81 mg total) by mouth daily.  Marland Kitchen atorvastatin (LIPITOR) 40 MG tablet Take 1 tablet (40 mg total) by mouth daily. Please keep upcoming appointment  for further refills. Thank you!  . Calcium Carbonate-Vitamin D 600-200 MG-UNIT TABS Take 1 tablet by mouth 2 (two) times daily.  . carvedilol (COREG) 3.125 MG tablet TAKE ONE TABLET BY MOUTH TWICE DAILY WITH A MEAL  . Cholecalciferol 50 MCG (2000 UT) TBDP Take 2,000 Units by mouth.  . levothyroxine (SYNTHROID) 25 MCG tablet TAKE ONE TABLET BY MOUTH EVERY MORNING WITH BREAKFAST  . melatonin 3 MG TABS tablet Take 3 mg by mouth at bedtime.  . Multiple Vitamin (MULTIVITAMIN WITH MINERALS) TABS tablet Take 1 tablet by mouth daily.  . Multiple Vitamins-Minerals (PRESERVISION AREDS 2) CAPS Take 1 capsule by mouth 2 (two) times daily.   Marland Kitchen MYRBETRIQ 25 MG TB24 tablet TAKE ONE TABLET BY MOUTH ONCE DAILY  . pantoprazole (PROTONIX) 40 MG tablet TAKE ONE TABLET BY MOUTH ONCE DAILY  . senna-docusate (SENOKOT-S) 8.6-50 MG per tablet Take 1 tablet by mouth 2 (two) times daily.  Marland Kitchen torsemide (DEMADEX) 10 MG tablet Take 10 mg (1 tablet) by mouth daily for 5 days, then take 1 tablet daily as needed for shortness of breath or weight gain 3 lbs or greater overnight.  . [DISCONTINUED] citalopram (CELEXA) 20 MG tablet Take 20 mg by mouth daily.     No facility-administered encounter medications on file as of 09/08/2020.    Patient Active Problem List   Diagnosis Date Noted  . Upper extremity weakness 07/04/2020  . General weakness 03/07/2020  . Unsteady gait when walking 03/07/2020  . Balance problem 03/07/2020  . Chronic left hip pain 01/17/2020  . Visit for preventive health examination 05/09/2016  . Nocturia more  than twice per night 05/09/2016  . Neoplasm of skin of earlobe 11/03/2015  . Iron deficiency anemia 03/03/2015  . Left-sided thoracic back pain 03/03/2015  . History of hip fracture 01/13/2015  . Drug rash 12/29/2014  . S/P CABG x 4 10/14/2014  . CAD (coronary artery disease)   . Diastolic dysfunction   . Essential hypertension 10/11/2014  . Hyperlipidemia 10/11/2014  . Gastroesophageal reflux  disease without esophagitis 10/11/2014  . Hypothyroidism 10/11/2014  . Medicare annual wellness visit, subsequent 04/01/2014  . Urinary incontinence, urge 09/25/2013  . Osteopenia of the elderly 05/13/2013  . Knee pain, bilateral 04/05/2013  . Long-term use of high-risk medication 03/12/2013  . History of shingles 09/02/2012  . History of DVT of lower extremity 09/02/2012  . Obesity (BMI 30-39.9) 09/01/2011  . Macular degeneration     Conditions to be addressed/monitored: Meals on Wheels  Care Plan : Wellness (Adult)  Updates made by Leona Singleton, RN since 09/08/2020 12:00 AM  Completed 09/08/2020  Problem: Increase fall risk and difficulties preparing meals for self Resolved 09/08/2020  Priority: High  Goal: Daughter will report Meals on Wheels have been established for patient and no falls within the next 30 days Completed 09/08/2020  Start Date: 08/30/2020  Expected End Date: 10/30/2020  This Visit's Progress: On track  Recent Progress: On track  Priority: High  Current Barriers:  . Care Coordination needs related to needed assistance with arranging Meals on Wheels for patient and possible home personal care aide assistance in a patient with increasing fall risk while trying to maintain independence at home alone.  Daughter reports patient is receiving Meals on Wheels and is happy with the services.  States father did not serve in armed services greater than 4 years and they (patient and family) feel patient is ok at this time.  Does report home health therapies are ending soon and family will help as much as they can. Denies any further needs at this time. Jenna Marshall caregiver support. Film/video editor.  Family unable to afford to private pay for personal home care aides to assist with bathing and cooking. Nurse Case Manager Clinical Goal(s):  Marland Kitchen Over the next 30 days, patient/family will consider working with CCM Social Worker to apply for [employer] and [employer] from husbands  previous Careers adviser. . Over the next 30 days, patient/family will work with care guide to address establishing Meals on Wheels . Patient and family will verbalize no falls within the next 30 days. Interventions:  . Collaboration with [name], MD regarding development and update of comprehensive plan of care as evidenced by provider attestation and co-signature . Inter-disciplinary care team collaboration (see longitudinal plan of care) . Collaboration with Care Guide for Meals on Wheels . Advised patient to continue to weigh self daily, taking fluid medication/Demadex with weight guidelines . Provided education to patient re: fall precautions and preventions, use walker for all ambulation, make sure Rolator locked prior to sitting down, change positions slowly . Collaborated with CCM Social Worker Collins regarding patient possibly applying for [employer] and Attendance benefits from husbands [employer] . Resources needed to improve safety identified . Risks to home and environmental safety identified . Strategies to improve or maintain safety promoted . Identified and reviewed with patient potential safety risks from home/living environment . Discussed private paying home care aides to assist with bathing and cooking . Encouraged daughter to consider seeking assistance from friends, church peers, possible nursing students to help care  for patient . Emotional support given to patient and daughter . Confirmed patient has home health therapy arranged and coming out to home . Discussed with daughter working with CCM Education officer, museum for [employer] benefits or to help schedule appointment with  [facility] - [address], [place], [place] [address]; [phone], (daughter declines at this time, will research how long father served in [employer] and let RNCM know plans on next scheduled outreach) Patient Goals/Self-Care Activities: . Identify and remove trip and fall  hazards . Keep emergency phone numbers nearby  . Continue to work with home health physical/occupational therapy . Use walker with all ambulation . Only use stove when family or friends are there to assist with getting food out oven or off stove . Work with Care Guide to get on list for Meals on Wheels Follow Up Plan: The patient's daughter, [name] has been provided with contact information for the care management team and has been advised to call with any health related questions or concerns.  No further follow up required at this time, per daughter all needs have been met at this time.      Plan: The patient's daughter, [name] has been provided with contact information for the care management team and has been advised to call with any health related questions or concerns.  and No further follow up required due to daughter reporting all needs met at this time.  [name] RN, MSN RN Care Management Coordinator [place] [phone] [email or web address]

## 2020-09-08 NOTE — Patient Instructions (Addendum)
Visit Information   It has been a pleasure meeting and speaking with you.  Glad we were able to assist with getting Meals on Wheels established.  Please call us if you have any questins or concerns.  Thanks,  [name] RN Care Manager   [phone]   Goals Addressed            This Visit's Progress   . COMPLETED: (RNCM) Keep My Home Safe       Timeframe:  Short-Term Goal Priority:  High Start Date: 08/30/20                            Expected End Date: 10/30/20                       Follow Up Date 09/08/20    . Identify and remove trip and fall hazards . Keep emergency phone numbers nearby  . Continue to work with home health physical/occupational therapy . Use walker with all ambulation . Only use stove when family or friends are there to assist with getting food out oven or off stove . Work with [place] to get on list for Meals on Wheels   Why is this important?    Accidents in and near your home can cause serious injury.   Take steps to avoid them.    Notes: Daughter reports patient is receiving Meals on Wheels and is happy with the services.  Denies any further needs at this time       Patient verbalizes understanding of instructions provided today and agrees to view in [place].   The patient's daughter has been provided with contact information for the care management team and has been advised to call with any health related questions or concerns.  No further follow up required at this time as daughter reports needs are met.  [name] RN, MSN RN Care Management Coordinator [place] [phone] [email or web address]

## 2020-09-11 DIAGNOSIS — I2510 Atherosclerotic heart disease of native coronary artery without angina pectoris: Secondary | ICD-10-CM | POA: Diagnosis not present

## 2020-09-11 DIAGNOSIS — G8929 Other chronic pain: Secondary | ICD-10-CM | POA: Diagnosis not present

## 2020-09-11 DIAGNOSIS — D509 Iron deficiency anemia, unspecified: Secondary | ICD-10-CM | POA: Diagnosis not present

## 2020-09-11 DIAGNOSIS — I110 Hypertensive heart disease with heart failure: Secondary | ICD-10-CM | POA: Diagnosis not present

## 2020-09-11 DIAGNOSIS — I509 Heart failure, unspecified: Secondary | ICD-10-CM | POA: Diagnosis not present

## 2020-09-11 DIAGNOSIS — E039 Hypothyroidism, unspecified: Secondary | ICD-10-CM | POA: Diagnosis not present

## 2020-09-11 DIAGNOSIS — D519 Vitamin B12 deficiency anemia, unspecified: Secondary | ICD-10-CM | POA: Diagnosis not present

## 2020-09-11 DIAGNOSIS — M810 Age-related osteoporosis without current pathological fracture: Secondary | ICD-10-CM | POA: Diagnosis not present

## 2020-09-11 DIAGNOSIS — M1990 Unspecified osteoarthritis, unspecified site: Secondary | ICD-10-CM | POA: Diagnosis not present

## 2020-09-13 DIAGNOSIS — I509 Heart failure, unspecified: Secondary | ICD-10-CM | POA: Diagnosis not present

## 2020-09-13 DIAGNOSIS — D509 Iron deficiency anemia, unspecified: Secondary | ICD-10-CM | POA: Diagnosis not present

## 2020-09-13 DIAGNOSIS — E039 Hypothyroidism, unspecified: Secondary | ICD-10-CM | POA: Diagnosis not present

## 2020-09-13 DIAGNOSIS — G8929 Other chronic pain: Secondary | ICD-10-CM | POA: Diagnosis not present

## 2020-09-13 DIAGNOSIS — M810 Age-related osteoporosis without current pathological fracture: Secondary | ICD-10-CM | POA: Diagnosis not present

## 2020-09-13 DIAGNOSIS — I2510 Atherosclerotic heart disease of native coronary artery without angina pectoris: Secondary | ICD-10-CM | POA: Diagnosis not present

## 2020-09-13 DIAGNOSIS — M1990 Unspecified osteoarthritis, unspecified site: Secondary | ICD-10-CM | POA: Diagnosis not present

## 2020-09-13 DIAGNOSIS — D519 Vitamin B12 deficiency anemia, unspecified: Secondary | ICD-10-CM | POA: Diagnosis not present

## 2020-09-13 DIAGNOSIS — I110 Hypertensive heart disease with heart failure: Secondary | ICD-10-CM | POA: Diagnosis not present

## 2020-09-27 DIAGNOSIS — H353211 Exudative age-related macular degeneration, right eye, with active choroidal neovascularization: Secondary | ICD-10-CM | POA: Diagnosis not present

## 2020-10-04 ENCOUNTER — Other Ambulatory Visit: Payer: Self-pay | Admitting: Internal Medicine

## 2020-10-31 ENCOUNTER — Telehealth: Payer: Self-pay | Admitting: *Deleted

## 2020-10-31 DIAGNOSIS — D508 Other iron deficiency anemias: Secondary | ICD-10-CM

## 2020-10-31 NOTE — Addendum Note (Signed)
Addended by: [name] on: 10/31/2020 12:40 PM   Modules accepted: Orders

## 2020-10-31 NOTE — Telephone Encounter (Signed)
Please place future orders for lab appt.  

## 2020-11-03 ENCOUNTER — Other Ambulatory Visit: Payer: Self-pay

## 2020-11-03 ENCOUNTER — Other Ambulatory Visit (INDEPENDENT_AMBULATORY_CARE_PROVIDER_SITE_OTHER): Payer: Medicare HMO

## 2020-11-03 DIAGNOSIS — D508 Other iron deficiency anemias: Secondary | ICD-10-CM | POA: Diagnosis not present

## 2020-11-03 LAB — CBC WITH DIFFERENTIAL/PLATELET
Basophils Absolute: 0 10*3/uL (ref 0.0–0.1)
Basophils Relative: 0.8 % (ref 0.0–3.0)
Eosinophils Absolute: 0.1 10*3/uL (ref 0.0–0.7)
Eosinophils Relative: 2.3 % (ref 0.0–5.0)
HCT: 35 % — ABNORMAL LOW (ref 36.0–46.0)
Hemoglobin: 12 g/dL (ref 12.0–15.0)
Lymphocytes Relative: 17.7 % (ref 12.0–46.0)
Lymphs Abs: 1.1 10*3/uL (ref 0.7–4.0)
MCHC: 34.5 g/dL (ref 30.0–36.0)
MCV: 95.1 fl (ref 78.0–100.0)
Monocytes Absolute: 0.6 10*3/uL (ref 0.1–1.0)
Monocytes Relative: 10.3 % (ref 3.0–12.0)
Neutro Abs: 4.2 10*3/uL (ref 1.4–7.7)
Neutrophils Relative %: 68.9 % (ref 43.0–77.0)
Platelets: 149 10*3/uL — ABNORMAL LOW (ref 150.0–400.0)
RBC: 3.67 Mil/uL — ABNORMAL LOW (ref 3.87–5.11)
RDW: 14.9 % (ref 11.5–15.5)
WBC: 6.1 10*3/uL (ref 4.0–10.5)

## 2020-11-04 LAB — IRON,TIBC AND FERRITIN PANEL
%SAT: 24 % (calc) (ref 16–45)
Ferritin: 27 ng/mL (ref 16–288)
Iron: 50 ug/dL (ref 45–160)
TIBC: 210 mcg/dL (calc) — ABNORMAL LOW (ref 250–450)

## 2020-11-21 ENCOUNTER — Other Ambulatory Visit: Payer: Self-pay | Admitting: Internal Medicine

## 2020-12-06 DIAGNOSIS — H353211 Exudative age-related macular degeneration, right eye, with active choroidal neovascularization: Secondary | ICD-10-CM | POA: Diagnosis not present

## 2020-12-13 ENCOUNTER — Ambulatory Visit (INDEPENDENT_AMBULATORY_CARE_PROVIDER_SITE_OTHER): Payer: Medicare HMO

## 2020-12-13 VITALS — Ht 62.0 in | Wt 166.0 lb

## 2020-12-13 DIAGNOSIS — Z0000 Encounter for general adult medical examination without abnormal findings: Secondary | ICD-10-CM | POA: Diagnosis not present

## 2020-12-13 NOTE — Progress Notes (Addendum)
Subjective:   Jenna Marshall is a 87 y.o. female who presents for Medicare Annual (Subsequent) preventive examination.  Review of Systems    No ROS.  Medicare Wellness Virtual Visit.  Visual/audio telehealth visit, UTA vital signs.   See social history for additional risk factors.   Cardiac Risk Factors include: advanced age (>36men, >62 women);hypertension     Objective:    Today's Vitals   12/13/20 1234  Weight: 166 lb (75.3 kg)  Height: 5\' 2"  (1.575 m)   Body mass index is 30.36 kg/m.  Advanced Directives 12/13/2020 08/30/2020 12/13/2019 12/10/2018 09/17/2017 09/04/2016 07/16/2016  Does Patient Have a Medical Advance Directive? Yes Yes Yes Yes Yes Yes No  Type of Paramedic of [place];Living will Healthcare Power of [place];Living will [place];Living will Healthcare Power of [place];Living will -  Does patient want to make changes to medical advance directive? No - Patient declined - No - Patient declined No - Patient declined No - Patient declined No - Patient declined -  Copy of [place] in Chart? Yes - validated most recent copy scanned in chart (See row information) No - copy requested Yes - validated most recent copy scanned in chart (See row information) Yes - validated most recent copy scanned in chart (See row information) No - copy requested No - copy requested -  Would patient like information on creating a medical advance directive? - - - - - - No - Patient declined    Current Medications (verified) Outpatient Encounter Medications as of 12/13/2020  Medication Sig   acetaminophen (TYLENOL) 325 MG tablet Take 2 tablets (650 mg total) by mouth every 6 (six) hours as needed for mild pain.   aspirin 81 MG chewable tablet Chew 1 tablet (81 mg total) by mouth daily.   atorvastatin (LIPITOR) 40 MG tablet Take 1 tablet (40 mg total) by mouth daily. Please  keep upcoming appointment for further refills. Thank you!   Calcium Carbonate-Vitamin D 600-200 MG-UNIT TABS Take 1 tablet by mouth 2 (two) times daily.   carvedilol (COREG) 3.125 MG tablet TAKE ONE TABLET BY MOUTH TWICE DAILY WITH A MEAL   Cholecalciferol 50 MCG (2000 UT) TBDP Take 2,000 Units by mouth.   levothyroxine (SYNTHROID) 25 MCG tablet TAKE ONE TABLET BY MOUTH EVERY MORNING WITH BREAKFAST   melatonin 3 MG TABS tablet Take 3 mg by mouth at bedtime.   Multiple Vitamin (MULTIVITAMIN WITH MINERALS) TABS tablet Take 1 tablet by mouth daily.   Multiple Vitamins-Minerals (PRESERVISION AREDS 2) CAPS Take 1 capsule by mouth 2 (two) times daily.    MYRBETRIQ 25 MG TB24 tablet TAKE ONE TABLET BY MOUTH ONCE DAILY   pantoprazole (PROTONIX) 40 MG tablet TAKE ONE TABLET BY MOUTH ONCE DAILY   senna-docusate (SENOKOT-S) 8.6-50 MG per tablet Take 1 tablet by mouth 2 (two) times daily.   torsemide (DEMADEX) 10 MG tablet Take 10 mg (1 tablet) by mouth daily for 5 days, then take 1 tablet daily as needed for shortness of breath or weight gain 3 lbs or greater overnight.   [DISCONTINUED] citalopram (CELEXA) 20 MG tablet Take 20 mg by mouth daily.     No facility-administered encounter medications on file as of 12/13/2020.    Allergies (verified) Ambien [zolpidem], Codeine, and Lasix [furosemide]   History: Past Medical History:  Diagnosis Date   Aortic insufficiency    a. 03/2017 Echo: Mod AI; b. 02/2019  Echo: Mod AI.   Arthritis    CAD (coronary artery disease)    a. Lexiscan 10/13/14: mid anterior to apical & inf wall ischemia w/ WMA, mild to mod dep EF; b. Cath 10/2014: LM 50ost, LM 99, ost LAD 95, 40p/m, RCA 30p, 58m; b. 10/2014 CABG x 4 (LIMA->LAD, VG->RCA, VG->RI->OM).   Depression    Diastolic dysfunction    a. 10/2014 Echo: EF 55-60%, no RWMA, GR1DD; b. 03/2017 Echo: EF 60-65%, mild LVH, Gr2 DD; c. 02/2019 Echo: EF 55-60%, sev LVH. Diast dysfxn. No rwma. RVSP 41.30mmHg. Mildly dil LA. Mild to mod  MR. Mod TR. Mod AI.   GERD (gastroesophageal reflux disease)    Hyperlipidemia    Hypertension    Hypothyroidism    Macular degeneration    pernicious anemia    Varicose veins    Wears dentures    partial upper   Wears hearing aid    right   Past Surgical History:  Procedure Laterality Date   ANTERIOR VITRECTOMY Right 07/16/2016   Procedure: ANTERIOR VITRECTOMY;  Surgeon: Eulogio Bear, MD;  Location: [facility];  Service: Ophthalmology;  Laterality: Right;   CARDIAC CATHETERIZATION Left 10/14/2014   Procedure: Left Heart Cath and Coronary Angiography;  Surgeon: Wellington Hampshire, MD;  Location: [facility];  Service: Cardiovascular;  Laterality: Left;   CATARACT EXTRACTION W/PHACO Left 04/30/2016   Procedure: CATARACT EXTRACTION PHACO AND INTRAOCULAR LENS PLACEMENT (IOC);  Surgeon: Eulogio Bear, MD;  Location: [facility];  Service: Ophthalmology;  Laterality: Left;  LEFT   CATARACT EXTRACTION W/PHACO Right 07/16/2016   Procedure: CATARACT EXTRACTION PHACO AND INTRAOCULAR LENS PLACEMENT ([place])  Right;  Surgeon: Eulogio Bear, MD;  Location: [facility];  Service: Ophthalmology;  Laterality: Right;   CORONARY ARTERY BYPASS GRAFT N/A 10/14/2014   Procedure: CORONARY ARTERY BYPASS GRAFTING (CABG), ON PUMP, TIMES FOUR, USING LEFT INTERNAL MAMMARY ARTERY, RIGHT GREATER SAPHENOUS VEIN HARVESTED ENDOSCOPICALLY;  Surgeon: Gaye Pollack, MD;  Location: [facility];  Service: Open Heart Surgery;  Laterality: N/A;   CORONARY ARTERY BYPASS GRAFT  10/2014   LIMA-->LAD, SVG-->RCA, sequential SVG-->[place] and OM   HEMORRHOID SURGERY     HIP ARTHROPLASTY Left 01/14/2015   Procedure: ARTHROPLASTY BIPOLAR HIP (HEMIARTHROPLASTY);  Surgeon: Dereck Leep, MD;  Location: [facility];  Service: Orthopedics;  Laterality: Left;   VARICOSE VEIN SURGERY     Family History  Problem Relation Age of Onset   Coronary artery disease Mother    Diabetes Mother    Heart disease  Father    Coronary artery disease Brother    Breast cancer Neg Hx    Social History   Socioeconomic History   Marital status: Widowed    Spouse name: Not on file   Number of children: Not on file   Years of education: Not on file   Highest education level: Not on file  Occupational History   Not on file  Tobacco Use   Smoking status: Never   Smokeless tobacco: Never  Substance and Sexual Activity   Alcohol use: No   Drug use: No   Sexual activity: Never  Other Topics Concern   Not on file  Social History Narrative   ** Merged History Encounter **       Social Determinants of Health   Financial Resource Strain: Low Risk    Difficulty of Paying Living Expenses: Not hard at all  Food Insecurity: No Food Insecurity   Worried About  Running Out of Food in the Last Year: Never true   Ran Out of Food in the Last Year: Never true  Transportation Needs: No Transportation Needs   Lack of Transportation (Medical): No   Lack of Transportation (Non-Medical): No  Physical Activity: Sufficiently Active   Days of Exercise per Week: 5 days   Minutes of Exercise per Session: 30 min  Stress: No Stress Concern Present   Feeling of Stress : Not at all  Social Connections: Moderately Integrated   Frequency of Communication with Friends and Family: More than three times a week   Frequency of Social Gatherings with Friends and Family: More than three times a week   Attends Religious Services: More than 4 times per year   Active Member of [employer] or Organizations: Yes   Attends Archivist Meetings: More than 4 times per year   Marital Status: Widowed    Tobacco Counseling Counseling given: Not Answered   Clinical Intake:  Pre-visit preparation completed: Yes        Diabetes: No  How often do you need to have someone help you when you read instructions, pamphlets, or other written materials from your doctor or pharmacy?: 1 - Never    Interpreter Needed?: No       Activities of Daily Living In your present state of health, do you have any difficulty performing the following activities: 12/13/2020  Hearing? Y  Comment Hearing aids  Vision? N  Difficulty concentrating or making decisions? N  Walking or climbing stairs? Y  Comment Unsteady gait. Walker in use.  Dressing or bathing? N  Doing errands, shopping? Y  Preparing Food and eating ? Y  Comment Meals on wheels and friends supply. Self feeds.  Using the Toilet? N  In the past six months, have you accidently leaked urine? Y  Comment Managed with daily pad  Do you have problems with loss of bowel control? N  Managing your Medications? Y  Comment Daughter assist  Managing your Finances? Y  Comment Daughter assist  Housekeeping or managing your Housekeeping? Y  Comment Maid/family assist  Some recent data might be hidden    Patient Care Team: [name], MD as PCP - General (Internal Medicine) Wellington Hampshire, MD as PCP - Cardiology (Cardiology) [name], MD (Internal Medicine) Hessie Knows, MD as Consulting Physician (Orthopedic Surgery)  Indicate any recent Medical Services you may have received from other than [facility] providers in the past year (date may be approximate).     Assessment:   This is a routine wellness examination for [place].  I connected with Rusty today by telephone and verified that I am speaking with the correct person using two identifiers. Location patient: home Location provider: work Persons participating in the virtual visit: patient, Marine scientist.    I discussed the limitations, risks, security and privacy concerns of performing an evaluation and management service by telephone and the availability of in person appointments. The patient expressed understanding and verbally consented to this telephonic visit.    Interactive audio and video telecommunications were attempted between this provider and patient, however failed, due to patient having  technical difficulties OR patient did not have access to video capability.  We continued and completed visit with audio only.  Some vital signs may be absent or patient reported.   Hearing/Vision screen Hearing Screening - Comments:: Patient is able to hear conversational tones without difficulty.  No issues reported.   Vision Screening - Comments:: Wears corrective  lenses Macular degeneration They have seen their ophthalmologist every 10 weeks.     Dietary issues and exercise activities discussed: Current Exercise Habits: Home exercise routine, Type of exercise: stretching (Post PT exercises. Peddler.), Time (Minutes): 20, Frequency (Times/Week): 4, Weekly Exercise (Minutes/Week): 80, Intensity: Mild Healthy diet Good water intake   Goals Addressed             This Visit's Progress    Follow up with Primary Care Provider       Keep all routine maintenance appointments Follow up as needed        Depression Screen PHQ 2/9 Scores 12/13/2020 08/30/2020 07/28/2020 12/13/2019 12/10/2018 06/17/2018 09/17/2017  PHQ - 2 Score 0 0 0 0 0 0 0    Fall Risk Fall Risk  12/13/2020 08/30/2020 07/28/2020 07/04/2020 01/17/2020  Falls in the past year? 0 1 0 0 0  Number falls in past yr: 0 1 0 0 -  Comment - 2 falls in the last year, last fall 3 weeks ago - - -  Injury with Fall? 0 0 0 0 -  Risk Factor Category  - - - - -  Comment - - - - -  Risk for fall due to : Impaired balance/gait History of fall(s);Medication side effect;Impaired balance/gait;Impaired mobility;Impaired vision - - -  Risk for fall due to: Comment - - - - -  Follow up Falls evaluation completed Falls evaluation completed;Education provided;Falls prevention discussed Falls evaluation completed Falls evaluation completed Falls evaluation completed    FALL RISK PREVENTION PERTAINING TO THE HOME: Handrails in use when climbing stairs? Yes Home free of loose throw rugs in walkways, pet beds, electrical cords, etc? Yes  Adequate  lighting in your home to reduce risk of falls? Yes   ASSISTIVE DEVICES UTILIZED TO PREVENT FALLS: Life alert? Yes  Use of a cane, walker or w/c? Yes  Grab bars in the bathroom? Yes  Shower chair or bench in shower? Yes  Elevated toilet seat or a handicapped toilet? Yes   TIMED UP AND GO:  Was the test performed? No .   Cognitive Function:  Patient is alert and oriented x3.  Enjoys reading and staying active.  Denies difficulty focusing, making decisions.  Memory age appropriate.    6CIT Screen 12/13/2020 12/13/2019 12/10/2018 09/17/2017 09/04/2016  What Year? 0 points 0 points 0 points 0 points 0 points  What month? 0 points 0 points 0 points 0 points 0 points  What time? 0 points - 0 points 0 points 0 points  Count back from 20 0 points - 0 points 0 points 0 points  Months in reverse - 0 points 0 points 0 points 0 points  Repeat phrase - - - 0 points 0 points  Total Score - - - 0 0    Immunizations Immunization History  Administered Date(s) Administered   Influenza Split 02/27/2011, 03/04/2012   Influenza, High Dose Seasonal PF 03/31/2013, 02/24/2018, 02/24/2019   Influenza,inj,Quad PF,6+ Mos 03/30/2014   Influenza-Unspecified 02/06/2016, 03/05/2017, 03/03/2020   PFIZER(Purple Top)SARS-COV-2 Vaccination 06/25/2019, 07/16/2019, 03/03/2020   Pneumococcal Conjugate-13 03/30/2014   Pneumococcal Polysaccharide-23 03/10/2009, 04/19/2015   Tdap 10/06/2013   Zoster Recombinat (Shingrix) 06/25/2018, 12/31/2018   Zoster, Live 03/11/2011   Covid vaccines- 3 completed.   Health Maintenance Health Maintenance  Topic Date Due   COVID-19 Vaccine (4 - Booster for [place] series) 12/29/2020 (Originally 06/03/2020)   INFLUENZA VACCINE  01/01/2021   TETANUS/TDAP  10/07/2023   DEXA SCAN  Completed   PNA  vac Low Risk Adult  Completed   Zoster Vaccines- Shingrix  Completed   HPV VACCINES  Aged Out   Mammogram- deferred.  Hepatitis C Screening: does not qualify  Dental Screening:  Recommended annual dental exams for proper oral hygiene. Wears dentures.   Community Resource Referral / Chronic Care Management: CRR required this visit?  No   CCM required this visit?  No      Plan:   Keep all routine maintenance appointments.   I have personally reviewed and noted the following in the patient's chart:   Medical and social history Use of alcohol, tobacco or illicit drugs  Current medications and supplements including opioid prescriptions. Patient is not currently taking opioid.  Functional ability and status Nutritional status Physical activity Advanced directives List of other physicians Hospitalizations, surgeries, and ER visits in previous 12 months Vitals Screenings to include cognitive, depression, and falls Referrals and appointments  In addition, I have reviewed and discussed with patient certain preventive protocols, quality metrics, and best practice recommendations. A written personalized care plan for preventive services as well as general preventive health recommendations were provided to patient via mychart.     OBrien-Blaney, Lynnann Knudsen L, LPN   [id number]     I have reviewed the above information and agree with above.   Deborra Medina, MD

## 2020-12-13 NOTE — Patient Instructions (Addendum)
Jenna Marshall , Thank you for taking time to come for your Medicare Wellness Visit. I appreciate your ongoing commitment to your health goals. Please review the following plan we discussed and let me know if I can assist you in the future.   These are the goals we discussed:  Goals      Follow up with Primary Care Provider     Keep all routine maintenance appointments Follow up as needed         This is a list of the screening recommended for you and due dates:  Health Maintenance  Topic Date Due   COVID-19 Vaccine (4 - Booster for Pfizer series) 12/29/2020*   Flu Shot  01/01/2021   Tetanus Vaccine  10/07/2023   DEXA scan (bone density measurement)  Completed   Pneumonia vaccines  Completed   Zoster (Shingles) Vaccine  Completed   HPV Vaccine  Aged Out  *Topic was postponed. The date shown is not the original due date.    Advanced directives: on file  Conditions/risks identified: none new  Follow up in one year for your annual wellness visit    Preventive Care 65 Years and Older, Female Preventive care refers to lifestyle choices and visits with your health care provider that can promote health and wellness. What does preventive care include? A yearly physical exam. This is also called an annual well check. Dental exams once or twice a year. Routine eye exams. Ask your health care provider how often you should have your eyes checked. Personal lifestyle choices, including: Daily care of your teeth and gums. Regular physical activity. Eating a healthy diet. Avoiding tobacco and drug use. Limiting alcohol use. Practicing safe sex. Taking low-dose aspirin every day. Taking vitamin and mineral supplements as recommended by your health care provider. What happens during an annual well check? The services and screenings done by your health care provider during your annual well check will depend on your age, overall health, lifestyle risk factors, and family history of  disease. Counseling  Your health care provider may ask you questions about your: Alcohol use. Tobacco use. Drug use. Emotional well-being. Home and relationship well-being. Sexual activity. Eating habits. History of falls. Memory and ability to understand (cognition). Work and work Statistician. Reproductive health. Screening  You may have the following tests or measurements: Height, weight, and BMI. Blood pressure. Lipid and cholesterol levels. These may be checked every 5 years, or more frequently if you are over 67 years old. Skin check. Lung cancer screening. You may have this screening every year starting at age 57 if you have a 30-pack-year history of smoking and currently smoke or have quit within the past 15 years. Fecal occult blood test (FOBT) of the stool. You may have this test every year starting at age 36. Flexible sigmoidoscopy or colonoscopy. You may have a sigmoidoscopy every 5 years or a colonoscopy every 10 years starting at age 70. Hepatitis C blood test. Hepatitis B blood test. Sexually transmitted disease (STD) testing. Diabetes screening. This is done by checking your blood sugar (glucose) after you have not eaten for a while (fasting). You may have this done every 1-3 years. Bone density scan. This is done to screen for osteoporosis. You may have this done starting at age 21. Mammogram. This may be done every 1-2 years. Talk to your health care provider about how often you should have regular mammograms. Talk with your health care provider about your test results, treatment options, and if necessary, the  need for more tests. Vaccines  Your health care provider may recommend certain vaccines, such as: Influenza vaccine. This is recommended every year. Tetanus, diphtheria, and acellular pertussis (Tdap, Td) vaccine. You may need a Td booster every 10 years. Zoster vaccine. You may need this after age 39. Pneumococcal 13-valent conjugate (PCV13) vaccine. One  dose is recommended after age 85. Pneumococcal polysaccharide (PPSV23) vaccine. One dose is recommended after age 65. Talk to your health care provider about which screenings and vaccines you need and how often you need them. This information is not intended to replace advice given to you by your health care provider. Make sure you discuss any questions you have with your health care provider. Document Released: 06/16/2015 Document Revised: 02/07/2016 Document Reviewed: 03/21/2015 Elsevier Interactive Patient Education  2017 [place] Prevention in the Home Falls can cause injuries. They can happen to people of all ages. There are many things you can do to make your home safe and to help prevent falls. What can I do on the outside of my home? Regularly fix the edges of walkways and driveways and fix any cracks. Remove anything that might make you trip as you walk through a door, such as a raised step or threshold. Trim any bushes or trees on the path to your home. Use bright outdoor lighting. Clear any walking paths of anything that might make someone trip, such as rocks or tools. Regularly check to see if handrails are loose or broken. Make sure that both sides of any steps have handrails. Any raised decks and porches should have guardrails on the edges. Have any leaves, snow, or ice cleared regularly. Use sand or salt on walking paths during winter. Clean up any spills in your garage right away. This includes oil or grease spills. What can I do in the bathroom? Use night lights. Install grab bars by the toilet and in the tub and shower. Do not use towel bars as grab bars. Use non-skid mats or decals in the tub or shower. If you need to sit down in the shower, use a plastic, non-slip stool. Keep the floor dry. Clean up any water that spills on the floor as soon as it happens. Remove soap buildup in the tub or shower regularly. Attach bath mats securely with double-sided  non-slip rug tape. Do not have throw rugs and other things on the floor that can make you trip. What can I do in the bedroom? Use night lights. Make sure that you have a light by your bed that is easy to reach. Do not use any sheets or blankets that are too big for your bed. They should not hang down onto the floor. Have a firm chair that has side arms. You can use this for support while you get dressed. Do not have throw rugs and other things on the floor that can make you trip. What can I do in the kitchen? Clean up any spills right away. Avoid walking on wet floors. Keep items that you use a lot in easy-to-reach places. If you need to reach something above you, use a strong step stool that has a grab bar. Keep electrical cords out of the way. Do not use floor polish or wax that makes floors slippery. If you must use wax, use non-skid floor wax. Do not have throw rugs and other things on the floor that can make you trip. What can I do with my stairs? Do not leave any items on the  stairs. Make sure that there are handrails on both sides of the stairs and use them. Fix handrails that are broken or loose. Make sure that handrails are as long as the stairways. Check any carpeting to make sure that it is firmly attached to the stairs. Fix any carpet that is loose or worn. Avoid having throw rugs at the top or bottom of the stairs. If you do have throw rugs, attach them to the floor with carpet tape. Make sure that you have a light switch at the top of the stairs and the bottom of the stairs. If you do not have them, ask someone to add them for you. What else can I do to help prevent falls? Wear shoes that: Do not have high heels. Have rubber bottoms. Are comfortable and fit you well. Are closed at the toe. Do not wear sandals. If you use a stepladder: Make sure that it is fully opened. Do not climb a closed stepladder. Make sure that both sides of the stepladder are locked into place. Ask  someone to hold it for you, if possible. Clearly mark and make sure that you can see: Any grab bars or handrails. First and last steps. Where the edge of each step is. Use tools that help you move around (mobility aids) if they are needed. These include: Canes. Walkers. Scooters. Crutches. Turn on the lights when you go into a dark area. Replace any light bulbs as soon as they burn out. Set up your furniture so you have a clear path. Avoid moving your furniture around. If any of your floors are uneven, fix them. If there are any pets around you, be aware of where they are. Review your medicines with your doctor. Some medicines can make you feel dizzy. This can increase your chance of falling. Ask your doctor what other things that you can do to help prevent falls. This information is not intended to replace advice given to you by your health care provider. Make sure you discuss any questions you have with your health care provider. Document Released: 03/16/2009 Document Revised: 10/26/2015 Document Reviewed: 06/24/2014 Elsevier Interactive Patient Education  2017 [employer].

## 2020-12-21 ENCOUNTER — Other Ambulatory Visit: Payer: Self-pay | Admitting: Cardiovascular Disease

## 2021-01-01 ENCOUNTER — Ambulatory Visit: Payer: Medicare HMO | Admitting: Physician Assistant

## 2021-01-04 ENCOUNTER — Other Ambulatory Visit: Payer: Self-pay

## 2021-01-04 ENCOUNTER — Ambulatory Visit: Payer: Medicare HMO | Admitting: Cardiovascular Disease

## 2021-01-04 ENCOUNTER — Encounter: Payer: Self-pay | Admitting: Cardiovascular Disease

## 2021-01-04 VITALS — BP 200/60 | HR 60 | Ht 62.0 in | Wt 162.2 lb

## 2021-01-04 DIAGNOSIS — E785 Hyperlipidemia, unspecified: Secondary | ICD-10-CM

## 2021-01-04 DIAGNOSIS — I340 Nonrheumatic mitral (valve) insufficiency: Secondary | ICD-10-CM | POA: Diagnosis not present

## 2021-01-04 DIAGNOSIS — I10 Essential (primary) hypertension: Secondary | ICD-10-CM

## 2021-01-04 DIAGNOSIS — I2510 Atherosclerotic heart disease of native coronary artery without angina pectoris: Secondary | ICD-10-CM | POA: Diagnosis not present

## 2021-01-04 DIAGNOSIS — Z79899 Other long term (current) drug therapy: Secondary | ICD-10-CM | POA: Diagnosis not present

## 2021-01-04 DIAGNOSIS — I351 Nonrheumatic aortic (valve) insufficiency: Secondary | ICD-10-CM

## 2021-01-04 MED ORDER — LOSARTAN POTASSIUM 25 MG PO TABS: 25.0000 mg | ORAL_TABLET | Freq: Every day | ORAL | 3 refills | Status: DC

## 2021-01-04 NOTE — Progress Notes (Signed)
Cardiology Office Note   Date:  01/04/2021   ID:  Jenna Marshall, DOB Apr 23, 1926, MRN [id number]  PCP:  Jenna Mc, MD  Cardiologist:   Jenna Sacramento, MD   Chief Complaint  Patient presents with   Other    6 month f/u no complaints today. Meds reviewed verbally with pt.      History of Present Illness: Jenna Marshall is a 87 y.o. female who presents for a follow up visit regarding CAD. She is s/p 4 vessel CABG on 10/14/2014 for severe LM stenosis, diastolic dysfunction, hypertension, and hyperlipidemia . She had a left hip fracture in August, 2016 after a mechanical fall which required surgery.   Most recent echocardiogram in September 2020 showed an EF of 55 to 60% with grade 2 diastolic dysfunction, mild to moderate mitral regurgitation and mildly calcified aortic valve with moderate regurgitation.  She has been doing well with no chest pain, shortness of breath or palpitations.  She walks with a walker and has poor balance overall.  Her blood pressure continues to be elevated but it is usually much lower at home.  She does have whitecoat syndrome.  She denies any neurologic symptoms.  She takes torsemide only as needed based on her weight.  Past Medical History:  Diagnosis Date   Aortic insufficiency    a. 03/2017 Echo: Mod AI; b. 02/2019 Echo: Mod AI.   Arthritis    CAD (coronary artery disease)    a. Lexiscan 10/13/14: mid anterior to apical & inf wall ischemia w/ WMA, mild to mod dep EF; b. Cath 10/2014: LM 50ost, LM 99, ost LAD 95, 40p/m, RCA 30p, 45m b. 10/2014 CABG x 4 (LIMA->LAD, VG->RCA, VG->RI->OM).   Depression    Diastolic dysfunction    a. 10/2014 Echo: EF 55-60%, no RWMA, GR1DD; b. 03/2017 Echo: EF 60-65%, mild LVH, Gr2 DD; c. 02/2019 Echo: EF 55-60%, sev LVH. Diast dysfxn. No rwma. RVSP 41.926mg. Mildly dil LA. Mild to mod MR. Mod TR. Mod AI.   GERD (gastroesophageal reflux disease)    Hyperlipidemia    Hypertension    Hypothyroidism    Macular  degeneration    pernicious anemia    Varicose veins    Wears dentures    partial upper   Wears hearing aid    right    Past Surgical History:  Procedure Laterality Date   ANTERIOR VITRECTOMY Right 07/16/2016   Procedure: ANTERIOR VITRECTOMY;  Surgeon: BrEulogio BearMD;  [place] [facility] Service: Ophthalmology;  Laterality: Right;   CARDIAC CATHETERIZATION Left 10/14/2014   Procedure: Left Heart Cath and Coronary Angiography;  Surgeon: MuWellington HampshireMD;  [facility];  Service: Cardiovascular;  Laterality: Left;   CATARACT EXTRACTION W/PHACO Left 04/30/2016   Procedure: CATARACT EXTRACTION PHACO AND INTRAOCULAR LENS PLACEMENT (IOC);  Surgeon: BrEulogio BearMD;  [place] [facility]: Ophthal[facility];  Laterality: Left;  LEFT   CATARACT EXTRACTION W/PHACO Right 07/16/2016   Procedure: CATARACT EXTRACTION PHACO AND INTRAOCULAR LENS PLACEMENT ([place] Right;  Surgeon: BrEulogio BearMD;  [place] [facility] Service: [place]hthal[facility];  Laterality: Right;   CORONARY ARTERY BYPASS GRAFT N/A 10/14/2014   Procedure: CORONARY ARTERY BYPASS GRAFTING (CABG), ON PUMP, TIMES FOUR, USING LEFT INTERNAL MAMMARY ARTERY, RIGHT GREATER SAPHENOUS VEIN HARVESTED ENDOSCOPICALLY;  Surgeon: BrGaye PollackMD;  [facility] Service: Open Heart Surgery;  Laterality: N/A;   CORONARY ARTERY BYPASS GRAFT  10/2014   LIMA-->LAD, SVG-->RCA, sequential SVG-->Ramus and OM   HEMORRHOID SURGERY     HIP ARTHROPLASTY Left 01/14/2015   Procedure: ARTHROPLASTY BIPOLAR HIP (HEMIARTHROPLASTY);  Surgeon: Dereck Leep, MD;  Location: [facility];  Service: Orthopedics;  Laterality: Left;   VARICOSE VEIN SURGERY       Current Outpatient Medications  Medication Sig Dispense Refill   acetaminophen (TYLENOL) 325 MG tablet Take 2 tablets (650 mg total) by mouth every 6 (six) hours as needed for mild pain.     aspirin 81 MG chewable tablet Chew 1 tablet (81 mg  total) by mouth daily. 30 tablet 0   atorvastatin (LIPITOR) 40 MG tablet TAKE ONE TABLET BY MOUTH ONCE DAILY 30 tablet 0   Calcium Carbonate-Vitamin D 600-200 MG-UNIT TABS Take 1 tablet by mouth 2 (two) times daily.     carvedilol (COREG) 3.125 MG tablet TAKE ONE TABLET BY MOUTH TWICE DAILY WITH A MEAL 180 tablet 1   Cholecalciferol 50 MCG (2000 UT) TBDP Take 2,000 Units by mouth.     levothyroxine (SYNTHROID) 25 MCG tablet TAKE ONE TABLET BY MOUTH EVERY MORNING WITH BREAKFAST 90 tablet 0   melatonin 3 MG TABS tablet Take 3 mg by mouth at bedtime.     Multiple Vitamin (MULTIVITAMIN WITH MINERALS) TABS tablet Take 1 tablet by mouth daily.     Multiple Vitamins-Minerals (PRESERVISION AREDS 2) CAPS Take 1 capsule by mouth 2 (two) times daily.      MYRBETRIQ 25 MG TB24 tablet TAKE ONE TABLET BY MOUTH ONCE DAILY 30 tablet 5   pantoprazole (PROTONIX) 40 MG tablet TAKE ONE TABLET BY MOUTH ONCE DAILY 90 tablet 1   senna-docusate (SENOKOT-S) 8.6-50 MG per tablet Take 1 tablet by mouth 2 (two) times daily. 30 tablet 0   torsemide (DEMADEX) 10 MG tablet Take 10 mg (1 tablet) by mouth daily for 5 days, then take 1 tablet daily as needed for shortness of breath or weight gain 3 lbs or greater overnight. 30 tablet 5   No current facility-administered medications for this visit.    Allergies:   Ambien [zolpidem], Codeine, and Lasix [furosemide]    Social History:  The patient  reports that she has never smoked. She has never used smokeless tobacco. She reports that she does not drink alcohol and does not use drugs.   Family History:  The patient's family history includes Coronary artery disease in her brother and mother; Diabetes in her mother; Heart disease in her father.    ROS:  Please see the history of present illness.   Otherwise, review of systems are positive for none.   All other systems are reviewed and negative.    PHYSICAL EXAM: VS:  BP (!) 200/60 (BP Location: Right Arm, Patient Position:  Sitting, Cuff Size: Normal) Comment: After EKG  Pulse 60   Ht '5\' 2"'$  (1.575 m)   Wt 162 lb 4 oz (73.6 kg)   SpO2 96%   BMI 29.68 kg/m  , BMI Body mass index is 29.68 kg/m. GEN: Well nourished, well developed, in no acute distress  HEENT: normal  Neck: no JVD, carotid bruits, or masses Cardiac: RRR; no  rubs, or gallops,no edema . 2/6 systolic ejection murmur in the aortic area which is mid peaking. Respiratory:  clear to auscultation bilaterally, normal work of breathing GI: soft, nontender, nondistended, + BS MS: no deformity or atrophy  Skin: warm and dry, no rash Neuro:  Strength and sensation are intact Psych: euthymic mood, full  affect   EKG:  EKG is ordered today. The ekg ordered today demonstrates normal sinus rhythm with old septal infarct.     Recent Labs: 07/28/2020: ALT 13; BUN 24; Creat 0.87; Potassium 4.4; Sodium 138; TSH 2.84 11/03/2020: Hemoglobin 12.0; Platelets 149.0    Lipid Panel    Component Value Date/Time   CHOL 132 01/17/2020 1015   CHOL 114 12/23/2014 0826   TRIG 73.0 01/17/2020 1015   HDL 51.60 01/17/2020 1015   HDL 40 12/23/2014 0826   CHOLHDL 3 01/17/2020 1015   VLDL 14.6 01/17/2020 1015   LDLCALC 66 01/17/2020 1015   LDLCALC 56 12/23/2014 0826   LDLDIRECT 104.4 02/27/2011 1135      Wt Readings from Last 3 Encounters:  01/04/21 162 lb 4 oz (73.6 kg)  12/13/20 166 lb (75.3 kg)  08/22/20 166 lb 4 oz (75.4 kg)        ASSESSMENT AND PLAN:  1.  Coronary artery disease involving native coronary arteries without angina: She is doing extremely well with no anginal symptoms. EKG is unremarkable. Continue medical therapy.  2. Essential hypertension: Blood pressure is severely elevated today.  She clearly has a component of whitecoat syndrome but her blood pressure overall has been trending up.  Continue small dose carvedilol.  I elected to add losartan 25 mg once daily.  Check basic metabolic profile in 1 week.    3. Hyperlipidemia: Continue  treatment with atorvastatin. I reviewed most recent labs which showed an LDL of 66 which was at target.  4.  Mild to moderate mitral regurgitation and moderate aortic insufficiency with mildly calcified aortic valve.  Stable symptoms and heart murmur seems to be unchanged.    Disposition:   FU with me in 6 months  Signed,  Jenna Sacramento, MD  01/04/2021 9:41 AM    [place]

## 2021-01-04 NOTE — Patient Instructions (Signed)
Medication Instructions:  -  Your physician has recommended you make the following change in your medication:   1) START losartan 25 mg- take 1 tablet by mouth once daily   *If you need a refill on your cardiac medications before your next appointment, please call your pharmacy*   Lab Work: - Your physician recommends that you return for lab work in: 1 week- [place] Entrance at [facility] 1st desk on the right to check in, past the screening table Lab hours: Monday- Friday (7:30 am- 5:30 pm)   If you have labs (blood work) drawn today and your tests are completely normal, you will receive your results only by: MyChart Message (if you have MyChart) OR A paper copy in the mail If you have any lab test that is abnormal or we need to change your treatment, we will call you to review the results.   Testing/Procedures: - none ordered   Follow-Up: At [facility], you and your health needs are our priority.  As part of our continuing mission to provide you with exceptional heart care, we have created designated Provider Care Teams.  These Care Teams include your primary Cardiologist (physician) and Advanced Practice Providers (APPs -  Physician Assistants and Nurse Practitioners) who all work together to provide you with the care you need, when you need it.  We recommend signing up for the patient portal called "MyChart".  Sign up information is provided on this After Visit Summary.  MyChart is used to connect with patients for Virtual Visits (Telemedicine).  Patients are able to view lab/test results, encounter notes, upcoming appointments, etc.  Non-urgent messages can be sent to your provider as well.   To learn more about what you can do with MyChart, go to [email or web address].    Your next appointment:   6 month(s)  The format for your next appointment:   In Person  Provider:   You may see [name], MD or one of the following Advanced Practice Providers on your  designated Care Team:   Murray Hodgkins, NP Christell Faith, PA-C Marrianne Mood, PA-C Cadence Kathlen Mody, [place]   Other Instructions  Losartan Tablets What is this medication? LOSARTAN (loe SAR tan) treats high blood pressure. It may also be used to prevent a stroke in people with heart disease and high blood pressure. It can be used to prevent kidney damage in people with diabetes. It works by relaxing the blood vessels, which helps decrease the amount of work your heart has todo. It belongs to a group of medications called ARBs. This medicine may be used for other purposes; ask your health care provider orpharmacist if you have questions. COMMON BRAND NAME(S): Cozaar What should I tell my care team before I take this medication? They need to know if you have any of these conditions: Heart failure Kidney disease Liver disease An unusual or allergic reaction to losartan, other medications, foods, dyes, or preservatives Pregnant or trying to get pregnant Breast-feeding How should I use this medication? Take this medication by mouth. Take it as directed on the prescription label at the same time every day. You can take it with or without food. If it upsets your stomach, take it with food. Keep taking it unless your care team tells youto stop. Talk to your care team about the use of this medication in children. While it may be prescribed for children as young as 6 for selected conditions,precautions do apply. Overdosage: If you think you have taken too  much of this medicine contact apoison control center or emergency room at once. NOTE: This medicine is only for you. Do not share this medicine with others. What if I miss a dose? If you miss a dose, take it as soon as you can. If it is almost time for yournext dose, take only that dose. Do not take double or extra doses. What may interact with this medication? Aliskiren ACE inhibitors, like enalapril or lisinopril Diuretics, especially  amiloride, eplerenone, spironolactone, or triamterene Lithium NSAIDs, medications for pain and inflammation, like ibuprofen or naproxen Potassium salts or potassium supplements This list may not describe all possible interactions. Give your health care provider a list of all the medicines, herbs, non-prescription drugs, or dietary supplements you use. Also tell them if you smoke, drink alcohol, or use illegaldrugs. Some items may interact with your medicine. What should I watch for while using this medication? Visit your care team for regular check ups. Check your blood pressure as directed. Ask your care team what your blood pressure should be. Also, find outwhen you should contact them. Do not treat yourself for coughs, colds, or pain while you are using this medication without asking your care team for advice. Some medications mayincrease your blood pressure. Women should inform their care team if they wish to become pregnant or think they might be pregnant. There is a potential for serious side effects to anunborn child. Talk to your care team for more information. You may get drowsy or dizzy. Do not drive, use machinery, or do anything that needs mental alertness until you know how this medication affects you. Do not stand or sit up quickly, especially if you are an older patient. This reduces the risk of dizzy or fainting spells. Alcohol can make you more drowsy anddizzy. Avoid alcoholic drinks. Avoid salt substitutes unless you are told otherwise by your care team. What side effects may I notice from receiving this medication? Side effects that you should report to your care team as soon as possible: Allergic reactions-skin rash, itching, hives, swelling of the face, lips, tongue, or throat High potassium level-muscle weakness, fast or irregular heartbeat Kidney injury-decrease in the amount of urine, swelling of the ankles, hands, or feet Low blood pressure-dizziness, feeling faint or  lightheaded, blurry vision Side effects that usually do not require medical attention (report to your careteam if they continue or are bothersome): Dizziness Headache Runny or stuffy nose This list may not describe all possible side effects. Call your doctor for medical advice about side effects. You may report side effects to FDA at1-800-FDA-1088. Where should I keep my medication? Keep out of the reach of children and pets. Store at room temperature between 20 and 25 degrees C (68 and 77 degrees F). Protect from light. Keep the container tightly closed. Get rid of any unusedmedication after the expiration date. To get rid of medications that are no longer needed or have expired: Take the medication to a medication take-back program. Check with your pharmacy or law enforcement to find a location. If you cannot return the medication, check the label or package insert to see if the medication should be thrown out in the garbage or flushed down the toilet. If you are not sure, ask your care team. If it is safe to put in the trash, empty the medication out of the container. Mix the medication with cat litter, dirt, coffee grounds, or other unwanted substance. Seal the mixture in a bag or container. Put it in the  trash. NOTE: This sheet is a summary. It may not cover all possible information. If you have questions about this medicine, talk to your doctor, pharmacist, orhealth care provider.  2022 Elsevier/Gold Standard (2020-04-12 13:49:17)

## 2021-01-12 ENCOUNTER — Other Ambulatory Visit
Admission: RE | Admit: 2021-01-12 | Discharge: 2021-01-12 | Disposition: A | Discharge status: Home / Self Care | Payer: Medicare HMO | Attending: Cardiovascular Disease | Admitting: Cardiovascular Disease

## 2021-01-12 DIAGNOSIS — Z79899 Other long term (current) drug therapy: Secondary | ICD-10-CM | POA: Diagnosis not present

## 2021-01-12 DIAGNOSIS — I10 Essential (primary) hypertension: Secondary | ICD-10-CM | POA: Insufficient documentation

## 2021-01-12 LAB — BASIC METABOLIC PANEL
Anion gap: 7 (ref 5–15)
BUN: 24 mg/dL — ABNORMAL HIGH (ref 8–23)
CO2: 29 mmol/L (ref 22–32)
Calcium: 9.4 mg/dL (ref 8.9–10.3)
Chloride: 97 mmol/L — ABNORMAL LOW (ref 98–111)
Creatinine, Ser: 0.83 mg/dL (ref 0.44–1.00)
GFR, Estimated: >60 mL/min (ref >60)
Glucose, Bld: 100 mg/dL — ABNORMAL HIGH (ref 70–99)
Potassium: 4.7 mmol/L (ref 3.5–5.1)
Sodium: 133 mmol/L — ABNORMAL LOW (ref 135–145)

## 2021-01-26 ENCOUNTER — Other Ambulatory Visit: Payer: Self-pay | Admitting: Cardiovascular Disease

## 2021-02-14 DIAGNOSIS — H353211 Exudative age-related macular degeneration, right eye, with active choroidal neovascularization: Secondary | ICD-10-CM | POA: Diagnosis not present

## 2021-02-19 ENCOUNTER — Other Ambulatory Visit: Payer: Self-pay | Admitting: Internal Medicine

## 2021-02-21 DIAGNOSIS — H903 Sensorineural hearing loss, bilateral: Secondary | ICD-10-CM | POA: Diagnosis not present

## 2021-02-21 DIAGNOSIS — H6123 Impacted cerumen, bilateral: Secondary | ICD-10-CM | POA: Diagnosis not present

## 2021-03-10 ENCOUNTER — Other Ambulatory Visit: Payer: Self-pay | Admitting: Internal Medicine

## 2021-04-02 DIAGNOSIS — R410 Disorientation, unspecified: Secondary | ICD-10-CM | POA: Diagnosis not present

## 2021-04-02 DIAGNOSIS — S3992XA Unspecified injury of lower back, initial encounter: Secondary | ICD-10-CM | POA: Diagnosis not present

## 2021-04-02 DIAGNOSIS — N390 Urinary tract infection, site not specified: Secondary | ICD-10-CM | POA: Diagnosis not present

## 2021-04-02 DIAGNOSIS — R9431 Abnormal electrocardiogram [ECG] [EKG]: Secondary | ICD-10-CM | POA: Diagnosis not present

## 2021-04-02 DIAGNOSIS — R29810 Facial weakness: Secondary | ICD-10-CM | POA: Diagnosis not present

## 2021-04-02 DIAGNOSIS — M25552 Pain in left hip: Secondary | ICD-10-CM | POA: Diagnosis not present

## 2021-04-02 DIAGNOSIS — R350 Frequency of micturition: Secondary | ICD-10-CM | POA: Diagnosis not present

## 2021-04-02 DIAGNOSIS — G8929 Other chronic pain: Secondary | ICD-10-CM | POA: Diagnosis not present

## 2021-04-02 DIAGNOSIS — B9620 Unspecified Escherichia coli [E. coli] as the cause of diseases classified elsewhere: Secondary | ICD-10-CM | POA: Diagnosis not present

## 2021-04-02 DIAGNOSIS — R4182 Altered mental status, unspecified: Secondary | ICD-10-CM | POA: Diagnosis not present

## 2021-04-02 DIAGNOSIS — Z20822 Contact with and (suspected) exposure to covid-19: Secondary | ICD-10-CM | POA: Diagnosis not present

## 2021-04-02 DIAGNOSIS — W19XXXA Unspecified fall, initial encounter: Secondary | ICD-10-CM | POA: Diagnosis not present

## 2021-04-02 DIAGNOSIS — M47816 Spondylosis without myelopathy or radiculopathy, lumbar region: Secondary | ICD-10-CM | POA: Diagnosis not present

## 2021-04-02 DIAGNOSIS — M549 Dorsalgia, unspecified: Secondary | ICD-10-CM | POA: Diagnosis not present

## 2021-04-02 DIAGNOSIS — I5032 Chronic diastolic (congestive) heart failure: Secondary | ICD-10-CM | POA: Diagnosis not present

## 2021-04-02 DIAGNOSIS — R531 Weakness: Secondary | ICD-10-CM | POA: Diagnosis not present

## 2021-04-02 DIAGNOSIS — I110 Hypertensive heart disease with heart failure: Secondary | ICD-10-CM | POA: Diagnosis not present

## 2021-04-02 DIAGNOSIS — N3000 Acute cystitis without hematuria: Secondary | ICD-10-CM | POA: Diagnosis not present

## 2021-04-02 DIAGNOSIS — M6281 Muscle weakness (generalized): Secondary | ICD-10-CM | POA: Diagnosis not present

## 2021-04-03 DIAGNOSIS — E871 Hypo-osmolality and hyponatremia: Secondary | ICD-10-CM | POA: Diagnosis not present

## 2021-04-03 DIAGNOSIS — I63521 Cerebral infarction due to unspecified occlusion or stenosis of right anterior cerebral artery: Secondary | ICD-10-CM | POA: Diagnosis not present

## 2021-04-03 DIAGNOSIS — R52 Pain, unspecified: Secondary | ICD-10-CM | POA: Diagnosis not present

## 2021-04-03 DIAGNOSIS — R410 Disorientation, unspecified: Secondary | ICD-10-CM | POA: Diagnosis not present

## 2021-04-03 DIAGNOSIS — W19XXXD Unspecified fall, subsequent encounter: Secondary | ICD-10-CM | POA: Diagnosis not present

## 2021-04-03 DIAGNOSIS — N390 Urinary tract infection, site not specified: Secondary | ICD-10-CM | POA: Diagnosis not present

## 2021-04-03 DIAGNOSIS — I6782 Cerebral ischemia: Secondary | ICD-10-CM | POA: Diagnosis not present

## 2021-04-04 DIAGNOSIS — R410 Disorientation, unspecified: Secondary | ICD-10-CM | POA: Diagnosis not present

## 2021-04-04 DIAGNOSIS — W19XXXD Unspecified fall, subsequent encounter: Secondary | ICD-10-CM | POA: Diagnosis not present

## 2021-04-04 DIAGNOSIS — R52 Pain, unspecified: Secondary | ICD-10-CM | POA: Diagnosis not present

## 2021-04-04 DIAGNOSIS — E871 Hypo-osmolality and hyponatremia: Secondary | ICD-10-CM | POA: Diagnosis not present

## 2021-04-04 DIAGNOSIS — N390 Urinary tract infection, site not specified: Secondary | ICD-10-CM | POA: Diagnosis not present

## 2021-04-05 DIAGNOSIS — R410 Disorientation, unspecified: Secondary | ICD-10-CM | POA: Diagnosis not present

## 2021-04-05 DIAGNOSIS — N390 Urinary tract infection, site not specified: Secondary | ICD-10-CM | POA: Diagnosis not present

## 2021-04-05 DIAGNOSIS — W19XXXD Unspecified fall, subsequent encounter: Secondary | ICD-10-CM | POA: Diagnosis not present

## 2021-04-05 DIAGNOSIS — M549 Dorsalgia, unspecified: Secondary | ICD-10-CM | POA: Diagnosis not present

## 2021-04-05 DIAGNOSIS — E871 Hypo-osmolality and hyponatremia: Secondary | ICD-10-CM | POA: Diagnosis not present

## 2021-04-06 DIAGNOSIS — E871 Hypo-osmolality and hyponatremia: Secondary | ICD-10-CM | POA: Diagnosis not present

## 2021-04-06 DIAGNOSIS — W19XXXA Unspecified fall, initial encounter: Secondary | ICD-10-CM | POA: Diagnosis not present

## 2021-04-06 DIAGNOSIS — R410 Disorientation, unspecified: Secondary | ICD-10-CM | POA: Diagnosis not present

## 2021-04-06 DIAGNOSIS — N390 Urinary tract infection, site not specified: Secondary | ICD-10-CM | POA: Diagnosis not present

## 2021-04-06 DIAGNOSIS — M549 Dorsalgia, unspecified: Secondary | ICD-10-CM | POA: Diagnosis not present

## 2021-04-07 DIAGNOSIS — R5381 Other malaise: Secondary | ICD-10-CM | POA: Diagnosis not present

## 2021-04-07 DIAGNOSIS — R410 Disorientation, unspecified: Secondary | ICD-10-CM | POA: Diagnosis not present

## 2021-04-07 DIAGNOSIS — M549 Dorsalgia, unspecified: Secondary | ICD-10-CM | POA: Diagnosis not present

## 2021-04-07 DIAGNOSIS — W19XXXA Unspecified fall, initial encounter: Secondary | ICD-10-CM | POA: Diagnosis not present

## 2021-04-07 DIAGNOSIS — B9620 Unspecified Escherichia coli [E. coli] as the cause of diseases classified elsewhere: Secondary | ICD-10-CM | POA: Diagnosis not present

## 2021-04-07 DIAGNOSIS — N390 Urinary tract infection, site not specified: Secondary | ICD-10-CM | POA: Diagnosis not present

## 2021-04-08 DIAGNOSIS — M549 Dorsalgia, unspecified: Secondary | ICD-10-CM | POA: Diagnosis not present

## 2021-04-08 DIAGNOSIS — R410 Disorientation, unspecified: Secondary | ICD-10-CM | POA: Diagnosis not present

## 2021-04-08 DIAGNOSIS — W19XXXD Unspecified fall, subsequent encounter: Secondary | ICD-10-CM | POA: Diagnosis not present

## 2021-04-08 DIAGNOSIS — B9620 Unspecified Escherichia coli [E. coli] as the cause of diseases classified elsewhere: Secondary | ICD-10-CM | POA: Diagnosis not present

## 2021-04-08 DIAGNOSIS — N390 Urinary tract infection, site not specified: Secondary | ICD-10-CM | POA: Diagnosis not present

## 2021-04-09 ENCOUNTER — Telehealth: Payer: Self-pay | Admitting: Internal Medicine

## 2021-04-09 DIAGNOSIS — R600 Localized edema: Secondary | ICD-10-CM | POA: Diagnosis not present

## 2021-04-09 DIAGNOSIS — R410 Disorientation, unspecified: Secondary | ICD-10-CM | POA: Diagnosis not present

## 2021-04-09 DIAGNOSIS — F5101 Primary insomnia: Secondary | ICD-10-CM | POA: Diagnosis not present

## 2021-04-09 DIAGNOSIS — R3981 Functional urinary incontinence: Secondary | ICD-10-CM | POA: Diagnosis not present

## 2021-04-09 DIAGNOSIS — Z8616 Personal history of COVID-19: Secondary | ICD-10-CM | POA: Diagnosis not present

## 2021-04-09 DIAGNOSIS — B372 Candidiasis of skin and nail: Secondary | ICD-10-CM | POA: Diagnosis not present

## 2021-04-09 DIAGNOSIS — I10 Essential (primary) hypertension: Secondary | ICD-10-CM | POA: Diagnosis not present

## 2021-04-09 DIAGNOSIS — M25562 Pain in left knee: Secondary | ICD-10-CM | POA: Diagnosis not present

## 2021-04-09 DIAGNOSIS — R110 Nausea: Secondary | ICD-10-CM | POA: Diagnosis not present

## 2021-04-09 DIAGNOSIS — N390 Urinary tract infection, site not specified: Secondary | ICD-10-CM | POA: Diagnosis not present

## 2021-04-09 DIAGNOSIS — D72829 Elevated white blood cell count, unspecified: Secondary | ICD-10-CM | POA: Diagnosis not present

## 2021-04-09 DIAGNOSIS — R4182 Altered mental status, unspecified: Secondary | ICD-10-CM | POA: Diagnosis not present

## 2021-04-09 DIAGNOSIS — R2681 Unsteadiness on feet: Secondary | ICD-10-CM | POA: Diagnosis not present

## 2021-04-09 DIAGNOSIS — M6281 Muscle weakness (generalized): Secondary | ICD-10-CM | POA: Diagnosis not present

## 2021-04-09 DIAGNOSIS — W19XXXA Unspecified fall, initial encounter: Secondary | ICD-10-CM | POA: Diagnosis not present

## 2021-04-09 DIAGNOSIS — M2560 Stiffness of unspecified joint, not elsewhere classified: Secondary | ICD-10-CM | POA: Diagnosis not present

## 2021-04-09 DIAGNOSIS — M79605 Pain in left leg: Secondary | ICD-10-CM | POA: Diagnosis not present

## 2021-04-09 DIAGNOSIS — R41841 Cognitive communication deficit: Secondary | ICD-10-CM | POA: Diagnosis not present

## 2021-04-09 DIAGNOSIS — R059 Cough, unspecified: Secondary | ICD-10-CM | POA: Diagnosis not present

## 2021-04-09 DIAGNOSIS — M47816 Spondylosis without myelopathy or radiculopathy, lumbar region: Secondary | ICD-10-CM | POA: Diagnosis not present

## 2021-04-09 DIAGNOSIS — E871 Hypo-osmolality and hyponatremia: Secondary | ICD-10-CM | POA: Diagnosis not present

## 2021-04-09 DIAGNOSIS — R279 Unspecified lack of coordination: Secondary | ICD-10-CM | POA: Diagnosis not present

## 2021-04-09 DIAGNOSIS — I959 Hypotension, unspecified: Secondary | ICD-10-CM | POA: Diagnosis not present

## 2021-04-09 DIAGNOSIS — R278 Other lack of coordination: Secondary | ICD-10-CM | POA: Diagnosis not present

## 2021-04-09 DIAGNOSIS — E039 Hypothyroidism, unspecified: Secondary | ICD-10-CM | POA: Diagnosis not present

## 2021-04-09 DIAGNOSIS — R5381 Other malaise: Secondary | ICD-10-CM | POA: Diagnosis not present

## 2021-04-09 DIAGNOSIS — I5032 Chronic diastolic (congestive) heart failure: Secondary | ICD-10-CM | POA: Diagnosis not present

## 2021-04-09 DIAGNOSIS — R509 Fever, unspecified: Secondary | ICD-10-CM | POA: Diagnosis not present

## 2021-04-09 DIAGNOSIS — M150 Primary generalized (osteo)arthritis: Secondary | ICD-10-CM | POA: Diagnosis not present

## 2021-04-09 DIAGNOSIS — T7849XD Other allergy, subsequent encounter: Secondary | ICD-10-CM | POA: Diagnosis not present

## 2021-04-09 DIAGNOSIS — E785 Hyperlipidemia, unspecified: Secondary | ICD-10-CM | POA: Diagnosis not present

## 2021-04-09 DIAGNOSIS — K219 Gastro-esophageal reflux disease without esophagitis: Secondary | ICD-10-CM | POA: Diagnosis not present

## 2021-04-09 DIAGNOSIS — R54 Age-related physical debility: Secondary | ICD-10-CM | POA: Diagnosis not present

## 2021-04-09 DIAGNOSIS — Z9181 History of falling: Secondary | ICD-10-CM | POA: Diagnosis not present

## 2021-04-09 DIAGNOSIS — R2689 Other abnormalities of gait and mobility: Secondary | ICD-10-CM | POA: Diagnosis not present

## 2021-04-09 DIAGNOSIS — I110 Hypertensive heart disease with heart failure: Secondary | ICD-10-CM | POA: Diagnosis not present

## 2021-04-09 DIAGNOSIS — K5904 Chronic idiopathic constipation: Secondary | ICD-10-CM | POA: Diagnosis not present

## 2021-04-09 DIAGNOSIS — I2510 Atherosclerotic heart disease of native coronary artery without angina pectoris: Secondary | ICD-10-CM | POA: Diagnosis not present

## 2021-04-09 DIAGNOSIS — G4700 Insomnia, unspecified: Secondary | ICD-10-CM | POA: Diagnosis not present

## 2021-04-09 DIAGNOSIS — B9620 Unspecified Escherichia coli [E. coli] as the cause of diseases classified elsewhere: Secondary | ICD-10-CM | POA: Diagnosis not present

## 2021-04-09 DIAGNOSIS — M25552 Pain in left hip: Secondary | ICD-10-CM | POA: Diagnosis not present

## 2021-04-09 DIAGNOSIS — M549 Dorsalgia, unspecified: Secondary | ICD-10-CM | POA: Diagnosis not present

## 2021-04-09 DIAGNOSIS — G8929 Other chronic pain: Secondary | ICD-10-CM | POA: Diagnosis not present

## 2021-04-09 DIAGNOSIS — E782 Mixed hyperlipidemia: Secondary | ICD-10-CM | POA: Diagnosis not present

## 2021-04-09 DIAGNOSIS — U071 COVID-19: Secondary | ICD-10-CM | POA: Diagnosis not present

## 2021-04-09 DIAGNOSIS — Z20822 Contact with and (suspected) exposure to covid-19: Secondary | ICD-10-CM | POA: Diagnosis not present

## 2021-04-09 DIAGNOSIS — Z79899 Other long term (current) drug therapy: Secondary | ICD-10-CM | POA: Diagnosis not present

## 2021-04-09 NOTE — Telephone Encounter (Signed)
Sara Lee a medical school student called on behalf of Jenna Lime, MD. They called in regards to PT being discharged for from [facility] impatient medicine. She was in the hospital from Oct. 31st- Nov. 04th. [name] states she just wanted to let the PCP for pt know.

## 2021-04-10 ENCOUNTER — Telehealth: Payer: Self-pay

## 2021-04-10 DIAGNOSIS — E039 Hypothyroidism, unspecified: Secondary | ICD-10-CM | POA: Diagnosis not present

## 2021-04-10 DIAGNOSIS — E785 Hyperlipidemia, unspecified: Secondary | ICD-10-CM | POA: Diagnosis not present

## 2021-04-10 DIAGNOSIS — E871 Hypo-osmolality and hyponatremia: Secondary | ICD-10-CM | POA: Diagnosis not present

## 2021-04-10 DIAGNOSIS — K5904 Chronic idiopathic constipation: Secondary | ICD-10-CM | POA: Diagnosis not present

## 2021-04-10 DIAGNOSIS — N390 Urinary tract infection, site not specified: Secondary | ICD-10-CM | POA: Diagnosis not present

## 2021-04-10 DIAGNOSIS — M6281 Muscle weakness (generalized): Secondary | ICD-10-CM | POA: Diagnosis not present

## 2021-04-10 DIAGNOSIS — R54 Age-related physical debility: Secondary | ICD-10-CM | POA: Diagnosis not present

## 2021-04-10 DIAGNOSIS — W19XXXA Unspecified fall, initial encounter: Secondary | ICD-10-CM | POA: Diagnosis not present

## 2021-04-10 DIAGNOSIS — I10 Essential (primary) hypertension: Secondary | ICD-10-CM | POA: Diagnosis not present

## 2021-04-10 NOTE — Telephone Encounter (Signed)
Transition Care Management Unsuccessful Follow-up Telephone Call  Date of discharge and from where:  04/06/21-[facility]  Attempts:  1st Attempt  Reason for unsuccessful TCM follow-up call:  Unable to reach patient

## 2021-04-11 DIAGNOSIS — I10 Essential (primary) hypertension: Secondary | ICD-10-CM | POA: Diagnosis not present

## 2021-04-11 DIAGNOSIS — K5904 Chronic idiopathic constipation: Secondary | ICD-10-CM | POA: Diagnosis not present

## 2021-04-11 NOTE — Telephone Encounter (Signed)
Transition Care Management Unsuccessful Follow-up Telephone Call  Date of discharge and from where:  04/06/21-[facility]  Attempts:  2nd Attempt  Reason for unsuccessful TCM follow-up call:  Unable to leave message. No answer. No voicemail.

## 2021-04-12 DIAGNOSIS — E782 Mixed hyperlipidemia: Secondary | ICD-10-CM | POA: Diagnosis not present

## 2021-04-12 DIAGNOSIS — I2510 Atherosclerotic heart disease of native coronary artery without angina pectoris: Secondary | ICD-10-CM | POA: Diagnosis not present

## 2021-04-12 DIAGNOSIS — N390 Urinary tract infection, site not specified: Secondary | ICD-10-CM | POA: Diagnosis not present

## 2021-04-12 DIAGNOSIS — K219 Gastro-esophageal reflux disease without esophagitis: Secondary | ICD-10-CM | POA: Diagnosis not present

## 2021-04-12 DIAGNOSIS — I10 Essential (primary) hypertension: Secondary | ICD-10-CM | POA: Diagnosis not present

## 2021-04-12 DIAGNOSIS — M6281 Muscle weakness (generalized): Secondary | ICD-10-CM | POA: Diagnosis not present

## 2021-04-12 DIAGNOSIS — M150 Primary generalized (osteo)arthritis: Secondary | ICD-10-CM | POA: Diagnosis not present

## 2021-04-12 DIAGNOSIS — E871 Hypo-osmolality and hyponatremia: Secondary | ICD-10-CM | POA: Diagnosis not present

## 2021-04-12 DIAGNOSIS — K5904 Chronic idiopathic constipation: Secondary | ICD-10-CM | POA: Diagnosis not present

## 2021-04-12 NOTE — Telephone Encounter (Signed)
Transition Care Management Unsuccessful Follow-up Telephone Call  Date of discharge and from where:  04/06/21-[facility]  Attempts:  3rd Attempt  Reason for unsuccessful TCM follow-up call:  Unable to leave message. No answer. No voicemail. Unable to schedule TCM at this time. Patient now qualifies for TCM if scheduled for HFU.

## 2021-04-17 DIAGNOSIS — K219 Gastro-esophageal reflux disease without esophagitis: Secondary | ICD-10-CM | POA: Diagnosis not present

## 2021-04-17 DIAGNOSIS — M150 Primary generalized (osteo)arthritis: Secondary | ICD-10-CM | POA: Diagnosis not present

## 2021-04-17 DIAGNOSIS — M6281 Muscle weakness (generalized): Secondary | ICD-10-CM | POA: Diagnosis not present

## 2021-04-17 DIAGNOSIS — I10 Essential (primary) hypertension: Secondary | ICD-10-CM | POA: Diagnosis not present

## 2021-04-17 DIAGNOSIS — E871 Hypo-osmolality and hyponatremia: Secondary | ICD-10-CM | POA: Diagnosis not present

## 2021-04-17 DIAGNOSIS — K5904 Chronic idiopathic constipation: Secondary | ICD-10-CM | POA: Diagnosis not present

## 2021-04-17 DIAGNOSIS — I2510 Atherosclerotic heart disease of native coronary artery without angina pectoris: Secondary | ICD-10-CM | POA: Diagnosis not present

## 2021-04-17 DIAGNOSIS — E782 Mixed hyperlipidemia: Secondary | ICD-10-CM | POA: Diagnosis not present

## 2021-04-17 DIAGNOSIS — N390 Urinary tract infection, site not specified: Secondary | ICD-10-CM | POA: Diagnosis not present

## 2021-04-20 DIAGNOSIS — M150 Primary generalized (osteo)arthritis: Secondary | ICD-10-CM | POA: Diagnosis not present

## 2021-04-20 DIAGNOSIS — M6281 Muscle weakness (generalized): Secondary | ICD-10-CM | POA: Diagnosis not present

## 2021-04-20 DIAGNOSIS — I2510 Atherosclerotic heart disease of native coronary artery without angina pectoris: Secondary | ICD-10-CM | POA: Diagnosis not present

## 2021-04-20 DIAGNOSIS — E782 Mixed hyperlipidemia: Secondary | ICD-10-CM | POA: Diagnosis not present

## 2021-04-20 DIAGNOSIS — N390 Urinary tract infection, site not specified: Secondary | ICD-10-CM | POA: Diagnosis not present

## 2021-04-20 DIAGNOSIS — E871 Hypo-osmolality and hyponatremia: Secondary | ICD-10-CM | POA: Diagnosis not present

## 2021-04-20 DIAGNOSIS — K5904 Chronic idiopathic constipation: Secondary | ICD-10-CM | POA: Diagnosis not present

## 2021-04-20 DIAGNOSIS — I10 Essential (primary) hypertension: Secondary | ICD-10-CM | POA: Diagnosis not present

## 2021-04-20 DIAGNOSIS — K219 Gastro-esophageal reflux disease without esophagitis: Secondary | ICD-10-CM | POA: Diagnosis not present

## 2021-04-23 DIAGNOSIS — M150 Primary generalized (osteo)arthritis: Secondary | ICD-10-CM | POA: Diagnosis not present

## 2021-04-23 DIAGNOSIS — I10 Essential (primary) hypertension: Secondary | ICD-10-CM | POA: Diagnosis not present

## 2021-04-23 DIAGNOSIS — K5904 Chronic idiopathic constipation: Secondary | ICD-10-CM | POA: Diagnosis not present

## 2021-04-23 DIAGNOSIS — K219 Gastro-esophageal reflux disease without esophagitis: Secondary | ICD-10-CM | POA: Diagnosis not present

## 2021-04-23 DIAGNOSIS — N390 Urinary tract infection, site not specified: Secondary | ICD-10-CM | POA: Diagnosis not present

## 2021-04-23 DIAGNOSIS — M6281 Muscle weakness (generalized): Secondary | ICD-10-CM | POA: Diagnosis not present

## 2021-04-23 DIAGNOSIS — G4700 Insomnia, unspecified: Secondary | ICD-10-CM | POA: Diagnosis not present

## 2021-04-23 DIAGNOSIS — I2510 Atherosclerotic heart disease of native coronary artery without angina pectoris: Secondary | ICD-10-CM | POA: Diagnosis not present

## 2021-04-23 DIAGNOSIS — E871 Hypo-osmolality and hyponatremia: Secondary | ICD-10-CM | POA: Diagnosis not present

## 2021-04-24 ENCOUNTER — Other Ambulatory Visit: Payer: Self-pay | Admitting: Cardiovascular Disease

## 2021-04-30 DIAGNOSIS — R059 Cough, unspecified: Secondary | ICD-10-CM | POA: Diagnosis not present

## 2021-04-30 DIAGNOSIS — D72829 Elevated white blood cell count, unspecified: Secondary | ICD-10-CM | POA: Diagnosis not present

## 2021-04-30 DIAGNOSIS — U071 COVID-19: Secondary | ICD-10-CM | POA: Diagnosis not present

## 2021-05-01 DIAGNOSIS — Z79899 Other long term (current) drug therapy: Secondary | ICD-10-CM | POA: Diagnosis not present

## 2021-05-03 DIAGNOSIS — K5904 Chronic idiopathic constipation: Secondary | ICD-10-CM | POA: Diagnosis not present

## 2021-05-03 DIAGNOSIS — I10 Essential (primary) hypertension: Secondary | ICD-10-CM | POA: Diagnosis not present

## 2021-05-03 DIAGNOSIS — K219 Gastro-esophageal reflux disease without esophagitis: Secondary | ICD-10-CM | POA: Diagnosis not present

## 2021-05-03 DIAGNOSIS — U071 COVID-19: Secondary | ICD-10-CM | POA: Diagnosis not present

## 2021-05-03 DIAGNOSIS — M6281 Muscle weakness (generalized): Secondary | ICD-10-CM | POA: Diagnosis not present

## 2021-05-03 DIAGNOSIS — G4700 Insomnia, unspecified: Secondary | ICD-10-CM | POA: Diagnosis not present

## 2021-05-03 DIAGNOSIS — I2510 Atherosclerotic heart disease of native coronary artery without angina pectoris: Secondary | ICD-10-CM | POA: Diagnosis not present

## 2021-05-07 DIAGNOSIS — E871 Hypo-osmolality and hyponatremia: Secondary | ICD-10-CM | POA: Diagnosis not present

## 2021-05-07 DIAGNOSIS — F5101 Primary insomnia: Secondary | ICD-10-CM | POA: Diagnosis not present

## 2021-05-08 DIAGNOSIS — I10 Essential (primary) hypertension: Secondary | ICD-10-CM | POA: Diagnosis not present

## 2021-05-14 DIAGNOSIS — B372 Candidiasis of skin and nail: Secondary | ICD-10-CM | POA: Diagnosis not present

## 2021-05-14 DIAGNOSIS — M6281 Muscle weakness (generalized): Secondary | ICD-10-CM | POA: Diagnosis not present

## 2021-05-14 DIAGNOSIS — I10 Essential (primary) hypertension: Secondary | ICD-10-CM | POA: Diagnosis not present

## 2021-05-14 DIAGNOSIS — E871 Hypo-osmolality and hyponatremia: Secondary | ICD-10-CM | POA: Diagnosis not present

## 2021-05-14 DIAGNOSIS — Z8616 Personal history of COVID-19: Secondary | ICD-10-CM | POA: Diagnosis not present

## 2021-05-14 DIAGNOSIS — R600 Localized edema: Secondary | ICD-10-CM | POA: Diagnosis not present

## 2021-05-14 DIAGNOSIS — N390 Urinary tract infection, site not specified: Secondary | ICD-10-CM | POA: Diagnosis not present

## 2021-05-14 DIAGNOSIS — M79605 Pain in left leg: Secondary | ICD-10-CM | POA: Diagnosis not present

## 2021-05-17 DIAGNOSIS — T7849XD Other allergy, subsequent encounter: Secondary | ICD-10-CM | POA: Diagnosis not present

## 2021-05-18 DIAGNOSIS — R600 Localized edema: Secondary | ICD-10-CM | POA: Diagnosis not present

## 2021-05-25 DIAGNOSIS — R110 Nausea: Secondary | ICD-10-CM | POA: Diagnosis not present

## 2021-05-25 DIAGNOSIS — R4182 Altered mental status, unspecified: Secondary | ICD-10-CM | POA: Diagnosis not present

## 2021-05-25 DIAGNOSIS — I959 Hypotension, unspecified: Secondary | ICD-10-CM | POA: Diagnosis not present

## 2021-05-28 DIAGNOSIS — N390 Urinary tract infection, site not specified: Secondary | ICD-10-CM | POA: Diagnosis not present

## 2021-05-28 DIAGNOSIS — M6281 Muscle weakness (generalized): Secondary | ICD-10-CM | POA: Diagnosis not present

## 2021-05-28 DIAGNOSIS — M25562 Pain in left knee: Secondary | ICD-10-CM | POA: Diagnosis not present

## 2021-05-28 DIAGNOSIS — R2681 Unsteadiness on feet: Secondary | ICD-10-CM | POA: Diagnosis not present

## 2021-05-28 DIAGNOSIS — R3981 Functional urinary incontinence: Secondary | ICD-10-CM | POA: Diagnosis not present

## 2021-05-31 DIAGNOSIS — I10 Essential (primary) hypertension: Secondary | ICD-10-CM | POA: Diagnosis not present

## 2021-05-31 DIAGNOSIS — M25562 Pain in left knee: Secondary | ICD-10-CM | POA: Diagnosis not present

## 2021-05-31 DIAGNOSIS — R2681 Unsteadiness on feet: Secondary | ICD-10-CM | POA: Diagnosis not present

## 2021-05-31 DIAGNOSIS — N390 Urinary tract infection, site not specified: Secondary | ICD-10-CM | POA: Diagnosis not present

## 2021-05-31 DIAGNOSIS — M6281 Muscle weakness (generalized): Secondary | ICD-10-CM | POA: Diagnosis not present

## 2021-05-31 DIAGNOSIS — R3981 Functional urinary incontinence: Secondary | ICD-10-CM | POA: Diagnosis not present

## 2021-06-04 DIAGNOSIS — R110 Nausea: Secondary | ICD-10-CM | POA: Diagnosis not present

## 2021-06-04 DIAGNOSIS — N390 Urinary tract infection, site not specified: Secondary | ICD-10-CM | POA: Diagnosis not present

## 2021-06-04 DIAGNOSIS — R3981 Functional urinary incontinence: Secondary | ICD-10-CM | POA: Diagnosis not present

## 2021-06-04 DIAGNOSIS — M25562 Pain in left knee: Secondary | ICD-10-CM | POA: Diagnosis not present

## 2021-06-04 DIAGNOSIS — M6281 Muscle weakness (generalized): Secondary | ICD-10-CM | POA: Diagnosis not present

## 2021-06-04 DIAGNOSIS — R2681 Unsteadiness on feet: Secondary | ICD-10-CM | POA: Diagnosis not present

## 2021-06-07 DIAGNOSIS — M6281 Muscle weakness (generalized): Secondary | ICD-10-CM | POA: Diagnosis not present

## 2021-06-07 DIAGNOSIS — R2681 Unsteadiness on feet: Secondary | ICD-10-CM | POA: Diagnosis not present

## 2021-06-07 DIAGNOSIS — M25562 Pain in left knee: Secondary | ICD-10-CM | POA: Diagnosis not present

## 2021-06-07 DIAGNOSIS — R110 Nausea: Secondary | ICD-10-CM | POA: Diagnosis not present

## 2021-06-07 DIAGNOSIS — R3981 Functional urinary incontinence: Secondary | ICD-10-CM | POA: Diagnosis not present

## 2021-06-07 DIAGNOSIS — N390 Urinary tract infection, site not specified: Secondary | ICD-10-CM | POA: Diagnosis not present

## 2021-06-11 DIAGNOSIS — M25562 Pain in left knee: Secondary | ICD-10-CM | POA: Diagnosis not present

## 2021-06-11 DIAGNOSIS — N390 Urinary tract infection, site not specified: Secondary | ICD-10-CM | POA: Diagnosis not present

## 2021-06-11 DIAGNOSIS — K5904 Chronic idiopathic constipation: Secondary | ICD-10-CM | POA: Diagnosis not present

## 2021-06-11 DIAGNOSIS — R2681 Unsteadiness on feet: Secondary | ICD-10-CM | POA: Diagnosis not present

## 2021-06-11 DIAGNOSIS — E782 Mixed hyperlipidemia: Secondary | ICD-10-CM | POA: Diagnosis not present

## 2021-06-11 DIAGNOSIS — I10 Essential (primary) hypertension: Secondary | ICD-10-CM | POA: Diagnosis not present

## 2021-06-11 DIAGNOSIS — R110 Nausea: Secondary | ICD-10-CM | POA: Diagnosis not present

## 2021-06-11 DIAGNOSIS — E871 Hypo-osmolality and hyponatremia: Secondary | ICD-10-CM | POA: Diagnosis not present

## 2021-06-11 DIAGNOSIS — B372 Candidiasis of skin and nail: Secondary | ICD-10-CM | POA: Diagnosis not present

## 2021-06-11 DIAGNOSIS — Z8616 Personal history of COVID-19: Secondary | ICD-10-CM | POA: Diagnosis not present

## 2021-06-11 DIAGNOSIS — M6281 Muscle weakness (generalized): Secondary | ICD-10-CM | POA: Diagnosis not present

## 2021-06-11 DIAGNOSIS — M79605 Pain in left leg: Secondary | ICD-10-CM | POA: Diagnosis not present

## 2021-06-11 DIAGNOSIS — R3981 Functional urinary incontinence: Secondary | ICD-10-CM | POA: Diagnosis not present

## 2021-06-14 DIAGNOSIS — R110 Nausea: Secondary | ICD-10-CM | POA: Diagnosis not present

## 2021-06-14 DIAGNOSIS — R2681 Unsteadiness on feet: Secondary | ICD-10-CM | POA: Diagnosis not present

## 2021-06-14 DIAGNOSIS — N390 Urinary tract infection, site not specified: Secondary | ICD-10-CM | POA: Diagnosis not present

## 2021-06-14 DIAGNOSIS — M25562 Pain in left knee: Secondary | ICD-10-CM | POA: Diagnosis not present

## 2021-06-14 DIAGNOSIS — R3981 Functional urinary incontinence: Secondary | ICD-10-CM | POA: Diagnosis not present

## 2021-06-14 DIAGNOSIS — M6281 Muscle weakness (generalized): Secondary | ICD-10-CM | POA: Diagnosis not present

## 2021-06-21 DIAGNOSIS — R2681 Unsteadiness on feet: Secondary | ICD-10-CM | POA: Diagnosis not present

## 2021-06-21 DIAGNOSIS — R3981 Functional urinary incontinence: Secondary | ICD-10-CM | POA: Diagnosis not present

## 2021-06-21 DIAGNOSIS — F5101 Primary insomnia: Secondary | ICD-10-CM | POA: Diagnosis not present

## 2021-06-21 DIAGNOSIS — I10 Essential (primary) hypertension: Secondary | ICD-10-CM | POA: Diagnosis not present

## 2021-06-21 DIAGNOSIS — N390 Urinary tract infection, site not specified: Secondary | ICD-10-CM | POA: Diagnosis not present

## 2021-06-21 DIAGNOSIS — Z8616 Personal history of COVID-19: Secondary | ICD-10-CM | POA: Diagnosis not present

## 2021-06-21 DIAGNOSIS — M6281 Muscle weakness (generalized): Secondary | ICD-10-CM | POA: Diagnosis not present

## 2021-06-21 DIAGNOSIS — R110 Nausea: Secondary | ICD-10-CM | POA: Diagnosis not present

## 2021-06-21 DIAGNOSIS — M25562 Pain in left knee: Secondary | ICD-10-CM | POA: Diagnosis not present

## 2021-06-28 DIAGNOSIS — R2681 Unsteadiness on feet: Secondary | ICD-10-CM | POA: Diagnosis not present

## 2021-06-28 DIAGNOSIS — N390 Urinary tract infection, site not specified: Secondary | ICD-10-CM | POA: Diagnosis not present

## 2021-06-28 DIAGNOSIS — R110 Nausea: Secondary | ICD-10-CM | POA: Diagnosis not present

## 2021-06-28 DIAGNOSIS — M25562 Pain in left knee: Secondary | ICD-10-CM | POA: Diagnosis not present

## 2021-06-28 DIAGNOSIS — R3981 Functional urinary incontinence: Secondary | ICD-10-CM | POA: Diagnosis not present

## 2021-06-28 DIAGNOSIS — M6281 Muscle weakness (generalized): Secondary | ICD-10-CM | POA: Diagnosis not present

## 2021-07-03 DIAGNOSIS — R2681 Unsteadiness on feet: Secondary | ICD-10-CM | POA: Diagnosis not present

## 2021-07-03 DIAGNOSIS — M2560 Stiffness of unspecified joint, not elsewhere classified: Secondary | ICD-10-CM | POA: Diagnosis not present

## 2021-07-03 DIAGNOSIS — Z9181 History of falling: Secondary | ICD-10-CM | POA: Diagnosis not present

## 2021-07-03 DIAGNOSIS — I10 Essential (primary) hypertension: Secondary | ICD-10-CM | POA: Diagnosis not present

## 2021-07-03 DIAGNOSIS — M6281 Muscle weakness (generalized): Secondary | ICD-10-CM | POA: Diagnosis not present

## 2021-07-03 DIAGNOSIS — R278 Other lack of coordination: Secondary | ICD-10-CM | POA: Diagnosis not present

## 2021-07-03 DIAGNOSIS — R2689 Other abnormalities of gait and mobility: Secondary | ICD-10-CM | POA: Diagnosis not present

## 2021-07-04 DIAGNOSIS — M6281 Muscle weakness (generalized): Secondary | ICD-10-CM | POA: Diagnosis not present

## 2021-07-04 DIAGNOSIS — H353211 Exudative age-related macular degeneration, right eye, with active choroidal neovascularization: Secondary | ICD-10-CM | POA: Diagnosis not present

## 2021-07-04 DIAGNOSIS — E871 Hypo-osmolality and hyponatremia: Secondary | ICD-10-CM | POA: Diagnosis not present

## 2021-07-04 DIAGNOSIS — K5904 Chronic idiopathic constipation: Secondary | ICD-10-CM | POA: Diagnosis not present

## 2021-07-04 DIAGNOSIS — I10 Essential (primary) hypertension: Secondary | ICD-10-CM | POA: Diagnosis not present

## 2021-07-04 DIAGNOSIS — E782 Mixed hyperlipidemia: Secondary | ICD-10-CM | POA: Diagnosis not present

## 2021-07-05 DIAGNOSIS — M6281 Muscle weakness (generalized): Secondary | ICD-10-CM | POA: Diagnosis not present

## 2021-07-05 DIAGNOSIS — M25562 Pain in left knee: Secondary | ICD-10-CM | POA: Diagnosis not present

## 2021-07-05 DIAGNOSIS — R278 Other lack of coordination: Secondary | ICD-10-CM | POA: Diagnosis not present

## 2021-07-05 DIAGNOSIS — R2681 Unsteadiness on feet: Secondary | ICD-10-CM | POA: Diagnosis not present

## 2021-07-05 DIAGNOSIS — R110 Nausea: Secondary | ICD-10-CM | POA: Diagnosis not present

## 2021-07-05 DIAGNOSIS — N390 Urinary tract infection, site not specified: Secondary | ICD-10-CM | POA: Diagnosis not present

## 2021-07-05 DIAGNOSIS — R3981 Functional urinary incontinence: Secondary | ICD-10-CM | POA: Diagnosis not present

## 2021-07-05 DIAGNOSIS — I10 Essential (primary) hypertension: Secondary | ICD-10-CM | POA: Diagnosis not present

## 2021-07-12 ENCOUNTER — Ambulatory Visit: Payer: Medicare HMO | Admitting: Cardiovascular Disease

## 2021-07-18 ENCOUNTER — Telehealth: Payer: Self-pay | Admitting: Internal Medicine

## 2021-07-18 NOTE — Telephone Encounter (Signed)
Maggie from [facility] called asking for orders for Nursing 1 time a week for 5 weeks for patient Jenna Marshall. She can be reached at [phone]. She faxed over paperwork if you do not have it you can leave a verbal message

## 2021-07-19 NOTE — Telephone Encounter (Signed)
Verbal orders given for nursing and PT.

## 2021-07-23 DIAGNOSIS — G4700 Insomnia, unspecified: Secondary | ICD-10-CM

## 2021-07-23 DIAGNOSIS — D509 Iron deficiency anemia, unspecified: Secondary | ICD-10-CM | POA: Diagnosis not present

## 2021-07-23 DIAGNOSIS — Z86718 Personal history of other venous thrombosis and embolism: Secondary | ICD-10-CM

## 2021-07-23 DIAGNOSIS — R32 Unspecified urinary incontinence: Secondary | ICD-10-CM | POA: Diagnosis not present

## 2021-07-23 DIAGNOSIS — Z6830 Body mass index (BMI) 30.0-30.9, adult: Secondary | ICD-10-CM

## 2021-07-23 DIAGNOSIS — E785 Hyperlipidemia, unspecified: Secondary | ICD-10-CM | POA: Diagnosis not present

## 2021-07-23 DIAGNOSIS — I10 Essential (primary) hypertension: Secondary | ICD-10-CM | POA: Diagnosis not present

## 2021-07-23 DIAGNOSIS — Z8616 Personal history of COVID-19: Secondary | ICD-10-CM

## 2021-07-23 DIAGNOSIS — E039 Hypothyroidism, unspecified: Secondary | ICD-10-CM | POA: Diagnosis not present

## 2021-07-23 DIAGNOSIS — E669 Obesity, unspecified: Secondary | ICD-10-CM | POA: Diagnosis not present

## 2021-07-23 DIAGNOSIS — Z9181 History of falling: Secondary | ICD-10-CM

## 2021-07-23 DIAGNOSIS — K5900 Constipation, unspecified: Secondary | ICD-10-CM | POA: Diagnosis not present

## 2021-07-23 DIAGNOSIS — K219 Gastro-esophageal reflux disease without esophagitis: Secondary | ICD-10-CM

## 2021-07-23 DIAGNOSIS — H3530 Unspecified macular degeneration: Secondary | ICD-10-CM | POA: Diagnosis not present

## 2021-07-23 DIAGNOSIS — I2510 Atherosclerotic heart disease of native coronary artery without angina pectoris: Secondary | ICD-10-CM

## 2021-07-23 DIAGNOSIS — N390 Urinary tract infection, site not specified: Secondary | ICD-10-CM | POA: Diagnosis not present

## 2021-07-23 DIAGNOSIS — Z7982 Long term (current) use of aspirin: Secondary | ICD-10-CM

## 2021-07-26 ENCOUNTER — Other Ambulatory Visit: Payer: Self-pay

## 2021-07-26 ENCOUNTER — Ambulatory Visit (INDEPENDENT_AMBULATORY_CARE_PROVIDER_SITE_OTHER): Payer: Medicare HMO | Admitting: Internal Medicine

## 2021-07-26 ENCOUNTER — Encounter: Payer: Self-pay | Admitting: Internal Medicine

## 2021-07-26 VITALS — BP 140/92 | HR 72 | Temp 98.1°F | Ht 62.0 in | Wt 169.0 lb

## 2021-07-26 DIAGNOSIS — N390 Urinary tract infection, site not specified: Secondary | ICD-10-CM | POA: Diagnosis not present

## 2021-07-26 DIAGNOSIS — D508 Other iron deficiency anemias: Secondary | ICD-10-CM

## 2021-07-26 DIAGNOSIS — I10 Essential (primary) hypertension: Secondary | ICD-10-CM | POA: Diagnosis not present

## 2021-07-26 DIAGNOSIS — E039 Hypothyroidism, unspecified: Secondary | ICD-10-CM | POA: Diagnosis not present

## 2021-07-26 DIAGNOSIS — N3941 Urge incontinence: Secondary | ICD-10-CM | POA: Diagnosis not present

## 2021-07-26 DIAGNOSIS — R531 Weakness: Secondary | ICD-10-CM | POA: Diagnosis not present

## 2021-07-26 DIAGNOSIS — R351 Nocturia: Secondary | ICD-10-CM

## 2021-07-26 DIAGNOSIS — E785 Hyperlipidemia, unspecified: Secondary | ICD-10-CM

## 2021-07-26 DIAGNOSIS — Z79899 Other long term (current) drug therapy: Secondary | ICD-10-CM | POA: Diagnosis not present

## 2021-07-26 LAB — CBC WITH DIFFERENTIAL/PLATELET
Basophils Absolute: 0.1 10*3/uL (ref 0.0–0.1)
Basophils Relative: 1.2 % (ref 0.0–3.0)
Eosinophils Absolute: 0.2 10*3/uL (ref 0.0–0.7)
Eosinophils Relative: 2.1 % (ref 0.0–5.0)
HCT: 35.9 % — ABNORMAL LOW (ref 36.0–46.0)
Hemoglobin: 12 g/dL (ref 12.0–15.0)
Lymphocytes Relative: 11.2 % — ABNORMAL LOW (ref 12.0–46.0)
Lymphs Abs: 0.9 10*3/uL (ref 0.7–4.0)
MCHC: 33.6 g/dL (ref 30.0–36.0)
MCV: 96 fl (ref 78.0–100.0)
Monocytes Absolute: 0.7 10*3/uL (ref 0.1–1.0)
Monocytes Relative: 8.6 % (ref 3.0–12.0)
Neutro Abs: 6.3 10*3/uL (ref 1.4–7.7)
Neutrophils Relative %: 76.9 % (ref 43.0–77.0)
Platelets: 179 10*3/uL (ref 150.0–400.0)
RBC: 3.74 Mil/uL — ABNORMAL LOW (ref 3.87–5.11)
RDW: 17.3 % — ABNORMAL HIGH (ref 11.5–15.5)
WBC: 8.3 10*3/uL (ref 4.0–10.5)

## 2021-07-26 LAB — COMPREHENSIVE METABOLIC PANEL
ALT: 13 U/L (ref 0–35)
AST: 19 U/L (ref 0–37)
Albumin: 4.2 g/dL (ref 3.5–5.2)
Alkaline Phosphatase: 61 U/L (ref 39–117)
BUN: 20 mg/dL (ref 6–23)
CO2: 32 mEq/L (ref 19–32)
Calcium: 9.6 mg/dL (ref 8.4–10.5)
Chloride: 94 mEq/L — ABNORMAL LOW (ref 96–112)
Creatinine, Ser: 0.84 mg/dL (ref 0.40–1.20)
GFR: 59.82 mL/min — ABNORMAL LOW (ref >60.00)
Glucose, Bld: 93 mg/dL (ref 70–99)
Potassium: 4.6 mEq/L (ref 3.5–5.1)
Sodium: 131 mEq/L — ABNORMAL LOW (ref 135–145)
Total Bilirubin: 0.7 mg/dL (ref 0.2–1.2)
Total Protein: 6.7 g/dL (ref 6.0–8.3)

## 2021-07-26 LAB — URINALYSIS, ROUTINE W REFLEX MICROSCOPIC
Bilirubin Urine: NEGATIVE
Hgb urine dipstick: NEGATIVE
Ketones, ur: 15 — AB
Nitrite: POSITIVE — AB
RBC / HPF: NONE SEEN (ref 0–?)
Specific Gravity, Urine: 1.025 (ref 1.000–1.030)
Total Protein, Urine: NEGATIVE
Urine Glucose: NEGATIVE
Urobilinogen, UA: 0.2 (ref 0.0–1.0)
pH: 5.5 (ref 5.0–8.0)

## 2021-07-26 LAB — TSH: TSH: 4.08 u[IU]/mL (ref 0.35–5.50)

## 2021-07-26 LAB — MAGNESIUM: Magnesium: 2 mg/dL (ref 1.5–2.5)

## 2021-07-26 LAB — IBC + FERRITIN
Ferritin: 54.6 ng/mL (ref 10.0–291.0)
Iron: 87 ug/dL (ref 42–145)
Saturation Ratios: 35.9 % (ref 20.0–50.0)
TIBC: 242.2 ug/dL — ABNORMAL LOW (ref 250.0–450.0)
Transferrin: 173 mg/dL — ABNORMAL LOW (ref 212.0–360.0)

## 2021-07-26 LAB — LIPID PANEL
Cholesterol: 135 mg/dL (ref 0–200)
HDL: 55.6 mg/dL (ref >39.00)
LDL Cholesterol: 62 mg/dL (ref 0–99)
NonHDL: 79.54
Total CHOL/HDL Ratio: 2
Triglycerides: 88 mg/dL (ref 0.0–149.0)
VLDL: 17.6 mg/dL (ref 0.0–40.0)

## 2021-07-26 NOTE — Progress Notes (Signed)
Subjective:  Patient ID: Jenna Marshall, female    DOB: 15-Apr-1926  Age: 88 y.o. MRN: [id number]  CC: The primary encounter diagnosis was Hyperlipidemia, unspecified hyperlipidemia type. Diagnoses of Acquired hypothyroidism, Other iron deficiency anemia, Long-term use of high-risk medication, Recurrent UTI, Urinary incontinence, urge, Nocturia more than twice per night, General weakness, and Essential hypertension were also pertinent to this visit.   This visit occurred during the SARS-CoV-2 public health emergency.  Safety protocols were in place, including screening questions prior to the visit, additional usage of staff PPE, and extensive cleaning of exam room while observing appropriate contact time as indicated for disinfecting solutions.    HPI Jenna Marshall presents for follow up on chronic issues  Chief Complaint  Patient presents with   Follow-up    Follow up after rehad stay. Patient has been home for 3 weeks.   She was hospitalized at [facility] from Oct 31 to Nov 7 with weakness , lethargy , left eye droop,  and back pain eventually attributed to UTI after CT head/ spine and MRI brain were negative for acute changes.  She was treated for E Coli UTI with cefdiniri and discharged to [facility] for rehab.  She has been home from [facility] 3 weeks.  Reviewed some hiccups during rehab at [facility]: she developed diaper dermatitis due to prolonged contact with urine in diaper,   and her  thyroid medication was given repeatedly with her breakfast.  The diet included daily servings of fried food,  breaded,  and highly seasoned. She received PT daily except during her 7 day quarantine  during a mild , nearly asymptomatic COVID infection  which was found during routine testing  during transition from one room to another room .  She is not sure if she received antiviral.   She has returned to independent home with 24/7 supervision.  She is Using a rolling walker for the first day since home.  Her Left leg still weak . Getting home health  since dicharge from [facility],  and PT starting today .  Sees arida next month,  tolerating lisinopril which was started for elevated blood pressure .  Discharge summary reviewed;  lisinopril ont mention,  only losartan and carvedilol (both continued from pre admission)  Urinary incontinence ::  no significant change with myrbetriq       Outpatient Medications Prior to Visit  Medication Sig Dispense Refill   acetaminophen (TYLENOL) 325 MG tablet Take 2 tablets (650 mg total) by mouth every 6 (six) hours as needed for mild pain.     aspirin 81 MG chewable tablet Chew 1 tablet (81 mg total) by mouth daily. 30 tablet 0   atorvastatin (LIPITOR) 40 MG tablet TAKE ONE TABLET BY MOUTH ONCE DAILY 30 tablet 3   Calcium Carbonate-Vitamin D 600-200 MG-UNIT TABS Take 1 tablet by mouth 2 (two) times daily.     carvedilol (COREG) 3.125 MG tablet TAKE ONE TABLET BY MOUTH TWICE DAILY WITH A MEAL 180 tablet 1   Cholecalciferol 50 MCG (2000 UT) TBDP Take 2,000 Units by mouth.     levothyroxine (SYNTHROID) 25 MCG tablet TAKE ONE TABLET BY MOUTH EVERY MORNING TAKE WITH BREAKFAST 90 tablet 0   Multiple Vitamin (MULTIVITAMIN WITH MINERALS) TABS tablet Take 1 tablet by mouth daily.     Multiple Vitamins-Minerals (PRESERVISION AREDS 2) CAPS Take 1 capsule by mouth 2 (two) times daily.      MYRBETRIQ 25 MG TB24 tablet TAKE ONE  TABLET BY MOUTH ONCE DAILY 30 tablet 5   pantoprazole (PROTONIX) 40 MG tablet TAKE ONE TABLET BY MOUTH ONCE DAILY 90 tablet 1   senna-docusate (SENOKOT-S) 8.6-50 MG per tablet Take 1 tablet by mouth 2 (two) times daily. 30 tablet 0   torsemide (DEMADEX) 10 MG tablet Take 10 mg (1 tablet) by mouth daily for 5 days, then take 1 tablet daily as needed for shortness of breath or weight gain 3 lbs or greater overnight. 30 tablet 5   losartan (COZAAR) 25 MG tablet Take 1 tablet (25 mg total) by mouth daily. 90 tablet 3   melatonin 3 MG TABS tablet  Take 3 mg by mouth at bedtime.     No facility-administered medications prior to visit.    Review of Systems;  Patient denies headache, fevers, malaise, unintentional weight loss, skin rash, eye pain, sinus congestion and sinus pain, sore throat, dysphagia,  hemoptysis , cough, dyspnea, wheezing, chest pain, palpitations, orthopnea, edema, abdominal pain, nausea, melena, diarrhea, constipation, flank pain, dysuria, hematuria, urinary  Frequency, nocturia, numbness, tingling, seizures,  Focal weakness, Loss of consciousness,  Tremor, insomnia, depression, anxiety, and suicidal ideation.      Objective:  BP (!) 140/92 (BP Location: Left Arm, Patient Position: Sitting, Cuff Size: Small)    Pulse 72    Temp 98.1 F (36.7 C) (Oral)    Ht 5\' 2"  (1.575 m)    Wt 169 lb (76.7 kg)    SpO2 94%    BMI 30.91 kg/m   BP Readings from Last 3 Encounters:  07/26/21 (!) 140/92  01/04/21 (!) 200/60  08/22/20 (!) 180/70    Wt Readings from Last 3 Encounters:  07/26/21 169 lb (76.7 kg)  01/04/21 162 lb 4 oz (73.6 kg)  12/13/20 166 lb (75.3 kg)    General appearance: alert, cooperative and appears stated age Ears: normal TM's and external ear canals both ears Throat: lips, mucosa, and tongue normal; teeth and gums normal Neck: no adenopathy, no carotid bruit, supple, symmetrical, trachea midline and thyroid not enlarged, symmetric, no tenderness/mass/nodules Back: symmetric, no curvature. ROM normal. No CVA tenderness. Lungs: clear to auscultation bilaterally Heart: regular rate and rhythm, S1, S2 normal, no murmur, click, rub or gallop Abdomen: soft, non-tender; bowel sounds normal; no masses,  no organomegaly Pulses: 2+ and symmetric Skin: Skin color, texture, turgor normal. No rashes or lesions Lymph nodes: Cervical, supraclavicular, and axillary nodes normal.  No results found for: HGBA1C  Lab Results  Component Value Date   CREATININE 0.84 07/26/2021   CREATININE 0.83 01/12/2021    CREATININE 0.87 07/28/2020    Lab Results  Component Value Date   WBC 8.3 07/26/2021   HGB 12.0 07/26/2021   HCT 35.9 (L) 07/26/2021   PLT 179.0 07/26/2021   GLUCOSE 93 07/26/2021   CHOL 135 07/26/2021   TRIG 88.0 07/26/2021   HDL 55.60 07/26/2021   LDLDIRECT 104.4 02/27/2011   LDLCALC 62 07/26/2021   ALT 13 07/26/2021   AST 19 07/26/2021   NA 131 (L) 07/26/2021   K 4.6 07/26/2021   CL 94 (L) 07/26/2021   CREATININE 0.84 07/26/2021   BUN 20 07/26/2021   CO2 32 07/26/2021   TSH 4.08 07/26/2021   INR 1.64 (H) 10/14/2014    No results found.  Assessment & Plan:   Problem List Items Addressed This Visit     Hypothyroidism (Chronic)    Thyroid function is normal on current dose of levothyroxine  Lab Results  Component Value  Date   TSH 4.08 07/26/2021         Relevant Orders   TSH (Completed)   Hyperlipidemia - Primary (Chronic)    Lipids are at goal. (LDL  70).  No changes to regimen   Lab Results  Component Value Date   CHOL 135 07/26/2021   HDL 55.60 07/26/2021   LDLCALC 62 07/26/2021   LDLDIRECT 104.4 02/27/2011   TRIG 88.0 07/26/2021   CHOLHDL 2 07/26/2021         Relevant Medications   losartan (COZAAR) 50 MG tablet   Other Relevant Orders   Lipid panel (Completed)   Comprehensive metabolic panel (Completed)   Essential hypertension (Chronic)    Per [facility] DC summary,  No changes were made to regimen of losartan 50 mg and carvedilol 3.125 mg bid/       Relevant Medications   losartan (COZAAR) 50 MG tablet   Urinary incontinence, urge    Improved but not resolved myrbetriq/  Continue use of adult incontinence garments.       Nocturia more than twice per night    Improved with myrbetriq ,  Now voiding once per night       Long-term use of high-risk medication   Relevant Orders   Magnesium (Completed)   Iron deficiency anemia   Relevant Orders   CBC with Differential/Platelet (Completed)   IBC + Ferritin (Completed)   General weakness     Chronic, aggravated by recent UTI.  Continue  home PT to improve strength and transition to rolling walker.       Other Visit Diagnoses     Recurrent UTI       Relevant Orders   Urine Culture   Urinalysis, Routine w reflex microscopic (Completed)       I spent 30 minutes dedicated to the care of this patient on the date of this encounter to include pre-visit review of patient's medical history,  most recent imaging studies, Face-to-face time with the patient , and post visit ordering of testing and therapeutics.    Follow-up: Return in about 6 months (around 01/23/2022).   [name], MD

## 2021-07-26 NOTE — Patient Instructions (Signed)
If there is no UTI based on today's specimen,  you can try increasing the myrbetriq to 50 mg daily    The constipation may respond to 250 mg magnesium citrate taken daily  .  This can be combined with a stool softner or a bulk forming laxative.  The sennakot is the LAST thing to use because it is addictive to the bowels  Please take a probiotic ( Align, Floraque or Culturelle),   or eat  a serving of yogurt daily for  3  weeks  to prevent a serious antibiotic associated diarrhea  Called clostridium dificile colitis.  Taking a probiotic may also prevent vaginitis due to yeast infections and can be continued indefinitely if you feel that it improves your digestion or your elimination (bowels).

## 2021-07-28 LAB — URINE CULTURE
MICRO NUMBER:: 13048654
SPECIMEN QUALITY:: ADEQUATE

## 2021-07-28 MED ORDER — LOSARTAN POTASSIUM 50 MG PO TABS: 50.0000 mg | ORAL_TABLET | Freq: Every day | ORAL | 0 refills | Status: DC

## 2021-07-28 NOTE — Assessment & Plan Note (Signed)
Improved with myrbetriq ,  Now voiding once per night

## 2021-07-28 NOTE — Assessment & Plan Note (Signed)
Thyroid function is normal on current dose of levothyroxine  Lab Results  Component Value Date   TSH 4.08 07/26/2021

## 2021-07-28 NOTE — Assessment & Plan Note (Signed)
Per [facility] DC summary,  No changes were made to regimen of losartan 50 mg and carvedilol 3.125 mg bid/

## 2021-07-28 NOTE — Assessment & Plan Note (Signed)
Lipids are at goal. (LDL  70).  No changes to regimen   Lab Results  Component Value Date   CHOL 135 07/26/2021   HDL 55.60 07/26/2021   LDLCALC 62 07/26/2021   LDLDIRECT 104.4 02/27/2011   TRIG 88.0 07/26/2021   CHOLHDL 2 07/26/2021

## 2021-07-28 NOTE — Assessment & Plan Note (Addendum)
Improved but not resolved myrbetriq/  Continue use of adult incontinence garments.

## 2021-07-28 NOTE — Assessment & Plan Note (Signed)
Chronic, aggravated by recent UTI.  Continue  home PT to improve strength and transition to rolling walker.

## 2021-07-29 ENCOUNTER — Other Ambulatory Visit: Payer: Self-pay | Admitting: Internal Medicine

## 2021-07-29 ENCOUNTER — Encounter: Payer: Self-pay | Admitting: Internal Medicine

## 2021-07-29 MED ORDER — CEFDINIR 300 MG PO CAPS: 300.0000 mg | ORAL_CAPSULE | Freq: Two times a day (BID) | ORAL | 0 refills | Status: DC

## 2021-07-30 ENCOUNTER — Other Ambulatory Visit: Payer: Self-pay | Admitting: Internal Medicine

## 2021-07-30 MED ORDER — LOSARTAN POTASSIUM 25 MG PO TABS: 25.0000 mg | ORAL_TABLET | Freq: Every day | ORAL | 0 refills | Status: DC

## 2021-08-08 ENCOUNTER — Encounter: Payer: Self-pay | Admitting: Internal Medicine

## 2021-08-08 DIAGNOSIS — M25562 Pain in left knee: Secondary | ICD-10-CM

## 2021-08-08 DIAGNOSIS — G8929 Other chronic pain: Secondary | ICD-10-CM

## 2021-08-08 DIAGNOSIS — R2689 Other abnormalities of gait and mobility: Secondary | ICD-10-CM

## 2021-08-08 DIAGNOSIS — R2681 Unsteadiness on feet: Secondary | ICD-10-CM

## 2021-08-08 DIAGNOSIS — R531 Weakness: Secondary | ICD-10-CM

## 2021-08-08 DIAGNOSIS — M25552 Pain in left hip: Secondary | ICD-10-CM

## 2021-08-08 DIAGNOSIS — M546 Pain in thoracic spine: Secondary | ICD-10-CM

## 2021-08-10 NOTE — Telephone Encounter (Signed)
DME's have been printed and placed in quick sign folder for signature.  ?

## 2021-08-13 ENCOUNTER — Other Ambulatory Visit: Payer: Self-pay | Admitting: Internal Medicine

## 2021-08-14 ENCOUNTER — Other Ambulatory Visit: Payer: Self-pay

## 2021-08-14 ENCOUNTER — Encounter: Payer: Self-pay | Admitting: Nurse Practitioner

## 2021-08-14 ENCOUNTER — Ambulatory Visit: Payer: Medicare HMO | Admitting: Nurse Practitioner

## 2021-08-14 VITALS — BP 124/70 | HR 64 | Ht 62.0 in | Wt 160.0 lb

## 2021-08-14 DIAGNOSIS — I38 Endocarditis, valve unspecified: Secondary | ICD-10-CM

## 2021-08-14 DIAGNOSIS — I5032 Chronic diastolic (congestive) heart failure: Secondary | ICD-10-CM | POA: Diagnosis not present

## 2021-08-14 DIAGNOSIS — E785 Hyperlipidemia, unspecified: Secondary | ICD-10-CM | POA: Diagnosis not present

## 2021-08-14 DIAGNOSIS — I10 Essential (primary) hypertension: Secondary | ICD-10-CM | POA: Diagnosis not present

## 2021-08-14 DIAGNOSIS — I2510 Atherosclerotic heart disease of native coronary artery without angina pectoris: Secondary | ICD-10-CM

## 2021-08-14 NOTE — Patient Instructions (Signed)
Medication Instructions:  ? ?Your physician recommends that you continue on your current medications as directed. Please refer to the Current Medication list given to you today. ? ?*If you need a refill on your cardiac medications before your next appointment, please call your pharmacy* ? ? ?Lab Work: ? ?None ordered ? ?Testing/Procedures: ? ?None ordered ? ? ?Follow-Up: ?At [facility], you and your health needs are our priority.  As part of our continuing mission to provide you with exceptional heart care, we have created designated Provider Care Teams.  These Care Teams include your primary Cardiologist (physician) and Advanced Practice Providers (APPs -  Physician Assistants and Nurse Practitioners) who all work together to provide you with the care you need, when you need it. ? ?We recommend signing up for the patient portal called "MyChart".  Sign up information is provided on this After Visit Summary.  MyChart is used to connect with patients for Virtual Visits (Telemedicine).  Patients are able to view lab/test results, encounter notes, upcoming appointments, etc.  Non-urgent messages can be sent to your provider as well.   ?To learn more about what you can do with MyChart, go to [email or web address].   ? ?Your next appointment:   ?4 - 6 month(s) ? ?The format for your next appointment:   ?In Person ? ?Provider:   ?You may see [name], MD or one of the following Advanced Practice Providers on your designated Care Team:   ?Murray Hodgkins, NP ?

## 2021-08-14 NOTE — Progress Notes (Signed)
Office Visit    Patient Name: Jenna Marshall Date of Encounter: 08/14/2021  Primary Care Provider:  Sherlene Shams, MD Primary Cardiologist:  Jenna Bears, MD  Chief Complaint    88 year old female with a history of CAD status post CABG, hypertension, hyperlipidemia, hypothyroidism, GERD, diastolic dysfunction, depression, arthritis, and aortic insufficiency, who presents for follow-up of CAD and hypertension.  Past Medical History    Past Medical History:  Diagnosis Date   Aortic insufficiency    a. 03/2017 Echo: Mod AI; b. 02/2019 Echo: Mod AI.   Arthritis    CAD (coronary artery disease)    a. Lexiscan 10/13/14: mid anterior to apical & inf wall ischemia w/ WMA, mild to mod dep EF; b. Cath 10/2014: LM 50ost, LM 99, ost LAD 95, 40p/m, RCA 30p, 70m; b. 10/2014 CABG x 4 (LIMA->LAD, VG->RCA, VG->RI->OM).   Depression    Diastolic dysfunction    a. 10/2014 Echo: EF 55-60%, no RWMA, GR1DD; b. 03/2017 Echo: EF 60-65%, mild LVH, Gr2 DD; c. 02/2019 Echo: EF 55-60%, sev LVH. Diast dysfxn. No rwma. RVSP 41.15mmHg. Mildly dil LA. Mild to mod MR. Mod TR. Mod AI.   GERD (gastroesophageal reflux disease)    Hyperlipidemia    Hypertension    Hypothyroidism    Macular degeneration    pernicious anemia    Varicose veins    Wears dentures    partial upper   Wears hearing aid    right   Past Surgical History:  Procedure Laterality Date   ANTERIOR VITRECTOMY Right 07/16/2016   Procedure: ANTERIOR VITRECTOMY;  Surgeon: Jenna Crane, MD;  Location: [facility];  Service: Ophthalmology;  Laterality: Right;   CARDIAC CATHETERIZATION Left 10/14/2014   Procedure: Left Heart Cath and Coronary Angiography;  Surgeon: Jenna Ouch, MD;  Location: [facility] INVASIVE CV LAB;  Service: Cardiovascular;  Laterality: Left;   CATARACT EXTRACTION W/PHACO Left 04/30/2016   Procedure: CATARACT EXTRACTION PHACO AND INTRAOCULAR LENS PLACEMENT (IOC);  Surgeon: Jenna Crane, MD;  Location: [facility];  Service: Ophthalmology;  Laterality: Left;  LEFT   CATARACT EXTRACTION W/PHACO Right 07/16/2016   Procedure: CATARACT EXTRACTION PHACO AND INTRAOCULAR LENS PLACEMENT (IOC)  Right;  Surgeon: Jenna Crane, MD;  Location: [facility];  Service: Ophthalmology;  Laterality: Right;   CORONARY ARTERY BYPASS GRAFT N/A 10/14/2014   Procedure: CORONARY ARTERY BYPASS GRAFTING (CABG), ON PUMP, TIMES FOUR, USING LEFT INTERNAL MAMMARY ARTERY, RIGHT GREATER SAPHENOUS VEIN HARVESTED ENDOSCOPICALLY;  Surgeon: Jenna Borne, MD;  Location: [facility] OR;  Service: Open Heart Surgery;  Laterality: N/A;   CORONARY ARTERY BYPASS GRAFT  10/2014   LIMA-->LAD, SVG-->RCA, sequential SVG-->Ramus and OM   HEMORRHOID SURGERY     HIP ARTHROPLASTY Left 01/14/2015   Procedure: ARTHROPLASTY BIPOLAR HIP (HEMIARTHROPLASTY);  Surgeon: Jenna Heinz, MD;  Location: [facility] ORS;  Service: Orthopedics;  Laterality: Left;   VARICOSE VEIN SURGERY      Allergies  Allergies  Allergen Reactions   Ambien [Zolpidem] Other (See Comments)    Reaction:  Hallucinations   Codeine Nausea And Vomiting   Lasix [Furosemide] Itching and Rash    History of Present Illness    88 year old female with the above complex past medical history including CAD, hypertension, hyperlipidemia, GERD, hypothyroidism, diastolic dysfunction, aortic insufficiency, depression, and arthritis.  In May 2016, she underwent stress testing in the setting of chest discomfort, which was abnormal.  This was followed by diagnostic catheterization which showed severe left  main and ostial LAD disease.  She subsequently underwent CABG x4.  About 3 months after bypass surgery, she fractured her left hip, and required left hip hemiarthroplasty.  She subsequently recovered well.  Most recent echo in September 2020, showed an EF of 55 6%, severe LVH, diastolic dysfunction, elevated RVSP of 41.9 mmHg, moderate AI, mild to moderate MR, and moderate TR.  [name] has  done reasonably well over the past few years and was last seen in cardiology clinic in August 2022.  She was hypertensive at that time and low-dose losartan was added to her regimen.  She was admitted to [facility] in the fall 2022 in the setting of altered mental status and UTI.  She was subsequently discharged to skilled rehab secondary to ongoing weakness and later discharged to home in early February.  Though she is currently in her own home and receiving home health PT/OT/RN, she will be moving in with her daughter soon.  Patient says that her leg strength is improving but activity is still limited, largely by arthritis of her knees.  She is looking forward to moving in with her daughter and notes that overall, she feels well.  She denies chest pain, dyspnea, palpitations, PND, orthopnea, dizziness, syncope, edema, or early satiety.  Her blood pressure was initially elevated today however better on repeat.  Home health checks her blood pressure few times a week and her daughter notes that it is always in the 120-130 range.  Home Medications    Current Outpatient Medications  Medication Sig Dispense Refill   acetaminophen (TYLENOL) 325 MG tablet Take 2 tablets (650 mg total) by mouth every 6 (six) hours as needed for mild pain.     aspirin 81 MG chewable tablet Chew 1 tablet (81 mg total) by mouth daily. 30 tablet 0   atorvastatin (LIPITOR) 40 MG tablet TAKE ONE TABLET BY MOUTH ONCE DAILY 30 tablet 3   Calcium Carbonate-Vitamin D 600-200 MG-UNIT TABS Take 1 tablet by mouth 2 (two) times daily.     carvedilol (COREG) 3.125 MG tablet TAKE ONE TABLET BY MOUTH TWICE DAILY WITH A MEAL 180 tablet 1   Cholecalciferol 50 MCG (2000 UT) TBDP Take 2,000 Units by mouth.     levothyroxine (SYNTHROID) 25 MCG tablet TAKE ONE TABLET BY MOUTH EVERY MORNING TAKE WITH BREAKFAST 90 tablet 0   losartan (COZAAR) 25 MG tablet Take 1 tablet (25 mg total) by mouth daily. 90 tablet 0   Multiple Vitamin (MULTIVITAMIN WITH MINERALS)  TABS tablet Take 1 tablet by mouth daily.     Multiple Vitamins-Minerals (PRESERVISION AREDS 2) CAPS Take 1 capsule by mouth 2 (two) times daily.      MYRBETRIQ 25 MG TB24 tablet TAKE ONE TABLET BY MOUTH ONCE DAILY 30 tablet 5   pantoprazole (PROTONIX) 40 MG tablet TAKE ONE TABLET BY MOUTH ONCE DAILY 90 tablet 1   senna-docusate (SENOKOT-S) 8.6-50 MG per tablet Take 1 tablet by mouth 2 (two) times daily. 30 tablet 0   torsemide (DEMADEX) 10 MG tablet Take 1 tablet daily as needed for shortness of breath or weight gain 3 lbs or greater overnight.     No current facility-administered medications for this visit.     Review of Systems    Bothered by arthritis of her knees, which limits her activity.  She denies chest pain, palpitations, dyspnea, pnd, orthopnea, n, v, dizziness, syncope, edema, weight gain, or early satiety.  All other systems reviewed and are otherwise negative except as noted  above.    Physical Exam    VS:  BP (!) 170/78 (BP Location: Right Arm, Patient Position: Sitting, Cuff Size: Large)   Pulse 71   Ht 5\' 2"  (1.575 m)   Wt 160 lb (72.6 kg)   SpO2 97%   BMI 29.26 kg/m  , BMI Body mass index is 29.26 kg/m.     Vitals:   08/14/21 1052 08/14/21 1138  BP: (!) 170/78 124/70  Pulse: 71   SpO2: 97%     GEN: Well nourished, well developed, in no acute distress. HEENT: normal. Neck: Supple, no JVD, carotid bruits, or masses. Cardiac: RRR, 2/6 systolic murmur throughout, no rubs or gallops. No clubbing, cyanosis, edema.  Radials 2+/DP/PT 1+ and equal bilaterally.  Respiratory:  Respirations regular and unlabored, clear to auscultation bilaterally. GI: Soft, nontender, nondistended, BS + x 4. MS: no deformity or atrophy. Skin: warm and dry, no rash. Neuro:  Strength and sensation are intact. Psych: Normal affect.  Accessory Clinical Findings    ECG personally reviewed by me today -regular sinus rhythm, 64, LVH- no acute changes.  Lab Results  Component Value Date    WBC 8.3 07/26/2021   HGB 12.0 07/26/2021   HCT 35.9 (L) 07/26/2021   MCV 96.0 07/26/2021   PLT 179.0 07/26/2021   Lab Results  Component Value Date   CREATININE 0.84 07/26/2021   BUN 20 07/26/2021   NA 131 (L) 07/26/2021   K 4.6 07/26/2021   CL 94 (L) 07/26/2021   CO2 32 07/26/2021   Lab Results  Component Value Date   ALT 13 07/26/2021   AST 19 07/26/2021   ALKPHOS 61 07/26/2021   BILITOT 0.7 07/26/2021   Lab Results  Component Value Date   CHOL 135 07/26/2021   HDL 55.60 07/26/2021   LDLCALC 62 07/26/2021   LDLDIRECT 104.4 02/27/2011   TRIG 88.0 07/26/2021   CHOLHDL 2 07/26/2021      Assessment & Plan    1.  Coronary artery disease: Status post CABG x4 in 2016.  She has continued to do well without chest pain or dyspnea.  She is working with physical therapy in the setting of ongoing weakness and arthritis in her legs but is otherwise tolerating that well.  She remains on aspirin, statin, beta-blocker, and ARB therapy.  2.  Essential hypertension: Blood pressure initially elevated at 170/78.  I did repeat this and got 124/70, which her daughter notes is more in line with what has been seen when checked by home health.  She is currently taking carvedilol 3.125 mg twice daily and losartan 25 mg daily.  Daughter notes that the losartan dose was increased recently to 50 mg but that she has been taking 25.  As blood pressure is stable on repeat today and with prior history of whitecoat hypertension, she will continue current doses of carvedilol and losartan we will hold off on further titration unless trends at home are seem to be higher.  3.  Hyperlipidemia: LDL of 62 in February.  She remains on statin therapy.  4.  Chronic heart failure with preserved EF: Euvolemic on examination today.  Heart rate and blood pressure stable (see above regarding blood pressure).  5.  Valvular heart disease including mild to moderate mitral regurgitation, moderate tricuspid regurgitation,  and moderate aortic insufficiency: No significant symptoms.  With advanced age, defer repeat imaging.  6.  Disposition: Follow-up in clinic in 6 months or sooner if necessary.   Nicolasa Ducking, NP 08/14/2021, 11:16  AM

## 2021-08-16 ENCOUNTER — Telehealth: Payer: Self-pay | Admitting: Internal Medicine

## 2021-08-16 NOTE — Telephone Encounter (Signed)
noted 

## 2021-08-16 NOTE — Telephone Encounter (Signed)
Pt daughter called and said pt other daughter Truett Mainland) is coming by to pick up orders. Pt daughter Ciro Backer) said she should be by at 3:30 ?

## 2021-09-16 ENCOUNTER — Encounter: Payer: Self-pay | Admitting: Internal Medicine

## 2021-09-17 MED ORDER — ATORVASTATIN CALCIUM 40 MG PO TABS: 40.0000 mg | ORAL_TABLET | Freq: Every day | ORAL | 3 refills | Status: DC

## 2021-09-23 ENCOUNTER — Other Ambulatory Visit: Payer: Self-pay | Admitting: Internal Medicine

## 2021-09-25 MED ORDER — LEVOTHYROXINE SODIUM 25 MCG PO TABS: 25.0000 ug | ORAL_TABLET | Freq: Every day | ORAL | 3 refills | Status: DC

## 2021-09-26 DIAGNOSIS — H353211 Exudative age-related macular degeneration, right eye, with active choroidal neovascularization: Secondary | ICD-10-CM | POA: Diagnosis not present

## 2021-10-07 ENCOUNTER — Encounter: Payer: Self-pay | Admitting: Internal Medicine

## 2021-10-08 MED ORDER — CARVEDILOL 3.125 MG PO TABS: 3.1250 mg | ORAL_TABLET | Freq: Two times a day (BID) | ORAL | 3 refills | Status: DC

## 2021-10-11 ENCOUNTER — Telehealth: Payer: Self-pay

## 2021-10-11 ENCOUNTER — Encounter: Payer: Self-pay | Admitting: Internal Medicine

## 2021-10-11 MED ORDER — LOSARTAN POTASSIUM 25 MG PO TABS
25.0000 mg | ORAL_TABLET | Freq: Every day | ORAL | 0 refills | Status: DC
Start: 2021-10-11 — End: 2021-10-15

## 2021-10-15 MED ORDER — LOSARTAN POTASSIUM 25 MG PO TABS: 25.0000 mg | ORAL_TABLET | Freq: Every day | ORAL | 0 refills | Status: DC

## 2021-10-15 NOTE — Telephone Encounter (Signed)
Pt need refill on lorsartan sent to [facility] in [place] ?

## 2021-10-16 NOTE — Telephone Encounter (Signed)
See mychart message. Medication was refilled yesterday evening.  ?

## 2021-12-10 ENCOUNTER — Other Ambulatory Visit: Payer: Self-pay | Admitting: Internal Medicine

## 2021-12-11 ENCOUNTER — Ambulatory Visit: Payer: Medicare HMO | Admitting: Nurse Practitioner

## 2021-12-11 ENCOUNTER — Encounter: Payer: Self-pay | Admitting: Nurse Practitioner

## 2021-12-11 VITALS — BP 188/96 | HR 67 | Ht 62.0 in | Wt 156.8 lb

## 2021-12-11 DIAGNOSIS — I5032 Chronic diastolic (congestive) heart failure: Secondary | ICD-10-CM | POA: Diagnosis not present

## 2021-12-11 DIAGNOSIS — Z79899 Other long term (current) drug therapy: Secondary | ICD-10-CM

## 2021-12-11 DIAGNOSIS — I10 Essential (primary) hypertension: Secondary | ICD-10-CM | POA: Diagnosis not present

## 2021-12-11 DIAGNOSIS — E785 Hyperlipidemia, unspecified: Secondary | ICD-10-CM | POA: Diagnosis not present

## 2021-12-11 DIAGNOSIS — I2510 Atherosclerotic heart disease of native coronary artery without angina pectoris: Secondary | ICD-10-CM | POA: Diagnosis not present

## 2021-12-11 DIAGNOSIS — I38 Endocarditis, valve unspecified: Secondary | ICD-10-CM

## 2021-12-11 NOTE — Progress Notes (Signed)
Office Visit    Patient Name: Jenna Marshall Date of Encounter: 12/11/2021  Primary Care Provider:  [name], MD Primary Cardiologist:  [name], MD  Chief Complaint    88 year old female with history of CAD status post CABG, hypertension, hyperlipidemia, hypothyroidism, GERD, diastolic dysfunction, depression, arthritis, and aortic insufficiency, who presents for follow-up of CAD and hypertension.  Past Medical History    Past Medical History:  Diagnosis Date   Aortic insufficiency    a. 03/2017 Echo: Mod AI; b. 02/2019 Echo: Mod AI.   Arthritis    CAD (coronary artery disease)    a. Lexiscan 10/13/14: mid anterior to apical & inf wall ischemia w/ WMA, mild to mod dep EF; b. Cath 10/2014: LM 50ost, LM 99, ost LAD 95, 40p/m, RCA 30p, 34m b. 10/2014 CABG x 4 (LIMA->LAD, VG->RCA, VG->RI->OM).   Depression    Diastolic dysfunction    a. 10/2014 Echo: EF 55-60%, no RWMA, GR1DD; b. 03/2017 Echo: EF 60-65%, mild LVH, Gr2 DD; c. 02/2019 Echo: EF 55-60%, sev LVH. Diast dysfxn. No rwma. RVSP 41.945mg. Mildly dil LA. Mild to mod MR. Mod TR. Mod AI.   GERD (gastroesophageal reflux disease)    Hyperlipidemia    Hypertension    Hypothyroidism    Macular degeneration    pernicious anemia    Varicose veins    Wears dentures    partial upper   Wears hearing aid    right   Past Surgical History:  Procedure Laterality Date   ANTERIOR VITRECTOMY Right 07/16/2016   Procedure: ANTERIOR VITRECTOMY;  Surgeon: BrEulogio BearMD;  [place] [facility] [place]: [place];  Laterality: Right;   CARDIAC CATHETERIZATION Left 10/14/2014   Procedure: Left Heart Cath and Coronary Angiography;  Surgeon: MuWellington HampshireMD;  [facility];  Service: Cardiovascular;  Laterality: Left;   CATARACT EXTRACTION W/PHACO Left 04/30/2016   Procedure: CATARACT EXTRACTION PHACO AND INTRAOCULAR LENS PLACEMENT (IOC);  Surgeon: BrEulogio BearMD;  [place]: Ophthal[facility];  Laterality: Left;  LEFT   CATARACT EXTRACTION W/PHACO Right 07/16/2016   Procedure: CATARACT EXTRACTION PHACO AND INTRAOCULAR LENS PLACEMENT ([place] Right;  Surgeon: BrEulogio BearMD;  [place] [facility]: [place];  Laterality: Right;   CORONARY ARTERY BYPASS GRAFT N/A 10/14/2014   Procedure: CORONARY ARTERY BYPASS GRAFTING (CABG), ON PUMP, TIMES FOUR, USING LEFT INTERNAL MAMMARY ARTERY, RIGHT GREATER SAPHENOUS VEIN HARVESTED ENDOSCOPICALLY;  Surgeon: BrGaye PollackMD;  [place] [facility] [place] Service: [place] [facility];  Laterality: N/A;   CORONARY ARTERY BYPASS GRAFT  10/2014   LIMA-->LAD, SVG-->RCA, sequential SVG-->Ramus and OM   HEMORRHOID SURGERY     HIP ARTHROPLASTY Left 01/14/2015   Procedure: ARTHROPLASTY BIPOLAR HIP (HEMIARTHROPLASTY);  Surgeon: JaDereck LeepMD;  Location: [facility];  Service: Orthopedics;  Laterality: Left;   VARICOSE VEIN SURGERY      Allergies  Allergies  Allergen Reactions   Ambien [Zolpidem] Other (See Comments)    Reaction:  Hallucinations   Codeine Nausea And Vomiting   Lasix [Furosemide] Itching and Rash    History of Present Illness    [age]ear old female with above complex past medical history including CAD, hypertension, hyperlipidemia, GERD, hypothyroidism, diastolic dysfunction, aortic insufficiency, depression, and arthritis.  In May 2016, she underwent stress testing in the setting of chest discomfort, which was abnormal.  This was followed by diagnostic catheterization, which showed severe left main and  ostial LAD disease.  She subsequently underwent CABG x4.  Approximately 3 months after bypass surgery, she fractured her left hip and required left hip hemiarthroplasty.  She subsequently recovered well.  Most recent echo in September 2020, showed an EF of 55 to 60% with severe LVH, diastolic dysfunction, elevated RVSP of 41.9 mmHg, moderate AI, mild to moderate MR, and moderate  TR.  In the fall 2022, Ms. Parlier was admitted to [facility] with altered mental status and UTI.  She was subsequently discharged to skilled rehab secondary to ongoing weakness and later discharged home in February 2023.  She was last seen in clinic on August 14, 2021, she was doing reasonably well and continued to receive home health PT/OT/nursing, and she plans to move in with her daughter.  She is currently living some days on her home with family close by as well as in-home assistance, and other days with her daughter.  She notes that she is doing well and denies chest pain, dyspnea, palpitations, PND, orthopnea, dizziness, syncope, edema, or early satiety.  She keeps busy during the day and also does about 15 to 20 minutes of light exercises and walking in her hallway.  She periodically checks her blood pressure and notes that it is always "normal."  Blood pressure is elevated today and says that this is because of whitecoat hypertension.  Home Medications    Current Outpatient Medications  Medication Sig Dispense Refill   acetaminophen (TYLENOL) 325 MG tablet Take 2 tablets (650 mg total) by mouth every 6 (six) hours as needed for mild pain.     aspirin 81 MG chewable tablet Chew 1 tablet (81 mg total) by mouth daily. 30 tablet 0   atorvastatin (LIPITOR) 40 MG tablet TAKE 1 TABLET(40 MG) BY MOUTH DAILY 90 tablet 3   Calcium Carbonate-Vitamin D 600-200 MG-UNIT TABS Take 1 tablet by mouth 2 (two) times daily.     carvedilol (COREG) 3.125 MG tablet Take 1 tablet (3.125 mg total) by mouth 2 (two) times daily with a meal. 180 tablet 3   Cholecalciferol 50 MCG (2000 UT) TBDP Take 2,000 Units by mouth.     levothyroxine (SYNTHROID) 25 MCG tablet Take 1 tablet (25 mcg total) by mouth daily before breakfast. 90 tablet 3   losartan (COZAAR) 25 MG tablet Take 1 tablet (25 mg total) by mouth daily. 90 tablet 0   Multiple Vitamin (MULTIVITAMIN WITH MINERALS) TABS tablet Take 1 tablet by mouth daily.     Multiple  Vitamins-Minerals (PRESERVISION AREDS 2) CAPS Take 1 capsule by mouth 2 (two) times daily.      MYRBETRIQ 25 MG TB24 tablet TAKE ONE TABLET BY MOUTH ONCE DAILY 30 tablet 5   pantoprazole (PROTONIX) 40 MG tablet TAKE ONE TABLET BY MOUTH ONCE DAILY 90 tablet 1   senna-docusate (SENOKOT-S) 8.6-50 MG per tablet Take 1 tablet by mouth 2 (two) times daily. 30 tablet 0   torsemide (DEMADEX) 10 MG tablet Take 1 tablet daily as needed for shortness of breath or weight gain 3 lbs or greater overnight.     No current facility-administered medications for this visit.     Review of Systems    She denies chest pain, palpitations, dyspnea, pnd, orthopnea, n, v, dizziness, syncope, edema, weight gain, or early satiety.  All other systems reviewed and are otherwise negative except as noted above.    Physical Exam    VS:  BP (!) 188/96   Pulse 67   Ht '5\' 2"'$  (1.575 m)  Wt 156 lb 12.8 oz (71.1 kg)   SpO2 96%   BMI 28.68 kg/m  , BMI Body mass index is 28.68 kg/m.     Vitals:   12/11/21 1042 12/11/21 1112  BP: (!) 193/73 (!) 188/96  Pulse: 67   SpO2: 96%     GEN: Well nourished, well developed, in no acute distress. HEENT: normal. Neck: Supple, no JVD, carotid bruits, or masses. Cardiac: RRR, 2/6 syst murmur throughout. No clubbing, cyanosis, trace bilateral ankle edema.  Radials/PT 2+ and equal bilaterally.  Respiratory:  Respirations regular and unlabored, clear to auscultation bilaterally. GI: Soft, nontender, nondistended, BS + x 4. MS: no deformity or atrophy. Skin: warm and dry, no rash. Neuro:  Strength and sensation are intact. Psych: Normal affect.  Accessory Clinical Findings    ECG personally reviewed by me today -regular sinus rhythm, 67, septal infarct, LVH- no acute changes.  Lab Results  Component Value Date   WBC 8.3 07/26/2021   HGB 12.0 07/26/2021   HCT 35.9 (L) 07/26/2021   MCV 96.0 07/26/2021   PLT 179.0 07/26/2021   Lab Results  Component Value Date    CREATININE 0.84 07/26/2021   BUN 20 07/26/2021   NA 131 (L) 07/26/2021   K 4.6 07/26/2021   CL 94 (L) 07/26/2021   CO2 32 07/26/2021   Lab Results  Component Value Date   ALT 13 07/26/2021   AST 19 07/26/2021   ALKPHOS 61 07/26/2021   BILITOT 0.7 07/26/2021   Lab Results  Component Value Date   CHOL 135 07/26/2021   HDL 55.60 07/26/2021   LDLCALC 62 07/26/2021   LDLDIRECT 104.4 02/27/2011   TRIG 88.0 07/26/2021   CHOLHDL 2 07/26/2021     Assessment & Plan    1.  Essential hypertension/? White coat HTN: Patient with some history of whitecoat hypertension.  Blood pressure was 200/78 initially with minimal improvement on multiple repeat checks (188/96).  She says that her blood pressure is always "normal" when she checks it at home but has not checked it in a few weeks.  At her last visit, pressure was elevated initially but on repeat dropped to 124/70.  I have asked them to go home and check her blood pressure and if she has a systolic greater than 580, they will call us later today and I will plan to increase losartan to 50 mg daily.  Otherwise, they will follow blood pressure trends at home and present them in their home cuff for a nurse visit in a week.  2.  Coronary artery disease: Status post For 2016.  She has continued to do well without chest pain or dyspnea is exercising some each day.  She remains on aspirin, statin, beta-blocker, and ARB therapy.  3.  Hyperlipidemia: LDL of 62 in February with normal LFTs at that time.  She remains on statin therapy.  4.  Chronic heart failure with preserved EF: Trace ankle edema on examination today but otherwise euvolemic.  She does have multiple varicosities.  Her weight is actually down 4 pounds since her last visit.  Recommend routine leg elevation and salt avoidance as well as compression as needed.  She does have torsemide to use as needed, but has not required this.  I suspect, that we will need to adjust her blood pressure  medications as outlined above.  5.  Valvular heart disease including mild to moderate mitral regurgitation, moderate tricuspid regurgitation, and moderate aortic insufficiency: No significant symptoms.  With advanced age,  she is being managed conservatively without plan for repeat imaging at this time.  6.  Disposition: Patient to contact us this afternoon with blood pressure recording.  Nurse visit and blood pressure check in 1 week.   Murray Hodgkins, NP 12/11/2021, 11:13 AM  Addendum  Pts dtr contacted [place] via mychart message following clinic visit.  Home BPs 150/60 and 140/56.  We will increase her losartan to '50mg'$  daily and order a f/u bmet in 1 wk.  Pt to continue to follow BPs @ home.  Murray Hodgkins, NP 12/12/2021, 8:40 AM

## 2021-12-11 NOTE — Patient Instructions (Addendum)
Today when you get home check your blood pressures and call us with those readings. If your top blood pressure number is greater than 140 please let us know.    Please monitor blood pressures and keep a log of your readings.   Make sure to check 2 hours after your medications.   AVOID these things for 30 minutes before checking your blood pressure: No Drinking caffeine. No Drinking alcohol. No Eating. No Smoking. No Exercising.  Five minutes before checking your blood pressure: Pee. Sit in a dining chair. Avoid sitting in a soft couch or armchair. Be quiet. Do not talk.  Medication Instructions:  No changes at this time.   *If you need a refill on your cardiac medications before your next appointment, please call your pharmacy*   Lab Work: None  If you have labs (blood work) drawn today and your tests are completely normal, you will receive your results only by: [place] (if you have MyChart) OR A paper copy in the mail If you have any lab test that is abnormal or we need to change your treatment, we will call you to review the results.   Testing/Procedures: None   Follow-Up: At [facility], you and your health needs are our priority.  As part of our continuing mission to provide you with exceptional heart care, we have created designated Provider Care Teams.  These Care Teams include your primary Cardiologist (physician) and Advanced Practice Providers (APPs -  Physician Assistants and Nurse Practitioners) who all work together to provide you with the care you need, when you need it.   Your next appointment:   2 month(s)  The format for your next appointment:   In Person  Provider:   [name], MD or Murray Hodgkins, NP    Other Instructions Nurse visit in one week and please bring both of your blood pressure cuffs that you use at home.     Important Information About Sugar

## 2021-12-13 MED ORDER — LOSARTAN POTASSIUM 50 MG PO TABS: 50.0000 mg | ORAL_TABLET | Freq: Every day | ORAL | 0 refills | Status: DC

## 2021-12-17 ENCOUNTER — Ambulatory Visit (INDEPENDENT_AMBULATORY_CARE_PROVIDER_SITE_OTHER): Payer: Medicare HMO

## 2021-12-17 ENCOUNTER — Ambulatory Visit (INDEPENDENT_AMBULATORY_CARE_PROVIDER_SITE_OTHER): Payer: Medicare HMO | Admitting: *Deleted

## 2021-12-17 ENCOUNTER — Encounter: Payer: Self-pay | Admitting: *Deleted

## 2021-12-17 ENCOUNTER — Other Ambulatory Visit
Admission: RE | Admit: 2021-12-17 | Discharge: 2021-12-17 | Disposition: A | Discharge status: Home / Self Care | Payer: Medicare HMO | Source: Ambulatory Visit | Attending: Nurse Practitioner | Admitting: Nurse Practitioner

## 2021-12-17 VITALS — Ht 62.0 in | Wt 156.0 lb

## 2021-12-17 VITALS — BP 146/70 | HR 68 | Ht 62.0 in | Wt 156.5 lb

## 2021-12-17 DIAGNOSIS — I2510 Atherosclerotic heart disease of native coronary artery without angina pectoris: Secondary | ICD-10-CM | POA: Diagnosis not present

## 2021-12-17 DIAGNOSIS — Z0000 Encounter for general adult medical examination without abnormal findings: Secondary | ICD-10-CM | POA: Diagnosis not present

## 2021-12-17 DIAGNOSIS — I10 Essential (primary) hypertension: Secondary | ICD-10-CM | POA: Diagnosis not present

## 2021-12-17 DIAGNOSIS — I5032 Chronic diastolic (congestive) heart failure: Secondary | ICD-10-CM | POA: Diagnosis not present

## 2021-12-17 DIAGNOSIS — Z79899 Other long term (current) drug therapy: Secondary | ICD-10-CM | POA: Insufficient documentation

## 2021-12-17 LAB — BASIC METABOLIC PANEL
Anion gap: 5 (ref 5–15)
BUN: 23 mg/dL (ref 8–23)
CO2: 28 mmol/L (ref 22–32)
Calcium: 9.1 mg/dL (ref 8.9–10.3)
Chloride: 101 mmol/L (ref 98–111)
Creatinine, Ser: 0.7 mg/dL (ref 0.44–1.00)
GFR, Estimated: >60 mL/min (ref >60)
Glucose, Bld: 104 mg/dL — ABNORMAL HIGH (ref 70–99)
Potassium: 4.4 mmol/L (ref 3.5–5.1)
Sodium: 134 mmol/L — ABNORMAL LOW (ref 135–145)

## 2021-12-17 NOTE — Progress Notes (Cosign Needed)
Subjective:   Jenna Marshall is a 88 y.o. female who presents for Medicare Annual (Subsequent) preventive examination.  Review of Systems    No ROS.  Medicare Wellness Virtual Visit.  Visual/audio telehealth visit, UTA vital signs.   See social history for additional risk factors.   Cardiac Risk Factors include: advanced age (>51mn, >>36women)     Objective:    Today's Vitals   12/17/21 1507  Weight: 156 lb (70.8 kg)  Height: '5\' 2"'$  (1.575 m)   Body mass index is 28.53 kg/m.     12/17/2021    3:20 PM 12/13/2020   12:41 PM 08/30/2020    3:48 PM 12/13/2019    1:08 PM 12/10/2018   10:40 AM 09/17/2017   10:43 AM 09/04/2016    4:21 PM  Advanced Directives  Does Patient Have a Medical Advance Directive? Yes Yes Yes Yes Yes Yes Yes  Type of AParamedicof [place]ontpel[place]Living will HMerriam WoodsLiving will Healthcare Power of ADunellenLiving will [place]Living will Healthcare Power of AP[place]Living will  Does patient want to make changes to medical advance directive? No - Patient declined No - Patient declined  No - Patient declined No - Patient declined No - Patient declined No - Patient declined  Copy of HStoddardin Chart? Yes - validated most recent copy scanned in chart (See row information) Yes - validated most recent copy scanned in chart (See row information) No - copy requested Yes - validated most recent copy scanned in chart (See row information) Yes - validated most recent copy scanned in chart (See row information) No - copy requested No - copy requested   Current Medications (verified) Outpatient Encounter Medications as of 12/17/2021  Medication Sig   acetaminophen (TYLENOL) 325 MG tablet Take 2 tablets (650 mg total) by mouth every 6 (six) hours as needed for mild pain.   aspirin 81 MG chewable tablet Chew 1 tablet (81 mg total) by mouth  daily.   atorvastatin (LIPITOR) 40 MG tablet TAKE 1 TABLET(40 MG) BY MOUTH DAILY   Calcium Carbonate-Vitamin D 600-200 MG-UNIT TABS Take 1 tablet by mouth 2 (two) times daily.   carvedilol (COREG) 3.125 MG tablet Take 1 tablet (3.125 mg total) by mouth 2 (two) times daily with a meal.   levothyroxine (SYNTHROID) 25 MCG tablet Take 1 tablet (25 mcg total) by mouth daily before breakfast.   losartan (COZAAR) 50 MG tablet Take 1 tablet (50 mg total) by mouth daily.   Multiple Vitamin (MULTIVITAMIN WITH MINERALS) TABS tablet Take 1 tablet by mouth daily.   Multiple Vitamins-Minerals (PRESERVISION AREDS 2) CAPS Take 1 capsule by mouth 2 (two) times daily.    MYRBETRIQ 25 MG TB24 tablet TAKE ONE TABLET BY MOUTH ONCE DAILY   pantoprazole (PROTONIX) 40 MG tablet TAKE ONE TABLET BY MOUTH ONCE DAILY   senna-docusate (SENOKOT-S) 8.6-50 MG per tablet Take 1 tablet by mouth 2 (two) times daily.   torsemide (DEMADEX) 10 MG tablet Take 1 tablet daily as needed for shortness of breath or weight gain 3 lbs or greater overnight.   [DISCONTINUED] citalopram (CELEXA) 20 MG tablet Take 20 mg by mouth daily.     No facility-administered encounter medications on file as of 12/17/2021.   Allergies (verified) Ambien [zolpidem], Codeine, and Lasix [furosemide]   History: Past Medical History:  Diagnosis Date   Aortic insufficiency    a. 03/2017 Echo:  Mod AI; b. 02/2019 Echo: Mod AI.   Arthritis    CAD (coronary artery disease)    a. Lexiscan 10/13/14: mid anterior to apical & inf wall ischemia w/ WMA, mild to mod dep EF; b. Cath 10/2014: LM 50ost, LM 99, ost LAD 95, 40p/m, RCA 30p, 10m b. 10/2014 CABG x 4 (LIMA->LAD, VG->RCA, VG->RI->OM).   Depression    Diastolic dysfunction    a. 10/2014 Echo: EF 55-60%, no RWMA, GR1DD; b. 03/2017 Echo: EF 60-65%, mild LVH, Gr2 DD; c. 02/2019 Echo: EF 55-60%, sev LVH. Diast dysfxn. No rwma. RVSP 41.942mg. Mildly dil LA. Mild to mod MR. Mod TR. Mod AI.   GERD (gastroesophageal reflux  disease)    Hyperlipidemia    Hypertension    Hypothyroidism    Macular degeneration    pernicious anemia    Varicose veins    Wears dentures    partial upper   Wears hearing aid    right   Past Surgical History:  Procedure Laterality Date   ANTERIOR VITRECTOMY Right 07/16/2016   Procedure: ANTERIOR VITRECTOMY;  Surgeon: BrEulogio BearMD;  [facility]: [facility]: [facility];  Laterality: Right;   CARDIAC CATHETERIZATION Left 10/14/2014   Procedure: Left Heart Cath and Coronary Angiography;  Surgeon: MuWellington HampshireMD;  [facility];  Service: Cardiovascular;  Laterality: Left;   CATARACT EXTRACTION W/PHACO Left 04/30/2016   Procedure: CATARACT EXTRACTION PHACO AND INTRAOCULAR LENS PLACEMENT (IOC);  Surgeon: BrEulogio BearMD;  [facility]: [facility];  Laterality: Left;  LEFT   CATARACT EXTRACTION W/PHACO Right 07/16/2016   Procedure: CATARACT EXTRACTION PHACO AND INTRAOCULAR LENS PLACEMENT (IO[place] Right;  Surgeon: BrEulogio BearMD;  [facility] Service: Ophthalmology;  Laterality: Right;   CORONARY ARTERY BYPASS GRAFT N/A 10/14/2014   Procedure: CORONARY ARTERY BYPASS GRAFTING (CABG), ON PUMP, TIMES FOUR, USING LEFT INTERNAL MAMMARY ARTERY, RIGHT GREATER SAPHENOUS VEIN HARVESTED ENDOSCOPICALLY;  Surgeon: BrGaye PollackMD;  [place]: Open Heart Surgery;  Laterality: N/A;   CORONARY ARTERY BYPASS GRAFT  10/2014   LIMA-->LAD, SVG-->RCA, sequential SVG-->Ramus and OM   HEMORRHOID SURGERY     HIP ARTHROPLASTY Left 01/14/2015   Procedure: ARTHROPLASTY BIPOLAR HIP (HEMIARTHROPLASTY);  Surgeon: JaDereck LeepMD;  [facility];  Service: Orthopedics;  Laterality: Left;   VARICOSE VEIN SURGERY     Family History  Problem Relation Age of Onset   Coronary artery disease Mother    Diabetes Mother    Heart disease Father    Coronary artery disease Brother    Breast  cancer Neg Hx    Social History   Socioeconomic History   Marital status: Widowed    Spouse name: Not on file   Number of children: Not on file   Years of education: Not on file   Highest education level: Not on file  Occupational History   Not on file  Tobacco Use   Smoking status: Never   Smokeless tobacco: Never  Vaping Use   Vaping Use: Never used  Substance and Sexual Activity   Alcohol use: No   Drug use: No   Sexual activity: Never  Other Topics Concern   Not on file  Social History Narrative   ** Merged History Encounter **       Social Determinants of Health   Financial Resource Strain: Low Risk  (12/17/2021)   Overall Financial Resource Strain (CARDIA)  Difficulty of Paying Living Expenses: Not hard at all  Food Insecurity: No Food Insecurity (12/17/2021)   Hunger Vital Sign    Worried About Running Out of Food in the Last Year: Never true    Ran Out of Food in the Last Year: Never true  Transportation Needs: No Transportation Needs (12/17/2021)   PRAPARE - Hydrologist (Medical): No    Lack of Transportation (Non-Medical): No  Physical Activity: Insufficiently Active (12/17/2021)   Exercise Vital Sign    Days of Exercise per Week: 5 days    Minutes of Exercise per Session: 20 min  Stress: No Stress Concern Present (12/17/2021)   [place]    Feeling of Stress : Not at all  Social Connections: Moderately Integrated (12/17/2021)   Social Connection and Isolation Panel [NHANES]    Frequency of Communication with Friends and Family: More than three times a week    Frequency of Social Gatherings with Friends and Family: More than three times a week    Attends Religious Services: More than 4 times per year    Active Member of [employer] or Organizations: Yes    Attends Archivist Meetings: More than 4 times per year    Marital Status: Widowed   Tobacco  Counseling Counseling given: Not Answered  Clinical Intake: Pre-visit preparation completed: Yes        Diabetes: No  How often do you need to have someone help you when you read instructions, pamphlets, or other written materials from your doctor or pharmacy?: 2 - Rarely  Interpreter Needed?: No    Activities of Daily Living    12/17/2021    3:08 PM  In your present state of health, do you have any difficulty performing the following activities:  Hearing? 1  Comment Hearing aids  Vision? 0  Difficulty concentrating or making decisions? 0  Walking or climbing stairs? 1  Comment Walker in use  Dressing or bathing? 1  Comment Aide assist as needed  Doing errands, shopping? 1  Comment Family [place] and eating ? Y  Comment Family assist with meal prep. Self feeds.  Using the Toilet? N  In the past six months, have you accidently leaked urine? Y  Comment Managed with medication. Daily brief worn.  Do you have problems with loss of bowel control? N  Managing your Medications? Y  Comment Daughter assist  Managing your Finances? Y  Comment Daughter assist  Housekeeping or managing your Housekeeping? Y  Comment Maid assist   Patient Care Team: [name], MD as PCP - General (Internal Medicine) Wellington Hampshire, MD as PCP - Cardiology (Cardiology) [name], MD (Internal Medicine) Hessie Knows, MD as Consulting Physician (Orthopedic Surgery)  Indicate any recent Medical Services you may have received from other than [facility] providers in the past year (date may be approximate).     Assessment:   This is a routine wellness examination for Jenna Marshall.  Virtual Visit via Telephone Note  I connected with  Jenna Marshall on 12/17/21 at  3:00 PM EDT by telephone and verified that I am speaking with the correct person using two identifiers.  Persons participating in the virtual visit: patient/Nurse Health Advisor   I discussed the limitations of  performing an evaluation and management service by telehealth. We continued and completed visit with audio only. Some vital signs may be absent or patient reported.  Hearing/Vision screen Hearing Screening - Comments:: Hearing aid, bilateral Vision Screening - Comments:: Wears corrective lenses  Macular degeneration  They have seen their ophthalmologist every 10 weeks.   Dietary issues and exercise activities discussed: Current Exercise Habits: Home exercise routine, Type of exercise: walking, Intensity: Mild   Goals Addressed             This Visit's Progress    Follow up with Primary Care Provider       Keep all routine maintenance appointments. Follow up as needed.       Depression Screen    12/17/2021    3:17 PM 07/26/2021    8:09 AM 12/13/2020   12:38 PM 08/30/2020    4:34 PM 07/28/2020    2:01 PM 12/13/2019    1:10 PM 12/10/2018   10:41 AM  PHQ 2/9 Scores  PHQ - 2 Score 0 0 0 0 0 0 0    Fall Risk    12/17/2021    3:22 PM 07/26/2021    8:09 AM 12/13/2020   12:43 PM 08/30/2020    3:54 PM 07/28/2020    2:01 PM  Fall Risk   Falls in the past year? 0 1 0 1 0  Number falls in past yr:  1 0 1 0  Comment    2 falls in the last year, last fall 3 weeks ago   Injury with Fall?   0 0 0  Risk for fall due to : Impaired balance/gait History of fall(s);Impaired mobility;Impaired balance/gait Impaired balance/gait History of fall(s);Medication side effect;Impaired balance/gait;Impaired mobility;Impaired vision   Risk for fall due to: Comment Walker      Follow up Falls evaluation completed Falls evaluation completed Falls evaluation completed Falls evaluation completed;Education provided;Falls prevention discussed Falls evaluation completed    FALL RISK PREVENTION PERTAINING TO THE HOME: Home free of loose throw rugs in walkways, pet beds, electrical cords, etc? Yes  Adequate lighting in your home to reduce risk of falls? Yes   ASSISTIVE DEVICES UTILIZED TO PREVENT FALLS: Use  of a cane, walker or w/c? Yes  TIMED UP AND GO: Was the test performed? No .   Cognitive Function:  Patient is alert and oriented x3.       12/13/2020    1:06 PM 12/13/2019    1:29 PM 12/10/2018   10:48 AM 09/17/2017   10:42 AM 09/04/2016    4:42 PM  6CIT Screen  What Year? 0 points 0 points 0 points 0 points 0 points  What month? 0 points 0 points 0 points 0 points 0 points  What time? 0 points  0 points 0 points 0 points  Count back from 20 0 points  0 points 0 points 0 points  Months in reverse  0 points 0 points 0 points 0 points  Repeat phrase    0 points 0 points  Total Score    0 points 0 points    Immunizations Immunization History  Administered Date(s) Administered   Influenza Split 02/27/2011, 03/04/2012   Influenza, High Dose Seasonal PF 03/31/2013, 02/24/2018, 02/24/2019   Influenza,inj,Quad PF,6+ Mos 03/30/2014   Influenza-Unspecified 02/06/2016, 03/05/2017, 03/03/2020, 03/03/2021   PFIZER(Purple Top)SARS-COV-2 Vaccination 06/25/2019, 07/16/2019, 03/03/2020   Pneumococcal Conjugate-13 03/30/2014   Pneumococcal Polysaccharide-23 03/10/2009, 04/19/2015   Tdap 10/06/2013   Zoster Recombinat (Shingrix) 06/25/2018, 12/31/2018   Zoster, Live 03/11/2011   Screening Tests Health Maintenance  Topic Date Due   COVID-19 Vaccine (4 - Booster for [place] series) 01/02/2022 (Originally 04/28/2020)  INFLUENZA VACCINE  01/01/2022   TETANUS/TDAP  10/07/2023   Pneumonia Vaccine 89+ Years old  Completed   DEXA SCAN  Completed   Zoster Vaccines- Shingrix  Completed   HPV VACCINES  Aged Out   Health Maintenance There are no preventive care reminders to display for this patient.  Lung Cancer Screening: (Low Dose CT Chest recommended if Age 18-80 years, 30 pack-year currently smoking OR have quit w/in 15years.) does not qualify.   Vision Screening: Recommended annual ophthalmology exams for early detection of glaucoma and other disorders of the eye.  Dental Screening:  Recommended annual dental exams for proper oral hygiene  Community Resource Referral / Chronic Care Management: CRR required this visit?  No   CCM required this visit?  No      Plan:   Keep all routine maintenance appointments.   I have personally reviewed and noted the following in the patient's chart:   Medical and social history Use of alcohol, tobacco or illicit drugs  Current medications and supplements including opioid prescriptions.  Functional ability and status Nutritional status Physical activity Advanced directives List of other physicians Hospitalizations, surgeries, and ER visits in previous 12 months Vitals Screenings to include cognitive, depression, and falls Referrals and appointments  In addition, I have reviewed and discussed with patient certain preventive protocols, quality metrics, and best practice recommendations. A written personalized care plan for preventive services as well as general preventive health recommendations were provided to patient.     OBrien-Blaney, Gennette Shadix L, LPN   3/[id number]/4403    I have reviewed the above information and agree with above.   Deborra Medina, MD

## 2021-12-17 NOTE — Patient Instructions (Addendum)
  Ms. Blakley , Thank you for taking time to come for your Medicare Wellness Visit. I appreciate your ongoing commitment to your health goals. Please review the following plan we discussed and let me know if I can assist you in the future.   These are the goals we discussed:  Goals      Follow up with Primary Care Provider     Keep all routine maintenance appointments. Follow up as needed.        This is a list of the screening recommended for you and due dates:  Health Maintenance  Topic Date Due   COVID-19 Vaccine (4 - Booster for Pfizer series) 01/02/2022*   Flu Shot  01/01/2022   Tetanus Vaccine  10/07/2023   Pneumonia Vaccine  Completed   DEXA scan (bone density measurement)  Completed   Zoster (Shingles) Vaccine  Completed   HPV Vaccine  Aged Out  *Topic was postponed. The date shown is not the original due date.

## 2021-12-17 NOTE — Patient Instructions (Signed)
Medication Instructions:  - Your physician recommends that you continue on your current medications as directed. Please refer to the Current Medication list given to you today.  *If you need a refill on your cardiac medications before your next appointment, please call your pharmacy*   Lab Work: - As scheduled today: [place] Entrance at [facility] 1st desk on the right to check in (REGISTRATION)  Lab hours: Monday- Friday (7:30 am- 5:30 pm)  If you have labs (blood work) drawn today and your tests are completely normal, you will receive your results only by: MyChart Message (if you have MyChart) OR A paper copy in the mail If you have any lab test that is abnormal or we need to change your treatment, we will call you to review the results.   Testing/Procedures: - none ordered   Follow-Up: At [facility], you and your health needs are our priority.  As part of our continuing mission to provide you with exceptional heart care, we have created designated Provider Care Teams.  These Care Teams include your primary Cardiologist (physician) and Advanced Practice Providers (APPs -  Physician Assistants and Nurse Practitioners) who all work together to provide you with the care you need, when you need it.  We recommend signing up for the patient portal called "MyChart".  Sign up information is provided on this After Visit Summary.  MyChart is used to connect with patients for Virtual Visits (Telemedicine).  Patients are able to view lab/test results, encounter notes, upcoming appointments, etc.  Non-urgent messages can be sent to your provider as well.   To learn more about what you can do with MyChart, go to [email or web address].    Your next appointment:   As scheduled   The format for your next appointment:   In Person  Provider:   Murray Hodgkins, NP    Other Instructions - Elevate lower extremities when sitting for extended periods of time.   Important Information  About Sugar

## 2021-12-17 NOTE — Progress Notes (Signed)
   Nurse Visit   Date of Encounter: 12/17/2021 ID: [name], DOB [birth year], MRN [id number]  PCP:  [name], MD   [facility] Providers Cardiologist:  [name], MD      Visit Details   VS:  BP (!) 146/70 (BP Location: Left Arm, Patient Position: Sitting, Cuff Size: Large)   Pulse 68   Ht '5\' 2"'$  (1.575 m)   Wt 156 lb 8 oz (71 kg)   BMI 28.62 kg/m  , BMI Body mass index is 28.62 kg/m.  Wt Readings from Last 3 Encounters:  12/17/21 156 lb 8 oz (71 kg)  12/11/21 156 lb 12.8 oz (71.1 kg)  08/14/21 160 lb (72.6 kg)     Reason for visit: Blood pressure check  Performed today: Vital Signs (BP/ HR/ Weight), Provider Consulted - Patient in office today to follow up on her blood pressure after the addition of losartan 50 mg once daily. The patient started the this on 12/15/21. - Home BP readings: 12/11/21- 140/56 12/12/21- 130/80 12/13/21- (started losartan 50 mg once daily) 154/80 (~1.5 hours after meds) 12/14/21- no reading 12/15/21- 138/78 (2 hours after meds) 12/16/21- 168/70 (1.5 hours after meds) - In office reading & home reading reviewed with Ignacia Bayley, NP.   Changes (medications, testing, etc.) : No changes- per Ignacia Bayley, NP continue current medications with goal SBP being ~ 140.  - The patient and her daughter were advised to continue to monitor BP readings ~ 3-4 times a week and let us know if the SBP is consistently running >140, otherwise, will allow to run around 140 so as to not increase fall risk due to hypotension. - The patient has minimal ankle swelling today and has been advised to elevate her lower extremities with extended periods of sitting.  - She will go to the [place] lab today to have her already scheduled BMP done.  Length of Visit: 15 minutes    Medications Adjustments/Labs and Tests Ordered: No orders of the defined types were placed in this encounter.  No orders of the defined types were placed in this  encounter.    Signed, Alvis Lemmings, RN  12/17/2021 11:08 AM

## 2021-12-19 DIAGNOSIS — H353211 Exudative age-related macular degeneration, right eye, with active choroidal neovascularization: Secondary | ICD-10-CM | POA: Diagnosis not present

## 2022-01-07 ENCOUNTER — Other Ambulatory Visit: Payer: Self-pay | Admitting: Internal Medicine

## 2022-01-24 ENCOUNTER — Encounter: Payer: Self-pay | Admitting: Internal Medicine

## 2022-01-25 ENCOUNTER — Ambulatory Visit (INDEPENDENT_AMBULATORY_CARE_PROVIDER_SITE_OTHER): Payer: Medicare HMO | Admitting: Internal Medicine

## 2022-01-25 VITALS — BP 158/69 | HR 64 | Temp 98.0°F | Ht 62.0 in | Wt 160.2 lb

## 2022-01-25 DIAGNOSIS — D509 Iron deficiency anemia, unspecified: Secondary | ICD-10-CM | POA: Diagnosis not present

## 2022-01-25 DIAGNOSIS — I2510 Atherosclerotic heart disease of native coronary artery without angina pectoris: Secondary | ICD-10-CM | POA: Diagnosis not present

## 2022-01-25 DIAGNOSIS — R7301 Impaired fasting glucose: Secondary | ICD-10-CM | POA: Diagnosis not present

## 2022-01-25 DIAGNOSIS — E034 Atrophy of thyroid (acquired): Secondary | ICD-10-CM | POA: Diagnosis not present

## 2022-01-25 DIAGNOSIS — I10 Essential (primary) hypertension: Secondary | ICD-10-CM

## 2022-01-25 DIAGNOSIS — N3941 Urge incontinence: Secondary | ICD-10-CM | POA: Diagnosis not present

## 2022-01-25 DIAGNOSIS — E785 Hyperlipidemia, unspecified: Secondary | ICD-10-CM

## 2022-01-25 DIAGNOSIS — Z79899 Other long term (current) drug therapy: Secondary | ICD-10-CM | POA: Diagnosis not present

## 2022-01-25 DIAGNOSIS — K219 Gastro-esophageal reflux disease without esophagitis: Secondary | ICD-10-CM | POA: Diagnosis not present

## 2022-01-25 LAB — LIPID PANEL
Cholesterol: 118 mg/dL (ref 0–200)
HDL: 51.7 mg/dL (ref >39.00)
LDL Cholesterol: 56 mg/dL (ref 0–99)
NonHDL: 66.35
Total CHOL/HDL Ratio: 2
Triglycerides: 52 mg/dL (ref 0.0–149.0)
VLDL: 10.4 mg/dL (ref 0.0–40.0)

## 2022-01-25 LAB — CBC WITH DIFFERENTIAL/PLATELET
Basophils Absolute: 0.1 10*3/uL (ref 0.0–0.1)
Basophils Relative: 1 % (ref 0.0–3.0)
Eosinophils Absolute: 0.2 10*3/uL (ref 0.0–0.7)
Eosinophils Relative: 3.7 % (ref 0.0–5.0)
HCT: 27.2 % — ABNORMAL LOW (ref 36.0–46.0)
Hemoglobin: 9.1 g/dL — ABNORMAL LOW (ref 12.0–15.0)
Lymphocytes Relative: 15 % (ref 12.0–46.0)
Lymphs Abs: 0.9 10*3/uL (ref 0.7–4.0)
MCHC: 33.6 g/dL (ref 30.0–36.0)
MCV: 84.4 fl (ref 78.0–100.0)
Monocytes Absolute: 0.8 10*3/uL (ref 0.1–1.0)
Monocytes Relative: 13.8 % — ABNORMAL HIGH (ref 3.0–12.0)
Neutro Abs: 4 10*3/uL (ref 1.4–7.7)
Neutrophils Relative %: 66.5 % (ref 43.0–77.0)
Platelets: 174 10*3/uL (ref 150.0–400.0)
RBC: 3.22 Mil/uL — ABNORMAL LOW (ref 3.87–5.11)
RDW: 17.7 % — ABNORMAL HIGH (ref 11.5–15.5)
WBC: 5.9 10*3/uL (ref 4.0–10.5)

## 2022-01-25 LAB — MICROALBUMIN / CREATININE URINE RATIO
Creatinine,U: 62.7 mg/dL
Microalb Creat Ratio: 1.1 mg/g (ref 0.0–30.0)
Microalb, Ur: <0.7 mg/dL (ref 0.0–1.9)

## 2022-01-25 LAB — COMPREHENSIVE METABOLIC PANEL
ALT: 8 U/L (ref 0–35)
AST: 14 U/L (ref 0–37)
Albumin: 3.8 g/dL (ref 3.5–5.2)
Alkaline Phosphatase: 67 U/L (ref 39–117)
BUN: 21 mg/dL (ref 6–23)
CO2: 30 mEq/L (ref 19–32)
Calcium: 8.8 mg/dL (ref 8.4–10.5)
Chloride: 97 mEq/L (ref 96–112)
Creatinine, Ser: 0.81 mg/dL (ref 0.40–1.20)
GFR: 62.27 mL/min (ref >60.00)
Glucose, Bld: 86 mg/dL (ref 70–99)
Potassium: 4.8 mEq/L (ref 3.5–5.1)
Sodium: 133 mEq/L — ABNORMAL LOW (ref 135–145)
Total Bilirubin: 0.4 mg/dL (ref 0.2–1.2)
Total Protein: 6.3 g/dL (ref 6.0–8.3)

## 2022-01-25 LAB — TSH: TSH: 3.32 u[IU]/mL (ref 0.35–5.50)

## 2022-01-25 LAB — HEMOGLOBIN A1C: Hgb A1c MFr Bld: 6 % (ref 4.6–6.5)

## 2022-01-25 LAB — IBC + FERRITIN
Ferritin: 11.3 ng/mL (ref 10.0–291.0)
Iron: 23 ug/dL — ABNORMAL LOW (ref 42–145)
Saturation Ratios: 7.3 % — ABNORMAL LOW (ref 20.0–50.0)
TIBC: 313.6 ug/dL (ref 250.0–450.0)
Transferrin: 224 mg/dL (ref 212.0–360.0)

## 2022-01-25 LAB — LDL CHOLESTEROL, DIRECT: Direct LDL: 55 mg/dL

## 2022-01-25 MED ORDER — PANTOPRAZOLE SODIUM 40 MG PO TBEC: 40.0000 mg | DELAYED_RELEASE_TABLET | Freq: Every day | ORAL | 3 refills | Status: DC

## 2022-01-25 NOTE — Patient Instructions (Signed)
You are doing great!   Your blood pressure is fine  However,   You  should  monitor  Your weight daily and take a dose  of furosemide  if your overnight weight gain is 2 lbs,  or your weekly weekly weight gain is 5 lbs.     If your iron stores are normal,  I will prescribe a medication for your restless legs

## 2022-01-25 NOTE — Assessment & Plan Note (Signed)
Managed with frequence changes of underwear and desitin cream

## 2022-01-25 NOTE — Progress Notes (Unsigned)
Subjective:  Patient ID: Jenna Marshall, female    DOB: 20-Jan-1926  Age: 88 y.o. MRN: [id number]  CC: The primary encounter diagnosis was Essential hypertension. Diagnoses of Hypothyroidism due to acquired atrophy of thyroid, Hyperlipidemia, unspecified hyperlipidemia type, Long-term use of high-risk medication, and Impaired fasting glucose were also pertinent to this visit.   HPI Jenna Marshall presents for  Chief Complaint  Patient presents with   Follow-up    6 month follow up    1) HTN:  taking carvedilol 3.125 mg bid and losartan  was increased to 50 mg daily  a months her HOME READINGS  have been checked regularly on a manual machine .    2) restless legs . Has not taken iron in over a year.  Did Slow Iron .  Symptoms NOT occurring at night,  only during the day when she sits down   3) breakfast today was 2 small waffles  Outpatient Medications Prior to Visit  Medication Sig Dispense Refill   acetaminophen (TYLENOL) 325 MG tablet Take 2 tablets (650 mg total) by mouth every 6 (six) hours as needed for mild pain.     aspirin 81 MG chewable tablet Chew 1 tablet (81 mg total) by mouth daily. 30 tablet 0   atorvastatin (LIPITOR) 40 MG tablet TAKE 1 TABLET(40 MG) BY MOUTH DAILY 90 tablet 3   Calcium Carbonate-Vitamin D 600-200 MG-UNIT TABS Take 1 tablet by mouth 2 (two) times daily.     carvedilol (COREG) 3.125 MG tablet Take 1 tablet (3.125 mg total) by mouth 2 (two) times daily with a meal. 180 tablet 3   levothyroxine (SYNTHROID) 25 MCG tablet Take 1 tablet (25 mcg total) by mouth daily before breakfast. 90 tablet 3   losartan (COZAAR) 50 MG tablet Take 1 tablet (50 mg total) by mouth daily. 90 tablet 0   Multiple Vitamin (MULTIVITAMIN WITH MINERALS) TABS tablet Take 1 tablet by mouth daily.     Multiple Vitamins-Minerals (PRESERVISION AREDS 2) CAPS Take 1 capsule by mouth 2 (two) times daily.      MYRBETRIQ 25 MG TB24 tablet TAKE 1 TABLET BY MOUTH EVERY DAY 30 tablet 5    pantoprazole (PROTONIX) 40 MG tablet TAKE ONE TABLET BY MOUTH ONCE DAILY 90 tablet 1   senna-docusate (SENOKOT-S) 8.6-50 MG per tablet Take 1 tablet by mouth 2 (two) times daily. 30 tablet 0   torsemide (DEMADEX) 10 MG tablet Take 1 tablet daily as needed for shortness of breath or weight gain 3 lbs or greater overnight.     No facility-administered medications prior to visit.    Review of Systems;  Patient denies headache, fevers, malaise, unintentional weight loss, skin rash, eye pain, sinus congestion and sinus pain, sore throat, dysphagia,  hemoptysis , cough, dyspnea, wheezing, chest pain, palpitations, orthopnea, edema, abdominal pain, nausea, melena, diarrhea, constipation, flank pain, dysuria, hematuria, urinary  Frequency, nocturia, numbness, tingling, seizures,  Focal weakness, Loss of consciousness,  Tremor, insomnia, depression, anxiety, and suicidal ideation.      Objective:  There were no vitals taken for this visit.  BP Readings from Last 3 Encounters:  12/17/21 (!) 146/70  12/11/21 (!) 188/96  08/14/21 124/70    Wt Readings from Last 3 Encounters:  12/17/21 156 lb (70.8 kg)  12/17/21 156 lb 8 oz (71 kg)  12/11/21 156 lb 12.8 oz (71.1 kg)    General appearance: alert, cooperative and appears stated age Ears: normal TM's and external ear canals both ears Throat:  lips, mucosa, and tongue normal; teeth and gums normal Neck: no adenopathy, no carotid bruit, supple, symmetrical, trachea midline and thyroid not enlarged, symmetric, no tenderness/mass/nodules Back: symmetric, no curvature. ROM normal. No CVA tenderness. Lungs: clear to auscultation bilaterally Heart: regular rate and rhythm, S1, S2 normal, no murmur, click, rub or gallop Abdomen: soft, non-tender; bowel sounds normal; no masses,  no organomegaly Pulses: 2+ and symmetric Skin: Skin color, texture, turgor normal. No rashes or lesions Lymph nodes: Cervical, supraclavicular, and axillary nodes normal.  No  results found for: "HGBA1C"  Lab Results  Component Value Date   CREATININE 0.70 12/17/2021   CREATININE 0.84 07/26/2021   CREATININE 0.83 01/12/2021    Lab Results  Component Value Date   WBC 8.3 07/26/2021   HGB 12.0 07/26/2021   HCT 35.9 (L) 07/26/2021   PLT 179.0 07/26/2021   GLUCOSE 104 (H) 12/17/2021   CHOL 135 07/26/2021   TRIG 88.0 07/26/2021   HDL 55.60 07/26/2021   LDLDIRECT 104.4 02/27/2011   LDLCALC 62 07/26/2021   ALT 13 07/26/2021   AST 19 07/26/2021   NA 134 (L) 12/17/2021   K 4.4 12/17/2021   CL 101 12/17/2021   CREATININE 0.70 12/17/2021   BUN 23 12/17/2021   CO2 28 12/17/2021   TSH 4.08 07/26/2021   INR 1.64 (H) 10/14/2014    No results found.  Assessment & Plan:   Problem List Items Addressed This Visit     Essential hypertension - Primary (Chronic)   Hyperlipidemia (Chronic)   Hypothyroidism (Chronic)   Long-term use of high-risk medication   Other Visit Diagnoses     Impaired fasting glucose           I spent a total of   minutes with this patient in a face to face visit on the date of this encounter reviewing the last office visit with me on        ,  most recent with patient's cardiologist in    ,  patient'ss diet and eating habits, home blood pressure readings ,  most recent imaging study ,   and post visit ordering of testing and therapeutics.    Follow-up: No follow-ups on file.   [name], MD

## 2022-01-25 NOTE — Assessment & Plan Note (Signed)
She has a touch of white coat hypertension .  No changes to regimen  ,  As home readings on a manual cuff are 120/70 ballpark.

## 2022-01-26 MED ORDER — SLOW RELEASE IRON 45 MG PO TBCR: 1.0000 | EXTENDED_RELEASE_TABLET | Freq: Every day | ORAL | 1 refills | Status: DC

## 2022-01-26 NOTE — Assessment & Plan Note (Signed)
She is asymptomatic with ADLs.  Continue regular follow up with cardiology ,  Continue aspirin, beta blocker , ARB and statin  

## 2022-01-26 NOTE — Assessment & Plan Note (Signed)
Thyroid function is WNL on current dose.  No current changes needed.   Lab Results  Component Value Date   TSH 3.32 01/25/2022

## 2022-01-26 NOTE — Assessment & Plan Note (Signed)
Recurrent ,  With no invasive workup given her age.  Will resume iron

## 2022-01-26 NOTE — Assessment & Plan Note (Signed)
MANAGED WITH DAILY USE OF PROTONIX.  No changes today

## 2022-02-21 ENCOUNTER — Ambulatory Visit: Payer: Medicare HMO | Admitting: Nurse Practitioner

## 2022-03-13 DIAGNOSIS — H43813 Vitreous degeneration, bilateral: Secondary | ICD-10-CM | POA: Diagnosis not present

## 2022-03-13 DIAGNOSIS — H353211 Exudative age-related macular degeneration, right eye, with active choroidal neovascularization: Secondary | ICD-10-CM | POA: Diagnosis not present

## 2022-04-29 ENCOUNTER — Telehealth: Payer: Self-pay | Admitting: Internal Medicine

## 2022-04-29 DIAGNOSIS — D509 Iron deficiency anemia, unspecified: Secondary | ICD-10-CM

## 2022-04-29 NOTE — Telephone Encounter (Signed)
Patient's daughter called to schedule lab appointment to have her iron rechecked. No orders, does she need her iron checked?

## 2022-04-29 NOTE — Addendum Note (Signed)
Addended by: Adair Laundry on: 04/29/2022 05:13 PM   Modules accepted: Orders

## 2022-04-29 NOTE — Addendum Note (Signed)
Addended by: [name] on: 04/29/2022 09:18 PM   Modules accepted: Orders

## 2022-04-29 NOTE — Telephone Encounter (Signed)
I have pended the labs for your approval.

## 2022-05-15 NOTE — Telephone Encounter (Signed)
MyChart messgae sent to patient. 

## 2022-05-31 ENCOUNTER — Other Ambulatory Visit: Payer: Self-pay

## 2022-06-06 ENCOUNTER — Other Ambulatory Visit: Payer: Self-pay | Admitting: Internal Medicine

## 2022-06-07 DIAGNOSIS — H353211 Exudative age-related macular degeneration, right eye, with active choroidal neovascularization: Secondary | ICD-10-CM | POA: Diagnosis not present

## 2022-06-10 ENCOUNTER — Encounter: Payer: Self-pay | Admitting: Medical

## 2022-06-10 ENCOUNTER — Ambulatory Visit: Payer: Medicare HMO | Attending: Medical | Admitting: Medical

## 2022-06-10 VITALS — BP 181/68 | HR 66 | Ht 61.0 in | Wt 157.4 lb

## 2022-06-10 DIAGNOSIS — I2510 Atherosclerotic heart disease of native coronary artery without angina pectoris: Secondary | ICD-10-CM

## 2022-06-10 DIAGNOSIS — I10 Essential (primary) hypertension: Secondary | ICD-10-CM

## 2022-06-10 DIAGNOSIS — E782 Mixed hyperlipidemia: Secondary | ICD-10-CM

## 2022-06-10 DIAGNOSIS — I5032 Chronic diastolic (congestive) heart failure: Secondary | ICD-10-CM

## 2022-06-10 DIAGNOSIS — I38 Endocarditis, valve unspecified: Secondary | ICD-10-CM

## 2022-06-10 MED ORDER — LOSARTAN POTASSIUM 50 MG PO TABS: 75.0000 mg | ORAL_TABLET | Freq: Every day | ORAL | 0 refills | Status: DC

## 2022-06-10 NOTE — Progress Notes (Signed)
Cardiology Office Note:    Date:  06/10/2022   ID:  [name], DOB 26-Apr-1926, MRN [id number]  PCP:  [name], MD  [facility] Cardiologist:  [name], MD  [facility] Electrophysiologist:  None   Referring MD: [name], MD   Chief Complaint: BP follow-up  History of Present Illness:    Jenna Marshall is a 89 y.o. female with a hx of CAD, HTN, HLD, GERD, hypothyroidism, diastolic dysfunction and Aortic insufficiency, depression and arthritis.   In May 2016, she underwent stress testing in the setting of chest discomfort, which was abnormal. This was followed by diagnostic catheterization, which showed severe left main and ostial LAD disease. She subsequently underwent CABGx4. 3 months after bypass surgery, she fractured her left hip and required left hip hemiarthoplasty. She subsequently recovered well. Most recent echo in September 2020 showed an EF 55-60% with severe LVH, diastolic dysfunction, elevated RVSP of 58mHg, moderate AI, mild to mod MR and moderate TR.   [facility]. She was subsequently discharged to skilled rehab secondary to ongoing weakness and later discharged home in February 2023.   The patient was last seen 12/2021 and BP was high, but reportedly has white coat syndrome. One week BP check was recommended, but patient didn't follow-up.   Today, the patient is overall doing well. BP is high, on re-check BP 170/68. Home BP log shows BP 140-160/70-80s. The patient uses a rollator at all times. No recent falls. The patient denies chest pain, SOB, lower leg edema, orthopnea or pnd.  Past Medical History:  Diagnosis Date   Aortic insufficiency    a. 03/2017 Echo: Mod AI; b. 02/2019 Echo: Mod AI.   Arthritis    CAD (coronary artery disease)    a. Lexiscan 10/13/14: mid anterior to apical & inf wall ischemia w/ WMA, mild to mod dep EF; b. Cath 10/2014: LM 50ost, LM 99, ost LAD 95,  40p/m, RCA 30p, 724mb. 10/2014 CABG x 4 (LIMA->LAD, VG->RCA, VG->RI->OM).   Depression    Diastolic dysfunction    a. 10/2014 Echo: EF 55-60%, no RWMA, GR1DD; b. 03/2017 Echo: EF 60-65%, mild LVH, Gr2 DD; c. 02/2019 Echo: EF 55-60%, sev LVH. Diast dysfxn. No rwma. RVSP 41.79m77m. Mildly dil LA. Mild to mod MR. Mod TR. Mod AI.   GERD (gastroesophageal reflux disease)    Hyperlipidemia    Hypertension    Hypothyroidism    Macular degeneration    pernicious anemia    Varicose veins    Wears dentures    partial upper   Wears hearing aid    right    Past Surgical History:  Procedure Laterality Date   ANTERIOR VITRECTOMY Right 07/16/2016   Procedure: ANTERIOR VITRECTOMY;  Surgeon: BraEulogio BearD;  [facility];  Laterality: Right;   CARDIAC CATHETERIZATION Left 10/14/2014   Procedure: Left Heart Cath and Coronary Angiography;  Surgeon: MuhWellington HampshireD;  [facility];  Service: Cardiovascular;  Laterality: Left;   CATARACT EXTRACTION W/PHACO Left 04/30/2016   Procedure: CATARACT EXTRACTION PHACO AND INTRAOCULAR LENS PLACEMENT (IOC);  Surgeon: BraEulogio BearD;  [facility];  Laterality: Left;  LEFT   CATARACT EXTRACTION W/PHACO Right 07/16/2016   Procedure: CATARACT EXTRACTION PHACO AND INTRAOCULAR LENS PLACEMENT (IOCBratenahlRight;  Surgeon: BraEulogio BearD;  [facility];  Service: Ophthalmology;  Laterality: Right;   CORONARY ARTERY BYPASS GRAFT N/A 10/14/2014   Procedure: CORONARY ARTERY BYPASS GRAFTING (CABG), ON PUMP, TIMES FOUR, USING LEFT INTERNAL MAMMARY ARTERY, RIGHT GREATER SAPHENOUS VEIN HARVESTED ENDOSCOPICALLY;  Surgeon: Gaye Pollack, MD;  Location: [place];  Service: Open Heart Surgery;  Laterality: N/A;   CORONARY ARTERY BYPASS GRAFT  10/2014   LIMA-->LAD, SVG-->RCA, sequential SVG-->Ramus and OM   HEMORRHOID SURGERY     HIP ARTHROPLASTY Left  01/14/2015   Procedure: ARTHROPLASTY BIPOLAR HIP (HEMIARTHROPLASTY);  Surgeon: Dereck Leep, MD;  Location: [facility];  Service: Orthopedics;  Laterality: Left;   VARICOSE VEIN SURGERY      Current Medications: Current Meds  Medication Sig   acetaminophen (TYLENOL) 325 MG tablet Take 2 tablets (650 mg total) by mouth every 6 (six) hours as needed for mild pain.   aspirin 81 MG chewable tablet Chew 1 tablet (81 mg total) by mouth daily.   atorvastatin (LIPITOR) 40 MG tablet TAKE 1 TABLET(40 MG) BY MOUTH DAILY   Calcium Carbonate-Vitamin D 600-200 MG-UNIT TABS Take 1 tablet by mouth 2 (two) times daily.   carvedilol (COREG) 3.125 MG tablet Take 1 tablet (3.125 mg total) by mouth 2 (two) times daily with a meal.   Ferrous Sulfate Dried (SLOW RELEASE IRON) 45 MG TBCR Take 1 tablet by mouth daily with breakfast.   levothyroxine (SYNTHROID) 25 MCG tablet Take 1 tablet (25 mcg total) by mouth daily before breakfast.   Multiple Vitamin (MULTIVITAMIN WITH MINERALS) TABS tablet Take 1 tablet by mouth daily.   Multiple Vitamins-Minerals (PRESERVISION AREDS 2) CAPS Take 1 capsule by mouth 2 (two) times daily.    MYRBETRIQ 25 MG TB24 tablet TAKE 1 TABLET BY MOUTH EVERY DAY   pantoprazole (PROTONIX) 40 MG tablet Take 1 tablet (40 mg total) by mouth daily.   senna-docusate (SENOKOT-S) 8.6-50 MG per tablet Take 1 tablet by mouth 2 (two) times daily.   torsemide (DEMADEX) 10 MG tablet Take 1 tablet daily as needed for shortness of breath or weight gain 3 lbs or greater overnight.   [DISCONTINUED] losartan (COZAAR) 50 MG tablet TAKE 1 TABLET(50 MG) BY MOUTH DAILY     Allergies:   Ambien [zolpidem], Codeine, and Lasix [furosemide]   Social History   Socioeconomic History   Marital status: Widowed    Spouse name: Not on file   Number of children: Not on file   Years of education: Not on file   Highest education level: Not on file  Occupational History   Not on file  Tobacco Use   Smoking status:  Never   Smokeless tobacco: Never  Vaping Use   Vaping Use: Never used  Substance and Sexual Activity   Alcohol use: No   Drug use: No   Sexual activity: Never  Other Topics Concern   Not on file  Social History Narrative   ** Merged History Encounter **       Social Determinants of Health   Financial Resource Strain: Low Risk  (12/17/2021)   Overall Financial Resource Strain (CARDIA)    Difficulty of Paying Living Expenses: Not hard at all  Food Insecurity: No Food Insecurity (12/17/2021)   Hunger Vital Sign    Worried About Running Out of Food in the Last Year: Never true    Ran Out of Food in the Last Year: Never true  Transportation Needs: No Transportation Needs (12/17/2021)   PRAPARE - Transportation    Lack of Transportation (  Medical): No    Lack of Transportation (Non-Medical): No  Physical Activity: Insufficiently Active (12/17/2021)   Exercise Vital Sign    Days of Exercise per Week: 5 days    Minutes of Exercise per Session: 20 min  Stress: No Stress Concern Present (12/17/2021)   [place]    Feeling of Stress : Not at all  Social Connections: Moderately Integrated (12/17/2021)   Social Connection and Isolation Panel [NHANES]    Frequency of Communication with Friends and Family: More than three times a week    Frequency of Social Gatherings with Friends and Family: More than three times a week    Attends Religious Services: More than 4 times per year    Active Member of [employer] or Organizations: Yes    Attends Archivist Meetings: More than 4 times per year    Marital Status: Widowed     Family History: The patient's family history includes Coronary artery disease in her brother and mother; Diabetes in her mother; Heart disease in her father. There is no history of Breast cancer.  ROS:   Please see the history of present illness.     All other systems reviewed and are  negative.  EKGs/Labs/Other Studies Reviewed:    The following studies were reviewed today:  Echo 2020  1. The left ventricle has normal systolic function, with an ejection  fraction of 55-60%. The cavity size was normal. There is severely  increased left ventricular wall thickness. Left ventricular diastolic  Doppler parameters are consistent with  pseudonormalization. Elevated mean left atrial pressure No evidence of  left ventricular regional wall motion abnormalities.   2. The right ventricle has low normal systolic function. The cavity was  mildly enlarged. There is mildly increased right ventricular wall  thickness. Right ventricular systolic pressure is mildly to moderately  elevated with an estimated pressure of 41.9  mmHg.   3. Left atrial size was mildly dilated.   4. The mitral valve is degenerative. Mild thickening of the mitral valve  leaflet. There is moderate mitral annular calcification present. Mitral  valve regurgitation is mild to moderate by color flow Doppler.   5. Tricuspid valve regurgitation is moderate.   6. The aortic valve is tricuspid. Mild thickening of the aortic valve.  Aortic valve regurgitation is moderate by color flow Doppler.   7. The aorta is normal unless otherwise noted.   EKG:  EKG is ordered today.  The ekg ordered today demonstrates NSR 66bpm, LAD, TWI III  Recent Labs: 07/26/2021: Magnesium 2.0 01/25/2022: ALT 8; BUN 21; Creatinine, Ser 0.81; Hemoglobin 9.1; Platelets 174.0; Potassium 4.8; Sodium 133; TSH 3.32  Recent Lipid Panel    Component Value Date/Time   CHOL 118 01/25/2022 0958   CHOL 114 12/23/2014 0826   TRIG 52.0 01/25/2022 0958   HDL 51.70 01/25/2022 0958   HDL 40 12/23/2014 0826   CHOLHDL 2 01/25/2022 0958   VLDL 10.4 01/25/2022 0958   LDLCALC 56 01/25/2022 0958   LDLCALC 56 12/23/2014 0826   LDLDIRECT 55.0 01/25/2022 0958     Physical Exam:    VS:  BP (!) 181/68 (BP Location: Right Arm, Patient Position: Sitting,  Cuff Size: Normal)   Pulse 66   Ht '5\' 1"'$  (1.549 m)   Wt 157 lb 6.4 oz (71.4 kg)   SpO2 96%   BMI 29.74 kg/m     Wt Readings from Last 3 Encounters:  06/10/22 157 lb 6.4  oz (71.4 kg)  01/25/22 160 lb 3.2 oz (72.7 kg)  12/17/21 156 lb (70.8 kg)     GEN:  Well nourished, well developed in no acute distress HEENT: Normal NECK: No JVD; No carotid bruits LYMPHATICS: No lymphadenopathy CARDIAC: RRR, + murmurs, rubs, gallops RESPIRATORY:  Clear to auscultation without rales, wheezing or rhonchi  ABDOMEN: Soft, non-tender, non-distended MUSCULOSKELETAL:  trace lower leg edema; No deformity  SKIN: Warm and dry NEUROLOGIC:  Alert and oriented x 3 PSYCHIATRIC:  Normal affect   ASSESSMENT:    1. Essential hypertension   2. Coronary artery disease involving native coronary artery of native heart without angina pectoris   3. Hyperlipidemia, mixed   4. Chronic diastolic heart failure (HCC)   5. Valvular heart disease    PLAN:    In order of problems listed above:  HTN Patient reports a degree of white coat syndrome. BP on re-check 170-68. At home BP is mostly 140-160/70s, however Bps are inconsistently taken at home. The patient takes Coreg 3.'125mg'$ BID and Losartan '50mg'$  daily. Family is concerned because patient is a fall risk and want to avoid low BP, which I agree with. She uses a Rollator at home and is very careful, no recent falls. She denies any dizziness or lightheadedness. I will increase Losartan '75mg'$  daily and they will continue to monitor BP and symptoms at home.   CAD Patient denies anginal symptoms. No further work-up indicated at this time. Continue Aspirin, statin, BB and ARB therapy.   HLD LDL 62 in February with normal LFTs. Continue statin therapy.   HFpEF Trace lower leg edema on exam, which is unchanged. She takes Torsemide as needed for volume management.   Valvular heart disease Last echo in 2020 showed mild to mod MR, moderate TR and moderate AI. Can follow  with serial imaging.   Disposition: Follow up in 3 month(s) with MD     Signed, Jerriyah Louis Ninfa Meeker, PA-C  06/10/2022 4:28 PM    [facility]

## 2022-06-10 NOTE — Patient Instructions (Signed)
Medication Instructions:  INCREASE losartan to 75 mg by mouth daily.  *If you need a refill on your cardiac medications before your next appointment, please call your pharmacy*   Lab Work: No labs ordered  If you have labs (blood work) drawn today and your tests are completely normal, you will receive your results only by: [place] (if you have MyChart) OR A paper copy in the mail If you have any lab test that is abnormal or we need to change your treatment, we will call you to review the results.   Testing/Procedures: No testing ordered  Follow-Up: At [facility], you and your health needs are our priority.  As part of our continuing mission to provide you with exceptional heart care, we have created designated Provider Care Teams.  These Care Teams include your primary Cardiologist (physician) and Advanced Practice Providers (APPs -  Physician Assistants and Nurse Practitioners) who all work together to provide you with the care you need, when you need it.  We recommend signing up for the patient portal called "MyChart".  Sign up information is provided on this After Visit Summary.  MyChart is used to connect with patients for Virtual Visits (Telemedicine).  Patients are able to view lab/test results, encounter notes, upcoming appointments, etc.  Non-urgent messages can be sent to your provider as well.   To learn more about what you can do with MyChart, go to [email or web address].    Your next appointment:   3 month(s)  The format for your next appointment:   In Person  Provider:   You may see [name], MD   Important Information About Sugar

## 2022-06-28 DIAGNOSIS — H903 Sensorineural hearing loss, bilateral: Secondary | ICD-10-CM | POA: Diagnosis not present

## 2022-06-28 DIAGNOSIS — H6123 Impacted cerumen, bilateral: Secondary | ICD-10-CM | POA: Diagnosis not present

## 2022-07-05 ENCOUNTER — Ambulatory Visit (INDEPENDENT_AMBULATORY_CARE_PROVIDER_SITE_OTHER): Payer: Medicare HMO | Admitting: Internal Medicine

## 2022-07-05 ENCOUNTER — Encounter: Payer: Self-pay | Admitting: Internal Medicine

## 2022-07-05 VITALS — BP 142/80 | HR 69 | Temp 98.2°F | Ht 61.0 in | Wt 157.8 lb

## 2022-07-05 DIAGNOSIS — E785 Hyperlipidemia, unspecified: Secondary | ICD-10-CM

## 2022-07-05 DIAGNOSIS — N3941 Urge incontinence: Secondary | ICD-10-CM

## 2022-07-05 DIAGNOSIS — I2510 Atherosclerotic heart disease of native coronary artery without angina pectoris: Secondary | ICD-10-CM | POA: Diagnosis not present

## 2022-07-05 DIAGNOSIS — I10 Essential (primary) hypertension: Secondary | ICD-10-CM | POA: Diagnosis not present

## 2022-07-05 DIAGNOSIS — D509 Iron deficiency anemia, unspecified: Secondary | ICD-10-CM | POA: Diagnosis not present

## 2022-07-05 DIAGNOSIS — I5032 Chronic diastolic (congestive) heart failure: Secondary | ICD-10-CM

## 2022-07-05 DIAGNOSIS — E034 Atrophy of thyroid (acquired): Secondary | ICD-10-CM

## 2022-07-05 LAB — COMPREHENSIVE METABOLIC PANEL
ALT: 11 U/L (ref 0–35)
AST: 19 U/L (ref 0–37)
Albumin: 4.1 g/dL (ref 3.5–5.2)
Alkaline Phosphatase: 67 U/L (ref 39–117)
BUN: 26 mg/dL — ABNORMAL HIGH (ref 6–23)
CO2: 28 mEq/L (ref 19–32)
Calcium: 9.4 mg/dL (ref 8.4–10.5)
Chloride: 102 mEq/L (ref 96–112)
Creatinine, Ser: 0.8 mg/dL (ref 0.40–1.20)
GFR: 63.01 mL/min (ref >60.00)
Glucose, Bld: 102 mg/dL — ABNORMAL HIGH (ref 70–99)
Potassium: 4.7 mEq/L (ref 3.5–5.1)
Sodium: 139 mEq/L (ref 135–145)
Total Bilirubin: 0.4 mg/dL (ref 0.2–1.2)
Total Protein: 6.8 g/dL (ref 6.0–8.3)

## 2022-07-05 LAB — LDL CHOLESTEROL, DIRECT: Direct LDL: 72 mg/dL

## 2022-07-05 LAB — CBC WITH DIFFERENTIAL/PLATELET
Basophils Absolute: 0.1 10*3/uL (ref 0.0–0.1)
Basophils Relative: 1.2 % (ref 0.0–3.0)
Eosinophils Absolute: 0.2 10*3/uL (ref 0.0–0.7)
Eosinophils Relative: 3 % (ref 0.0–5.0)
HCT: 35.3 % — ABNORMAL LOW (ref 36.0–46.0)
Hemoglobin: 12 g/dL (ref 12.0–15.0)
Lymphocytes Relative: 16.4 % (ref 12.0–46.0)
Lymphs Abs: 0.9 10*3/uL (ref 0.7–4.0)
MCHC: 34 g/dL (ref 30.0–36.0)
MCV: 96.6 fl (ref 78.0–100.0)
Monocytes Absolute: 0.6 10*3/uL (ref 0.1–1.0)
Monocytes Relative: 11.1 % (ref 3.0–12.0)
Neutro Abs: 3.9 10*3/uL (ref 1.4–7.7)
Neutrophils Relative %: 68.3 % (ref 43.0–77.0)
Platelets: 163 10*3/uL (ref 150.0–400.0)
RBC: 3.66 Mil/uL — ABNORMAL LOW (ref 3.87–5.11)
RDW: 15.2 % (ref 11.5–15.5)
WBC: 5.7 10*3/uL (ref 4.0–10.5)

## 2022-07-05 LAB — IBC + FERRITIN
Ferritin: 24.7 ng/mL (ref 10.0–291.0)
Iron: 46 ug/dL (ref 42–145)
Saturation Ratios: 21.9 % (ref 20.0–50.0)
TIBC: 210 ug/dL — ABNORMAL LOW (ref 250.0–450.0)
Transferrin: 150 mg/dL — ABNORMAL LOW (ref 212.0–360.0)

## 2022-07-05 LAB — LIPID PANEL
Cholesterol: 140 mg/dL (ref 0–200)
HDL: 51.3 mg/dL (ref >39.00)
LDL Cholesterol: 73 mg/dL (ref 0–99)
NonHDL: 88.59
Total CHOL/HDL Ratio: 3
Triglycerides: 76 mg/dL (ref 0.0–149.0)
VLDL: 15.2 mg/dL (ref 0.0–40.0)

## 2022-07-05 LAB — TSH: TSH: 3.53 u[IU]/mL (ref 0.35–5.50)

## 2022-07-05 NOTE — Progress Notes (Unsigned)
Subjective:  Patient ID: Jenna Marshall, female    DOB: 1926/05/02  Age: 89 y.o. MRN: [id number]  CC: The primary encounter diagnosis was Hypothyroidism due to acquired atrophy of thyroid. Diagnoses of Hyperlipidemia, unspecified hyperlipidemia type, Essential hypertension, Iron deficiency anemia, unspecified iron deficiency anemia type, Urinary incontinence, urge, Elevated blood pressure reading with diagnosis of hypertension, Chronic diastolic heart failure ([place]), and Coronary artery disease involving native coronary artery of native heart without angina pectoris were also pertinent to this visit.   HPI Adlee Paar presents for  Chief Complaint  Patient presents with   Medical Management of Chronic Issues    6 month follow up    1) gait instabilty: she has been using a rollator,  reports no falls , still living independently   2) chronic left hip pain s/p hemi arthoplasty 2016. Not using tylenol on a regular basis to manage pain   3) IDA:   taking iron once daily. Some chronic constipation   4) HTN:  reviewed last cardiology and  ECHO :  severe LVH and diastolic dysfuxn   EF 41-93%. Losartan increased to 75 mg on jan 8   5) CAD:  severe left main and ostial LAD s.p CABG x 4 2016  6) Hypothyroid, hyperlipidemia: taking levothyroxine 25 mcg and Lipitor 40 mg daily   7) OAB  using myrbetriq .    Outpatient Medications Prior to Visit  Medication Sig Dispense Refill   acetaminophen (TYLENOL) 325 MG tablet Take 2 tablets (650 mg total) by mouth every 6 (six) hours as needed for mild pain.     aspirin 81 MG chewable tablet Chew 1 tablet (81 mg total) by mouth daily. 30 tablet 0   atorvastatin (LIPITOR) 40 MG tablet TAKE 1 TABLET(40 MG) BY MOUTH DAILY 90 tablet 3   Calcium Carbonate-Vitamin D 600-200 MG-UNIT TABS Take 1 tablet by mouth 2 (two) times daily.     carvedilol (COREG) 3.125 MG tablet Take 1 tablet (3.125 mg total) by mouth 2 (two) times daily with a meal. 180 tablet 3    Ferrous Sulfate Dried (SLOW RELEASE IRON) 45 MG TBCR Take 1 tablet by mouth daily with breakfast. 90 tablet 1   levothyroxine (SYNTHROID) 25 MCG tablet Take 1 tablet (25 mcg total) by mouth daily before breakfast. 90 tablet 3   losartan (COZAAR) 50 MG tablet Take 1.5 tablets (75 mg total) by mouth daily. 135 tablet 0   Multiple Vitamin (MULTIVITAMIN WITH MINERALS) TABS tablet Take 1 tablet by mouth daily.     Multiple Vitamins-Minerals (PRESERVISION AREDS 2) CAPS Take 1 capsule by mouth 2 (two) times daily.      MYRBETRIQ 25 MG TB24 tablet TAKE 1 TABLET BY MOUTH EVERY DAY 30 tablet 5   pantoprazole (PROTONIX) 40 MG tablet Take 1 tablet (40 mg total) by mouth daily. 90 tablet 3   senna-docusate (SENOKOT-S) 8.6-50 MG per tablet Take 1 tablet by mouth 2 (two) times daily. 30 tablet 0   torsemide (DEMADEX) 10 MG tablet Take 1 tablet daily as needed for shortness of breath or weight gain 3 lbs or greater overnight.     No facility-administered medications prior to visit.    Review of Systems;  Patient denies headache, fevers, malaise, unintentional weight loss, skin rash, eye pain, sinus congestion and sinus pain, sore throat, dysphagia,  hemoptysis , cough, dyspnea, wheezing, chest pain, palpitations, orthopnea, edema, abdominal pain, nausea, melena, diarrhea, constipation, flank pain, dysuria, hematuria, urinary  Frequency, nocturia, numbness, tingling,  seizures,  Focal weakness, Loss of consciousness,  Tremor, insomnia, depression, anxiety, and suicidal ideation.      Objective:  BP (!) 142/80   Pulse 69   Temp 98.2 F (36.8 C) (Oral)   Ht '5\' 1"'$  (1.549 m)   Wt 157 lb 12.8 oz (71.6 kg)   SpO2 95%   BMI 29.82 kg/m   BP Readings from Last 3 Encounters:  07/05/22 (!) 142/80  06/10/22 (!) 181/68  01/25/22 (!) 158/69    Wt Readings from Last 3 Encounters:  07/05/22 157 lb 12.8 oz (71.6 kg)  06/10/22 157 lb 6.4 oz (71.4 kg)  01/25/22 160 lb 3.2 oz (72.7 kg)    Physical Exam Vitals  reviewed.  Constitutional:      General: She is not in acute distress.    Appearance: Normal appearance. She is normal weight. She is not ill-appearing, toxic-appearing or diaphoretic.  HENT:     Head: Normocephalic.  Eyes:     General: No scleral icterus.       Right eye: No discharge.        Left eye: No discharge.     Conjunctiva/sclera: Conjunctivae normal.  Cardiovascular:     Rate and Rhythm: Normal rate and regular rhythm.     Heart sounds: Normal heart sounds.  Pulmonary:     Effort: Pulmonary effort is normal. No respiratory distress.     Breath sounds: Normal breath sounds.  Musculoskeletal:        General: Normal range of motion.  Skin:    General: Skin is warm and dry.  Neurological:     General: No focal deficit present.     Mental Status: She is alert and oriented to person, place, and time. Mental status is at baseline.  Psychiatric:        Mood and Affect: Mood normal.        Behavior: Behavior normal.        Thought Content: Thought content normal.        Judgment: Judgment normal.     Lab Results  Component Value Date   HGBA1C 6.0 01/25/2022    Lab Results  Component Value Date   CREATININE 0.80 07/05/2022   CREATININE 0.81 01/25/2022   CREATININE 0.70 12/17/2021    Lab Results  Component Value Date   WBC 5.7 07/05/2022   HGB 12.0 07/05/2022   HCT 35.3 (L) 07/05/2022   PLT 163.0 07/05/2022   GLUCOSE 102 (H) 07/05/2022   CHOL 140 07/05/2022   TRIG 76.0 07/05/2022   HDL 51.30 07/05/2022   LDLDIRECT 72.0 07/05/2022   LDLCALC 73 07/05/2022   ALT 11 07/05/2022   AST 19 07/05/2022   NA 139 07/05/2022   K 4.7 07/05/2022   CL 102 07/05/2022   CREATININE 0.80 07/05/2022   BUN 26 (H) 07/05/2022   CO2 28 07/05/2022   TSH 3.53 07/05/2022   INR 1.64 (H) 10/14/2014   HGBA1C 6.0 01/25/2022   MICROALBUR <0.7 01/25/2022    No results found.  Assessment & Plan:  .Hypothyroidism due to acquired atrophy of thyroid Assessment & Plan: Thyroid  function is WNL on 25 mcg levothyroxine  dose.  No current changes needed.   Lab Results  Component Value Date   TSH 3.53 07/05/2022     Orders: -     TSH  Hyperlipidemia, unspecified hyperlipidemia type Assessment & Plan: Lipids are at goal. (LDL  70).  No changes to regimen   Lab Results  Component Value Date  CHOL 140 07/05/2022   HDL 51.30 07/05/2022   LDLCALC 73 07/05/2022   LDLDIRECT 72.0 07/05/2022   TRIG 76.0 07/05/2022   CHOLHDL 3 07/05/2022     Orders: -     Lipid panel -     LDL cholesterol, direct  Essential hypertension -     Comprehensive metabolic panel  Iron deficiency anemia, unspecified iron deficiency anemia type Assessment & Plan: Resolved.  Advised to suspend iron stores  Lab Results  Component Value Date   WBC 5.7 07/05/2022   HGB 12.0 07/05/2022   HCT 35.3 (L) 07/05/2022   MCV 96.6 07/05/2022   PLT 163.0 07/05/2022   Lab Results  Component Value Date   IRON 46 07/05/2022   TIBC 210.0 (L) 07/05/2022   FERRITIN 24.7 07/05/2022     Orders: -     IBC + Ferritin -     CBC with Differential/Platelet  Urinary incontinence, urge Assessment & Plan: Managed with frequence changes of underwear and desitin cream . Treating OAB with Murbetriq   Elevated blood pressure reading with diagnosis of hypertension Assessment & Plan: Improved with higher dose of losartan  started Jan 8.  Cr and lytes unchanged. .  Continue  75 mg losartan and 3.125 mg carvedilol.   Lab Results  Component Value Date   CREATININE 0.80 07/05/2022   Lab Results  Component Value Date   NA 139 07/05/2022   K 4.7 07/05/2022   CL 102 07/05/2022   CO2 28 07/05/2022      Chronic diastolic heart failure (HCC) Assessment & Plan: using torsemide prn weight gain. BP improved with 75 mg losartan and 3.125 mg carvedilol bid    Coronary artery disease involving native coronary artery of native heart without angina pectoris Assessment & Plan: She is asymptomatic  with ADLs.  Continue regular follow up with cardiology ,  Continue aspirin, beta blocker , ARB and statin       Follow-up: No follow-ups on file.   [name], MD

## 2022-07-05 NOTE — Patient Instructions (Addendum)
1) I want you to schedule an ES tylenol every 12 hours take with carvedilol  you may increase the dose if  needed up to   2000 mg total daily   2) continue your current losartan  dose (75 mg ) and your carvedilol (3.125 mg twice daily )   3) You can add Benefiber,  citrucel or metamucil daily to help bowels

## 2022-07-07 DIAGNOSIS — I5032 Chronic diastolic (congestive) heart failure: Secondary | ICD-10-CM | POA: Insufficient documentation

## 2022-07-07 NOTE — Assessment & Plan Note (Signed)
Thyroid function is WNL on 25 mcg levothyroxine  dose.  No current changes needed.   Lab Results  Component Value Date   TSH 3.53 07/05/2022

## 2022-07-07 NOTE — Assessment & Plan Note (Signed)
Managed with frequence changes of underwear and desitin cream . Treating OAB with Murbetriq

## 2022-07-07 NOTE — Assessment & Plan Note (Signed)
Lipids are at goal. (LDL  70).  No changes to regimen   Lab Results  Component Value Date   CHOL 140 07/05/2022   HDL 51.30 07/05/2022   LDLCALC 73 07/05/2022   LDLDIRECT 72.0 07/05/2022   TRIG 76.0 07/05/2022   CHOLHDL 3 07/05/2022

## 2022-07-07 NOTE — Assessment & Plan Note (Signed)
Improved with higher dose of losartan  started Jan 8.  Cr and lytes unchanged. .  Continue  75 mg losartan and 3.125 mg carvedilol.   Lab Results  Component Value Date   CREATININE 0.80 07/05/2022   Lab Results  Component Value Date   NA 139 07/05/2022   K 4.7 07/05/2022   CL 102 07/05/2022   CO2 28 07/05/2022

## 2022-07-07 NOTE — Assessment & Plan Note (Signed)
using torsemide prn weight gain. BP improved with 75 mg losartan and 3.125 mg carvedilol bid

## 2022-07-07 NOTE — Assessment & Plan Note (Signed)
Resolved.  Advised to suspend iron stores  Lab Results  Component Value Date   WBC 5.7 07/05/2022   HGB 12.0 07/05/2022   HCT 35.3 (L) 07/05/2022   MCV 96.6 07/05/2022   PLT 163.0 07/05/2022   Lab Results  Component Value Date   IRON 46 07/05/2022   TIBC 210.0 (L) 07/05/2022   FERRITIN 24.7 07/05/2022

## 2022-07-07 NOTE — Assessment & Plan Note (Signed)
She is asymptomatic with ADLs.  Continue regular follow up with cardiology ,  Continue aspirin, beta blocker , ARB and statin

## 2022-07-12 ENCOUNTER — Encounter: Payer: Self-pay | Admitting: Internal Medicine

## 2022-08-14 DIAGNOSIS — H353211 Exudative age-related macular degeneration, right eye, with active choroidal neovascularization: Secondary | ICD-10-CM | POA: Diagnosis not present

## 2022-09-13 ENCOUNTER — Other Ambulatory Visit: Payer: Self-pay | Admitting: Internal Medicine

## 2022-09-20 ENCOUNTER — Other Ambulatory Visit: Payer: Self-pay | Admitting: Internal Medicine

## 2022-09-20 ENCOUNTER — Other Ambulatory Visit: Payer: Self-pay

## 2022-09-20 MED ORDER — LOSARTAN POTASSIUM 50 MG PO TABS: 75.0000 mg | ORAL_TABLET | Freq: Every day | ORAL | 0 refills | Status: DC

## 2022-09-20 NOTE — Telephone Encounter (Signed)
Refill for Losartan sent to pharmacy.  Patient has appt 10/2022

## 2022-09-24 ENCOUNTER — Ambulatory Visit: Payer: Medicare HMO | Admitting: Cardiovascular Disease

## 2022-10-02 ENCOUNTER — Other Ambulatory Visit: Payer: Self-pay

## 2022-10-02 MED ORDER — MIRABEGRON ER 25 MG PO TB24: 25.0000 mg | ORAL_TABLET | Freq: Every day | ORAL | 5 refills | Status: DC

## 2022-10-16 DIAGNOSIS — H353211 Exudative age-related macular degeneration, right eye, with active choroidal neovascularization: Secondary | ICD-10-CM | POA: Diagnosis not present

## 2022-10-18 ENCOUNTER — Ambulatory Visit: Payer: Medicare HMO | Admitting: Nurse Practitioner

## 2022-11-01 ENCOUNTER — Encounter: Payer: Self-pay | Admitting: Nurse Practitioner

## 2022-11-01 ENCOUNTER — Ambulatory Visit: Payer: Medicare HMO | Attending: Cardiovascular Disease | Admitting: Nurse Practitioner

## 2022-11-01 VITALS — BP 180/76 | HR 64 | Ht 64.0 in | Wt 156.0 lb

## 2022-11-01 DIAGNOSIS — E785 Hyperlipidemia, unspecified: Secondary | ICD-10-CM

## 2022-11-01 DIAGNOSIS — I2510 Atherosclerotic heart disease of native coronary artery without angina pectoris: Secondary | ICD-10-CM | POA: Diagnosis not present

## 2022-11-01 DIAGNOSIS — I10 Essential (primary) hypertension: Secondary | ICD-10-CM

## 2022-11-01 DIAGNOSIS — I5032 Chronic diastolic (congestive) heart failure: Secondary | ICD-10-CM

## 2022-11-01 DIAGNOSIS — I38 Endocarditis, valve unspecified: Secondary | ICD-10-CM

## 2022-11-01 MED ORDER — LOSARTAN POTASSIUM 50 MG PO TABS: 75.0000 mg | ORAL_TABLET | Freq: Every day | ORAL | 3 refills | Status: DC

## 2022-11-01 NOTE — Progress Notes (Signed)
Office Visit    Patient Name: Jenna Marshall Date of Encounter: 11/01/2022  Primary Care Provider:  Sherlene Shams, MD Primary Cardiologist:  Lorine Bears, MD  Chief Complaint    89 year old female with history of CAD status post CABG, hypertension, whitecoat hypertension, hyperlipidemia, hypothyroidism, GERD, diastolic dysfunction, depression, arthritis, and aortic insufficiency, who presents for follow-up of CAD and hypertension.  Past Medical History    Past Medical History:  Diagnosis Date   Aortic insufficiency    a. 03/2017 Echo: Mod AI; b. 02/2019 Echo: Mod AI.   Arthritis    CAD (coronary artery disease)    a. Lexiscan 10/13/14: mid anterior to apical & inf wall ischemia w/ WMA, mild to mod dep EF; b. Cath 10/2014: LM 50ost, LM 99, ost LAD 95, 40p/m, RCA 30p, 68m; b. 10/2014 CABG x 4 (LIMA->LAD, VG->RCA, VG->RI->OM).   Depression    Diastolic dysfunction    a. 10/2014 Echo: EF 55-60%, no RWMA, GR1DD; b. 03/2017 Echo: EF 60-65%, mild LVH, Gr2 DD; c. 02/2019 Echo: EF 55-60%, sev LVH. Diast dysfxn. No rwma. RVSP 41.67mmHg. Mildly dil LA. Mild to mod MR. Mod TR. Mod AI.   GERD (gastroesophageal reflux disease)    Hyperlipidemia    Hypertension    Hypothyroidism    Macular degeneration    pernicious anemia    Varicose veins    Wears dentures    partial upper   Wears hearing aid    right   Past Surgical History:  Procedure Laterality Date   ANTERIOR VITRECTOMY Right 07/16/2016   Procedure: ANTERIOR VITRECTOMY;  Surgeon: Nevada Crane, MD;  Location: [facility];  Service: Ophthalmology;  Laterality: Right;   CARDIAC CATHETERIZATION Left 10/14/2014   Procedure: Left Heart Cath and Coronary Angiography;  Surgeon: Iran Ouch, MD;  Location: [facility];  Service: Cardiovascular;  Laterality: Left;   CATARACT EXTRACTION W/PHACO Left 04/30/2016   Procedure: CATARACT EXTRACTION PHACO AND INTRAOCULAR LENS PLACEMENT (IOC);  Surgeon: Nevada Crane,  MD;  Location: [facility];  Service: Ophthalmology;  Laterality: Left;  LEFT   CATARACT EXTRACTION W/PHACO Right 07/16/2016   Procedure: CATARACT EXTRACTION PHACO AND INTRAOCULAR LENS PLACEMENT (IOC)  Right;  Surgeon: Nevada Crane, MD;  Location: [facility];  Service: Ophthalmology;  Laterality: Right;   CORONARY ARTERY BYPASS GRAFT N/A 10/14/2014   Procedure: CORONARY ARTERY BYPASS GRAFTING (CABG), ON PUMP, TIMES FOUR, USING LEFT INTERNAL MAMMARY ARTERY, RIGHT GREATER SAPHENOUS VEIN HARVESTED ENDOSCOPICALLY;  Surgeon: Alleen Borne, MD;  Location: [facility] OR;  Service: Open Heart Surgery;  Laterality: N/A;   CORONARY ARTERY BYPASS GRAFT  10/2014   LIMA-->LAD, SVG-->RCA, sequential SVG-->Ramus and OM   HEMORRHOID SURGERY     HIP ARTHROPLASTY Left 01/14/2015   Procedure: ARTHROPLASTY BIPOLAR HIP (HEMIARTHROPLASTY);  Surgeon: Donato Heinz, MD;  Location: [facility] ORS;  Service: Orthopedics;  Laterality: Left;   VARICOSE VEIN SURGERY      Allergies  Allergies  Allergen Reactions   Ambien [Zolpidem] Other (See Comments)    Reaction:  Hallucinations   Codeine Nausea And Vomiting   Lasix [Furosemide] Itching and Rash    History of Present Illness    89 year old female with above complex past medical history including CAD, hypertension, whitecoat hypertension, hyperlipidemia, GERD, hypothyroidism, diastolic dysfunction, aortic insufficiency, depression, and arthritis. In May 2016, she underwent stress testing in the setting of chest discomfort, which was abnormal. This was followed by diagnostic catheterization, which showed severe left  main and ostial LAD disease. She subsequently underwent CABG x4. Approximately 3 months after bypass surgery, she fractured her left hip and required left hip hemiarthroplasty. She subsequently recovered well. Most recent echo in September 2020, showed an EF of 55 to 60% with severe LVH, diastolic dysfunction, elevated RVSP of 41.9 mmHg, moderate AI,  mild to moderate MR, and moderate TR.   Ms. Maresh was last seen in cardiology clinic in January 2024, at which time blood pressure was elevated, which is common during cardiology visits.  Home blood pressure trends 140s to 160s and out of concern for unsteadiness and falls, no changes were made to blood pressure regimen.  Since her last visit, Ms. Decook has done well.  She ambulates with a walker multiple times per day without symptoms or limitations.  She is also participating in planned exercise program.  She denies chest pain, dyspnea, palpitations, PND, orthopnea, dizziness, syncope, edema, or early satiety.  Her daughter checks her blood pressure intermittently at home.  Most recently, her blood pressure is 120/68 a few weeks ago.  Her blood pressure is elevated today.  Home Medications    Current Outpatient Medications  Medication Sig Dispense Refill   acetaminophen (TYLENOL) 325 MG tablet Take 2 tablets (650 mg total) by mouth every 6 (six) hours as needed for mild pain.     aspirin 81 MG chewable tablet Chew 1 tablet (81 mg total) by mouth daily. 30 tablet 0   atorvastatin (LIPITOR) 40 MG tablet TAKE 1 TABLET(40 MG) BY MOUTH DAILY 90 tablet 3   Calcium Carbonate-Vitamin D 600-200 MG-UNIT TABS Take 1 tablet by mouth 2 (two) times daily.     carvedilol (COREG) 3.125 MG tablet TAKE 1 TABLET(3.125 MG) BY MOUTH TWICE DAILY WITH A MEAL 180 tablet 3   Ferrous Sulfate Dried (SLOW RELEASE IRON) 45 MG TBCR Take 1 tablet by mouth daily with breakfast. 90 tablet 1   levothyroxine (SYNTHROID) 25 MCG tablet TAKE 1 TABLET(25 MCG) BY MOUTH DAILY BEFORE BREAKFAST 90 tablet 3   mirabegron ER (MYRBETRIQ) 25 MG TB24 tablet Take 1 tablet (25 mg total) by mouth daily. 30 tablet 5   Multiple Vitamin (MULTIVITAMIN WITH MINERALS) TABS tablet Take 1 tablet by mouth daily.     Multiple Vitamins-Minerals (PRESERVISION AREDS 2) CAPS Take 1 capsule by mouth 2 (two) times daily.      pantoprazole (PROTONIX) 40 MG tablet  Take 1 tablet (40 mg total) by mouth daily. 90 tablet 3   senna-docusate (SENOKOT-S) 8.6-50 MG per tablet Take 1 tablet by mouth 2 (two) times daily. 30 tablet 0   torsemide (DEMADEX) 10 MG tablet Take 1 tablet daily as needed for shortness of breath or weight gain 3 lbs or greater overnight.     losartan (COZAAR) 50 MG tablet Take 1.5 tablets (75 mg total) by mouth daily. 135 tablet 3   No current facility-administered medications for this visit.     Review of Systems    She denies chest pain, palpitations, dyspnea, pnd, orthopnea, n, v, dizziness, syncope, edema, weight gain, or early satiety.  All other systems reviewed and are otherwise negative except as noted above.    Physical Exam    VS:  BP (!) 180/76 (BP Location: Left Arm, Patient Position: Sitting, Cuff Size: Normal)   Pulse 64   Ht 5\' 4"  (1.626 m)   Wt 156 lb (70.8 kg)   SpO2 94%   BMI 26.78 kg/m  , BMI Body mass index is 26.78 kg/m.  GEN: Well nourished, well developed, in no acute distress. HEENT: normal. Neck: Supple, no JVD, carotid bruits, or masses. Cardiac: RRR, 3/6 systolic murmur heard loudest at the upper sternal borders and along the left sternal border.  No rubs or gallops. No clubbing, cyanosis, trace lower extremity edema in the setting of multiple varicosities.  Radials 2+/PT 2+ and equal bilaterally.  Respiratory:  Respirations regular and unlabored, clear to auscultation bilaterally. GI: Soft, nontender, nondistended, BS + x 4. MS: no deformity or atrophy. Skin: warm and dry, no rash. Neuro:  Strength and sensation are intact. Psych: Normal affect.  Accessory Clinical Findings     Lab Results  Component Value Date   WBC 5.7 07/05/2022   HGB 12.0 07/05/2022   HCT 35.3 (L) 07/05/2022   MCV 96.6 07/05/2022   PLT 163.0 07/05/2022   Lab Results  Component Value Date   CREATININE 0.80 07/05/2022   BUN 26 (H) 07/05/2022   NA 139 07/05/2022   K 4.7 07/05/2022   CL 102 07/05/2022   CO2 28  07/05/2022   Lab Results  Component Value Date   ALT 11 07/05/2022   AST 19 07/05/2022   ALKPHOS 67 07/05/2022   BILITOT 0.4 07/05/2022   Lab Results  Component Value Date   CHOL 140 07/05/2022   HDL 51.30 07/05/2022   LDLCALC 73 07/05/2022   LDLDIRECT 72.0 07/05/2022   TRIG 76.0 07/05/2022   CHOLHDL 3 07/05/2022    Lab Results  Component Value Date   HGBA1C 6.0 01/25/2022    Assessment & Plan    1.  Coronary artery disease: Status post CABG x 4 in 2016.  Doing well without chest pain or dyspnea and is getting some amount of walking and exercise daily.  She remains on aspirin, statin, beta-blocker, and ARB therapy.  2.  Essential hypertension/whitecoat hypertension: Long history of elevated blood pressures in clinic more normal range at home.  Pressure elevated today at 180/76 the recent home blood pressure was 120/68.  Recordings at home provided today typically runs in the 120s to 140s.  Continue current dose of beta-blocker and ARB and family will continue with blood pressure at home.  3.  Chronic heart failure with preserved ejection fraction: Doing well and euvolemic on examination with only trace ankle edema in the setting of varicosities.  She has torsemide at home to be used as needed however, has not required this.  Blood pressure management as outlined above.  4.  Hyperlipidemia: LDL was 72 in February of this year, which is up from last year.  LFTs normal at that time.  Continue current dose of statin.  Continue regular activity with low-fat low-cholesterol diet.  5.  Valvular heart disease including mild to moderate MR, moderate TR, and moderate AI: Systolic murmur on examination.  No significant symptoms.  Continue conservative therapy.  6.  Disposition: Follow-up in clinic in 6 months or sooner if necessary.  Nicolasa Ducking, NP 11/01/2022, 11:01 AM

## 2022-11-01 NOTE — Patient Instructions (Signed)
Medication Instructions:  No changes *If you need a refill on your cardiac medications before your next appointment, please call your pharmacy*   Lab Work: None ordered If you have labs (blood work) drawn today and your tests are completely normal, you will receive your results only by: MyChart Message (if you have MyChart) OR A paper copy in the mail If you have any lab test that is abnormal or we need to change your treatment, we will call you to review the results.   Testing/Procedures: None ordered   Follow-Up: At [facility], you and your health needs are our priority.  As part of our continuing mission to provide you with exceptional heart care, we have created designated Provider Care Teams.  These Care Teams include your primary Cardiologist (physician) and Advanced Practice Providers (APPs -  Physician Assistants and Nurse Practitioners) who all work together to provide you with the care you need, when you need it.  We recommend signing up for the patient portal called "MyChart".  Sign up information is provided on this After Visit Summary.  MyChart is used to connect with patients for Virtual Visits (Telemedicine).  Patients are able to view lab/test results, encounter notes, upcoming appointments, etc.  Non-urgent messages can be sent to your provider as well.   To learn more about what you can do with MyChart, go to [email or web address].    Your next appointment:   6 month(s)  Provider:   Muhammad Arida, MD   

## 2022-12-11 DIAGNOSIS — H353211 Exudative age-related macular degeneration, right eye, with active choroidal neovascularization: Secondary | ICD-10-CM | POA: Diagnosis not present

## 2022-12-24 ENCOUNTER — Other Ambulatory Visit: Payer: Self-pay

## 2022-12-24 MED ORDER — ATORVASTATIN CALCIUM 40 MG PO TABS: ORAL_TABLET | ORAL | 3 refills | Status: DC

## 2023-01-01 ENCOUNTER — Ambulatory Visit (INDEPENDENT_AMBULATORY_CARE_PROVIDER_SITE_OTHER): Payer: Medicare HMO | Admitting: *Deleted

## 2023-01-01 VITALS — Ht 61.0 in | Wt 156.0 lb

## 2023-01-01 DIAGNOSIS — Z0000 Encounter for general adult medical examination without abnormal findings: Secondary | ICD-10-CM

## 2023-01-01 NOTE — Patient Instructions (Signed)
Jenna Marshall , Thank you for taking time to come for your Medicare Wellness Visit. I appreciate your ongoing commitment to your health goals. Please review the following plan we discussed and let me know if I can assist you in the future.   Referrals/Orders/Follow-Ups/Clinician Recommendations: None  This is a list of the screening recommended for you and due dates:  Health Maintenance  Topic Date Due   COVID-19 Vaccine (5 - 2023-24 season) 04/17/2022   Flu Shot  01/02/2023   DTaP/Tdap/Td vaccine (2 - Td or Tdap) 10/07/2023   Medicare Annual Wellness Visit  01/01/2024   Pneumonia Vaccine  Completed   DEXA scan (bone density measurement)  Completed   Zoster (Shingles) Vaccine  Completed   HPV Vaccine  Aged Out    Advanced directives: (In Chart) A copy of your advanced directives are scanned into your chart should your provider ever need it.  Next Medicare Annual Wellness Visit scheduled for next year: Yes 01/06/24 @ 8:45  Preventive Care 65 Years and Older, Female Preventive care refers to lifestyle choices and visits with your health care provider that can promote health and wellness. What does preventive care include? A yearly physical exam. This is also called an annual well check. Dental exams once or twice a year. Routine eye exams. Ask your health care provider how often you should have your eyes checked. Personal lifestyle choices, including: Daily care of your teeth and gums. Regular physical activity. Eating a healthy diet. Avoiding tobacco and drug use. Limiting alcohol use. Practicing safe sex. Taking low-dose aspirin every day. Taking vitamin and mineral supplements as recommended by your health care provider. What happens during an annual well check? The services and screenings done by your health care provider during your annual well check will depend on your age, overall health, lifestyle risk factors, and family history of disease. Counseling  Your health care  provider may ask you questions about your: Alcohol use. Tobacco use. Drug use. Emotional well-being. Home and relationship well-being. Sexual activity. Eating habits. History of falls. Memory and ability to understand (cognition). Work and work Astronomer. Reproductive health. Screening  You may have the following tests or measurements: Height, weight, and BMI. Blood pressure. Lipid and cholesterol levels. These may be checked every 5 years, or more frequently if you are over 62 years old. Skin check. Lung cancer screening. You may have this screening every year starting at age 44 if you have a 30-pack-year history of smoking and currently smoke or have quit within the past 15 years. Fecal occult blood test (FOBT) of the stool. You may have this test every year starting at age 38. Flexible sigmoidoscopy or colonoscopy. You may have a sigmoidoscopy every 5 years or a colonoscopy every 10 years starting at age 40. Hepatitis C blood test. Hepatitis B blood test. Sexually transmitted disease (STD) testing. Diabetes screening. This is done by checking your blood sugar (glucose) after you have not eaten for a while (fasting). You may have this done every 1-3 years. Bone density scan. This is done to screen for osteoporosis. You may have this done starting at age 26. Mammogram. This may be done every 1-2 years. Talk to your health care provider about how often you should have regular mammograms. Talk with your health care provider about your test results, treatment options, and if necessary, the need for more tests. Vaccines  Your health care provider may recommend certain vaccines, such as: Influenza vaccine. This is recommended every year. Tetanus, diphtheria, and  acellular pertussis (Tdap, Td) vaccine. You may need a Td booster every 10 years. Zoster vaccine. You may need this after age 61. Pneumococcal 13-valent conjugate (PCV13) vaccine. One dose is recommended after age  65. Pneumococcal polysaccharide (PPSV23) vaccine. One dose is recommended after age 73. Talk to your health care provider about which screenings and vaccines you need and how often you need them. This information is not intended to replace advice given to you by your health care provider. Make sure you discuss any questions you have with your health care provider. Document Released: 06/16/2015 Document Revised: 02/07/2016 Document Reviewed: 03/21/2015 Elsevier Interactive Patient Education  2017 [employer].  Fall Prevention in the Home Falls can cause injuries. They can happen to people of all ages. There are many things you can do to make your home safe and to help prevent falls. What can I do on the outside of my home? Regularly fix the edges of walkways and driveways and fix any cracks. Remove anything that might make you trip as you walk through a door, such as a raised step or threshold. Trim any bushes or trees on the path to your home. Use bright outdoor lighting. Clear any walking paths of anything that might make someone trip, such as rocks or tools. Regularly check to see if handrails are loose or broken. Make sure that both sides of any steps have handrails. Any raised decks and porches should have guardrails on the edges. Have any leaves, snow, or ice cleared regularly. Use sand or salt on walking paths during winter. Clean up any spills in your garage right away. This includes oil or grease spills. What can I do in the bathroom? Use night lights. Install grab bars by the toilet and in the tub and shower. Do not use towel bars as grab bars. Use non-skid mats or decals in the tub or shower. If you need to sit down in the shower, use a plastic, non-slip stool. Keep the floor dry. Clean up any water that spills on the floor as soon as it happens. Remove soap buildup in the tub or shower regularly. Attach bath mats securely with double-sided non-slip rug tape. Do not have throw  rugs and other things on the floor that can make you trip. What can I do in the bedroom? Use night lights. Make sure that you have a light by your bed that is easy to reach. Do not use any sheets or blankets that are too big for your bed. They should not hang down onto the floor. Have a firm chair that has side arms. You can use this for support while you get dressed. Do not have throw rugs and other things on the floor that can make you trip. What can I do in the kitchen? Clean up any spills right away. Avoid walking on wet floors. Keep items that you use a lot in easy-to-reach places. If you need to reach something above you, use a strong step stool that has a grab bar. Keep electrical cords out of the way. Do not use floor polish or wax that makes floors slippery. If you must use wax, use non-skid floor wax. Do not have throw rugs and other things on the floor that can make you trip. What can I do with my stairs? Do not leave any items on the stairs. Make sure that there are handrails on both sides of the stairs and use them. Fix handrails that are broken or loose. Make sure that  handrails are as long as the stairways. Check any carpeting to make sure that it is firmly attached to the stairs. Fix any carpet that is loose or worn. Avoid having throw rugs at the top or bottom of the stairs. If you do have throw rugs, attach them to the floor with carpet tape. Make sure that you have a light switch at the top of the stairs and the bottom of the stairs. If you do not have them, ask someone to add them for you. What else can I do to help prevent falls? Wear shoes that: Do not have high heels. Have rubber bottoms. Are comfortable and fit you well. Are closed at the toe. Do not wear sandals. If you use a stepladder: Make sure that it is fully opened. Do not climb a closed stepladder. Make sure that both sides of the stepladder are locked into place. Ask someone to hold it for you, if  possible. Clearly mark and make sure that you can see: Any grab bars or handrails. First and last steps. Where the edge of each step is. Use tools that help you move around (mobility aids) if they are needed. These include: Canes. Walkers. Scooters. Crutches. Turn on the lights when you go into a dark area. Replace any light bulbs as soon as they burn out. Set up your furniture so you have a clear path. Avoid moving your furniture around. If any of your floors are uneven, fix them. If there are any pets around you, be aware of where they are. Review your medicines with your doctor. Some medicines can make you feel dizzy. This can increase your chance of falling. Ask your doctor what other things that you can do to help prevent falls. This information is not intended to replace advice given to you by your health care provider. Make sure you discuss any questions you have with your health care provider. Document Released: 03/16/2009 Document Revised: 10/26/2015 Document Reviewed: 06/24/2014 Elsevier Interactive Patient Education  2017 [employer].

## 2023-01-01 NOTE — Progress Notes (Signed)
Subjective:   Jenna Marshall is a 89 y.o. female who presents for Medicare Annual (Subsequent) preventive examination.  Visit Complete: Virtual  I connected with  Randie Heinz on 01/01/23 by a audio enabled telemedicine application and verified that I am speaking with the correct person using two identifiers.  Patient Location: Home  Provider Location: Office/Clinic  I discussed the limitations of evaluation and management by telemedicine. The patient expressed understanding and agreed to proceed.  Vital Signs: Unable to obtain new vitals due to this being a telehealth visit.  Review of Systems     Cardiac Risk Factors include: advanced age (>65men, >109 women);dyslipidemia;hypertension;Other (see comment), Risk factor comments: CAD     Objective:    Today's Vitals   01/01/23 0954  Weight: 156 lb (70.8 kg)  Height: 5\' 1"  (1.549 m)   Body mass index is 29.48 kg/m.     01/01/2023   10:06 AM 12/17/2021    3:20 PM 12/13/2020   12:41 PM 08/30/2020    3:48 PM 12/13/2019    1:08 PM 12/10/2018   10:40 AM 09/17/2017   10:43 AM  Advanced Directives  Does Patient Have a Medical Advance Directive? Yes Yes Yes Yes Yes Yes Yes  Type of Estate agent of [place];Living will Healthcare Power of [place];Living will Healthcare Power of [place];Living will Healthcare Power of [employer] of [place];Living will Healthcare Power of [place];Living will Healthcare Power of Attorney  Does patient want to make changes to medical advance directive? No - Patient declined No - Patient declined No - Patient declined  No - Patient declined No - Patient declined No - Patient declined  Copy of Healthcare Power of Attorney in Chart? Yes - validated most recent copy scanned in chart (See row information) Yes - validated most recent copy scanned in chart (See row information) Yes - validated most recent copy scanned in chart (See row information) No - copy requested Yes -  validated most recent copy scanned in chart (See row information) Yes - validated most recent copy scanned in chart (See row information) No - copy requested    Current Medications (verified) Outpatient Encounter Medications as of 01/01/2023  Medication Sig   acetaminophen (TYLENOL) 325 MG tablet Take 2 tablets (650 mg total) by mouth every 6 (six) hours as needed for mild pain.   aspirin 81 MG chewable tablet Chew 1 tablet (81 mg total) by mouth daily.   atorvastatin (LIPITOR) 40 MG tablet TAKE 1 TABLET(40 MG) BY MOUTH DAILY   Calcium Carbonate-Vitamin D 600-200 MG-UNIT TABS Take 1 tablet by mouth 2 (two) times daily.   carvedilol (COREG) 3.125 MG tablet TAKE 1 TABLET(3.125 MG) BY MOUTH TWICE DAILY WITH A MEAL   levothyroxine (SYNTHROID) 25 MCG tablet TAKE 1 TABLET(25 MCG) BY MOUTH DAILY BEFORE BREAKFAST   losartan (COZAAR) 50 MG tablet Take 1.5 tablets (75 mg total) by mouth daily.   mirabegron ER (MYRBETRIQ) 25 MG TB24 tablet Take 1 tablet (25 mg total) by mouth daily.   Multiple Vitamin (MULTIVITAMIN WITH MINERALS) TABS tablet Take 1 tablet by mouth daily.   Multiple Vitamins-Minerals (PRESERVISION AREDS 2) CAPS Take 1 capsule by mouth 2 (two) times daily.    pantoprazole (PROTONIX) 40 MG tablet Take 1 tablet (40 mg total) by mouth daily.   senna-docusate (SENOKOT-S) 8.6-50 MG per tablet Take 1 tablet by mouth 2 (two) times daily.   torsemide (DEMADEX) 10 MG tablet Take 1 tablet daily as needed for shortness of  breath or weight gain 3 lbs or greater overnight.   Ferrous Sulfate Dried (SLOW RELEASE IRON) 45 MG TBCR Take 1 tablet by mouth daily with breakfast. (Patient not taking: Reported on 01/01/2023)   [DISCONTINUED] citalopram (CELEXA) 20 MG tablet Take 20 mg by mouth daily.     No facility-administered encounter medications on file as of 01/01/2023.    Allergies (verified) Ambien [zolpidem], Codeine, and Lasix [furosemide]   History: Past Medical History:  Diagnosis Date   Aortic  insufficiency    a. 03/2017 Echo: Mod AI; b. 02/2019 Echo: Mod AI.   Arthritis    CAD (coronary artery disease)    a. Lexiscan 10/13/14: mid anterior to apical & inf wall ischemia w/ WMA, mild to mod dep EF; b. Cath 10/2014: LM 50ost, LM 99, ost LAD 95, 40p/m, RCA 30p, 46m; b. 10/2014 CABG x 4 (LIMA->LAD, VG->RCA, VG->RI->OM).   Depression    Diastolic dysfunction    a. 10/2014 Echo: EF 55-60%, no RWMA, GR1DD; b. 03/2017 Echo: EF 60-65%, mild LVH, Gr2 DD; c. 02/2019 Echo: EF 55-60%, sev LVH. Diast dysfxn. No rwma. RVSP 41.26mmHg. Mildly dil LA. Mild to mod MR. Mod TR. Mod AI.   GERD (gastroesophageal reflux disease)    Hyperlipidemia    Hypertension    Hypothyroidism    Macular degeneration    pernicious anemia    Varicose veins    Wears dentures    partial upper   Wears hearing aid    right   Past Surgical History:  Procedure Laterality Date   ANTERIOR VITRECTOMY Right 07/16/2016   Procedure: ANTERIOR VITRECTOMY;  Surgeon: Nevada Crane, MD;  Location: [facility];  Service: Ophthalmology;  Laterality: Right;   CARDIAC CATHETERIZATION Left 10/14/2014   Procedure: Left Heart Cath and Coronary Angiography;  Surgeon: Iran Ouch, MD;  Location: [facility] INVASIVE CV LAB;  Service: Cardiovascular;  Laterality: Left;   CATARACT EXTRACTION W/PHACO Left 04/30/2016   Procedure: CATARACT EXTRACTION PHACO AND INTRAOCULAR LENS PLACEMENT (IOC);  Surgeon: Nevada Crane, MD;  Location: [facility];  Service: Ophthalmology;  Laterality: Left;  LEFT   CATARACT EXTRACTION W/PHACO Right 07/16/2016   Procedure: CATARACT EXTRACTION PHACO AND INTRAOCULAR LENS PLACEMENT (IOC)  Right;  Surgeon: Nevada Crane, MD;  Location: [facility];  Service: Ophthalmology;  Laterality: Right;   CORONARY ARTERY BYPASS GRAFT N/A 10/14/2014   Procedure: CORONARY ARTERY BYPASS GRAFTING (CABG), ON PUMP, TIMES FOUR, USING LEFT INTERNAL MAMMARY ARTERY, RIGHT GREATER SAPHENOUS VEIN HARVESTED  ENDOSCOPICALLY;  Surgeon: Alleen Borne, MD;  Location: [facility] OR;  Service: Open Heart Surgery;  Laterality: N/A;   CORONARY ARTERY BYPASS GRAFT  10/2014   LIMA-->LAD, SVG-->RCA, sequential SVG-->Ramus and OM   HEMORRHOID SURGERY     HIP ARTHROPLASTY Left 01/14/2015   Procedure: ARTHROPLASTY BIPOLAR HIP (HEMIARTHROPLASTY);  Surgeon: Donato Heinz, MD;  Location: [facility] ORS;  Service: Orthopedics;  Laterality: Left;   VARICOSE VEIN SURGERY     Family History  Problem Relation Age of Onset   Coronary artery disease Mother    Diabetes Mother    Heart disease Father    Coronary artery disease Brother    Breast cancer Neg Hx    Social History   Socioeconomic History   Marital status: Widowed    Spouse name: Not on file   Number of children: Not on file   Years of education: Not on file   Highest education level: Not on file  Occupational History  Not on file  Tobacco Use   Smoking status: Never   Smokeless tobacco: Never  Vaping Use   Vaping status: Never Used  Substance and Sexual Activity   Alcohol use: No   Drug use: No   Sexual activity: Never  Other Topics Concern   Not on file  Social History Narrative   ** Merged History Encounter **       Social Determinants of Health   Financial Resource Strain: Low Risk  (01/01/2023)   Overall Financial Resource Strain (CARDIA)    Difficulty of Paying Living Expenses: Not hard at all  Food Insecurity: No Food Insecurity (01/01/2023)   Hunger Vital Sign    Worried About Running Out of Food in the Last Year: Never true    Ran Out of Food in the Last Year: Never true  Transportation Needs: No Transportation Needs (01/01/2023)   PRAPARE - Administrator, Civil Service (Medical): No    Lack of Transportation (Non-Medical): No  Physical Activity: Inactive (01/01/2023)   Exercise Vital Sign    Days of Exercise per Week: 0 days    Minutes of Exercise per Session: 0 min  Stress: No Stress Concern Present (01/01/2023)   [employer] of Occupational Health - Occupational Stress Questionnaire    Feeling of Stress : Not at all  Social Connections: Socially Isolated (01/01/2023)   Social Connection and Isolation Panel [NHANES]    Frequency of Communication with Friends and Family: More than three times a week    Frequency of Social Gatherings with Friends and Family: More than three times a week    Attends Religious Services: Never    Database administrator or Organizations: No    Attends Banker Meetings: Never    Marital Status: Widowed    Tobacco Counseling Counseling given: Not Answered   Clinical Intake:  Pre-visit preparation completed: Yes  Pain : No/denies pain     BMI - recorded: 29.48 Nutritional Status: BMI 25 -29 Overweight Nutritional Risks: None Diabetes: No  How often do you need to have someone help you when you read instructions, pamphlets, or other written materials from your doctor or pharmacy?: 1 - Never  Interpreter Needed?: No  Information entered by :: R. Yashica Sterbenz LPN   Activities of Daily Living    01/01/2023    9:56 AM  In your present state of health, do you have any difficulty performing the following activities:  Hearing? 1  Comment wears aids  Vision? 0  Comment readers  Difficulty concentrating or making decisions? 0  Walking or climbing stairs? 1  Comment uses a walker  Dressing or bathing? 0  Doing errands, shopping? 1  Comment children help  Preparing Food and eating ? N  Using the Toilet? N  In the past six months, have you accidently leaked urine? Y  Comment wears pads  Do you have problems with loss of bowel control? N  Managing your Medications? Y  Comment daughter takes care of  Managing your Finances? Y  Comment children take care of  Housekeeping or managing your Housekeeping? Y  Comment has some help    Patient Care Team: Sherlene Shams, MD as PCP - General (Internal Medicine) Iran Ouch, MD as PCP - Cardiology  (Cardiology) Sherlene Shams, MD (Internal Medicine) Kennedy Bucker, MD as Consulting Physician (Orthopedic Surgery)  Indicate any recent Medical Services you may have received from other than [facility] providers in the past year (date  may be approximate).     Assessment:   This is a routine wellness examination for Jennifer.  Hearing/Vision screen Hearing Screening - Comments:: Wears aids Vision Screening - Comments:: readers  Dietary issues and exercise activities discussed:     Goals Addressed             This Visit's Progress    Patient Stated       Wants to continue her exercises that she is able to do       Depression Screen    01/01/2023   10:02 AM 07/05/2022    9:43 AM 01/25/2022    9:35 AM 12/17/2021    3:17 PM 07/26/2021    8:09 AM 12/13/2020   12:38 PM 08/30/2020    4:34 PM  PHQ 2/9 Scores  PHQ - 2 Score 0 0 0 0 0 0 0  PHQ- 9 Score 0          Fall Risk    01/01/2023    9:59 AM 07/05/2022    9:43 AM 01/25/2022    9:35 AM 01/25/2022    9:34 AM 12/17/2021    3:22 PM  Fall Risk   Falls in the past year? 0 0 0 0 0  Number falls in past yr: 0 0     Injury with Fall? 0 0     Risk for fall due to : No Fall Risks No Fall Risks No Fall Risks No Fall Risks Impaired balance/gait  Risk for fall due to: Comment     Walker  Follow up Falls prevention discussed;Falls evaluation completed Falls evaluation completed Falls evaluation completed Falls evaluation completed Falls evaluation completed    MEDICARE RISK AT HOME:  Medicare Risk at Home - 01/01/23 0959     Any stairs in or around the home? No    If so, are there any without handrails? No    Home free of loose throw rugs in walkways, pet beds, electrical cords, etc? Yes    Adequate lighting in your home to reduce risk of falls? Yes    Life alert? No    Use of a cane, walker or w/c? Yes   uses a walker   Grab bars in the bathroom? Yes    Shower chair or bench in shower? Yes    Elevated toilet seat or a handicapped  toilet? Yes             Cognitive Function:        01/01/2023   10:06 AM 12/13/2020    1:06 PM 12/13/2019    1:29 PM 12/10/2018   10:48 AM 09/17/2017   10:42 AM  6CIT Screen  What Year? 0 points 0 points 0 points 0 points 0 points  What month? 0 points 0 points 0 points 0 points 0 points  What time? 0 points 0 points  0 points 0 points  Count back from 20 2 points 0 points  0 points 0 points  Months in reverse 4 points  0 points 0 points 0 points  Repeat phrase 0 points    0 points  Total Score 6 points    0 points    Immunizations Immunization History  Administered Date(s) Administered   Covid-19, Mrna,Vaccine(Spikevax)41yrs and older 02/20/2022   Influenza Split 02/27/2011, 03/04/2012   Influenza, High Dose Seasonal PF 03/31/2013, 02/24/2018, 02/24/2019   Influenza,inj,Quad PF,6+ Mos 03/30/2014   Influenza-Unspecified 02/06/2016, 03/05/2017, 03/03/2020, 03/03/2021, 02/20/2022   PFIZER(Purple Top)SARS-COV-2 Vaccination 06/25/2019, 07/16/2019, 03/03/2020  Pneumococcal Conjugate-13 03/30/2014   Pneumococcal Polysaccharide-23 03/10/2009, 04/19/2015   Rsv, Bivalent, Protein Subunit Rsvpref,pf Verdis Frederickson) 05/14/2022   Tdap 10/06/2013   Zoster Recombinant(Shingrix) 06/25/2018, 12/31/2018   Zoster, Live 03/11/2011    TDAP status: Up to date  Flu Vaccine status: Up to date  Pneumococcal vaccine status: Up to date  Covid-19 vaccine status: Completed vaccines  Qualifies for Shingles Vaccine? Yes   Zostavax completed Yes   Shingrix Completed?: Yes  Screening Tests Health Maintenance  Topic Date Due   COVID-19 Vaccine (5 - 2023-24 season) 04/17/2022   Medicare Annual Wellness (AWV)  12/18/2022   INFLUENZA VACCINE  01/02/2023   DTaP/Tdap/Td (2 - Td or Tdap) 10/07/2023   Pneumonia Vaccine 76+ Years old  Completed   DEXA SCAN  Completed   Zoster Vaccines- Shingrix  Completed   HPV VACCINES  Aged Out    Health Maintenance  Health Maintenance Due  Topic Date Due    COVID-19 Vaccine (5 - 2023-24 season) 04/17/2022   Medicare Annual Wellness (AWV)  12/18/2022    Colorectal cancer screening: No longer required.   Mammogram status: No longer required due to age.  Bone Density status: Completed 12/14. Results reflect: Bone density results: OSTEOPENIA. Repeat every 2 years. Patient declines at this time  Lung Cancer Screening: (Low Dose CT Chest recommended if Age 104-80 years, 20 pack-year currently smoking OR have quit w/in 15years.) does not qualify.    Additional Screening:  Hepatitis C Screening: does not qualify; Completed NA age  Vision Screening: Recommended annual ophthalmology exams for early detection of glaucoma and other disorders of the eye. Is the patient up to date with their annual eye exam?  Yes  Who is the provider or what is the name of the office in which the patient attends annual eye exams? [facility] If pt is not established with a provider, would they like to be referred to a provider to establish care? No .   Dental Screening: Recommended annual dental exams for proper oral hygiene    Community Resource Referral / Chronic Care Management: CRR required this visit?  No   CCM required this visit?  No     Plan:     I have personally reviewed and noted the following in the patient's chart:   Medical and social history Use of alcohol, tobacco or illicit drugs  Current medications and supplements including opioid prescriptions. Patient is not currently taking opioid prescriptions. Functional ability and status Nutritional status Physical activity Advanced directives List of other physicians Hospitalizations, surgeries, and ER visits in previous 12 months Vitals Screenings to include cognitive, depression, and falls Referrals and appointments  In addition, I have reviewed and discussed with patient certain preventive protocols, quality metrics, and best practice recommendations. A written personalized care plan  for preventive services as well as general preventive health recommendations were provided to patient.     Sydell Axon, LPN   07/03/8655   After Visit Summary: (MyChart) Due to this being a telephonic visit, the after visit summary with patients personalized plan was offered to patient via MyChart   Nurse Notes: None

## 2023-01-16 ENCOUNTER — Encounter (INDEPENDENT_AMBULATORY_CARE_PROVIDER_SITE_OTHER): Payer: Self-pay

## 2023-01-22 DIAGNOSIS — H353211 Exudative age-related macular degeneration, right eye, with active choroidal neovascularization: Secondary | ICD-10-CM | POA: Diagnosis not present

## 2023-01-28 ENCOUNTER — Encounter: Payer: Self-pay | Admitting: Internal Medicine

## 2023-01-29 MED ORDER — TORSEMIDE 10 MG PO TABS
ORAL_TABLET | ORAL | 2 refills | Status: DC
Start: 2023-01-29 — End: 2023-02-24

## 2023-02-11 ENCOUNTER — Ambulatory Visit
Admission: EM | Admit: 2023-02-11 | Discharge: 2023-02-11 | Disposition: A | Discharge status: Home / Self Care | Payer: Medicare HMO | Attending: Urgent Care | Admitting: Urgent Care

## 2023-02-11 ENCOUNTER — Ambulatory Visit (INDEPENDENT_AMBULATORY_CARE_PROVIDER_SITE_OTHER): Payer: Medicare HMO

## 2023-02-11 DIAGNOSIS — G8929 Other chronic pain: Secondary | ICD-10-CM | POA: Diagnosis not present

## 2023-02-11 DIAGNOSIS — M85861 Other specified disorders of bone density and structure, right lower leg: Secondary | ICD-10-CM | POA: Diagnosis not present

## 2023-02-11 DIAGNOSIS — M25561 Pain in right knee: Secondary | ICD-10-CM

## 2023-02-11 DIAGNOSIS — M1711 Unilateral primary osteoarthritis, right knee: Secondary | ICD-10-CM | POA: Diagnosis not present

## 2023-02-11 LAB — D-DIMER, QUANTITATIVE: D-Dimer, Quant: 0.58 ug{FEU}/mL — ABNORMAL HIGH (ref 0.00–0.50)

## 2023-02-11 NOTE — ED Provider Notes (Signed)
[facility]    CSN: [id number] Arrival date & time: 02/11/23  1240      History   Chief Complaint Chief Complaint  Patient presents with   Knee Pain    HPI Jenna Marshall is a 89 y.o. female.    Knee Pain  Patient is accompanied by her daughter.  She presents with complaint of right knee pain, on and off for several weeks.  She denies any injury or known precipitating event.  Patient is hypertensive in clinic with blood pressure measured at 182/67.   She has PMH including CAD s/p CABG x 4, bilateral knee pain, chronic left hip pain, general weakness and unsteadiness while walking, diastolic heart failure.  Review of the patient's chart indicates bilateral knee pain reported since 04/05/2013, with synvisc injections reported on 03/30/2014 providing 3 to 4 months of "excellent" relief, resolved with home PT per progress note 07/04/2020.  She reports hx of L hip fracture c. 2017 after a fall. She reports no recent hip pain and denies recently altered gait prior to development of R knee pain.  Knee pain is primarily posterior and worse when she bends the knee.  Past Medical History:  Diagnosis Date   Aortic insufficiency    a. 03/2017 Echo: Mod AI; b. 02/2019 Echo: Mod AI.   Arthritis    CAD (coronary artery disease)    a. Lexiscan 10/13/14: mid anterior to apical & inf wall ischemia w/ WMA, mild to mod dep EF; b. Cath 10/2014: LM 50ost, LM 99, ost LAD 95, 40p/m, RCA 30p, 90m; b. 10/2014 CABG x 4 (LIMA->LAD, VG->RCA, VG->RI->OM).   Depression    Diastolic dysfunction    a. 10/2014 Echo: EF 55-60%, no RWMA, GR1DD; b. 03/2017 Echo: EF 60-65%, mild LVH, Gr2 DD; c. 02/2019 Echo: EF 55-60%, sev LVH. Diast dysfxn. No rwma. RVSP 41.35mmHg. Mildly dil LA. Mild to mod MR. Mod TR. Mod AI.   GERD (gastroesophageal reflux disease)    Hyperlipidemia    Hypertension    Hypothyroidism    Macular degeneration    pernicious anemia    Varicose veins    Wears dentures    partial  upper   Wears hearing aid    right    Patient Active Problem List   Diagnosis Date Noted   Chronic diastolic heart failure (HCC) 07/07/2022   General weakness 03/07/2020   Unsteady gait when walking 03/07/2020   Balance problem 03/07/2020   Chronic left hip pain 01/17/2020   Visit for preventive health examination 05/09/2016   Nocturia more than twice per night 05/09/2016   Neoplasm of skin of earlobe 11/03/2015   Iron deficiency anemia 03/03/2015   Left-sided thoracic back pain 03/03/2015   History of hip fracture 01/13/2015   Drug rash 12/29/2014   S/P CABG x 4 10/14/2014   CAD (coronary artery disease)    Diastolic dysfunction    Elevated blood pressure reading with diagnosis of hypertension 10/11/2014   Hyperlipidemia 10/11/2014   Gastroesophageal reflux disease without esophagitis 10/11/2014   Hypothyroidism 10/11/2014   Medicare annual wellness visit, subsequent 04/01/2014   Urinary incontinence, urge 09/25/2013   Osteopenia of the elderly 05/13/2013   Knee pain, bilateral 04/05/2013   Long-term use of high-risk medication 03/12/2013   History of shingles 09/02/2012   History of DVT of lower extremity 09/02/2012   Obesity (BMI 30-39.9) 09/01/2011   Macular degeneration     Past Surgical History:  Procedure Laterality Date   ANTERIOR VITRECTOMY Right 07/16/2016  Procedure: ANTERIOR VITRECTOMY;  Surgeon: Nevada Crane, MD;  Location: [facility];  Service: Ophthalmology;  Laterality: Right;   CARDIAC CATHETERIZATION Left 10/14/2014   Procedure: Left Heart Cath and Coronary Angiography;  Surgeon: Iran Ouch, MD;  Location: [facility] IN[facility];  Service: Cardiovascular;  Laterality: Left;   CATARACT EXTRACTION W/PHACO Left 04/30/2016   Procedure: CATARACT EXTRACTION PHACO AND INTRAOCULAR LENS PLACEMENT (IOC);  Surgeon: Nevada Crane, MD;  Location: [facility];  Service: Ophthalmology;  Laterality: Left;  LEFT   CATARACT EXTRACTION W/PHACO  Right 07/16/2016   Procedure: CATARACT EXTRACTION PHACO AND INTRAOCULAR LENS PLACEMENT (IOC)  Right;  Surgeon: Nevada Crane, MD;  Location: [facility];  Service: Ophthalmology;  Laterality: Right;   CORONARY ARTERY BYPASS GRAFT N/A 10/14/2014   Procedure: CORONARY ARTERY BYPASS GRAFTING (CABG), ON PUMP, TIMES FOUR, USING LEFT INTERNAL MAMMARY ARTERY, RIGHT GREATER SAPHENOUS VEIN HARVESTED ENDOSCOPICALLY;  Surgeon: Alleen Borne, MD;  Location: [facility] OR;  Service: Open Heart Surgery;  Laterality: N/A;   CORONARY ARTERY BYPASS GRAFT  10/2014   LIMA-->LAD, SVG-->RCA, sequential SVG-->Ramus and OM   HEMORRHOID SURGERY     HIP ARTHROPLASTY Left 01/14/2015   Procedure: ARTHROPLASTY BIPOLAR HIP (HEMIARTHROPLASTY);  Surgeon: Donato Heinz, MD;  Location: [facility] ORS;  Service: Orthopedics;  Laterality: Left;   VARICOSE VEIN SURGERY      OB History     Gravida  0   Para  0   Term  0   Preterm  0   AB  0   Living         SAB  0   IAB  0   Ectopic  0   Multiple      Live Births               Home Medications    Prior to Admission medications   Medication Sig Start Date End Date Taking? Authorizing Provider  acetaminophen (TYLENOL) 325 MG tablet Take 2 tablets (650 mg total) by mouth every 6 (six) hours as needed for mild pain. 10/21/14  Yes Doree Fudge M, PA-C  aspirin 81 MG chewable tablet Chew 1 tablet (81 mg total) by mouth daily. 01/19/15  Yes Enid Baas, MD  atorvastatin (LIPITOR) 40 MG tablet TAKE 1 TABLET(40 MG) BY MOUTH DAILY 12/24/22  Yes Sherlene Shams, MD  Calcium Carbonate-Vitamin D 600-200 MG-UNIT TABS Take 1 tablet by mouth 2 (two) times daily.   Yes [provider]  carvedilol (COREG) 3.125 MG tablet TAKE 1 TABLET(3.125 MG) BY MOUTH TWICE DAILY WITH A MEAL 09/20/22  Yes Sherlene Shams, MD  levothyroxine (SYNTHROID) 25 MCG tablet TAKE 1 TABLET(25 MCG) BY MOUTH DAILY BEFORE BREAKFAST 09/13/22  Yes Sherlene Shams, MD  losartan  (COZAAR) 50 MG tablet Take 1.5 tablets (75 mg total) by mouth daily. 11/01/22  Yes Creig Hines, NP  mirabegron ER (MYRBETRIQ) 25 MG TB24 tablet Take 1 tablet (25 mg total) by mouth daily. 10/02/22  Yes Sherlene Shams, MD  Multiple Vitamin (MULTIVITAMIN WITH MINERALS) TABS tablet Take 1 tablet by mouth daily.   Yes [provider]  Multiple Vitamins-Minerals (PRESERVISION AREDS 2) CAPS Take 1 capsule by mouth 2 (two) times daily.    Yes [provider]  pantoprazole (PROTONIX) 40 MG tablet Take 1 tablet (40 mg total) by mouth daily. 01/25/22  Yes Sherlene Shams, MD  senna-docusate (SENOKOT-S) 8.6-50 MG per tablet Take 1 tablet by mouth 2 (two)  times daily. 01/19/15  Yes Enid Baas, MD  torsemide (DEMADEX) 10 MG tablet Take 1 tablet daily as needed for shortness of breath or weight gain 3 lbs or greater overnight. 01/29/23  Yes Sherlene Shams, MD  Ferrous Sulfate Dried (SLOW RELEASE IRON) 45 MG TBCR Take 1 tablet by mouth daily with breakfast. Patient not taking: Reported on 01/01/2023 01/26/22   Sherlene Shams, MD  citalopram (CELEXA) 20 MG tablet Take 20 mg by mouth daily.    08/28/11  [provider]    Family History Family History  Problem Relation Age of Onset   Coronary artery disease Mother    Diabetes Mother    Heart disease Father    Coronary artery disease Brother    Breast cancer Neg Hx     Social History Social History   Tobacco Use   Smoking status: Never   Smokeless tobacco: Never  Vaping Use   Vaping status: Never Used  Substance Use Topics   Alcohol use: No   Drug use: No     Allergies   Ambien [zolpidem], Codeine, and Lasix [furosemide]   Review of Systems Review of Systems   Physical Exam Triage Vital Signs ED Triage Vitals  Encounter Vitals Group     BP 02/11/23 1248 (!) 182/67     Systolic BP Percentile --      Diastolic BP Percentile --      Pulse Rate 02/11/23 1248 66     Resp 02/11/23 1248 19      Temp 02/11/23 1248 98.2 F (36.8 C)     Temp Source 02/11/23 1248 Oral     SpO2 02/11/23 1248 97 %     Weight 02/11/23 1247 157 lb (71.2 kg)     Height --      Head Circumference --      Peak Flow --      Pain Score 02/11/23 1247 8     Pain Loc --      Pain Education --      Exclude from Growth Chart --    No data found.  Updated Vital Signs BP (!) 182/67 (BP Location: Left Arm)   Pulse 66   Temp 98.2 F (36.8 C) (Oral)   Resp 19   Wt 157 lb (71.2 kg)   SpO2 97%   BMI 29.66 kg/m   Visual Acuity Right Eye Distance:   Left Eye Distance:   Bilateral Distance:    Right Eye Near:   Left Eye Near:    Bilateral Near:     Physical Exam Musculoskeletal:     Right knee: Swelling and deformity present.     Left knee: Swelling and deformity present.     Right lower leg: No swelling or tenderness. No edema.     Left lower leg: No swelling or tenderness. No edema.     Comments: Knee swelling is present. Posterior R knee tenderness with palpation, especially laterally.      UC Treatments / Results  Labs (all labs ordered are listed, but only abnormal results are displayed) Labs Reviewed - No data to display  EKG   Radiology No results found.  Procedures Procedures (including critical care time)  Medications Ordered in UC Medications - No data to display  Initial Impression / Assessment and Plan / UC Course  I have reviewed the triage vital signs and the nursing notes.  Pertinent labs & imaging results that were available during my care of the patient were  reviewed by me and considered in my medical decision making (see chart for details).   Renia Seraiah Cook is a 89 y.o. female presenting with subacute R knee pain. Patient is afebrile without recent antipyretics, satting well on room air. Overall is very well appearing, well hydrated, without respiratory distress. Pulmonary exam is unremarkable.  Lungs CTAB without wheezing, rhonchi, rales. RRR without murmurs,  rubs, gallops. She has no R calf tenderness with palpation and there is no R calf swelling compared to the L side. There are significant varicosities present in lower extremities bilaterally, L > R.  Reviewed relevant chart history. Additional history obtained from patient family/caregiver present during the exam.  X-rays of her knee were obtained.  Suspect age related osteoarthritis and recommending follow-up with her PCP and referral to an orthopedic provider if appropriate.  Counseled patient on potential for adverse effects with medications prescribed/recommended today, ER and return-to-clinic precautions discussed, patient verbalized understanding and agreement with care plan.  Final Clinical Impressions(s) / UC Diagnoses   Final diagnoses:  None   Discharge Instructions   None    ED Prescriptions   None    PDMP not reviewed this encounter.   Charma Igo, [place] 02/11/23 1355

## 2023-02-11 NOTE — Discharge Instructions (Addendum)
Results of your knee x-ray were not available at the time of your discharge.  They will be posted to your MyChart account and your primary care provider will have access to it as well.  I am recommending evaluation by an orthopedic provider.  You may use a local urgent care orthopedic provider such as [facility] or request referral from your PCP.  As we discussed, I am somewhat concerned about the possibility of a blood clot.  The results of today's testing will be available in your chart.  You will receive a telephone call if the results indicate the possibility of a clot.  If you develop worsening symptoms including shortness of breath or difficulty breathing, recommend immediate evaluation in the emergency room.  Because of your age and other medical issues, I cannot recommend any medication to treat your pain other than Tylenol.

## 2023-02-11 NOTE — ED Triage Notes (Signed)
Right knee pain. Off and on for several weeks.

## 2023-02-12 ENCOUNTER — Telehealth: Payer: Self-pay | Admitting: Internal Medicine

## 2023-02-12 ENCOUNTER — Ambulatory Visit: Payer: Medicare HMO

## 2023-02-12 DIAGNOSIS — R9431 Abnormal electrocardiogram [ECG] [EKG]: Secondary | ICD-10-CM | POA: Diagnosis not present

## 2023-02-12 DIAGNOSIS — M25561 Pain in right knee: Secondary | ICD-10-CM | POA: Diagnosis not present

## 2023-02-12 DIAGNOSIS — M79651 Pain in right thigh: Secondary | ICD-10-CM | POA: Diagnosis not present

## 2023-02-12 DIAGNOSIS — Z789 Other specified health status: Secondary | ICD-10-CM | POA: Diagnosis not present

## 2023-02-12 DIAGNOSIS — I110 Hypertensive heart disease with heart failure: Secondary | ICD-10-CM | POA: Diagnosis not present

## 2023-02-12 DIAGNOSIS — I509 Heart failure, unspecified: Secondary | ICD-10-CM | POA: Diagnosis not present

## 2023-02-12 DIAGNOSIS — M7989 Other specified soft tissue disorders: Secondary | ICD-10-CM | POA: Diagnosis not present

## 2023-02-12 DIAGNOSIS — M25461 Effusion, right knee: Secondary | ICD-10-CM | POA: Diagnosis not present

## 2023-02-12 DIAGNOSIS — M25661 Stiffness of right knee, not elsewhere classified: Secondary | ICD-10-CM | POA: Diagnosis not present

## 2023-02-12 DIAGNOSIS — R2241 Localized swelling, mass and lump, right lower limb: Secondary | ICD-10-CM | POA: Diagnosis not present

## 2023-02-12 DIAGNOSIS — R262 Difficulty in walking, not elsewhere classified: Secondary | ICD-10-CM | POA: Diagnosis not present

## 2023-02-12 NOTE — Telephone Encounter (Signed)
LMTCB to follow up and see if pt was going to the the ED.

## 2023-02-12 NOTE — Telephone Encounter (Signed)
Pt is scheduled for an appt with [facility].

## 2023-02-12 NOTE — Telephone Encounter (Signed)
  Symptoms: Pain in right leg. Back in right knee Went to Urgent Care yesterday. Pain was terrible. Can't bend her right knee and lower leg. Pain in both legs. Possible blood clot    Attributing factors (medication changes, positional changes, etc. )  No     Duration :     Pain Scale?  On 1-10 how woiuld you rate your pain? What makes it better or worse?  8    Blood pressure            Pulse             Temp

## 2023-02-13 DIAGNOSIS — M25561 Pain in right knee: Secondary | ICD-10-CM | POA: Diagnosis not present

## 2023-02-24 ENCOUNTER — Ambulatory Visit (INDEPENDENT_AMBULATORY_CARE_PROVIDER_SITE_OTHER): Payer: Medicare HMO | Admitting: Internal Medicine

## 2023-02-24 ENCOUNTER — Encounter: Payer: Self-pay | Admitting: Internal Medicine

## 2023-02-24 VITALS — BP 150/80 | HR 70 | Ht 61.0 in | Wt 158.8 lb

## 2023-02-24 DIAGNOSIS — E669 Obesity, unspecified: Secondary | ICD-10-CM

## 2023-02-24 DIAGNOSIS — Z23 Encounter for immunization: Secondary | ICD-10-CM

## 2023-02-24 DIAGNOSIS — I10 Essential (primary) hypertension: Secondary | ICD-10-CM | POA: Diagnosis not present

## 2023-02-24 DIAGNOSIS — R531 Weakness: Secondary | ICD-10-CM

## 2023-02-24 DIAGNOSIS — M25562 Pain in left knee: Secondary | ICD-10-CM

## 2023-02-24 DIAGNOSIS — E785 Hyperlipidemia, unspecified: Secondary | ICD-10-CM

## 2023-02-24 DIAGNOSIS — M25561 Pain in right knee: Secondary | ICD-10-CM | POA: Diagnosis not present

## 2023-02-24 DIAGNOSIS — E034 Atrophy of thyroid (acquired): Secondary | ICD-10-CM

## 2023-02-24 MED ORDER — ONDANSETRON HCL 4 MG PO TABS: 4.0000 mg | ORAL_TABLET | Freq: Three times a day (TID) | ORAL | 0 refills | Status: DC | PRN

## 2023-02-24 MED ORDER — TORSEMIDE 10 MG PO TABS
ORAL_TABLET | ORAL | 2 refills | Status: DC
Start: 2023-02-24 — End: 2023-03-12

## 2023-02-24 MED ORDER — LOSARTAN POTASSIUM 100 MG PO TABS: 100.0000 mg | ORAL_TABLET | Freq: Every day | ORAL | 1 refills | Status: DC

## 2023-02-24 MED ORDER — PANTOPRAZOLE SODIUM 40 MG PO TBEC: 40.0000 mg | DELAYED_RELEASE_TABLET | Freq: Every day | ORAL | 3 refills | Status: DC

## 2023-02-24 MED ORDER — MIRABEGRON ER 25 MG PO TB24: 25.0000 mg | ORAL_TABLET | Freq: Every day | ORAL | 5 refills | Status: DC

## 2023-02-24 MED ORDER — TRAMADOL HCL 50 MG PO TABS: 50.0000 mg | ORAL_TABLET | Freq: Four times a day (QID) | ORAL | 0 refills | Status: DC | PRN

## 2023-02-24 NOTE — Assessment & Plan Note (Signed)
Not at goal Increase losartan to 100 mg daily.

## 2023-02-24 NOTE — Progress Notes (Signed)
Subjective:  Patient ID: Jenna Marshall, female    DOB: 11-15-1925  Age: 89 y.o. MRN: [id number]  CC: The primary encounter diagnosis was Hypothyroidism due to acquired atrophy of thyroid. Diagnoses of Essential hypertension, Hyperlipidemia, unspecified hyperlipidemia type, Obesity (BMI 30-39.9), Need for influenza vaccination, Elevated blood pressure reading with diagnosis of hypertension, General weakness, and Pain in both knees, unspecified chronicity were also pertinent to this visit.   HPI Jenna Marshall presents for  Chief Complaint  Patient presents with   Medical Management of Chronic Issues   1) Right knee Pain: Jenna Marshall developed an acutely painful swollen knee and was evaluated at [facility] ER on Sept 11 with ultrasound  to rule out DVT.  She was referred for home   PT,  OT.  But has not bee contacted uet.  Her pain is currently resolved and she is using a rolling walker and ha resumed the exercises that her previous PT had prescribed for home use.  She has no history of gout or DVT.   2) HYPERTENSION : CHECKS BP AT HOME 162/80.  150/78.  Taking carvedilol 3.125 mg aNd losartan at submaximal doses.     Outpatient Medications Prior to Visit  Medication Sig Dispense Refill   acetaminophen (TYLENOL) 325 MG tablet Take 2 tablets (650 mg total) by mouth every 6 (six) hours as needed for mild pain.     aspirin 81 MG chewable tablet Chew 1 tablet (81 mg total) by mouth daily. 30 tablet 0   atorvastatin (LIPITOR) 40 MG tablet TAKE 1 TABLET(40 MG) BY MOUTH DAILY 90 tablet 3   Calcium Carbonate-Vitamin D 600-200 MG-UNIT TABS Take 1 tablet by mouth 2 (two) times daily.     carvedilol (COREG) 3.125 MG tablet TAKE 1 TABLET(3.125 MG) BY MOUTH TWICE DAILY WITH A MEAL 180 tablet 3   diclofenac Sodium (VOLTAREN) 1 % GEL Apply topically 4 (four) times daily.     FIBER PO Take 1 capsule by mouth daily.     levothyroxine (SYNTHROID) 25 MCG tablet TAKE 1 TABLET(25 MCG) BY MOUTH DAILY BEFORE  BREAKFAST 90 tablet 3   Multiple Vitamin (MULTIVITAMIN WITH MINERALS) TABS tablet Take 1 tablet by mouth daily.     Multiple Vitamins-Minerals (PRESERVISION AREDS 2) CAPS Take 1 capsule by mouth 2 (two) times daily.      senna-docusate (SENOKOT-S) 8.6-50 MG per tablet Take 1 tablet by mouth 2 (two) times daily. 30 tablet 0   losartan (COZAAR) 50 MG tablet Take 1.5 tablets (75 mg total) by mouth daily. 135 tablet 3   mirabegron ER (MYRBETRIQ) 25 MG TB24 tablet Take 1 tablet (25 mg total) by mouth daily. 30 tablet 5   pantoprazole (PROTONIX) 40 MG tablet Take 1 tablet (40 mg total) by mouth daily. 90 tablet 3   torsemide (DEMADEX) 10 MG tablet Take 1 tablet daily as needed for shortness of breath or weight gain 3 lbs or greater overnight. 30 tablet 2   Ferrous Sulfate Dried (SLOW RELEASE IRON) 45 MG TBCR Take 1 tablet by mouth daily with breakfast. (Patient not taking: Reported on 01/01/2023) 90 tablet 1   No facility-administered medications prior to visit.    Review of Systems;  Patient denies headache, fevers, malaise, unintentional weight loss, skin rash, eye pain, sinus congestion and sinus pain, sore throat, dysphagia,  hemoptysis , cough, dyspnea, wheezing, chest pain, palpitations, orthopnea, edema, abdominal pain, nausea, melena, diarrhea, constipation, flank pain, dysuria, hematuria, urinary  Frequency, nocturia, numbness, tingling, seizures,  Focal weakness, Loss of consciousness,  Tremor, insomnia, depression, anxiety, and suicidal ideation.      Objective:  BP (!) 150/80   Pulse 70   Ht 5\' 1"  (1.549 m)   Wt 158 lb 12.8 oz (72 kg)   SpO2 96%   BMI 30.00 kg/m   BP Readings from Last 3 Encounters:  02/24/23 (!) 150/80  02/11/23 (!) 182/67  11/01/22 (!) 180/76    Wt Readings from Last 3 Encounters:  02/24/23 158 lb 12.8 oz (72 kg)  02/11/23 157 lb (71.2 kg)  01/01/23 156 lb (70.8 kg)    Physical Exam Vitals reviewed.  Constitutional:      General: She is not in acute  distress.    Appearance: Normal appearance. She is normal weight. She is not ill-appearing, toxic-appearing or diaphoretic.  HENT:     Head: Normocephalic.  Eyes:     General: No scleral icterus.       Right eye: No discharge.        Left eye: No discharge.     Conjunctiva/sclera: Conjunctivae normal.  Cardiovascular:     Rate and Rhythm: Normal rate and regular rhythm.     Heart sounds: Normal heart sounds.  Pulmonary:     Effort: Pulmonary effort is normal. No respiratory distress.     Breath sounds: Normal breath sounds.  Musculoskeletal:        General: Normal range of motion.  Skin:    General: Skin is warm and dry.  Neurological:     General: No focal deficit present.     Mental Status: She is alert and oriented to person, place, and time. Mental status is at baseline.  Psychiatric:        Mood and Affect: Mood normal.        Behavior: Behavior normal.        Thought Content: Thought content normal.        Judgment: Judgment normal.    Lab Results  Component Value Date   HGBA1C 6.0 01/25/2022    Lab Results  Component Value Date   CREATININE 0.80 07/05/2022   CREATININE 0.81 01/25/2022   CREATININE 0.70 12/17/2021    Lab Results  Component Value Date   WBC 5.7 07/05/2022   HGB 12.0 07/05/2022   HCT 35.3 (L) 07/05/2022   PLT 163.0 07/05/2022   GLUCOSE 102 (H) 07/05/2022   CHOL 140 07/05/2022   TRIG 76.0 07/05/2022   HDL 51.30 07/05/2022   LDLDIRECT 72.0 07/05/2022   LDLCALC 73 07/05/2022   ALT 11 07/05/2022   AST 19 07/05/2022   NA 139 07/05/2022   K 4.7 07/05/2022   CL 102 07/05/2022   CREATININE 0.80 07/05/2022   BUN 26 (H) 07/05/2022   CO2 28 07/05/2022   TSH 3.53 07/05/2022   INR 1.64 (H) 10/14/2014   HGBA1C 6.0 01/25/2022   MICROALBUR <0.7 01/25/2022    DG Knee Complete 4 Views Right  Result Date: 02/11/2023 CLINICAL DATA:  Chronic R knee pain EXAM: RIGHT KNEE - COMPLETE 4+ VIEW COMPARISON:  04/05/2013 FINDINGS: no fracture or  dislocation. No significant effusion. Mild osteopenia. Mild marginal spurring about all 3 compartments with mild narrowing laterally. Patchy popliteal arterial calcifications. IMPRESSION: Mild tricompartmental degenerative changes. No acute findings. Electronically Signed   By: Corlis Leak M.D.   On: 02/11/2023 15:08    Assessment & Plan:  .Hypothyroidism due to acquired atrophy of thyroid  Essential hypertension  Hyperlipidemia, unspecified hyperlipidemia type  Obesity (BMI 30-39.9)  Need for influenza vaccination -     Flu Vaccine Trivalent High Dose (Fluad)  Elevated blood pressure reading with diagnosis of hypertension Assessment & Plan: Not at goal.  Increase losartan to 100 mg daily    General weakness Assessment & Plan: Secondary to deconditioning. She has deferred home PT referral and prefers to resume previously p rescribed exercises   Pain in both knees, unspecified chronicity Assessment & Plan: Continue symptom control with Tylenol,  diclofenac gel and tramadol for severe pain    Other orders -     Mirabegron ER; Take 1 tablet (25 mg total) by mouth daily.  Dispense: 30 tablet; Refill: 5 -     Pantoprazole Sodium; Take 1 tablet (40 mg total) by mouth daily.  Dispense: 90 tablet; Refill: 3 -     Torsemide; Take 1 tablet daily as needed for shortness of breath or weight gain 3 lbs or greater overnight.  Dispense: 30 tablet; Refill: 2 -     Losartan Potassium; Take 1 tablet (100 mg total) by mouth daily.  Dispense: 90 tablet; Refill: 1 -     traMADol HCl; Take 1 tablet (50 mg total) by mouth every 6 (six) hours as needed for up to 7 days.  Dispense: 28 tablet; Refill: 0 -     Ondansetron HCl; Take 1 tablet (4 mg total) by mouth every 8 (eight) hours as needed for nausea or vomiting.  Dispense: 20 tablet; Refill: 0     I provided 34 minutes of face-to-face time during this encounter reviewing patient's  recent ER visit , previous  labs and imaging studies, counseling on  currently addressed issues,  and post visit ordering to diagnostics and therapeutics .   Follow-up: No follow-ups on file.   Sherlene Shams, MD

## 2023-02-24 NOTE — Assessment & Plan Note (Signed)
Continue symptom control with Tylenol,  diclofenac gel and tramadol for severe pain

## 2023-02-24 NOTE — Assessment & Plan Note (Signed)
Secondary to deconditioning. She has deferred home PT referral and prefers to resume previously p rescribed exercises

## 2023-02-24 NOTE — Patient Instructions (Addendum)
I HAVE REVIEWED THE LABS THAT WERE DONE DURING YOUR ER VISIT, SO YOU DO NOT NEED ANY TODAY  I am prescribing a strong pain reliever called  tramadol, which is a controlled substance.   This is for SEVERE PAIN and should be taken if the tylenol ,voltaren and ice do not control your pain and swelling.  You can take it every 6 to 12 hours and you will need to take zofran first to avoid nausea   Continue using voltaren gel every 6 hours   Resume all the exercises your previous physical therapist gave you to do   Your blood pressure needs to be lower.  I have increased the losartan dose to 100 mg daily

## 2023-03-01 ENCOUNTER — Inpatient Hospital Stay
Admission: EM | Admit: 2023-03-01 | Discharge: 2023-03-12 | DRG: 177 | Disposition: A | Discharge status: Skilled Nursing Facility | Payer: Medicare HMO | Attending: Internal Medicine | Admitting: Internal Medicine

## 2023-03-01 ENCOUNTER — Encounter: Payer: Self-pay | Admitting: Emergency Medicine

## 2023-03-01 ENCOUNTER — Emergency Department: Payer: Medicare HMO

## 2023-03-01 DIAGNOSIS — D696 Thrombocytopenia, unspecified: Secondary | ICD-10-CM | POA: Diagnosis present

## 2023-03-01 DIAGNOSIS — E039 Hypothyroidism, unspecified: Secondary | ICD-10-CM | POA: Diagnosis present

## 2023-03-01 DIAGNOSIS — W19XXXA Unspecified fall, initial encounter: Secondary | ICD-10-CM | POA: Diagnosis not present

## 2023-03-01 DIAGNOSIS — K219 Gastro-esophageal reflux disease without esophagitis: Secondary | ICD-10-CM | POA: Diagnosis not present

## 2023-03-01 DIAGNOSIS — H9190 Unspecified hearing loss, unspecified ear: Secondary | ICD-10-CM | POA: Diagnosis present

## 2023-03-01 DIAGNOSIS — A419 Sepsis, unspecified organism: Secondary | ICD-10-CM | POA: Diagnosis present

## 2023-03-01 DIAGNOSIS — I959 Hypotension, unspecified: Secondary | ICD-10-CM | POA: Diagnosis not present

## 2023-03-01 DIAGNOSIS — I2510 Atherosclerotic heart disease of native coronary artery without angina pectoris: Secondary | ICD-10-CM | POA: Diagnosis present

## 2023-03-01 DIAGNOSIS — Z885 Allergy status to narcotic agent status: Secondary | ICD-10-CM

## 2023-03-01 DIAGNOSIS — Z833 Family history of diabetes mellitus: Secondary | ICD-10-CM

## 2023-03-01 DIAGNOSIS — Z951 Presence of aortocoronary bypass graft: Secondary | ICD-10-CM | POA: Diagnosis not present

## 2023-03-01 DIAGNOSIS — Z8249 Family history of ischemic heart disease and other diseases of the circulatory system: Secondary | ICD-10-CM

## 2023-03-01 DIAGNOSIS — R531 Weakness: Secondary | ICD-10-CM | POA: Diagnosis not present

## 2023-03-01 DIAGNOSIS — A4189 Other specified sepsis: Secondary | ICD-10-CM | POA: Diagnosis not present

## 2023-03-01 DIAGNOSIS — D509 Iron deficiency anemia, unspecified: Secondary | ICD-10-CM | POA: Diagnosis not present

## 2023-03-01 DIAGNOSIS — J9601 Acute respiratory failure with hypoxia: Secondary | ICD-10-CM | POA: Diagnosis present

## 2023-03-01 DIAGNOSIS — U071 COVID-19: Principal | ICD-10-CM | POA: Diagnosis present

## 2023-03-01 DIAGNOSIS — R918 Other nonspecific abnormal finding of lung field: Secondary | ICD-10-CM | POA: Diagnosis not present

## 2023-03-01 DIAGNOSIS — Z79890 Hormone replacement therapy (postmenopausal): Secondary | ICD-10-CM

## 2023-03-01 DIAGNOSIS — E785 Hyperlipidemia, unspecified: Secondary | ICD-10-CM | POA: Diagnosis not present

## 2023-03-01 DIAGNOSIS — Z96642 Presence of left artificial hip joint: Secondary | ICD-10-CM | POA: Diagnosis present

## 2023-03-01 DIAGNOSIS — Z79899 Other long term (current) drug therapy: Secondary | ICD-10-CM

## 2023-03-01 DIAGNOSIS — Z888 Allergy status to other drugs, medicaments and biological substances status: Secondary | ICD-10-CM | POA: Diagnosis not present

## 2023-03-01 DIAGNOSIS — R051 Acute cough: Secondary | ICD-10-CM

## 2023-03-01 DIAGNOSIS — R0902 Hypoxemia: Secondary | ICD-10-CM | POA: Diagnosis not present

## 2023-03-01 DIAGNOSIS — K5900 Constipation, unspecified: Secondary | ICD-10-CM | POA: Diagnosis present

## 2023-03-01 DIAGNOSIS — R059 Cough, unspecified: Secondary | ICD-10-CM | POA: Diagnosis present

## 2023-03-01 DIAGNOSIS — J189 Pneumonia, unspecified organism: Secondary | ICD-10-CM | POA: Diagnosis not present

## 2023-03-01 DIAGNOSIS — Z7982 Long term (current) use of aspirin: Secondary | ICD-10-CM

## 2023-03-01 DIAGNOSIS — Z751 Person awaiting admission to adequate facility elsewhere: Secondary | ICD-10-CM

## 2023-03-01 DIAGNOSIS — Z20822 Contact with and (suspected) exposure to covid-19: Secondary | ICD-10-CM | POA: Diagnosis not present

## 2023-03-01 DIAGNOSIS — I10 Essential (primary) hypertension: Secondary | ICD-10-CM | POA: Diagnosis not present

## 2023-03-01 DIAGNOSIS — Z86718 Personal history of other venous thrombosis and embolism: Secondary | ICD-10-CM

## 2023-03-01 DIAGNOSIS — J1282 Pneumonia due to coronavirus disease 2019: Secondary | ICD-10-CM | POA: Diagnosis not present

## 2023-03-01 DIAGNOSIS — Z66 Do not resuscitate: Secondary | ICD-10-CM | POA: Diagnosis not present

## 2023-03-01 DIAGNOSIS — J984 Other disorders of lung: Secondary | ICD-10-CM | POA: Diagnosis not present

## 2023-03-01 DIAGNOSIS — D508 Other iron deficiency anemias: Secondary | ICD-10-CM | POA: Diagnosis not present

## 2023-03-01 DIAGNOSIS — R0689 Other abnormalities of breathing: Secondary | ICD-10-CM | POA: Diagnosis not present

## 2023-03-01 DIAGNOSIS — Z7401 Bed confinement status: Secondary | ICD-10-CM | POA: Diagnosis not present

## 2023-03-01 DIAGNOSIS — R011 Cardiac murmur, unspecified: Secondary | ICD-10-CM | POA: Diagnosis not present

## 2023-03-01 DIAGNOSIS — K5909 Other constipation: Secondary | ICD-10-CM | POA: Diagnosis not present

## 2023-03-01 LAB — URINALYSIS, W/ REFLEX TO CULTURE (INFECTION SUSPECTED)
Bilirubin Urine: NEGATIVE
Glucose, UA: NEGATIVE mg/dL
Hgb urine dipstick: NEGATIVE
Ketones, ur: 20 mg/dL — AB
Nitrite: NEGATIVE
Protein, ur: 30 mg/dL — AB
Specific Gravity, Urine: 1.024 (ref 1.005–1.030)
pH: 5 (ref 5.0–8.0)

## 2023-03-01 LAB — CBC WITH DIFFERENTIAL/PLATELET
Abs Immature Granulocytes: 0.03 10*3/uL (ref 0.00–0.07)
Basophils Absolute: 0 10*3/uL (ref 0.0–0.1)
Basophils Relative: 0 %
Eosinophils Absolute: 0 10*3/uL (ref 0.0–0.5)
Eosinophils Relative: 0 %
HCT: 32.9 % — ABNORMAL LOW (ref 36.0–46.0)
Hemoglobin: 10.6 g/dL — ABNORMAL LOW (ref 12.0–15.0)
Immature Granulocytes: 0 %
Lymphocytes Relative: 9 %
Lymphs Abs: 0.6 10*3/uL — ABNORMAL LOW (ref 0.7–4.0)
MCH: 31.4 pg (ref 26.0–34.0)
MCHC: 32.2 g/dL (ref 30.0–36.0)
MCV: 97.3 fL (ref 80.0–100.0)
Monocytes Absolute: 1 10*3/uL (ref 0.1–1.0)
Monocytes Relative: 13 %
Neutro Abs: 5.6 10*3/uL (ref 1.7–7.7)
Neutrophils Relative %: 78 %
Platelets: 129 10*3/uL — ABNORMAL LOW (ref 150–400)
RBC: 3.38 MIL/uL — ABNORMAL LOW (ref 3.87–5.11)
RDW: 14.4 % (ref 11.5–15.5)
WBC: 7.3 10*3/uL (ref 4.0–10.5)
nRBC: 0 % (ref 0.0–0.2)

## 2023-03-01 LAB — TROPONIN I (HIGH SENSITIVITY): Troponin I (High Sensitivity): 37 ng/L — ABNORMAL HIGH (ref <18)

## 2023-03-01 LAB — COMPREHENSIVE METABOLIC PANEL
ALT: 13 U/L (ref 0–44)
AST: 20 U/L (ref 15–41)
Albumin: 3.5 g/dL (ref 3.5–5.0)
Alkaline Phosphatase: 46 U/L (ref 38–126)
Anion gap: 8 (ref 5–15)
BUN: 21 mg/dL (ref 8–23)
CO2: 27 mmol/L (ref 22–32)
Calcium: 8.6 mg/dL — ABNORMAL LOW (ref 8.9–10.3)
Chloride: 100 mmol/L (ref 98–111)
Creatinine, Ser: 0.77 mg/dL (ref 0.44–1.00)
GFR, Estimated: >60 mL/min (ref >60)
Glucose, Bld: 120 mg/dL — ABNORMAL HIGH (ref 70–99)
Potassium: 3.9 mmol/L (ref 3.5–5.1)
Sodium: 135 mmol/L (ref 135–145)
Total Bilirubin: 0.7 mg/dL (ref 0.3–1.2)
Total Protein: 6.4 g/dL — ABNORMAL LOW (ref 6.5–8.1)

## 2023-03-01 LAB — SARS CORONAVIRUS 2 BY RT PCR: SARS Coronavirus 2 by RT PCR: POSITIVE — AB

## 2023-03-01 LAB — LACTIC ACID, PLASMA: Lactic Acid, Venous: 1.1 mmol/L (ref 0.5–1.9)

## 2023-03-01 MED ORDER — ATORVASTATIN CALCIUM 20 MG PO TABS
40.0000 mg | ORAL_TABLET | Freq: Every day | ORAL | Status: DC
  Administered 2023-03-02 – 2023-03-05 (×4): 40 mg via ORAL
  Filled 2023-03-01 (×4): qty 2

## 2023-03-01 MED ORDER — LOSARTAN POTASSIUM 50 MG PO TABS
100.0000 mg | ORAL_TABLET | Freq: Every day | ORAL | Status: DC
  Administered 2023-03-02 – 2023-03-12 (×11): 100 mg via ORAL
  Filled 2023-03-01 (×11): qty 2

## 2023-03-01 MED ORDER — CARVEDILOL 3.125 MG PO TABS
3.1250 mg | ORAL_TABLET | Freq: Two times a day (BID) | ORAL | Status: DC
  Administered 2023-03-02 – 2023-03-12 (×21): 3.125 mg via ORAL
  Filled 2023-03-01 (×21): qty 1

## 2023-03-01 MED ORDER — ONDANSETRON HCL 4 MG PO TABS
4.0000 mg | ORAL_TABLET | Freq: Three times a day (TID) | ORAL | Status: DC | PRN
  Administered 2023-03-04 – 2023-03-11 (×4): 4 mg via ORAL
  Filled 2023-03-01 (×4): qty 1

## 2023-03-01 MED ORDER — MIRABEGRON ER 25 MG PO TB24
25.0000 mg | ORAL_TABLET | Freq: Every day | ORAL | Status: DC
  Administered 2023-03-02 – 2023-03-12 (×11): 25 mg via ORAL
  Filled 2023-03-01 (×11): qty 1

## 2023-03-01 MED ORDER — LEVOTHYROXINE SODIUM 25 MCG PO TABS
25.0000 ug | ORAL_TABLET | Freq: Every day | ORAL | Status: DC
  Administered 2023-03-02 – 2023-03-12 (×11): 25 ug via ORAL
  Filled 2023-03-01 (×11): qty 1

## 2023-03-01 MED ORDER — SODIUM CHLORIDE 0.9 % IV SOLN
500.0000 mg | INTRAVENOUS | Status: DC
  Administered 2023-03-02: 500 mg via INTRAVENOUS
  Filled 2023-03-01: qty 5

## 2023-03-01 MED ORDER — PANTOPRAZOLE SODIUM 40 MG IV SOLR
40.0000 mg | Freq: Two times a day (BID) | INTRAVENOUS | Status: DC
  Administered 2023-03-02: 40 mg via INTRAVENOUS
  Filled 2023-03-01: qty 10

## 2023-03-01 MED ORDER — ASPIRIN 81 MG PO CHEW
81.0000 mg | CHEWABLE_TABLET | Freq: Every day | ORAL | Status: DC
  Administered 2023-03-02 – 2023-03-12 (×11): 81 mg via ORAL
  Filled 2023-03-01 (×11): qty 1

## 2023-03-01 MED ORDER — ACETAMINOPHEN 325 MG PO TABS
650.0000 mg | ORAL_TABLET | Freq: Four times a day (QID) | ORAL | Status: DC | PRN
  Administered 2023-03-05 – 2023-03-08 (×6): 650 mg via ORAL
  Filled 2023-03-01 (×6): qty 2

## 2023-03-01 MED ORDER — SODIUM CHLORIDE 0.9 % IV SOLN
2.0000 g | INTRAVENOUS | Status: DC
  Administered 2023-03-01: 2 g via INTRAVENOUS
  Filled 2023-03-01: qty 20

## 2023-03-01 NOTE — ED Notes (Signed)
Patient ambulatory with walker to toilet in room and back to bed with 1x assist. UA obtained. Reconnected to monitor, VSS.

## 2023-03-01 NOTE — Assessment & Plan Note (Signed)
Will d/w pharm about remdesivir  or  paxlovid and steroids.

## 2023-03-01 NOTE — Assessment & Plan Note (Signed)
Vitals:   03/01/23 2037 03/01/23 2100 03/01/23 2200 03/01/23 2230  BP: (!) 169/58 (!) 153/55 (!) 150/57 (!) 147/52   03/01/23 2300  BP: (!) 153/55  Continue losartan/ coreg.

## 2023-03-01 NOTE — Assessment & Plan Note (Signed)
2/2 To pneumonia. PRN antitussive with tessalon pearls.  Aspiration precaution.

## 2023-03-01 NOTE — Assessment & Plan Note (Signed)
No reports of any bleeding per patient.  No AC since several years.

## 2023-03-01 NOTE — Assessment & Plan Note (Signed)
Stable no chest pain. Cont asa/ coreg/ lipitor.

## 2023-03-01 NOTE — Assessment & Plan Note (Signed)
Continue levothyroxine 

## 2023-03-01 NOTE — H&P (Signed)
History and Physical    Patient: Jenna Marshall ZOX:[id number] DOB: [birth year] DOA: 03/01/2023 DOS: the patient was seen and examined on 03/01/2023 PCP: Sherlene Shams, MD  Patient coming from: Home   Chief Complaint:  Chief Complaint  Patient presents with   Weakness    HPI: Neka Bise is a 89 y.o. female with medical history significant for hypothyroidism/ h/o DVT , anemia h/o CAD coming for cough, not feeling well since Thursday. Pt denies any chest pain palpitation or bleeding.  Lives at home and has round the clock care. States she feel today but otherwise is stable and walks with limited difficulty due to her arthritis.  Pt is alert oriented in ED, NAD. Pt meets sepsis criteria on presentation.  Labs shows: glucose of 120 and normal electrolytes/ renal function and LFT. Mild TNI at 37 from Covid and sepsis. Lactic 1.1 and cbc shows anemia of 10.6 and thrombocytopenia.  In ed pt was started on CAP regimen.   Review of Systems: Review of Systems  Constitutional:  Positive for malaise/fatigue.  HENT:  Positive for congestion.   Respiratory:  Positive for cough and shortness of breath.   Musculoskeletal:  Positive for falls.  Neurological:  Positive for weakness.  All other systems reviewed and are negative.   Past Medical History:  Diagnosis Date   Aortic insufficiency    a. 03/2017 Echo: Mod AI; b. 02/2019 Echo: Mod AI.   Arthritis    CAD (coronary artery disease)    a. Lexiscan 10/13/14: mid anterior to apical & inf wall ischemia w/ WMA, mild to mod dep EF; b. Cath 10/2014: LM 50ost, LM 99, ost LAD 95, 40p/m, RCA 30p, 44m; b. 10/2014 CABG x 4 (LIMA->LAD, VG->RCA, VG->RI->OM).   Depression    Diastolic dysfunction    a. 10/2014 Echo: EF 55-60%, no RWMA, GR1DD; b. 03/2017 Echo: EF 60-65%, mild LVH, Gr2 DD; c. 02/2019 Echo: EF 55-60%, sev LVH. Diast dysfxn. No rwma. RVSP 41.62mmHg. Mildly dil LA. Mild to mod MR. Mod TR. Mod AI.   GERD (gastroesophageal reflux disease)     Hyperlipidemia    Hypertension    Hypothyroidism    Macular degeneration    pernicious anemia    Varicose veins    Wears dentures    partial upper   Wears hearing aid    right   Past Surgical History:  Procedure Laterality Date   ANTERIOR VITRECTOMY Right 07/16/2016   Procedure: ANTERIOR VITRECTOMY;  Surgeon: Nevada Crane, MD;  Location: [facility];  Service: Ophthalmology;  Laterality: Right;   CARDIAC CATHETERIZATION Left 10/14/2014   Procedure: Left Heart Cath and Coronary Angiography;  Surgeon: Iran Ouch, MD;  Location: [facility] INVASIVE CV LAB;  Service: Cardiovascular;  Laterality: Left;   CATARACT EXTRACTION W/PHACO Left 04/30/2016   Procedure: CATARACT EXTRACTION PHACO AND INTRAOCULAR LENS PLACEMENT (IOC);  Surgeon: Nevada Crane, MD;  Location: [facility] SUR[facility];  Service: Ophthalmology;  Laterality: Left;  LEFT   CATARACT EXTRACTION W/PHACO Right 07/16/2016   Procedure: CATARACT EXTRACTION PHACO AND INTRAOCULAR LENS PLACEMENT (IOC)  Right;  Surgeon: Nevada Crane, MD;  Location: [facility];  Service: Ophthalmology;  Laterality: Right;   CORONARY ARTERY BYPASS GRAFT N/A 10/14/2014   Procedure: CORONARY ARTERY BYPASS GRAFTING (CABG), ON PUMP, TIMES FOUR, USING LEFT INTERNAL MAMMARY ARTERY, RIGHT GREATER SAPHENOUS VEIN HARVESTED ENDOSCOPICALLY;  Surgeon: Alleen Borne, MD;  Location: [facility] OR;  Service: Open Heart Surgery;  Laterality: N/A;   CORONARY  ARTERY BYPASS GRAFT  10/2014   LIMA-->LAD, SVG-->RCA, sequential SVG-->Ramus and OM   HEMORRHOID SURGERY     HIP ARTHROPLASTY Left 01/14/2015   Procedure: ARTHROPLASTY BIPOLAR HIP (HEMIARTHROPLASTY);  Surgeon: Donato Heinz, MD;  Location: [facility] ORS;  Service: Orthopedics;  Laterality: Left;   VARICOSE VEIN SURGERY     Social History:   reports that she has never smoked. She has never used smokeless tobacco. She reports that she does not drink alcohol and does not use drugs.  Allergies  Allergen  Reactions   Ambien [Zolpidem] Other (See Comments)    Reaction:  Hallucinations   Codeine Nausea And Vomiting   Lasix [Furosemide] Itching and Rash    Family History  Problem Relation Age of Onset   Coronary artery disease Mother    Diabetes Mother    Heart disease Father    Coronary artery disease Brother    Breast cancer Neg Hx     Prior to Admission medications   Medication Sig Start Date End Date Taking? Authorizing Provider  acetaminophen (TYLENOL) 325 MG tablet Take 2 tablets (650 mg total) by mouth every 6 (six) hours as needed for mild pain. 10/21/14   Ardelle Balls, PA-C  aspirin 81 MG chewable tablet Chew 1 tablet (81 mg total) by mouth daily. 01/19/15   Enid Baas, MD  atorvastatin (LIPITOR) 40 MG tablet TAKE 1 TABLET(40 MG) BY MOUTH DAILY 12/24/22   Sherlene Shams, MD  Calcium Carbonate-Vitamin D 600-200 MG-UNIT TABS Take 1 tablet by mouth 2 (two) times daily.    [provider]  carvedilol (COREG) 3.125 MG tablet TAKE 1 TABLET(3.125 MG) BY MOUTH TWICE DAILY WITH A MEAL 09/20/22   Sherlene Shams, MD  diclofenac Sodium (VOLTAREN) 1 % GEL Apply topically 4 (four) times daily.    [provider]  FIBER PO Take 1 capsule by mouth daily.    [provider]  levothyroxine (SYNTHROID) 25 MCG tablet TAKE 1 TABLET(25 MCG) BY MOUTH DAILY BEFORE BREAKFAST 09/13/22   Sherlene Shams, MD  losartan (COZAAR) 100 MG tablet Take 1 tablet (100 mg total) by mouth daily. 02/24/23   Sherlene Shams, MD  mirabegron ER (MYRBETRIQ) 25 MG TB24 tablet Take 1 tablet (25 mg total) by mouth daily. 02/24/23   Sherlene Shams, MD  Multiple Vitamin (MULTIVITAMIN WITH MINERALS) TABS tablet Take 1 tablet by mouth daily.    [provider]  Multiple Vitamins-Minerals (PRESERVISION AREDS 2) CAPS Take 1 capsule by mouth 2 (two) times daily.     [provider]  ondansetron (ZOFRAN) 4 MG tablet Take 1 tablet (4 mg total) by mouth every 8 (eight) hours as  needed for nausea or vomiting. 02/24/23   Sherlene Shams, MD  pantoprazole (PROTONIX) 40 MG tablet Take 1 tablet (40 mg total) by mouth daily. 02/24/23   Sherlene Shams, MD  senna-docusate (SENOKOT-S) 8.6-50 MG per tablet Take 1 tablet by mouth 2 (two) times daily. 01/19/15   Enid Baas, MD  torsemide (DEMADEX) 10 MG tablet Take 1 tablet daily as needed for shortness of breath or weight gain 3 lbs or greater overnight. 02/24/23   Sherlene Shams, MD  traMADol (ULTRAM) 50 MG tablet Take 1 tablet (50 mg total) by mouth every 6 (six) hours as needed for up to 7 days. 02/24/23 03/03/23  Sherlene Shams, MD  citalopram (CELEXA) 20 MG tablet Take 20 mg by mouth daily.    08/28/11  [provider]  Vitals:   03/01/23 2100 03/01/23 2200 03/01/23 2230 03/01/23 2300  BP: (!) 153/55 (!) 150/57 (!) 147/52 (!) 153/55  Pulse: 75 77 74 77  Resp:  20    Temp:      TempSrc:      SpO2: 100% 100% 100% 100%  Weight:  72 kg    Height:  5\' 1"  (1.549 m)     Physical Exam Vitals and nursing note reviewed.  Constitutional:      General: She is not in acute distress. HENT:     Head: Normocephalic and atraumatic.     Right Ear: Hearing normal.     Left Ear: Hearing normal.     Nose: Nose normal. No nasal deformity.     Mouth/Throat:     Lips: Pink.     Tongue: No lesions.     Pharynx: Oropharynx is clear.  Eyes:     General: Lids are normal.     Extraocular Movements: Extraocular movements intact.  Cardiovascular:     Rate and Rhythm: Normal rate and regular rhythm.     Heart sounds: Normal heart sounds.  Pulmonary:     Effort: Pulmonary effort is normal.     Breath sounds: Rhonchi present.  Abdominal:     General: Bowel sounds are normal. There is no distension.     Palpations: Abdomen is soft. There is no mass.     Tenderness: There is no abdominal tenderness.  Musculoskeletal:     Right lower leg: No edema.     Left lower leg: No edema.  Skin:    General: Skin is warm.   Neurological:     General: No focal deficit present.     Mental Status: She is alert and oriented to person, place, and time.     Cranial Nerves: Cranial nerves 2-12 are intact.  Psychiatric:        Attention and Perception: Attention normal.        Mood and Affect: Mood normal.        Speech: Speech normal.        Behavior: Behavior normal. Behavior is cooperative.     Labs on Admission: I have personally reviewed following labs and imaging studies  CBC: Recent Labs  Lab 03/01/23 2102  WBC 7.3  NEUTROABS 5.6  HGB 10.6*  HCT 32.9*  MCV 97.3  PLT 129*   Basic Metabolic Panel: Recent Labs  Lab 03/01/23 2102  NA 135  K 3.9  CL 100  CO2 27  GLUCOSE 120*  BUN 21  CREATININE 0.77  CALCIUM 8.6*   GFR: Estimated Creatinine Clearance: 38.2 mL/min (by C-G formula based on SCr of 0.77 mg/dL). Liver Function Tests: Recent Labs  Lab 03/01/23 2102  AST 20  ALT 13  ALKPHOS 46  BILITOT 0.7  PROT 6.4*  ALBUMIN 3.5   No results for input(s): "LIPASE", "AMYLASE" in the last 168 hours. No results for input(s): "AMMONIA" in the last 168 hours. Coagulation Profile: No results for input(s): "INR", "PROTIME" in the last 168 hours. Cardiac Enzymes: No results for input(s): "CKTOTAL", "CKMB", "CKMBINDEX", "TROPONINI" in the last 168 hours. BNP (last 3 results) No results for input(s): "PROBNP" in the last 8760 hours. HbA1C: No results for input(s): "HGBA1C" in the last 72 hours. CBG: No results for input(s): "GLUCAP" in the last 168 hours. Lipid Profile: No results for input(s): "CHOL", "HDL", "LDLCALC", "TRIG", "CHOLHDL", "LDLDIRECT" in the last 72 hours. Thyroid Function Tests: No results for input(s): "TSH", "T4TOTAL", "  FREET4", "T3FREE", "THYROIDAB" in the last 72 hours. Anemia Panel: No results for input(s): "VITAMINB12", "FOLATE", "FERRITIN", "TIBC", "IRON", "RETICCTPCT" in the last 72 hours. Urinalysis    Component Value Date/Time   COLORURINE YELLOW (A)  03/01/2023 2251   APPEARANCEUR HAZY (A) 03/01/2023 2251   APPEARANCEUR Clear 03/20/2013 1627   LABSPEC 1.024 03/01/2023 2251   LABSPEC 1.023 03/20/2013 1627   PHURINE 5.0 03/01/2023 2251   GLUCOSEU NEGATIVE 03/01/2023 2251   GLUCOSEU NEGATIVE 07/26/2021 0845   HGBUR NEGATIVE 03/01/2023 2251   BILIRUBINUR NEGATIVE 03/01/2023 2251   BILIRUBINUR Negative 03/20/2013 1627   KETONESUR 20 (A) 03/01/2023 2251   PROTEINUR 30 (A) 03/01/2023 2251   UROBILINOGEN 0.2 07/26/2021 0845   NITRITE NEGATIVE 03/01/2023 2251   LEUKOCYTESUR MODERATE (A) 03/01/2023 2251   LEUKOCYTESUR Trace 03/20/2013 1627      Unresulted Labs (From admission, onward)     Start     Ordered   03/02/23 0500  Comprehensive metabolic panel  Tomorrow morning,   R        03/01/23 2309   03/02/23 0500  CBC  Tomorrow morning,   R        03/01/23 2309   03/01/23 2308  Procalcitonin  Add-on,   AD       References:    Procalcitonin Lower Respiratory Tract Infection AND Sepsis Procalcitonin Algorithm   03/01/23 2309   03/01/23 2308  High sensitivity CRP  Once,   URGENT        03/01/23 2309   03/01/23 2308  Type and screen  Once,   STAT        03/01/23 2309   03/01/23 2302  HIV Antibody (routine testing w rflx)  (HIV Antibody (Routine testing w reflex) panel)  Once,   URGENT        03/01/23 2309   03/01/23 2044  Blood Culture (routine x 2)  (Undifferentiated presentation (screening labs and basic nursing orders))  BLOOD CULTURE X 2,   STAT      03/01/23 2043           Medications  cefTRIAXone (ROCEPHIN) 2 g in sodium chloride 0.9 % 100 mL IVPB (2 g Intravenous New Bag/Given 03/01/23 2310)  azithromycin (ZITHROMAX) 500 mg in sodium chloride 0.9 % 250 mL IVPB (has no administration in time range)  acetaminophen (TYLENOL) tablet 650 mg (has no administration in time range)  aspirin chewable tablet 81 mg (has no administration in time range)  atorvastatin (LIPITOR) tablet 40 mg (has no administration in time range)   carvedilol (COREG) tablet 3.125 mg (has no administration in time range)  levothyroxine (SYNTHROID) tablet 25 mcg (has no administration in time range)  losartan (COZAAR) tablet 100 mg (has no administration in time range)  mirabegron ER (MYRBETRIQ) tablet 25 mg (has no administration in time range)  ondansetron (ZOFRAN) tablet 4 mg (has no administration in time range)  pantoprazole (PROTONIX) injection 40 mg (has no administration in time range)   Radiological Exams on Admission: DG Chest 2 View  Result Date: 03/01/2023 CLINICAL DATA:  Question of sepsis. Cough beginning yesterday with COVID exposure. Oxygenation was 86% on room air. EXAM: CHEST - 2 VIEW COMPARISON:  01/16/2015 FINDINGS: Postoperative changes in the mediastinum. Heart size and pulmonary vascularity are normal for technique. Patchy airspace infiltrates demonstrated in the right upper lung and left lung base. This may represent multifocal pneumonia. No pleural effusions. No pneumothorax. Mediastinal contours appear intact. Calcified and tortuous aorta. Degenerative changes in the spine  and shoulders. IMPRESSION: Patchy airspace disease in the right upper lung and left lower lung, likely multifocal pneumonia. Electronically Signed   By: Burman Nieves M.D.   On: 03/01/2023 21:40    Data Reviewed: Relevant notes from primary care and specialist visits, past discharge summaries as available in EHR, including Care Everywhere. Prior diagnostic testing as pertinent to current admission diagnoses Updated medications and problem lists for reconciliation ED course, including vitals, labs, imaging, treatment and response to treatment Triage notes, nursing and pharmacy notes and ED provider's notes Notable results as noted in HPI  Assessment and Plan: Assessment & Plan Cough 2/2 To pneumonia. PRN antitussive with tessalon pearls.  Aspiration precaution.  Pneumonia due to COVID-19 virus Will d/w pharm about remdesivir  or   paxlovid and steroids.   Sepsis due to pneumonia [facility]) Supportive care with MIVF at low rate.  Antipyretics. Follow cultures and sensitivities.  Elevated blood pressure reading with diagnosis of hypertension Vitals:   03/01/23 2037 03/01/23 2100 03/01/23 2200 03/01/23 2230  BP: (!) 169/58 (!) 153/55 (!) 150/57 (!) 147/52   03/01/23 2300  BP: (!) 153/55  Continue losartan/ coreg.  CAD (coronary artery disease) Stable no chest pain. Cont asa/ coreg/ lipitor. Hypothyroidism Continue levothyroxine.  Iron deficiency anemia No reports of any bleeding per patient.  No AC since several years.  History of DVT of lower extremity Currently no AC. We will start heparin   Thrombocytopenia (HCC) Monitor count.  Gastroesophageal reflux disease without esophagitis IV ppi therapy.   DVT prophylaxis:  Heparin 5000 q 12h.   Consults:  None   Advance Care Planning:  Full Code.   Family Communication:  None   Disposition Plan:  Home   Severity of Illness: The appropriate patient status for this patient is INPATIENT. Inpatient status is judged to be reasonable and necessary in order to provide the required intensity of service to ensure the patient's safety. The patient's presenting symptoms, physical exam findings, and initial radiographic and laboratory data in the context of their chronic comorbidities is felt to place them at high risk for further clinical deterioration. Furthermore, it is not anticipated that the patient will be medically stable for discharge from the hospital within 2 midnights of admission.   * I certify that at the point of admission it is my clinical judgment that the patient will require inpatient hospital care spanning beyond 2 midnights from the point of admission due to high intensity of service, high risk for further deterioration and high frequency of surveillance required.*  Author: Gertha Calkin, MD 03/01/2023 11:36 PM  For on call review  [email or web address].

## 2023-03-01 NOTE — ED Notes (Signed)
Patient dropped O2 to 89% when ambulating on room air to restroom and back to bed.

## 2023-03-01 NOTE — Assessment & Plan Note (Signed)
IV ppi therapy.  

## 2023-03-01 NOTE — Assessment & Plan Note (Signed)
Supportive care with MIVF at low rate.  Antipyretics. Follow cultures and sensitivities.

## 2023-03-01 NOTE — ED Provider Notes (Signed)
[facility] Provider Note    Event Date/Time   First MD Initiated Contact with Patient 03/01/23 2032     (approximate)   History   Chief Complaint: Weakness   HPI  Jenna Marshall is a 89 y.o. female with a history of CAD, GERD, hypertension who is brought to the ED today due to shortness of breath.  She reports feeling fatigued and developing nonproductive cough yesterday.  No fever but continued to feel worse today.  Family notes known recent exposure to COVID-positive person.  EMS report room air oxygen saturation of 86%.  Patient has no history of lung disease or home oxygen use.     Physical Exam   Triage Vital Signs: ED Triage Vitals  Encounter Vitals Group     BP 03/01/23 2037 (!) 169/58     Systolic BP Percentile --      Diastolic BP Percentile --      Pulse Rate 03/01/23 2037 77     Resp 03/01/23 2037 19     Temp 03/01/23 2039 (!) 100.6 F (38.1 C)     Temp Source 03/01/23 2039 Oral     SpO2 03/01/23 2037 99 %     Weight --      Height --      Head Circumference --      Peak Flow --      Pain Score --      Pain Loc --      Pain Education --      Exclude from Growth Chart --     Most recent vital signs: Vitals:   03/01/23 2230 03/01/23 2300  BP: (!) 147/52 (!) 153/55  Pulse: 74 77  Resp:    Temp:    SpO2: 100% 100%    General: Awake, no distress.  CV:  Good peripheral perfusion.  Regular rate and rhythm.   Resp:  Normal effort.  Good air entry with bilateral crackles Abd:  No distention.  Soft nontender Other:  Full range of motion in all extremities, no bony tenderness   ED Results / Procedures / Treatments   Labs (all labs ordered are listed, but only abnormal results are displayed) Labs Reviewed  SARS CORONAVIRUS 2 BY RT PCR - Abnormal; Notable for the following components:      Result Value   SARS Coronavirus 2 by RT PCR POSITIVE (*)    All other components within normal limits  COMPREHENSIVE METABOLIC PANEL  - Abnormal; Notable for the following components:   Glucose, Bld 120 (*)    Calcium 8.6 (*)    Total Protein 6.4 (*)    All other components within normal limits  CBC WITH DIFFERENTIAL/PLATELET - Abnormal; Notable for the following components:   RBC 3.38 (*)    Hemoglobin 10.6 (*)    HCT 32.9 (*)    Platelets 129 (*)    Lymphs Abs 0.6 (*)    All other components within normal limits  URINALYSIS, W/ REFLEX TO CULTURE (INFECTION SUSPECTED) - Abnormal; Notable for the following components:   Color, Urine YELLOW (*)    APPearance HAZY (*)    Ketones, ur 20 (*)    Protein, ur 30 (*)    Leukocytes,Ua MODERATE (*)    Bacteria, UA RARE (*)    All other components within normal limits  TROPONIN I (HIGH SENSITIVITY) - Abnormal; Notable for the following components:   Troponin I (High Sensitivity) 37 (*)    All other components  within normal limits  CULTURE, BLOOD (ROUTINE X 2)  CULTURE, BLOOD (ROUTINE X 2)  LACTIC ACID, PLASMA  HIV ANTIBODY (ROUTINE TESTING W REFLEX)  COMPREHENSIVE METABOLIC PANEL  CBC  PROCALCITONIN  HIGH SENSITIVITY CRP  TYPE AND SCREEN     EKG Interpreted by me Sinus rhythm rate of 75.  Normal axis and intervals.  Poor R wave progression.  Normal ST segments and T waves.   RADIOLOGY Chest x-ray interpreted by me, shows multifocal hazy infiltrates, radiology report reviewed   PROCEDURES:  .Critical Care  Performed by: Sharman Cheek, MD Authorized by: Sharman Cheek, MD   Critical care provider statement:    Critical care time (minutes):  35   Critical care time was exclusive of:  Separately billable procedures and treating other patients   Critical care was necessary to treat or prevent imminent or life-threatening deterioration of the following conditions:  Sepsis and respiratory failure   Critical care was time spent personally by me on the following activities:  Development of treatment plan with patient or surrogate, discussions with  consultants, evaluation of patient's response to treatment, examination of patient, obtaining history from patient or surrogate, ordering and performing treatments and interventions, ordering and review of laboratory studies, ordering and review of radiographic studies, pulse oximetry, re-evaluation of patient's condition and review of old charts   Care discussed with: admitting provider      MEDICATIONS ORDERED IN ED: Medications  cefTRIAXone (ROCEPHIN) 2 g in sodium chloride 0.9 % 100 mL IVPB (2 g Intravenous New Bag/Given 03/01/23 2310)  azithromycin (ZITHROMAX) 500 mg in sodium chloride 0.9 % 250 mL IVPB (has no administration in time range)  acetaminophen (TYLENOL) tablet 650 mg (has no administration in time range)  aspirin chewable tablet 81 mg (has no administration in time range)  atorvastatin (LIPITOR) tablet 40 mg (has no administration in time range)  carvedilol (COREG) tablet 3.125 mg (has no administration in time range)  levothyroxine (SYNTHROID) tablet 25 mcg (has no administration in time range)  losartan (COZAAR) tablet 100 mg (has no administration in time range)  mirabegron ER (MYRBETRIQ) tablet 25 mg (has no administration in time range)  ondansetron (ZOFRAN) tablet 4 mg (has no administration in time range)  pantoprazole (PROTONIX) injection 40 mg (has no administration in time range)     IMPRESSION / MDM / ASSESSMENT AND PLAN / ED COURSE  I reviewed the triage vital signs and the nursing notes.  DDx: COVID, pneumonia, pulmonary edema, pleural effusion, non-STEMI, electrolyte abnormality, AKI, UTI  Patient's presentation is most consistent with acute presentation with potential threat to life or bodily function.  Patient presents with fever, hypoxia, suspected COVID exposure.  Not in distress, requiring supplemental oxygen due to hypoxia.  Chest x-ray reveals multifocal pneumonia.  COVID-positive.  Case discussed with hospitalist for further management.        FINAL CLINICAL IMPRESSION(S) / ED DIAGNOSES   Final diagnoses:  Pneumonia due to COVID-19 virus  Acute respiratory failure with hypoxia (HCC)     Rx / DC Orders   ED Discharge Orders     None        Note:  This document was prepared using Dragon voice recognition software and may include unintentional dictation errors.   Sharman Cheek, MD 03/01/23 2348

## 2023-03-01 NOTE — Assessment & Plan Note (Signed)
Monitor count.

## 2023-03-01 NOTE — Assessment & Plan Note (Signed)
Currently no AC. We will start heparin

## 2023-03-01 NOTE — ED Notes (Signed)
Patient stuck for labs and blood cultures x2. EKG completed. Patient resting quietly in ER stretcher. VSS, call light within reach.

## 2023-03-01 NOTE — ED Triage Notes (Signed)
Patient comes from home via [facility] due to requesting a COVID test. However, patient states  that she has had a cough that began yesterday and believes she has been exposed. EMS states that patient was 86% on RA upon their arrival to patient's home. Room sat during triage is 88%. Patient placed on 2L [place] and oxygen saturation to improve to 94%.  Aox4. Nad noted. Resp even and unlabored. Skin pwd.

## 2023-03-02 ENCOUNTER — Other Ambulatory Visit: Payer: Self-pay

## 2023-03-02 DIAGNOSIS — U071 COVID-19: Secondary | ICD-10-CM

## 2023-03-02 DIAGNOSIS — J1282 Pneumonia due to coronavirus disease 2019: Secondary | ICD-10-CM

## 2023-03-02 LAB — CBC
HCT: 32.2 % — ABNORMAL LOW (ref 36.0–46.0)
Hemoglobin: 10.4 g/dL — ABNORMAL LOW (ref 12.0–15.0)
MCH: 31.9 pg (ref 26.0–34.0)
MCHC: 32.3 g/dL (ref 30.0–36.0)
MCV: 98.8 fL (ref 80.0–100.0)
Platelets: 128 10*3/uL — ABNORMAL LOW (ref 150–400)
RBC: 3.26 MIL/uL — ABNORMAL LOW (ref 3.87–5.11)
RDW: 14.5 % (ref 11.5–15.5)
WBC: 6 10*3/uL (ref 4.0–10.5)
nRBC: 0 % (ref 0.0–0.2)

## 2023-03-02 LAB — COMPREHENSIVE METABOLIC PANEL
ALT: 12 U/L (ref 0–44)
AST: 20 U/L (ref 15–41)
Albumin: 3.2 g/dL — ABNORMAL LOW (ref 3.5–5.0)
Alkaline Phosphatase: 41 U/L (ref 38–126)
Anion gap: 8 (ref 5–15)
BUN: 19 mg/dL (ref 8–23)
CO2: 27 mmol/L (ref 22–32)
Calcium: 7.9 mg/dL — ABNORMAL LOW (ref 8.9–10.3)
Chloride: 101 mmol/L (ref 98–111)
Creatinine, Ser: 0.7 mg/dL (ref 0.44–1.00)
GFR, Estimated: >60 mL/min (ref >60)
Glucose, Bld: 98 mg/dL (ref 70–99)
Potassium: 3.8 mmol/L (ref 3.5–5.1)
Sodium: 136 mmol/L (ref 135–145)
Total Bilirubin: 0.6 mg/dL (ref 0.3–1.2)
Total Protein: 5.9 g/dL — ABNORMAL LOW (ref 6.5–8.1)

## 2023-03-02 LAB — MAGNESIUM: Magnesium: 2.1 mg/dL (ref 1.7–2.4)

## 2023-03-02 LAB — TROPONIN I (HIGH SENSITIVITY)
Troponin I (High Sensitivity): 41 ng/L — ABNORMAL HIGH (ref <18)
Troponin I (High Sensitivity): 42 ng/L — ABNORMAL HIGH (ref <18)

## 2023-03-02 LAB — TYPE AND SCREEN
ABO/RH(D): B NEG
Antibody Screen: NEGATIVE

## 2023-03-02 LAB — PROCALCITONIN: Procalcitonin: <0.1 ng/mL

## 2023-03-02 LAB — HIV ANTIBODY (ROUTINE TESTING W REFLEX): HIV Screen 4th Generation wRfx: NONREACTIVE

## 2023-03-02 LAB — PHOSPHORUS: Phosphorus: 3.6 mg/dL (ref 2.5–4.6)

## 2023-03-02 MED ORDER — GUAIFENESIN ER 600 MG PO TB12
600.0000 mg | ORAL_TABLET | Freq: Two times a day (BID) | ORAL | Status: DC
  Administered 2023-03-02 – 2023-03-12 (×21): 600 mg via ORAL
  Filled 2023-03-02 (×21): qty 1

## 2023-03-02 MED ORDER — HYDROCOD POLI-CHLORPHE POLI ER 10-8 MG/5ML PO SUER: 5.0000 mL | Freq: Two times a day (BID) | ORAL | Status: DC | PRN

## 2023-03-02 MED ORDER — MENTHOL 3 MG MT LOZG: 1.0000 | LOZENGE | OROMUCOSAL | Status: DC | PRN

## 2023-03-02 MED ORDER — PANTOPRAZOLE SODIUM 40 MG PO TBEC
40.0000 mg | DELAYED_RELEASE_TABLET | Freq: Two times a day (BID) | ORAL | Status: DC
  Administered 2023-03-02 – 2023-03-12 (×21): 40 mg via ORAL
  Filled 2023-03-02 (×21): qty 1

## 2023-03-02 MED ORDER — NIRMATRELVIR/RITONAVIR (PAXLOVID)TABLET
3.0000 | ORAL_TABLET | Freq: Two times a day (BID) | ORAL | Status: AC
  Administered 2023-03-02 – 2023-03-06 (×10): 3 via ORAL
  Filled 2023-03-02: qty 30

## 2023-03-02 MED ORDER — BENZONATATE 100 MG PO CAPS
100.0000 mg | ORAL_CAPSULE | Freq: Three times a day (TID) | ORAL | Status: DC | PRN
  Administered 2023-03-04: 100 mg via ORAL
  Filled 2023-03-02: qty 1

## 2023-03-02 MED ORDER — ENOXAPARIN SODIUM 40 MG/0.4ML IJ SOSY
40.0000 mg | PREFILLED_SYRINGE | INTRAMUSCULAR | Status: DC
  Administered 2023-03-02 – 2023-03-12 (×11): 40 mg via SUBCUTANEOUS
  Filled 2023-03-02 (×11): qty 0.4

## 2023-03-02 MED ORDER — NYSTATIN 100000 UNIT/GM EX POWD
Freq: Two times a day (BID) | CUTANEOUS | Status: DC
  Administered 2023-03-02: 1 via TOPICAL
  Filled 2023-03-02 (×2): qty 15

## 2023-03-02 NOTE — ED Notes (Signed)
Labs drawn. Meds given. Patient passed swallow screen. Back to resting quietly with eyes closed, VSS, CCM in use, call light within reach. No other needs identified at this time.

## 2023-03-02 NOTE — ED Notes (Signed)
Patient medicated and brief changed to clean pad at patient request. VSS, CCM in use, call light within reach. No other needs identified at this time.

## 2023-03-02 NOTE — TOC Initial Note (Addendum)
Transition of Care [facility]) - Initial/Assessment Note    Patient Details  Name: Jenna Marshall MRN: [id number] Date of Birth: [birth year]  Transition of Care [facility]) CM/SW Contact:    Liliana Cline, LCSW Phone Number: 03/02/2023, 3:05 PM  Clinical Narrative:                 Patient is on airborne isolation. PT/OT recommend Home Health. Spoke with daughter Jenna Marshall by phone.  Patient is from home alone. Her 3 children check on her daily. She has a private pay aide 4 hours every other day. She is home alone at night. Jenna Marshall states they provide as much assistance as they can but patient does not have around the clock care at home.  Patient had HH in the past, Jenna Marshall unsure of agency used at this time.  PCP is Dr. Darrick Huntsman. Patient has DME at home. Patient is NOT on oxygen at baseline at home.  Jenna Marshall states the family is worried about patient. She states patient has been weaker the last 2-3 weeks and not at her baseline. She states they feel she would benefit from SNF. Patient went to [facility] in the past. Jenna Marshall states they understand it would have to be needed per PT. She would like PT to reassess when able. PT, OT, and MD updated.      Expected Discharge Plan: Skilled Nursing Facility Barriers to Discharge: Continued Medical Work up   Patient Goals and CMS Choice   CMS Medicare.gov Compare Post Acute Care list provided to:: Patient Represenative (must comment) Choice offered to / list presented to : Adult Children [place] ownership interest in [facility].provided to:: [facility] POA / Guardian    Expected Discharge Plan and Services     Post Acute Care Choice: Skilled Nursing Facility Living arrangements for the past 2 months: Single Family Home                                      Prior Living Arrangements/Services Living arrangements for the past 2 months: Single Family Home Lives with:: Self Patient language and need for interpreter reviewed:: Yes Do you feel safe  going back to the place where you live?: Yes      Need for Family Participation in Patient Care: Yes (Comment) Care giver support system in place?: Yes (comment) Current home services: DME, Sitter Criminal Activity/Legal Involvement Pertinent to Current Situation/Hospitalization: No - Comment as needed  Activities of Daily Living      Permission Sought/Granted Permission sought to share information with : Facility Industrial/product designer granted to share information with : Yes, Verbal Permission Granted (by daughter)              Emotional Assessment         Alcohol / Substance Use: Not Applicable Psych Involvement: No (comment)  Admission diagnosis:  Cough [R05.9] Patient Active Problem List   Diagnosis Date Noted   Pneumonia due to COVID-19 virus 03/01/2023   Sepsis due to pneumonia (HCC) 03/01/2023   Thrombocytopenia (HCC) 03/01/2023   Cough 03/01/2023   General weakness 03/07/2020   Unsteady gait when walking 03/07/2020   Balance problem 03/07/2020   Chronic left hip pain 01/17/2020   Visit for preventive health examination 05/09/2016   Nocturia more than twice per night 05/09/2016   Neoplasm of skin of earlobe 11/03/2015   Iron deficiency anemia 03/03/2015   Left-sided thoracic  back pain 03/03/2015   History of hip fracture 01/13/2015   Drug rash 12/29/2014   S/P CABG x 4 10/14/2014   CAD (coronary artery disease)    Elevated blood pressure reading with diagnosis of hypertension 10/11/2014   Hyperlipidemia 10/11/2014   Gastroesophageal reflux disease without esophagitis 10/11/2014   Hypothyroidism 10/11/2014   Medicare annual wellness visit, subsequent 04/01/2014   Urinary incontinence, urge 09/25/2013   Osteopenia of the elderly 05/13/2013   Knee pain, bilateral 04/05/2013   Long-term use of high-risk medication 03/12/2013   History of shingles 09/02/2012   History of DVT of lower extremity 09/02/2012   Obesity (BMI 30-39.9) 09/01/2011    Macular degeneration    PCP:  Sherlene Shams, MD Pharmacy:   [facility] - [place], [place] - [address] [place] [address] Phone: [phone] Fax: [phone]  [id number] - [place], [place] - [address] AT [facility] [address] [place] [address] [place] [address] Phone: [phone] Fax: [phone]     Social Determinants of Health (SDOH) Social History: SDOH Screenings   Food Insecurity: No Food Insecurity (02/23/2023)  Housing: Low Risk  (02/23/2023)  Transportation Needs: No Transportation Needs (02/23/2023)  Utilities: Not At Risk (01/01/2023)  Alcohol Screen: Low Risk  (01/01/2023)  Depression (PHQ2-9): Low Risk  (02/24/2023)  Financial Resource Strain: Medium Risk (02/23/2023)  Physical Activity: Unknown (02/23/2023)  Recent Concern: Physical Activity - Inactive (01/01/2023)  Social Connections: Moderately Isolated (02/23/2023)  Stress: Stress Concern Present (02/23/2023)  Tobacco Use: Low Risk  (03/01/2023)  Health Literacy: Inadequate Health Literacy (01/01/2023)   SDOH Interventions:     Readmission Risk Interventions    03/02/2023    3:03 PM  Readmission Risk Prevention Plan  Transportation Screening Complete  PCP or Specialist Appt within 5-7 Days Complete  Home Care Screening Complete  Medication Review (RN CM) Complete

## 2023-03-02 NOTE — Evaluation (Signed)
Physical Therapy Evaluation Patient Details Name: Jenna Marshall MRN: [id number] DOB: August 06, 1925 Today's Date: 03/02/2023  History of Present Illness  Pt is a 89 y.o. female presenting to hospital 03/01/23 with c/o weakness and SOB; recent known exposure to COVID-positive person.  Pt admitted with cough, PNA d/t COVID-19 virus, sepsis.  PMH includes CAD, htn, DVT, anemia, macular degeneration, L hip hemiarthroplasty.  Clinical Impression  Prior to recent medical concerns, pt was modified independent ambulating with rollator; lives alone but reports she has daily assist from family and/or a caregiver.  Currently pt is min to mod assist with bed mobility; CGA to stand from ED stretcher bed but mod assist to stand from toilet; and CGA to ambulate short distances in room with RW use.  Generalized weakness noted but pt reports her legs are feeling a little stronger today and has a little bit of an appetite today.  Pt would currently benefit from skilled PT to address noted impairments and functional limitations (see below for any additional details).  Upon hospital discharge, pt would benefit from ongoing therapy.     If plan is discharge home, recommend the following: A little help with walking and/or transfers;A little help with bathing/dressing/bathroom;Assistance with cooking/housework;Assist for transportation;Help with stairs or ramp for entrance   Can travel by private vehicle        Equipment Recommendations None recommended by PT (Pt has walker at home already)  Recommendations for Other Services       Functional Status Assessment Patient has had a recent decline in their functional status and demonstrates the ability to make significant improvements in function in a reasonable and predictable amount of time.     Precautions / Restrictions Precautions Precautions: Fall Restrictions Weight Bearing Restrictions: No      Mobility  Bed Mobility Overal bed mobility: Needs  Assistance Bed Mobility: Supine to Sit, Sit to Supine     Supine to sit: Mod assist, HOB elevated (assist for trunk) Sit to supine: Min assist (assist for B LE's)   General bed mobility comments: vc's for technique    Transfers Overall transfer level: Needs assistance Equipment used: Rolling walker (2 wheels) Transfers: Sit to/from Stand Sit to Stand: Contact guard assist, Mod assist           General transfer comment: CGA to stand from ED stretcher bed and mod assist to stand from toilet (use of grab bar)    Ambulation/Gait Ambulation/Gait assistance: Contact guard assist Gait Distance (Feet):  (10 feet x2 (to/from toilet)) Assistive device: Rolling walker (2 wheels) Gait Pattern/deviations: Step-through pattern, Decreased step length - right, Decreased step length - left Gait velocity: decreased     General Gait Details: steady ambulation with RW use  Stairs            Wheelchair Mobility     Tilt Bed    Modified Rankin (Stroke Patients Only)       Balance Overall balance assessment: Needs assistance Sitting-balance support: No upper extremity supported, Feet supported Sitting balance-Leahy Scale: Good Sitting balance - Comments: steady reaching within BOS   Standing balance support: Reliant on assistive device for balance, Single extremity supported Standing balance-Leahy Scale: Fair Standing balance comment: steady adjusting underwear in standing                             Pertinent Vitals/Pain Pain Assessment Pain Assessment: Faces Faces Pain Scale: Hurts little more Pain Location: LBP Pain Descriptors /  Indicators: Aching, Sore (pt reports from laying in bed) Pain Intervention(s): Limited activity within patient's tolerance, Monitored during session, Repositioned, Other (comment) (RN notified) HR 79-88 bpm and SpO2 sats 92% or greater on 1.5 L O2 via nasal cannula.    Home Living Family/patient expects to be discharged to::  Private residence Living Arrangements: Alone Available Help at Discharge: Personal care attendant;Family (Comes every other day (4.5 hours each time); children come every day) Type of Home: House Home Access: Stairs to enter Entrance Stairs-Rails: Right;Left;Can reach both Entrance Stairs-Number of Steps: 1   Home Layout: One level Home Equipment: Shower seat - built in;Shower seat;Grab bars - tub/shower;Rollator (4 wheels);Rolling Walker (2 wheels);BSC/3in1;Wheelchair - manual      Prior Function Prior Level of Function : Independent/Modified Independent             Mobility Comments: Modified independent ambulating with rollator (limited ambulation d/t arthritis); 1 fall yesterday at home (no reported pain/injury) ADLs Comments: Caregiver assists with showers.  Assist with meals and cleaning.  Daughter is a Engineer, civil (consulting) and helps with medication.  Cleaning lady 1x/week.     Extremity/Trunk Assessment   Upper Extremity Assessment Upper Extremity Assessment: Defer to OT evaluation;Generalized weakness    Lower Extremity Assessment Lower Extremity Assessment: Generalized weakness    Cervical / Trunk Assessment Cervical / Trunk Assessment: Other exceptions Cervical / Trunk Exceptions: forward head/shoulders  Communication   Communication Communication: Hearing impairment (HOH (has R hearing aide)) Cueing Techniques: Verbal cues;Visual cues  Cognition Arousal: Alert Behavior During Therapy: WFL for tasks assessed/performed Overall Cognitive Status: Within Functional Limits for tasks assessed                                          General Comments  Nursing cleared pt for participation in physical therapy.  Pt agreeable to PT session.    Exercises     Assessment/Plan    PT Assessment Patient needs continued PT services  PT Problem List Decreased strength;Decreased activity tolerance;Decreased balance;Decreased mobility;Pain       PT Treatment  Interventions DME instruction;Gait training;Stair training;Functional mobility training;Therapeutic activities;Therapeutic exercise;Balance training;Patient/family education    PT Goals (Current goals can be found in the Care Plan section)  Acute Rehab PT Goals Patient Stated Goal: to improve strength and mobility PT Goal Formulation: With patient Time For Goal Achievement: 03/16/23 Potential to Achieve Goals: Good    Frequency Min 1X/week     Co-evaluation               AM-PAC PT "6 Clicks" Mobility  Outcome Measure Help needed turning from your back to your side while in a flat bed without using bedrails?: A Little Help needed moving from lying on your back to sitting on the side of a flat bed without using bedrails?: A Lot Help needed moving to and from a bed to a chair (including a wheelchair)?: A Little Help needed standing up from a chair using your arms (e.g., wheelchair or bedside chair)?: A Little Help needed to walk in hospital room?: A Little Help needed climbing 3-5 steps with a railing? : A Lot 6 Click Score: 16    End of Session Equipment Utilized During Treatment: Oxygen (1.5 L via nasal cannula) Activity Tolerance: Patient tolerated treatment well Patient left: in bed;with call bell/phone within reach;Other (comment) (ED stretcher bed in lowest position; B railings up) Nurse Communication: Mobility  status;Precautions;Other (comment) (Pt's pain status) PT Visit Diagnosis: Other abnormalities of gait and mobility (R26.89);Muscle weakness (generalized) (M62.81);History of falling (Z91.81);Pain Pain - part of body:  (LBP)    Time: 1610-9604 PT Time Calculation (min) (ACUTE ONLY): 32 min   Charges:   PT Evaluation $PT Eval Low Complexity: 1 Low PT Treatments $Therapeutic Activity: 8-22 mins PT General Charges $$ ACUTE PT VISIT: 1 Visit        Hendricks Limes, PT 03/02/23, 10:44 AM

## 2023-03-02 NOTE — Progress Notes (Signed)
PHARMACIST - PHYSICIAN COMMUNICATION  CONCERNING: IV to Oral Route Change Policy  RECOMMENDATION: This patient is receiving pantoprazole by the intravenous route.  Based on criteria approved by the Pharmacy and Therapeutics Committee, the intravenous medication(s) is/are being converted to the equivalent oral dose form(s).  DESCRIPTION: These criteria include: The patient is eating (either orally or via tube) and/or has been taking other orally administered medications for a least 24 hours The patient has no evidence of active gastrointestinal bleeding or impaired GI absorption (gastrectomy, short bowel, patient on TNA or NPO).  If you have questions about this conversion, please contact the Pharmacy Department   Tressie Ellis, [facility] 03/02/2023 8:29 AM

## 2023-03-02 NOTE — Progress Notes (Signed)
[facility] Progress Note  Patient: Jenna Marshall    ZOX:[id number]  DOA: 03/01/2023     Date of Service: the patient was seen and examined on 03/02/2023  Chief Complaint  Patient presents with   Weakness   Brief hospital course: Vesta Wheeland is a 89 y.o. female with medical history significant for hypothyroidism/ h/o DVT , anemia h/o CAD coming for cough, not feeling well since Thursday. Pt denies any chest pain palpitation or bleeding.  Lives at home and has round the clock care. States she feel today but otherwise is stable and walks with limited difficulty due to her arthritis.  Pt is alert oriented in ED, NAD. Pt meets sepsis criteria on presentation.  Labs shows: glucose of 120 and normal electrolytes/ renal function and LFT. Mild TNI at 37 from Covid and sepsis. Lactic 1.1 and cbc shows anemia of 10.6 and thrombocytopenia.  In ed pt was started on CAP regimen.    Assessment and Plan:  Sepsis secondary to COVID viral pneumonia S/p ceftriaxone and azithromycin, discontinued antibiotics, less likely bacterial infection.  WBC count within normal range, procalcitonin negative. Continue Paxlovid for 5 days Mucinex extremity gram p.o. twice daily Continue supplemental O2 in addition and gradually wean off  Acute hypoxic respiratory failure due to COVID viral pneumonia Continue supplemental O2 admission and gradually wean off   CAD, HTN Continued aspirin, statin, Coreg, losartan Monitor BP and titrate medications accordingly  History of DVT, currently not on DOAC Continue DVT prophylaxis  Iron deficiency anemia Continue monitor CBC daily  Thrombocytopenia, mild, continue to monitor CBC daily  Hypothyroid, continue Synthroid   GERD, continue PPI  Body mass index is 29.99 kg/m.  Interventions:  Diet: Regular diet DVT Prophylaxis: Subcutaneous Lovenox   Advance goals of care discussion: Full code  Family Communication: family was present at bedside, at the  time of interview.  The pt provided permission to discuss medical plan with the family. Opportunity was given to ask question and all questions were answered satisfactorily.   Disposition:  Pt is from Home, admitted with COVID viral pneumonia, respiratory failure, still on treatment, which precludes a safe discharge. Discharge to home with home health versus SNF, TBD after PT and OT eval, when stable..  Subjective: No significant events overnight, patient was admitted with respiratory failure due to COVID-pneumonia.  Breathing is getting better, denied any chest pain or palpitation, patient has generalized weakness in the bilateral lower extremities with ambulatory dysfunction. We will continue above treatment, patient will need PT and OT eval before disposition plan.   Physical Exam: General: NAD, lying comfortably Appear in no distress, affect appropriate Eyes: PERRLA ENT: Oral Mucosa Clear, moist  Neck: no JVD,  Cardiovascular: S1 and S2 Present, Audible murmur Respiratory: Equal air entry bilaterally, mild crackles bilaterally, no wheezing Abdomen: Bowel Sound present, Soft and no tenderness,  Skin: no rashes Extremities: no Pedal edema, no calf tenderness Neurologic: without any new focal findings Gait not checked due to patient safety concerns  Vitals:   03/02/23 0300 03/02/23 0345 03/02/23 0500 03/02/23 0924  BP: (!) 145/53  (!) 152/58 (!) 152/62  Pulse: 72 72 72 77  Resp: (!) 27 (!) 22 (!) 21   Temp:  99.4 F (37.4 C)    TempSrc:  Axillary    SpO2: 90% 98% 99%   Weight:      Height:        Intake/Output Summary (Last 24 hours) at 03/02/2023 1236 Last data filed at 03/02/2023  0107 Gross per 24 hour  Intake 348.14 ml  Output --  Net 348.14 ml   Filed Weights   03/01/23 2200  Weight: 72 kg    Data Reviewed: I have personally reviewed and interpreted daily labs, tele strips, imagings as discussed above. I reviewed all nursing notes, pharmacy notes, vitals,  pertinent old records I have discussed plan of care as described above with RN and patient/family.  CBC: Recent Labs  Lab 03/01/23 2102 03/02/23 0342  WBC 7.3 6.0  NEUTROABS 5.6  --   HGB 10.6* 10.4*  HCT 32.9* 32.2*  MCV 97.3 98.8  PLT 129* 128*   Basic Metabolic Panel: Recent Labs  Lab 03/01/23 2102 03/02/23 0146 03/02/23 0342  NA 135 136  --   K 3.9 3.8  --   CL 100 101  --   CO2 27 27  --   GLUCOSE 120* 98  --   BUN 21 19  --   CREATININE 0.77 0.70  --   CALCIUM 8.6* 7.9*  --   MG  --   --  2.1  PHOS  --   --  3.6    Studies: DG Chest 2 View  Result Date: 03/01/2023 CLINICAL DATA:  Question of sepsis. Cough beginning yesterday with COVID exposure. Oxygenation was 86% on room air. EXAM: CHEST - 2 VIEW COMPARISON:  01/16/2015 FINDINGS: Postoperative changes in the mediastinum. Heart size and pulmonary vascularity are normal for technique. Patchy airspace infiltrates demonstrated in the right upper lung and left lung base. This may represent multifocal pneumonia. No pleural effusions. No pneumothorax. Mediastinal contours appear intact. Calcified and tortuous aorta. Degenerative changes in the spine and shoulders. IMPRESSION: Patchy airspace disease in the right upper lung and left lower lung, likely multifocal pneumonia. Electronically Signed   By: Burman Nieves M.D.   On: 03/01/2023 21:40    Scheduled Meds:  aspirin  81 mg Oral Daily   atorvastatin  40 mg Oral Daily   carvedilol  3.125 mg Oral BID WC   enoxaparin (LOVENOX) injection  40 mg Subcutaneous Q24H   guaiFENesin  600 mg Oral BID   levothyroxine  25 mcg Oral Q0600   losartan  100 mg Oral Daily   mirabegron ER  25 mg Oral Daily   nirmatrelvir/ritonavir  3 tablet Oral BID   pantoprazole  40 mg Oral BID   Continuous Infusions: PRN Meds: acetaminophen, benzonatate, chlorpheniramine-HYDROcodone, menthol-cetylpyridinium, ondansetron  Time spent: 55 minutes  Author: Gillis Santa. MD [facility] 03/02/2023 12:36 PM  To reach On-call, see care teams to locate the attending and reach out to them via [email or web address]. If 7PM-7AM, please contact night-coverage If you still have difficulty reaching the attending provider, please page the [facility] (Director on Call) for [facility] on amion for assistance.

## 2023-03-02 NOTE — Evaluation (Signed)
Occupational Therapy Evaluation Patient Details Name: Jenna Marshall MRN: [id number] DOB: 1925/07/24 Today's Date: 03/02/2023   History of Present Illness Pt is a 89 y.o. female presenting to hospital 03/01/23 with c/o weakness and SOB; recent known exposure to COVID-positive person.  Pt admitted with cough, PNA d/t COVID-19 virus, sepsis.  PMH includes CAD, htn, DVT, anemia, macular degeneration, L hip hemiarthroplasty.   Clinical Impression   Jenna Marshall was seen for OT evaluation this date. Prior to hospital admission, pt was MOD I using RW for mobility. Pt lives alone with caregiver and family available daily. Pt currently requires MIN A + RW for toilet t/f - assist for low toilet height. SBA + RW standing grooming tasks. SpO2 mid 90s t/o mobility on RA, 88% on return to bed, improves to 98% with HOB elevated. Pt would benefit from skilled OT to address noted impairments and functional limitations (see below for any additional details). Upon hospital discharge, recommend OT follow up.    If plan is discharge home, recommend the following: A little help with walking and/or transfers;A little help with bathing/dressing/bathroom;Help with stairs or ramp for entrance    Functional Status Assessment  Patient has had a recent decline in their functional status and demonstrates the ability to make significant improvements in function in a reasonable and predictable amount of time.  Equipment Recommendations  BSC/3in1    Recommendations for Other Services       Precautions / Restrictions Precautions Precautions: Fall Restrictions Weight Bearing Restrictions: No      Mobility Bed Mobility Overal bed mobility: Needs Assistance Bed Mobility: Supine to Sit, Sit to Supine     Supine to sit: Contact guard, HOB elevated Sit to supine: Min assist        Transfers Overall transfer level: Needs assistance Equipment used: Rolling walker (2 wheels) Transfers: Sit to/from Stand Sit to Stand:  Min assist           General transfer comment: CGA to stand from ED stretcher bed and MIN assist to stand from toilet (use of grab bar)      Balance Overall balance assessment: Needs assistance Sitting-balance support: No upper extremity supported, Feet supported Sitting balance-Leahy Scale: Good     Standing balance support: No upper extremity supported, During functional activity Standing balance-Leahy Scale: Fair                             ADL either performed or assessed with clinical judgement   ADL Overall ADL's : Needs assistance/impaired                                       General ADL Comments: MIN A + RW for toilet t/f - assist for low toilet height. SBA + RW standing grooming tasks.       Pertinent Vitals/Pain Pain Assessment Pain Assessment: No/denies pain     Extremity/Trunk Assessment Upper Extremity Assessment Upper Extremity Assessment: Generalized weakness   Lower Extremity Assessment Lower Extremity Assessment: Generalized weakness   Cervical / Trunk Assessment Cervical / Trunk Assessment: Other exceptions Cervical / Trunk Exceptions: forward head/shoulders   Communication Communication Communication: Hearing impairment Cueing Techniques: Verbal cues;Visual cues   Cognition Arousal: Alert Behavior During Therapy: WFL for tasks assessed/performed Overall Cognitive Status: Within Functional Limits for tasks assessed  General Comments  SpO2 mid 90s t/o mobility on RA, 88% on return to bed, improves to 98% with [facility] elevated            Home Living Family/patient expects to be discharged to:: Private residence Living Arrangements: Alone Available Help at Discharge: Personal care attendant;Family (Comes every other day (4.5 hours each time); children come every day) Type of Home: House Home Access: Stairs to enter [employer] of Steps: 1 Entrance  Stairs-Rails: Right;Left;Can reach both Home Layout: One level     Bathroom Shower/Tub: Runner, broadcasting/film/video: Shower seat - built in;Shower seat;Grab bars - tub/shower;Rollator (4 wheels);Rolling Walker (2 wheels);BSC/3in1;Wheelchair - manual;Toilet riser          Prior Functioning/Environment Prior Level of Function : Independent/Modified Independent             Mobility Comments: Modified independent ambulating with rollator (limited ambulation d/t arthritis); 1 fall yesterday at home (no reported pain/injury) ADLs Comments: Caregiver assists with showers.  Assist with meals and cleaning.  Daughter is a Engineer, civil (consulting) and helps with medication.  Cleaning lady 1x/week.        OT Problem List: Decreased strength;Decreased activity tolerance;Impaired balance (sitting and/or standing)      OT Treatment/Interventions: Self-care/ADL training;Therapeutic exercise;Energy conservation;DME and/or AE instruction;Therapeutic activities    OT Goals(Current goals can be found in the care plan section) Acute Rehab OT Goals Patient Stated Goal: to go home OT Goal Formulation: With patient/family Time For Goal Achievement: 03/16/23 Potential to Achieve Goals: Good ADL Goals Pt Will Perform Grooming: with modified independence;standing Pt Will Perform Lower Body Dressing: with modified independence;sitting/lateral leans;sit to/from stand Pt Will Transfer to Toilet: with modified independence;ambulating;regular height toilet Pt Will Perform Toileting - Clothing Manipulation and hygiene: with modified independence;sitting/lateral leans  OT Frequency: Min 1X/week    Co-evaluation              AM-PAC OT "6 Clicks" Daily Activity     Outcome Measure Help from another person eating meals?: None Help from another person taking care of personal grooming?: A Little Help from another person toileting, which includes using toliet, bedpan, or urinal?: A Little Help from another  person bathing (including washing, rinsing, drying)?: A Lot Help from another person to put on and taking off regular upper body clothing?: A Little Help from another person to put on and taking off regular lower body clothing?: A Lot 6 Click Score: 17   End of Session Equipment Utilized During Treatment: Rolling walker (2 wheels)  Activity Tolerance: Patient tolerated treatment well Patient left: in bed;with call bell/phone within reach;with family/visitor present  OT Visit Diagnosis: Other abnormalities of gait and mobility (R26.89);Muscle weakness (generalized) (M62.81)                Time: 9562-1308 OT Time Calculation (min): 25 min Charges:  OT General Charges $OT Visit: 1 Visit OT Evaluation $OT Eval Moderate Complexity: 1 Mod  Kathie Dike, M.S. OTR/L  03/02/23, 12:32 PM  ascom [phone]

## 2023-03-03 ENCOUNTER — Inpatient Hospital Stay (HOSPITAL_COMMUNITY)
Admit: 2023-03-03 | Discharge: 2023-03-03 | Disposition: A | Discharge status: Home / Self Care | Payer: Medicare HMO | Attending: Student | Admitting: Student

## 2023-03-03 DIAGNOSIS — R011 Cardiac murmur, unspecified: Secondary | ICD-10-CM

## 2023-03-03 DIAGNOSIS — J1282 Pneumonia due to coronavirus disease 2019: Secondary | ICD-10-CM | POA: Diagnosis not present

## 2023-03-03 DIAGNOSIS — U071 COVID-19: Secondary | ICD-10-CM | POA: Diagnosis not present

## 2023-03-03 LAB — CBC
HCT: 32 % — ABNORMAL LOW (ref 36.0–46.0)
Hemoglobin: 10.5 g/dL — ABNORMAL LOW (ref 12.0–15.0)
MCH: 31.5 pg (ref 26.0–34.0)
MCHC: 32.8 g/dL (ref 30.0–36.0)
MCV: 96.1 fL (ref 80.0–100.0)
Platelets: 121 10*3/uL — ABNORMAL LOW (ref 150–400)
RBC: 3.33 MIL/uL — ABNORMAL LOW (ref 3.87–5.11)
RDW: 14.1 % (ref 11.5–15.5)
WBC: 5.4 10*3/uL (ref 4.0–10.5)
nRBC: 0 % (ref 0.0–0.2)

## 2023-03-03 LAB — ECHOCARDIOGRAM COMPLETE
AR max vel: 1.23 cm2
AV Area VTI: 1.06 cm2
AV Area mean vel: 1.1 cm2
AV Mean grad: 6 mm[Hg]
AV Peak grad: 10.3 mm[Hg]
Ao pk vel: 1.61 m/s
Area-P 1/2: 5.23 cm2
Height: 61 in
MV VTI: 1.45 cm2
S' Lateral: 2.8 cm
Weight: 2539.7 [oz_av]

## 2023-03-03 LAB — HEPATIC FUNCTION PANEL
ALT: 14 U/L (ref 0–44)
AST: 22 U/L (ref 15–41)
Albumin: 3 g/dL — ABNORMAL LOW (ref 3.5–5.0)
Alkaline Phosphatase: 42 U/L (ref 38–126)
Bilirubin, Direct: 0.1 mg/dL (ref 0.0–0.2)
Indirect Bilirubin: 0.6 mg/dL (ref 0.3–0.9)
Total Bilirubin: 0.7 mg/dL (ref 0.3–1.2)
Total Protein: 6 g/dL — ABNORMAL LOW (ref 6.5–8.1)

## 2023-03-03 LAB — BASIC METABOLIC PANEL
Anion gap: 7 (ref 5–15)
BUN: 20 mg/dL (ref 8–23)
CO2: 27 mmol/L (ref 22–32)
Calcium: 8.1 mg/dL — ABNORMAL LOW (ref 8.9–10.3)
Chloride: 101 mmol/L (ref 98–111)
Creatinine, Ser: 0.69 mg/dL (ref 0.44–1.00)
GFR, Estimated: >60 mL/min (ref >60)
Glucose, Bld: 79 mg/dL (ref 70–99)
Potassium: 3.8 mmol/L (ref 3.5–5.1)
Sodium: 135 mmol/L (ref 135–145)

## 2023-03-03 LAB — MAGNESIUM: Magnesium: 2 mg/dL (ref 1.7–2.4)

## 2023-03-03 LAB — PHOSPHORUS: Phosphorus: 3.4 mg/dL (ref 2.5–4.6)

## 2023-03-03 LAB — HIGH SENSITIVITY CRP: CRP, High Sensitivity: 53.19 mg/L — ABNORMAL HIGH (ref 0.00–3.00)

## 2023-03-03 MED ORDER — ENSURE ENLIVE PO LIQD
237.0000 mL | Freq: Two times a day (BID) | ORAL | Status: DC
  Administered 2023-03-03 – 2023-03-12 (×14): 237 mL via ORAL

## 2023-03-03 MED ORDER — ADULT MULTIVITAMIN W/MINERALS CH
1.0000 | ORAL_TABLET | Freq: Every day | ORAL | Status: DC
  Administered 2023-03-03 – 2023-03-12 (×10): 1 via ORAL
  Filled 2023-03-03 (×11): qty 1

## 2023-03-03 MED ORDER — HYDRALAZINE HCL 20 MG/ML IJ SOLN
10.0000 mg | Freq: Four times a day (QID) | INTRAMUSCULAR | Status: DC | PRN
  Administered 2023-03-03 – 2023-03-04 (×3): 10 mg via INTRAVENOUS
  Filled 2023-03-03 (×3): qty 1

## 2023-03-03 MED ORDER — HYDRALAZINE HCL 50 MG PO TABS
50.0000 mg | ORAL_TABLET | Freq: Four times a day (QID) | ORAL | Status: DC | PRN
  Administered 2023-03-05 – 2023-03-11 (×4): 50 mg via ORAL
  Filled 2023-03-03 (×4): qty 1

## 2023-03-03 NOTE — Progress Notes (Signed)
Occupational Therapy Treatment Patient Details Name: Jenna Marshall MRN: [id number] DOB: [birth year] Today's Date: 03/03/2023   History of present illness Pt is a 89 y.o. female presenting to hospital 03/01/23 with c/o weakness and SOB; recent known exposure to COVID-positive person.  Pt admitted with cough, PNA d/t COVID-19 virus, sepsis.  PMH includes CAD, htn, DVT, anemia, macular degeneration, L hip hemiarthroplasty.   OT comments  Pt seen for OT treatment on this date. Upon arrival to room Pt supine in bed, agreeable to tx. Pt completed bed mobility from supine to sitting at the EOB with supervision. Pt required MIN A + RW for BSC t/f. CGA + RW standing grooming tasks and ambulating ~48ft within room. SpO2 90% on RA during session. Left seated in chair with call bell and phone within reach. Pt making good progress toward goals, will continue to follow POC. Discharge recommendation updated to reflect assist required at home.      If plan is discharge home, recommend the following:  A little help with walking and/or transfers;A little help with bathing/dressing/bathroom;Help with stairs or ramp for entrance   Equipment Recommendations  BSC/3in1    Recommendations for Other Services      Precautions / Restrictions Precautions Precautions: Fall Restrictions Weight Bearing Restrictions: No       Mobility Bed Mobility Overal bed mobility: Needs Assistance Bed Mobility: Supine to Sit, Sit to Supine     Supine to sit: Supervision, HOB elevated, Used rails          Transfers Overall transfer level: Needs assistance Equipment used: Rolling walker (2 wheels) Transfers: Sit to/from Stand Sit to Stand: Min assist, Contact guard assist           General transfer comment: MIN A from bed, CGA from toilet and chair height - requires B foot block to prevent sliding feet     Balance Overall balance assessment: Needs assistance Sitting-balance support: Feet supported, Single  extremity supported Sitting balance-Leahy Scale: Good     Standing balance support: No upper extremity supported, During functional activity, Reliant on assistive device for balance Standing balance-Leahy Scale: Fair                             ADL either performed or assessed with clinical judgement   ADL Overall ADL's : Needs assistance/impaired                                       General ADL Comments: MIN A + RW for toilet t/f - assist for low toilet height. CGA + RW standing grooming tasks.    Extremity/Trunk Assessment              Vision       Perception     Praxis      Cognition Arousal: Alert Behavior During Therapy: WFL for tasks assessed/performed Overall Cognitive Status: Within Functional Limits for tasks assessed                                 General Comments: HOH        Exercises      Shoulder Instructions            Pertinent Vitals/ Pain       Pain Assessment Pain Assessment: No/denies pain  Home Living                                          Prior Functioning/Environment              Frequency  Min 1X/week        Progress Toward Goals  OT Goals(current goals can now be found in the care plan section)  Progress towards OT goals: Progressing toward goals  Acute Rehab OT Goals Patient Stated Goal: to go home OT Goal Formulation: With patient/family Time For Goal Achievement: 03/16/23 Potential to Achieve Goals: Good ADL Goals Pt Will Perform Grooming: with modified independence;standing Pt Will Perform Lower Body Dressing: with modified independence;sitting/lateral leans;sit to/from stand Pt Will Transfer to Toilet: with modified independence;ambulating;regular height toilet Pt Will Perform Toileting - Clothing Manipulation and hygiene: with modified independence;sitting/lateral leans  Plan      Co-evaluation                 AM-PAC OT "6  Clicks" Daily Activity     Outcome Measure   Help from another person eating meals?: None Help from another person taking care of personal grooming?: A Little Help from another person toileting, which includes using toliet, bedpan, or urinal?: A Little Help from another person bathing (including washing, rinsing, drying)?: A Lot Help from another person to put on and taking off regular upper body clothing?: A Little Help from another person to put on and taking off regular lower body clothing?: A Lot 6 Click Score: 17    End of Session Equipment Utilized During Treatment: Rolling walker (2 wheels)  OT Visit Diagnosis: Other abnormalities of gait and mobility (R26.89);Muscle weakness (generalized) (M62.81)   Activity Tolerance Patient tolerated treatment well   Patient Left in bed;with call bell/phone within reach;with bed alarm set   Nurse Communication          Time: 1308-6578 OT Time Calculation (min): 31 min  Charges: OT General Charges $OT Visit: 1 Visit OT Treatments $Self Care/Home Management : 23-37 mins  Butch Penny, SOT

## 2023-03-03 NOTE — Progress Notes (Signed)
Initial Nutrition Assessment  DOCUMENTATION CODES:   Obesity unspecified  INTERVENTION:   -Ensure Enlive po BID, each supplement provides 350 kcal and 20 grams of protein.  -MVI with minerals daily -Continue with regular diet  NUTRITION DIAGNOSIS:   Increased nutrient needs related to acute illness as evidenced by estimated needs.  GOAL:   Patient will meet greater than or equal to 90% of their needs  MONITOR:   PO intake, Supplement acceptance  REASON FOR ASSESSMENT:   Consult Assessment of nutrition requirement/status  ASSESSMENT:   Pt with medical history significant for hypothyroidism/ h/o DVT , anemia h/o CAD coming for cough, not feeling well since 02/27/23.  Pt admitted with sepsis secondary to COVID pneumonia.   Reviewed I/O's: +80 ml x 24 hours and +428 ml since admission  UOP: 400 ml x 24 hours   Pt working with physical therapy at time of visit. Pt unable to provide further history a this time.   Pt is currently on a regular diet. No meal completion data available to assess a this time.   Reviewed wt hx; no wt loss noted over the past 4 months.   Pt with increased nutritional needs due to acute illness and would benefit from addition of oral nutrition supplements.   Per TOC notes, considering SNF placement if this is recommended. Pt has family and caregivers that check on her, but does not have round the clock care.   Medications reviewed and include protonix.   Lab Results  Component Value Date   HGBA1C 6.0 01/25/2022   PTA DM medications are none.   Labs reviewed: CBGS: 136 (inpatient orders for glycemic control are none).    NUTRITION - FOCUSED PHYSICAL EXAM:  Flowsheet Row Most Recent Value  Orbital Region No depletion  Upper Arm Region No depletion  Thoracic and Lumbar Region No depletion  Buccal Region No depletion  Temple Region No depletion  Clavicle Bone Region No depletion  Clavicle and Acromion Bone Region No depletion  Scapular  Bone Region No depletion  Dorsal Hand No depletion  Patellar Region No depletion  Anterior Thigh Region No depletion  Posterior Calf Region No depletion  Edema (RD Assessment) None  Hair Reviewed  Eyes Reviewed  Mouth Reviewed  Skin Reviewed  Nails Reviewed       Diet Order:   Diet Order             Diet regular Fluid consistency: Thin  Diet effective now                   EDUCATION NEEDS:   No education needs have been identified at this time  Skin:  Skin Assessment: Reviewed RN Assessment  Last BM:  03/02/23  Height:   Ht Readings from Last 1 Encounters:  03/01/23 5\' 1"  (1.549 m)    Weight:   Wt Readings from Last 1 Encounters:  03/01/23 72 kg    Ideal Body Weight:  47.7 kg  BMI:  Body mass index is 29.99 kg/m.  Estimated Nutritional Needs:   Kcal:  1500-1700  Protein:  75-90 grams  Fluid:  > 1.5 L    Levada Schilling, RD, LDN, CDCES Registered Dietitian III Certified Diabetes Care and Education Specialist Please refer to [facility] for RD and/or RD on-call/weekend/after hours pager

## 2023-03-03 NOTE — Progress Notes (Signed)
*  PRELIMINARY RESULTS* Echocardiogram 2D Echocardiogram has been performed.  Cristela Blue 03/03/2023, 1:43 PM

## 2023-03-03 NOTE — Progress Notes (Signed)
Physical Therapy Treatment Patient Details Name: Jenna Marshall MRN: [id number] DOB: 1926/05/31 Today's Date: 03/03/2023   History of Present Illness Pt is a 89 y.o. female presenting to hospital 03/01/23 with c/o weakness and SOB; recent known exposure to COVID-positive person.  Pt admitted with cough, PNA d/t COVID-19 virus, sepsis.  PMH includes CAD, htn, DVT, anemia, macular degeneration, L hip hemiarthroplasty.    PT Comments  Pt received in bed with daughter at bedside, she is agreeable to PT session. At beginning of session, trialed RA tolerance with SpO2 95% but desaturated to 86% SpO2 following bed mobility, therefore reapplied 1L [place]. Pt performs bed mobility mod A, transfers mod-max A, and amb CGA. Pt performed x4 STS throughout session requiring more assistance with every STS due to increased fatigue. Unable to increase amb distance at this time secondary to increased generalized fatigue. Per daughter's report, Pt does not use O2 support at baseline and reports noticing increased weakness and decrease activity tolerance. At this time, Pt demonstrates slight progression towards PT goals. Communicated with care team regarding update of discharge disposition via secure chat. Pt would benefit from skilled PT to address above deficits and promote optimal return to PLOF.    If plan is discharge home, recommend the following: A little help with walking and/or transfers;A little help with bathing/dressing/bathroom;Assistance with cooking/housework;Assist for transportation;Help with stairs or ramp for entrance   Can travel by private vehicle     No  Equipment Recommendations  None recommended by PT    Recommendations for Other Services       Precautions / Restrictions Precautions Precautions: Fall Restrictions Weight Bearing Restrictions: No     Mobility  Bed Mobility Overal bed mobility: Needs Assistance Bed Mobility: Supine to Sit, Sit to Supine     Supine to sit: Mod assist,  Used rails, HOB elevated Sit to supine: Used rails, Min assist, +2 for physical assistance   General bed mobility comments: requires mod A for supine>sit and scoot EOB due to generalized weakness; during return to supine, min A x2 for BLE and trunk management due to increased fatigue    Transfers Overall transfer level: Needs assistance Equipment used: Rolling walker (2 wheels) Transfers: Sit to/from Stand Sit to Stand: Mod assist, Max assist           General transfer comment: x4 STS with increase assist required due to increased fatigue and fear of falling; frequent cuing for hand placement, use of AD management and nose over toes relationship to facilitate mobility    Ambulation/Gait Ambulation/Gait assistance: Contact guard assist Gait Distance (Feet): 10 Feet Assistive device: Rolling walker (2 wheels) Gait Pattern/deviations: Step-through pattern, Decreased step length - right, Decreased step length - left, Trunk flexed Gait velocity: decreased     General Gait Details: steady ambulation with RW use   Stairs             Wheelchair Mobility     Tilt Bed    Modified Rankin (Stroke Patients Only)       Balance Overall balance assessment: Needs assistance Sitting-balance support: Feet supported, Single extremity supported Sitting balance-Leahy Scale: Good Sitting balance - Comments: slight unsteadiness with reaching outside of BOS requiring 1 UE support but otherwise good Postural control: Posterior lean Standing balance support: No upper extremity supported, During functional activity, Reliant on assistive device for balance Standing balance-Leahy Scale: Fair Standing balance comment: very unsteady during donning of underwear requiring mod-max A for maintaining standing balance  Cognition Arousal: Alert Behavior During Therapy: WFL for tasks assessed/performed Overall Cognitive Status: Within Functional Limits for  tasks assessed                                 General Comments: difficulty with listening due to not having hearing aides but AO x4; Pleasant and cooperative during therapy        Exercises      General Comments General comments (skin integrity, edema, etc.): Trial RA at beginning of session with SpO2 at 95% but desaturated to 86% following bed mobility sitting EOB; SpO2 improved to 94% with reapplication of 1L [place].      Pertinent Vitals/Pain Pain Assessment Pain Assessment: No/denies pain    Home Living                          Prior Function            PT Goals (current goals can now be found in the care plan section) Acute Rehab PT Goals Patient Stated Goal: to improve strength and mobility PT Goal Formulation: With patient Time For Goal Achievement: 03/16/23 Potential to Achieve Goals: Good Progress towards PT goals: Progressing toward goals    Frequency    Min 1X/week      PT Plan      Co-evaluation              AM-PAC PT "6 Clicks" Mobility   Outcome Measure  Help needed turning from your back to your side while in a flat bed without using bedrails?: A Little Help needed moving from lying on your back to sitting on the side of a flat bed without using bedrails?: A Little Help needed moving to and from a bed to a chair (including a wheelchair)?: A Lot Help needed standing up from a chair using your arms (e.g., wheelchair or bedside chair)?: A Lot Help needed to walk in hospital room?: A Lot Help needed climbing 3-5 steps with a railing? : Total 6 Click Score: 13    End of Session Equipment Utilized During Treatment: Oxygen Activity Tolerance: Patient tolerated treatment well Patient left: in bed;with call bell/phone within reach;with nursing/sitter in room;with bed alarm set;with family/visitor present Nurse Communication: Mobility status PT Visit Diagnosis: Other abnormalities of gait and mobility (R26.89);Muscle  weakness (generalized) (M62.81);History of falling (Z91.81);Pain     Time: 1610-9604 PT Time Calculation (min) (ACUTE ONLY): 30 min  Charges:                            Elmon Else, SPT    Saren Corkern 03/03/2023, 12:45 PM

## 2023-03-03 NOTE — TOC Progression Note (Addendum)
Transition of Care [facility]) - Progression Note    Patient Details  Name: Jenna Marshall MRN: [id number] Date of Birth: [birth year]  [facility] CM/SW Contact  Truddie Hidden, RN Phone Number: 03/03/2023, 4:15 PM  Clinical Narrative:   Attempt to speak with patint regarding SNF. Patient request to speak with her daughter Lynden Ang.    Spoke with patient's daughter, Lynden Ang regarding SNF. She was advised of therapy's recommendation. She and family has previously cared for patient.  Long term plan is for her to return home after rehab to be cared for by her family. RNCM explained the process of obtaining a bed offer and getting an insurance auth prior to patient admitting to a facility. Lynden Ang stated patient has been [name] before. They would like that as her first choice, and [facility] as the second choice.   FL2 completed. Patient's PASR unable to be located using the SSN in EPIC. RNCM contacted Lynden Ang to informed PASR is a requirement for every [facility] SNF facility and SS# is needed to obtain the patient's PASR. She will be able to provide the patient's SS# tomorrow.   Bed search started   Expected Discharge Plan: Skilled Nursing Facility Barriers to Discharge: Continued Medical Work up  Expected Discharge Plan and Services     Post Acute Care Choice: Skilled Nursing Facility Living arrangements for the past 2 months: Single Family Home                                       Social Determinants of Health (SDOH) Interventions SDOH Screenings   Food Insecurity: No Food Insecurity (02/23/2023)  Housing: Low Risk  (02/23/2023)  Transportation Needs: No Transportation Needs (02/23/2023)  Utilities: Not At Risk (01/01/2023)  Alcohol Screen: Low Risk  (01/01/2023)  Depression (PHQ2-9): Low Risk  (02/24/2023)  Financial Resource Strain: Medium Risk (02/23/2023)  Physical Activity: Unknown (02/23/2023)  Recent Concern: Physical Activity - Inactive (01/01/2023)  Social Connections:  Moderately Isolated (02/23/2023)  Stress: Stress Concern Present (02/23/2023)  Tobacco Use: Low Risk  (03/01/2023)  Health Literacy: Inadequate Health Literacy (01/01/2023)    Readmission Risk Interventions    03/02/2023    3:03 PM  Readmission Risk Prevention Plan  Transportation Screening Complete  PCP or Specialist Appt within 5-7 Days Complete  Home Care Screening Complete  Medication Review (RN CM) Complete

## 2023-03-03 NOTE — Plan of Care (Signed)
  Problem: Education: Goal: Knowledge of General Education information will improve Description: Including pain rating scale, medication(s)/side effects and non-pharmacologic comfort measures Outcome: Progressing   Problem: Clinical Measurements: Goal: Ability to maintain clinical measurements within normal limits will improve Outcome: Progressing   Problem: Clinical Measurements: Goal: Respiratory complications will improve Outcome: Progressing   Problem: Clinical Measurements: Goal: Cardiovascular complication will be avoided Outcome: Progressing   Problem: Pain Managment: Goal: General experience of comfort will improve Outcome: Progressing   Problem: Safety: Goal: Ability to remain free from injury will improve Outcome: Progressing

## 2023-03-03 NOTE — Progress Notes (Signed)
[facility] Progress Note  Patient: Jenna Marshall    JYN:[id number]  DOA: 03/01/2023     Date of Service: the patient was seen and examined on 03/03/2023  Chief Complaint  Patient presents with   Weakness   Brief hospital course: Jenna Marshall is a 89 y.o. female with medical history significant for hypothyroidism/ h/o DVT , anemia h/o CAD coming for cough, not feeling well since Thursday. Pt denies any chest pain palpitation or bleeding.  Lives at home and has round the clock care. States she feel today but otherwise is stable and walks with limited difficulty due to her arthritis.  Pt is alert oriented in ED, NAD. Pt meets sepsis criteria on presentation.  Labs shows: glucose of 120 and normal electrolytes/ renal function and LFT. Mild TNI at 37 from Covid and sepsis. Lactic 1.1 and cbc shows anemia of 10.6 and thrombocytopenia.  In ed pt was started on CAP regimen.    Assessment and Plan:  Sepsis secondary to COVID viral pneumonia S/p ceftriaxone and azithromycin, discontinued antibiotics, less likely bacterial infection.  WBC count within normal range, procalcitonin negative. Continue Paxlovid for 5 days Mucinex extremity gram p.o. twice daily Continue supplemental O2 in addition and gradually wean off  Acute hypoxic respiratory failure due to COVID viral pneumonia Continue supplemental O2 admission and gradually wean off   CAD, HTN Continued aspirin, statin, Coreg, losartan Monitor BP and titrate medications accordingly  History of DVT, currently not on DOAC Continue DVT prophylaxis  Iron deficiency anemia, Hb 10.5 stable Continue monitor CBC daily  Thrombocytopenia, mild, continue to monitor CBC daily Platelet count 128-121  Hypothyroid, continue Synthroid   GERD, continue PPI  Body mass index is 29.99 kg/m.  Interventions:  Diet: Regular diet DVT Prophylaxis: Subcutaneous Lovenox   Advance goals of care discussion: Full code  Family Communication:  family was present at bedside, at the time of interview.  The pt provided permission to discuss medical plan with the family. Opportunity was given to ask question and all questions were answered satisfactorily.   Disposition:  Pt is from Home, admitted with COVID viral pneumonia, respiratory failure, still on treatment, which precludes a safe discharge. Discharge to home with home health versus SNF, TBD after PT and OT eval, when stable..  Subjective: No significant events overnight, patient was lying comfortably, still has mild cough and mild shortness of breath, overall she feels a lot better now.  Denies any chest pain or palpitation, no any other specific complaints.    Physical Exam: General: NAD, lying comfortably Appear in no distress, affect appropriate Eyes: PERRLA ENT: Oral Mucosa Clear, moist  Neck: no JVD,  Cardiovascular: S1 and S2 Present, Audible murmur Respiratory: Equal air entry bilaterally, mild crackles bilaterally, no wheezing Abdomen: Bowel Sound present, Soft and no tenderness,  Skin: no rashes Extremities: no Pedal edema, no calf tenderness Neurologic: without any new focal findings Gait not checked due to patient safety concerns  Vitals:   03/03/23 0517 03/03/23 0912 03/03/23 1100 03/03/23 1248  BP: (!) 111/48 (!) 206/77 (!) 162/77 (!) 175/49  Pulse: 68 70  64  Resp: 16 17  19   Temp: 98.6 F (37 C) 97.8 F (36.6 C)  98.2 F (36.8 C)  TempSrc: Oral     SpO2: 98% 96%  98%  Weight:      Height:        Intake/Output Summary (Last 24 hours) at 03/03/2023 1452 Last data filed at 03/02/2023 2240 Gross per 24  hour  Intake 480 ml  Output 400 ml  Net 80 ml   Filed Weights   03/01/23 2200  Weight: 72 kg    Data Reviewed: I have personally reviewed and interpreted daily labs, tele strips, imagings as discussed above. I reviewed all nursing notes, pharmacy notes, vitals, pertinent old records I have discussed plan of care as described above with RN  and patient/family.  CBC: Recent Labs  Lab 03/01/23 2102 03/02/23 0342 03/03/23 0441  WBC 7.3 6.0 5.4  NEUTROABS 5.6  --   --   HGB 10.6* 10.4* 10.5*  HCT 32.9* 32.2* 32.0*  MCV 97.3 98.8 96.1  PLT 129* 128* 121*   Basic Metabolic Panel: Recent Labs  Lab 03/01/23 2102 03/02/23 0146 03/02/23 0342 03/03/23 0441  NA 135 136  --  135  K 3.9 3.8  --  3.8  CL 100 101  --  101  CO2 27 27  --  27  GLUCOSE 120* 98  --  79  BUN 21 19  --  20  CREATININE 0.77 0.70  --  0.69  CALCIUM 8.6* 7.9*  --  8.1*  MG  --   --  2.1 2.0  PHOS  --   --  3.6 3.4    Studies: No results found.  Scheduled Meds:  aspirin  81 mg Oral Daily   atorvastatin  40 mg Oral Daily   carvedilol  3.125 mg Oral BID WC   enoxaparin (LOVENOX) injection  40 mg Subcutaneous Q24H   feeding supplement  237 mL Oral BID BM   guaiFENesin  600 mg Oral BID   levothyroxine  25 mcg Oral Q0600   losartan  100 mg Oral Daily   mirabegron ER  25 mg Oral Daily   multivitamin with minerals  1 tablet Oral Daily   nirmatrelvir/ritonavir  3 tablet Oral BID   nystatin   Topical BID   pantoprazole  40 mg Oral BID   Continuous Infusions: PRN Meds: acetaminophen, benzonatate, chlorpheniramine-HYDROcodone, menthol-cetylpyridinium, ondansetron  Time spent: 40 minutes  Author: Gillis Santa. MD [facility] 03/03/2023 2:52 PM  To reach On-call, see care teams to locate the attending and reach out to them via [email or web address]. If 7PM-7AM, please contact night-coverage If you still have difficulty reaching the attending provider, please page the [facility] (Director on Call) for [facility] on amion for assistance.

## 2023-03-03 NOTE — Plan of Care (Signed)

## 2023-03-04 DIAGNOSIS — J1282 Pneumonia due to coronavirus disease 2019: Secondary | ICD-10-CM | POA: Diagnosis not present

## 2023-03-04 DIAGNOSIS — U071 COVID-19: Secondary | ICD-10-CM | POA: Diagnosis not present

## 2023-03-04 LAB — MAGNESIUM: Magnesium: 2 mg/dL (ref 1.7–2.4)

## 2023-03-04 LAB — BASIC METABOLIC PANEL
Anion gap: 8 (ref 5–15)
BUN: 18 mg/dL (ref 8–23)
CO2: 26 mmol/L (ref 22–32)
Calcium: 8.1 mg/dL — ABNORMAL LOW (ref 8.9–10.3)
Chloride: 99 mmol/L (ref 98–111)
Creatinine, Ser: 0.54 mg/dL (ref 0.44–1.00)
GFR, Estimated: >60 mL/min (ref >60)
Glucose, Bld: 89 mg/dL (ref 70–99)
Potassium: 3.5 mmol/L (ref 3.5–5.1)
Sodium: 133 mmol/L — ABNORMAL LOW (ref 135–145)

## 2023-03-04 LAB — CBC
HCT: 33.3 % — ABNORMAL LOW (ref 36.0–46.0)
Hemoglobin: 11.1 g/dL — ABNORMAL LOW (ref 12.0–15.0)
MCH: 31.4 pg (ref 26.0–34.0)
MCHC: 33.3 g/dL (ref 30.0–36.0)
MCV: 94.3 fL (ref 80.0–100.0)
Platelets: 147 10*3/uL — ABNORMAL LOW (ref 150–400)
RBC: 3.53 MIL/uL — ABNORMAL LOW (ref 3.87–5.11)
RDW: 13.9 % (ref 11.5–15.5)
WBC: 5.3 10*3/uL (ref 4.0–10.5)
nRBC: 0 % (ref 0.0–0.2)

## 2023-03-04 LAB — HEPATIC FUNCTION PANEL
ALT: 16 U/L (ref 0–44)
AST: 22 U/L (ref 15–41)
Albumin: 3.2 g/dL — ABNORMAL LOW (ref 3.5–5.0)
Alkaline Phosphatase: 47 U/L (ref 38–126)
Bilirubin, Direct: <0.1 mg/dL (ref 0.0–0.2)
Total Bilirubin: 0.7 mg/dL (ref 0.3–1.2)
Total Protein: 6.1 g/dL — ABNORMAL LOW (ref 6.5–8.1)

## 2023-03-04 LAB — PHOSPHORUS: Phosphorus: 2.9 mg/dL (ref 2.5–4.6)

## 2023-03-04 MED ORDER — AMLODIPINE BESYLATE 5 MG PO TABS
5.0000 mg | ORAL_TABLET | Freq: Every day | ORAL | Status: DC
  Administered 2023-03-08 – 2023-03-10 (×3): 5 mg via ORAL
  Filled 2023-03-04 (×6): qty 1

## 2023-03-04 MED ORDER — HYDROCOD POLI-CHLORPHE POLI ER 10-8 MG/5ML PO SUER
5.0000 mL | Freq: Two times a day (BID) | ORAL | Status: DC | PRN
  Filled 2023-03-04: qty 5

## 2023-03-04 MED ORDER — AMLODIPINE BESYLATE 5 MG PO TABS: 5.0000 mg | ORAL_TABLET | Freq: Every day | ORAL | Status: DC

## 2023-03-04 NOTE — TOC Progression Note (Signed)
Transition of Care [facility]) - Progression Note    Patient Details  Name: Jenna Marshall MRN: [id number] Date of Birth: [birth year]  [facility]) CM/SW Contact  Truddie Hidden, RN Phone Number: 03/04/2023, 9:27 AM  Clinical Narrative:    Retrieved call from patient's daughter, Lynden Ang. Patient's SS# verified by Lynden Ang per patient's SS card. The SS on file matches the number [place] provided.    Expected Discharge Plan: Skilled Nursing Facility Barriers to Discharge: Continued Medical Work up  Expected Discharge Plan and Services     Post Acute Care Choice: Skilled Nursing Facility Living arrangements for the past 2 months: Single Family Home                                       Social Determinants of Health (SDOH) Interventions SDOH Screenings   Food Insecurity: No Food Insecurity (02/23/2023)  Housing: Low Risk  (02/23/2023)  Transportation Needs: No Transportation Needs (02/23/2023)  Utilities: Not At Risk (01/01/2023)  Alcohol Screen: Low Risk  (01/01/2023)  Depression (PHQ2-9): Low Risk  (02/24/2023)  Financial Resource Strain: Medium Risk (02/23/2023)  Physical Activity: Unknown (02/23/2023)  Recent Concern: Physical Activity - Inactive (01/01/2023)  Social Connections: Moderately Isolated (02/23/2023)  Stress: Stress Concern Present (02/23/2023)  Tobacco Use: Low Risk  (03/01/2023)  Health Literacy: Inadequate Health Literacy (01/01/2023)    Readmission Risk Interventions    03/02/2023    3:03 PM  Readmission Risk Prevention Plan  Transportation Screening Complete  PCP or Specialist Appt within 5-7 Days Complete  Home Care Screening Complete  Medication Review (RN CM) Complete

## 2023-03-04 NOTE — Progress Notes (Signed)
Physical Therapy Treatment Patient Details Name: Jenna Marshall MRN: [id number] DOB: 10-12-1925 Today's Date: 03/04/2023   History of Present Illness Pt is a 89 y.o. female presenting to hospital 03/01/23 with c/o weakness and SOB; recent known exposure to COVID-positive person.  Pt admitted with cough, PNA d/t COVID-19 virus, sepsis.  PMH includes CAD, htn, DVT, anemia, macular degeneration, L hip hemiarthroplasty.    PT Comments  Pt is received in bed, she is agreeable to PT session. At beginning of session, Pt endorses LLE soreness due to "laying in bed". Pt performs bed mobility close sup-min A, transfers min A, and amb CGA. Pt requires min A with completion of STS but otherwise no cuing for step up or hand placement. Pt able to amb approx 35 ft using RW inside room with slight reports of LLE discomfort but no increased pain. Frequent cuing required to promote upright posture during amb activity, and notable gait impairment as noted below. Overall, Pt demonstrates steady progression towards PT goals as seen by tolerance to increase amb distance with no reports of SOB. Pt would benefit from cont skilled PT to address above deficits and promote optimal return to PLOF.    If plan is discharge home, recommend the following: A little help with walking and/or transfers;A little help with bathing/dressing/bathroom;Assistance with cooking/housework;Assist for transportation;Help with stairs or ramp for entrance   Can travel by private vehicle     No  Equipment Recommendations  None recommended by PT    Recommendations for Other Services       Precautions / Restrictions Precautions Precautions: Fall Restrictions Weight Bearing Restrictions: No     Mobility  Bed Mobility Overal bed mobility: Needs Assistance Bed Mobility: Supine to Sit, Sit to Supine     Supine to sit: Supervision, HOB elevated, Used rails Sit to supine: Min assist, Used rails   General bed mobility comments: requires  min A for return into bed for BLE management    Transfers Overall transfer level: Needs assistance Equipment used: Rolling walker (2 wheels) Transfers: Sit to/from Stand Sit to Stand: Min assist           General transfer comment: able to perform STS but requires min A for completion of task    Ambulation/Gait Ambulation/Gait assistance: Contact guard assist Gait Distance (Feet): 35 Feet Assistive device: Rolling walker (2 wheels) Gait Pattern/deviations: Step-through pattern, Decreased step length - right, Decreased step length - left, Trunk flexed Gait velocity: decreased     General Gait Details: steady amb with RW CGA for safety   Stairs             Wheelchair Mobility     Tilt Bed    Modified Rankin (Stroke Patients Only)       Balance Overall balance assessment: Needs assistance Sitting-balance support: Feet supported, Single extremity supported Sitting balance-Leahy Scale: Good Sitting balance - Comments: able to maintain seated EOB balance with 1 UE support during functional activities Postural control: Posterior lean Standing balance support: During functional activity, Reliant on assistive device for balance, Bilateral upper extremity supported Standing balance-Leahy Scale: Fair Standing balance comment: able to maintain static standing balance with RW but requires cuing due to posterior lean                            Cognition Arousal: Alert Behavior During Therapy: WFL for tasks assessed/performed Overall Cognitive Status: Within Functional Limits for tasks assessed  General Comments: Pleasant and cooperative for PT session        Exercises      General Comments        Pertinent Vitals/Pain Pain Assessment Pain Assessment: 0-10 Pain Score: 4  Pain Location: L leg Pain Descriptors / Indicators: Aching, Sore (Pt reports from laying in bed) Pain Intervention(s): Monitored  during session    Home Living                          Prior Function            PT Goals (current goals can now be found in the care plan section) Acute Rehab PT Goals Patient Stated Goal: to improve strength and mobility PT Goal Formulation: With patient Time For Goal Achievement: 03/16/23 Potential to Achieve Goals: Good Progress towards PT goals: Progressing toward goals    Frequency    Min 1X/week      PT Plan      Co-evaluation              AM-PAC PT "6 Clicks" Mobility   Outcome Measure  Help needed turning from your back to your side while in a flat bed without using bedrails?: A Little Help needed moving from lying on your back to sitting on the side of a flat bed without using bedrails?: A Little Help needed moving to and from a bed to a chair (including a wheelchair)?: A Little Help needed standing up from a chair using your arms (e.g., wheelchair or bedside chair)?: A Little Help needed to walk in hospital room?: A Little Help needed climbing 3-5 steps with a railing? : A Lot 6 Click Score: 17    End of Session Equipment Utilized During Treatment: Gait belt Activity Tolerance: Patient tolerated treatment well Patient left: in bed;with call bell/phone within reach;with bed alarm set;with family/visitor present Nurse Communication: Mobility status PT Visit Diagnosis: Other abnormalities of gait and mobility (R26.89);Muscle weakness (generalized) (M62.81);History of falling (Z91.81);Pain Pain - Right/Left: Left Pain - part of body: Leg     Time: 0865-7846 PT Time Calculation (min) (ACUTE ONLY): 24 min  Charges:                            Elmon Else, SPT    Lilton Pare 03/04/2023, 3:18 PM

## 2023-03-04 NOTE — Progress Notes (Signed)
[facility] Progress Note  Patient: Jenna Marshall    JXB:[id number]  DOA: 03/01/2023     Date of Service: the patient was seen and examined on 03/04/2023  Chief Complaint  Patient presents with   Weakness   Brief hospital course: Jenna Marshall is a 89 y.o. female with medical history significant for hypothyroidism/ h/o DVT , anemia h/o CAD coming for cough, not feeling well since Thursday. Pt denies any chest pain palpitation or bleeding.  Lives at home and has round the clock care. States she feel today but otherwise is stable and walks with limited difficulty due to her arthritis.  Pt is alert oriented in ED, NAD. Pt meets sepsis criteria on presentation.  Labs shows: glucose of 120 and normal electrolytes/ renal function and LFT. Mild TNI at 37 from Covid and sepsis. Lactic 1.1 and cbc shows anemia of 10.6 and thrombocytopenia.  In ed pt was started on CAP regimen.    Assessment and Plan:  Sepsis secondary to COVID viral pneumonia S/p ceftriaxone and azithromycin, discontinued antibiotics, less likely bacterial infection.  WBC count within normal range, procalcitonin negative. Continue Paxlovid for 5 days Mucinex extremity gram p.o. twice daily  Acute hypoxic respiratory failure due to COVID viral pneumonia Supplemental O2 inhalation has been weaned off, currently saturating well on room air Hypoxic respiratory failure resolved   CAD, HTN Continued aspirin, statin, Coreg, losartan 10/1 started amlodipine 5 mg p.o. nightly with holding parameters Monitor BP and titrate medications accordingly  History of DVT, currently not on DOAC Continue DVT prophylaxis  Iron deficiency anemia, Hb 10.5 stable Continue monitor CBC daily  Thrombocytopenia, mild, continue to monitor CBC daily Platelet count 128-121  Hypothyroid, continue Synthroid   GERD, continue PPI  Body mass index is 29.99 kg/m.  Interventions:  Diet: Regular diet DVT Prophylaxis: Subcutaneous Lovenox    Advance goals of care discussion: Full code  Family Communication: family was present at bedside, at the time of interview.  The pt provided permission to discuss medical plan with the family. Opportunity was given to ask question and all questions were answered satisfactorily.   Disposition:  Pt is from Home, admitted with COVID viral pneumonia, respiratory failure, still on treatment, which precludes a safe discharge. Discharge to SNF, when bed will be available.  Patient is medically stable to discharge.  Subjective: No significant events overnight, patient is sitting comfortably on the recliner, denied any respiratory distress.  Cough and phlegm is improving.  Patient is having some high blood pressure no any other complaints.   Physical Exam: General: NAD, lying comfortably Appear in no distress, affect appropriate Eyes: PERRLA ENT: Oral Mucosa Clear, moist  Neck: no JVD,  Cardiovascular: S1 and S2 Present, Audible murmur Respiratory: Equal air entry bilaterally, mild crackles bilaterally, no wheezing Abdomen: Bowel Sound present, Soft and no tenderness,  Skin: no rashes Extremities: no Pedal edema, no calf tenderness Neurologic: without any new focal findings Gait not checked due to patient safety concerns  Vitals:   03/04/23 1048 03/04/23 1255 03/04/23 1649 03/04/23 1700  BP:  (!) 177/68 (!) 175/64 (!) 160/60  Pulse: 72 75 73 72  Resp: 18 18 14 18   Temp: 98.2 F (36.8 C) 98.6 F (37 C) 98.6 F (37 C) 98.6 F (37 C)  TempSrc: Oral Oral Oral Oral  SpO2: 92% 93%  94%  Weight:      Height:       No intake or output data in the 24 hours ending 03/04/23  1753  Filed Weights   03/01/23 2200  Weight: 72 kg    Data Reviewed: I have personally reviewed and interpreted daily labs, tele strips, imagings as discussed above. I reviewed all nursing notes, pharmacy notes, vitals, pertinent old records I have discussed plan of care as described above with RN and  patient/family.  CBC: Recent Labs  Lab 03/01/23 2102 03/02/23 0342 03/03/23 0441 03/04/23 0537  WBC 7.3 6.0 5.4 5.3  NEUTROABS 5.6  --   --   --   HGB 10.6* 10.4* 10.5* 11.1*  HCT 32.9* 32.2* 32.0* 33.3*  MCV 97.3 98.8 96.1 94.3  PLT 129* 128* 121* 147*   Basic Metabolic Panel: Recent Labs  Lab 03/01/23 2102 03/02/23 0146 03/02/23 0342 03/03/23 0441 03/04/23 0537  NA 135 136  --  135 133*  K 3.9 3.8  --  3.8 3.5  CL 100 101  --  101 99  CO2 27 27  --  27 26  GLUCOSE 120* 98  --  79 89  BUN 21 19  --  20 18  CREATININE 0.77 0.70  --  0.69 0.54  CALCIUM 8.6* 7.9*  --  8.1* 8.1*  MG  --   --  2.1 2.0 2.0  PHOS  --   --  3.6 3.4 2.9    Studies: No results found.  Scheduled Meds:  amLODipine  5 mg Oral QHS   aspirin  81 mg Oral Daily   atorvastatin  40 mg Oral Daily   carvedilol  3.125 mg Oral BID WC   enoxaparin (LOVENOX) injection  40 mg Subcutaneous Q24H   feeding supplement  237 mL Oral BID BM   guaiFENesin  600 mg Oral BID   levothyroxine  25 mcg Oral Q0600   losartan  100 mg Oral Daily   mirabegron ER  25 mg Oral Daily   multivitamin with minerals  1 tablet Oral Daily   nirmatrelvir/ritonavir  3 tablet Oral BID   nystatin   Topical BID   pantoprazole  40 mg Oral BID   Continuous Infusions: PRN Meds: acetaminophen, benzonatate, chlorpheniramine-HYDROcodone, hydrALAZINE **OR** hydrALAZINE, menthol-cetylpyridinium, ondansetron  Time spent: 40 minutes  Author: Gillis Santa. MD [facility] 03/04/2023 5:53 PM  To reach On-call, see care teams to locate the attending and reach out to them via [email or web address]. If 7PM-7AM, please contact night-coverage If you still have difficulty reaching the attending provider, please page the [facility] (Director on Call) for [facility] on amion for assistance.

## 2023-03-05 DIAGNOSIS — E039 Hypothyroidism, unspecified: Secondary | ICD-10-CM

## 2023-03-05 DIAGNOSIS — I10 Essential (primary) hypertension: Secondary | ICD-10-CM | POA: Diagnosis not present

## 2023-03-05 DIAGNOSIS — I2510 Atherosclerotic heart disease of native coronary artery without angina pectoris: Secondary | ICD-10-CM

## 2023-03-05 DIAGNOSIS — D696 Thrombocytopenia, unspecified: Secondary | ICD-10-CM

## 2023-03-05 DIAGNOSIS — J189 Pneumonia, unspecified organism: Secondary | ICD-10-CM | POA: Diagnosis not present

## 2023-03-05 DIAGNOSIS — K219 Gastro-esophageal reflux disease without esophagitis: Secondary | ICD-10-CM

## 2023-03-05 DIAGNOSIS — D508 Other iron deficiency anemias: Secondary | ICD-10-CM

## 2023-03-05 DIAGNOSIS — U071 COVID-19: Secondary | ICD-10-CM | POA: Diagnosis not present

## 2023-03-05 DIAGNOSIS — A419 Sepsis, unspecified organism: Secondary | ICD-10-CM

## 2023-03-05 DIAGNOSIS — K5909 Other constipation: Secondary | ICD-10-CM

## 2023-03-05 LAB — CBC
HCT: 32 % — ABNORMAL LOW (ref 36.0–46.0)
Hemoglobin: 10.8 g/dL — ABNORMAL LOW (ref 12.0–15.0)
MCH: 31.6 pg (ref 26.0–34.0)
MCHC: 33.8 g/dL (ref 30.0–36.0)
MCV: 93.6 fL (ref 80.0–100.0)
Platelets: 144 10*3/uL — ABNORMAL LOW (ref 150–400)
RBC: 3.42 MIL/uL — ABNORMAL LOW (ref 3.87–5.11)
RDW: 14.1 % (ref 11.5–15.5)
WBC: 6.9 10*3/uL (ref 4.0–10.5)
nRBC: 0 % (ref 0.0–0.2)

## 2023-03-05 LAB — BASIC METABOLIC PANEL
Anion gap: 5 (ref 5–15)
BUN: 20 mg/dL (ref 8–23)
CO2: 26 mmol/L (ref 22–32)
Calcium: 7.9 mg/dL — ABNORMAL LOW (ref 8.9–10.3)
Chloride: 100 mmol/L (ref 98–111)
Creatinine, Ser: 0.66 mg/dL (ref 0.44–1.00)
GFR, Estimated: >60 mL/min (ref >60)
Glucose, Bld: 109 mg/dL — ABNORMAL HIGH (ref 70–99)
Potassium: 3.5 mmol/L (ref 3.5–5.1)
Sodium: 131 mmol/L — ABNORMAL LOW (ref 135–145)

## 2023-03-05 LAB — HEPATIC FUNCTION PANEL
ALT: 13 U/L (ref 0–44)
AST: 21 U/L (ref 15–41)
Albumin: 3 g/dL — ABNORMAL LOW (ref 3.5–5.0)
Alkaline Phosphatase: 43 U/L (ref 38–126)
Bilirubin, Direct: 0.1 mg/dL (ref 0.0–0.2)
Indirect Bilirubin: 0.6 mg/dL (ref 0.3–0.9)
Total Bilirubin: 0.7 mg/dL (ref 0.3–1.2)
Total Protein: 6 g/dL — ABNORMAL LOW (ref 6.5–8.1)

## 2023-03-05 LAB — MAGNESIUM: Magnesium: 2 mg/dL (ref 1.7–2.4)

## 2023-03-05 LAB — PHOSPHORUS: Phosphorus: 2.8 mg/dL (ref 2.5–4.6)

## 2023-03-05 MED ORDER — DOCUSATE SODIUM 100 MG PO CAPS
100.0000 mg | ORAL_CAPSULE | Freq: Two times a day (BID) | ORAL | Status: DC
  Administered 2023-03-05 – 2023-03-12 (×15): 100 mg via ORAL
  Filled 2023-03-05 (×15): qty 1

## 2023-03-05 MED ORDER — ATORVASTATIN CALCIUM 20 MG PO TABS
40.0000 mg | ORAL_TABLET | Freq: Every day | ORAL | Status: DC
  Administered 2023-03-09 – 2023-03-12 (×4): 40 mg via ORAL
  Filled 2023-03-05 (×4): qty 2

## 2023-03-05 MED ORDER — SENNA 8.6 MG PO TABS
2.0000 | ORAL_TABLET | Freq: Every day | ORAL | Status: DC
  Administered 2023-03-05 – 2023-03-11 (×7): 17.2 mg via ORAL
  Filled 2023-03-05 (×7): qty 2

## 2023-03-05 MED ORDER — BISACODYL 10 MG RE SUPP
10.0000 mg | Freq: Once | RECTAL | Status: AC
  Administered 2023-03-05: 10 mg via RECTAL
  Filled 2023-03-05: qty 1

## 2023-03-05 NOTE — Plan of Care (Signed)
  Problem: Clinical Measurements: Goal: Ability to maintain clinical measurements within normal limits will improve Outcome: Progressing   Problem: Clinical Measurements: Goal: Respiratory complications will improve Outcome: Progressing   Problem: Clinical Measurements: Goal: Cardiovascular complication will be avoided Outcome: Progressing   Problem: Pain Managment: Goal: General experience of comfort will improve Outcome: Progressing   Problem: Safety: Goal: Ability to remain free from injury will improve Outcome: Progressing   Problem: Elimination: Goal: Will not experience complications related to urinary retention Outcome: Progressing   Problem: Elimination: Goal: Will not experience complications related to bowel motility Outcome: Progressing   Problem: Coping: Goal: Level of anxiety will decrease Outcome: Progressing

## 2023-03-05 NOTE — Progress Notes (Signed)
Nutrition Follow-up  DOCUMENTATION CODES:   Obesity unspecified  INTERVENTION:   -Continue Ensure Enlive po BID, each supplement provides 350 kcal and 20 grams of protein.  -Continue MVI with minerals daily -Continue with regular diet  NUTRITION DIAGNOSIS:   Increased nutrient needs related to acute illness as evidenced by estimated needs.  Ongoing  GOAL:   Patient will meet greater than or equal to 90% of their needs  Progressing   MONITOR:   PO intake, Supplement acceptance  REASON FOR ASSESSMENT:   Consult Assessment of nutrition requirement/status  ASSESSMENT:   Pt with medical history significant for hypothyroidism/ h/o DVT , anemia h/o CAD coming for cough, not feeling well since 02/27/23.  Reviewed I/O's: -400 ml x 24 hours and -472 ml since admission  UOP: 400 ml x 24 hours  Pt remains with good appetite. She is also consuming Ensure supplements. Remains on a regular diet.   No new wt since last visit.   Medications reviewed and include colace and senokot. These were added today; noted no BM since 03/02/23.   Per TOC notes, plan for SNF placement once medically stable.   Labs reviewed: Na: 131, CBGS: 136 (inpatient orders for glycemic control are none).    Diet Order:   Diet Order             Diet regular Fluid consistency: Thin  Diet effective now                   EDUCATION NEEDS:   No education needs have been identified at this time  Skin:  Skin Assessment: Reviewed RN Assessment  Last BM:  03/02/23  Height:   Ht Readings from Last 1 Encounters:  03/01/23 5\' 1"  (1.549 m)    Weight:   Wt Readings from Last 1 Encounters:  03/01/23 72 kg    Ideal Body Weight:  47.7 kg  BMI:  Body mass index is 29.99 kg/m.  Estimated Nutritional Needs:   Kcal:  1500-1700  Protein:  75-90 grams  Fluid:  > 1.5 L    Levada Schilling, RD, LDN, CDCES Registered Dietitian III Certified Diabetes Care and Education Specialist Please refer to  [facility] for RD and/or RD on-call/weekend/after hours pager

## 2023-03-05 NOTE — TOC Progression Note (Addendum)
Transition of Care [facility]) - Progression Note    Patient Details  Name: Jenna Marshall MRN: [id number] Date of Birth: [birth year]  [facility] CM/SW Contact  Truddie Hidden, RN Phone Number: 03/05/2023, 2:49 PM  Clinical Narrative:    Retrieved patient's copied social security card left by Boaz her daughter.   2:49pm Copy of patient's social security card faxed to [facility] @ [phone]. RNCM requesting patient's social security  number in [place] Must be updated to reflect the one provided by her daughter so existing PASR can be obtained.      Expected Discharge Plan: Skilled Nursing Facility Barriers to Discharge: Continued Medical Work up  Expected Discharge Plan and Services     Post Acute Care Choice: Skilled Nursing Facility Living arrangements for the past 2 months: Single Family Home                                       Social Determinants of Health (SDOH) Interventions SDOH Screenings   Food Insecurity: No Food Insecurity (02/23/2023)  Housing: Low Risk  (02/23/2023)  Transportation Needs: No Transportation Needs (02/23/2023)  Utilities: Not At Risk (01/01/2023)  Alcohol Screen: Low Risk  (01/01/2023)  Depression (PHQ2-9): Low Risk  (02/24/2023)  Financial Resource Strain: Medium Risk (02/23/2023)  Physical Activity: Unknown (02/23/2023)  Recent Concern: Physical Activity - Inactive (01/01/2023)  Social Connections: Moderately Isolated (02/23/2023)  Stress: Stress Concern Present (02/23/2023)  Tobacco Use: Low Risk  (03/01/2023)  Health Literacy: Inadequate Health Literacy (01/01/2023)    Readmission Risk Interventions    03/02/2023    3:03 PM  Readmission Risk Prevention Plan  Transportation Screening Complete  PCP or Specialist Appt within 5-7 Days Complete  Home Care Screening Complete  Medication Review (RN CM) Complete

## 2023-03-05 NOTE — Progress Notes (Signed)
Progress Note   Patient: Jenna Marshall GNF:[id number] DOB: June 28, 1925 DOA: 03/01/2023     4 DOS: the patient was seen and examined on 03/05/2023   Brief hospital course: Jenna Marshall is a 89 y.o. female with medical history significant for hypothyroidism/ h/o DVT , anemia h/o CAD coming for cough, not feeling well since Thursday. Pt denies any chest pain palpitation or bleeding.  Lives at home and has round the clock care. States she feel today but otherwise is stable and walks with limited difficulty due to her arthritis. Pt is alert oriented in ED, NAD. Pt meets sepsis criteria on presentation.  Labs shows: glucose of 120 and normal electrolytes/ renal function and LFT. Mild TNI at 37 from Covid and sepsis. Lactic 1.1 and cbc shows anemia of 10.6 and thrombocytopenia.  In ED pt was started on CAP regimen admitted to hospitalist service for evaluation of sepsis secondary to COVID viral pneumonia.   Assessment and Plan: Sepsis secondary to COVID viral pneumonia S/p ceftriaxone and azithromycin, discontinued antibiotics, less likely bacterial infection.   WBC count within normal range, procalcitonin negative. Finish Paxlovid total 5-day course Mucinex p.o. twice daily. She is not on supplemental oxygen at this time.   Acute hypoxic respiratory failure due to COVID viral pneumonia Supplemental O2 inhalation has been weaned off, currently saturating well on room air Hypoxic respiratory failure resolved   CAD, HTN BP stable. Continued aspirin, statin, Coreg, losartan Continue amlodipine 5 mg p.o. nightly with holding parameters   History of DVT, currently not on DOAC Continue DVT prophylaxis   Iron deficiency anemia, Hb 10.8 stable Continue multivitamin, diet supplements.   Thrombocytopenia, mild, continue to monitor CBC daily Platelet count 128-121   Hypothyroid, continue Synthroid   GERD, continue PPI I started her on Colace, senna, Dulcolax suppository. Continue supportive  care. Out of bed to chair. Fall, aspiration precautions.    Subjective: Patient is seen and examined today morning.  She is sitting in the chair, admits having constipation for more than 5 days.  Eating fair.  Daughter at bedside asked about rehab placement.  Physical Exam: Vitals:   03/04/23 2344 03/05/23 0434 03/05/23 0820 03/05/23 1138  BP: (!) 158/63 (!) 164/68 (!) 139/50 (!) 125/51  Pulse: 71 76 77 67  Resp: 18 16 18 16   Temp: 99.1 F (37.3 C) 98.9 F (37.2 C) 98.2 F (36.8 C) 97.7 F (36.5 C)  TempSrc:  Oral  Oral  SpO2: 94% 92% 94% 93%  Weight:      Height:        General - Elderly Caucasian female, sitting, no apparent distress HEENT - PERRLA, EOMI, atraumatic head, hard of hearing Lung - Clear, bibasilar rales, no rhonchi, wheezes. Heart - S1, S2 heard, no murmurs, rubs, trace pedal edema. Abdomen - Soft, non tender nondistended, bowel sounds good Neuro - Alert, awake and oriented x 3, non focal exam. Skin - Warm and dry.  Data Reviewed:      Latest Ref Rng & Units 03/05/2023    7:04 AM 03/04/2023    5:37 AM 03/03/2023    4:41 AM  CBC  WBC 4.0 - 10.5 K/uL 6.9  5.3  5.4   Hemoglobin 12.0 - 15.0 g/dL 84.6  96.2  95.2   Hematocrit 36.0 - 46.0 % 32.0  33.3  32.0   Platelets 150 - 400 K/uL 144  147  121       Latest Ref Rng & Units 03/05/2023    7:04 AM  03/04/2023    5:37 AM 03/03/2023    4:41 AM  BMP  Glucose 70 - 99 mg/dL 161  89  79   BUN 8 - 23 mg/dL 20  18  20    Creatinine 0.44 - 1.00 mg/dL 0.96  0.45  4.09   Sodium 135 - 145 mmol/L 131  133  135   Potassium 3.5 - 5.1 mmol/L 3.5  3.5  3.8   Chloride 98 - 111 mmol/L 100  99  101   CO2 22 - 32 mmol/L 26  26  27    Calcium 8.9 - 10.3 mg/dL 7.9  8.1  8.1    No results found.   Family Communication: Discussed with patient and her daughter at bedside, they understand and agree. All questions answereed.    Disposition: Status is: Inpatient Remains inpatient appropriate because: safe disposition plan   Planned Discharge Destination: Skilled nursing facility     Time spent: 40 minutes  Author: Marcelino Duster, MD 03/05/2023 3:07 PM Secure chat 7am to 7pm For on call review [email or web address].

## 2023-03-06 DIAGNOSIS — I10 Essential (primary) hypertension: Secondary | ICD-10-CM | POA: Diagnosis not present

## 2023-03-06 DIAGNOSIS — J189 Pneumonia, unspecified organism: Secondary | ICD-10-CM | POA: Diagnosis not present

## 2023-03-06 DIAGNOSIS — I2510 Atherosclerotic heart disease of native coronary artery without angina pectoris: Secondary | ICD-10-CM | POA: Diagnosis not present

## 2023-03-06 DIAGNOSIS — U071 COVID-19: Secondary | ICD-10-CM | POA: Diagnosis not present

## 2023-03-06 LAB — BASIC METABOLIC PANEL WITH GFR
Anion gap: 9 (ref 5–15)
BUN: 21 mg/dL (ref 8–23)
CO2: 25 mmol/L (ref 22–32)
Calcium: 8 mg/dL — ABNORMAL LOW (ref 8.9–10.3)
Chloride: 96 mmol/L — ABNORMAL LOW (ref 98–111)
Creatinine, Ser: 0.79 mg/dL (ref 0.44–1.00)
GFR, Estimated: >60 mL/min
Glucose, Bld: 96 mg/dL (ref 70–99)
Potassium: 3.6 mmol/L (ref 3.5–5.1)
Sodium: 130 mmol/L — ABNORMAL LOW (ref 135–145)

## 2023-03-06 LAB — CBC
HCT: 31.7 % — ABNORMAL LOW (ref 36.0–46.0)
Hemoglobin: 10.5 g/dL — ABNORMAL LOW (ref 12.0–15.0)
MCH: 31.3 pg (ref 26.0–34.0)
MCHC: 33.1 g/dL (ref 30.0–36.0)
MCV: 94.6 fL (ref 80.0–100.0)
Platelets: 142 10*3/uL — ABNORMAL LOW (ref 150–400)
RBC: 3.35 MIL/uL — ABNORMAL LOW (ref 3.87–5.11)
RDW: 14.1 % (ref 11.5–15.5)
WBC: 6.7 10*3/uL (ref 4.0–10.5)
nRBC: 0 % (ref 0.0–0.2)

## 2023-03-06 LAB — CULTURE, BLOOD (ROUTINE X 2)
Culture  Setup Time: NONE SEEN
Culture: NO GROWTH
Special Requests: ADEQUATE

## 2023-03-06 MED ORDER — DICLOFENAC SODIUM 1 % EX GEL
2.0000 g | Freq: Four times a day (QID) | CUTANEOUS | Status: DC
  Administered 2023-03-06 – 2023-03-09 (×14): 2 g via TOPICAL
  Filled 2023-03-06: qty 100

## 2023-03-06 NOTE — TOC Progression Note (Signed)
Transition of Care [facility]) - Progression Note    Patient Details  Name: Jenna Marshall MRN: [id number] Date of Birth: [birth year]  [facility] Care [facility] CM/SW Contact  Truddie Hidden, RN Phone Number: 03/06/2023, 3:53 PM  Clinical Narrative:    Spoke with Raoul Pitch at [facility]. Patient not provided a bed offer from [facility].  Spoke with patient's daughter regarding bed offer for [facility]. She would like time to think SNF. [facility] may decide to take patient home and care for her.     Expected Discharge Plan: Skilled Nursing Facility Barriers to Discharge: Continued Medical Work up  Expected Discharge Plan and Services     Post Acute Care Choice: Skilled Nursing Facility Living arrangements for the past 2 months: Single Family Home                                       Social Determinants of Health (SDOH) Interventions SDOH Screenings   Food Insecurity: No Food Insecurity (02/23/2023)  Housing: Low Risk  (02/23/2023)  Transportation Needs: No Transportation Needs (02/23/2023)  Utilities: Not At Risk (01/01/2023)  Alcohol Screen: Low Risk  (01/01/2023)  Depression (PHQ2-9): Low Risk  (02/24/2023)  Financial Resource Strain: Medium Risk (02/23/2023)  Physical Activity: Unknown (02/23/2023)  Recent Concern: Physical Activity - Inactive (01/01/2023)  Social Connections: Moderately Isolated (02/23/2023)  Stress: Stress Concern Present (02/23/2023)  Tobacco Use: Low Risk  (03/01/2023)  Health Literacy: Inadequate Health Literacy (01/01/2023)    Readmission Risk Interventions    03/02/2023    3:03 PM  Readmission Risk Prevention Plan  Transportation Screening Complete  PCP or Specialist Appt within 5-7 Days Complete  Home Care Screening Complete  Medication Review (RN CM) Complete

## 2023-03-06 NOTE — Progress Notes (Signed)
Physical Therapy Treatment Patient Details Name: Jenna Marshall MRN: [id number] DOB: 12/25/1925 Today's Date: 03/06/2023   History of Present Illness Pt is a 89 y.o. female presenting to hospital 03/01/23 with c/o weakness and SOB; recent known exposure to COVID-positive person.  Pt admitted with cough, PNA d/t COVID-19 virus, sepsis.  PMH includes CAD, htn, DVT, anemia, macular degeneration, L hip hemiarthroplasty.    PT Comments  Pt seen this pm for increased mobility and strengthening with good tolerance. Pt able to complete gait training in room with RW 35+ ft with CG/S. Good safety awareness, no SOB. Pt currently struggles with sit to stand transfers due chronic B knee stiffness and arthritic pain requiring ModA to raise up from chair. Overall, very motivated and cooperative throughout session. Will continue to benefit from acute PT services.   If plan is discharge home, recommend the following: A little help with walking and/or transfers;A little help with bathing/dressing/bathroom;Assistance with cooking/housework;Assist for transportation;Help with stairs or ramp for entrance   Can travel by private vehicle     No  Equipment Recommendations  None recommended by PT (Pt has several RW and a Rollator at home)    Recommendations for Other Services       Precautions / Restrictions Precautions Precautions: Fall Restrictions Weight Bearing Restrictions: No     Mobility  Bed Mobility               General bed mobility comments: NT in chair pre/post session    Transfers Overall transfer level: Needs assistance Equipment used: Rolling walker (2 wheels) Transfers: Sit to/from Stand Sit to Stand: Mod assist           General transfer comment: ModA to weight shift forward and raise up to standing due to knee discomfort and stiffness    Ambulation/Gait Ambulation/Gait assistance: Contact guard assist, Supervision Gait Distance (Feet): 35 Feet Assistive device:  Rolling walker (2 wheels) Gait Pattern/deviations: Step-through pattern, Decreased step length - right, Decreased step length - left, Trunk flexed Gait velocity: decreased     General Gait Details: steady amb with RW CGA for safety   Stairs             Wheelchair Mobility     Tilt Bed    Modified Rankin (Stroke Patients Only)       Balance Overall balance assessment: Needs assistance Sitting-balance support: Feet supported, Single extremity supported Sitting balance-Leahy Scale: Good     Standing balance support: During functional activity, Reliant on assistive device for balance, Bilateral upper extremity supported Standing balance-Leahy Scale: Fair Standing balance comment: able to maintain static standing balance with RW but requires cuing due to posterior lean                            Cognition Arousal: Alert Behavior During Therapy: WFL for tasks assessed/performed Overall Cognitive Status: Within Functional Limits for tasks assessed                                 General Comments: Pleasant and cooperative for PT session        Exercises General Exercises - Lower Extremity Ankle Circles/Pumps: AROM, Both, 10 reps, Seated Long Arc Quad: AROM, Both, 10 reps, Seated    General Comments General comments (skin integrity, edema, etc.):  (Discussed POC and role of PT, addressed pt's questions and concerns.)  Pertinent Vitals/Pain Pain Assessment Pain Assessment: Faces Faces Pain Scale: Hurts little more Pain Location: Left knee Pain Descriptors / Indicators: Aching, Sore Pain Intervention(s): Limited activity within patient's tolerance, Other (comment) (Applied prescribed anti-inflamatory cream to both knees with good relief)    Home Living                          Prior Function            PT Goals (current goals can now be found in the care plan section) Acute Rehab PT Goals Patient Stated Goal: to  improve strength and mobility Progress towards PT goals: Progressing toward goals    Frequency    Min 1X/week      PT Plan      Co-evaluation              AM-PAC PT "6 Clicks" Mobility   Outcome Measure  Help needed turning from your back to your side while in a flat bed without using bedrails?: A Little Help needed moving from lying on your back to sitting on the side of a flat bed without using bedrails?: A Little Help needed moving to and from a bed to a chair (including a wheelchair)?: A Little Help needed standing up from a chair using your arms (e.g., wheelchair or bedside chair)?: A Little Help needed to walk in hospital room?: A Little Help needed climbing 3-5 steps with a railing? : A Lot 6 Click Score: 17    End of Session Equipment Utilized During Treatment: Gait belt Activity Tolerance: Patient tolerated treatment well Patient left: in chair;with call bell/phone within reach;with chair alarm set Nurse Communication: Mobility status PT Visit Diagnosis: Other abnormalities of gait and mobility (R26.89);Muscle weakness (generalized) (M62.81);History of falling (Z91.81);Pain Pain - Right/Left: Left Pain - part of body: Leg     Time: 1610-9604 PT Time Calculation (min) (ACUTE ONLY): 23 min  Charges:    $Gait Training: 8-22 mins $Therapeutic Exercise: 8-22 mins PT General Charges $$ ACUTE PT VISIT: 1 Visit                    Zadie Cleverly, PTA  Jannet Askew 03/06/2023, 4:25 PM

## 2023-03-06 NOTE — Progress Notes (Signed)
Progress Note   Patient: Jenna Marshall ZOX:[id number] DOB: 12/08/1925 DOA: 03/01/2023     5 DOS: the patient was seen and examined on 03/06/2023   Brief hospital course: Jenna Marshall is a 89 y.o. female with medical history significant for hypothyroidism/ h/o DVT , anemia h/o CAD coming for cough, not feeling well since Thursday. Pt denies any chest pain palpitation or bleeding.  Lives at home and has round the clock care. States she feel today but otherwise is stable and walks with limited difficulty due to her arthritis. Pt is alert oriented in ED, NAD. Pt meets sepsis criteria on presentation.  Labs shows: glucose of 120 and normal electrolytes/ renal function and LFT. Mild TNI at 37 from Covid and sepsis. Lactic 1.1 and cbc shows anemia of 10.6 and thrombocytopenia.  In ED pt was started on CAP regimen admitted to hospitalist service for evaluation of sepsis secondary to COVID viral pneumonia.   Assessment and Plan: Sepsis secondary to COVID viral pneumonia S/p ceftriaxone and azithromycin, discontinued antibiotics, less likely bacterial infection.   WBC count within normal range, procalcitonin negative. Finished Paxlovid total 5-day course Mucinex p.o. twice daily. She is not on supplemental oxygen at this time.   Acute hypoxic respiratory failure due to COVID viral pneumonia Supplemental O2 has been weaned off, currently saturating well on room air Hypoxic respiratory failure resolved   CAD, HTN BP stable. Continued aspirin, statin, Coreg, losartan Continue amlodipine 5 mg p.o. nightly with holding parameters   History of DVT, currently not on DOAC Continue DVT prophylaxis   Iron deficiency anemia, Hb 10.8 stable Continue multivitamin, diet supplements.   Thrombocytopenia, mild, continue to monitor CBC daily Platelet count 121-148   Hypothyroid, continue Synthroid   GERD, continue PPI On Colace, senna, Dulcolax suppository. Continue supportive care. Out of bed to  chair. Fall, aspiration precautions.    Code Status: Limited: Do not attempt resuscitation (DNR) -DNR-LIMITED -Do Not Intubate/DNI  I discussed with patient's daughter who stated that she has DNR form filled. Prior records reviewed, she has DNR. Orders updated.    Subjective: Patient is seen and examined today morning.  She is sitting in the chair, feels better.  Eating fair. Has hard of hearing. Daughter at bedside.  Physical Exam: Vitals:   03/05/23 2131 03/06/23 0506 03/06/23 0820 03/06/23 1217  BP: (!) 133/50 139/73 (!) 167/69 (!) 121/47  Pulse:  72 74 65  Resp:  18 18 18   Temp:  98.5 F (36.9 C) 98.3 F (36.8 C) 98.2 F (36.8 C)  TempSrc:      SpO2:  92% 94% 95%  Weight:      Height:        General - Elderly Caucasian female, sitting, no apparent distress HEENT - PERRLA, EOMI, atraumatic head, hard of hearing Lung - Clear, bibasilar rales, no rhonchi, wheezes. Heart - S1, S2 heard, no murmurs, rubs, trace pedal edema. Abdomen - Soft, non tender nondistended, bowel sounds good Neuro - Alert, awake and oriented x 3, non focal exam. Skin - Warm and dry.  Data Reviewed:      Latest Ref Rng & Units 03/06/2023    6:41 AM 03/05/2023    7:04 AM 03/04/2023    5:37 AM  CBC  WBC 4.0 - 10.5 K/uL 6.7  6.9  5.3   Hemoglobin 12.0 - 15.0 g/dL 81.1  91.4  78.2   Hematocrit 36.0 - 46.0 % 31.7  32.0  33.3   Platelets 150 - 400 K/uL  142  144  147       Latest Ref Rng & Units 03/06/2023    6:41 AM 03/05/2023    7:04 AM 03/04/2023    5:37 AM  BMP  Glucose 70 - 99 mg/dL 96  401  89   BUN 8 - 23 mg/dL 21  20  18    Creatinine 0.44 - 1.00 mg/dL 0.27  2.53  6.64   Sodium 135 - 145 mmol/L 130  131  133   Potassium 3.5 - 5.1 mmol/L 3.6  3.5  3.5   Chloride 98 - 111 mmol/L 96  100  99   CO2 22 - 32 mmol/L 25  26  26    Calcium 8.9 - 10.3 mg/dL 8.0  7.9  8.1    No results found.   Family Communication: Discussed with patient and her daughter at bedside, they understand and agree. All  questions answereed.    Disposition: Status is: Inpatient Remains inpatient appropriate because: safe disposition plan  Planned Discharge Destination: Skilled nursing facility     Time spent: 40 minutes  Author: Marcelino Duster, MD 03/06/2023 2:59 PM Secure chat 7am to 7pm For on call review [email or web address].

## 2023-03-06 NOTE — Progress Notes (Signed)
Occupational Therapy Treatment Patient Details Name: Jenna Marshall MRN: [id number] DOB: 24-May-1926 Today's Date: 03/06/2023   History of present illness Pt is a 89 y.o. female presenting to hospital 03/01/23 with c/o weakness and SOB; recent known exposure to COVID-positive person.  Pt admitted with cough, PNA d/t COVID-19 virus, sepsis.  PMH includes CAD, htn, DVT, anemia, macular degeneration, L hip hemiarthroplasty.   OT comments  Jenna Marshall was seen for OT treatment on this date. Upon arrival to room pt reclined in bed, agreeable to tx. Pt requires MOD A + RW sit>stand from bed, CGA for ADL t/f ~5 ft x ~10 ft. Improves to MOD A sit<>stand from [facility] and chair height. Pt making good progress toward goals, will continue to follow POC. Discharge recommendation remains appropriate.       If plan is discharge home, recommend the following:  A little help with walking and/or transfers;A little help with bathing/dressing/bathroom;Help with stairs or ramp for entrance   Equipment Recommendations  BSC/3in1    Recommendations for Other Services      Precautions / Restrictions Precautions Precautions: Fall Restrictions Weight Bearing Restrictions: No       Mobility Bed Mobility Overal bed mobility: Needs Assistance Bed Mobility: Supine to Sit     Supine to sit: Supervision, HOB elevated, Used rails          Transfers Overall transfer level: Needs assistance Equipment used: Rolling walker (2 wheels) Transfers: Sit to/from Stand Sit to Stand: Mod assist           General transfer comment: MOD A standing from bed improves to MIN A standing from [facility]     Balance Overall balance assessment: Needs assistance Sitting-balance support: Feet supported, Single extremity supported Sitting balance-Leahy Scale: Good     Standing balance support: During functional activity, Reliant on assistive device for balance, Bilateral upper extremity supported Standing balance-Leahy Scale: Fair                              ADL either performed or assessed with clinical judgement   ADL Overall ADL's : Needs assistance/impaired                                       General ADL Comments: MIN A + RW for toilet t/f. MAX A don socks in sitting      Cognition Arousal: Alert Behavior During Therapy: WFL for tasks assessed/performed Overall Cognitive Status: Within Functional Limits for tasks assessed                                                     Pertinent Vitals/ Pain       Pain Assessment Pain Assessment: Faces Faces Pain Scale: Hurts little more Pain Location: L leg Pain Descriptors / Indicators: Aching, Sore Pain Intervention(s): Limited activity within patient's tolerance, Repositioned   Frequency  Min 1X/week        Progress Toward Goals  OT Goals(current goals can now be found in the care plan section)  Progress towards OT goals: Progressing toward goals  Acute Rehab OT Goals Patient Stated Goal: to go home OT Goal Formulation: With patient/family Time For Goal Achievement: 03/16/23 Potential to Achieve  Goals: Good ADL Goals Pt Will Perform Grooming: with modified independence;standing Pt Will Perform Lower Body Dressing: with modified independence;sitting/lateral leans;sit to/from stand Pt Will Transfer to Toilet: with modified independence;ambulating;regular height toilet Pt Will Perform Toileting - Clothing Manipulation and hygiene: with modified independence;sitting/lateral leans  Plan      Co-evaluation                 AM-PAC OT "6 Clicks" Daily Activity     Outcome Measure   Help from another person eating meals?: None Help from another person taking care of personal grooming?: A Little Help from another person toileting, which includes using toliet, bedpan, or urinal?: A Little Help from another person bathing (including washing, rinsing, drying)?: A Lot Help from another person to  put on and taking off regular upper body clothing?: A Little Help from another person to put on and taking off regular lower body clothing?: A Lot 6 Click Score: 17    End of Session Equipment Utilized During Treatment: Rolling walker (2 wheels)  OT Visit Diagnosis: Other abnormalities of gait and mobility (R26.89);Muscle weakness (generalized) (M62.81)   Activity Tolerance Patient tolerated treatment well   Patient Left in chair;with call bell/phone within reach;with chair alarm set;with family/visitor present   Nurse Communication          Time: 1610-9604 OT Time Calculation (min): 18 min  Charges: OT General Charges $OT Visit: 1 Visit OT Treatments $Self Care/Home Management : 8-22 mins  Kathie Dike, M.S. OTR/L  03/06/23, 12:34 PM  ascom [phone]

## 2023-03-07 DIAGNOSIS — U071 COVID-19: Secondary | ICD-10-CM | POA: Diagnosis not present

## 2023-03-07 DIAGNOSIS — I10 Essential (primary) hypertension: Secondary | ICD-10-CM | POA: Diagnosis not present

## 2023-03-07 DIAGNOSIS — I2510 Atherosclerotic heart disease of native coronary artery without angina pectoris: Secondary | ICD-10-CM | POA: Diagnosis not present

## 2023-03-07 DIAGNOSIS — J189 Pneumonia, unspecified organism: Secondary | ICD-10-CM | POA: Diagnosis not present

## 2023-03-07 LAB — BASIC METABOLIC PANEL
Anion gap: 8 (ref 5–15)
BUN: 19 mg/dL (ref 8–23)
CO2: 27 mmol/L (ref 22–32)
Calcium: 8.2 mg/dL — ABNORMAL LOW (ref 8.9–10.3)
Chloride: 98 mmol/L (ref 98–111)
Creatinine, Ser: 0.69 mg/dL (ref 0.44–1.00)
GFR, Estimated: >60 mL/min (ref >60)
Glucose, Bld: 100 mg/dL — ABNORMAL HIGH (ref 70–99)
Potassium: 3.7 mmol/L (ref 3.5–5.1)
Sodium: 133 mmol/L — ABNORMAL LOW (ref 135–145)

## 2023-03-07 LAB — CBC
HCT: 30.3 % — ABNORMAL LOW (ref 36.0–46.0)
Hemoglobin: 10.6 g/dL — ABNORMAL LOW (ref 12.0–15.0)
MCH: 31.6 pg (ref 26.0–34.0)
MCHC: 35 g/dL (ref 30.0–36.0)
MCV: 90.4 fL (ref 80.0–100.0)
Platelets: 165 10*3/uL (ref 150–400)
RBC: 3.35 MIL/uL — ABNORMAL LOW (ref 3.87–5.11)
RDW: 14.3 % (ref 11.5–15.5)
WBC: 5.8 10*3/uL (ref 4.0–10.5)
nRBC: 0 % (ref 0.0–0.2)

## 2023-03-07 MED ORDER — BISACODYL 10 MG RE SUPP
10.0000 mg | Freq: Once | RECTAL | Status: AC
  Administered 2023-03-07: 10 mg via RECTAL
  Filled 2023-03-07: qty 1

## 2023-03-07 NOTE — Progress Notes (Signed)
Mobility Specialist - Progress Note     03/07/23 1608  Mobility  Activity Ambulated with assistance in room  Level of Assistance Minimal assist, patient does 75% or more  Assistive Device Front wheel walker  Distance Ambulated (ft) 4 ft  Range of Motion/Exercises Active  Activity Response Tolerated well  Mobility Referral Yes  $Mobility charge 1 Mobility  Mobility Specialist Start Time (ACUTE ONLY) 1538  Mobility Specialist Stop Time (ACUTE ONLY) 1609  Mobility Specialist Time Calculation (min) (ACUTE ONLY) 31 min   Pt resting bed on RA upon entry. Pt STS and ambulates to window in room forward and backward steps MinA with RW. Pt has frequency peeing. Pt returned to bed and left with needs in reach. Bed alarm activated.   Jenna Marshall Mobility Specialist 03/07/23, 4:29 PM

## 2023-03-07 NOTE — TOC Progression Note (Signed)
Transition of Care [name]) - Progression Note    Patient Details  Name: Jenna Marshall MRN: [id number] Date of Birth: 08-Oct-1925  [facility]) CM/SW Contact  Truddie Hidden, RN Phone Number: 03/07/2023, 11:10 AM  Clinical Narrative:    Attempt to reach Tiffany at [facility] [employer]. No answer. Left a message regarding status of referral sent.    Expected Discharge Plan: Skilled Nursing Facility Barriers to Discharge: Continued Medical Work up  Expected Discharge Plan and Services     Post Acute Care Choice: Skilled Nursing Facility Living arrangements for the past 2 months: Single Family Home                                       Social Determinants of Health (SDOH) Interventions SDOH Screenings   Food Insecurity: No Food Insecurity (02/23/2023)  Housing: Low Risk  (02/23/2023)  Transportation Needs: No Transportation Needs (02/23/2023)  Utilities: Not At Risk (01/01/2023)  Alcohol Screen: Low Risk  (01/01/2023)  Depression (PHQ2-9): Low Risk  (02/24/2023)  Financial Resource Strain: Medium Risk (02/23/2023)  Physical Activity: Unknown (02/23/2023)  Recent Concern: Physical Activity - Inactive (01/01/2023)  Social Connections: Moderately Isolated (02/23/2023)  Stress: Stress Concern Present (02/23/2023)  Tobacco Use: Low Risk  (03/01/2023)  Health Literacy: Inadequate Health Literacy (01/01/2023)    Readmission Risk Interventions    03/02/2023    3:03 PM  Readmission Risk Prevention Plan  Transportation Screening Complete  PCP or Specialist Appt within 5-7 Days Complete  Home Care Screening Complete  Medication Review (RN CM) Complete

## 2023-03-07 NOTE — Progress Notes (Signed)
Removed external catheter, patient able to stand and use bsc

## 2023-03-07 NOTE — Progress Notes (Signed)
Progress Note   Patient: Jenna Marshall HYQ:[id number] DOB: [birth year] DOA: 03/01/2023     6 DOS: the patient was seen and examined on 03/07/2023   Brief hospital course: Jenna Marshall is a 89 y.o. female with medical history significant for hypothyroidism/ h/o DVT , anemia h/o CAD coming for cough, not feeling well since Thursday. Pt denies any chest pain palpitation or bleeding.  Lives at home and has round the clock care. States she feel today but otherwise is stable and walks with limited difficulty due to her arthritis. Pt is alert oriented in ED, NAD. Pt meets sepsis criteria on presentation.  Labs shows: glucose of 120 and normal electrolytes/ renal function and LFT. Mild TNI at 37 from Covid and sepsis. Lactic 1.1 and cbc shows anemia of 10.6 and thrombocytopenia.  In ED pt was started on CAP regimen admitted to hospitalist service for evaluation of sepsis secondary to COVID viral pneumonia.  Bacterial pneumonia unlikely, antibiotics stopped.  Continued on supportive care, isolation precautions.  Her oxygen is weaned off.  She worked with PT OT recommended SNF.  She is awaiting SNF placement.   Assessment and Plan: Sepsis secondary to COVID viral pneumonia S/p ceftriaxone and azithromycin, discontinued antibiotics, less likely bacterial infection.   WBC count within normal range, procalcitonin negative. Finished Paxlovid total 5-day course Mucinex p.o. twice daily. She is not on supplemental oxygen at this time.   Acute hypoxic respiratory failure due to COVID viral pneumonia Supplemental O2 has been weaned off, currently saturating well on room air Hypoxic respiratory failure resolved   CAD, HTN BP stable. Continued aspirin, statin, Coreg, losartan Continue amlodipine 5 mg p.o. nightly with holding parameters   History of DVT, currently not on DOAC Continue DVT prophylaxis   Iron deficiency anemia, Hb 10.8 stable Continue multivitamin, diet supplements.    Thrombocytopenia, mild, continue to monitor CBC daily Platelet count improved to 165.   Hypothyroid, continue Synthroid   GERD, continue PPI On Colace, senna, Dulcolax suppository. Continue supportive care. Out of bed to chair. Fall, aspiration precautions.    Code Status: Limited: Do not attempt resuscitation (DNR) -DNR-LIMITED -Do Not Intubate/DNI      Subjective: Patient is seen and examined today morning.  She is sitting in the chair, has constipation.  Eating fair.  No overnight issues.  No family at bedside.  Physical Exam: Vitals:   03/07/23 0403 03/07/23 0648 03/07/23 0820 03/07/23 1233  BP: (!) 173/66 (!) 161/59 (!) 151/55 (!) 137/52  Pulse: 72 68 75 78  Resp: 18  16   Temp: 97.9 F (36.6 C)  98.5 F (36.9 C) 98.5 F (36.9 C)  TempSrc:    Oral  SpO2: 97%  94% 95%  Weight:      Height:        General - Elderly Caucasian female, sitting, no apparent distress HEENT - PERRLA, EOMI, atraumatic head, hard of hearing Lung - Clear, bibasilar rales, no rhonchi, wheezes. Heart - S1, S2 heard, no murmurs, rubs, trace pedal edema. Abdomen - Soft, non tender, distended, bowel sounds good Neuro - Alert, awake and oriented, non focal exam. Skin - Warm and dry.  Data Reviewed:      Latest Ref Rng & Units 03/07/2023    6:19 AM 03/06/2023    6:41 AM 03/05/2023    7:04 AM  CBC  WBC 4.0 - 10.5 K/uL 5.8  6.7  6.9   Hemoglobin 12.0 - 15.0 g/dL 95.2  84.1  32.4   Hematocrit  36.0 - 46.0 % 30.3  31.7  32.0   Platelets 150 - 400 K/uL 165  142  144       Latest Ref Rng & Units 03/07/2023    6:19 AM 03/06/2023    6:41 AM 03/05/2023    7:04 AM  BMP  Glucose 70 - 99 mg/dL 161  96  096   BUN 8 - 23 mg/dL 19  21  20    Creatinine 0.44 - 1.00 mg/dL 0.45  4.09  8.11   Sodium 135 - 145 mmol/L 133  130  131   Potassium 3.5 - 5.1 mmol/L 3.7  3.6  3.5   Chloride 98 - 111 mmol/L 98  96  100   CO2 22 - 32 mmol/L 27  25  26    Calcium 8.9 - 10.3 mg/dL 8.2  8.0  7.9    No results  found.   Family Communication: Discussed with patient she is awaiting placement.   Disposition: Status is: Inpatient Remains inpatient appropriate because: safe disposition plan  Planned Discharge Destination: Skilled nursing facility     Time spent: 36 minutes  Author: Marcelino Duster, MD 03/07/2023 3:20 PM Secure chat 7am to 7pm For on call review [email or web address].

## 2023-03-07 NOTE — Plan of Care (Signed)
  Problem: Education: Goal: Knowledge of General Education information will improve Description: Including pain rating scale, medication(s)/side effects and non-pharmacologic comfort measures Outcome: Progressing   Problem: Health Behavior/Discharge Planning: Goal: Ability to manage health-related needs will improve Outcome: Progressing   Problem: Clinical Measurements: Goal: Ability to maintain clinical measurements within normal limits will improve Outcome: Progressing Goal: Will remain free from infection Outcome: Progressing Goal: Diagnostic test results will improve Outcome: Progressing Goal: Respiratory complications will improve Outcome: Progressing Goal: Cardiovascular complication will be avoided Outcome: Progressing   Problem: Coping: Goal: Level of anxiety will decrease Outcome: Progressing   Problem: Elimination: Goal: Will not experience complications related to bowel motility Outcome: Progressing Goal: Will not experience complications related to urinary retention Outcome: Progressing   Problem: Pain Managment: Goal: General experience of comfort will improve Outcome: Progressing   Problem: Safety: Goal: Ability to remain free from injury will improve Outcome: Progressing   

## 2023-03-07 NOTE — Plan of Care (Signed)

## 2023-03-07 NOTE — TOC Progression Note (Addendum)
Transition of Care [facility]) - Progression Note    Patient Details  Name: Jenna Marshall MRN: [id number] Date of Birth: 1925-12-12  Transition of Care [facility] CM/SW Contact  Truddie Hidden, RN Phone Number: 03/07/2023, 3:53 PM  Clinical Narrative:    Spoke with patient's daughter, Lynden Ang . She, patient and her family have decided they will accept the bed offer for [facility]. She has been advised SNF will require auth, ad discharge would likely be Monday pending medical readiness.   [employer] auth approved. Per Gena patient would not be able to admit until 10/9 due to COVID quarantine period.     Expected Discharge Plan: Skilled Nursing Facility Barriers to Discharge: Continued Medical Work up  Expected Discharge Plan and Services     Post Acute Care Choice: Skilled Nursing Facility Living arrangements for the past 2 months: Single Family Home                                       Social Determinants of Health (SDOH) Interventions SDOH Screenings   Food Insecurity: No Food Insecurity (02/23/2023)  Housing: Low Risk  (02/23/2023)  Transportation Needs: No Transportation Needs (02/23/2023)  Utilities: Not At Risk (01/01/2023)  Alcohol Screen: Low Risk  (01/01/2023)  Depression (PHQ2-9): Low Risk  (02/24/2023)  Financial Resource Strain: Medium Risk (02/23/2023)  Physical Activity: Unknown (02/23/2023)  Recent Concern: Physical Activity - Inactive (01/01/2023)  Social Connections: Moderately Isolated (02/23/2023)  Stress: Stress Concern Present (02/23/2023)  Tobacco Use: Low Risk  (03/01/2023)  Health Literacy: Inadequate Health Literacy (01/01/2023)    Readmission Risk Interventions    03/02/2023    3:03 PM  Readmission Risk Prevention Plan  Transportation Screening Complete  PCP or Specialist Appt within 5-7 Days Complete  Home Care Screening Complete  Medication Review (RN CM) Complete

## 2023-03-08 DIAGNOSIS — I2510 Atherosclerotic heart disease of native coronary artery without angina pectoris: Secondary | ICD-10-CM | POA: Diagnosis not present

## 2023-03-08 DIAGNOSIS — J189 Pneumonia, unspecified organism: Secondary | ICD-10-CM | POA: Diagnosis not present

## 2023-03-08 DIAGNOSIS — U071 COVID-19: Secondary | ICD-10-CM | POA: Diagnosis not present

## 2023-03-08 DIAGNOSIS — I10 Essential (primary) hypertension: Secondary | ICD-10-CM | POA: Diagnosis not present

## 2023-03-08 MED ORDER — POLYETHYLENE GLYCOL 3350 17 G PO PACK
17.0000 g | PACK | Freq: Every day | ORAL | Status: DC
  Administered 2023-03-08 – 2023-03-12 (×5): 17 g via ORAL
  Filled 2023-03-08 (×5): qty 1

## 2023-03-08 NOTE — Plan of Care (Signed)
  Problem: Education: Goal: Knowledge of General Education information will improve Description: Including pain rating scale, medication(s)/side effects and non-pharmacologic comfort measures Outcome: Progressing   Problem: Health Behavior/Discharge Planning: Goal: Ability to manage health-related needs will improve Outcome: Progressing   Problem: Clinical Measurements: Goal: Ability to maintain clinical measurements within normal limits will improve Outcome: Progressing Goal: Respiratory complications will improve Outcome: Progressing   Problem: Activity: Goal: Risk for activity intolerance will decrease Outcome: Progressing   Problem: Nutrition: Goal: Adequate nutrition will be maintained Outcome: Progressing   Problem: Safety: Goal: Ability to remain free from injury will improve Outcome: Progressing   Problem: Skin Integrity: Goal: Risk for impaired skin integrity will decrease Outcome: Progressing

## 2023-03-08 NOTE — Progress Notes (Signed)
Progress Note   Patient: Jenna Marshall OZD:[id number] DOB: January 25, 1926 DOA: 03/01/2023     7 DOS: the patient was seen and examined on 03/08/2023   Brief hospital course: Jenna Marshall is a 89 y.o. female with medical history significant for hypothyroidism/ h/o DVT , anemia h/o CAD coming for cough, not feeling well since Thursday. Pt denies any chest pain palpitation or bleeding.  Lives at home and has round the clock care. States she feel today but otherwise is stable and walks with limited difficulty due to her arthritis. Pt is alert oriented in ED, NAD. Pt meets sepsis criteria on presentation.  Labs shows: glucose of 120 and normal electrolytes/ renal function and LFT. Mild TNI at 37 from Covid and sepsis. Lactic 1.1 and cbc shows anemia of 10.6 and thrombocytopenia.  In ED pt was started on CAP regimen admitted to hospitalist service for evaluation of sepsis secondary to COVID viral pneumonia.  Bacterial pneumonia unlikely, antibiotics stopped.  Continued on supportive care, isolation precautions.  Her oxygen is weaned off.  She worked with PT OT recommended SNF.  She is awaiting SNF placement.   Assessment and Plan: Sepsis secondary to COVID viral pneumonia S/p ceftriaxone and azithromycin, discontinued antibiotics, less likely bacterial infection.   WBC count within normal range, procalcitonin negative. Finished Paxlovid total 5-day course Mucinex p.o. twice daily. She is not on supplemental oxygen at this time.   Acute hypoxic respiratory failure due to COVID viral pneumonia Supplemental O2 has been weaned off, currently saturating well on room air Hypoxic respiratory failure resolved   CAD, HTN BP stable. Continued aspirin, statin, Coreg, losartan Continue amlodipine 5 mg p.o. nightly with holding parameters   History of DVT, currently not on DOAC Continue DVT prophylaxis   Iron deficiency anemia, Hb 10.8 stable Continue multivitamin, diet supplements.    Thrombocytopenia, mild, continue to monitor CBC daily Platelet count improved to 165.   Hypothyroid, continue Synthroid   GERD, continue PPI continue Colace, senna, miralax. Continue supportive care. Out of bed to chair. Fall, aspiration precautions.    Code Status: Limited: Do not attempt resuscitation (DNR) -DNR-LIMITED -Do Not Intubate/DNI      Subjective: Patient is seen and examined today morning.  She is sitting in the chair, had a small bowel movement. Admits nausea. Eating very good.  No overnight issues.  Daughter at bedside.  Physical Exam: Vitals:   03/08/23 0003 03/08/23 0422 03/08/23 0836 03/08/23 1255  BP: (!) 149/51 (!) 134/55 (!) 170/68 (!) 147/57  Pulse: 69 68 72 67  Resp: 18 18 20 20   Temp: 98 F (36.7 C) 97.8 F (36.6 C) 98.6 F (37 C) 97.8 F (36.6 C)  TempSrc: Oral Oral    SpO2: 95% 96% 95% 97%  Weight:  72.9 kg    Height:        General - Elderly Caucasian female, sitting, no apparent distress HEENT - PERRLA, EOMI, atraumatic head, hard of hearing Lung - Clear, bibasilar rales, no rhonchi, wheezes. Heart - S1, S2 heard, no murmurs, rubs, trace pedal edema. Abdomen - Soft, non tender, distended, bowel sounds good Neuro - Alert, awake and oriented, non focal exam. Skin - Warm and dry.  Data Reviewed:      Latest Ref Rng & Units 03/07/2023    6:19 AM 03/06/2023    6:41 AM 03/05/2023    7:04 AM  CBC  WBC 4.0 - 10.5 K/uL 5.8  6.7  6.9   Hemoglobin 12.0 - 15.0 g/dL 10.6  10.5  10.8   Hematocrit 36.0 - 46.0 % 30.3  31.7  32.0   Platelets 150 - 400 K/uL 165  142  144       Latest Ref Rng & Units 03/07/2023    6:19 AM 03/06/2023    6:41 AM 03/05/2023    7:04 AM  BMP  Glucose 70 - 99 mg/dL 295  96  621   BUN 8 - 23 mg/dL 19  21  20    Creatinine 0.44 - 1.00 mg/dL 3.08  6.57  8.46   Sodium 135 - 145 mmol/L 133  130  131   Potassium 3.5 - 5.1 mmol/L 3.7  3.6  3.5   Chloride 98 - 111 mmol/L 98  96  100   CO2 22 - 32 mmol/L 27  25  26    Calcium  8.9 - 10.3 mg/dL 8.2  8.0  7.9    No results found.   Family Communication: Discussed with patient she is awaiting placement.   Disposition: Status is: Inpatient Remains inpatient appropriate because: safe disposition plan, awaiting insurance.  Planned Discharge Destination: Skilled nursing facility      Time spent: 37 minutes  Author: Marcelino Duster, MD 03/08/2023 3:20 PM Secure chat 7am to 7pm For on call review [email or web address].

## 2023-03-08 NOTE — Plan of Care (Signed)

## 2023-03-08 NOTE — Progress Notes (Signed)
Mobility Specialist - Progress Note     03/08/23 1407  Mobility  Activity Ambulated with assistance in hallway;Stood at bedside  Level of Assistance Minimal assist, patient does 75% or more  Assistive Device Front wheel walker  Distance Ambulated (ft) 3 ft  Range of Motion/Exercises Active;Left leg;Right leg;Left arm;Right arm  Activity Response Tolerated well  Mobility Referral Yes  $Mobility charge 1 Mobility  Mobility Specialist Start Time (ACUTE ONLY) 1353  Mobility Specialist Stop Time (ACUTE ONLY) 1407  Mobility Specialist Time Calculation (min) (ACUTE ONLY) 14 min   Pt resting in bed on RA upon entry. Pt STS MinA and ambulates to forward two step and back two steps to return to sitting EOB. Pt performs knee press downs 10 seconds x2 LLE/RLE. Pt knee kicks  10x both sides. Pt returned to laying and left with needs in reach. Bed alarm activated.   [name] Mobility Specialist 03/08/23, 2:59 PM

## 2023-03-09 DIAGNOSIS — I10 Essential (primary) hypertension: Secondary | ICD-10-CM | POA: Diagnosis not present

## 2023-03-09 DIAGNOSIS — I2510 Atherosclerotic heart disease of native coronary artery without angina pectoris: Secondary | ICD-10-CM | POA: Diagnosis not present

## 2023-03-09 DIAGNOSIS — J189 Pneumonia, unspecified organism: Secondary | ICD-10-CM | POA: Diagnosis not present

## 2023-03-09 DIAGNOSIS — U071 COVID-19: Secondary | ICD-10-CM | POA: Diagnosis not present

## 2023-03-09 MED ORDER — BISACODYL 10 MG RE SUPP
10.0000 mg | Freq: Once | RECTAL | Status: AC
  Administered 2023-03-09: 10 mg via RECTAL
  Filled 2023-03-09: qty 1

## 2023-03-09 NOTE — Plan of Care (Signed)
  Problem: Education: Goal: Knowledge of General Education information will improve Description: Including pain rating scale, medication(s)/side effects and non-pharmacologic comfort measures Outcome: Progressing   Problem: Health Behavior/Discharge Planning: Goal: Ability to manage health-related needs will improve Outcome: Progressing   Problem: Clinical Measurements: Goal: Ability to maintain clinical measurements within normal limits will improve Outcome: Progressing Goal: Will remain free from infection Outcome: Progressing Goal: Diagnostic test results will improve Outcome: Progressing Goal: Respiratory complications will improve Outcome: Progressing Goal: Cardiovascular complication will be avoided Outcome: Progressing   Problem: Activity: Goal: Risk for activity intolerance will decrease Outcome: Progressing   Problem: Nutrition: Goal: Adequate nutrition will be maintained Outcome: Progressing   Problem: Elimination: Goal: Will not experience complications related to bowel motility Outcome: Progressing   Problem: Safety: Goal: Ability to remain free from injury will improve Outcome: Progressing   Problem: Skin Integrity: Goal: Risk for impaired skin integrity will decrease Outcome: Progressing

## 2023-03-09 NOTE — Plan of Care (Signed)

## 2023-03-09 NOTE — Progress Notes (Signed)
Progress Note   Patient: Jenna Marshall ZOX:[id number] DOB: [birth year] DOA: 03/01/2023     8 DOS: the patient was seen and examined on 03/09/2023   Brief hospital course: Jenna Marshall is a 89 y.o. female with medical history significant for hypothyroidism/ h/o DVT , anemia h/o CAD coming for cough, not feeling well since Thursday. Pt denies any chest pain palpitation or bleeding.  Lives at home and has round the clock care. States she feel today but otherwise is stable and walks with limited difficulty due to her arthritis. Pt is alert oriented in ED, NAD. Pt meets sepsis criteria on presentation.  Labs shows: glucose of 120 and normal electrolytes/ renal function and LFT. Mild TNI at 37 from Covid and sepsis. Lactic 1.1 and cbc shows anemia of 10.6 and thrombocytopenia.  In ED pt was started on CAP regimen admitted to hospitalist service for evaluation of sepsis secondary to COVID viral pneumonia.  Bacterial pneumonia unlikely, antibiotics stopped.  Continued on supportive care, isolation precautions.  Her oxygen is weaned off.  She worked with PT OT recommended SNF.  She is awaiting SNF placement.   Assessment and Plan: Sepsis secondary to COVID viral pneumonia Discontinued ceftriaxone and azithromycin, less likely bacterial infection.   WBC count within normal range, procalcitonin negative. Finished Paxlovid total 5-day course Mucinex p.o. twice daily. She is not on supplemental oxygen.   Acute hypoxic respiratory failure due to COVID viral pneumonia Supplemental O2 has been weaned off, currently saturating well on room air Hypoxic respiratory failure resolved   CAD, HTN BP stable. Continued aspirin, statin, Coreg, losartan Continue amlodipine 5 mg p.o. nightly with holding parameters   History of DVT, currently not on DOAC Continue DVT prophylaxis   Iron deficiency anemia, Hb 10.8 stable Continue multivitamin, diet supplements.   Thrombocytopenia,  Platelet count improved to  165.   Hypothyroid, continue Synthroid   GERD, continue PPI.  Constipation - continue colace, senna, miralax. Enema for severe constipation. Continue supportive care. Out of bed to chair. Fall, aspiration precautions.    Code Status: Limited: Do not attempt resuscitation (DNR) -DNR-LIMITED -Do Not Intubate/DNI      Subjective: Patient is seen and examined today morning.  She is sitting in the chair. Has constipation, denies nausea. Eating fair.  No overnight issues.  Daughter at bedside.  Physical Exam: Vitals:   03/08/23 2230 03/09/23 0400 03/09/23 0836 03/09/23 1132  BP: (!) 160/58 (!) 132/51 (!) 156/64 (!) 138/58  Pulse: 68 69 70 72  Resp: 18 18 16 20   Temp: 98.1 F (36.7 C) 97.9 F (36.6 C) 98.4 F (36.9 C) 98.4 F (36.9 C)  TempSrc: Oral  Oral Oral  SpO2: 94% 93% 93% 96%  Weight:      Height:        General - Elderly Caucasian female, sitting, no apparent distress HEENT - PERRLA, EOMI, atraumatic head, hard of hearing Lung - Clear, bibasilar rales, no rhonchi, wheezes. Heart - S1, S2 heard, no murmurs, rubs, trace pedal edema. Abdomen - Soft, non tender, distended, bowel sounds good Neuro - Alert, awake and oriented, non focal exam. Skin - Warm and dry.  Data Reviewed:      Latest Ref Rng & Units 03/07/2023    6:19 AM 03/06/2023    6:41 AM 03/05/2023    7:04 AM  CBC  WBC 4.0 - 10.5 K/uL 5.8  6.7  6.9   Hemoglobin 12.0 - 15.0 g/dL 81.1  91.4  78.2   Hematocrit 36.0 -  46.0 % 30.3  31.7  32.0   Platelets 150 - 400 K/uL 165  142  144       Latest Ref Rng & Units 03/07/2023    6:19 AM 03/06/2023    6:41 AM 03/05/2023    7:04 AM  BMP  Glucose 70 - 99 mg/dL 562  96  130   BUN 8 - 23 mg/dL 19  21  20    Creatinine 0.44 - 1.00 mg/dL 8.65  7.84  6.96   Sodium 135 - 145 mmol/L 133  130  131   Potassium 3.5 - 5.1 mmol/L 3.7  3.6  3.5   Chloride 98 - 111 mmol/L 98  96  100   CO2 22 - 32 mmol/L 27  25  26    Calcium 8.9 - 10.3 mg/dL 8.2  8.0  7.9    No results  found.   Family Communication: Discussed with patient, daughter at bedside, They understand and agree. She is awaiting placement.   Disposition: Status is: Inpatient Remains inpatient appropriate because: safe disposition plan, awaiting insurance.  Planned Discharge Destination: Skilled nursing facility      Time spent: 36 minutes  Author: Marcelino Duster, MD 03/09/2023 1:42 PM Secure chat 7am to 7pm For on call review [email or web address]

## 2023-03-10 DIAGNOSIS — J189 Pneumonia, unspecified organism: Secondary | ICD-10-CM | POA: Diagnosis not present

## 2023-03-10 DIAGNOSIS — I2510 Atherosclerotic heart disease of native coronary artery without angina pectoris: Secondary | ICD-10-CM | POA: Diagnosis not present

## 2023-03-10 DIAGNOSIS — I10 Essential (primary) hypertension: Secondary | ICD-10-CM | POA: Diagnosis not present

## 2023-03-10 DIAGNOSIS — U071 COVID-19: Secondary | ICD-10-CM | POA: Diagnosis not present

## 2023-03-10 MED ORDER — DICLOFENAC SODIUM 1 % EX GEL: 2.0000 g | Freq: Four times a day (QID) | CUTANEOUS | Status: DC | PRN

## 2023-03-10 NOTE — Progress Notes (Signed)
Progress Note   Patient: Jenna Marshall AOZ:[id number] DOB: 1926/01/10 DOA: 03/01/2023     9 DOS: the patient was seen and examined on 03/10/2023   Brief hospital course: Jenna Marshall is a 89 y.o. female with medical history significant for hypothyroidism/ h/o DVT , anemia h/o CAD coming for cough, not feeling well since Thursday. Pt denies any chest pain palpitation or bleeding.  Lives at home and has round the clock care. States she feel today but otherwise is stable and walks with limited difficulty due to her arthritis. Pt is alert oriented in ED, NAD. Pt meets sepsis criteria on presentation.  Labs shows: glucose of 120 and normal electrolytes/ renal function and LFT. Mild TNI at 37 from Covid and sepsis. Lactic 1.1 and cbc shows anemia of 10.6 and thrombocytopenia.  In ED pt was started on CAP regimen admitted to hospitalist service for evaluation of sepsis secondary to COVID viral pneumonia.  Bacterial pneumonia unlikely, antibiotics stopped.  Continued on supportive care, isolation precautions.  Her oxygen is weaned off.  She worked with PT OT recommended SNF.  [facility] can [facility] 03/[facility] due to COVID isolation precautions.   Assessment and Plan: Sepsis secondary to COVID viral pneumonia Discontinued ceftriaxone and azithromycin, less likely bacterial infection.   WBC count within normal range, procalcitonin negative. Finished Paxlovid total 5-day course Mucinex p.o. twice daily. She is not on supplemental oxygen.   Acute hypoxic respiratory failure due to COVID viral pneumonia Supplemental O2 has been weaned off, currently saturating well on room air Hypoxic respiratory failure resolved   CAD, HTN BP stable. Continued aspirin, statin, Coreg, losartan Continue amlodipine 5 mg p.o. nightly with holding parameters   History of DVT, currently not on DOAC Continue DVT prophylaxis   Iron deficiency anemia, Hb 10.8 stable Continue multivitamin, diet supplements.    Thrombocytopenia,  Platelet count improved to 165.   Hypothyroid, continue Synthroid   GERD, continue PPI.  Constipation - continue colace, senna, miralax. Enema for severe constipation. Continue supportive care. Out of bed to chair. Fall, aspiration precautions.    Code Status: Limited: Do not attempt resuscitation (DNR) -DNR-LIMITED -Do Not Intubate/DNI      Subjective: Patient is seen and examined today morning.  She is lying in bed. Had small bowel movement yesterday. Denies nausea. Eating fair.  No overnight issues.   Physical Exam: Vitals:   03/10/23 0534 03/10/23 0908 03/10/23 1033 03/10/23 1208  BP: (!) 148/55 (!) 133/51  (!) 165/55  Pulse: 68 71  63  Resp: 18 20  18   Temp: 98.1 F (36.7 C) 98 F (36.7 C)  98 F (36.7 C)  TempSrc: Oral     SpO2: 94% 95% 98% 97%  Weight:      Height:        General - Elderly Caucasian female, sitting, no apparent distress HEENT - PERRLA, EOMI, atraumatic head, hard of hearing Lung - Clear, bibasilar rales, no rhonchi, wheezes. Heart - S1, S2 heard, no murmurs, rubs, trace pedal edema. Abdomen - Soft, non tender, distended, bowel sounds good Neuro - Alert, awake and oriented, non focal exam. Skin - Warm and dry.  Data Reviewed:      Latest Ref Rng & Units 03/07/2023    6:19 AM 03/06/2023    6:41 AM 03/05/2023    7:04 AM  CBC  WBC 4.0 - 10.5 K/uL 5.8  6.7  6.9   Hemoglobin 12.0 - 15.0 g/dL 96.2  95.2  84.1   Hematocrit  36.0 - 46.0 % 30.3  31.7  32.0   Platelets 150 - 400 K/uL 165  142  144       Latest Ref Rng & Units 03/07/2023    6:19 AM 03/06/2023    6:41 AM 03/05/2023    7:04 AM  BMP  Glucose 70 - 99 mg/dL 161  96  096   BUN 8 - 23 mg/dL 19  21  20    Creatinine 0.44 - 1.00 mg/dL 0.45  4.09  8.11   Sodium 135 - 145 mmol/L 133  130  131   Potassium 3.5 - 5.1 mmol/L 3.7  3.6  3.5   Chloride 98 - 111 mmol/L 98  96  100   CO2 22 - 32 mmol/L 27  25  26    Calcium 8.9 - 10.3 mg/dL 8.2  8.0  7.9    No results  found.   Family Communication: Discussed with patient, she is awaiting placement.   Disposition: Status is: Inpatient Remains inpatient appropriate because: safe disposition plan  Planned Discharge Destination: Skilled nursing facility 03/12/23     Time spent: 36 minutes  Author: Marcelino Duster, MD 03/10/2023 4:32 PM Secure chat 7am to 7pm For on call review [email or web address]

## 2023-03-10 NOTE — TOC Progression Note (Signed)
Transition of Care [facility]) - Progression Note    Patient Details  Name: Jenna Marshall MRN: [id number] Date of Birth: 1926/05/08  Transition of Care [facility]) CM/SW Contact  Darolyn Rua, [place] Phone Number: 03/10/2023, 9:26 AM  Clinical Narrative:     CSW has reached out to Gena with [facility] to confirm patient cannot come until 10/9 Wednesday due to Covid + status, she confirms this is correct. Will admit to [facility] Wednesday 10/9 if medically cleared.   TOC will continue to follow.   Expected Discharge Plan: Skilled Nursing Facility Barriers to Discharge: Continued Medical Work up  Expected Discharge Plan and Services     Post Acute Care Choice: Skilled Nursing Facility Living arrangements for the past 2 months: Single Family Home                                       Social Determinants of Health (SDOH) Interventions SDOH Screenings   Food Insecurity: No Food Insecurity (02/23/2023)  Housing: Low Risk  (02/23/2023)  Transportation Needs: No Transportation Needs (02/23/2023)  Utilities: Not At Risk (01/01/2023)  Alcohol Screen: Low Risk  (01/01/2023)  Depression (PHQ2-9): Low Risk  (02/24/2023)  Financial Resource Strain: Medium Risk (02/23/2023)  Physical Activity: Unknown (02/23/2023)  Recent Concern: Physical Activity - Inactive (01/01/2023)  Social Connections: Moderately Isolated (02/23/2023)  Stress: Stress Concern Present (02/23/2023)  Tobacco Use: Low Risk  (03/01/2023)  Health Literacy: Inadequate Health Literacy (01/01/2023)    Readmission Risk Interventions    03/02/2023    3:03 PM  Readmission Risk Prevention Plan  Transportation Screening Complete  PCP or Specialist Appt within 5-7 Days Complete  Home Care Screening Complete  Medication Review (RN CM) Complete

## 2023-03-10 NOTE — Care Management Important Message (Signed)
Important Message  Patient Details  Name: Jenna Marshall MRN: [id number] Date of Birth: 08/03/1925   Important Message Given:  Yes - Medicare IM  I reviewed the Important Message from Medicare with the patient's legal guardian, Graylin Shiver by phone [phone]) and she stated she understood these rights. I thanked her for her time and wished her a good afternoon.    Olegario Messier A Charlsie Fleeger 03/10/2023, 2:00 PM

## 2023-03-10 NOTE — Consult Note (Addendum)
[facility] Customer service manager [facility]) Accountable Care Organization (ACO) [facility] Liaison Note  03/10/2023  Jenna Marshall 1925/10/16 [id number]  Location: [facility] RN Hospital Liaison screened the patient remotely at [facility].  Insurance: [facility] HMO   Jenna Marshall is a 89 y.o. female who is a Primary Case Patient of Sherlene Shams, MD. The patient was screened for  readmission hospitalization with noted low risk score for unplanned readmission risk with 1 IP in 6 months.  The patient was assessed for potential [facility]) Care Management service needs for post hospital transition for care coordination. Review of patient's electronic medical record reveals patient was admitted for Pneumonia due to COVID. TOC documentation indicate pt will discharged to [facility] on Wed 10/9 with this her recent diagnosis (COVID).  Facility will address pt's ongoing needs.   Plan: [name] Liaison will continue to follow progress and disposition to asess for post hospital community care coordination/management needs.  Referral request for community care coordination: pending disposition.   [facility] Care Management/Population Health does not replace or interfere with any arrangements made by the Inpatient Transition of Care team.   For questions contact:   Elliot Cousin, RN, [facility] Liaison [place]   Population Health Office Hours MTWF  8:00 am-6:00 pm [phone] mobile [phone] [Office toll free line] Office Hours are M-F 8:30 - 5 pm [name]

## 2023-03-10 NOTE — Progress Notes (Signed)
Occupational Therapy Treatment Patient Details Name: Jenna Marshall MRN: [id number] DOB: 09-24-1925 Today's Date: 03/10/2023   History of present illness Pt is a 89 y.o. female presenting to hospital 03/01/23 with c/o weakness and SOB; recent known exposure to COVID-positive person.  Pt admitted with cough, PNA d/t COVID-19 virus, sepsis.  PMH includes CAD, htn, DVT, anemia, macular degeneration, L hip hemiarthroplasty.   OT comments  Jenna Marshall was seen for OT treatment on this date. Upon arrival to room pt seated in chair, agreeable to tx. Pt requires CGA + RW for sit<>stand x3 and chair>bed t/f. CGA pericare standing. MOD A doff underwear in standing. Pt states burning with urination, RN notified and in during session. Pt making good progress toward goals, will continue to follow POC. Discharge recommendation remains appropriate.      If plan is discharge home, recommend the following:  A little help with walking and/or transfers;A little help with bathing/dressing/bathroom;Help with stairs or ramp for entrance   Equipment Recommendations  BSC/3in1    Recommendations for Other Services      Precautions / Restrictions Precautions Precautions: Fall Restrictions Weight Bearing Restrictions: No       Mobility Bed Mobility Overal bed mobility: Needs Assistance Bed Mobility: Sit to Supine       Sit to supine: Contact guard assist        Transfers Overall transfer level: Needs assistance Equipment used: Rolling walker (2 wheels) Transfers: Sit to/from Stand Sit to Stand: Contact guard assist                 Balance Overall balance assessment: Needs assistance Sitting-balance support: Feet supported Sitting balance-Leahy Scale: Good     Standing balance support: Single extremity supported, During functional activity Standing balance-Leahy Scale: Fair                             ADL either performed or assessed with clinical judgement   ADL Overall  ADL's : Needs assistance/impaired                                       General ADL Comments: CGA + RW for simulated BSC t/f. CGA pericare standing. MOD A doff underwear.      Cognition Arousal: Alert Behavior During Therapy: WFL for tasks assessed/performed Overall Cognitive Status: Within Functional Limits for tasks assessed                                                     Pertinent Vitals/ Pain       Pain Assessment Pain Assessment: Faces Faces Pain Scale: Hurts little more Pain Location: vagina Pain Descriptors / Indicators: Burning Pain Intervention(s): Limited activity within patient's tolerance, Repositioned, Other (comment) (RN gave powder)   Frequency  Min 1X/week        Progress Toward Goals  OT Goals(current goals can now be found in the care plan section)  Progress towards OT goals: Progressing toward goals  Acute Rehab OT Goals Patient Stated Goal: to go home OT Goal Formulation: With patient/family Time For Goal Achievement: 03/16/23 Potential to Achieve Goals: Good ADL Goals Pt Will Perform Grooming: with modified independence;standing Pt Will Perform Lower Body Dressing: with  modified independence;sitting/lateral leans;sit to/from stand Pt Will Transfer to Toilet: with modified independence;ambulating;regular height toilet Pt Will Perform Toileting - Clothing Manipulation and hygiene: with modified independence;sitting/lateral leans  Plan      Co-evaluation                 AM-PAC OT "6 Clicks" Daily Activity     Outcome Measure   Help from another person eating meals?: None Help from another person taking care of personal grooming?: A Little Help from another person toileting, which includes using toliet, bedpan, or urinal?: A Little Help from another person bathing (including washing, rinsing, drying)?: A Lot Help from another person to put on and taking off regular upper body clothing?: A  Little Help from another person to put on and taking off regular lower body clothing?: A Lot 6 Click Score: 17    End of Session    OT Visit Diagnosis: Other abnormalities of gait and mobility (R26.89);Muscle weakness (generalized) (M62.81)   Activity Tolerance Patient tolerated treatment well   Patient Left in bed;with call bell/phone within reach;with nursing/sitter in room   Nurse Communication          Time: 9147-8295 OT Time Calculation (min): 15 min  Charges: OT General Charges $OT Visit: 1 Visit OT Treatments $Self Care/Home Management : 8-22 mins  Kathie Dike, M.S. OTR/L  03/10/23, 2:56 PM  ascom [phone]

## 2023-03-10 NOTE — Progress Notes (Signed)
Physical Therapy Treatment Patient Details Name: Jenna Marshall MRN: [id number] DOB: 1925-11-07 Today's Date: 03/10/2023   History of Present Illness Pt is a 89 y.o. female presenting to hospital 03/01/23 with c/o weakness and SOB; recent known exposure to COVID-positive person.  Pt admitted with cough, PNA d/t COVID-19 virus, sepsis.  PMH includes CAD, htn, DVT, anemia, macular degeneration, L hip hemiarthroplasty.    PT Comments  Pt is received in recliner, she is agreeable to PT session. Pt endorses mild 1/10 NPS L knee pain at beginning of session but reports "it's probably because I'm sitting for too long". Pt performs transfers and amb CGA for safety. Pt able to amb approx 80 ft using RW with occasional cuing to increase stride length and maintaining upright posture. Overall, Pt demonstrates progression towards PT goals during today's session as seen by decreased assist required and increased activity tolerance. Pt would benefit from cont skilled PT to address above deficits and promote optimal return to PLOF.    If plan is discharge home, recommend the following: A little help with walking and/or transfers;A little help with bathing/dressing/bathroom;Assistance with cooking/housework;Assist for transportation;Help with stairs or ramp for entrance   Can travel by private vehicle     No  Equipment Recommendations  Other (comment) (TBD at next facility)    Recommendations for Other Services       Precautions / Restrictions Precautions Precautions: Fall Restrictions Weight Bearing Restrictions: No     Mobility  Bed Mobility               General bed mobility comments: NT in chair pre/post session    Transfers Overall transfer level: Needs assistance Equipment used: Rolling walker (2 wheels) Transfers: Sit to/from Stand Sit to Stand: Contact guard assist           General transfer comment: cues to scoot edge of seat to assist in STS     Ambulation/Gait Ambulation/Gait assistance: Contact guard assist, Supervision Gait Distance (Feet): 80 Feet Assistive device: Rolling walker (2 wheels) Gait Pattern/deviations: Step-through pattern, Decreased step length - right, Decreased step length - left, Trunk flexed Gait velocity: decreased     General Gait Details: steady amb with RW CGA for safety; slight cuing to increase stride length   Stairs             Wheelchair Mobility     Tilt Bed    Modified Rankin (Stroke Patients Only)       Balance Overall balance assessment: Needs assistance Sitting-balance support: Feet supported Sitting balance-Leahy Scale: Good Sitting balance - Comments: able to maintain seated EOB balance without BUE support; no LOB noted   Standing balance support: During functional activity, Reliant on assistive device for balance, Bilateral upper extremity supported   Standing balance comment: able to maintain static standing balance with RW during functional activities                            Cognition Arousal: Alert Behavior During Therapy: WFL for tasks assessed/performed Overall Cognitive Status: Within Functional Limits for tasks assessed                                 General Comments: Pleasant and cooperative for PT session        Exercises      General Comments        Pertinent Vitals/Pain Pain Assessment  Pain Assessment: 0-10 Pain Score: 1  Pain Location: Left knee Pain Descriptors / Indicators: Aching, Sore Pain Intervention(s): Monitored during session    Home Living                          Prior Function            PT Goals (current goals can now be found in the care plan section) Acute Rehab PT Goals Patient Stated Goal: to improve strength and mobility PT Goal Formulation: With patient Time For Goal Achievement: 03/16/23 Potential to Achieve Goals: Good Progress towards PT goals: Progressing toward  goals    Frequency    Min 1X/week      PT Plan      Co-evaluation              AM-PAC PT "6 Clicks" Mobility   Outcome Measure  Help needed turning from your back to your side while in a flat bed without using bedrails?: A Little Help needed moving from lying on your back to sitting on the side of a flat bed without using bedrails?: A Little Help needed moving to and from a bed to a chair (including a wheelchair)?: A Little Help needed standing up from a chair using your arms (e.g., wheelchair or bedside chair)?: A Little Help needed to walk in hospital room?: A Little Help needed climbing 3-5 steps with a railing? : A Lot 6 Click Score: 17    End of Session   Activity Tolerance: Patient tolerated treatment well Patient left: in chair;with call bell/phone within reach;with chair alarm set Nurse Communication: Mobility status PT Visit Diagnosis: Other abnormalities of gait and mobility (R26.89);Muscle weakness (generalized) (M62.81);History of falling (Z91.81);Pain Pain - Right/Left: Left Pain - part of body: Knee     Time: 1010-1030 PT Time Calculation (min) (ACUTE ONLY): 20 min  Charges:                            Elmon Else, SPT    Callum Wolf 03/10/2023, 10:38 AM

## 2023-03-11 DIAGNOSIS — U071 COVID-19: Secondary | ICD-10-CM | POA: Diagnosis not present

## 2023-03-11 DIAGNOSIS — J189 Pneumonia, unspecified organism: Secondary | ICD-10-CM | POA: Diagnosis not present

## 2023-03-11 DIAGNOSIS — I10 Essential (primary) hypertension: Secondary | ICD-10-CM | POA: Diagnosis not present

## 2023-03-11 DIAGNOSIS — I2510 Atherosclerotic heart disease of native coronary artery without angina pectoris: Secondary | ICD-10-CM | POA: Diagnosis not present

## 2023-03-11 LAB — CBC
HCT: 31.6 % — ABNORMAL LOW (ref 36.0–46.0)
Hemoglobin: 10.8 g/dL — ABNORMAL LOW (ref 12.0–15.0)
MCH: 30.7 pg (ref 26.0–34.0)
MCHC: 34.2 g/dL (ref 30.0–36.0)
MCV: 89.8 fL (ref 80.0–100.0)
Platelets: 237 10*3/uL (ref 150–400)
RBC: 3.52 MIL/uL — ABNORMAL LOW (ref 3.87–5.11)
RDW: 14 % (ref 11.5–15.5)
WBC: 7.7 10*3/uL (ref 4.0–10.5)
nRBC: 0 % (ref 0.0–0.2)

## 2023-03-11 MED ORDER — AMLODIPINE BESYLATE 10 MG PO TABS
10.0000 mg | ORAL_TABLET | Freq: Every day | ORAL | Status: DC
  Administered 2023-03-11: 10 mg via ORAL
  Filled 2023-03-11: qty 1

## 2023-03-11 NOTE — Progress Notes (Signed)
Occupational Therapy Treatment Patient Details Name: Jenna Marshall MRN: [id number] DOB: 09-14-1925 Today's Date: 03/11/2023   History of present illness Pt is a 89 y.o. female presenting to hospital 03/01/23 with c/o weakness and SOB; recent known exposure to COVID-positive person.  Pt admitted with cough, PNA d/t COVID-19 virus, sepsis.  PMH includes CAD, htn, DVT, anemia, macular degeneration, L hip hemiarthroplasty.   OT comments  Pt. Was finishing morning care, and transfers to the recliner upon arrival. Pt. Requires mod-maxA LE  ADLs 2/2 LE stiffness. Pt. Reports having and using a reacher, and sockaide  at home for years following hip surgery. Reviewed with Pt. PLOF, daily home routines, assistance, and anticipated discharge needs. Pt. Education was provided about energy conservation techniques. Pt. was provided with a visual handout. Pt.'s SpO2 97% on RA. Pt. Continues to benefit from OT services for ADL training, A/E training, and pt. Education about home modification, and DME.       If plan is discharge home, recommend the following:  A little help with walking and/or transfers;A little help with bathing/dressing/bathroom;Help with stairs or ramp for entrance   Equipment Recommendations       Recommendations for Other Services      Precautions / Restrictions Precautions Precautions: Fall Restrictions Weight Bearing Restrictions: No       Mobility Bed Mobility               General bed mobility comments: Pt. up in recliner chair    Transfers                         Balance                                           ADL either performed or assessed with clinical judgement   ADL                                         General ADL Comments: Mod-maxA LE dressing.    Extremity/Trunk Assessment Upper Extremity Assessment Upper Extremity Assessment: Generalized weakness            Vision       Perception      Praxis      Cognition Arousal: Alert Behavior During Therapy: WFL for tasks assessed/performed Overall Cognitive Status: Within Functional Limits for tasks assessed                                 General Comments: Pleasant and cooperative for PT session        Exercises      Shoulder Instructions       General Comments      Pertinent Vitals/ Pain       Pain Assessment Pain Assessment: No/denies pain  Home Living                                          Prior Functioning/Environment              Frequency     Min of 1 x a week  Progress Toward Goals  OT Goals(current goals can now be found in the care plan section)  Progress towards OT goals: Progressing toward goals  Acute Rehab OT Goals Patient Stated Goal: To return home OT Goal Formulation: With patient/family Time For Goal Achievement: 03/16/23 Potential to Achieve Goals: Good  Plan      Co-evaluation                 AM-PAC OT "6 Clicks" Daily Activity     Outcome Measure   Help from another person eating meals?: None Help from another person taking care of personal grooming?: A Little Help from another person toileting, which includes using toliet, bedpan, or urinal?: A Little Help from another person bathing (including washing, rinsing, drying)?: A Little Help from another person to put on and taking off regular upper body clothing?: A Little Help from another person to put on and taking off regular lower body clothing?: A Lot 6 Click Score: 18    End of Session Equipment Utilized During Treatment: Rolling walker (2 wheels)  OT Visit Diagnosis: Other abnormalities of gait and mobility (R26.89);Muscle weakness (generalized) (M62.81)   Activity Tolerance Patient tolerated treatment well   Patient Left in bed;with call bell/phone within reach;with nursing/sitter in room   Nurse Communication          Time: 1125-1150 OT Time Calculation  (min): 25 min  Charges: OT General Charges $OT Visit: 1 Visit OT Treatments $Self Care/Home Management : 23-37 mins  Olegario Messier, MS, OTR/L   Olegario Messier 03/11/2023, 12:42 PM

## 2023-03-11 NOTE — Progress Notes (Signed)
Progress Note   Patient: Jenna Marshall ZOX:[id number] DOB: [birth year] DOA: 03/01/2023     10 DOS: the patient was seen and examined on 03/11/2023   Brief hospital course: Jenna Marshall is a 89 y.o. female with medical history significant for hypothyroidism/ h/o DVT , anemia h/o CAD coming for cough, not feeling well since Thursday. Pt denies any chest pain palpitation or bleeding.  Lives at home and has round the clock care. States she feel today but otherwise is stable and walks with limited difficulty due to her arthritis. Pt is alert oriented in ED, NAD. Pt meets sepsis criteria on presentation.  Labs shows: glucose of 120 and normal electrolytes/ renal function and LFT. Mild TNI at 37 from Covid and sepsis. Lactic 1.1 and cbc shows anemia of 10.6 and thrombocytopenia.  In ED pt was started on CAP regimen admitted to hospitalist service for evaluation of sepsis secondary to COVID viral pneumonia.  Bacterial pneumonia unlikely, antibiotics stopped.  Continued on supportive care, isolation precautions.  Her oxygen is weaned off.  She worked with PT OT recommended SNF.  She can go to [facility] 03/12/23 due to COVID isolation precautions.   Assessment and Plan: Sepsis secondary to COVID viral pneumonia Discontinued ceftriaxone and azithromycin, less likely bacterial infection.   WBC count within normal range, procalcitonin negative. Finished Paxlovid total 5-day course Mucinex p.o. twice daily. She is not on supplemental oxygen.   Acute hypoxic respiratory failure due to COVID viral pneumonia Supplemental O2 has been weaned off, currently saturating well on room air Hypoxic respiratory failure resolved   CAD, HTN BP stable. Continued aspirin, statin, Coreg, losartan Continue amlodipine 5 mg p.o. nightly with holding parameters   History of DVT, currently not on DOAC Continue DVT prophylaxis   Iron deficiency anemia, Hb 10.8 stable Continue multivitamin, diet supplements.    Thrombocytopenia,  Platelet count improved to 165.   Hypothyroid, continue Synthroid   GERD, continue PPI.  Constipation - continue colace, senna, miralax. Enema for severe constipation. Continue supportive care. Out of bed to chair. Fall, aspiration precautions.    Code Status: Limited: Do not attempt resuscitation (DNR) -DNR-LIMITED -Do Not Intubate/DNI      Subjective: Patient is seen and examined today morning.  She is lying in bed. Had small bowel movement yesterday. Denies nausea. Eating fair.  No overnight issues.   Physical Exam: Vitals:   03/11/23 0518 03/11/23 0907 03/11/23 1123 03/11/23 1617  BP: (!) 168/55 (!) 157/63 (!) 126/56 (!) 131/57  Pulse: 66 69 69 64  Resp: 18 16 16 16   Temp: 97.9 F (36.6 C) 97.7 F (36.5 C) 97.7 F (36.5 C) 97.8 F (36.6 C)  TempSrc: Oral Oral Oral Oral  SpO2: 95% 96% 97% 95%  Weight:      Height:        General - Elderly Caucasian female, sitting, no apparent distress HEENT - PERRLA, EOMI, atraumatic head, hard of hearing Lung - Clear, bibasilar rales, no rhonchi, wheezes. Heart - S1, S2 heard, no murmurs, rubs, trace pedal edema. Abdomen - Soft, non tender, distended, bowel sounds good Neuro - Alert, awake and oriented, non focal exam. Skin - Warm and dry.  Data Reviewed:      Latest Ref Rng & Units 03/11/2023    6:01 AM 03/07/2023    6:19 AM 03/06/2023    6:41 AM  CBC  WBC 4.0 - 10.5 K/uL 7.7  5.8  6.7   Hemoglobin 12.0 - 15.0 g/dL 81.1  91.4  10.5   Hematocrit 36.0 - 46.0 % 31.6  30.3  31.7   Platelets 150 - 400 K/uL 237  165  142       Latest Ref Rng & Units 03/07/2023    6:19 AM 03/06/2023    6:41 AM 03/05/2023    7:04 AM  BMP  Glucose 70 - 99 mg/dL 161  96  096   BUN 8 - 23 mg/dL 19  21  20    Creatinine 0.44 - 1.00 mg/dL 0.45  4.09  8.11   Sodium 135 - 145 mmol/L 133  130  131   Potassium 3.5 - 5.1 mmol/L 3.7  3.6  3.5   Chloride 98 - 111 mmol/L 98  96  100   CO2 22 - 32 mmol/L 27  25  26    Calcium 8.9  - 10.3 mg/dL 8.2  8.0  7.9    No results found.   Family Communication: Discussed with patient, she is awaiting placement.   Disposition: Status is: Inpatient Remains inpatient appropriate because: tomorrow to SNF when she is off isolation  Planned Discharge Destination: Skilled nursing facility 03/12/23      Time spent: 35 minutes  Author: Marcelino Duster, MD 03/11/2023 5:19 PM Secure chat 7am to 7pm For on call review [email or web address].

## 2023-03-12 DIAGNOSIS — M170 Bilateral primary osteoarthritis of knee: Secondary | ICD-10-CM | POA: Diagnosis not present

## 2023-03-12 DIAGNOSIS — J1282 Pneumonia due to coronavirus disease 2019: Secondary | ICD-10-CM | POA: Diagnosis not present

## 2023-03-12 DIAGNOSIS — I10 Essential (primary) hypertension: Secondary | ICD-10-CM | POA: Diagnosis not present

## 2023-03-12 DIAGNOSIS — E039 Hypothyroidism, unspecified: Secondary | ICD-10-CM | POA: Diagnosis not present

## 2023-03-12 DIAGNOSIS — I959 Hypotension, unspecified: Secondary | ICD-10-CM | POA: Diagnosis not present

## 2023-03-12 DIAGNOSIS — Z7401 Bed confinement status: Secondary | ICD-10-CM | POA: Diagnosis not present

## 2023-03-12 DIAGNOSIS — A4189 Other specified sepsis: Secondary | ICD-10-CM | POA: Diagnosis not present

## 2023-03-12 DIAGNOSIS — U071 COVID-19: Secondary | ICD-10-CM | POA: Diagnosis not present

## 2023-03-12 DIAGNOSIS — J9601 Acute respiratory failure with hypoxia: Secondary | ICD-10-CM | POA: Diagnosis not present

## 2023-03-12 DIAGNOSIS — K5901 Slow transit constipation: Secondary | ICD-10-CM | POA: Diagnosis not present

## 2023-03-12 MED ORDER — HYDRALAZINE HCL 50 MG PO TABS: 50.0000 mg | ORAL_TABLET | Freq: Four times a day (QID) | ORAL | Status: DC | PRN

## 2023-03-12 MED ORDER — POLYETHYLENE GLYCOL 3350 17 G PO PACK: 17.0000 g | PACK | Freq: Every day | ORAL | Status: DC

## 2023-03-12 MED ORDER — DOCUSATE SODIUM 100 MG PO CAPS: 100.0000 mg | ORAL_CAPSULE | Freq: Two times a day (BID) | ORAL | Status: AC

## 2023-03-12 MED ORDER — AMLODIPINE BESYLATE 10 MG PO TABS: 10.0000 mg | ORAL_TABLET | Freq: Every day | ORAL | Status: DC

## 2023-03-12 NOTE — TOC Transition Note (Signed)
Transition of Care [facility]) - CM/SW Discharge Note   Patient Details  Name: Jenna Marshall MRN: [id number] Date of Birth: [birth year]  Transition of Care [facility]) CM/SW Contact:  Darolyn Rua, LCSW Phone Number: 03/12/2023, 10:22 AM   Clinical Narrative:     Patient will DC to: [facility] Anticipated DC date: 03/12/23 Family notified:daughter Cathy Transport by: Wendie Simmer  Per MD patient ready for DC to [facility] . RN, patient, patient's family, and facility notified of DC. Discharge Summary sent to facility. RN given number for report [phone] Room 701. DC packet on chart. Ambulance transport requested for patient.  CSW signing off.    Final next level of care: Skilled Nursing Facility Barriers to Discharge: No Barriers Identified   Patient Goals and CMS Choice CMS Medicare.gov Compare Post Acute Care list provided to:: Patient Choice offered to / list presented to : Patient  Discharge Placement                      Patient and family notified of of transfer: 03/12/23  Discharge Plan and Services Additional resources added to the After Visit Summary for       Post Acute Care Choice: Skilled Nursing Facility                               Social Determinants of Health (SDOH) Interventions SDOH Screenings   Food Insecurity: No Food Insecurity (02/23/2023)  Housing: Low Risk  (02/23/2023)  Transportation Needs: No Transportation Needs (02/23/2023)  Utilities: Not At Risk (01/01/2023)  Alcohol Screen: Low Risk  (01/01/2023)  Depression (PHQ2-9): Low Risk  (02/24/2023)  Financial Resource Strain: Medium Risk (02/23/2023)  Physical Activity: Unknown (02/23/2023)  Recent Concern: Physical Activity - Inactive (01/01/2023)  Social Connections: Moderately Isolated (02/23/2023)  Stress: Stress Concern Present (02/23/2023)  Tobacco Use: Low Risk  (03/01/2023)  Health Literacy: Inadequate Health Literacy (01/01/2023)     Readmission Risk Interventions    03/02/2023    3:03  PM  Readmission Risk Prevention Plan  Transportation Screening Complete  PCP or Specialist Appt within 5-7 Days Complete  Home Care Screening Complete  Medication Review (RN CM) Complete

## 2023-03-12 NOTE — Discharge Summary (Signed)
Physician Discharge Summary   Patient: Jenna Marshall MRN: [id number] DOB: 1926-04-01  Admit date:     03/01/2023  Discharge date: 03/12/23  Discharge Physician: Loyce Dys   PCP: Sherlene Shams, MD   Recommendations at discharge:  Follow-up with primary care  Discharge Diagnoses: Sepsis secondary to COVID viral pneumonia Acute hypoxic respiratory failure due to COVID viral pneumonia CAD, HTN Iron Deficiency anemia History of DVT Thrombocytopenia,  Hypothyroid GERD Constipation    Hospital Course: Jenna Marshall is a 89 y.o. female with medical history significant for hypothyroidism/ h/o DVT , anemia h/o CAD coming for cough, not feeling well. Pt denies any chest pain palpitation or bleeding.  Lives at home and has round the clock care.  Upon arrival to the emergency room Labs showed: glucose of 120 and normal electrolytes/ renal function and LFT. Mild TNI at 37 from Covid and sepsis. Lactic 1.1 and cbc shows anemia of 10.6 and thrombocytopenia.  In ED pt was started on CAP regimen admitted to hospitalist service for evaluation of sepsis secondary to COVID viral pneumonia.   Bacterial pneumonia unlikely, antibiotics stopped.  Continued on supportive care, isolation precautions.  Her oxygen is weaned off.  She worked with PT OT recommended SNF.  She can [facility]12/[facility] due to COVID isolation precautions.   Consultants: None Procedures performed: None Disposition: Skilled nursing facility Diet recommendation:  Cardiac diet DISCHARGE MEDICATION: Allergies as of 03/12/2023       Reactions   Ambien [zolpidem] Other (See Comments)   Reaction:  Hallucinations   Codeine Nausea And Vomiting   Lasix [furosemide] Itching, Rash        Medication List     STOP taking these medications    PreserVision AREDS 2 Caps   torsemide 10 MG tablet Commonly known as: DEMADEX   traMADol 50 MG tablet Commonly known as: ULTRAM       TAKE these medications    acetaminophen  325 MG tablet Commonly known as: TYLENOL Take 2 tablets (650 mg total) by mouth every 6 (six) hours as needed for mild pain.   amLODipine 10 MG tablet Commonly known as: NORVASC Take 1 tablet (10 mg total) by mouth at bedtime.   aspirin 81 MG chewable tablet Chew 1 tablet (81 mg total) by mouth daily.   atorvastatin 40 MG tablet Commonly known as: LIPITOR TAKE 1 TABLET(40 MG) BY MOUTH DAILY   Calcium Carbonate-Vitamin D 600-200 MG-UNIT Tabs Take 1 tablet by mouth 2 (two) times daily.   carvedilol 3.125 MG tablet Commonly known as: COREG TAKE 1 TABLET(3.125 MG) BY MOUTH TWICE DAILY WITH A MEAL   docusate sodium 100 MG capsule Commonly known as: COLACE Take 1 capsule (100 mg total) by mouth 2 (two) times daily.   hydrALAZINE 50 MG tablet Commonly known as: APRESOLINE Take 1 tablet (50 mg total) by mouth every 6 (six) hours as needed (SBP >150).   levothyroxine 25 MCG tablet Commonly known as: SYNTHROID TAKE 1 TABLET(25 MCG) BY MOUTH DAILY BEFORE BREAKFAST   losartan 100 MG tablet Commonly known as: COZAAR Take 1 tablet (100 mg total) by mouth daily.   mirabegron ER 25 MG Tb24 tablet Commonly known as: Myrbetriq Take 1 tablet (25 mg total) by mouth daily.   multivitamin with minerals Tabs tablet Take 1 tablet by mouth daily.   ondansetron 4 MG tablet Commonly known as: Zofran Take 1 tablet (4 mg total) by mouth every 8 (eight) hours as needed for nausea  or vomiting.   pantoprazole 40 MG tablet Commonly known as: PROTONIX Take 1 tablet (40 mg total) by mouth daily.   polycarbophil 625 MG tablet Commonly known as: FIBERCON Take 625 mg by mouth daily.   polyethylene glycol 17 g packet Commonly known as: MIRALAX / GLYCOLAX Take 17 g by mouth daily. Start taking on: March 13, 2023   senna-docusate 8.6-50 MG tablet Commonly known as: Senokot-S Take 1 tablet by mouth 2 (two) times daily.   Voltaren 1 % Gel Generic drug: diclofenac Sodium Apply 2-4 g  topically 4 (four) times daily as needed (pain).        Discharge Exam: Filed Weights   03/01/23 2200 03/08/23 0422  Weight: 72 kg 72.9 kg   General - Elderly Caucasian female, sitting, no apparent distress HEENT - PERRLA, EOMI, atraumatic head, hard of hearing Lung - Clear, bibasilar rales, no rhonchi, wheezes. Heart - S1, S2 heard, no murmurs, rubs, trace pedal edema. Abdomen - Soft, non tender, distended, bowel sounds good Neuro - Alert, awake and oriented, non focal exam. Skin - Warm and dry.  Condition at discharge: good     Discharge time spent:  34 minutes.  Signed: Loyce Dys, MD [facility] 03/12/2023

## 2023-03-12 NOTE — Plan of Care (Signed)

## 2023-03-12 NOTE — NC FL2 (Signed)
[place] MEDICAID FL2 LEVEL OF CARE FORM     IDENTIFICATION  Patient Name: Jenna Marshall Birthdate: 14-Jan-1926 Sex: female Admission Date (Current Location): 03/01/2023  [facility] and [place] Number:  Chiropodist and Address:  [facility], [address], [place], [place] [address]      Provider Number: [id number]  Attending Physician Name and Address:  Loyce Dys, MD  Relative Name and Phone Number:  Kathrin Ruddy (Daughter)  [phone] (Mobile)    Current Level of Care: Hospital Recommended Level of Care: Skilled Nursing Facility Prior Approval Number:    Date Approved/Denied:   PASRR Number:    Discharge Plan: SNF    Current Diagnoses: Patient Active Problem List   Diagnosis Date Noted   Pneumonia due to COVID-19 virus 03/01/2023   Sepsis due to pneumonia (HCC) 03/01/2023   Thrombocytopenia (HCC) 03/01/2023   Cough 03/01/2023   General weakness 03/07/2020   Unsteady gait when walking 03/07/2020   Balance problem 03/07/2020   Chronic left hip pain 01/17/2020   Visit for preventive health examination 05/09/2016   Nocturia more than twice per night 05/09/2016   Neoplasm of skin of earlobe 11/03/2015   Iron deficiency anemia 03/03/2015   Left-sided thoracic back pain 03/03/2015   History of hip fracture 01/13/2015   Drug rash 12/29/2014   S/P CABG x 4 10/14/2014   CAD (coronary artery disease)    Elevated blood pressure reading with diagnosis of hypertension 10/11/2014   Hyperlipidemia 10/11/2014   Gastroesophageal reflux disease without esophagitis 10/11/2014   Hypothyroidism 10/11/2014   Medicare annual wellness visit, subsequent 04/01/2014   Urinary incontinence, urge 09/25/2013   Osteopenia of the elderly 05/13/2013   Knee pain, bilateral 04/05/2013   Long-term use of high-risk medication 03/12/2013   History of shingles 09/02/2012   History of DVT of lower extremity 09/02/2012   Obesity (BMI 30-39.9)  09/01/2011   Macular degeneration     Orientation RESPIRATION BLADDER Height & Weight     Self, Time, Situation, Place  O2 (021L per nasal cannula) External catheter Weight: 72.9 kg Height:  5\' 1"  (154.9 cm)  BEHAVIORAL SYMPTOMS/MOOD NEUROLOGICAL BOWEL NUTRITION STATUS  Other (Comment) (n/a)  (n/a) Continent Diet  AMBULATORY STATUS COMMUNICATION OF NEEDS Skin   Limited Assist Verbally Other (Comment) (Erythema to breast and groin)                       Personal Care Assistance Level of Assistance  Bathing, Dressing Bathing Assistance: Limited assistance   Dressing Assistance: Limited assistance     Functional Limitations Info  Hearing   Hearing Info: Impaired      SPECIAL CARE FACTORS FREQUENCY  PT (By licensed PT), OT (By licensed OT)     PT Frequency: Min 2x weekly OT Frequency: Min 2x weekly            Contractures Contractures Info: Not present    Additional Factors Info  Code Status, Allergies Code Status Info: Full Allergies Info: Ambien (Zolpidem), Codeine, Lasix (Furosemide)           Current Medications (03/12/2023):  This is the current hospital active medication list Current Facility-Administered Medications  Medication Dose Route Frequency Provider Last Rate Last Admin   acetaminophen (TYLENOL) tablet 650 mg  650 mg Oral Q6H PRN Gertha Calkin, MD   650 mg at 03/08/23 2117   amLODipine (NORVASC) tablet 10 mg  10 mg Oral QHS Marcelino Duster, MD  10 mg at 03/11/23 2101   aspirin chewable tablet 81 mg  81 mg Oral Daily Gertha Calkin, MD   81 mg at 03/12/23 0900   atorvastatin (LIPITOR) tablet 40 mg  40 mg Oral Daily Marcelino Duster, MD   40 mg at 03/12/23 0900   benzonatate (TESSALON) capsule 100 mg  100 mg Oral TID PRN Gillis Santa, MD   100 mg at 03/04/23 2104   carvedilol (COREG) tablet 3.125 mg  3.125 mg Oral BID WC Irena Cords V, MD   3.125 mg at 03/12/23 0900   chlorpheniramine-HYDROcodone (TUSSIONEX) 10-8 MG/5ML suspension 5  mL  5 mL Oral Q12H PRN Gillis Santa, MD       diclofenac Sodium (VOLTAREN) 1 % topical gel 2 g  2 g Topical QID PRN Marcelino Duster, MD       docusate sodium (COLACE) capsule 100 mg  100 mg Oral BID Marcelino Duster, MD   100 mg at 03/12/23 0900   enoxaparin (LOVENOX) injection 40 mg  40 mg Subcutaneous Q24H Gillis Santa, MD   40 mg at 03/12/23 0827   feeding supplement (ENSURE ENLIVE / ENSURE PLUS) liquid 237 mL  237 mL Oral BID BM Gillis Santa, MD   237 mL at 03/12/23 0828   guaiFENesin (MUCINEX) 12 hr tablet 600 mg  600 mg Oral BID Gillis Santa, MD   600 mg at 03/12/23 0900   hydrALAZINE (APRESOLINE) injection 10 mg  10 mg Intravenous Q6H PRN Gillis Santa, MD   10 mg at 03/04/23 1045   Or   hydrALAZINE (APRESOLINE) tablet 50 mg  50 mg Oral Q6H PRN Gillis Santa, MD   50 mg at 03/11/23 0600   levothyroxine (SYNTHROID) tablet 25 mcg  25 mcg Oral Q0600 Gertha Calkin, MD   25 mcg at 03/12/23 0721   losartan (COZAAR) tablet 100 mg  100 mg Oral Daily Irena Cords V, MD   100 mg at 03/12/23 0900   menthol-cetylpyridinium (CEPACOL) lozenge 3 mg  1 lozenge Oral PRN Gillis Santa, MD       mirabegron ER [facility]) tablet 25 mg  25 mg Oral Daily Irena Cords V, MD   25 mg at 03/12/23 0900   multivitamin with minerals tablet 1 tablet  1 tablet Oral Daily Gillis Santa, MD   1 tablet at 03/12/23 0900   nystatin (MYCOSTATIN/NYSTOP) topical powder   Topical BID Gillis Santa, MD   Given at 03/12/23 0828   ondansetron (ZOFRAN) tablet 4 mg  4 mg Oral Q8H PRN Gertha Calkin, MD   4 mg at 03/11/23 1455   pantoprazole (PROTONIX) EC tablet 40 mg  40 mg Oral BID Tressie Ellis, RPH   40 mg at 03/12/23 0900   polyethylene glycol (MIRALAX / GLYCOLAX) packet 17 g  17 g Oral Daily Marcelino Duster, MD   17 g at 03/12/23 0827   senna (SENOKOT) tablet 17.2 mg  2 tablet Oral QHS Marcelino Duster, MD   17.2 mg at 03/11/23 2101     Discharge Medications: Please see discharge summary for a list  of discharge medications.  Relevant Imaging Results:  Relevant Lab Results:   Additional Information SS# [id number]  Truddie Hidden, RN

## 2023-03-13 DIAGNOSIS — J9601 Acute respiratory failure with hypoxia: Secondary | ICD-10-CM | POA: Diagnosis not present

## 2023-03-14 DIAGNOSIS — J1282 Pneumonia due to coronavirus disease 2019: Secondary | ICD-10-CM | POA: Diagnosis not present

## 2023-03-14 DIAGNOSIS — M170 Bilateral primary osteoarthritis of knee: Secondary | ICD-10-CM | POA: Diagnosis not present

## 2023-03-14 DIAGNOSIS — E039 Hypothyroidism, unspecified: Secondary | ICD-10-CM | POA: Diagnosis not present

## 2023-03-14 DIAGNOSIS — I10 Essential (primary) hypertension: Secondary | ICD-10-CM | POA: Diagnosis not present

## 2023-03-18 DIAGNOSIS — J1282 Pneumonia due to coronavirus disease 2019: Secondary | ICD-10-CM | POA: Diagnosis not present

## 2023-03-18 DIAGNOSIS — I10 Essential (primary) hypertension: Secondary | ICD-10-CM | POA: Diagnosis not present

## 2023-03-20 DIAGNOSIS — I10 Essential (primary) hypertension: Secondary | ICD-10-CM | POA: Diagnosis not present

## 2023-03-20 DIAGNOSIS — K5901 Slow transit constipation: Secondary | ICD-10-CM | POA: Diagnosis not present

## 2023-03-20 DIAGNOSIS — J1282 Pneumonia due to coronavirus disease 2019: Secondary | ICD-10-CM | POA: Diagnosis not present

## 2023-03-24 DIAGNOSIS — K5901 Slow transit constipation: Secondary | ICD-10-CM | POA: Diagnosis not present

## 2023-03-24 DIAGNOSIS — I10 Essential (primary) hypertension: Secondary | ICD-10-CM | POA: Diagnosis not present

## 2023-03-24 DIAGNOSIS — J1282 Pneumonia due to coronavirus disease 2019: Secondary | ICD-10-CM | POA: Diagnosis not present

## 2023-03-27 DIAGNOSIS — I10 Essential (primary) hypertension: Secondary | ICD-10-CM | POA: Diagnosis not present

## 2023-03-27 DIAGNOSIS — J1282 Pneumonia due to coronavirus disease 2019: Secondary | ICD-10-CM | POA: Diagnosis not present

## 2023-03-27 DIAGNOSIS — D649 Anemia, unspecified: Secondary | ICD-10-CM | POA: Diagnosis not present

## 2023-03-27 DIAGNOSIS — K5901 Slow transit constipation: Secondary | ICD-10-CM | POA: Diagnosis not present

## 2023-04-01 DIAGNOSIS — J1282 Pneumonia due to coronavirus disease 2019: Secondary | ICD-10-CM | POA: Diagnosis not present

## 2023-04-01 DIAGNOSIS — I10 Essential (primary) hypertension: Secondary | ICD-10-CM | POA: Diagnosis not present

## 2023-04-01 DIAGNOSIS — M6281 Muscle weakness (generalized): Secondary | ICD-10-CM | POA: Diagnosis not present

## 2023-04-01 DIAGNOSIS — K5901 Slow transit constipation: Secondary | ICD-10-CM | POA: Diagnosis not present

## 2023-04-01 DIAGNOSIS — D649 Anemia, unspecified: Secondary | ICD-10-CM | POA: Diagnosis not present

## 2023-04-03 DIAGNOSIS — D510 Vitamin B12 deficiency anemia due to intrinsic factor deficiency: Secondary | ICD-10-CM | POA: Diagnosis not present

## 2023-04-03 DIAGNOSIS — Z51A Encounter for sepsis aftercare: Secondary | ICD-10-CM | POA: Diagnosis not present

## 2023-04-03 DIAGNOSIS — I10 Essential (primary) hypertension: Secondary | ICD-10-CM | POA: Diagnosis not present

## 2023-04-03 DIAGNOSIS — H3530 Unspecified macular degeneration: Secondary | ICD-10-CM | POA: Diagnosis not present

## 2023-04-03 DIAGNOSIS — I8390 Asymptomatic varicose veins of unspecified lower extremity: Secondary | ICD-10-CM | POA: Diagnosis not present

## 2023-04-03 DIAGNOSIS — U071 COVID-19: Secondary | ICD-10-CM | POA: Diagnosis not present

## 2023-04-03 DIAGNOSIS — I2510 Atherosclerotic heart disease of native coronary artery without angina pectoris: Secondary | ICD-10-CM | POA: Diagnosis not present

## 2023-04-03 DIAGNOSIS — J1282 Pneumonia due to coronavirus disease 2019: Secondary | ICD-10-CM | POA: Diagnosis not present

## 2023-04-03 DIAGNOSIS — I351 Nonrheumatic aortic (valve) insufficiency: Secondary | ICD-10-CM | POA: Diagnosis not present

## 2023-04-05 ENCOUNTER — Other Ambulatory Visit: Payer: Self-pay | Admitting: Internal Medicine

## 2023-04-07 ENCOUNTER — Telehealth: Payer: Self-pay

## 2023-04-07 DIAGNOSIS — U071 COVID-19: Secondary | ICD-10-CM | POA: Diagnosis not present

## 2023-04-07 DIAGNOSIS — Z51A Encounter for sepsis aftercare: Secondary | ICD-10-CM | POA: Diagnosis not present

## 2023-04-07 DIAGNOSIS — J1282 Pneumonia due to coronavirus disease 2019: Secondary | ICD-10-CM | POA: Diagnosis not present

## 2023-04-07 DIAGNOSIS — I8390 Asymptomatic varicose veins of unspecified lower extremity: Secondary | ICD-10-CM | POA: Diagnosis not present

## 2023-04-07 DIAGNOSIS — I2510 Atherosclerotic heart disease of native coronary artery without angina pectoris: Secondary | ICD-10-CM | POA: Diagnosis not present

## 2023-04-07 DIAGNOSIS — I10 Essential (primary) hypertension: Secondary | ICD-10-CM | POA: Diagnosis not present

## 2023-04-07 DIAGNOSIS — D510 Vitamin B12 deficiency anemia due to intrinsic factor deficiency: Secondary | ICD-10-CM | POA: Diagnosis not present

## 2023-04-07 DIAGNOSIS — I351 Nonrheumatic aortic (valve) insufficiency: Secondary | ICD-10-CM | POA: Diagnosis not present

## 2023-04-07 DIAGNOSIS — H3530 Unspecified macular degeneration: Secondary | ICD-10-CM | POA: Diagnosis not present

## 2023-04-07 NOTE — Telephone Encounter (Signed)
FYI

## 2023-04-07 NOTE — Telephone Encounter (Signed)
Clayton Lefort called from [facility] to states patient was recently discharged from [facility].  Clayton Lefort states he will fax request for home health orders.

## 2023-04-08 DIAGNOSIS — J1282 Pneumonia due to coronavirus disease 2019: Secondary | ICD-10-CM | POA: Diagnosis not present

## 2023-04-08 DIAGNOSIS — Z51A Encounter for sepsis aftercare: Secondary | ICD-10-CM | POA: Diagnosis not present

## 2023-04-08 DIAGNOSIS — I8390 Asymptomatic varicose veins of unspecified lower extremity: Secondary | ICD-10-CM | POA: Diagnosis not present

## 2023-04-08 DIAGNOSIS — D510 Vitamin B12 deficiency anemia due to intrinsic factor deficiency: Secondary | ICD-10-CM | POA: Diagnosis not present

## 2023-04-08 DIAGNOSIS — I2510 Atherosclerotic heart disease of native coronary artery without angina pectoris: Secondary | ICD-10-CM | POA: Diagnosis not present

## 2023-04-08 DIAGNOSIS — I351 Nonrheumatic aortic (valve) insufficiency: Secondary | ICD-10-CM | POA: Diagnosis not present

## 2023-04-08 DIAGNOSIS — U071 COVID-19: Secondary | ICD-10-CM | POA: Diagnosis not present

## 2023-04-08 DIAGNOSIS — I10 Essential (primary) hypertension: Secondary | ICD-10-CM | POA: Diagnosis not present

## 2023-04-08 DIAGNOSIS — H3530 Unspecified macular degeneration: Secondary | ICD-10-CM | POA: Diagnosis not present

## 2023-04-09 DIAGNOSIS — I2510 Atherosclerotic heart disease of native coronary artery without angina pectoris: Secondary | ICD-10-CM | POA: Diagnosis not present

## 2023-04-09 DIAGNOSIS — I8390 Asymptomatic varicose veins of unspecified lower extremity: Secondary | ICD-10-CM | POA: Diagnosis not present

## 2023-04-09 DIAGNOSIS — U071 COVID-19: Secondary | ICD-10-CM | POA: Diagnosis not present

## 2023-04-09 DIAGNOSIS — J1282 Pneumonia due to coronavirus disease 2019: Secondary | ICD-10-CM | POA: Diagnosis not present

## 2023-04-09 DIAGNOSIS — H3530 Unspecified macular degeneration: Secondary | ICD-10-CM | POA: Diagnosis not present

## 2023-04-09 DIAGNOSIS — Z51A Encounter for sepsis aftercare: Secondary | ICD-10-CM | POA: Diagnosis not present

## 2023-04-09 DIAGNOSIS — I351 Nonrheumatic aortic (valve) insufficiency: Secondary | ICD-10-CM | POA: Diagnosis not present

## 2023-04-09 DIAGNOSIS — D510 Vitamin B12 deficiency anemia due to intrinsic factor deficiency: Secondary | ICD-10-CM | POA: Diagnosis not present

## 2023-04-09 DIAGNOSIS — I10 Essential (primary) hypertension: Secondary | ICD-10-CM | POA: Diagnosis not present

## 2023-04-10 DIAGNOSIS — I8390 Asymptomatic varicose veins of unspecified lower extremity: Secondary | ICD-10-CM | POA: Diagnosis not present

## 2023-04-10 DIAGNOSIS — I351 Nonrheumatic aortic (valve) insufficiency: Secondary | ICD-10-CM | POA: Diagnosis not present

## 2023-04-10 DIAGNOSIS — D510 Vitamin B12 deficiency anemia due to intrinsic factor deficiency: Secondary | ICD-10-CM | POA: Diagnosis not present

## 2023-04-10 DIAGNOSIS — H3530 Unspecified macular degeneration: Secondary | ICD-10-CM | POA: Diagnosis not present

## 2023-04-10 DIAGNOSIS — U071 COVID-19: Secondary | ICD-10-CM | POA: Diagnosis not present

## 2023-04-10 DIAGNOSIS — J1282 Pneumonia due to coronavirus disease 2019: Secondary | ICD-10-CM | POA: Diagnosis not present

## 2023-04-10 DIAGNOSIS — I2510 Atherosclerotic heart disease of native coronary artery without angina pectoris: Secondary | ICD-10-CM | POA: Diagnosis not present

## 2023-04-10 DIAGNOSIS — I10 Essential (primary) hypertension: Secondary | ICD-10-CM | POA: Diagnosis not present

## 2023-04-10 DIAGNOSIS — Z51A Encounter for sepsis aftercare: Secondary | ICD-10-CM | POA: Diagnosis not present

## 2023-04-14 DIAGNOSIS — M170 Bilateral primary osteoarthritis of knee: Secondary | ICD-10-CM

## 2023-04-14 DIAGNOSIS — Z51A Encounter for sepsis aftercare: Secondary | ICD-10-CM | POA: Diagnosis not present

## 2023-04-14 DIAGNOSIS — I10 Essential (primary) hypertension: Secondary | ICD-10-CM | POA: Diagnosis not present

## 2023-04-14 DIAGNOSIS — I2510 Atherosclerotic heart disease of native coronary artery without angina pectoris: Secondary | ICD-10-CM | POA: Diagnosis not present

## 2023-04-14 DIAGNOSIS — I351 Nonrheumatic aortic (valve) insufficiency: Secondary | ICD-10-CM | POA: Diagnosis not present

## 2023-04-14 DIAGNOSIS — I8390 Asymptomatic varicose veins of unspecified lower extremity: Secondary | ICD-10-CM | POA: Diagnosis not present

## 2023-04-14 DIAGNOSIS — D510 Vitamin B12 deficiency anemia due to intrinsic factor deficiency: Secondary | ICD-10-CM | POA: Diagnosis not present

## 2023-04-14 DIAGNOSIS — E785 Hyperlipidemia, unspecified: Secondary | ICD-10-CM

## 2023-04-14 DIAGNOSIS — E039 Hypothyroidism, unspecified: Secondary | ICD-10-CM

## 2023-04-14 DIAGNOSIS — U071 COVID-19: Secondary | ICD-10-CM | POA: Diagnosis not present

## 2023-04-14 DIAGNOSIS — H3530 Unspecified macular degeneration: Secondary | ICD-10-CM | POA: Diagnosis not present

## 2023-04-14 DIAGNOSIS — J1282 Pneumonia due to coronavirus disease 2019: Secondary | ICD-10-CM | POA: Diagnosis not present

## 2023-04-15 DIAGNOSIS — J1282 Pneumonia due to coronavirus disease 2019: Secondary | ICD-10-CM | POA: Diagnosis not present

## 2023-04-15 DIAGNOSIS — I8390 Asymptomatic varicose veins of unspecified lower extremity: Secondary | ICD-10-CM | POA: Diagnosis not present

## 2023-04-15 DIAGNOSIS — I351 Nonrheumatic aortic (valve) insufficiency: Secondary | ICD-10-CM | POA: Diagnosis not present

## 2023-04-15 DIAGNOSIS — I2510 Atherosclerotic heart disease of native coronary artery without angina pectoris: Secondary | ICD-10-CM | POA: Diagnosis not present

## 2023-04-15 DIAGNOSIS — U071 COVID-19: Secondary | ICD-10-CM | POA: Diagnosis not present

## 2023-04-15 DIAGNOSIS — H3530 Unspecified macular degeneration: Secondary | ICD-10-CM | POA: Diagnosis not present

## 2023-04-15 DIAGNOSIS — Z51A Encounter for sepsis aftercare: Secondary | ICD-10-CM | POA: Diagnosis not present

## 2023-04-15 DIAGNOSIS — I10 Essential (primary) hypertension: Secondary | ICD-10-CM | POA: Diagnosis not present

## 2023-04-15 DIAGNOSIS — D510 Vitamin B12 deficiency anemia due to intrinsic factor deficiency: Secondary | ICD-10-CM | POA: Diagnosis not present

## 2023-04-17 ENCOUNTER — Telehealth: Payer: Self-pay | Admitting: Internal Medicine

## 2023-04-17 DIAGNOSIS — I351 Nonrheumatic aortic (valve) insufficiency: Secondary | ICD-10-CM | POA: Diagnosis not present

## 2023-04-17 DIAGNOSIS — D510 Vitamin B12 deficiency anemia due to intrinsic factor deficiency: Secondary | ICD-10-CM | POA: Diagnosis not present

## 2023-04-17 DIAGNOSIS — I2510 Atherosclerotic heart disease of native coronary artery without angina pectoris: Secondary | ICD-10-CM | POA: Diagnosis not present

## 2023-04-17 DIAGNOSIS — U071 COVID-19: Secondary | ICD-10-CM | POA: Diagnosis not present

## 2023-04-17 DIAGNOSIS — H3530 Unspecified macular degeneration: Secondary | ICD-10-CM | POA: Diagnosis not present

## 2023-04-17 DIAGNOSIS — I10 Essential (primary) hypertension: Secondary | ICD-10-CM | POA: Diagnosis not present

## 2023-04-17 DIAGNOSIS — I8390 Asymptomatic varicose veins of unspecified lower extremity: Secondary | ICD-10-CM | POA: Diagnosis not present

## 2023-04-17 DIAGNOSIS — J1282 Pneumonia due to coronavirus disease 2019: Secondary | ICD-10-CM | POA: Diagnosis not present

## 2023-04-17 DIAGNOSIS — Z51A Encounter for sepsis aftercare: Secondary | ICD-10-CM | POA: Diagnosis not present

## 2023-04-17 NOTE — Telephone Encounter (Signed)
Pt daughter came into the office to drop off medical forms. Placed in provider box.

## 2023-04-18 ENCOUNTER — Encounter: Payer: Self-pay | Admitting: Internal Medicine

## 2023-04-18 NOTE — Telephone Encounter (Signed)
Forms placed on Jenna Marshall's desk, filled out NPI did not get to finish

## 2023-04-22 DIAGNOSIS — U071 COVID-19: Secondary | ICD-10-CM | POA: Diagnosis not present

## 2023-04-22 DIAGNOSIS — I10 Essential (primary) hypertension: Secondary | ICD-10-CM | POA: Diagnosis not present

## 2023-04-22 DIAGNOSIS — I8390 Asymptomatic varicose veins of unspecified lower extremity: Secondary | ICD-10-CM | POA: Diagnosis not present

## 2023-04-22 DIAGNOSIS — Z51A Encounter for sepsis aftercare: Secondary | ICD-10-CM | POA: Diagnosis not present

## 2023-04-22 DIAGNOSIS — I351 Nonrheumatic aortic (valve) insufficiency: Secondary | ICD-10-CM | POA: Diagnosis not present

## 2023-04-22 DIAGNOSIS — D510 Vitamin B12 deficiency anemia due to intrinsic factor deficiency: Secondary | ICD-10-CM | POA: Diagnosis not present

## 2023-04-22 DIAGNOSIS — H3530 Unspecified macular degeneration: Secondary | ICD-10-CM | POA: Diagnosis not present

## 2023-04-22 DIAGNOSIS — I2510 Atherosclerotic heart disease of native coronary artery without angina pectoris: Secondary | ICD-10-CM | POA: Diagnosis not present

## 2023-04-22 DIAGNOSIS — J1282 Pneumonia due to coronavirus disease 2019: Secondary | ICD-10-CM | POA: Diagnosis not present

## 2023-04-23 NOTE — Telephone Encounter (Signed)
Form has been placed in red folder.  

## 2023-04-24 DIAGNOSIS — J1282 Pneumonia due to coronavirus disease 2019: Secondary | ICD-10-CM | POA: Diagnosis not present

## 2023-04-24 DIAGNOSIS — I8390 Asymptomatic varicose veins of unspecified lower extremity: Secondary | ICD-10-CM | POA: Diagnosis not present

## 2023-04-24 DIAGNOSIS — H3530 Unspecified macular degeneration: Secondary | ICD-10-CM | POA: Diagnosis not present

## 2023-04-24 DIAGNOSIS — U071 COVID-19: Secondary | ICD-10-CM | POA: Diagnosis not present

## 2023-04-24 DIAGNOSIS — Z51A Encounter for sepsis aftercare: Secondary | ICD-10-CM | POA: Diagnosis not present

## 2023-04-24 DIAGNOSIS — I10 Essential (primary) hypertension: Secondary | ICD-10-CM | POA: Diagnosis not present

## 2023-04-24 DIAGNOSIS — D510 Vitamin B12 deficiency anemia due to intrinsic factor deficiency: Secondary | ICD-10-CM | POA: Diagnosis not present

## 2023-04-24 DIAGNOSIS — I2510 Atherosclerotic heart disease of native coronary artery without angina pectoris: Secondary | ICD-10-CM | POA: Diagnosis not present

## 2023-04-24 DIAGNOSIS — I351 Nonrheumatic aortic (valve) insufficiency: Secondary | ICD-10-CM | POA: Diagnosis not present

## 2023-04-25 NOTE — Telephone Encounter (Signed)
LMTCB with pt's daughter, Larene Beach.

## 2023-04-28 ENCOUNTER — Encounter: Payer: Self-pay | Admitting: Internal Medicine

## 2023-04-28 ENCOUNTER — Telehealth (INDEPENDENT_AMBULATORY_CARE_PROVIDER_SITE_OTHER): Payer: Medicare HMO | Admitting: Internal Medicine

## 2023-04-28 VITALS — BP 122/72 | Ht 61.0 in | Wt 161.0 lb

## 2023-04-28 DIAGNOSIS — R2681 Unsteadiness on feet: Secondary | ICD-10-CM

## 2023-04-28 DIAGNOSIS — N3941 Urge incontinence: Secondary | ICD-10-CM | POA: Diagnosis not present

## 2023-04-28 DIAGNOSIS — H353211 Exudative age-related macular degeneration, right eye, with active choroidal neovascularization: Secondary | ICD-10-CM | POA: Diagnosis not present

## 2023-04-28 DIAGNOSIS — R2689 Other abnormalities of gait and mobility: Secondary | ICD-10-CM

## 2023-04-28 NOTE — Progress Notes (Deleted)
Subjective:  Patient ID: Jenna Marshall, female    DOB: 02/27/1926  Age: 89 y.o. MRN: [id number]  CC: {There were no encounter diagnoses. (Refresh or delete this SmartLink)}   HPI Jenna Marshall presents for  Chief Complaint  Patient presents with   discuss paperwork   Faraday is  89 yr old fenae with progressive exudative macular degeneration of right eye,  bilateral DJD knees,  advanced,  and chronic bilateral hip pain with prior left hip fracture,  and usteady gait who was admitted to [facility] after a fall at home on Sept 28.  She was diagnosed with sepsis secondary to COVID pneumonia .  She require prolonged hospitalization and was discharged to SNF on oct 9 to [facility] for reha.  She stayed 28 days at [facility] before returning home.  She has  required a caregive for several hours daily to assist with athing/showering,  meal preparation    She has a caregiver who provides daily assitance from 3 pm to 8 pm 7 days per week   she uses a lifr chair t rise from reclineer  uses a 2 wheeled waker  =  Outpatient Medications Prior to Visit  Medication Sig Dispense Refill   acetaminophen (TYLENOL) 325 MG tablet Take 2 tablets (650 mg total) by mouth every 6 (six) hours as needed for mild pain.     aspirin 81 MG chewable tablet Chew 1 tablet (81 mg total) by mouth daily. 30 tablet 0   atorvastatin (LIPITOR) 40 MG tablet TAKE 1 TABLET(40 MG) BY MOUTH DAILY 90 tablet 3   Calcium Carbonate-Vitamin D 600-200 MG-UNIT TABS Take 1 tablet by mouth 2 (two) times daily.     carvedilol (COREG) 3.125 MG tablet TAKE 1 TABLET(3.125 MG) BY MOUTH TWICE DAILY WITH A MEAL 180 tablet 3   diclofenac Sodium (VOLTAREN) 1 % GEL Apply 2-4 g topically 4 (four) times daily as needed (pain).     docusate sodium (COLACE) 100 MG capsule Take 1 capsule (100 mg total) by mouth 2 (two) times daily.     levothyroxine (SYNTHROID) 25 MCG tablet TAKE 1 TABLET(25 MCG) BY MOUTH DAILY BEFORE BREAKFAST 90 tablet 3   losartan  (COZAAR) 100 MG tablet Take 1 tablet (100 mg total) by mouth daily. 90 tablet 1   mirabegron ER (MYRBETRIQ) 25 MG TB24 tablet Take 1 tablet (25 mg total) by mouth daily. 30 tablet 5   Multiple Vitamin (MULTIVITAMIN WITH MINERALS) TABS tablet Take 1 tablet by mouth daily.     ondansetron (ZOFRAN) 4 MG tablet Take 1 tablet (4 mg total) by mouth every 8 (eight) hours as needed for nausea or vomiting. 20 tablet 0   pantoprazole (PROTONIX) 40 MG tablet TAKE 1 TABLET(40 MG) BY MOUTH DAILY 90 tablet 3   polycarbophil (FIBERCON) 625 MG tablet Take 625 mg by mouth daily.     polyethylene glycol (MIRALAX / GLYCOLAX) 17 g packet Take 17 g by mouth daily.     torsemide (DEMADEX) 10 MG tablet Take 10 mg by mouth as needed. As needed for weight gain     amLODipine (NORVASC) 10 MG tablet Take 1 tablet (10 mg total) by mouth at bedtime.     hydrALAZINE (APRESOLINE) 50 MG tablet Take 1 tablet (50 mg total) by mouth every 6 (six) hours as needed (SBP >150).     senna-docusate (SENOKOT-S) 8.6-50 MG per tablet Take 1 tablet by mouth 2 (two) times daily. (Patient not taking: Reported on 04/28/2023) 30  tablet 0   No facility-administered medications prior to visit.    Review of Systems;  Patient denies headache, fevers, malaise, unintentional weight loss, skin rash, eye pain, sinus congestion and sinus pain, sore throat, dysphagia,  hemoptysis , cough, dyspnea, wheezing, chest pain, palpitations, orthopnea, edema, abdominal pain, nausea, melena, diarrhea, constipation, flank pain, dysuria, hematuria, urinary  Frequency, nocturia, numbness, tingling, seizures,  Focal weakness, Loss of consciousness,  Tremor, insomnia, depression, anxiety, and suicidal ideation.      Objective:  BP 122/72   Ht 5\' 1"  (1.549 m)   Wt 161 lb (73 kg)   BMI 30.42 kg/m   BP Readings from Last 3 Encounters:  04/28/23 122/72  03/12/23 (!) 123/50  02/24/23 (!) 150/80    Wt Readings from Last 3 Encounters:  04/28/23 161 lb (73 kg)   03/08/23 160 lb 11.5 oz (72.9 kg)  02/24/23 158 lb 12.8 oz (72 kg)    Physical Exam  Lab Results  Component Value Date   HGBA1C 6.0 01/25/2022    Lab Results  Component Value Date   CREATININE 0.69 03/07/2023   CREATININE 0.79 03/06/2023   CREATININE 0.66 03/05/2023    Lab Results  Component Value Date   WBC 7.7 03/11/2023   HGB 10.8 (L) 03/11/2023   HCT 31.6 (L) 03/11/2023   PLT 237 03/11/2023   GLUCOSE 100 (H) 03/07/2023   CHOL 140 07/05/2022   TRIG 76.0 07/05/2022   HDL 51.30 07/05/2022   LDLDIRECT 72.0 07/05/2022   LDLCALC 73 07/05/2022   ALT 13 03/05/2023   AST 21 03/05/2023   NA 133 (L) 03/07/2023   K 3.7 03/07/2023   CL 98 03/07/2023   CREATININE 0.69 03/07/2023   BUN 19 03/07/2023   CO2 27 03/07/2023   TSH 3.53 07/05/2022   INR 1.64 (H) 10/14/2014   HGBA1C 6.0 01/25/2022   MICROALBUR <0.7 01/25/2022    ECHOCARDIOGRAM COMPLETE  Result Date: 03/03/2023    ECHOCARDIOGRAM REPORT   Patient Name:   Jenna Marshall Date of Exam: 03/03/2023 Medical Rec #:  [id number]       Height:       61.0 in Accession #:    [id number]      Weight:       158.7 lb Date of Birth:  16-Jan-1926       BSA:          1.712 m Patient Age:    89 years        BP:           175/49 mmHg Patient Gender: F               HR:           64 bpm. Exam Location:  [facility] Procedure: 2D Echo, Cardiac Doppler and Color Doppler Indications:     Murmur R01.1  History:         Patient has prior history of Echocardiogram examinations, most                  recent 02/11/2019. CAD; Risk Factors:Hypertension.  Sonographer:     Cristela Blue Referring Phys:  [id number] Gillis Santa Diagnosing Phys: Lorine Bears MD IMPRESSIONS  1. Left ventricular ejection fraction, by estimation, is 55 to 60%. The left ventricle has normal function. The left ventricle has no regional wall motion abnormalities. There is moderate left ventricular hypertrophy. Left ventricular diastolic parameters are consistent with Grade II diastolic  dysfunction (pseudonormalization).  2. Right ventricular systolic  function is normal. The right ventricular size is normal.  3. Left atrial size was mildly dilated.  4. The mitral valve is normal in structure. Mild to moderate mitral valve regurgitation. No evidence of mitral stenosis. Moderate mitral annular calcification.  5. Tricuspid valve regurgitation is mild to moderate.  6. The aortic valve is calcified. Aortic valve regurgitation is mild. Aortic valve sclerosis/calcification is present, without any evidence of aortic stenosis.  7. The inferior vena cava is normal in size with greater than 50% respiratory variability, suggesting right atrial pressure of 3 mmHg. FINDINGS  Left Ventricle: Left ventricular ejection fraction, by estimation, is 55 to 60%. The left ventricle has normal function. The left ventricle has no regional wall motion abnormalities. The left ventricular internal cavity size was normal in size. There is  moderate left ventricular hypertrophy. Left ventricular diastolic parameters are consistent with Grade II diastolic dysfunction (pseudonormalization). Right Ventricle: The right ventricular size is normal. No increase in right ventricular wall thickness. Right ventricular systolic function is normal. Left Atrium: Left atrial size was mildly dilated. Right Atrium: Right atrial size was normal in size. Pericardium: There is no evidence of pericardial effusion. Mitral Valve: The mitral valve is normal in structure. There is moderate thickening of the mitral valve leaflet(s). There is moderate calcification of the mitral valve leaflet(s). Moderate mitral annular calcification. Mild to moderate mitral valve regurgitation. No evidence of mitral valve stenosis. MV peak gradient, 4.2 mmHg. The mean mitral valve gradient is 1.0 mmHg. Tricuspid Valve: The tricuspid valve is normal in structure. Tricuspid valve regurgitation is mild to moderate. No evidence of tricuspid stenosis. Aortic Valve: The  aortic valve is calcified. Aortic valve regurgitation is mild. Aortic valve sclerosis/calcification is present, without any evidence of aortic stenosis. Aortic valve mean gradient measures 6.0 mmHg. Aortic valve peak gradient measures 10.3 mmHg. Aortic valve area, by VTI measures 1.06 cm. Pulmonic Valve: The pulmonic valve was normal in structure. Pulmonic valve regurgitation is trivial. No evidence of pulmonic stenosis. Aorta: The aortic root is normal in size and structure. Venous: The inferior vena cava is normal in size with greater than 50% respiratory variability, suggesting right atrial pressure of 3 mmHg. IAS/Shunts: No atrial level shunt detected by color flow Doppler.  LEFT VENTRICLE PLAX 2D LVIDd:         4.40 cm   Diastology LVIDs:         2.80 cm   LV e' medial:    5.22 cm/s LV PW:         1.50 cm   LV E/e' medial:  20.3 LV IVS:        1.83 cm   LV e' lateral:   7.51 cm/s LVOT diam:     2.00 cm   LV E/e' lateral: 14.1 LV SV:         37 LV SV Index:   21 LVOT Area:     3.14 cm  RIGHT VENTRICLE RV Basal diam:  3.20 cm RV Mid diam:    2.90 cm RV S prime:     12.20 cm/s TAPSE (M-mode): 1.7 cm LEFT ATRIUM             Index        RIGHT ATRIUM           Index LA diam:        4.30 cm 2.51 cm/m   RA Area:     14.50 cm LA Vol (A2C):   75.6 ml 44.16 ml/m  RA Volume:   33.50 ml  19.57 ml/m LA Vol (A4C):   73.6 ml 42.99 ml/m LA Biplane Vol: 75.0 ml 43.81 ml/m  AORTIC VALVE AV Area (Vmax):    1.23 cm AV Area (Vmean):   1.10 cm AV Area (VTI):     1.06 cm AV Vmax:           160.67 cm/s AV Vmean:          113.000 cm/s AV VTI:            0.348 m AV Peak Grad:      10.3 mmHg AV Mean Grad:      6.0 mmHg LVOT Vmax:         63.10 cm/s LVOT Vmean:        39.700 cm/s LVOT VTI:          0.117 m LVOT/AV VTI ratio: 0.34  AORTA Ao Root diam: 2.70 cm MITRAL VALVE                TRICUSPID VALVE MV Area (PHT): 5.23 cm     TR Peak grad:   39.4 mmHg MV Area VTI:   1.45 cm     TR Vmax:        314.00 cm/s MV Peak grad:  4.2  mmHg MV Mean grad:  1.0 mmHg     SHUNTS MV Vmax:       1.03 m/s     Systemic VTI:  0.12 m MV Vmean:      47.5 cm/s    Systemic Diam: 2.00 cm MV Decel Time: 145 msec MV E velocity: 106.00 cm/s MV A velocity: 70.70 cm/s MV E/A ratio:  1.50 Lorine Bears MD Electronically signed by Lorine Bears MD Signature Date/Time: 03/03/2023/5:13:37 PM    Final     Assessment & Plan:  .There are no diagnoses linked to this encounter.   I provided 30 minutes of face-to-face time during this encounter reviewing patient's last visit with me, patient's  most recent visit with cardiology,  nephrology,  and neurology,  recent surgical and non surgical procedures, previous  labs and imaging studies, counseling on currently addressed issues,  and post visit ordering to diagnostics and therapeutics .   Follow-up: No follow-ups on file.   Sherlene Shams, MD

## 2023-04-28 NOTE — Telephone Encounter (Signed)
Spoke with pt's daughter and scheduled pt for a virtual visit today at 11:30 per verbal from Dr. Darrick Huntsman

## 2023-04-29 NOTE — Assessment & Plan Note (Signed)
Episodes of incontinence are complicated by mobility  issues

## 2023-04-29 NOTE — Assessment & Plan Note (Addendum)
Complicated by decresaed vision in right eye,  Secondary to bilateral knee and hip pain and stooped posture.  She is using a walker

## 2023-04-29 NOTE — Assessment & Plan Note (Signed)
Aggravated by recent illness

## 2023-04-29 NOTE — Assessment & Plan Note (Signed)
Exudative,  right eye ,  progressive despite treatment.  Her vision is poor in the right eye

## 2023-04-29 NOTE — Progress Notes (Signed)
Virtual Visit via Caregility   Note   This format is felt to be most appropriate for this patient at this time.  All issues noted in this document were discussed and addressed.  No physical exam was performed (except for noted visual exam findings with Video Visits).   I connected with Jenna Marshall  on 04/28/23 at 11:30 AM EST by a video enabled telemedicine application and verified that I am speaking with the correct person using two identifiers. Location patient: home Location provider: work or home office Persons participating in the virtual visit: patient, provider  I discussed the limitations, risks, security and privacy concerns of performing an evaluation and management service by telephone and the availability of in person appointments. I also discussed with the patient that there may be a patient responsible charge related to this service. The patient expressed understanding and agreed to proceed.   Reason for visit:  hospital follow up,  disability   HPI:  Jenna Marshall is  89 yr old fenae with progressive exudative macular degeneration of right eye,  bilateral DJD knees,  advanced,  and chronic bilateral hip pain with prior left hip fracture,  and usteady gait who was admitted to [facility] after a fall at home on Sept 28.  She was diagnosed with sepsis secondary to COVID pneumonia .  She require prolonged hospitalization and was discharged to SNF on oct 9 to [facility] for reha.  She stayed 28 days at [facility] before returning home.  She has  required a caregive for several hours daily to assist with athing/showering,  meal preparation    She has a caregiver who provides daily assistance from 3 pm to 8 pm 5 days per week ; her family fills in on the weekends.  She uses a lift chair chair to help her rise from her recliner.  .  She  uses a 2 wheeled waker    ROS: See pertinent positives and negatives per HPI.  Past Medical History:  Diagnosis Date   Aortic insufficiency    a. 03/2017 Echo:  Mod AI; b. 02/2019 Echo: Mod AI.   Arthritis    CAD (coronary artery disease)    a. Lexiscan 10/13/14: mid anterior to apical & inf wall ischemia w/ WMA, mild to mod dep EF; b. Cath 10/2014: LM 50ost, LM 99, ost LAD 95, 40p/m, RCA 30p, 52m; b. 10/2014 CABG x 4 (LIMA->LAD, VG->RCA, VG->RI->OM).   Depression    Diastolic dysfunction    a. 10/2014 Echo: EF 55-60%, no RWMA, GR1DD; b. 03/2017 Echo: EF 60-65%, mild LVH, Gr2 DD; c. 02/2019 Echo: EF 55-60%, sev LVH. Diast dysfxn. No rwma. RVSP 41.66mmHg. Mildly dil LA. Mild to mod MR. Mod TR. Mod AI.   GERD (gastroesophageal reflux disease)    Hyperlipidemia    Hypertension    Hypothyroidism    Macular degeneration    pernicious anemia    Varicose veins    Wears dentures    partial upper   Wears hearing aid    right    Past Surgical History:  Procedure Laterality Date   ANTERIOR VITRECTOMY Right 07/16/2016   Procedure: ANTERIOR VITRECTOMY;  Surgeon: Nevada Crane, MD;  Location: [facility];  Service: Ophthalmology;  Laterality: Right;   CARDIAC CATHETERIZATION Left 10/14/2014   Procedure: Left Heart Cath and Coronary Angiography;  Surgeon: Iran Ouch, MD;  Location: [facility] INVASIVE CV LAB;  Service: Cardiovascular;  Laterality: Left;   CATARACT EXTRACTION W/PHACO Left 04/30/2016   Procedure: CATARACT  EXTRACTION PHACO AND INTRAOCULAR LENS PLACEMENT (IOC);  Surgeon: Nevada Crane, MD;  Location: [facility];  Service: Ophthalmology;  Laterality: Left;  LEFT   CATARACT EXTRACTION W/PHACO Right 07/16/2016   Procedure: CATARACT EXTRACTION PHACO AND INTRAOCULAR LENS PLACEMENT (IOC)  Right;  Surgeon: Nevada Crane, MD;  Location: [facility];  Service: Ophthalmology;  Laterality: Right;   CORONARY ARTERY BYPASS GRAFT N/A 10/14/2014   Procedure: CORONARY ARTERY BYPASS GRAFTING (CABG), ON PUMP, TIMES FOUR, USING LEFT INTERNAL MAMMARY ARTERY, RIGHT GREATER SAPHENOUS VEIN HARVESTED ENDOSCOPICALLY;  Surgeon: Alleen Borne,  MD;  Location: [facility] OR;  Service: Open Heart Surgery;  Laterality: N/A;   CORONARY ARTERY BYPASS GRAFT  10/2014   LIMA-->LAD, SVG-->RCA, sequential SVG-->Ramus and OM   HEMORRHOID SURGERY     HIP ARTHROPLASTY Left 01/14/2015   Procedure: ARTHROPLASTY BIPOLAR HIP (HEMIARTHROPLASTY);  Surgeon: Donato Heinz, MD;  Location: [facility] ORS;  Service: Orthopedics;  Laterality: Left;   VARICOSE VEIN SURGERY      Family History  Problem Relation Age of Onset   Coronary artery disease Mother    Diabetes Mother    Heart disease Father    Coronary artery disease Brother    Breast cancer Neg Hx     SOCIAL HX:  reports that she has never smoked. She has never used smokeless tobacco. She reports that she does not drink alcohol and does not use drugs.    Current Outpatient Medications:    acetaminophen (TYLENOL) 325 MG tablet, Take 2 tablets (650 mg total) by mouth every 6 (six) hours as needed for mild pain., Disp: , Rfl:    aspirin 81 MG chewable tablet, Chew 1 tablet (81 mg total) by mouth daily., Disp: 30 tablet, Rfl: 0   atorvastatin (LIPITOR) 40 MG tablet, TAKE 1 TABLET(40 MG) BY MOUTH DAILY, Disp: 90 tablet, Rfl: 3   Calcium Carbonate-Vitamin D 600-200 MG-UNIT TABS, Take 1 tablet by mouth 2 (two) times daily., Disp: , Rfl:    carvedilol (COREG) 3.125 MG tablet, TAKE 1 TABLET(3.125 MG) BY MOUTH TWICE DAILY WITH A MEAL, Disp: 180 tablet, Rfl: 3   diclofenac Sodium (VOLTAREN) 1 % GEL, Apply 2-4 g topically 4 (four) times daily as needed (pain)., Disp: , Rfl:    docusate sodium (COLACE) 100 MG capsule, Take 1 capsule (100 mg total) by mouth 2 (two) times daily., Disp: , Rfl:    levothyroxine (SYNTHROID) 25 MCG tablet, TAKE 1 TABLET(25 MCG) BY MOUTH DAILY BEFORE BREAKFAST, Disp: 90 tablet, Rfl: 3   losartan (COZAAR) 100 MG tablet, Take 1 tablet (100 mg total) by mouth daily., Disp: 90 tablet, Rfl: 1   mirabegron ER (MYRBETRIQ) 25 MG TB24 tablet, Take 1 tablet (25 mg total) by mouth daily., Disp: 30 tablet,  Rfl: 5   Multiple Vitamin (MULTIVITAMIN WITH MINERALS) TABS tablet, Take 1 tablet by mouth daily., Disp: , Rfl:    ondansetron (ZOFRAN) 4 MG tablet, Take 1 tablet (4 mg total) by mouth every 8 (eight) hours as needed for nausea or vomiting., Disp: 20 tablet, Rfl: 0   pantoprazole (PROTONIX) 40 MG tablet, TAKE 1 TABLET(40 MG) BY MOUTH DAILY, Disp: 90 tablet, Rfl: 3   polycarbophil (FIBERCON) 625 MG tablet, Take 625 mg by mouth daily., Disp: , Rfl:    polyethylene glycol (MIRALAX / GLYCOLAX) 17 g packet, Take 17 g by mouth daily., Disp: , Rfl:    torsemide (DEMADEX) 10 MG tablet, Take 10 mg by mouth as needed. As needed for weight  gain, Disp: , Rfl:   EXAM:  VITALS per patient if applicable:  GENERAL: alert, oriented, appears well and in no acute distress  HEENT: atraumatic, conjunttiva clear, no obvious abnormalities on inspection of external nose and ears  NECK: normal movements of the head and neck  LUNGS: on inspection no signs of respiratory distress, breathing rate appears normal, no obvious gross SOB, gasping or wheezing  CV: no obvious cyanosis  MS: moves all visible extremities without noticeable abnormality  PSYCH/NEURO: pleasant and cooperative, no obvious depression or anxiety, speech and thought processing grossly intact  ASSESSMENT AND PLAN: Unsteady gait when walking Assessment & Plan: Complicated by decresaed vision in right eye,  Secondary to bilateral knee and hip pain and stooped posture.  She is using a walker   Urinary incontinence, urge Assessment & Plan: Episodes of incontinence are complicated by mobility  issues   Exudative age-related macular degeneration of right eye with active choroidal neovascularization (HCC) Assessment & Plan: Exudative,  right eye ,  progressive despite treatment.  Her vision is poor in the right eye    Balance problem Assessment & Plan: Aggravated by recent illness        I discussed the assessment and treatment plan  with the patient. The patient was provided an opportunity to ask questions and all were answered. The patient agreed with the plan and demonstrated an understanding of the instructions.   The patient was advised to call back or seek an in-person evaluation if the symptoms worsen or if the condition fails to improve as anticipated.   I spent 30 minutes dedicated to the care of this patient on the date of this encounter to include pre-visit review of his medical history,  Face-to-face time with the patient , and post visit ordering of testing and therapeutics.    Sherlene Shams, MD

## 2023-04-30 DIAGNOSIS — I8390 Asymptomatic varicose veins of unspecified lower extremity: Secondary | ICD-10-CM | POA: Diagnosis not present

## 2023-04-30 DIAGNOSIS — U071 COVID-19: Secondary | ICD-10-CM | POA: Diagnosis not present

## 2023-04-30 DIAGNOSIS — I2510 Atherosclerotic heart disease of native coronary artery without angina pectoris: Secondary | ICD-10-CM | POA: Diagnosis not present

## 2023-04-30 DIAGNOSIS — I351 Nonrheumatic aortic (valve) insufficiency: Secondary | ICD-10-CM | POA: Diagnosis not present

## 2023-04-30 DIAGNOSIS — H3530 Unspecified macular degeneration: Secondary | ICD-10-CM | POA: Diagnosis not present

## 2023-04-30 DIAGNOSIS — I10 Essential (primary) hypertension: Secondary | ICD-10-CM | POA: Diagnosis not present

## 2023-04-30 DIAGNOSIS — J1282 Pneumonia due to coronavirus disease 2019: Secondary | ICD-10-CM | POA: Diagnosis not present

## 2023-04-30 DIAGNOSIS — D510 Vitamin B12 deficiency anemia due to intrinsic factor deficiency: Secondary | ICD-10-CM | POA: Diagnosis not present

## 2023-04-30 DIAGNOSIS — Z51A Encounter for sepsis aftercare: Secondary | ICD-10-CM | POA: Diagnosis not present

## 2023-05-07 DIAGNOSIS — J1282 Pneumonia due to coronavirus disease 2019: Secondary | ICD-10-CM | POA: Diagnosis not present

## 2023-05-07 DIAGNOSIS — I10 Essential (primary) hypertension: Secondary | ICD-10-CM | POA: Diagnosis not present

## 2023-05-07 DIAGNOSIS — H3530 Unspecified macular degeneration: Secondary | ICD-10-CM | POA: Diagnosis not present

## 2023-05-07 DIAGNOSIS — Z51A Encounter for sepsis aftercare: Secondary | ICD-10-CM | POA: Diagnosis not present

## 2023-05-07 DIAGNOSIS — I8390 Asymptomatic varicose veins of unspecified lower extremity: Secondary | ICD-10-CM | POA: Diagnosis not present

## 2023-05-07 DIAGNOSIS — D510 Vitamin B12 deficiency anemia due to intrinsic factor deficiency: Secondary | ICD-10-CM | POA: Diagnosis not present

## 2023-05-07 DIAGNOSIS — I351 Nonrheumatic aortic (valve) insufficiency: Secondary | ICD-10-CM | POA: Diagnosis not present

## 2023-05-07 DIAGNOSIS — U071 COVID-19: Secondary | ICD-10-CM | POA: Diagnosis not present

## 2023-05-07 DIAGNOSIS — I2510 Atherosclerotic heart disease of native coronary artery without angina pectoris: Secondary | ICD-10-CM | POA: Diagnosis not present

## 2023-05-14 DIAGNOSIS — Z51A Encounter for sepsis aftercare: Secondary | ICD-10-CM | POA: Diagnosis not present

## 2023-05-14 DIAGNOSIS — I2510 Atherosclerotic heart disease of native coronary artery without angina pectoris: Secondary | ICD-10-CM | POA: Diagnosis not present

## 2023-05-14 DIAGNOSIS — I351 Nonrheumatic aortic (valve) insufficiency: Secondary | ICD-10-CM | POA: Diagnosis not present

## 2023-05-14 DIAGNOSIS — I10 Essential (primary) hypertension: Secondary | ICD-10-CM | POA: Diagnosis not present

## 2023-05-14 DIAGNOSIS — H3530 Unspecified macular degeneration: Secondary | ICD-10-CM | POA: Diagnosis not present

## 2023-05-14 DIAGNOSIS — D510 Vitamin B12 deficiency anemia due to intrinsic factor deficiency: Secondary | ICD-10-CM | POA: Diagnosis not present

## 2023-05-14 DIAGNOSIS — I8390 Asymptomatic varicose veins of unspecified lower extremity: Secondary | ICD-10-CM | POA: Diagnosis not present

## 2023-05-14 DIAGNOSIS — U071 COVID-19: Secondary | ICD-10-CM | POA: Diagnosis not present

## 2023-05-14 DIAGNOSIS — J1282 Pneumonia due to coronavirus disease 2019: Secondary | ICD-10-CM | POA: Diagnosis not present

## 2023-05-15 ENCOUNTER — Ambulatory Visit: Payer: Medicare HMO | Admitting: Cardiovascular Disease

## 2023-05-21 DIAGNOSIS — H353211 Exudative age-related macular degeneration, right eye, with active choroidal neovascularization: Secondary | ICD-10-CM | POA: Diagnosis not present

## 2023-06-03 ENCOUNTER — Other Ambulatory Visit: Payer: Self-pay | Admitting: Internal Medicine

## 2023-07-16 DIAGNOSIS — H353211 Exudative age-related macular degeneration, right eye, with active choroidal neovascularization: Secondary | ICD-10-CM | POA: Diagnosis not present

## 2023-07-31 ENCOUNTER — Ambulatory Visit: Payer: Medicare HMO | Admitting: Cardiovascular Disease

## 2023-08-14 ENCOUNTER — Ambulatory Visit: Payer: Medicare HMO | Admitting: Medical

## 2023-08-25 ENCOUNTER — Encounter: Payer: Self-pay | Admitting: Internal Medicine

## 2023-08-25 ENCOUNTER — Ambulatory Visit (INDEPENDENT_AMBULATORY_CARE_PROVIDER_SITE_OTHER): Payer: Medicare HMO | Admitting: Internal Medicine

## 2023-08-25 VITALS — BP 134/76 | HR 64 | Ht 61.0 in | Wt 155.8 lb

## 2023-08-25 DIAGNOSIS — M25552 Pain in left hip: Secondary | ICD-10-CM | POA: Diagnosis not present

## 2023-08-25 DIAGNOSIS — R2681 Unsteadiness on feet: Secondary | ICD-10-CM | POA: Diagnosis not present

## 2023-08-25 DIAGNOSIS — I10 Essential (primary) hypertension: Secondary | ICD-10-CM

## 2023-08-25 DIAGNOSIS — N3941 Urge incontinence: Secondary | ICD-10-CM | POA: Diagnosis not present

## 2023-08-25 DIAGNOSIS — I2510 Atherosclerotic heart disease of native coronary artery without angina pectoris: Secondary | ICD-10-CM | POA: Diagnosis not present

## 2023-08-25 DIAGNOSIS — E039 Hypothyroidism, unspecified: Secondary | ICD-10-CM | POA: Diagnosis not present

## 2023-08-25 DIAGNOSIS — E785 Hyperlipidemia, unspecified: Secondary | ICD-10-CM

## 2023-08-25 DIAGNOSIS — G8929 Other chronic pain: Secondary | ICD-10-CM | POA: Diagnosis not present

## 2023-08-25 LAB — COMPREHENSIVE METABOLIC PANEL
ALT: 11 U/L (ref 0–35)
AST: 18 U/L (ref 0–37)
Albumin: 4.1 g/dL (ref 3.5–5.2)
Alkaline Phosphatase: 59 U/L (ref 39–117)
BUN: 26 mg/dL — ABNORMAL HIGH (ref 6–23)
CO2: 31 meq/L (ref 19–32)
Calcium: 9.5 mg/dL (ref 8.4–10.5)
Chloride: 101 meq/L (ref 96–112)
Creatinine, Ser: 0.9 mg/dL (ref 0.40–1.20)
GFR: 54.27 mL/min — ABNORMAL LOW (ref >60.00)
Glucose, Bld: 95 mg/dL (ref 70–99)
Potassium: 4.5 meq/L (ref 3.5–5.1)
Sodium: 138 meq/L (ref 135–145)
Total Bilirubin: 0.4 mg/dL (ref 0.2–1.2)
Total Protein: 6.9 g/dL (ref 6.0–8.3)

## 2023-08-25 LAB — MICROALBUMIN / CREATININE URINE RATIO
Creatinine,U: 57 mg/dL
Microalb Creat Ratio: 20 mg/g (ref 0.0–30.0)
Microalb, Ur: 1.1 mg/dL (ref 0.0–1.9)

## 2023-08-25 LAB — LIPID PANEL
Cholesterol: 126 mg/dL (ref 0–200)
HDL: 51.1 mg/dL
LDL Cholesterol: 59 mg/dL (ref 0–99)
NonHDL: 75.29
Total CHOL/HDL Ratio: 2
Triglycerides: 83 mg/dL (ref 0.0–149.0)
VLDL: 16.6 mg/dL (ref 0.0–40.0)

## 2023-08-25 LAB — HEMOGLOBIN A1C: Hgb A1c MFr Bld: 5.9 % (ref 4.6–6.5)

## 2023-08-25 LAB — LDL CHOLESTEROL, DIRECT: Direct LDL: 60 mg/dL

## 2023-08-25 MED ORDER — MIRABEGRON ER 25 MG PO TB24: 25.0000 mg | ORAL_TABLET | Freq: Every day | ORAL | 1 refills | Status: DC

## 2023-08-25 MED ORDER — CARVEDILOL 3.125 MG PO TABS: ORAL_TABLET | ORAL | 1 refills | Status: DC

## 2023-08-25 MED ORDER — LOSARTAN POTASSIUM 100 MG PO TABS: 100.0000 mg | ORAL_TABLET | Freq: Every day | ORAL | 1 refills | Status: DC

## 2023-08-25 NOTE — Assessment & Plan Note (Signed)
 Secondary to prior fracture.  Advised to increase total daily dose of Tylenol  to 2000 mg

## 2023-08-25 NOTE — Assessment & Plan Note (Signed)
 she reports compliance with medication regimen and home readings have been 130/80 or less .  No changes ,  continue coreg/losartan

## 2023-08-25 NOTE — Progress Notes (Signed)
 Subjective:  Patient ID: Jenna Marshall, female    DOB: 07-23-1925  Age: 89 y.o. MRN: [id number]  CC: The primary encounter diagnosis was Acquired hypothyroidism. Diagnoses of Hyperlipidemia, unspecified hyperlipidemia type, Essential hypertension, Urinary incontinence, urge, Elevated blood pressure reading with diagnosis of hypertension, Chronic left hip pain, Coronary artery disease involving native coronary artery of native heart without angina pectoris, and Unsteady gait when walking were also pertinent to this visit.   HPI [name] presents for  Chief Complaint  Patient presents with   Medical Management of Chronic Issues    6 month follow up    Chronic issues  .  Accompanied by daughter Lynden Ang.   OAB:  currently managed with Mybetriq , but "the cost went up" and is too expensive.  Prior trials of Detrol LA and oxybutynin were not tolerated or too expensive  Joint pain: using tylenol < 2000 mg daily.  Stays inside most of the time  CAD:  she has been asymptomatic and leads a largely sedentary life.  Wants to stop seeing cardiology if possible to minimize copays.      Outpatient Medications Prior to Visit  Medication Sig Dispense Refill   acetaminophen (TYLENOL) 325 MG tablet Take 2 tablets (650 mg total) by mouth every 6 (six) hours as needed for mild pain.     aspirin 81 MG chewable tablet Chew 1 tablet (81 mg total) by mouth daily. 30 tablet 0   atorvastatin (LIPITOR) 40 MG tablet TAKE 1 TABLET(40 MG) BY MOUTH DAILY 90 tablet 3   Calcium Carbonate-Vitamin D 600-200 MG-UNIT TABS Take 1 tablet by mouth 2 (two) times daily.     diclofenac Sodium (VOLTAREN) 1 % GEL Apply 2-4 g topically 4 (four) times daily as needed (pain).     docusate sodium (COLACE) 100 MG capsule Take 1 capsule (100 mg total) by mouth 2 (two) times daily.     levothyroxine (SYNTHROID) 25 MCG tablet TAKE 1 TABLET(25 MCG) BY MOUTH DAILY BEFORE BREAKFAST 90 tablet 3   Multiple Vitamin (MULTIVITAMIN  WITH MINERALS) TABS tablet Take 1 tablet by mouth daily.     ondansetron (ZOFRAN) 4 MG tablet Take 1 tablet (4 mg total) by mouth every 8 (eight) hours as needed for nausea or vomiting. 20 tablet 0   pantoprazole (PROTONIX) 40 MG tablet TAKE 1 TABLET(40 MG) BY MOUTH DAILY 90 tablet 3   polycarbophil (FIBERCON) 625 MG tablet Take 625 mg by mouth daily.     polyethylene glycol (MIRALAX / GLYCOLAX) 17 g packet Take 17 g by mouth daily.     torsemide (DEMADEX) 10 MG tablet TAKE 1 TABLET BY MOUTH EVERY DAY AS NEEDED FOR SHORTNESS OF BREATH OR WEIGHT GAIN 3 LBS OR GREATER OVERNIGHT 30 tablet 0   carvedilol (COREG) 3.125 MG tablet TAKE 1 TABLET(3.125 MG) BY MOUTH TWICE DAILY WITH A MEAL 180 tablet 3   losartan (COZAAR) 100 MG tablet Take 1 tablet (100 mg total) by mouth daily. 90 tablet 1   mirabegron ER (MYRBETRIQ) 25 MG TB24 tablet Take 1 tablet (25 mg total) by mouth daily. (Patient not taking: Reported on 08/25/2023) 30 tablet 5   No facility-administered medications prior to visit.    Review of Systems;  Patient denies headache, fevers, malaise, unintentional weight loss, skin rash, eye pain, sinus congestion and sinus pain, sore throat, dysphagia,  hemoptysis , cough, dyspnea, wheezing, chest pain, palpitations, orthopnea, edema, abdominal pain, nausea, melena, diarrhea, constipation, flank pain, dysuria, hematuria, urinary  Frequency,  nocturia, numbness, tingling, seizures,  Focal weakness, Loss of consciousness,  Tremor, insomnia, depression, anxiety, and suicidal ideation.      Objective:  BP 134/76   Pulse 64   Ht 5\' 1"  (1.549 m)   Wt 155 lb 12.8 oz (70.7 kg)   SpO2 96%   BMI 29.44 kg/m   BP Readings from Last 3 Encounters:  08/25/23 134/76  04/28/23 122/72  03/12/23 (!) 123/50    Wt Readings from Last 3 Encounters:  08/25/23 155 lb 12.8 oz (70.7 kg)  04/28/23 161 lb (73 kg)  03/08/23 160 lb 11.5 oz (72.9 kg)    Physical Exam Vitals reviewed.  Constitutional:       General: She is not in acute distress.    Appearance: Normal appearance. She is normal weight. She is not ill-appearing, toxic-appearing or diaphoretic.  HENT:     Head: Normocephalic.  Eyes:     General: No scleral icterus.       Right eye: No discharge.        Left eye: No discharge.     Conjunctiva/sclera: Conjunctivae normal.  Cardiovascular:     Rate and Rhythm: Normal rate and regular rhythm.     Heart sounds: Murmur heard.  Pulmonary:     Effort: Pulmonary effort is normal. No respiratory distress.     Breath sounds: Normal breath sounds.  Abdominal:     General: Bowel sounds are normal.     Palpations: Abdomen is soft.  Musculoskeletal:        General: Normal range of motion.  Skin:    General: Skin is warm and dry.  Neurological:     General: No focal deficit present.     Mental Status: She is alert and oriented to person, place, and time. Mental status is at baseline.  Psychiatric:        Mood and Affect: Mood normal.        Behavior: Behavior normal.        Thought Content: Thought content normal.        Judgment: Judgment normal.    Lab Results  Component Value Date   HGBA1C 6.0 01/25/2022    Lab Results  Component Value Date   CREATININE 0.69 03/07/2023   CREATININE 0.79 03/06/2023   CREATININE 0.66 03/05/2023    Lab Results  Component Value Date   WBC 7.7 03/11/2023   HGB 10.8 (L) 03/11/2023   HCT 31.6 (L) 03/11/2023   PLT 237 03/11/2023   GLUCOSE 100 (H) 03/07/2023   CHOL 140 07/05/2022   TRIG 76.0 07/05/2022   HDL 51.30 07/05/2022   LDLDIRECT 72.0 07/05/2022   LDLCALC 73 07/05/2022   ALT 13 03/05/2023   AST 21 03/05/2023   NA 133 (L) 03/07/2023   K 3.7 03/07/2023   CL 98 03/07/2023   CREATININE 0.69 03/07/2023   BUN 19 03/07/2023   CO2 27 03/07/2023   TSH 3.53 07/05/2022   INR 1.64 (H) 10/14/2014   HGBA1C 6.0 01/25/2022   MICROALBUR <0.7 01/25/2022    ECHOCARDIOGRAM COMPLETE Result Date: 03/03/2023    ECHOCARDIOGRAM REPORT    Patient Name:   Jenna Marshall Date of Exam: 03/03/2023 Medical Rec #:  [id number]       Height:       61.0 in Accession #:    [id number]      Weight:       158.7 lb Date of Birth:  Nov 02, 1925       BSA:  1.712 m Patient Age:    89 years        BP:           175/49 mmHg Patient Gender: F               HR:           64 bpm. Exam Location:  [facility] Procedure: 2D Echo, Cardiac Doppler and Color Doppler Indications:     Murmur R01.1  History:         Patient has prior history of Echocardiogram examinations, most                  recent 02/11/2019. CAD; Risk Factors:Hypertension.  Sonographer:     Cristela Blue Referring Phys:  [id number] Gillis Santa Diagnosing Phys: Lorine Bears MD IMPRESSIONS  1. Left ventricular ejection fraction, by estimation, is 55 to 60%. The left ventricle has normal function. The left ventricle has no regional wall motion abnormalities. There is moderate left ventricular hypertrophy. Left ventricular diastolic parameters are consistent with Grade II diastolic dysfunction (pseudonormalization).  2. Right ventricular systolic function is normal. The right ventricular size is normal.  3. Left atrial size was mildly dilated.  4. The mitral valve is normal in structure. Mild to moderate mitral valve regurgitation. No evidence of mitral stenosis. Moderate mitral annular calcification.  5. Tricuspid valve regurgitation is mild to moderate.  6. The aortic valve is calcified. Aortic valve regurgitation is mild. Aortic valve sclerosis/calcification is present, without any evidence of aortic stenosis.  7. The inferior vena cava is normal in size with greater than 50% respiratory variability, suggesting right atrial pressure of 3 mmHg. FINDINGS  Left Ventricle: Left ventricular ejection fraction, by estimation, is 55 to 60%. The left ventricle has normal function. The left ventricle has no regional wall motion abnormalities. The left ventricular internal cavity size was normal in size. There is  moderate  left ventricular hypertrophy. Left ventricular diastolic parameters are consistent with Grade II diastolic dysfunction (pseudonormalization). Right Ventricle: The right ventricular size is normal. No increase in right ventricular wall thickness. Right ventricular systolic function is normal. Left Atrium: Left atrial size was mildly dilated. Right Atrium: Right atrial size was normal in size. Pericardium: There is no evidence of pericardial effusion. Mitral Valve: The mitral valve is normal in structure. There is moderate thickening of the mitral valve leaflet(s). There is moderate calcification of the mitral valve leaflet(s). Moderate mitral annular calcification. Mild to moderate mitral valve regurgitation. No evidence of mitral valve stenosis. MV peak gradient, 4.2 mmHg. The mean mitral valve gradient is 1.0 mmHg. Tricuspid Valve: The tricuspid valve is normal in structure. Tricuspid valve regurgitation is mild to moderate. No evidence of tricuspid stenosis. Aortic Valve: The aortic valve is calcified. Aortic valve regurgitation is mild. Aortic valve sclerosis/calcification is present, without any evidence of aortic stenosis. Aortic valve mean gradient measures 6.0 mmHg. Aortic valve peak gradient measures 10.3 mmHg. Aortic valve area, by VTI measures 1.06 cm. Pulmonic Valve: The pulmonic valve was normal in structure. Pulmonic valve regurgitation is trivial. No evidence of pulmonic stenosis. Aorta: The aortic root is normal in size and structure. Venous: The inferior vena cava is normal in size with greater than 50% respiratory variability, suggesting right atrial pressure of 3 mmHg. IAS/Shunts: No atrial level shunt detected by color flow Doppler.  LEFT VENTRICLE PLAX 2D LVIDd:         4.40 cm   Diastology LVIDs:  2.80 cm   LV e' medial:    5.22 cm/s LV PW:         1.50 cm   LV E/e' medial:  20.3 LV IVS:        1.83 cm   LV e' lateral:   7.51 cm/s LVOT diam:     2.00 cm   LV E/e' lateral: 14.1 LV SV:          37 LV SV Index:   21 LVOT Area:     3.14 cm  RIGHT VENTRICLE RV Basal diam:  3.20 cm RV Mid diam:    2.90 cm RV S prime:     12.20 cm/s TAPSE (M-mode): 1.7 cm LEFT ATRIUM             Index        RIGHT ATRIUM           Index LA diam:        4.30 cm 2.51 cm/m   RA Area:     14.50 cm LA Vol (A2C):   75.6 ml 44.16 ml/m  RA Volume:   33.50 ml  19.57 ml/m LA Vol (A4C):   73.6 ml 42.99 ml/m LA Biplane Vol: 75.0 ml 43.81 ml/m  AORTIC VALVE AV Area (Vmax):    1.23 cm AV Area (Vmean):   1.10 cm AV Area (VTI):     1.06 cm AV Vmax:           160.67 cm/s AV Vmean:          113.000 cm/s AV VTI:            0.348 m AV Peak Grad:      10.3 mmHg AV Mean Grad:      6.0 mmHg LVOT Vmax:         63.10 cm/s LVOT Vmean:        39.700 cm/s LVOT VTI:          0.117 m LVOT/AV VTI ratio: 0.34  AORTA Ao Root diam: 2.70 cm MITRAL VALVE                TRICUSPID VALVE MV Area (PHT): 5.23 cm     TR Peak grad:   39.4 mmHg MV Area VTI:   1.45 cm     TR Vmax:        314.00 cm/s MV Peak grad:  4.2 mmHg MV Mean grad:  1.0 mmHg     SHUNTS MV Vmax:       1.03 m/s     Systemic VTI:  0.12 m MV Vmean:      47.5 cm/s    Systemic Diam: 2.00 cm MV Decel Time: 145 msec MV E velocity: 106.00 cm/s MV A velocity: 70.70 cm/s MV E/A ratio:  1.50 Lorine Bears MD Electronically signed by Lorine Bears MD Signature Date/Time: 03/03/2023/5:13:37 PM    Final     Assessment & Plan:  .Acquired hypothyroidism Assessment & Plan: Continue 25 mcg until TSH can be rechecked today  Orders: -     TSH  Hyperlipidemia, unspecified hyperlipidemia type -     Lipid panel -     LDL cholesterol, direct -     Hemoglobin A1c  Essential hypertension -     Comprehensive metabolic panel -     Microalbumin / creatinine urine ratio  Urinary incontinence, urge Assessment & Plan: Reviewed prior trials of other bladder agents which were not tolerated.  Reminded daughter that the medication cost will  likely come down after the first refill    Elevated  blood pressure reading with diagnosis of hypertension Assessment & Plan: she reports compliance with medication regimen and home readings have been 130/80 or less .  No changes ,  continue coreg/losartan   Chronic left hip pain Assessment & Plan: Secondary to prior fracture.  Advised to increase total daily dose of Tylenol  to 2000 mg    Coronary artery disease involving native coronary artery of native heart without angina pectoris Assessment & Plan: She has been asymptomatic  and declines continued follow up with cardiology .   Most recent ECHO Sept 2024 during hospitalization for COVID pneumonia  :  normal LVEF.  No significant valvular abnormalities.   Cont asa/ coreg/ lipitor.   Unsteady gait when walking Assessment & Plan: Complicated by decresaed vision in right eye,  Secondary to bilateral knee and hip pain and stooped posture.  She is using a walker and has completed PT.  She has an aide at home and fdes not venture out of  the home without assistance/    Other orders -     Carvedilol; TAKE 1 TABLET(3.125 MG) BY MOUTH TWICE DAILY WITH A MEAL  Dispense: 180 tablet; Refill: 1 -     Losartan Potassium; Take 1 tablet (100 mg total) by mouth daily.  Dispense: 90 tablet; Refill: 1 -     Mirabegron ER; Take 1 tablet (25 mg total) by mouth daily.  Dispense: 90 tablet; Refill: 1     I spent 34 minutes on the day of this face to face encounter reviewing patient's  most recent visit with cardiology,  nprior relevant surgical and non surgical procedures, recent  labs and imaging studies, counseling on pain management,  reviewing the assessment and plan with patient, and post visit ordering and reviewing of  diagnostics and therapeutics with patient  .   Follow-up: No follow-ups on file.   Sherlene Shams, MD

## 2023-08-25 NOTE — Patient Instructions (Addendum)
  For your arthritis pain :   You can add up to 2000 mg of acetominophen (tylenol) every day safely  In divided doses ( 1000 mg every 12 hours.)    Medication costs are being charged to Medicare patients "upfront "  at the beginning of the year instead of having the dreaded "donut hole" period.  This is why your drug costs have gone up.  There are two ways to get around this:  1)  call your  Medicare  part D insurer to enroll in the plan that can take the drug costs and spread it out  over the entire (or remaining ) year.    2)  call [employer] ([place] [employer] and [employer])  and apply for Medication assistance program [phone] to qualify   .[email or web address]

## 2023-08-25 NOTE — Assessment & Plan Note (Addendum)
 She has been asymptomatic  and declines continued follow up with cardiology .   Most recent ECHO Sept 2024 during hospitalization for COVID pneumonia  :  normal LVEF.  No significant valvular abnormalities.   Cont asa/ coreg/ lipitor.

## 2023-08-25 NOTE — Assessment & Plan Note (Signed)
 Continue 25 mcg until TSH can be rechecked today

## 2023-08-25 NOTE — Assessment & Plan Note (Signed)
 Complicated by decresaed vision in right eye,  Secondary to bilateral knee and hip pain and stooped posture.  She is using a walker and has completed PT.  She has an aide at home and fdes not venture out of  the home without assistance/

## 2023-08-25 NOTE — Assessment & Plan Note (Signed)
 Reviewed prior trials of other bladder agents which were not tolerated.  Reminded daughter that the medication cost will likely come down after the first refill

## 2023-08-27 ENCOUNTER — Encounter: Payer: Self-pay | Admitting: Internal Medicine

## 2023-08-27 LAB — TSH: TSH: 2.59 u[IU]/mL (ref 0.35–5.50)

## 2023-09-17 DIAGNOSIS — H353211 Exudative age-related macular degeneration, right eye, with active choroidal neovascularization: Secondary | ICD-10-CM | POA: Diagnosis not present

## 2023-09-25 ENCOUNTER — Ambulatory Visit: Admitting: Medical

## 2023-09-27 ENCOUNTER — Other Ambulatory Visit: Payer: Self-pay | Admitting: Internal Medicine

## 2023-10-03 DIAGNOSIS — F0283 Dementia in other diseases classified elsewhere, unspecified severity, with mood disturbance: Secondary | ICD-10-CM | POA: Diagnosis not present

## 2023-10-03 DIAGNOSIS — I509 Heart failure, unspecified: Secondary | ICD-10-CM | POA: Diagnosis not present

## 2023-10-03 DIAGNOSIS — E039 Hypothyroidism, unspecified: Secondary | ICD-10-CM | POA: Diagnosis not present

## 2023-10-03 DIAGNOSIS — F0284 Dementia in other diseases classified elsewhere, unspecified severity, with anxiety: Secondary | ICD-10-CM | POA: Diagnosis not present

## 2023-10-14 DIAGNOSIS — F0283 Dementia in other diseases classified elsewhere, unspecified severity, with mood disturbance: Secondary | ICD-10-CM | POA: Diagnosis not present

## 2023-10-14 DIAGNOSIS — K219 Gastro-esophageal reflux disease without esophagitis: Secondary | ICD-10-CM | POA: Diagnosis not present

## 2023-10-14 DIAGNOSIS — I4891 Unspecified atrial fibrillation: Secondary | ICD-10-CM | POA: Diagnosis not present

## 2023-10-16 ENCOUNTER — Other Ambulatory Visit: Payer: Self-pay | Admitting: Internal Medicine

## 2023-10-29 DIAGNOSIS — Z961 Presence of intraocular lens: Secondary | ICD-10-CM | POA: Diagnosis not present

## 2023-10-29 DIAGNOSIS — H353123 Nonexudative age-related macular degeneration, left eye, advanced atrophic without subfoveal involvement: Secondary | ICD-10-CM | POA: Diagnosis not present

## 2023-10-29 DIAGNOSIS — Z0100 Encounter for examination of eyes and vision without abnormal findings: Secondary | ICD-10-CM | POA: Diagnosis not present

## 2023-10-29 DIAGNOSIS — H353211 Exudative age-related macular degeneration, right eye, with active choroidal neovascularization: Secondary | ICD-10-CM | POA: Diagnosis not present

## 2023-12-24 DIAGNOSIS — H353211 Exudative age-related macular degeneration, right eye, with active choroidal neovascularization: Secondary | ICD-10-CM | POA: Diagnosis not present

## 2024-01-06 ENCOUNTER — Ambulatory Visit: Payer: Medicare HMO | Admitting: *Deleted

## 2024-01-06 VITALS — Ht 61.0 in | Wt 159.2 lb

## 2024-01-06 DIAGNOSIS — Z0000 Encounter for general adult medical examination without abnormal findings: Secondary | ICD-10-CM

## 2024-01-06 NOTE — Progress Notes (Signed)
 Subjective:   Jenna Marshall is a 89 y.o. who presents for a Medicare Wellness preventive visit.  As a reminder, Annual Wellness Visits don't include a physical exam, and some assessments may be limited, especially if this visit is performed virtually. We may recommend an in-person follow-up visit with your provider if needed.  Visit Complete: Virtual I connected with  Jenna Marshall on 01/06/24 by a audio enabled telemedicine application and verified that I am speaking with the correct person using two identifiers.  Patient Location: Home  Provider Location: Home Office  I discussed the limitations of evaluation and management by telemedicine. The patient expressed understanding and agreed to proceed.  Vital Signs: Because this visit was a virtual/telehealth visit, some criteria may be missing or patient reported. Any vitals not documented were not able to be obtained and vitals that have been documented are patient reported.  VideoDeclined- This patient declined Librarian, academic. Therefore the visit was completed with audio only.  Persons Participating in Visit: Patient.  AWV Questionnaire: Yes: Patient Medicare AWV questionnaire was completed by the patient on 01/02/24; I have confirmed that all information answered by patient is correct and no changes since this date.  Cardiac Risk Factors include: advanced age (>46men, >83 women);dyslipidemia;hypertension;obesity (BMI >30kg/m2);Other (see comment), Risk factor comments: CAD     Objective:    Today's Vitals   01/06/24 0853  Weight: 159 lb 4 oz (72.2 kg)  Height: 5' 1 (1.549 m)   Body mass index is 30.09 kg/m.     01/06/2024    9:12 AM 03/01/2023    8:47 PM 01/01/2023   10:06 AM 12/17/2021    3:20 PM 12/13/2020   12:41 PM 08/30/2020    3:48 PM 12/13/2019    1:08 PM  Advanced Directives  Does Patient Have a Medical Advance Directive? Yes Yes Yes Yes Yes Yes Yes  Type of Special educational needs teacher of [place];Living will Healthcare Power of [place];Living will Healthcare Power of [employer];Living will Healthcare Power of [place];Living will Healthcare Power of [place];Living will Healthcare Power of [employer] of [place];Living will  Does patient want to make changes to medical advance directive? No - Patient declined  No - Patient declined No - Patient declined No - Patient declined  No - Patient declined  Copy of Healthcare Power of Attorney in Chart? Yes - validated most recent copy scanned in chart (See row information)  Yes - validated most recent copy scanned in chart (See row information) Yes - validated most recent copy scanned in chart (See row information) Yes - validated most recent copy scanned in chart (See row information) No - copy requested Yes - validated most recent copy scanned in chart (See row information)    Current Medications (verified) Outpatient Encounter Medications as of 01/06/2024  Medication Sig   acetaminophen  (TYLENOL ) 325 MG tablet Take 2 tablets (650 mg total) by mouth every 6 (six) hours as needed for mild pain.   aspirin  81 MG chewable tablet Chew 1 tablet (81 mg total) by mouth daily.   atorvastatin  (LIPITOR) 40 MG tablet TAKE 1 TABLET(40 MG) BY MOUTH DAILY   Calcium  Carbonate-Vitamin D  600-200 MG-UNIT TABS Take 1 tablet by mouth 2 (two) times daily.   carvedilol  (COREG ) 3.125 MG tablet TAKE 1 TABLET(3.125 MG) BY MOUTH TWICE DAILY WITH A MEAL   diclofenac  Sodium (VOLTAREN ) 1 % GEL Apply 2-4 g topically 4 (four) times daily as needed (pain).   docusate sodium  (COLACE)  100 MG capsule Take 1 capsule (100 mg total) by mouth 2 (two) times daily.   levothyroxine  (SYNTHROID ) 25 MCG tablet TAKE 1 TABLET(25 MCG) BY MOUTH DAILY BEFORE BREAKFAST   losartan  (COZAAR ) 100 MG tablet Take 1 tablet (100 mg total) by mouth daily.   mirabegron  ER (MYRBETRIQ ) 25 MG TB24 tablet Take 1 tablet (25 mg total) by mouth daily.   Multiple Vitamin  (MULTIVITAMIN WITH MINERALS) TABS tablet Take 1 tablet by mouth daily.   ondansetron  (ZOFRAN ) 4 MG tablet Take 1 tablet (4 mg total) by mouth every 8 (eight) hours as needed for nausea or vomiting.   pantoprazole  (PROTONIX ) 40 MG tablet TAKE 1 TABLET(40 MG) BY MOUTH DAILY   polycarbophil (FIBERCON) 625 MG tablet Take 625 mg by mouth daily.   polyethylene glycol (MIRALAX  / GLYCOLAX ) 17 g packet Take 17 g by mouth daily.   torsemide  (DEMADEX ) 10 MG tablet TAKE 1 TABLET BY MOUTH EVERY DAY AS NEEDED FOR SHORTNESS OF BREATH OR WEIGHT GAIN 3 LBS OR GREATER OVERNIGHT   [DISCONTINUED] citalopram (CELEXA) 20 MG tablet Take 20 mg by mouth daily.     No facility-administered encounter medications on file as of 01/06/2024.    Allergies (verified) Ambien  [zolpidem ], Codeine, and Lasix  [furosemide ]   History: Past Medical History:  Diagnosis Date   Aortic insufficiency    a. 03/2017 Echo: Mod AI; b. 02/2019 Echo: Mod AI.   Arthritis    CAD (coronary artery disease)    a. Lexiscan  10/13/14: mid anterior to apical & inf wall ischemia w/ WMA, mild to mod dep EF; b. Cath 10/2014: LM 50ost, LM 99, ost LAD 95, 40p/m, RCA 30p, 28m; b. 10/2014 CABG x 4 (LIMA->LAD, VG->RCA, VG->RI->OM).   Depression    Diastolic dysfunction    a. 10/2014 Echo: EF 55-60%, no RWMA, GR1DD; b. 03/2017 Echo: EF 60-65%, mild LVH, Gr2 DD; c. 02/2019 Echo: EF 55-60%, sev LVH. Diast dysfxn. No rwma. RVSP 41.25mmHg. Mildly dil LA. Mild to mod MR. Mod TR. Mod AI.   GERD (gastroesophageal reflux disease)    Hyperlipidemia    Hypertension    Hypothyroidism    Macular degeneration    pernicious anemia    Varicose veins    Wears dentures    partial upper   Wears hearing aid    right   Past Surgical History:  Procedure Laterality Date   ANTERIOR VITRECTOMY Right 07/16/2016   Procedure: ANTERIOR VITRECTOMY;  Surgeon: Adine Oneil Novak, MD;  Location: [facility];  Service: Ophthalmology;  Laterality: Right;   CARDIAC CATHETERIZATION  Left 10/14/2014   Procedure: Left Heart Cath and Coronary Angiography;  Surgeon: Deatrice DELENA Cage, MD;  Location: [facility] INVASIVE CV LAB;  Service: Cardiovascular;  Laterality: Left;   CATARACT EXTRACTION W/PHACO Left 04/30/2016   Procedure: CATARACT EXTRACTION PHACO AND INTRAOCULAR LENS PLACEMENT (IOC);  Surgeon: Adine Oneil Novak, MD;  Location: [facility];  Service: Ophthalmology;  Laterality: Left;  LEFT   CATARACT EXTRACTION W/PHACO Right 07/16/2016   Procedure: CATARACT EXTRACTION PHACO AND INTRAOCULAR LENS PLACEMENT (IOC)  Right;  Surgeon: Adine Oneil Novak, MD;  Location: [facility];  Service: Ophthalmology;  Laterality: Right;   CORONARY ARTERY BYPASS GRAFT N/A 10/14/2014   Procedure: CORONARY ARTERY BYPASS GRAFTING (CABG), ON PUMP, TIMES FOUR, USING LEFT INTERNAL MAMMARY ARTERY, RIGHT GREATER SAPHENOUS VEIN HARVESTED ENDOSCOPICALLY;  Surgeon: Dorise MARLA Fellers, MD;  Location: [facility] OR;  Service: Open Heart Surgery;  Laterality: N/A;   CORONARY ARTERY BYPASS GRAFT  10/2014  LIMA-->LAD, SVG-->RCA, sequential SVG-->Ramus and OM   HEMORRHOID SURGERY     HIP ARTHROPLASTY Left 01/14/2015   Procedure: ARTHROPLASTY BIPOLAR HIP (HEMIARTHROPLASTY);  Surgeon: Lynwood SHAUNNA Hue, MD;  Location: [facility] ORS;  Service: Orthopedics;  Laterality: Left;   VARICOSE VEIN SURGERY     Family History  Problem Relation Age of Onset   Coronary artery disease Mother    Diabetes Mother    Heart disease Father    Coronary artery disease Brother    Breast cancer Neg Hx    Social History   Socioeconomic History   Marital status: Widowed    Spouse name: Not on file   Number of children: Not on file   Years of education: Not on file   Highest education level: 12th grade  Occupational History   Not on file  Tobacco Use   Smoking status: Never   Smokeless tobacco: Never  Vaping Use   Vaping status: Never Used  Substance and Sexual Activity   Alcohol use: No   Drug use: No   Sexual activity: Never   Other Topics Concern   Not on file  Social History Narrative   ** Merged History Encounter **       Social Drivers of Health   Financial Resource Strain: Low Risk  (01/02/2024)   Overall Financial Resource Strain (CARDIA)    Difficulty of Paying Living Expenses: Not very hard  Food Insecurity: No Food Insecurity (01/02/2024)   Hunger Vital Sign    Worried About Running Out of Food in the Last Year: Never true    Ran Out of Food in the Last Year: Never true  Transportation Needs: No Transportation Needs (01/02/2024)   PRAPARE - Administrator, Civil Service (Medical): No    Lack of Transportation (Non-Medical): No  Physical Activity: Inactive (01/02/2024)   Exercise Vital Sign    Days of Exercise per Week: 0 days    Minutes of Exercise per Session: 0 min  Stress: No Stress Concern Present (01/02/2024)   [employer] of Occupational Health - Occupational Stress Questionnaire    Feeling of Stress: Not at all  Social Connections: Socially Isolated (01/02/2024)   Social Connection and Isolation Panel    Frequency of Communication with Friends and Family: More than three times a week    Frequency of Social Gatherings with Friends and Family: Three times a week    Attends Religious Services: Never    Active Member of Clubs or Organizations: No    Attends Banker Meetings: Not on file    Marital Status: Widowed    Tobacco Counseling Counseling given: Not Answered    Clinical Intake:  Pre-visit preparation completed: Yes  Pain : No/denies pain     BMI - recorded: 30.09 Nutritional Status: BMI > 30  Obese Nutritional Risks: None Diabetes: No  Lab Results  Component Value Date   HGBA1C 5.9 08/25/2023   HGBA1C 6.0 01/25/2022     How often do you need to have someone help you when you read instructions, pamphlets, or other written materials from your doctor or pharmacy?: 1 - Never  Interpreter Needed?: No  Information entered by :: R. Edgar Corrigan  LPN   Activities of Daily Living     01/02/2024    9:06 AM  In your present state of health, do you have any difficulty performing the following activities:  Hearing? 1   Vision? 1  Difficulty concentrating or making decisions? 0   Walking or  climbing stairs? 1   Dressing or bathing? 1   Doing errands, shopping? 1   Preparing Food and eating ? N   Using the Toilet? N   In the past six months, have you accidently leaked urine? Y   Do you have problems with loss of bowel control? N   Managing your Medications? N   Managing your Finances? N   Housekeeping or managing your Housekeeping? N      Proxy-reported    Patient Care Team: Marylynn Verneita CROME, MD as PCP - General (Internal Medicine) Darron Deatrice LABOR, MD as PCP - Cardiology (Cardiology) Marylynn Verneita CROME, MD (Internal Medicine) Kathlynn Sharper, MD as Consulting Physician (Orthopedic Surgery)  I have updated your Care Teams any recent Medical Services you may have received from other providers in the past year.     Assessment:   This is a routine wellness examination for Trenyce.  Hearing/Vision screen Hearing Screening - Comments:: Wears aids Vision Screening - Comments:: readers   Goals Addressed             This Visit's Progress    Patient Stated       Wants to try to exercise some       Depression Screen     01/06/2024    9:02 AM 08/25/2023   11:45 AM 02/24/2023   11:43 AM 01/01/2023   10:02 AM 07/05/2022    9:43 AM 01/25/2022    9:35 AM 12/17/2021    3:17 PM  PHQ 2/9 Scores  PHQ - 2 Score 0 0 1 0 0 0 0  PHQ- 9 Score 1  3 0       Fall Risk     01/02/2024    9:06 AM 08/25/2023   11:45 AM 04/28/2023   12:00 PM 02/24/2023   11:43 AM 01/01/2023    9:59 AM  Fall Risk   Falls in the past year? 0  1 1 0 0  Number falls in past yr: 0  0 1 0 0  Injury with Fall? 0  0 0 0 0  Risk for fall due to : No Fall Risks History of fall(s) History of fall(s) No Fall Risks No Fall Risks  Follow up Falls evaluation  completed;Falls prevention discussed Falls evaluation completed Falls evaluation completed Falls evaluation completed Falls prevention discussed;Falls evaluation completed     Proxy-reported    MEDICARE RISK AT HOME:  Medicare Risk at Home Any stairs in or around the home?: (Proxy-Rptd) Yes If so, are there any without handrails?: (Proxy-Rptd) Yes Home free of loose throw rugs in walkways, pet beds, electrical cords, etc?: (Proxy-Rptd) Yes Adequate lighting in your home to reduce risk of falls?: (Proxy-Rptd) Yes Life alert?: (Proxy-Rptd) No Use of a cane, walker or w/c?: (Proxy-Rptd) Yes Grab bars in the bathroom?: (Proxy-Rptd) Yes Shower chair or bench in shower?: (Proxy-Rptd) Yes Elevated toilet seat or a handicapped toilet?: (Proxy-Rptd) Yes  TIMED UP AND GO:  Was the test performed?  No  Cognitive Function: 6CIT completed        01/06/2024    9:13 AM 01/01/2023   10:06 AM 12/13/2020    1:06 PM 12/13/2019    1:29 PM 12/10/2018   10:48 AM  6CIT Screen  What Year? 0 points 0 points 0 points 0 points 0 points  What month? 0 points 0 points 0 points 0 points 0 points  What time? 0 points 0 points 0 points  0 points  Count back from 20  0 points 2 points 0 points  0 points  Months in reverse 2 points 4 points  0 points 0 points  Repeat phrase 0 points 0 points     Total Score 2 points 6 points       Immunizations Immunization History  Administered Date(s) Administered    sv, Bivalent, Protein Subunit Rsvpref,pf (Abrysvo) 05/14/2022   Fluad Trivalent(High Dose 65+) 02/24/2023   Influenza Split 02/27/2011, 03/04/2012   Influenza, High Dose Seasonal PF 03/31/2013, 02/24/2018, 02/24/2019   Influenza,inj,Quad PF,6+ Mos 03/30/2014   Influenza-Unspecified 02/06/2016, 03/05/2017, 03/03/2020, 03/03/2021, 02/20/2022   Moderna Covid-19 Fall Seasonal Vaccine 66yrs & older 02/20/2022   PFIZER(Purple Top)SARS-COV-2 Vaccination 06/25/2019, 07/16/2019, 03/03/2020   Pfizer Covid-19 Vaccine  Bivalent Booster 31yrs & up 03/28/2021   Pneumococcal Conjugate-13 03/30/2014   Pneumococcal Polysaccharide-23 03/10/2009, 04/19/2015   Tdap 10/06/2013   Zoster Recombinant(Shingrix) 06/25/2018, 12/31/2018   Zoster, Live 03/11/2011    Screening Tests Health Maintenance  Topic Date Due   COVID-19 Vaccine (6 - 2024-25 season) 02/02/2023   DTaP/Tdap/Td (2 - Td or Tdap) 10/07/2023   Medicare Annual Wellness (AWV)  01/01/2024   INFLUENZA VACCINE  01/02/2024   Pneumococcal Vaccine: 50+ Years  Completed   DEXA SCAN  Completed   Zoster Vaccines- Shingrix  Completed   Hepatitis B Vaccines  Aged Out   HPV VACCINES  Aged Out   Meningococcal B Vaccine  Aged Out    Health Maintenance  Health Maintenance Due  Topic Date Due   COVID-19 Vaccine (6 - 2024-25 season) 02/02/2023   DTaP/Tdap/Td (2 - Td or Tdap) 10/07/2023   Medicare Annual Wellness (AWV)  01/01/2024   INFLUENZA VACCINE  01/02/2024   Health Maintenance Items Addressed: Discussed the need to update Tetanus vaccine and to update flu and covid vaccines annually. Patient declines a Dexa at her age.  Additional Screening:  Vision Screening: Recommended annual ophthalmology exams for early detection of glaucoma and other disorders of the eye. Up to date   [facility] Would you like a referral to an eye doctor? No    Dental Screening: Recommended annual dental exams for proper oral hygiene  Community Resource Referral / Chronic Care Management: CRR required this visit?  No   CCM required this visit?  No   Plan:    I have personally reviewed and noted the following in the patient's chart:   Medical and social history Use of alcohol, tobacco or illicit drugs  Current medications and supplements including opioid prescriptions. Patient is not currently taking opioid prescriptions. Functional ability and status Nutritional status Physical activity Advanced directives List of other physicians Hospitalizations, surgeries,  and ER visits in previous 12 months Vitals Screenings to include cognitive, depression, and falls Referrals and appointments  In addition, I have reviewed and discussed with patient certain preventive protocols, quality metrics, and best practice recommendations. A written personalized care plan for preventive services as well as general preventive health recommendations were provided to patient.   Angeline Fredericks, LPN   06/06/7972   After Visit Summary: (MyChart) Due to this being a telephonic visit, the after visit summary with patients personalized plan was offered to patient via MyChart   Notes: Nothing significant to report at this time.  Reviewed medications with patient which she stated that her daughter keeps up with her meds but nothing has changed. Tried to call patient's daughter and was not able to reach her

## 2024-01-06 NOTE — Patient Instructions (Signed)
 Ms. Casares , Thank you for taking time out of your busy schedule to complete your Annual Wellness Visit with me. I enjoyed our conversation and look forward to speaking with you again next year. I, as well as your care team,  appreciate your ongoing commitment to your health goals. Please review the following plan we discussed and let me know if I can assist you in the future. Your Game plan/ To Do List    Referrals: If you haven't heard from the office you've been referred to, please reach out to them at the phone provided.  Remember to update your tetanus vaccine at your pharmacy and update your flu and covid vaccines annually. Follow up Visits: We will see or speak with you next year for your Next Medicare AWV with our clinical staff 01/10/25 @ 11:30 Have you seen your provider in the last 6 months (3 months if uncontrolled diabetes)? Yes  Clinician Recommendations:  Aim for 30 minutes of exercise or brisk walking, 6-8 glasses of water, and 5 servings of fruits and vegetables each day.       This is a list of the screenings recommended for you:  Health Maintenance  Topic Date Due   COVID-19 Vaccine (6 - 2024-25 season) 02/02/2023   DTaP/Tdap/Td vaccine (2 - Td or Tdap) 10/07/2023   Flu Shot  01/02/2024   Medicare Annual Wellness Visit  01/05/2025   Pneumococcal Vaccine for age over 35  Completed   DEXA scan (bone density measurement)  Completed   Zoster (Shingles) Vaccine  Completed   Hepatitis B Vaccine  Aged Out   HPV Vaccine  Aged Out   Meningitis B Vaccine  Aged Out    Advanced directives: (In Chart) A copy of your advanced directives are scanned into your chart should your provider ever need it. Advance Care Planning is important because it:  [x]  Makes sure you receive the medical care that is consistent with your values, goals, and preferences  [x]  It provides guidance to your family and loved ones and reduces their decisional burden about whether or not they are making the  right decisions based on your wishes.

## 2024-01-27 ENCOUNTER — Other Ambulatory Visit: Payer: Self-pay

## 2024-01-27 MED ORDER — ATORVASTATIN CALCIUM 40 MG PO TABS: ORAL_TABLET | ORAL | 0 refills | Status: DC

## 2024-02-11 DIAGNOSIS — H353211 Exudative age-related macular degeneration, right eye, with active choroidal neovascularization: Secondary | ICD-10-CM | POA: Diagnosis not present

## 2024-02-22 ENCOUNTER — Other Ambulatory Visit: Payer: Self-pay | Admitting: Internal Medicine

## 2024-03-02 ENCOUNTER — Ambulatory Visit: Admitting: Internal Medicine

## 2024-03-02 ENCOUNTER — Encounter: Payer: Self-pay | Admitting: Internal Medicine

## 2024-03-02 VITALS — BP 132/70 | HR 67 | Ht 61.0 in | Wt 159.0 lb

## 2024-03-02 DIAGNOSIS — I5032 Chronic diastolic (congestive) heart failure: Secondary | ICD-10-CM | POA: Diagnosis not present

## 2024-03-02 DIAGNOSIS — E785 Hyperlipidemia, unspecified: Secondary | ICD-10-CM | POA: Diagnosis not present

## 2024-03-02 DIAGNOSIS — R531 Weakness: Secondary | ICD-10-CM

## 2024-03-02 DIAGNOSIS — I10 Essential (primary) hypertension: Secondary | ICD-10-CM

## 2024-03-02 DIAGNOSIS — R7301 Impaired fasting glucose: Secondary | ICD-10-CM | POA: Diagnosis not present

## 2024-03-02 DIAGNOSIS — D508 Other iron deficiency anemias: Secondary | ICD-10-CM

## 2024-03-02 DIAGNOSIS — H353211 Exudative age-related macular degeneration, right eye, with active choroidal neovascularization: Secondary | ICD-10-CM | POA: Insufficient documentation

## 2024-03-02 DIAGNOSIS — Z23 Encounter for immunization: Secondary | ICD-10-CM

## 2024-03-02 DIAGNOSIS — E039 Hypothyroidism, unspecified: Secondary | ICD-10-CM

## 2024-03-02 DIAGNOSIS — I2510 Atherosclerotic heart disease of native coronary artery without angina pectoris: Secondary | ICD-10-CM

## 2024-03-02 DIAGNOSIS — J9601 Acute respiratory failure with hypoxia: Secondary | ICD-10-CM | POA: Insufficient documentation

## 2024-03-02 MED ORDER — LEVOTHYROXINE SODIUM 25 MCG PO TABS: ORAL_TABLET | ORAL | 1 refills | Status: AC

## 2024-03-02 MED ORDER — ATORVASTATIN CALCIUM 40 MG PO TABS: ORAL_TABLET | ORAL | 3 refills | Status: AC

## 2024-03-02 MED ORDER — CARVEDILOL 3.125 MG PO TABS: ORAL_TABLET | ORAL | 1 refills | Status: AC

## 2024-03-02 MED ORDER — PANTOPRAZOLE SODIUM 40 MG PO TBEC: DELAYED_RELEASE_TABLET | ORAL | 3 refills | Status: AC

## 2024-03-02 NOTE — Patient Instructions (Signed)
 We need to keep your blood pressure  BELOW 130/80 to keep your heart from having to work harder  PLEASE CHECK IT 5 TIMES OVER THE NEXT 2 WEEKS AND SEND ME THE READINGS     You are DUE for your tetanus-diptheria-pertussis vaccine   (TDaP)   Please get this done at your pharmacy l;  it will be PAID FOR MY MEDICARE ONLY AT YOUR PHARMACY

## 2024-03-02 NOTE — Assessment & Plan Note (Signed)
 Lipids are at goal. (LDL  70).  No changes to regimen   Lab Results  Component Value Date   CHOL 126 08/25/2023   HDL 51.10 08/25/2023   LDLCALC 59 08/25/2023   LDLDIRECT 60.0 08/25/2023   TRIG 83.0 08/25/2023   CHOLHDL 2 08/25/2023

## 2024-03-02 NOTE — Assessment & Plan Note (Addendum)
 Under treatment by Jenna Marshall .  She anticipates the start of a new medication soon .  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

## 2024-03-02 NOTE — Progress Notes (Signed)
 Subjective:  Patient ID: Jenna Marshall, female    DOB: 02/18/1926  Age: 89 y.o. MRN: [id number]  CC: The primary encounter diagnosis was Elevated blood pressure reading with diagnosis of hypertension. Diagnoses of Acquired hypothyroidism, Hyperlipidemia, unspecified hyperlipidemia type, Other iron  deficiency anemia, Impaired fasting glucose, Exudative age-related macular degeneration, right eye, with active choroidal neovascularization (HCC), Chronic diastolic heart failure (HCC), Need for influenza vaccination, Coronary artery disease involving native coronary artery of native heart without angina pectoris, and General weakness were also pertinent to this visit.   HPI Kenzie Thoreson presents for  Chief Complaint  Patient presents with   Medical Management of Chronic Issues    6 month follow up    Accompanied by her son Ubaldo.   1) Diastolic heart failure : taking carvedilol  and cozaar . No longer taking diuretics on a regular basis  weighing daily and takes a diruetci for wet gain of 2 lbs overnight . BP is high ,  not checking at home    2)  mobility: using rolling  walker  bathroom is equipped with safety bars   no falls  3) exudative MD of both eyes.  Left eye vision  is nearly gone,  waiting for a new med to be tried on right eye.  Can still read the paper with her glasses . Family fills her pill box   Outpatient Medications Prior to Visit  Medication Sig Dispense Refill   acetaminophen  (TYLENOL ) 325 MG tablet Take 2 tablets (650 mg total) by mouth every 6 (six) hours as needed for mild pain.     aspirin  81 MG chewable tablet Chew 1 tablet (81 mg total) by mouth daily. 30 tablet 0   Calcium  Carbonate-Vitamin D  600-200 MG-UNIT TABS Take 1 tablet by mouth 2 (two) times daily.     docusate sodium  (COLACE) 100 MG capsule Take 1 capsule (100 mg total) by mouth 2 (two) times daily.     FIBER GUMMIES PO Take 4 g by mouth daily.     losartan  (COZAAR ) 100 MG tablet TAKE 1 TABLET(100 MG)  BY MOUTH DAILY 90 tablet 1   Multiple Vitamin (MULTIVITAMIN WITH MINERALS) TABS tablet Take 1 tablet by mouth daily.     Multiple Vitamins-Minerals (PRESERVISION AREDS 2 PO) Take 1 tablet by mouth daily.     MYRBETRIQ  25 MG TB24 tablet TAKE 1 TABLET(25 MG) BY MOUTH DAILY 90 tablet 1   atorvastatin  (LIPITOR) 40 MG tablet TAKE 1 TABLET(40 MG) BY MOUTH DAILY 90 tablet 0   carvedilol  (COREG ) 3.125 MG tablet TAKE 1 TABLET(3.125 MG) BY MOUTH TWICE DAILY WITH A MEAL 180 tablet 1   levothyroxine  (SYNTHROID ) 25 MCG tablet TAKE 1 TABLET(25 MCG) BY MOUTH DAILY BEFORE BREAKFAST 90 tablet 1   pantoprazole  (PROTONIX ) 40 MG tablet TAKE 1 TABLET(40 MG) BY MOUTH DAILY 90 tablet 3   citalopram (CELEXA) 20 MG tablet Take 20 mg by mouth daily.       diclofenac  Sodium (VOLTAREN ) 1 % GEL Apply 2-4 g topically 4 (four) times daily as needed (pain).     ondansetron  (ZOFRAN ) 4 MG tablet Take 1 tablet (4 mg total) by mouth every 8 (eight) hours as needed for nausea or vomiting. 20 tablet 0   polycarbophil (FIBERCON) 625 MG tablet Take 625 mg by mouth daily.     polyethylene glycol (MIRALAX  / GLYCOLAX ) 17 g packet Take 17 g by mouth daily. (Patient not taking: Reported on 03/02/2024)     torsemide  (DEMADEX ) 10 MG tablet  TAKE 1 TABLET BY MOUTH EVERY DAY AS NEEDED FOR SHORTNESS OF BREATH OR WEIGHT GAIN 3 LBS OR GREATER OVERNIGHT (Patient not taking: Reported on 03/02/2024) 30 tablet 0   No facility-administered medications prior to visit.    Review of Systems;  Patient denies headache, fevers, malaise, unintentional weight loss, skin rash, eye pain, sinus congestion and sinus pain, sore throat, dysphagia,  hemoptysis , cough, dyspnea, wheezing, chest pain, palpitations, orthopnea, edema, abdominal pain, nausea, melena, diarrhea, constipation, flank pain, dysuria, hematuria, urinary  Frequency, nocturia, numbness, tingling, seizures,  Focal weakness, Loss of consciousness,  Tremor, insomnia, depression, anxiety, and suicidal  ideation.      Objective:  BP 132/70   Pulse 67   Ht 5' 1 (1.549 m)   Wt 159 lb (72.1 kg)   SpO2 97%   BMI 30.04 kg/m   BP Readings from Last 3 Encounters:  03/02/24 132/70  08/25/23 134/76  04/28/23 122/72    Wt Readings from Last 3 Encounters:  03/02/24 159 lb (72.1 kg)  01/06/24 159 lb 4 oz (72.2 kg)  08/25/23 155 lb 12.8 oz (70.7 kg)    Physical Exam Vitals reviewed.  Constitutional:      General: She is not in acute distress.    Appearance: Normal appearance. She is normal weight. She is not ill-appearing, toxic-appearing or diaphoretic.  HENT:     Head: Normocephalic.  Eyes:     General: No scleral icterus.       Right eye: No discharge.        Left eye: No discharge.     Conjunctiva/sclera: Conjunctivae normal.  Cardiovascular:     Rate and Rhythm: Normal rate and regular rhythm.     Heart sounds: Normal heart sounds.  Pulmonary:     Effort: Pulmonary effort is normal. No respiratory distress.     Breath sounds: Normal breath sounds.  Musculoskeletal:        General: Normal range of motion.  Skin:    General: Skin is warm and dry.  Neurological:     General: No focal deficit present.     Mental Status: She is alert and oriented to person, place, and time. Mental status is at baseline.  Psychiatric:        Mood and Affect: Mood normal.        Behavior: Behavior normal.        Thought Content: Thought content normal.        Judgment: Judgment normal.     Lab Results  Component Value Date   HGBA1C 5.9 08/25/2023   HGBA1C 6.0 01/25/2022    Lab Results  Component Value Date   CREATININE 0.90 08/25/2023   CREATININE 0.69 03/07/2023   CREATININE 0.79 03/06/2023    Lab Results  Component Value Date   WBC 7.7 03/11/2023   HGB 10.8 (L) 03/11/2023   HCT 31.6 (L) 03/11/2023   PLT 237 03/11/2023   GLUCOSE 95 08/25/2023   CHOL 126 08/25/2023   TRIG 83.0 08/25/2023   HDL 51.10 08/25/2023   LDLDIRECT 60.0 08/25/2023   LDLCALC 59 08/25/2023    ALT 11 08/25/2023   AST 18 08/25/2023   NA 138 08/25/2023   K 4.5 08/25/2023   CL 101 08/25/2023   CREATININE 0.90 08/25/2023   BUN 26 (H) 08/25/2023   CO2 31 08/25/2023   TSH 2.59 08/25/2023   INR 1.64 (H) 10/14/2014   HGBA1C 5.9 08/25/2023   MICROALBUR 1.1 08/25/2023    ECHOCARDIOGRAM COMPLETE Result Date:  03/03/2023    ECHOCARDIOGRAM REPORT   Patient Name:   KEVONNA NOLTE Date of Exam: 03/03/2023 Medical Rec #:  [id number]       Height:       61.0 in Accession #:    [id number]      Weight:       158.7 lb Date of Birth:  [birth year]       BSA:          1.712 m Patient Age:    89 years        BP:           175/49 mmHg Patient Gender: F               HR:           64 bpm. Exam Location:  [facility] Procedure: 2D Echo, Cardiac Doppler and Color Doppler Indications:     Murmur R01.1  History:         Patient has prior history of Echocardiogram examinations, most                  recent 02/11/2019. CAD; Risk Factors:Hypertension.  Sonographer:     Christopher Furnace Referring Phys:  [id number] ELVAN SOR Diagnosing Phys: Deatrice Cage MD IMPRESSIONS  1. Left ventricular ejection fraction, by estimation, is 55 to 60%. The left ventricle has normal function. The left ventricle has no regional wall motion abnormalities. There is moderate left ventricular hypertrophy. Left ventricular diastolic parameters are consistent with Grade II diastolic dysfunction (pseudonormalization).  2. Right ventricular systolic function is normal. The right ventricular size is normal.  3. Left atrial size was mildly dilated.  4. The mitral valve is normal in structure. Mild to moderate mitral valve regurgitation. No evidence of mitral stenosis. Moderate mitral annular calcification.  5. Tricuspid valve regurgitation is mild to moderate.  6. The aortic valve is calcified. Aortic valve regurgitation is mild. Aortic valve sclerosis/calcification is present, without any evidence of aortic stenosis.  7. The inferior vena cava is normal in size with  greater than 50% respiratory variability, suggesting right atrial pressure of 3 mmHg. FINDINGS  Left Ventricle: Left ventricular ejection fraction, by estimation, is 55 to 60%. The left ventricle has normal function. The left ventricle has no regional wall motion abnormalities. The left ventricular internal cavity size was normal in size. There is  moderate left ventricular hypertrophy. Left ventricular diastolic parameters are consistent with Grade II diastolic dysfunction (pseudonormalization). Right Ventricle: The right ventricular size is normal. No increase in right ventricular wall thickness. Right ventricular systolic function is normal. Left Atrium: Left atrial size was mildly dilated. Right Atrium: Right atrial size was normal in size. Pericardium: There is no evidence of pericardial effusion. Mitral Valve: The mitral valve is normal in structure. There is moderate thickening of the mitral valve leaflet(s). There is moderate calcification of the mitral valve leaflet(s). Moderate mitral annular calcification. Mild to moderate mitral valve regurgitation. No evidence of mitral valve stenosis. MV peak gradient, 4.2 mmHg. The mean mitral valve gradient is 1.0 mmHg. Tricuspid Valve: The tricuspid valve is normal in structure. Tricuspid valve regurgitation is mild to moderate. No evidence of tricuspid stenosis. Aortic Valve: The aortic valve is calcified. Aortic valve regurgitation is mild. Aortic valve sclerosis/calcification is present, without any evidence of aortic stenosis. Aortic valve mean gradient measures 6.0 mmHg. Aortic valve peak gradient measures 10.3 mmHg. Aortic valve area, by VTI measures 1.06 cm. Pulmonic Valve: The pulmonic valve was normal in structure.  Pulmonic valve regurgitation is trivial. No evidence of pulmonic stenosis. Aorta: The aortic root is normal in size and structure. Venous: The inferior vena cava is normal in size with greater than 50% respiratory variability, suggesting right  atrial pressure of 3 mmHg. IAS/Shunts: No atrial level shunt detected by color flow Doppler.  LEFT VENTRICLE PLAX 2D LVIDd:         4.40 cm   Diastology LVIDs:         2.80 cm   LV e' medial:    5.22 cm/s LV PW:         1.50 cm   LV E/e' medial:  20.3 LV IVS:        1.83 cm   LV e' lateral:   7.51 cm/s LVOT diam:     2.00 cm   LV E/e' lateral: 14.1 LV SV:         37 LV SV Index:   21 LVOT Area:     3.14 cm  RIGHT VENTRICLE RV Basal diam:  3.20 cm RV Mid diam:    2.90 cm RV S prime:     12.20 cm/s TAPSE (M-mode): 1.7 cm LEFT ATRIUM             Index        RIGHT ATRIUM           Index LA diam:        4.30 cm 2.51 cm/m   RA Area:     14.50 cm LA Vol (A2C):   75.6 ml 44.16 ml/m  RA Volume:   33.50 ml  19.57 ml/m LA Vol (A4C):   73.6 ml 42.99 ml/m LA Biplane Vol: 75.0 ml 43.81 ml/m  AORTIC VALVE AV Area (Vmax):    1.23 cm AV Area (Vmean):   1.10 cm AV Area (VTI):     1.06 cm AV Vmax:           160.67 cm/s AV Vmean:          113.000 cm/s AV VTI:            0.348 m AV Peak Grad:      10.3 mmHg AV Mean Grad:      6.0 mmHg LVOT Vmax:         63.10 cm/s LVOT Vmean:        39.700 cm/s LVOT VTI:          0.117 m LVOT/AV VTI ratio: 0.34  AORTA Ao Root diam: 2.70 cm MITRAL VALVE                TRICUSPID VALVE MV Area (PHT): 5.23 cm     TR Peak grad:   39.4 mmHg MV Area VTI:   1.45 cm     TR Vmax:        314.00 cm/s MV Peak grad:  4.2 mmHg MV Mean grad:  1.0 mmHg     SHUNTS MV Vmax:       1.03 m/s     Systemic VTI:  0.12 m MV Vmean:      47.5 cm/s    Systemic Diam: 2.00 cm MV Decel Time: 145 msec MV E velocity: 106.00 cm/s MV A velocity: 70.70 cm/s MV E/A ratio:  1.50 Deatrice Cage MD Electronically signed by Deatrice Cage MD Signature Date/Time: 03/03/2023/5:13:37 PM    Final     Assessment & Plan:  .Elevated blood pressure reading with diagnosis of hypertension -     Comprehensive metabolic panel  with GFR  Acquired hypothyroidism -     TSH  Hyperlipidemia, unspecified hyperlipidemia type Assessment &  Plan: Lipids are at goal. (LDL  70).  No changes to regimen   Lab Results  Component Value Date   CHOL 126 08/25/2023   HDL 51.10 08/25/2023   LDLCALC 59 08/25/2023   LDLDIRECT 60.0 08/25/2023   TRIG 83.0 08/25/2023   CHOLHDL 2 08/25/2023     Orders: -     Lipid panel -     LDL cholesterol, direct  Other iron  deficiency anemia -     CBC with Differential/Platelet  Impaired fasting glucose -     Comprehensive metabolic panel with GFR -     Hemoglobin A1c  Exudative age-related macular degeneration, right eye, with active choroidal neovascularization [facility]) Assessment & Plan: Under treatment by Adine Novak .  She anticipates the start of a new medication soon .  She can read with univsion (left eye vision is near blind)   Chronic diastolic heart failure (HCC) Assessment & Plan: she reports compliance with medication regimen  but has an elevated reading today in office.  He has been asked to check his BP at work and  submit readings for evaluation. Renal function will be checked today    Need for influenza vaccination -     Flu vaccine HIGH DOSE PF(Fluzone Trivalent)  Coronary artery disease involving native coronary artery of native heart without angina pectoris Assessment & Plan: She has been asymptomatic  and declines continued follow up with cardiology .   Most recent ECHO Sept 2024 during hospitalization for COVID pneumonia  :  normal LVEF.  No significant valvular abnormalities.   Cont asa/ coreg / lipitor.   General weakness Assessment & Plan: Secondary to deconditioning. She has deferred home PT referral and prefers to resume previously p rescribed exercises   Other orders -     Atorvastatin  Calcium ; TAKE 1 TABLET(40 MG) BY MOUTH DAILY  Dispense: 90 tablet; Refill: 3 -     Carvedilol ; TAKE 1 TABLET(3.125 MG) BY MOUTH TWICE DAILY WITH A MEAL  Dispense: 180 tablet; Refill: 1 -     Levothyroxine  Sodium; TAKE 1 TABLET(25 MCG) BY MOUTH DAILY BEFORE BREAKFAST   Dispense: 90 tablet; Refill: 1 -     Pantoprazole  Sodium; TAKE 1 TABLET(40 MG) BY MOUTH DAILY  Dispense: 90 tablet; Refill: 3   Follow-up: Return in about 6 months (around 08/30/2024).   Verneita LITTIE Kettering, MD

## 2024-03-02 NOTE — Assessment & Plan Note (Signed)
 She has been asymptomatic  and declines continued follow up with cardiology .   Most recent ECHO Sept 2024 during hospitalization for COVID pneumonia  :  normal LVEF.  No significant valvular abnormalities.   Cont asa/ coreg/ lipitor.

## 2024-03-02 NOTE — Assessment & Plan Note (Signed)
Secondary to deconditioning. She has deferred home PT referral and prefers to resume previously p rescribed exercises

## 2024-03-02 NOTE — Assessment & Plan Note (Signed)
she reports compliance with medication regimen  but has an elevated reading today in office.  He has been asked to check his BP at work and  submit readings for evaluation. Renal function will be checked today  

## 2024-03-03 LAB — CBC WITH DIFFERENTIAL/PLATELET
Basophils Absolute: 0 K/uL (ref 0.0–0.1)
Basophils Relative: 0.8 % (ref 0.0–3.0)
Eosinophils Absolute: 0.2 K/uL (ref 0.0–0.7)
Eosinophils Relative: 2.5 % (ref 0.0–5.0)
HCT: 34.4 % — ABNORMAL LOW (ref 36.0–46.0)
Hemoglobin: 11.8 g/dL — ABNORMAL LOW (ref 12.0–15.0)
Lymphocytes Relative: 14.6 % (ref 12.0–46.0)
Lymphs Abs: 0.9 K/uL (ref 0.7–4.0)
MCHC: 34.4 g/dL (ref 30.0–36.0)
MCV: 95.6 fl (ref 78.0–100.0)
Monocytes Absolute: 0.6 K/uL (ref 0.1–1.0)
Monocytes Relative: 9.7 % (ref 3.0–12.0)
Neutro Abs: 4.6 K/uL (ref 1.4–7.7)
Neutrophils Relative %: 72.4 % (ref 43.0–77.0)
Platelets: 156 K/uL (ref 150.0–400.0)
RBC: 3.6 Mil/uL — ABNORMAL LOW (ref 3.87–5.11)
RDW: 14.4 % (ref 11.5–15.5)
WBC: 6.4 K/uL (ref 4.0–10.5)

## 2024-03-03 LAB — HEMOGLOBIN A1C: Hgb A1c MFr Bld: 5.8 % (ref 4.6–6.5)

## 2024-03-03 LAB — TSH: TSH: 2.59 u[IU]/mL (ref 0.35–5.50)

## 2024-03-04 ENCOUNTER — Other Ambulatory Visit
Admission: RE | Admit: 2024-03-04 | Discharge: 2024-03-04 | Disposition: A | Discharge status: Home / Self Care | Attending: Internal Medicine | Admitting: Internal Medicine

## 2024-03-04 ENCOUNTER — Other Ambulatory Visit: Payer: Self-pay | Admitting: Internal Medicine

## 2024-03-04 DIAGNOSIS — E875 Hyperkalemia: Secondary | ICD-10-CM | POA: Insufficient documentation

## 2024-03-04 LAB — BASIC METABOLIC PANEL WITH GFR
Anion gap: 9 (ref 5–15)
BUN: 28 mg/dL — ABNORMAL HIGH (ref 8–23)
CO2: 26 mmol/L (ref 22–32)
Calcium: 8.8 mg/dL — ABNORMAL LOW (ref 8.9–10.3)
Chloride: 101 mmol/L (ref 98–111)
Creatinine, Ser: 0.94 mg/dL (ref 0.44–1.00)
GFR, Estimated: 56 mL/min — ABNORMAL LOW (ref >60)
Glucose, Bld: 112 mg/dL — ABNORMAL HIGH (ref 70–99)
Potassium: 4.1 mmol/L (ref 3.5–5.1)
Sodium: 136 mmol/L (ref 135–145)

## 2024-03-04 LAB — LDL CHOLESTEROL, DIRECT: Direct LDL: 58 mg/dL

## 2024-03-05 ENCOUNTER — Ambulatory Visit: Payer: Self-pay | Admitting: Internal Medicine

## 2024-03-24 DIAGNOSIS — H353211 Exudative age-related macular degeneration, right eye, with active choroidal neovascularization: Secondary | ICD-10-CM | POA: Diagnosis not present

## 2024-04-13 ENCOUNTER — Telehealth: Payer: Self-pay | Admitting: Internal Medicine

## 2024-04-13 DIAGNOSIS — I10 Essential (primary) hypertension: Secondary | ICD-10-CM

## 2024-04-13 MED ORDER — AMLODIPINE BESYLATE 2.5 MG PO TABS: 2.5000 mg | ORAL_TABLET | Freq: Every day | ORAL | 1 refills | Status: AC

## 2024-04-13 NOTE — Telephone Encounter (Signed)
 Pt's daughter dropped off list of BP readings for Dr Marylynn. They're in the color folder up front

## 2024-04-13 NOTE — Assessment & Plan Note (Signed)
 Home readings have ranged from 135/62 to 158/84 over the last month.    Will recommend adding amlodipine  2.5 mg daily

## 2024-04-13 NOTE — Addendum Note (Signed)
 Addended by: MARYLYNN VERNEITA CROME on: 04/13/2024 05:45 PM   Modules accepted: Orders

## 2024-04-13 NOTE — Telephone Encounter (Signed)
 Placed in yellow results folder for review.

## 2024-04-14 NOTE — Telephone Encounter (Signed)
 Spoke with pt's son and advised him of the medication that Dr. Tullo would like for his mother to start on. Son gave a verbal understanding.

## 2024-05-05 DIAGNOSIS — H903 Sensorineural hearing loss, bilateral: Secondary | ICD-10-CM | POA: Diagnosis not present

## 2024-05-05 DIAGNOSIS — H6123 Impacted cerumen, bilateral: Secondary | ICD-10-CM | POA: Diagnosis not present

## 2024-08-30 ENCOUNTER — Ambulatory Visit: Admitting: Internal Medicine

## 2025-01-10 ENCOUNTER — Ambulatory Visit
